# Patient Record
Sex: Female | Born: 1940
Health system: Southern US, Community
[De-identification: ages and names within clinical notes are randomized; demographics above are authoritative.]

## PROBLEM LIST (undated history)

## (undated) DIAGNOSIS — I10 Essential (primary) hypertension: Secondary | ICD-10-CM

## (undated) DIAGNOSIS — N189 Chronic kidney disease, unspecified: Secondary | ICD-10-CM

## (undated) DIAGNOSIS — E785 Hyperlipidemia, unspecified: Secondary | ICD-10-CM

## (undated) DIAGNOSIS — C801 Malignant (primary) neoplasm, unspecified: Secondary | ICD-10-CM

## (undated) HISTORY — DX: Chronic kidney disease, unspecified: N18.9

## (undated) HISTORY — DX: Malignant (primary) neoplasm, unspecified: C80.1

## (undated) HISTORY — DX: Hyperlipidemia, unspecified: E78.5

---

## 1999-05-22 ENCOUNTER — Ambulatory Visit (HOSPITAL_COMMUNITY): Admission: RE | Admit: 1999-05-22 | Discharge: 1999-05-22 | Payer: Self-pay | Admitting: Family Medicine

## 1999-05-22 ENCOUNTER — Encounter: Payer: Self-pay | Admitting: Family Medicine

## 1999-09-22 ENCOUNTER — Ambulatory Visit: Admission: RE | Admit: 1999-09-22 | Discharge: 1999-09-22 | Payer: Self-pay | Admitting: Cardiovascular Disease

## 1999-09-22 ENCOUNTER — Encounter: Payer: Self-pay | Admitting: Cardiovascular Disease

## 2003-07-02 ENCOUNTER — Other Ambulatory Visit: Admission: RE | Admit: 2003-07-02 | Discharge: 2003-07-02 | Payer: Self-pay | Admitting: Gynecology

## 2004-07-02 ENCOUNTER — Other Ambulatory Visit: Admission: RE | Admit: 2004-07-02 | Discharge: 2004-07-02 | Payer: Self-pay | Admitting: Gynecology

## 2010-08-19 ENCOUNTER — Other Ambulatory Visit: Payer: Self-pay

## 2010-08-20 ENCOUNTER — Ambulatory Visit (HOSPITAL_BASED_OUTPATIENT_CLINIC_OR_DEPARTMENT_OTHER): Payer: Self-pay | Admitting: Oncology

## 2010-08-25 ENCOUNTER — Encounter
Admission: RE | Admit: 2010-08-25 | Discharge: 2010-08-25 | Payer: Self-pay | Source: Home / Self Care | Attending: Diagnostic Radiology | Admitting: Diagnostic Radiology

## 2010-08-26 ENCOUNTER — Encounter (HOSPITAL_BASED_OUTPATIENT_CLINIC_OR_DEPARTMENT_OTHER): Payer: MEDICARE | Admitting: Oncology

## 2010-08-26 DIAGNOSIS — C50919 Malignant neoplasm of unspecified site of unspecified female breast: Secondary | ICD-10-CM

## 2010-08-26 LAB — CBC WITH DIFFERENTIAL/PLATELET
BASO%: 0.3 % (ref 0.0–2.0)
Basophils Absolute: 0 10*3/uL (ref 0.0–0.1)
EOS%: 1.9 % (ref 0.0–7.0)
Eosinophils Absolute: 0.1 10*3/uL (ref 0.0–0.5)
HCT: 43.1 % (ref 34.8–46.6)
HGB: 14.7 g/dL (ref 11.6–15.9)
LYMPH%: 25.3 % (ref 14.0–49.7)
MCH: 30.7 pg (ref 25.1–34.0)
MCHC: 34.2 g/dL (ref 31.5–36.0)
MCV: 89.8 fL (ref 79.5–101.0)
MONO#: 0.4 10*3/uL (ref 0.1–0.9)
MONO%: 8 % (ref 0.0–14.0)
NEUT#: 3.4 10*3/uL (ref 1.5–6.5)
NEUT%: 64.5 % (ref 38.4–76.8)
Platelets: 145 10*3/uL (ref 145–400)
RBC: 4.8 10*6/uL (ref 3.70–5.45)
RDW: 14.1 % (ref 11.2–14.5)
WBC: 5.2 10*3/uL (ref 3.9–10.3)
lymph#: 1.3 10*3/uL (ref 0.9–3.3)

## 2010-08-26 LAB — COMPREHENSIVE METABOLIC PANEL
ALT: 23 U/L (ref 0–35)
AST: 24 U/L (ref 0–37)
Albumin: 3.7 g/dL (ref 3.5–5.2)
Alkaline Phosphatase: 80 U/L (ref 39–117)
BUN: 22 mg/dL (ref 6–23)
CO2: 27 mEq/L (ref 19–32)
Calcium: 9.4 mg/dL (ref 8.4–10.5)
Chloride: 101 mEq/L (ref 96–112)
Creatinine, Ser: 1.37 mg/dL — ABNORMAL HIGH (ref 0.40–1.20)
Glucose, Bld: 385 mg/dL — ABNORMAL HIGH (ref 70–99)
Potassium: 3.9 mEq/L (ref 3.5–5.3)
Sodium: 139 mEq/L (ref 135–145)
Total Bilirubin: 0.8 mg/dL (ref 0.3–1.2)
Total Protein: 6.9 g/dL (ref 6.0–8.3)

## 2010-08-26 LAB — CANCER ANTIGEN 27.29: CA 27.29: 38 U/mL (ref 0–39)

## 2010-08-31 ENCOUNTER — Other Ambulatory Visit: Payer: Self-pay | Admitting: Gynecology

## 2010-10-05 ENCOUNTER — Encounter (HOSPITAL_BASED_OUTPATIENT_CLINIC_OR_DEPARTMENT_OTHER): Payer: MEDICARE | Admitting: Oncology

## 2010-10-05 ENCOUNTER — Other Ambulatory Visit: Payer: Self-pay | Admitting: Oncology

## 2010-10-05 DIAGNOSIS — C50919 Malignant neoplasm of unspecified site of unspecified female breast: Secondary | ICD-10-CM

## 2010-10-05 LAB — CBC WITH DIFFERENTIAL/PLATELET
BASO%: 0.3 % (ref 0.0–2.0)
Basophils Absolute: 0 10*3/uL (ref 0.0–0.1)
EOS%: 2.4 % (ref 0.0–7.0)
Eosinophils Absolute: 0.1 10*3/uL (ref 0.0–0.5)
HCT: 42.2 % (ref 34.8–46.6)
HGB: 14.6 g/dL (ref 11.6–15.9)
LYMPH%: 26.2 % (ref 14.0–49.7)
MCH: 30.4 pg (ref 25.1–34.0)
MCHC: 34.5 g/dL (ref 31.5–36.0)
MCV: 88.2 fL (ref 79.5–101.0)
MONO#: 0.3 10*3/uL (ref 0.1–0.9)
MONO%: 6.8 % (ref 0.0–14.0)
NEUT#: 3.2 10*3/uL (ref 1.5–6.5)
NEUT%: 64.3 % (ref 38.4–76.8)
Platelets: 156 10*3/uL (ref 145–400)
RBC: 4.79 10*6/uL (ref 3.70–5.45)
RDW: 13.4 % (ref 11.2–14.5)
WBC: 5 10*3/uL (ref 3.9–10.3)
lymph#: 1.3 10*3/uL (ref 0.9–3.3)

## 2010-10-06 LAB — COMPREHENSIVE METABOLIC PANEL
ALT: 21 U/L (ref 0–35)
AST: 17 U/L (ref 0–37)
Alkaline Phosphatase: 86 U/L (ref 39–117)
Calcium: 9.7 mg/dL (ref 8.4–10.5)
Chloride: 99 mEq/L (ref 96–112)
Creatinine, Ser: 1.04 mg/dL (ref 0.40–1.20)

## 2010-10-16 ENCOUNTER — Encounter (HOSPITAL_BASED_OUTPATIENT_CLINIC_OR_DEPARTMENT_OTHER): Payer: MEDICARE | Admitting: Oncology

## 2010-10-16 DIAGNOSIS — C50919 Malignant neoplasm of unspecified site of unspecified female breast: Secondary | ICD-10-CM

## 2010-11-25 ENCOUNTER — Encounter (HOSPITAL_BASED_OUTPATIENT_CLINIC_OR_DEPARTMENT_OTHER): Payer: MEDICARE | Admitting: Oncology

## 2010-11-25 ENCOUNTER — Other Ambulatory Visit: Payer: Self-pay | Admitting: Oncology

## 2010-11-25 DIAGNOSIS — C50919 Malignant neoplasm of unspecified site of unspecified female breast: Secondary | ICD-10-CM

## 2010-11-25 LAB — CBC WITH DIFFERENTIAL/PLATELET
Basophils Absolute: 0 10*3/uL (ref 0.0–0.1)
EOS%: 2.4 % (ref 0.0–7.0)
Eosinophils Absolute: 0.1 10*3/uL (ref 0.0–0.5)
HCT: 41.7 % (ref 34.8–46.6)
HGB: 14.4 g/dL (ref 11.6–15.9)
LYMPH%: 28.9 % (ref 14.0–49.7)
MCH: 30.6 pg (ref 25.1–34.0)
MCV: 88.7 fL (ref 79.5–101.0)
MONO%: 11.1 % (ref 0.0–14.0)
NEUT#: 2.5 10*3/uL (ref 1.5–6.5)
NEUT%: 57.1 % (ref 38.4–76.8)
Platelets: 156 10*3/uL (ref 145–400)

## 2010-11-26 LAB — COMPREHENSIVE METABOLIC PANEL
ALT: 20 U/L (ref 0–35)
CO2: 26 mEq/L (ref 19–32)
Calcium: 9.5 mg/dL (ref 8.4–10.5)
Chloride: 101 mEq/L (ref 96–112)
Sodium: 139 mEq/L (ref 135–145)
Total Bilirubin: 0.6 mg/dL (ref 0.3–1.2)
Total Protein: 6.8 g/dL (ref 6.0–8.3)

## 2010-11-26 LAB — LACTATE DEHYDROGENASE: LDH: 178 U/L (ref 94–250)

## 2010-12-03 ENCOUNTER — Encounter: Payer: MEDICARE | Admitting: Oncology

## 2010-12-04 ENCOUNTER — Encounter (INDEPENDENT_AMBULATORY_CARE_PROVIDER_SITE_OTHER): Payer: Self-pay | Admitting: Surgery

## 2011-01-06 ENCOUNTER — Other Ambulatory Visit: Payer: Self-pay | Admitting: Interventional Cardiology

## 2011-01-21 ENCOUNTER — Other Ambulatory Visit: Payer: Self-pay | Admitting: Oncology

## 2011-01-21 DIAGNOSIS — C50911 Malignant neoplasm of unspecified site of right female breast: Secondary | ICD-10-CM

## 2011-02-04 ENCOUNTER — Ambulatory Visit
Admission: RE | Admit: 2011-02-04 | Discharge: 2011-02-04 | Disposition: A | Discharge status: Home / Self Care | Payer: Medicare Other | Source: Ambulatory Visit | Attending: Oncology | Admitting: Oncology

## 2011-02-04 DIAGNOSIS — C50911 Malignant neoplasm of unspecified site of right female breast: Secondary | ICD-10-CM

## 2011-02-04 MED ORDER — GADOBENATE DIMEGLUMINE 529 MG/ML IV SOLN
20.0000 mL | Freq: Once | INTRAVENOUS | Status: AC | PRN
  Administered 2011-02-04: 20 mL via INTRAVENOUS

## 2011-02-08 ENCOUNTER — Encounter (INDEPENDENT_AMBULATORY_CARE_PROVIDER_SITE_OTHER): Payer: MEDICARE | Admitting: Surgery

## 2011-02-11 ENCOUNTER — Other Ambulatory Visit: Payer: Self-pay | Admitting: Oncology

## 2011-02-11 ENCOUNTER — Encounter (HOSPITAL_BASED_OUTPATIENT_CLINIC_OR_DEPARTMENT_OTHER): Payer: Medicare Other | Admitting: Oncology

## 2011-02-11 DIAGNOSIS — C50919 Malignant neoplasm of unspecified site of unspecified female breast: Secondary | ICD-10-CM

## 2011-02-11 LAB — CBC WITH DIFFERENTIAL/PLATELET
Basophils Absolute: 0 10*3/uL (ref 0.0–0.1)
EOS%: 2.6 % (ref 0.0–7.0)
HCT: 37.2 % (ref 34.8–46.6)
HGB: 13.1 g/dL (ref 11.6–15.9)
LYMPH%: 34 % (ref 14.0–49.7)
MCH: 31.2 pg (ref 25.1–34.0)
MCV: 88.9 fL (ref 79.5–101.0)
MONO%: 9.4 % (ref 0.0–14.0)
NEUT%: 53.4 % (ref 38.4–76.8)
Platelets: 143 10*3/uL — ABNORMAL LOW (ref 145–400)
lymph#: 1.4 10*3/uL (ref 0.9–3.3)

## 2011-02-11 LAB — CANCER ANTIGEN 27.29: CA 27.29: 29 U/mL (ref 0–39)

## 2011-02-11 LAB — COMPREHENSIVE METABOLIC PANEL
ALT: 23 U/L (ref 0–35)
Alkaline Phosphatase: 75 U/L (ref 39–117)
CO2: 22 mEq/L (ref 19–32)
Potassium: 3.6 mEq/L (ref 3.5–5.3)
Sodium: 139 mEq/L (ref 135–145)
Total Bilirubin: 0.3 mg/dL (ref 0.3–1.2)
Total Protein: 6.7 g/dL (ref 6.0–8.3)

## 2011-02-16 ENCOUNTER — Encounter (INDEPENDENT_AMBULATORY_CARE_PROVIDER_SITE_OTHER): Payer: Self-pay | Admitting: Surgery

## 2011-02-22 ENCOUNTER — Encounter (INDEPENDENT_AMBULATORY_CARE_PROVIDER_SITE_OTHER): Payer: Self-pay | Admitting: Surgery

## 2011-02-23 ENCOUNTER — Encounter (INDEPENDENT_AMBULATORY_CARE_PROVIDER_SITE_OTHER): Payer: Self-pay | Admitting: Surgery

## 2011-03-01 ENCOUNTER — Encounter (INDEPENDENT_AMBULATORY_CARE_PROVIDER_SITE_OTHER): Payer: Self-pay | Admitting: Surgery

## 2011-03-01 ENCOUNTER — Ambulatory Visit (INDEPENDENT_AMBULATORY_CARE_PROVIDER_SITE_OTHER): Payer: Medicare Other | Admitting: Surgery

## 2011-03-01 ENCOUNTER — Other Ambulatory Visit (INDEPENDENT_AMBULATORY_CARE_PROVIDER_SITE_OTHER): Payer: Self-pay | Admitting: Surgery

## 2011-03-01 VITALS — BP 124/85 | HR 72 | Temp 98.0°F | Ht 65.0 in | Wt 201.6 lb

## 2011-03-01 DIAGNOSIS — C50911 Malignant neoplasm of unspecified site of right female breast: Secondary | ICD-10-CM | POA: Insufficient documentation

## 2011-03-01 DIAGNOSIS — C50919 Malignant neoplasm of unspecified site of unspecified female breast: Secondary | ICD-10-CM

## 2011-03-01 DIAGNOSIS — D0590 Unspecified type of carcinoma in situ of unspecified breast: Secondary | ICD-10-CM

## 2011-03-01 NOTE — Progress Notes (Signed)
[name] is a 70 y.o. female.    Chief Complaint  Patient presents with  . Breast Cancer Long Term Follow Up    HPI HPI She is here for another followup of her right breast cancer. She had locally advanced lobular carcinoma and is currently on Femara. Most recently she had an MRI of the breast which shows her to have an extensive response with now the tumor in the tail of Spence measuring only 7 mm. There were no enlarged lymph nodes. She currently has no complaints. She continues to see Dr. Truddie Coco  Past Medical History  Diagnosis Date  . Cancer   . Diabetes mellitus   . Hyperlipidemia     History reviewed. No pertinent past surgical history.  History reviewed. No pertinent family history.  Social History History  Substance Use Topics  . Smoking status: Never Smoker   . Smokeless tobacco: Never Used  . Alcohol Use: No    No Known Allergies  Current Outpatient Prescriptions  Medication Sig Dispense Refill  . B Complex-C (SUPER B COMPLEX PO) Take by mouth.        . bisoprolol-hydrochlorothiazide (ZIAC) 10-6.25 MG per tablet Take 1 tablet by mouth daily.        . Calcium Carbonate (CALTRATE 600 PO) Take by mouth.        . calcium-vitamin D (OSCAL WITH D) 500-200 MG-UNIT per tablet Take 1 tablet by mouth daily.        . furosemide (LASIX) 40 MG tablet Take 40 mg by mouth 2 (two) times daily.        Marland Kitchen glimepiride (AMARYL) 2 MG tablet Take 2 mg by mouth daily before breakfast.        . insulin aspart protamine-insulin aspart (NOVOLOG 70/30) (70-30) 100 UNIT/ML injection Inject 30 Units into the skin.        . rosuvastatin (CRESTOR) 10 MG tablet Take 10 mg by mouth daily.        . valsartan (DIOVAN) 320 MG tablet Take 320 mg by mouth daily.          Review of Systems ROS Review of systems is currently negative for fever or chest pain or shortness of breath Physical Exam Physical Exam  On physical examination, she still has skin dimpling near the axilla in detail and  stents of the right breast. There are no other palpable masses. This area is definitely smaller. There is no palpable adenopathy. Blood pressure 124/85, pulse 72, temperature 98 F (36.7 C), temperature source Temporal, height 5\' 5"  (1.651 m), weight 201 lb 9.6 oz (91.445 kg).  Assessment/Plan This is a patient with invasive lobular carcinoma of the right breast was responding well to neoadjuvant therapy. We'll now proceed with a lumpectomy and sentinel node biopsy of this area. I discussed risks of surgery which include but are not limited to bleeding, infection, having positive margins with need for further surgery, injury just surrounding structures including nerves, et Ronney Asters. She agrees and wishes to proceed. Surgery will be scheduled.  Mitsue Peery A 03/01/2011, 11:17 AM

## 2011-03-15 ENCOUNTER — Ambulatory Visit (HOSPITAL_COMMUNITY)
Admission: RE | Admit: 2011-03-15 | Discharge: 2011-03-15 | Disposition: A | Discharge status: Home / Self Care | Payer: Medicare Other | Source: Ambulatory Visit | Attending: Surgery | Admitting: Surgery

## 2011-03-15 ENCOUNTER — Other Ambulatory Visit (INDEPENDENT_AMBULATORY_CARE_PROVIDER_SITE_OTHER): Payer: Self-pay | Admitting: Surgery

## 2011-03-15 ENCOUNTER — Encounter (HOSPITAL_COMMUNITY)
Admission: RE | Admit: 2011-03-15 | Discharge: 2011-03-15 | Disposition: A | Discharge status: Home / Self Care | Payer: Medicare Other | Source: Ambulatory Visit | Attending: Surgery | Admitting: Surgery

## 2011-03-15 DIAGNOSIS — C50911 Malignant neoplasm of unspecified site of right female breast: Secondary | ICD-10-CM

## 2011-03-15 DIAGNOSIS — Z01812 Encounter for preprocedural laboratory examination: Secondary | ICD-10-CM | POA: Insufficient documentation

## 2011-03-15 DIAGNOSIS — C50919 Malignant neoplasm of unspecified site of unspecified female breast: Secondary | ICD-10-CM | POA: Insufficient documentation

## 2011-03-15 DIAGNOSIS — Z01811 Encounter for preprocedural respiratory examination: Secondary | ICD-10-CM | POA: Insufficient documentation

## 2011-03-15 DIAGNOSIS — Z01810 Encounter for preprocedural cardiovascular examination: Secondary | ICD-10-CM | POA: Insufficient documentation

## 2011-03-15 LAB — SURGICAL PCR SCREEN
MRSA, PCR: NEGATIVE
Staphylococcus aureus: NEGATIVE

## 2011-03-15 LAB — BASIC METABOLIC PANEL
CO2: 29 mEq/L (ref 19–32)
Calcium: 10.4 mg/dL (ref 8.4–10.5)
Chloride: 97 mEq/L (ref 96–112)
Glucose, Bld: 360 mg/dL — ABNORMAL HIGH (ref 70–99)
Potassium: 3.8 mEq/L (ref 3.5–5.1)
Sodium: 138 mEq/L (ref 135–145)

## 2011-03-15 LAB — CBC
HCT: 42.8 % (ref 36.0–46.0)
MCHC: 35.3 g/dL (ref 30.0–36.0)
MCV: 88.1 fL (ref 78.0–100.0)
RDW: 13 % (ref 11.5–15.5)

## 2011-03-26 ENCOUNTER — Ambulatory Visit (HOSPITAL_COMMUNITY)
Admission: RE | Admit: 2011-03-26 | Discharge: 2011-03-26 | Disposition: A | Discharge status: Home / Self Care | Payer: Medicare Other | Source: Ambulatory Visit | Attending: Surgery | Admitting: Surgery

## 2011-03-26 ENCOUNTER — Other Ambulatory Visit (INDEPENDENT_AMBULATORY_CARE_PROVIDER_SITE_OTHER): Payer: Self-pay | Admitting: Surgery

## 2011-03-26 DIAGNOSIS — Z01812 Encounter for preprocedural laboratory examination: Secondary | ICD-10-CM | POA: Insufficient documentation

## 2011-03-26 DIAGNOSIS — C50919 Malignant neoplasm of unspecified site of unspecified female breast: Secondary | ICD-10-CM

## 2011-03-26 DIAGNOSIS — I10 Essential (primary) hypertension: Secondary | ICD-10-CM | POA: Insufficient documentation

## 2011-03-26 DIAGNOSIS — Z01818 Encounter for other preprocedural examination: Secondary | ICD-10-CM | POA: Insufficient documentation

## 2011-03-26 DIAGNOSIS — E119 Type 2 diabetes mellitus without complications: Secondary | ICD-10-CM | POA: Insufficient documentation

## 2011-03-26 DIAGNOSIS — C50911 Malignant neoplasm of unspecified site of right female breast: Secondary | ICD-10-CM

## 2011-03-26 DIAGNOSIS — Z01810 Encounter for preprocedural cardiovascular examination: Secondary | ICD-10-CM | POA: Insufficient documentation

## 2011-03-26 DIAGNOSIS — K056 Periodontal disease, unspecified: Secondary | ICD-10-CM | POA: Insufficient documentation

## 2011-03-26 DIAGNOSIS — N289 Disorder of kidney and ureter, unspecified: Secondary | ICD-10-CM | POA: Insufficient documentation

## 2011-03-26 HISTORY — PX: BREAST LUMPECTOMY: SHX2

## 2011-03-26 LAB — GLUCOSE, CAPILLARY: Glucose-Capillary: 139 mg/dL — ABNORMAL HIGH (ref 70–99)

## 2011-03-26 MED ORDER — TECHNETIUM TC 99M SULFUR COLLOID FILTERED
1.0000 | Freq: Once | INTRAVENOUS | Status: AC | PRN
  Administered 2011-03-26: 1 via INTRADERMAL

## 2011-04-02 NOTE — Op Note (Signed)
[name], [name] NO.:  [id number]  MEDICAL RECORD NO.:  [id number]  LOCATION:  [facility]SC                         FACILITY:  [facility]  PHYSICIAN:  Coralie Keens, M.D. DATE OF BIRTH:  11-Sep-1940  DATE OF PROCEDURE:  03/26/2011 DATE OF DISCHARGE:                              OPERATIVE REPORT   PREOPERATIVE DIAGNOSES:  Right breast cancer.  POSTOPERATIVE DIAGNOSIS:  Right breast cancer.  PROCEDURE: 1. Right breast lumpectomy. 2. Right sentinel lymph node biopsy with injection of blue dye.  SURGEON:  Coralie Keens, MD  ANESTHESIA:  General and 0.5% Marcaine.  ESTIMATED BLOOD LOSS:  Minimal.  INDICATIONS:  [name] is a 70 year old female who has diagnosed in January 2012 with a right breast cancer.  Biopsies revealed an invasive mammary cancer with lobular features.  She was treated with neoadjuvant therapy and has had a significant response.  The masses in the tail of Spence of the right breast.  Decision had been made to proceed now with right breast lumpectomy and sentinel node biopsy.  FINDINGS:  The patient was found to have three sentinel lymph nodes which I removed 2 were hot and blue, one was hot only.  These were sent to pathology for evaluation.  PROCEDURE IN DETAIL:  The patient was brought to the operating room, identified as [name].  She was placed supine on the operative table and general anesthesia was induced.  Her right breast and axilla were then prepped and draped in usual sterile fashion.  The patient had skin dimpling almost at the axilla from the previous mass.  I did an elliptical incision around this area of dimpling and took this down into the breast tissue.  Again, her mass was in the tail of Spence.  She had quite large breasts.  I performed a very large lumpectomy going all way down to the chest wall as well as platysma, removing all the tissue in this area, the tissue itself was normal in texture.  Once the  entire lumpectomy specimen was just above the removed, I identified the sentinel nodes, two were blue and hot and I removed these easily and sent to pathology for evaluation, I then proceed with removal of the rest of the breast tissue.  I then marked all quadrants of the tissue with Marcaine paint.  I then identified a third sentinel lymph nodes underneath the pectoralis muscle and removed this as well  and sent to pathology.  This one was hot but not blue.  I then thoroughly irrigated the wound with normal saline.  I placed surgical clips in the chest wall margins of the lumpectomy site.  Hemostasis appeared to be achieved.  I then anesthetized the skin with Marcaine.  I closed the subcutaneous tissue with interrupted 3-0 Vicryl sutures and closed the skin with running 4-0 Monocryl. Steri-Strips, gauze and tape were then applied.  The patient tolerated the procedure well.  All counts were correct at the end of the procedure.  The patient was then extubated in the operating and taken in stable condition to recovery room.Coralie Keens, M.D.     DB/MEDQ  D:  03/26/2011  T:  03/26/2011  Job:  [id number]  Electronically Signed by Coralie Keens M.D. on 04/02/2011 12:36:50 PM

## 2011-04-06 ENCOUNTER — Encounter (INDEPENDENT_AMBULATORY_CARE_PROVIDER_SITE_OTHER): Payer: Self-pay | Admitting: Surgery

## 2011-04-06 ENCOUNTER — Ambulatory Visit (INDEPENDENT_AMBULATORY_CARE_PROVIDER_SITE_OTHER): Payer: Medicare Other | Admitting: Surgery

## 2011-04-06 VITALS — BP 160/96 | HR 76

## 2011-04-06 DIAGNOSIS — Z09 Encounter for follow-up examination after completed treatment for conditions other than malignant neoplasm: Secondary | ICD-10-CM

## 2011-04-06 NOTE — Progress Notes (Signed)
Subjective:     Patient ID: [name], female   DOB: November 16, 1940, 70 y.o.   MRN: [id number]  HPI She is here for her first postoperative visit status post right breast lumpectomy and sentinel lymph node biopsy. Again, the lumpectomy was almost in the axilla. She has been having a lot of discomfort from a seroma formation. Otherwise she is without complaints.  Review of Systems     Objective:   Physical Exam On examination, she indeed has a large seroma in the right axilla. I drained approximately 500 cc of stromal fluid. There was no evidence of infection.  The final pathology demonstrated 3 of the 4 lymph node removed had metastatic cancer. The skin margin was positive but the rest of the margins were negative. Again she remained strongly ER and PR positive.    Assessment:     Patient status post right breast lumpectomy and central node biopsy for invasive lobular cancer.    Plan:     At this point, we will proceed to the operating room for reexcision of the skin margin. I suspect she will need an axillary dissection. She is however going to be having radiation to the breast. I'm going to discuss this with Dr. Truddie Coco and tentatively schedule the surgery. I discussed the risks with her.

## 2011-04-09 ENCOUNTER — Encounter (INDEPENDENT_AMBULATORY_CARE_PROVIDER_SITE_OTHER): Payer: Self-pay | Admitting: Surgery

## 2011-04-09 ENCOUNTER — Ambulatory Visit (INDEPENDENT_AMBULATORY_CARE_PROVIDER_SITE_OTHER): Payer: Medicare Other | Admitting: Surgery

## 2011-04-09 VITALS — BP 134/96 | HR 72 | Temp 97.0°F

## 2011-04-09 DIAGNOSIS — Z09 Encounter for follow-up examination after completed treatment for conditions other than malignant neoplasm: Secondary | ICD-10-CM

## 2011-04-09 NOTE — Progress Notes (Signed)
Subjective:     Patient ID: [name], female   DOB: Apr 14, 1941, 70 y.o.   MRN: [id number]  HPI  She is back today to drain the recurrent seroma in her right axilla. It is causing her to have discomfort again.Review of Systems     Objective:   Physical Exam On exam, she indeed has a recurrent seroma. I inserted an 18-gauge needle and drained several 100 cc of fluid. It did not appear infected.    Assessment:       Patient status post right breast lumpectomy and sentinel node biopsy for invasive lobular cancer. Again she was node positive. I've discussed this with Dr. Truddie Coco. I am going to re\re excise the skin margin which was positive. I am going to also proceed with a complete axillary dissection. Plan:     Surgery is planned for next Wednesday. She will call me Monday if her seroma is again symptomatic.

## 2011-04-12 ENCOUNTER — Encounter (INDEPENDENT_AMBULATORY_CARE_PROVIDER_SITE_OTHER): Payer: Medicare Other | Admitting: Surgery

## 2011-04-12 ENCOUNTER — Ambulatory Visit (INDEPENDENT_AMBULATORY_CARE_PROVIDER_SITE_OTHER): Payer: Medicare Other | Admitting: Surgery

## 2011-04-12 VITALS — BP 132/80 | HR 68

## 2011-04-12 DIAGNOSIS — Z09 Encounter for follow-up examination after completed treatment for conditions other than malignant neoplasm: Secondary | ICD-10-CM

## 2011-04-12 NOTE — Progress Notes (Signed)
Subjective:     Patient ID: [name], female   DOB: 1940-11-29, 70 y.o.   MRN: [id number]  HPI Her seroma in the right axilla is uncomfortable again  Review of Systems     Objective:   Physical Exam    I drained approximately 180 cc of seroma fluid Assessment:     Patient with right breast cancer status post excision    Plan:     Surgery is planned for Wednesday

## 2011-04-14 ENCOUNTER — Ambulatory Visit (HOSPITAL_COMMUNITY)
Admission: RE | Admit: 2011-04-14 | Discharge: 2011-04-15 | Disposition: A | Discharge status: Home / Self Care | Payer: Medicare Other | Source: Ambulatory Visit | Attending: Surgery | Admitting: Surgery

## 2011-04-14 ENCOUNTER — Other Ambulatory Visit (INDEPENDENT_AMBULATORY_CARE_PROVIDER_SITE_OTHER): Payer: Self-pay | Admitting: Surgery

## 2011-04-14 DIAGNOSIS — C50919 Malignant neoplasm of unspecified site of unspecified female breast: Secondary | ICD-10-CM | POA: Insufficient documentation

## 2011-04-14 DIAGNOSIS — C773 Secondary and unspecified malignant neoplasm of axilla and upper limb lymph nodes: Secondary | ICD-10-CM | POA: Insufficient documentation

## 2011-04-14 DIAGNOSIS — Z794 Long term (current) use of insulin: Secondary | ICD-10-CM | POA: Insufficient documentation

## 2011-04-14 DIAGNOSIS — E119 Type 2 diabetes mellitus without complications: Secondary | ICD-10-CM | POA: Insufficient documentation

## 2011-04-14 DIAGNOSIS — E669 Obesity, unspecified: Secondary | ICD-10-CM | POA: Insufficient documentation

## 2011-04-14 DIAGNOSIS — I10 Essential (primary) hypertension: Secondary | ICD-10-CM | POA: Insufficient documentation

## 2011-04-14 LAB — BASIC METABOLIC PANEL
Chloride: 102 mEq/L (ref 96–112)
Creatinine, Ser: 1.28 mg/dL — ABNORMAL HIGH (ref 0.50–1.10)
GFR calc Af Amer: 50 mL/min — ABNORMAL LOW (ref >60)
Sodium: 141 mEq/L (ref 135–145)

## 2011-04-14 LAB — GLUCOSE, CAPILLARY
Glucose-Capillary: 219 mg/dL — ABNORMAL HIGH (ref 70–99)
Glucose-Capillary: 208 mg/dL — ABNORMAL HIGH (ref 70–99)

## 2011-04-14 LAB — CBC
Hemoglobin: 14 g/dL (ref 12.0–15.0)
MCH: 30.7 pg (ref 26.0–34.0)
Platelets: 174 10*3/uL (ref 150–400)
RBC: 4.56 MIL/uL (ref 3.87–5.11)
WBC: 5.6 10*3/uL (ref 4.0–10.5)

## 2011-04-14 LAB — SURGICAL PCR SCREEN
MRSA, PCR: NEGATIVE
Staphylococcus aureus: NEGATIVE

## 2011-04-15 LAB — GLUCOSE, CAPILLARY
Glucose-Capillary: 93 mg/dL (ref 70–99)
Glucose-Capillary: 123 mg/dL — ABNORMAL HIGH (ref 70–99)

## 2011-04-20 ENCOUNTER — Encounter (HOSPITAL_BASED_OUTPATIENT_CLINIC_OR_DEPARTMENT_OTHER): Payer: Medicare Other | Admitting: Oncology

## 2011-04-20 ENCOUNTER — Ambulatory Visit (INDEPENDENT_AMBULATORY_CARE_PROVIDER_SITE_OTHER): Payer: Medicare Other | Admitting: Surgery

## 2011-04-20 ENCOUNTER — Other Ambulatory Visit: Payer: Self-pay | Admitting: Oncology

## 2011-04-20 ENCOUNTER — Encounter (INDEPENDENT_AMBULATORY_CARE_PROVIDER_SITE_OTHER): Payer: Self-pay | Admitting: Surgery

## 2011-04-20 VITALS — BP 152/98 | HR 70 | Temp 96.9°F | Resp 16 | Ht 64.5 in | Wt 203.4 lb

## 2011-04-20 DIAGNOSIS — Z170 Estrogen receptor positive status [ER+]: Secondary | ICD-10-CM

## 2011-04-20 DIAGNOSIS — C50919 Malignant neoplasm of unspecified site of unspecified female breast: Secondary | ICD-10-CM

## 2011-04-20 DIAGNOSIS — Z09 Encounter for follow-up examination after completed treatment for conditions other than malignant neoplasm: Secondary | ICD-10-CM

## 2011-04-20 LAB — COMPREHENSIVE METABOLIC PANEL
Albumin: 3.8 g/dL (ref 3.5–5.2)
BUN: 18 mg/dL (ref 6–23)
Calcium: 9.1 mg/dL (ref 8.4–10.5)
Chloride: 104 mEq/L (ref 96–112)
Glucose, Bld: 262 mg/dL — ABNORMAL HIGH (ref 70–99)
Potassium: 3.7 mEq/L (ref 3.5–5.3)

## 2011-04-20 LAB — CANCER ANTIGEN 27.29: CA 27.29: 28 U/mL (ref 0–39)

## 2011-04-20 LAB — CBC WITH DIFFERENTIAL/PLATELET
Basophils Absolute: 0 10*3/uL (ref 0.0–0.1)
Eosinophils Absolute: 0.1 10*3/uL (ref 0.0–0.5)
HCT: 38.4 % (ref 34.8–46.6)
HGB: 13.5 g/dL (ref 11.6–15.9)
MCV: 89.1 fL (ref 79.5–101.0)
NEUT#: 2.5 10*3/uL (ref 1.5–6.5)
RDW: 13 % (ref 11.2–14.5)
lymph#: 1.4 10*3/uL (ref 0.9–3.3)

## 2011-04-20 LAB — VITAMIN D 25 HYDROXY (VIT D DEFICIENCY, FRACTURES): Vit D, 25-Hydroxy: 41 ng/mL (ref 30–89)

## 2011-04-20 NOTE — Progress Notes (Signed)
Subjective:     Patient ID: [name], female   DOB: November 02, 1940, 70 y.o.   MRN: [id number]  HPI She is here for her first postoperative visit status post reexcision of the skin of the right breast lumpectomy site as well as completion axillary dissection. She is doing well and has no complaints. The drain is still draining a moderate amount of serous fluid  Review of Systems     Objective:   Physical Exam    On exam, the incision is healing well without evidence of infection. The drain site is stable. The drain fluid is serous sanguinous. Again, the pathology showed no residual carcinoma in the skin removed. The lymph nodes removed were negative for malignancy. Assessment:     Patient status post reexcision of invasive lobular carcinoma of the right breast    Plan:     I will see her back next week to hopefully remove the drain

## 2011-04-26 NOTE — Op Note (Signed)
[name], [name] NO.:  [id number]  MEDICAL RECORD NO.:  [id number]  LOCATION:  [facility]SC                         FACILITY:  [facility]  PHYSICIAN:  Coralie Keens, M.D. DATE OF BIRTH:  Nov 23, 1940  DATE OF PROCEDURE:  04/14/2011 DATE OF DISCHARGE:                              OPERATIVE REPORT   PREOPERATIVE DIAGNOSIS:  Right breast cancer.  POSTOPERATIVE DIAGNOSIS:  Right breast cancer.  PROCEDURE: 1. Re-excision of right breast cancer. 2. Completion of right axillary lymph node dissection.  SURGEON:  Coralie Keens, MD.  ANESTHESIA:  General and 0.25% Marcaine.  ESTIMATED BLOOD LOSS:  Minimal.  INDICATION:  [name] is a 70 year old female who was found to have an invasive lobular right breast cancer.  She underwent neoadjuvant therapy and presented for lumpectomy and sentinel node biopsy.  She had positive skin margins at the lumpectomy site and 3 out of 4 lymph nodes positive for metastatic cancer.  Decision was made to reexcise the skin site and to proceed with a completion of right axillary dissection.  PROCEDURE IN DETAIL:  The patient was brought to the operating, identified as [name].  She was placed supine on the operating room table and general anesthesia was induced.  Her right axilla and breast were then prepped and draped in usual sterile fashion.  I performed a large elliptical incision in the axilla of the breast.  Her breast cancer itself was in the upper outer quadrant in the tail of Spence right adjacent to the axilla.  I had done a previous sentinel node biopsy through this incision as well.  I widely excised the skin around the previous scar with the electrocautery and took this down to the breast tissue with the cautery.  Once the skin of the anterior margin was excised, marked it with sutures medial and lateral.  The long suture was lateral.  I then drained seroma out of the breast cavity.  The skin was sent to pathology  for evaluation.  The patient had a moderate amount of scarring in the axilla, but I was able to easily develop a tissue plane between the axilla and the pectoralis minor muscle.  The patient had a wide of tissue in the axilla.  I was able to easily identify the axillary vein as long as the long thoracodorsal nerves despite the inflammatory reaction from the previous surgery.  Several small bridging veins were clipped with surgical clips as I dissected out the nodal package.  Once I removed the nodal package in its entirety, I again evaluate the neurovascular structures and they were all felt to be intact.  I palpated the axilla as well as underneath the pectoralis muscle and found no other enlarged lymph nodes.  The axillary specimen was then sent to pathology for evaluation.  I then thoroughly irrigated the axilla with normal saline.  Again, hemostasis appeared to be achieved.  I made a separate skin incision and placed a 19-French Blake drain into the axilla.  This incorporated the previous lumpectomy site as well.  I then reapproximated the subcutaneous tissue with interrupted 2-0 Vicryl sutures, anesthetized the skin with Marcaine and then closed the skin with 4-0 Monocryl.  Steri-Strips and Tegaderm were then applied.  The patient tolerated the procedure well.  All counts were correct at the end of procedure.  The patient was then extubated in the operating room and taken in stable condition to recovery room.     Coralie Keens, M.D.     DB/MEDQ  D:  04/14/2011  T:  04/14/2011  Job:  [id number]  Electronically Signed by Coralie Keens M.D. on 04/26/2011 09:10:32 AM

## 2011-04-27 ENCOUNTER — Encounter (INDEPENDENT_AMBULATORY_CARE_PROVIDER_SITE_OTHER): Payer: Self-pay | Admitting: Surgery

## 2011-04-27 ENCOUNTER — Ambulatory Visit (INDEPENDENT_AMBULATORY_CARE_PROVIDER_SITE_OTHER): Payer: Medicare Other | Admitting: Surgery

## 2011-04-27 VITALS — BP 156/100 | HR 80 | Temp 98.2°F | Resp 16 | Ht 64.5 in | Wt 202.6 lb

## 2011-04-27 DIAGNOSIS — Z09 Encounter for follow-up examination after completed treatment for conditions other than malignant neoplasm: Secondary | ICD-10-CM

## 2011-04-27 NOTE — Progress Notes (Signed)
Subjective:     Patient ID: [name], female   DOB: Feb 27, 1941, 70 y.o.   MRN: [id number]  HPI She is here for another postoperative visit status post mastectomy and axillary dissection. She is now draining minimal fluid in the right axillary during  Review of Systems     Objective:   Physical Exam On exam, her incision is healing well. I removed the drain    Assessment:     Patient status post reexcision right breast cancer and axillary dissection    Plan:     I will see her back in one week

## 2011-05-05 ENCOUNTER — Encounter (INDEPENDENT_AMBULATORY_CARE_PROVIDER_SITE_OTHER): Payer: Self-pay | Admitting: Surgery

## 2011-05-05 ENCOUNTER — Ambulatory Visit (INDEPENDENT_AMBULATORY_CARE_PROVIDER_SITE_OTHER): Payer: Medicare Other | Admitting: Surgery

## 2011-05-05 VITALS — BP 140/94 | HR 80 | Temp 96.8°F | Resp 20 | Ht 64.75 in | Wt 206.0 lb

## 2011-05-05 DIAGNOSIS — Z09 Encounter for follow-up examination after completed treatment for conditions other than malignant neoplasm: Secondary | ICD-10-CM

## 2011-05-05 NOTE — Progress Notes (Signed)
Subjective:     Patient ID: [name], female   DOB: May 21, 1941, 70 y.o.   MRN: [id number]  HPI She is here for another postoperative visit. She feels well with minimal discomfort of the breast.  Review of Systems     Objective:   Physical Exam On exam, the left breast is slightly erythematous in appearance it is not warm. I did insert an 18-gauge needle into the biopsy site and found no fluid    Assessment:     Patient status post reexcision of right breast cancer and completion axillary dissection    Plan:     Given the slight erythema, I am going to start her on Keflex 500 mg p.o. t.i.d. for the next 7 days. I will see her back next week for recheck

## 2011-05-11 ENCOUNTER — Encounter (HOSPITAL_BASED_OUTPATIENT_CLINIC_OR_DEPARTMENT_OTHER): Payer: Medicare Other | Admitting: Oncology

## 2011-05-11 DIAGNOSIS — C50919 Malignant neoplasm of unspecified site of unspecified female breast: Secondary | ICD-10-CM

## 2011-05-11 DIAGNOSIS — K8020 Calculus of gallbladder without cholecystitis without obstruction: Secondary | ICD-10-CM

## 2011-05-11 DIAGNOSIS — N61 Mastitis without abscess: Secondary | ICD-10-CM

## 2011-05-12 ENCOUNTER — Ambulatory Visit (INDEPENDENT_AMBULATORY_CARE_PROVIDER_SITE_OTHER): Payer: Medicare Other | Admitting: Surgery

## 2011-05-12 VITALS — BP 160/88 | HR 70 | Temp 96.9°F | Resp 16 | Ht 64.25 in | Wt 203.4 lb

## 2011-05-12 DIAGNOSIS — Z09 Encounter for follow-up examination after completed treatment for conditions other than malignant neoplasm: Secondary | ICD-10-CM

## 2011-05-12 NOTE — Progress Notes (Signed)
Subjective:     Patient ID: [name], female   DOB: 09/26/1940, 70 y.o.   MRN: [id number]  HPI  She is here for a followup of erythema of her right breast. She is finishing her antibiotics. She has no complaints.Review of Systems     Objective:   Physical Exam On exam, erythematous and was completely resolved. There is no warmth to the breast. The breast is nontender    Assessment:     Patient status post right breast lumpectomy and axillary dissection with mild postoperative cellulitis    Plan:     I will see her back in 2 weeks and with erythema recurs

## 2011-05-25 ENCOUNTER — Encounter (INDEPENDENT_AMBULATORY_CARE_PROVIDER_SITE_OTHER): Payer: Self-pay | Admitting: Surgery

## 2011-05-31 ENCOUNTER — Encounter (INDEPENDENT_AMBULATORY_CARE_PROVIDER_SITE_OTHER): Payer: Self-pay | Admitting: Surgery

## 2011-05-31 ENCOUNTER — Ambulatory Visit (INDEPENDENT_AMBULATORY_CARE_PROVIDER_SITE_OTHER): Payer: Medicare Other | Admitting: Surgery

## 2011-05-31 VITALS — BP 138/88 | HR 66 | Temp 98.0°F | Resp 18 | Wt 205.0 lb

## 2011-05-31 DIAGNOSIS — Z09 Encounter for follow-up examination after completed treatment for conditions other than malignant neoplasm: Secondary | ICD-10-CM

## 2011-05-31 NOTE — Progress Notes (Signed)
Subjective:     Patient ID: [name], female   DOB: Apr 16, 1941, 70 y.o.   MRN: [id number]  HPI She is here for a followup visit. She reports she still has intermittent discomfort in the right breast and there still some mild erythema of the breast  Review of Systems     Objective:   Physical Exam On exam, there is slight induration in the axillary incision but no erythema. There is erythema and warmth of the lower breast    Assessment:     Right breast postop cellulitis    Plan:     I do believe there is a mild infection but some of the erythema may be secondary to the large size of her breast hanging. Nonetheless, I'm going to resume doxycycline and see her back in 2 weeks.

## 2011-06-01 ENCOUNTER — Telehealth: Payer: Self-pay | Admitting: Oncology

## 2011-06-01 ENCOUNTER — Ambulatory Visit (HOSPITAL_BASED_OUTPATIENT_CLINIC_OR_DEPARTMENT_OTHER): Payer: Medicare Other | Admitting: Oncology

## 2011-06-01 ENCOUNTER — Other Ambulatory Visit (HOSPITAL_BASED_OUTPATIENT_CLINIC_OR_DEPARTMENT_OTHER): Payer: Medicare Other | Admitting: Lab

## 2011-06-01 ENCOUNTER — Other Ambulatory Visit: Payer: Self-pay | Admitting: Oncology

## 2011-06-01 DIAGNOSIS — C50919 Malignant neoplasm of unspecified site of unspecified female breast: Secondary | ICD-10-CM

## 2011-06-01 DIAGNOSIS — Z170 Estrogen receptor positive status [ER+]: Secondary | ICD-10-CM

## 2011-06-01 LAB — CBC WITH DIFFERENTIAL/PLATELET
BASO%: 0.4 % (ref 0.0–2.0)
Basophils Absolute: 0 10*3/uL (ref 0.0–0.1)
EOS%: 2 % (ref 0.0–7.0)
HCT: 40.8 % (ref 34.8–46.6)
LYMPH%: 33.5 % (ref 14.0–49.7)
MCH: 30.6 pg (ref 25.1–34.0)
MCHC: 34.3 g/dL (ref 31.5–36.0)
MCV: 89.2 fL (ref 79.5–101.0)
MONO%: 8.1 % (ref 0.0–14.0)
NEUT%: 56 % (ref 38.4–76.8)
Platelets: 152 10*3/uL (ref 145–400)

## 2011-06-01 LAB — COMPREHENSIVE METABOLIC PANEL
ALT: 19 U/L (ref 0–35)
AST: 18 U/L (ref 0–37)
BUN: 20 mg/dL (ref 6–23)
Creatinine, Ser: 1.26 mg/dL — ABNORMAL HIGH (ref 0.50–1.10)
Total Bilirubin: 0.4 mg/dL (ref 0.3–1.2)

## 2011-06-01 NOTE — Progress Notes (Signed)
   Hematology and Oncology Follow Up Visit  LYZETTE SCHIAVO [id number] 1941/07/21 70 y.o. 06/01/2011 5:33 PM   Principle Diagnosis: Node positive ER/PR positive lobular cancer status post lumpectomy and node dissection  Interim History:  Ms. pearlee mustapha for followup she continues to see Dr. Ninfa Linden who is seeing her for a wound infection. He has been in touch with Dr. Alroy Dust in [place] who will see her  next week regarding radiation. I reviewed the recent Oncotype test which showed a recurrent score of 18 corresponding to a risk of recurrence of 11%. I have also reassured her that the relative benefit of chemotherapy in this setting is not great. She is reassured and is anxious to proceed  with radiation. sHe will continue on Femara for the time being while she is  having radiations  Medications: I have reviewed the patient's current medications.  Allergies: No Known Allergies  Past Medical History, Surgical history, Social history, and Family History were reviewed and updated.  Review of Systems: Constitutional:  Negative for fever, chills, night sweats, anorexia, weight loss, pain. Cardiovascular: no chest pain or dyspnea on exertion Respiratory: no cough, shortness of breath, or wheezing Neurological: negative Dermatological: negative ENT: negative Skin Gastrointestinal: negative Genito-Urinary: negative Hematological and Lymphatic: negative Breast: negative for breast lumps Musculoskeletal: negative Remaining ROS negative.  Physical Exam: Blood pressure 175/93, pulse 70, temperature 98.4 F (36.9 C), temperature source Oral, height 5' 4.5" (1.638 m), weight 204 lb 4.8 oz (92.67 kg). ECOG: 0 General appearance: alert, Pt not fully examined Head:  Pulmonary: Breasts: Abdomen: Extremities Neuro:  Lab Results: Lab Results  Component Value Date   WBC 5.6 04/14/2011   HGB 14.0 06/01/2011   HCT 40.8 06/01/2011   MCV 89.2 06/01/2011   PLT 152 06/01/2011     Chemistry     Component Value Date/Time   NA 141 06/01/2011 0859   K 4.3 06/01/2011 0859   CL 102 06/01/2011 0859   CO2 26 06/01/2011 0859   BUN 20 06/01/2011 0859   CREATININE 1.26* 06/01/2011 0859      Component Value Date/Time   CALCIUM 9.8 06/01/2011 0859   ALKPHOS 90 06/01/2011 0859   AST 18 06/01/2011 0859   ALT 19 06/01/2011 0859   BILITOT 0.4 06/01/2011 0859       Radiological Studies: chest X-ray  Mammogram  Bone density   Impression and Plan: Once again I have emphasized that the overall prognosis is excellent. We will see her in 3 months time after she completes radiation. I have wished her well.  More than 50% of the visit was spent in patient-related counselling   Eston Esters, MD 11/6/20125:33 PM

## 2011-06-01 NOTE — Telephone Encounter (Signed)
mailed pts appt [email or web address] on 06/01/2011

## 2011-06-01 NOTE — Progress Notes (Signed)
    Hematology and Oncology Follow Up Visit  [name] [id number] 08/20/1940 70 y.o. 06/01/2011 5:44 PM   Principle Diagnosis: Node positive ER/PR positive lobular cancer status post lumpectomy and node dissection  Interim History:  [name] for followup she continues to see Dr. Ninfa Linden who is seeing her for a wound infection. He has been in touch with Dr. Alroy Dust in [place] who will see her  next week regarding radiation. I reviewed the recent Oncotype test which showed a recurrent score of 18 corresponding to a risk of recurrence of 11%. I have also reassured her that the relative benefit of chemotherapy in this setting is not great. She is reassured and is anxious to proceed  with radiation. sHe will continue on Femara for the time being while she is  having radiations  Medications: I have reviewed the patient's current medications.  Allergies: No Known Allergies  Past Medical History, Surgical history, Social history, and Family History were reviewed and updated.  Review of Systems: Constitutional:  Negative for fever, chills, night sweats, anorexia, weight loss, pain. Cardiovascular: no chest pain or dyspnea on exertion Respiratory: no cough, shortness of breath, or wheezing Neurological: negative Dermatological: negative ENT: negative Skin Gastrointestinal: negative Genito-Urinary: negative Hematological and Lymphatic: negative Breast: negative for breast lumps Musculoskeletal: negative Remaining ROS negative.  Physical Exam: Blood pressure 175/93, pulse 70, temperature 98.4 F (36.9 C), temperature source Oral, height 5' 4.5" (1.638 m), weight 204 lb 4.8 oz (92.67 kg). ECOG: 0 General appearance: alert, Pt not fully examined Head:  Pulmonary: Breasts: Abdomen: Extremities Neuro:  Lab Results: Lab Results  Component Value Date   WBC 5.6 04/14/2011   HGB 14.0 06/01/2011   HCT 40.8 06/01/2011   MCV 89.2 06/01/2011   PLT 152 06/01/2011     Chemistry      Component Value Date/Time   NA 141 06/01/2011 0859   K 4.3 06/01/2011 0859   CL 102 06/01/2011 0859   CO2 26 06/01/2011 0859   BUN 20 06/01/2011 0859   CREATININE 1.26* 06/01/2011 0859      Component Value Date/Time   CALCIUM 9.8 06/01/2011 0859   ALKPHOS 90 06/01/2011 0859   AST 18 06/01/2011 0859   ALT 19 06/01/2011 0859   BILITOT 0.4 06/01/2011 0859       Radiological Studies: chest X-ray  Mammogram  Bone density   Impression and Plan: Once again I have emphasized that the overall prognosis is excellent. We will see her in 3 months time after she completes radiation. I have wished her well.  More than 50% of the visit was spent in patient-related counselling   Eston Esters, MD 11/6/20125:44 PM

## 2011-06-02 ENCOUNTER — Telehealth: Payer: Self-pay | Admitting: *Deleted

## 2011-06-14 ENCOUNTER — Encounter (INDEPENDENT_AMBULATORY_CARE_PROVIDER_SITE_OTHER): Payer: Self-pay | Admitting: Surgery

## 2011-06-14 ENCOUNTER — Ambulatory Visit (INDEPENDENT_AMBULATORY_CARE_PROVIDER_SITE_OTHER): Payer: Medicare Other | Admitting: Surgery

## 2011-06-14 VITALS — BP 140/86 | HR 60 | Temp 97.8°F | Resp 16 | Ht 64.0 in | Wt 201.5 lb

## 2011-06-14 DIAGNOSIS — Z09 Encounter for follow-up examination after completed treatment for conditions other than malignant neoplasm: Secondary | ICD-10-CM

## 2011-06-14 NOTE — Progress Notes (Signed)
Subjective:     Patient ID: [name], female   DOB: 05-25-1941, 70 y.o.   MRN: [id number]  HPI She started her radiation today. She just finished her antibiotics. She has no complaints.  Review of Systems     Objective:   Physical Exam There is still some erythema of the breast and mild induration in the axilla. She has had no fevers    Assessment:     Patient status post right breast lumpectomy and sentinel node biopsy is now receiving radiation therapy    Plan:     At this point, we will not restart antibiotics. I will see her back in 4 weeks for recheck

## 2011-06-15 ENCOUNTER — Encounter (INDEPENDENT_AMBULATORY_CARE_PROVIDER_SITE_OTHER): Payer: Medicare Other | Admitting: Surgery

## 2011-06-23 ENCOUNTER — Ambulatory Visit: Payer: Medicare Other

## 2011-06-23 ENCOUNTER — Ambulatory Visit: Payer: Medicare Other | Admitting: Radiation Oncology

## 2011-07-12 ENCOUNTER — Encounter (INDEPENDENT_AMBULATORY_CARE_PROVIDER_SITE_OTHER): Payer: Self-pay | Admitting: Surgery

## 2011-07-12 ENCOUNTER — Ambulatory Visit (INDEPENDENT_AMBULATORY_CARE_PROVIDER_SITE_OTHER): Payer: Medicare Other | Admitting: Surgery

## 2011-07-12 VITALS — BP 156/82 | HR 68 | Temp 98.3°F | Resp 18 | Ht 64.0 in | Wt 201.0 lb

## 2011-07-12 DIAGNOSIS — Z09 Encounter for follow-up examination after completed treatment for conditions other than malignant neoplasm: Secondary | ICD-10-CM

## 2011-07-12 NOTE — Progress Notes (Signed)
Subjective:     Patient ID: [name], female   DOB: 1941-07-16, 70 y.o.   MRN: [id number]  HPI She is here for a followup of her right breast cancer. She is currently undergoing her radiation therapy. She complains of discomfort of the breast. She has been off antibiotics for over a month  Review of Systems     Objective:   Physical Exam On exam, there is erythematous post radiation changes of the breast but no obvious infection or abscess    Assessment:     Patient with right breast cancer status post lumpectomy and axillary Dissection now undergoing radiation therapy    Plan:     She will continue her current care. I will see her back in 3 months

## 2011-08-30 ENCOUNTER — Other Ambulatory Visit: Payer: Self-pay

## 2011-08-30 DIAGNOSIS — C50919 Malignant neoplasm of unspecified site of unspecified female breast: Secondary | ICD-10-CM

## 2011-08-31 ENCOUNTER — Telehealth: Payer: Self-pay | Admitting: *Deleted

## 2011-08-31 ENCOUNTER — Other Ambulatory Visit (HOSPITAL_BASED_OUTPATIENT_CLINIC_OR_DEPARTMENT_OTHER): Payer: Medicare Other | Admitting: Lab

## 2011-08-31 ENCOUNTER — Ambulatory Visit (HOSPITAL_BASED_OUTPATIENT_CLINIC_OR_DEPARTMENT_OTHER): Payer: Medicare Other | Admitting: Oncology

## 2011-08-31 VITALS — BP 155/88 | HR 77 | Temp 98.4°F | Ht 64.0 in | Wt 196.3 lb

## 2011-08-31 DIAGNOSIS — C50919 Malignant neoplasm of unspecified site of unspecified female breast: Secondary | ICD-10-CM

## 2011-08-31 DIAGNOSIS — Z923 Personal history of irradiation: Secondary | ICD-10-CM

## 2011-08-31 DIAGNOSIS — D0590 Unspecified type of carcinoma in situ of unspecified breast: Secondary | ICD-10-CM

## 2011-08-31 DIAGNOSIS — Z170 Estrogen receptor positive status [ER+]: Secondary | ICD-10-CM

## 2011-08-31 DIAGNOSIS — Z79811 Long term (current) use of aromatase inhibitors: Secondary | ICD-10-CM

## 2011-08-31 DIAGNOSIS — E559 Vitamin D deficiency, unspecified: Secondary | ICD-10-CM

## 2011-08-31 LAB — CBC WITH DIFFERENTIAL/PLATELET
BASO%: 0.1 % (ref 0.0–2.0)
Basophils Absolute: 0 10*3/uL (ref 0.0–0.1)
HCT: 41.8 % (ref 34.8–46.6)
HGB: 14.5 g/dL (ref 11.6–15.9)
LYMPH%: 13.6 % — ABNORMAL LOW (ref 14.0–49.7)
MCH: 30.6 pg (ref 25.1–34.0)
MCHC: 34.8 g/dL (ref 31.5–36.0)
MONO#: 0.3 10*3/uL (ref 0.1–0.9)
NEUT%: 77.4 % — ABNORMAL HIGH (ref 38.4–76.8)
Platelets: 170 10*3/uL (ref 145–400)
WBC: 4.8 10*3/uL (ref 3.9–10.3)
lymph#: 0.6 10*3/uL — ABNORMAL LOW (ref 0.9–3.3)

## 2011-08-31 LAB — COMPREHENSIVE METABOLIC PANEL
ALT: 22 U/L (ref 0–35)
BUN: 22 mg/dL (ref 6–23)
CO2: 22 mEq/L (ref 19–32)
Calcium: 9.7 mg/dL (ref 8.4–10.5)
Chloride: 97 mEq/L (ref 96–112)
Creatinine, Ser: 1.23 mg/dL — ABNORMAL HIGH (ref 0.50–1.10)
Total Bilirubin: 0.6 mg/dL (ref 0.3–1.2)

## 2011-08-31 NOTE — Telephone Encounter (Signed)
gave patient appointments for 02-2012 patient stated she would make her mammogram appointment herself at [facility]

## 2011-08-31 NOTE — Progress Notes (Signed)
   Hematology and Oncology Follow Up Visit  [name] [id number] 1941-04-09 71 y.o. 08/31/2011 10:01 AM  Dr Ninfa Linden; Dr Alroy Dust; Dr Bertram Millard  Principle Diagnosis: Node positive ER/PR positive lobular cancer status post lumpectomy and node dissection, s/p xrt completed 08/04/11. Oncotype score 18  Interim History:  [name] for followup , she did well with her xrt in [place]. She has some tenderness in hjer breast, but no ROM issues. She is tolerating femara well.  Medications: I have reviewed the patient's current medications.  Allergies: No Known Allergies  Past Medical History, Surgical history, Social history, and Family History were reviewed and updated.  Review of Systems: Constitutional:  Negative for fever, chills, night sweats, anorexia, weight loss, pain. Cardiovascular: no chest pain or dyspnea on exertion Respiratory: no cough, shortness of breath, or wheezing Neurological: negative Dermatological: negative ENT: negative Skin Gastrointestinal: negative Genito-Urinary: negative Hematological and Lymphatic: negative Breast: negative for breast lumps Musculoskeletal: negative Remaining ROS negative.  Physical Exam: Blood pressure 155/88, pulse 77, temperature 98.4 F (36.9 C), height 5\' 4"  (1.626 m), weight 196 lb 4.8 oz (89.041 kg). ECOG: 0 General appearance: alert, Head: normal Pulmonary: Breasts:rt breast , sl erythema, sero,ma in rt axilla.lt breast and axilla normal. Abdomen: normal Extremities normal Neuro:normal  Lab Results: Lab Results  Component Value Date   WBC 4.8 08/31/2011   HGB 14.5 08/31/2011   HCT 41.8 08/31/2011   MCV 88.1 08/31/2011   PLT 170 08/31/2011     Chemistry      Component Value Date/Time   NA 141 06/01/2011 0859   K 4.3 06/01/2011 0859   CL 102 06/01/2011 0859   CO2 26 06/01/2011 0859   BUN 20 06/01/2011 0859   CREATININE 1.26* 06/01/2011 0859      Component Value Date/Time   CALCIUM 9.8 06/01/2011 0859   ALKPHOS 90  06/01/2011 0859   AST 18 06/01/2011 0859   ALT 19 06/01/2011 0859   BILITOT 0.4 06/01/2011 0859       Radiological Studies: chest X-ray N/a  Mammogram Due 5/13  Bone density Performed last yr   Impression and Plan: [name] is dpoing well i have rnewed her femara rx. I will see her in 6 months or 1/yr if dr Ninfa Linden continues to see her. I have scheduled her f/u mammogram.  More than 50% of the visit was spent in patient-related counselling   Eston Esters, MD 2/5/201310:01 AM

## 2011-09-07 ENCOUNTER — Other Ambulatory Visit: Payer: Medicare Other | Admitting: Lab

## 2011-09-07 ENCOUNTER — Ambulatory Visit: Payer: Medicare Other | Admitting: Oncology

## 2011-09-17 NOTE — Telephone Encounter (Signed)
XXXX 

## 2011-10-19 ENCOUNTER — Encounter (INDEPENDENT_AMBULATORY_CARE_PROVIDER_SITE_OTHER): Payer: Self-pay | Admitting: Surgery

## 2011-10-19 ENCOUNTER — Ambulatory Visit (INDEPENDENT_AMBULATORY_CARE_PROVIDER_SITE_OTHER): Payer: Medicare Other | Admitting: Surgery

## 2011-10-19 VITALS — BP 156/98 | HR 76 | Temp 97.8°F | Resp 18 | Ht 65.0 in | Wt 195.8 lb

## 2011-10-19 DIAGNOSIS — Z853 Personal history of malignant neoplasm of breast: Secondary | ICD-10-CM

## 2011-10-19 NOTE — Progress Notes (Signed)
Subjective:     Patient ID: [name], female   DOB: October 31, 1940, 71 y.o.   MRN: [id number]  HPI She is here for long-term followup of her right breast cancer. Other than mild discomfort in the breast from time to time she is doing well.  Review of Systems     Objective:   Physical Exam    The right breast was examined. There is firmness in the incision at the axilla but no discrete masses. The rest of breast has very mild erythema but no palpable masses Assessment:     Long-term followup right breast cancer    Plan:     She will continue on Femara. I will see her back in 6 months. I encouraged her to go ahead and schedule her mammograms. Dr. Truddie Coco and I will start alternating visits.

## 2011-11-12 ENCOUNTER — Telehealth: Payer: Self-pay | Admitting: *Deleted

## 2011-11-12 NOTE — Telephone Encounter (Signed)
per conversation with patient surgeon asked her to wait till after the surgery to see dr.rubin

## 2011-12-03 ENCOUNTER — Encounter (INDEPENDENT_AMBULATORY_CARE_PROVIDER_SITE_OTHER): Payer: Self-pay

## 2012-02-29 ENCOUNTER — Other Ambulatory Visit: Payer: Medicare Other | Admitting: Lab

## 2012-02-29 ENCOUNTER — Ambulatory Visit: Payer: Medicare Other | Admitting: Oncology

## 2012-04-04 ENCOUNTER — Encounter (INDEPENDENT_AMBULATORY_CARE_PROVIDER_SITE_OTHER): Payer: Self-pay | Admitting: Surgery

## 2012-04-04 ENCOUNTER — Ambulatory Visit (INDEPENDENT_AMBULATORY_CARE_PROVIDER_SITE_OTHER): Payer: Medicare Other | Admitting: Surgery

## 2012-04-04 VITALS — BP 142/90 | HR 78 | Temp 98.2°F | Resp 18 | Ht 64.0 in | Wt 191.8 lb

## 2012-04-04 DIAGNOSIS — Z853 Personal history of malignant neoplasm of breast: Secondary | ICD-10-CM

## 2012-04-04 NOTE — Progress Notes (Signed)
Subjective:     Patient ID: [name], female   DOB: Aug 11, 1940, 71 y.o.   MRN: [id number]  HPI She is here for long-term followup of her right breast cancer. She had invasive lobular carcinoma. She had neoadjuvant therapy followed by lumpectomy and complete axillary dissection. She is on anti-hormonal therapy. She has no new complaints regarding her breast. She still has some discomfort in the axilla and some numbness.  Review of Systems     Objective:   Physical Exam    there still mild erythema of the breast. There is a firm scar which he reports is actually softer. There are no palpable masses Assessment:     Long-term followup right breast cancer    Plan:     Her last mammograms were unremarkable. I will see her back in one year

## 2012-11-03 ENCOUNTER — Encounter: Payer: Self-pay | Admitting: Oncology

## 2012-11-03 ENCOUNTER — Telehealth: Payer: Self-pay | Admitting: *Deleted

## 2012-11-03 NOTE — Telephone Encounter (Signed)
Pt called in question of who her new provider would be and she requested Dr. Humphrey Rolls.  Confirmed  11/20/12 appt w/ pt.  Mailed letter & calendar to pt.

## 2012-11-20 ENCOUNTER — Ambulatory Visit (HOSPITAL_BASED_OUTPATIENT_CLINIC_OR_DEPARTMENT_OTHER): Payer: Medicare Other | Admitting: Gynecologic Oncology

## 2012-11-20 ENCOUNTER — Encounter: Payer: Self-pay | Admitting: Gynecologic Oncology

## 2012-11-20 ENCOUNTER — Telehealth: Payer: Self-pay | Admitting: *Deleted

## 2012-11-20 VITALS — BP 163/80 | HR 76 | Temp 98.1°F | Resp 20 | Ht 64.0 in | Wt 190.1 lb

## 2012-11-20 DIAGNOSIS — C50419 Malignant neoplasm of upper-outer quadrant of unspecified female breast: Secondary | ICD-10-CM

## 2012-11-20 DIAGNOSIS — Z170 Estrogen receptor positive status [ER+]: Secondary | ICD-10-CM

## 2012-11-20 DIAGNOSIS — Z780 Asymptomatic menopausal state: Secondary | ICD-10-CM

## 2012-11-20 DIAGNOSIS — Z853 Personal history of malignant neoplasm of breast: Secondary | ICD-10-CM

## 2012-11-20 DIAGNOSIS — E119 Type 2 diabetes mellitus without complications: Secondary | ICD-10-CM

## 2012-11-20 NOTE — Progress Notes (Signed)
OFFICE PROGRESS NOTE  CC Humphrey Rolls, [place]: PCP Lance Sell: GYN Dr. Ninfa Linden: [facility] Dermatologist, opthalmology  DIAGNOSIS:  [name] is a 72 year old [place] woman, who palpated a right breast mass in late December 2012 along with skin retraction.  On 08/03/10, she had a mammogram, which showed a 3.4 cm area of density involving the skin and the axillary crease on the right breast.  An ultrasound was performed showing a 2 cm mass and a core needle biopsy was recommended.  Core needle biopsy was performed on 08/19/2010, which showed a invasive mammary carcinoma with lobular features, ER 95%, PR 94%, Ki67 6%, HER2 negative at 0.85.  An MRI of the bilateral breast was performed on 08/25/2010, which showed a 4.8 x 4.1 x 4.0 cm mass in the upper outer quadrant of the right breast extending to the skin and with associated skin dimpling.  No suspicious axillary and internal mammary lymph nodes were identified.  No suspicious abnormalities were noted in the left breast.   PRIOR THERAPY:  In February 2012, she began Letrozole 2.5 mg daily for neoadjuvant therapy.  A repeat MRI in 01/2011 resulted near complete resolution of abnormal enhancement in the right  axillary tail compatible with tumor treatment response.  On 03/26/11, she underwent a right lumpectomy with sentinel lymph node biopsy by Dr. Ninfa Linden with final pathology resulting invasive lobular carcinoma, 4.5cm, invasive tumor present at the anterior margin, no lymphovascular invasion, and three of four lymph nodes sampled positive for metastatic mammary carcinoma.  On 04/14/11, she underwent re-excision of the right breast with lymph node biopsy with final pathology revealing no residual carcinoma, negative margins, and four of four nodes negative for carcinoma.  Her Oncotype score was 18.  She completed radiation in [place] under the care of Dr. Alroy Dust with completion in 07/2011.  She had continued on Femara since February 2012 and has tolerated it  well since.  CURRENT THERAPY:  Femara 2.5 mg daily.  INTERVAL HISTORY: [name] 72 y.o. female returns for continued follow up.  She reports tolerating Femara well, with no hot flashes or vaginal dryness.  She reports intermittent joint aches, which she relates to her arthritis.  She states that she was told in 2005 that she would need a right knee replacement but she has done well without surgery since that time.  She reports that she knows her limitations and she uses caution.  She reports that she is due for a bone density scan, with her last in 08/2010.  She reports issues with her blood sugar being elevated.  Her recent lab work on 10/16/12 resulted triglycerides at 470, glucose at 402, and Hgb A1C of 14.9.  She states that she is working closely with Dr. Sarina Ser, her PCP, for improve her glycemic control.  She states that she is trying to increase her activity and make appropriate dietary selections.  She recently had a pre-cancerous lesion removed from the auricle of her left ear and has noticed that it is healing slowly.  She states that she is concerned at how long it has taken her to heal after her lumpectomy and radiation.  The erythema of the right breast after radiation has improved per the patient.  No other concerns voiced.  MEDICAL HISTORY: Past Medical History  Diagnosis Date  . Diabetes mellitus   . Hyperlipidemia   . Cancer     ALLERGIES:  has No Known Allergies.  MEDICATIONS:  Current Outpatient Prescriptions  Medication Sig Dispense Refill  .  atorvastatin (LIPITOR) 40 MG tablet Take 40 mg by mouth daily.      . B Complex-C (SUPER B COMPLEX PO) Take 1,000 mg by mouth daily.       . bisoprolol-hydrochlorothiazide (ZIAC) 10-6.25 MG per tablet Take 1 tablet by mouth daily.        . Calcium Carbonate (CALTRATE 600 PO) Take 1,200 mg by mouth 2 (two) times daily.       . Cholecalciferol (VITAMIN D) 2000 UNITS tablet Take 4,000 Units by mouth daily.      . furosemide  (LASIX) 40 MG tablet Take 40 mg by mouth as needed.       . insulin lispro protamine-lispro (HUMALOG 75/25) (75-25) 100 UNIT/ML SUSP Inject 40 Units into the skin 2 (two) times daily with a meal.      . letrozole (FEMARA) 2.5 MG tablet daily.      . metFORMIN (GLUCOPHAGE-XR) 750 MG 24 hr tablet Take 750 mg by mouth 2 (two) times daily.      Marland Kitchen olmesartan (BENICAR) 40 MG tablet Take 40 mg by mouth daily.      . fluticasone (FLONASE) 50 MCG/ACT nasal spray as needed.       No current facility-administered medications for this visit.    SURGICAL HISTORY:  Past Surgical History  Procedure Laterality Date  . Breast lumpectomy  03/26/2011    right    REVIEW OF SYSTEMS:  Constitutional: Feels well.  No fever or chills.  Cardiovascular: No chest pain, shortness of breath, or edema.  Pulmonary: No cough or wheeze.  Gastrointestinal: No nausea, vomiting, or diarrhea. No bright red blood per rectum or change in bowel movement.  Genitourinary: No frequency, urgency, or dysuria. No vaginal bleeding or discharge.  Musculoskeletal: Intermittent right knee soreness.  No myalgia. Neurologic: No weakness, numbness, or change in gait.  Psychology: No depression, anxiety, or insomnia.  HEALTH MAINTENANCE: Mammogram: 12/01/2011 Colonoscopy:  04/2012 with one polyp removed, for repeat in 5 years Bone Density: 09/01/2010 at [facility] Pap Smear: 05/2012 Eye Exam: Yearly Vitamin D: 09/2012 Lipid Panel: 09/2012  PHYSICAL EXAMINATION: Blood pressure 163/80, pulse 76, temperature 98.1 F (36.7 C), temperature source Oral, resp. rate 20, height 5\' 4"  (1.626 m), weight 190 lb 1.6 oz (86.229 kg). Body mass index is 32.61 kg/(m^2). ECOG PERFORMANCE STATUS: 0 - Asymptomatic  General: Well developed, well nourished female in no acute distress. Alert and oriented x 3.  Head/ Neck: Oropharynx clear.  Sclerae anicteric.  Supple without any enlargements.  Lymph node survey: No cervical, supraclavicular, or axillary  adenopathy.  Cardiovascular: Regular rate and rhythm. S1 and S2 normal.  Lungs: Clear to auscultation bilaterally. No wheezes/crackles/rhonchi noted.  Skin: No rashes or lesions present.  Healing post-biopsy site on the auricle of the left ear.  No drainage or erythema. Back: No CVA tenderness.  Abdomen: Abdomen soft, non-tender and obese. Active bowel sounds in all quadrants. No evidence of a fluid wave or abdominal masses.  Breasts: Inspection negative with no nodularity, masses, or discharge noted bilaterally.  Right breast with mild erythema related to radiation changes.  Right breast examined by Dr. Humphrey Rolls also. Extremities: No bilateral cyanosis, edema, or clubbing.    LABORATORY DATA: Lab Results  Component Value Date   WBC 4.8 08/31/2011   HGB 14.5 08/31/2011   HCT 41.8 08/31/2011   MCV 88.1 08/31/2011   PLT 170 08/31/2011      Chemistry      Component Value Date/Time   NA 135 08/31/2011  0925   K 3.7 08/31/2011 0925   CL 97 08/31/2011 0925   CO2 22 08/31/2011 0925   BUN 22 08/31/2011 0925   CREATININE 1.23* 08/31/2011 0925      Component Value Date/Time   CALCIUM 9.7 08/31/2011 0925   ALKPHOS 106 08/31/2011 0925   AST 22 08/31/2011 0925   ALT 22 08/31/2011 0925   BILITOT 0.6 08/31/2011 0925      RADIOGRAPHIC STUDIES:  No results found.  ASSESSMENT:  72 year old [place] woman:  #1  Biopsy of the right breast on 08/19/2010, which showed a invasive mammary carcinoma with lobular features, ER 95%, PR 94%, Ki67 6%, HER2 negative at 0.85.  An MRI of the bilateral breast was performed on 08/25/2010, which showed a 4.8 x 4.1 x 4.0 cm mass in the upper outer quadrant of the right breast extending to the skin and with associated skin dimpling.  No suspicious axillary and internal mammary lymph nodes were identified.  No suspicious abnormalities were noted in the left breast.   #2  In February 2012, she began Letrozole 2.5 mg daily for neoadjuvant therapy.   #3  A repeat MRI in 01/2011 resulted near  complete resolution of abnormal enhancement in the right axillary tail compatible with tumor treatment response.    #4  On 03/26/11, she underwent a right lumpectomy with sentinel lymph node biopsy by Dr. Ninfa Linden for ypT2 ypN1a invasive lobular carcinoma, 4.5 cm, invasive tumor present at the anterior margin, no lymphovascular invasion, and three of four lymph nodes sampled positive for metastatic mammary carcinoma.    #5  On 04/14/11, she underwent re-excision of the right breast with lymph node biopsy with final pathology revealing no residual carcinoma, negative margins, and four of four nodes negative for carcinoma.    #6  She completed radiation in [place] under the care of Dr. Alroy Dust with completion in 07/2011.    #7  She had continued on Femara since February 2012 and has tolerated it well since.   PLAN:  She is to continue taking Femara as prescribed.  She is to have her mammogram and bone density scan in May 2014.  She is to continue her efforts with improving her glycemic control and increasing her physical activity.  She is advised to follow up with Dr. Humphrey Rolls in December 2014 or sooner if needed.      All questions were answered. The patient knows to call the clinic with any problems, questions or concerns. We can certainly see the patient much sooner if necessary.  I spent 30 minutes counseling the patient face to face and Dr. Humphrey Rolls spoke with the patient for ten minutes about future plans and recommendations. The total time spent in the appointment was 60 minutes.   Joylene John, NP Medical/Oncology [facility] [phone] (Office)  11/20/2012, 2:59 PM

## 2012-11-20 NOTE — Telephone Encounter (Signed)
gv appt for bone density and mammogram scheduled at [facility] for 12/04/12. Pt is aware that her bone density starts @ 10:30am and mammogram follows @ 11:15AM..Marland KitchenTD

## 2012-11-20 NOTE — Telephone Encounter (Signed)
appts made and printed...td 

## 2012-11-20 NOTE — Patient Instructions (Addendum)
Doing well.  Continue Femara as prescribed.  Plan to follow up with Dr. Humphrey Rolls in November or December 2014 or sooner if problems arise.  Please call for any questions or concerns.

## 2012-12-05 ENCOUNTER — Encounter (INDEPENDENT_AMBULATORY_CARE_PROVIDER_SITE_OTHER): Payer: Self-pay

## 2012-12-08 ENCOUNTER — Encounter (INDEPENDENT_AMBULATORY_CARE_PROVIDER_SITE_OTHER): Payer: Self-pay

## 2013-04-09 ENCOUNTER — Ambulatory Visit (INDEPENDENT_AMBULATORY_CARE_PROVIDER_SITE_OTHER): Payer: Medicare Other | Admitting: Surgery

## 2013-04-27 ENCOUNTER — Encounter (INDEPENDENT_AMBULATORY_CARE_PROVIDER_SITE_OTHER): Payer: Self-pay | Admitting: Surgery

## 2013-04-27 ENCOUNTER — Ambulatory Visit (INDEPENDENT_AMBULATORY_CARE_PROVIDER_SITE_OTHER): Payer: Medicare Other | Admitting: Surgery

## 2013-04-27 VITALS — BP 142/86 | HR 70 | Temp 98.2°F | Resp 14 | Ht 64.0 in | Wt 177.4 lb

## 2013-04-27 DIAGNOSIS — Z853 Personal history of malignant neoplasm of breast: Secondary | ICD-10-CM

## 2013-04-27 NOTE — Progress Notes (Signed)
Subjective:     Patient ID: [name], female   DOB: March 03, 1941, 73 y.o.   MRN: [id number]  HPI She is here for long-term followup of her right breast lobular carcinoma. I saw her last approximately year ago. She finished radiation in January of this year. Other than mild chronic erythema of the breast she has no complaints  Review of Systems     Objective:   Physical Exam  on exam, her incision remains well healed there the axilla. It is softer the last year. There is no actually adenopathy and no palpable masses. Her mammogram in April of 2014 was unremarkable    Assessment:     Long-term history of right breast lobular cancer     Plan:     She will continue her and to hormonal therapy and self exams. She will continue to be followed by the cancer center. I will see her back in one year

## 2013-06-25 ENCOUNTER — Ambulatory Visit (HOSPITAL_BASED_OUTPATIENT_CLINIC_OR_DEPARTMENT_OTHER): Payer: Medicare Other | Admitting: Oncology

## 2013-06-25 ENCOUNTER — Encounter: Payer: Self-pay | Admitting: Oncology

## 2013-06-25 ENCOUNTER — Telehealth: Payer: Self-pay | Admitting: Oncology

## 2013-06-25 VITALS — BP 148/85 | HR 71 | Temp 98.2°F | Resp 18 | Ht 64.0 in | Wt 174.8 lb

## 2013-06-25 DIAGNOSIS — C50911 Malignant neoplasm of unspecified site of right female breast: Secondary | ICD-10-CM

## 2013-06-25 DIAGNOSIS — C50419 Malignant neoplasm of upper-outer quadrant of unspecified female breast: Secondary | ICD-10-CM

## 2013-06-25 DIAGNOSIS — Z170 Estrogen receptor positive status [ER+]: Secondary | ICD-10-CM

## 2013-06-25 NOTE — Progress Notes (Signed)
OFFICE PROGRESS NOTE  CC Humphrey Rolls, [place]: PCP Lance Sell: GYN Dr. Ninfa Linden: [facility] Dermatologist, opthalmology  DIAGNOSIS:  [name] is a 72 year old [place] woman, who palpated a right breast mass in late December 2012 along with skin retraction.    PRIOR THERAPY:    #1On 08/03/10, she had a mammogram, which showed a 3.4 cm area of density involving the skin and the axillary crease on the right breast.  An ultrasound was performed showing a 2 cm mass and a core needle biopsy was recommended.  Core needle biopsy was performed on 08/19/2010, which showed a invasive mammary carcinoma with lobular features, ER 95%, PR 94%, Ki67 6%, HER2 negative at 0.85.  An MRI of the bilateral breast was performed on 08/25/2010, which showed a 4.8 x 4.1 x 4.0 cm mass in the upper outer quadrant of the right breast extending to the skin and with associated skin dimpling.  No suspicious axillary and internal mammary lymph nodes were identified.  No suspicious abnormalities were noted in the left breast.   #2 In February 2012, she began Letrozole 2.5 mg daily for neoadjuvant therapy.  A repeat MRI in 01/2011 resulted near complete resolution of abnormal enhancement in the right axillary tail compatible with tumor treatment response.  On 03/26/11, she underwent a right lumpectomy with sentinel lymph node biopsy by Dr. Ninfa Linden with final pathology resulting invasive lobular carcinoma, 4.5cm, invasive tumor present at the anterior margin, no lymphovascular invasion, and three of four lymph nodes sampled positive for metastatic mammary carcinoma.  On 04/14/11, she underwent re-excision of the right breast with lymph node biopsy with final pathology revealing no residual carcinoma, negative margins, and four of four nodes negative for carcinoma.   #3 Her Oncotype score was 18.  She completed radiation in [place] under the care of Dr. Alroy Dust with completion in 07/2011.  She had continued on Femara since February 2012 and  has tolerated it well since.  CURRENT THERAPY:  Femara 2.5 mg daily.  INTERVAL HISTORY: [name] 72 y.o. female returns for continued follow up.  She reports tolerating Femara well, with no hot flashes or vaginal dryness.  She reports intermittent joint aches, which she relates to her arthritis. She states that she is trying to increase her activity and make appropriate dietary selections.  She recently had a pre-cancerous lesion removed from the auricle of her left ear and has noticed that it is healing slowly.  She states that she is concerned at how long it has taken her to heal after her lumpectomy and radiation.  The erythema of the right breast after radiation has improved per the patient.  No other concerns voiced.  MEDICAL HISTORY: Past Medical History  Diagnosis Date  . Diabetes mellitus   . Hyperlipidemia   . Cancer     ALLERGIES:  has No Known Allergies.  MEDICATIONS:  Current Outpatient Prescriptions  Medication Sig Dispense Refill  . atorvastatin (LIPITOR) 40 MG tablet Take 40 mg by mouth daily.      . B Complex-C (SUPER B COMPLEX PO) Take 1,000 mg by mouth daily.       . bisoprolol-hydrochlorothiazide (ZIAC) 10-6.25 MG per tablet Take 1 tablet by mouth daily.        . Calcium Carbonate (CALTRATE 600 PO) Take 1,200 mg by mouth 2 (two) times daily.       . Cholecalciferol (VITAMIN D) 2000 UNITS tablet Take 4,000 Units by mouth daily.      . fluticasone (FLONASE) 50 MCG/ACT  nasal spray as needed.      . furosemide (LASIX) 40 MG tablet Take 40 mg by mouth as needed.       . insulin lispro protamine-lispro (HUMALOG 75/25) (75-25) 100 UNIT/ML SUSP Inject 40 Units into the skin 2 (two) times daily with a meal.      . letrozole (FEMARA) 2.5 MG tablet daily.      . metFORMIN (GLUCOPHAGE-XR) 750 MG 24 hr tablet Take 750 mg by mouth 2 (two) times daily.      Marland Kitchen olmesartan (BENICAR) 40 MG tablet Take 40 mg by mouth daily.      Alveda Reasons 15 MG TABS tablet Take 20 mg by mouth  daily.        No current facility-administered medications for this visit.    SURGICAL HISTORY:  Past Surgical History  Procedure Laterality Date  . Breast lumpectomy  03/26/2011    right    REVIEW OF SYSTEMS:  Constitutional: Feels well.  No fever or chills.  Cardiovascular: No chest pain, shortness of breath, or edema.  Pulmonary: No cough or wheeze.  Gastrointestinal: No nausea, vomiting, or diarrhea. No bright red blood per rectum or change in bowel movement.  Genitourinary: No frequency, urgency, or dysuria. No vaginal bleeding or discharge.  Musculoskeletal: Intermittent right knee soreness.  No myalgia. Neurologic: No weakness, numbness, or change in gait.  Psychology: No depression, anxiety, or insomnia.  HEALTH MAINTENANCE: Mammogram: 12/01/2011 Colonoscopy:  04/2012 with one polyp removed, for repeat in 5 years Bone Density: 09/01/2010 at [facility] Pap Smear: 05/2012 Eye Exam: Yearly Vitamin D: 09/2012 Lipid Panel: 09/2012  PHYSICAL EXAMINATION: Blood pressure 148/85, pulse 71, temperature 98.2 F (36.8 C), temperature source Oral, resp. rate 18, height 5\' 4"  (1.626 m), weight 174 lb 12.8 oz (79.289 kg). Body mass index is 29.99 kg/(m^2). ECOG PERFORMANCE STATUS: 0 - Asymptomatic  General: Well developed, well nourished female in no acute distress. Alert and oriented x 3.  Head/ Neck: Oropharynx clear.  Sclerae anicteric.  Supple without any enlargements.  Lymph node survey: No cervical, supraclavicular, or axillary adenopathy.  Cardiovascular: Regular rate and rhythm. S1 and S2 normal.  Lungs: Clear to auscultation bilaterally. No wheezes/crackles/rhonchi noted.  Skin: No rashes or lesions present.  Healing post-biopsy site on the auricle of the left ear.  No drainage or erythema. Back: No CVA tenderness.  Abdomen: Abdomen soft, non-tender and obese. Active bowel sounds in all quadrants. No evidence of a fluid wave or abdominal masses.  Breasts: Inspection negative  with no nodularity, masses, or discharge noted bilaterally.  Right breast with mild erythema related to radiation changes.  Right breast examined by Dr. Humphrey Rolls also. Extremities: No bilateral cyanosis, edema, or clubbing.    LABORATORY DATA: Lab Results  Component Value Date   WBC 4.8 08/31/2011   HGB 14.5 08/31/2011   HCT 41.8 08/31/2011   MCV 88.1 08/31/2011   PLT 170 08/31/2011      Chemistry      Component Value Date/Time   NA 135 08/31/2011 0925   K 3.7 08/31/2011 0925   CL 97 08/31/2011 0925   CO2 22 08/31/2011 0925   BUN 22 08/31/2011 0925   CREATININE 1.23* 08/31/2011 0925      Component Value Date/Time   CALCIUM 9.7 08/31/2011 0925   ALKPHOS 106 08/31/2011 0925   AST 22 08/31/2011 0925   ALT 22 08/31/2011 0925   BILITOT 0.6 08/31/2011 0925      RADIOGRAPHIC STUDIES:  No results found.  ASSESSMENT:  72 year old [place] woman:  #1  Biopsy of the right breast on 08/19/2010, which showed a invasive mammary carcinoma with lobular features, ER 95%, PR 94%, Ki67 6%, HER2 negative at 0.85.  An MRI of the bilateral breast was performed on 08/25/2010, which showed a 4.8 x 4.1 x 4.0 cm mass in the upper outer quadrant of the right breast extending to the skin and with associated skin dimpling.  No suspicious axillary and internal mammary lymph nodes were identified.  No suspicious abnormalities were noted in the left breast.   #2  In February 2012, she began Letrozole 2.5 mg daily for neoadjuvant therapy.   #3  A repeat MRI in 01/2011 resulted near complete resolution of abnormal enhancement in the right axillary tail compatible with tumor treatment response.    #4  On 03/26/11, she underwent a right lumpectomy with sentinel lymph node biopsy by Dr. Ninfa Linden for ypT2 ypN1a invasive lobular carcinoma, 4.5 cm, invasive tumor present at the anterior margin, no lymphovascular invasion, and three of four lymph nodes sampled positive for metastatic mammary carcinoma.    #5  On 04/14/11, she underwent  re-excision of the right breast with lymph node biopsy with final pathology revealing no residual carcinoma, negative margins, and four of four nodes negative for carcinoma.    #6  She completed radiation in [place] under the care of Dr. Alroy Dust with completion in 07/2011.    #7  She had continued on Femara since February 2012 and has tolerated it well since.   PLAN:     #1 continue femara 2.5 mg daily, total of 5 years of therapy is planned, no evidence of recurrent disease  #2. We will see her back in 6 months for follow up  All questions were answered. The patient knows to call the clinic with any problems, questions or concerns. We can certainly see the patient much sooner if necessary.  I spent 15 minutes counseling the patient face to face.The total time spent in the appointment was 30 minutes.  Marcy Panning, MD Medical/Oncology [facility] [phone] (beeper) [phone] (Office)  06/25/2013, 11:24 AM

## 2013-06-25 NOTE — Telephone Encounter (Signed)
, °

## 2013-12-11 ENCOUNTER — Telehealth: Payer: Self-pay | Admitting: Hematology and Oncology

## 2013-12-11 NOTE — Telephone Encounter (Signed)
, °

## 2013-12-31 ENCOUNTER — Ambulatory Visit: Payer: Medicare Other | Admitting: Oncology

## 2013-12-31 ENCOUNTER — Other Ambulatory Visit: Payer: Medicare Other

## 2014-01-09 ENCOUNTER — Other Ambulatory Visit: Payer: Self-pay

## 2014-01-09 MED ORDER — LETROZOLE 2.5 MG PO TABS
2.5000 mg | ORAL_TABLET | Freq: Every day | ORAL | Status: DC
Start: 2014-01-09 — End: 2015-04-14

## 2014-01-09 NOTE — Telephone Encounter (Signed)
Pt left msg needing refill on letrozole since follow up appt pushed out to Aug with Gudena.  Refill for 90 days no refills sent to [facility] - Receipt confirmed.  Per pt - ok to lv msg on her voice mail.  Attempted call with no answer.

## 2014-01-09 NOTE — Telephone Encounter (Signed)
Let pt know prescription had been sent to [facility].  Pt voiced understanding.

## 2014-03-11 ENCOUNTER — Telehealth: Payer: Self-pay

## 2014-03-11 NOTE — Telephone Encounter (Signed)
Per pt request - ok to bring labs from 7/13 PCP visit to appt with Dr. Lindi Adie 8/18.  Lab appt cancelled.

## 2014-03-12 ENCOUNTER — Encounter: Payer: Self-pay | Admitting: Hematology and Oncology

## 2014-03-12 ENCOUNTER — Telehealth: Payer: Self-pay | Admitting: Hematology and Oncology

## 2014-03-12 ENCOUNTER — Ambulatory Visit (HOSPITAL_BASED_OUTPATIENT_CLINIC_OR_DEPARTMENT_OTHER): Payer: Medicare Other | Admitting: Hematology and Oncology

## 2014-03-12 ENCOUNTER — Other Ambulatory Visit: Payer: Medicare Other

## 2014-03-12 VITALS — BP 149/72 | HR 73 | Temp 98.2°F | Resp 20 | Ht 64.0 in | Wt 168.0 lb

## 2014-03-12 DIAGNOSIS — Z170 Estrogen receptor positive status [ER+]: Secondary | ICD-10-CM

## 2014-03-12 DIAGNOSIS — C773 Secondary and unspecified malignant neoplasm of axilla and upper limb lymph nodes: Secondary | ICD-10-CM

## 2014-03-12 DIAGNOSIS — C50911 Malignant neoplasm of unspecified site of right female breast: Secondary | ICD-10-CM

## 2014-03-12 DIAGNOSIS — C50619 Malignant neoplasm of axillary tail of unspecified female breast: Secondary | ICD-10-CM

## 2014-03-12 NOTE — Progress Notes (Signed)
Patient Care Team: [name], MD as PCP - General (Family Medicine)  DIAGNOSIS: Stage 3 Invasive lobular cancer  SUMMARY OF ONCOLOGIC HISTORY:   Lobular carcinoma of breast   08/03/2010 Mammogram 3.4 cm area of density involving the skin and the axillary crease on the right breast.  An ultrasound was performed showing a 2 cm mass    08/19/2010 Initial Diagnosis invasive mammary carcinoma with lobular features, ER 95%, PR 94%, Ki67 6%, HER2 negative at 0.85   08/25/2010 Breast MRI 8 x 4.1 x 4.0 cm mass in the upper outer quadrant of the right breast extending to the skin and with associated skin dimpling   08/26/2010 - 01/24/2011 Anti-estrogen oral therapy Letrozole 2.5 mg daily for neoadjuvant therapy   01/24/2011 Breast MRI complete resolution of abnormal enhancement in the right axillary tail compatible with tumor treatment response   03/26/2011 Surgery R Lumpectomy; Inv lobular cancer 4.5 cm with tumor at anterior margin, 3/4 LN positive; Reexcision no additional tumor. 4/4 LN positive (7/8 total); Oncotype 18. DId not need chemotherapy    - 07/27/2011 Radiation Therapy Radiation in [place]    CHIEF COMPLIANT:  Routine followup of her breast cancer. INTERVAL HISTORY:  [name], who goes by [name] is here for a six-month followup. She reports to be doing well without any major problems or concerns she denies any headaches lightheadedness blurred vision nausea vomiting. She denies any lumps or nodules. She is taking care of her grandchildren and that keeps her busy. She is usually tired in the day. No other major concerns today.  REVIEW OF SYSTEMS:   Constitutional: Denies fevers, chills or abnormal weight loss Eyes: Denies blurriness of vision Ears, nose, mouth, throat, and face: Denies mucositis or sore throat Respiratory: Denies cough, dyspnea or wheezes Cardiovascular: Denies palpitation, chest discomfort or lower extremity swelling Gastrointestinal:  Denies nausea, heartburn or change in bowel  habits Skin: Denies abnormal skin rashes Lymphatics: Denies new lymphadenopathy or easy bruising Neurological:Denies numbness, tingling or new weaknesses Behavioral/Psych: Mood is stable, no new changes  Breast: Denies any nodules her lungs were All other systems were reviewed with the patient and are negative.  I have reviewed the past medical history, past surgical history, social history and family history with the patient and they are unchanged from previous note.  ALLERGIES:  has No Known Allergies.  MEDICATIONS:  Current Outpatient Prescriptions  Medication Sig Dispense Refill  . atorvastatin (LIPITOR) 40 MG tablet Take 40 mg by mouth daily.      . B Complex-C (SUPER B COMPLEX PO) Take 1,000 mg by mouth daily.       . bisoprolol-hydrochlorothiazide (ZIAC) 10-6.25 MG per tablet Take 1 tablet by mouth daily.        . Calcium Carbonate (CALTRATE 600 PO) Take 1,200 mg by mouth 2 (two) times daily.       . Cholecalciferol (VITAMIN D) 2000 UNITS tablet Take 4,000 Units by mouth daily.      . fluticasone (FLONASE) 50 MCG/ACT nasal spray as needed.      . furosemide (LASIX) 40 MG tablet Take 40 mg by mouth as needed.       Marland Kitchen letrozole (FEMARA) 2.5 MG tablet Take 1 tablet (2.5 mg total) by mouth daily.  90 tablet  0  . LEVEMIR FLEXTOUCH 100 UNIT/ML Pen       . metFORMIN (GLUCOPHAGE-XR) 750 MG 24 hr tablet Take 750 mg by mouth 2 (two) times daily.      Marland Kitchen olmesartan (BENICAR)  40 MG tablet Take 40 mg by mouth daily.       No current facility-administered medications for this visit.    PHYSICAL EXAMINATION: ECOG PERFORMANCE STATUS: 1 - Symptomatic but completely ambulatory  Filed Vitals:   03/12/14 1005  BP: 149/72  Pulse: 73  Temp: 98.2 F (36.8 C)  Resp: 20   Filed Weights   03/12/14 1005  Weight: 168 lb (76.204 kg)    GENERAL:alert, no distress and comfortable SKIN: skin color, texture, turgor are normal, no rashes or significant lesions EYES: normal, Conjunctiva are pink  and non-injected, sclera clear OROPHARYNX:no exudate, no erythema and lips, buccal mucosa, and tongue normal  NECK: supple, thyroid normal size, non-tender, without nodularity LYMPH:  no palpable lymphadenopathy in the cervical, axillary or inguinal LUNGS: clear to auscultation and percussion with normal breathing effort HEART: regular rate & rhythm and no murmurs and no lower extremity edema ABDOMEN:abdomen soft, non-tender and normal bowel sounds Musculoskeletal:no cyanosis of digits and no clubbing  NEURO: alert & oriented x 3 with fluent speech, no focal motor/sensory deficits BREAST: No palpable masses lungs or nodules in either right or left breasts. No palpable axillary supraclavicular or infraclavicular adenopathy no breast tenderness or nipple discharge.   LABORATORY DATA:  I have reviewed the data as listed No visits with results within 1 Month(s) from this visit. Latest known visit with results is:  Appointment on 08/31/2011  Component Date Value Ref Range Status  . WBC 08/31/2011 4.8  3.9 - 10.3 10e3/uL Final  . NEUT# 08/31/2011 3.7  1.5 - 6.5 10e3/uL Final  . HGB 08/31/2011 14.5  11.6 - 15.9 g/dL Final  . HCT 08/31/2011 41.8  34.8 - 46.6 % Final  . Platelets 08/31/2011 170  145 - 400 10e3/uL Final  . MCV 08/31/2011 88.1  79.5 - 101.0 fL Final  . MCH 08/31/2011 30.6  25.1 - 34.0 pg Final  . MCHC 08/31/2011 34.8  31.5 - 36.0 g/dL Final  . RBC 08/31/2011 4.75  3.70 - 5.45 10e6/uL Final  . RDW 08/31/2011 14.0  11.2 - 14.5 % Final  . lymph# 08/31/2011 0.6* 0.9 - 3.3 10e3/uL Final  . MONO# 08/31/2011 0.3  0.1 - 0.9 10e3/uL Final  . Eosinophils Absolute 08/31/2011 0.1  0.0 - 0.5 10e3/uL Final  . Basophils Absolute 08/31/2011 0.0  0.0 - 0.1 10e3/uL Final  . NEUT% 08/31/2011 77.4* 38.4 - 76.8 % Final  . LYMPH% 08/31/2011 13.6* 14.0 - 49.7 % Final  . MONO% 08/31/2011 6.9  0.0 - 14.0 % Final  . EOS% 08/31/2011 2.0  0.0 - 7.0 % Final  . BASO% 08/31/2011 0.1  0.0 - 2.0 % Final   . Sodium 08/31/2011 135  135 - 145 mEq/L Final  . Potassium 08/31/2011 3.7  3.5 - 5.3 mEq/L Final  . Chloride 08/31/2011 97  96 - 112 mEq/L Final  . CO2 08/31/2011 22  19 - 32 mEq/L Final  . Glucose, Bld 08/31/2011 476* 70 - 99 mg/dL Final  . BUN 08/31/2011 22  6 - 23 mg/dL Final  . Creatinine, Ser 08/31/2011 1.23* 0.50 - 1.10 mg/dL Final  . Total Bilirubin 08/31/2011 0.6  0.3 - 1.2 mg/dL Final  . Alkaline Phosphatase 08/31/2011 106  39 - 117 U/L Final  . AST 08/31/2011 22  0 - 37 U/L Final  . ALT 08/31/2011 22  0 - 35 U/L Final  . Total Protein 08/31/2011 6.8  6.0 - 8.3 g/dL Final  . Albumin 08/31/2011 4.2  3.5 -  5.2 g/dL Final  . Calcium 08/31/2011 9.7  8.4 - 10.5 mg/dL Final    RADIOGRAPHIC STUDIES: I have personally reviewed the radiology reports and agreed with their findings. No results found.   ASSESSMENT & PLAN:  Lobular carcinoma of breast 1. Right breast invasive lobular carcinoma status post neoadjuvant hormonal therapy followed by lumpectomy and radiation therapy. Patient had extensive lymph node involvement 7/8 positive lymph nodes. At that time she Oncotype DX that revealed low risk disease and she preferred to go on observation. She remains on antiestrogen therapy and is tolerating it extremely well without any problems or concerns. Occasional hot flashes.   2. She had osteopenia previously documented and currently takes calcium and vitamin D. She is staying active taking care of her grandchildren but no structured physical activity or exercise. I encouraged her to consider walking for 30 minutes every day. Patient does not have any clinical evidence of breast cancer relapse based on physical exam and mammograms. I recommended continued watchful waiting and monitoring.   Return to clinic in 6 months for followup.   No orders of the defined types were placed in this encounter.   The patient has a good understanding of the overall plan. she agrees with it. She will call  with any problems that may develop before her next visit here.  I spent 25 minutes counseling the patient face to face. The total time spent in the appointment was 30 minutes and more than 50% was on counseling and review of test results    Rulon Eisenmenger, MD 03/12/2014 2:22 PM

## 2014-03-12 NOTE — Telephone Encounter (Signed)
per pof to sch pt appt-gave pt copy of sch °

## 2014-03-12 NOTE — Assessment & Plan Note (Signed)
1. Right breast invasive lobular carcinoma status post neoadjuvant hormonal therapy followed by lumpectomy and radiation therapy. Patient had extensive lymph node involvement 7/8 positive lymph nodes. At that time she Oncotype DX that revealed low risk disease and she preferred to go on observation. She remains on antiestrogen therapy and is tolerating it extremely well without any problems or concerns. Occasional hot flashes.   2. She had osteopenia previously documented and currently takes calcium and vitamin D. She is staying active taking care of her grandchildren but no structured physical activity or exercise. I encouraged her to consider walking for 30 minutes every day. Patient does not have any clinical evidence of breast cancer relapse based on physical exam and mammograms. I recommended continued watchful waiting and monitoring.   Return to clinic in 6 months for followup.

## 2014-03-13 NOTE — Progress Notes (Signed)
Lab results dtd 02/04/14 from Dr. Humphrey Rolls sent to scan.

## 2014-06-17 ENCOUNTER — Other Ambulatory Visit: Payer: Self-pay | Admitting: Gynecology

## 2014-06-19 LAB — CYTOLOGY - PAP

## 2014-08-15 DIAGNOSIS — C50919 Malignant neoplasm of unspecified site of unspecified female breast: Secondary | ICD-10-CM | POA: Diagnosis not present

## 2014-08-15 DIAGNOSIS — I10 Essential (primary) hypertension: Secondary | ICD-10-CM | POA: Diagnosis not present

## 2014-08-15 DIAGNOSIS — I129 Hypertensive chronic kidney disease with stage 1 through stage 4 chronic kidney disease, or unspecified chronic kidney disease: Secondary | ICD-10-CM | POA: Diagnosis not present

## 2014-08-15 DIAGNOSIS — Z23 Encounter for immunization: Secondary | ICD-10-CM | POA: Diagnosis not present

## 2014-08-15 DIAGNOSIS — E1165 Type 2 diabetes mellitus with hyperglycemia: Secondary | ICD-10-CM | POA: Diagnosis not present

## 2014-08-15 DIAGNOSIS — Z0000 Encounter for general adult medical examination without abnormal findings: Secondary | ICD-10-CM | POA: Diagnosis not present

## 2014-09-23 ENCOUNTER — Telehealth: Payer: Self-pay | Admitting: Hematology and Oncology

## 2014-09-23 ENCOUNTER — Ambulatory Visit (HOSPITAL_BASED_OUTPATIENT_CLINIC_OR_DEPARTMENT_OTHER): Payer: Commercial Managed Care - HMO | Admitting: Hematology and Oncology

## 2014-09-23 VITALS — BP 177/79 | HR 84 | Temp 98.9°F | Resp 18 | Ht 64.0 in | Wt 171.0 lb

## 2014-09-23 DIAGNOSIS — C778 Secondary and unspecified malignant neoplasm of lymph nodes of multiple regions: Secondary | ICD-10-CM | POA: Diagnosis not present

## 2014-09-23 DIAGNOSIS — M8580 Other specified disorders of bone density and structure, unspecified site: Secondary | ICD-10-CM | POA: Diagnosis not present

## 2014-09-23 DIAGNOSIS — C50911 Malignant neoplasm of unspecified site of right female breast: Secondary | ICD-10-CM | POA: Diagnosis not present

## 2014-09-23 NOTE — Assessment & Plan Note (Signed)
Right breast invasive lobular carcinoma status post neoadjuvant hormonal therapy followed by lumpectomy and radiation therapy. Patient had extensive lymph node involvement 7/8 positive lymph nodes. At that time she Oncotype DX that revealed low risk disease and she preferred to go on observation.   Current treatment: Letrozole 2.5 mg daily  Letrozole toxicities: 1. Occasional hot flashes.  2.  Osteopenia : Takes calcium with vitamin D being monitored with bone density  12/04/2012 T score -1.5   Breast cancer surveillance: 1.  Breast exam 09/23/2014 is normal 2.  Mammogram  12/13/2013 is normal   return to clinic in 6 months for follow-up

## 2014-09-23 NOTE — Progress Notes (Signed)
Patient Care Team: [name], MD as PCP - General (Family Medicine)  DIAGNOSIS: No matching staging information was found for the patient.  SUMMARY OF ONCOLOGIC HISTORY:   Lobular carcinoma of right breast   08/03/2010 Mammogram 3.4 cm area of density involving the skin and the axillary crease on the right breast.  An ultrasound was performed showing a 2 cm mass    08/19/2010 Initial Diagnosis invasive mammary carcinoma with lobular features, ER 95%, PR 94%, Ki67 6%, HER2 negative at 0.85   08/25/2010 Breast MRI 8 x 4.1 x 4.0 cm mass in the upper outer quadrant of the right breast extending to the skin and with associated skin dimpling   08/26/2010 - 01/24/2011 Anti-estrogen oral therapy Letrozole 2.5 mg daily for neoadjuvant therapy   01/24/2011 Breast MRI complete resolution of abnormal enhancement in the right axillary tail compatible with tumor treatment response   03/26/2011 Surgery R Lumpectomy; Inv lobular cancer 4.5 cm with tumor at anterior margin, 3/4 LN positive; Reexcision no additional tumor. Oncotype 18. DId not need chemotherapy    - 07/27/2011 Radiation Therapy Radiation in [place]    CHIEF COMPLIANT:  Six-month follow-up of breast cancer  INTERVAL HISTORY: [name] is a  74 year old lady with above-mentioned history of right breast cancer treated with neoadjuvant  Antiestrogen therapy followed by surgery which still revealed multiple positive lymph nodes followed by radiation  At [place]. She is currently an oral antiestrogen therapy with letrozole and tolerating it very well without any major problems. Denies any hot flashes or muscle aches or pains.  REVIEW OF SYSTEMS:   Constitutional: Denies fevers, chills or abnormal weight loss Eyes: Denies blurriness of vision Ears, nose, mouth, throat, and face: Denies mucositis or sore throat Respiratory: Denies cough, dyspnea or wheezes Cardiovascular: Denies palpitation, chest discomfort or lower extremity swelling Gastrointestinal:   Denies nausea, heartburn or change in bowel habits Skin: Denies abnormal skin rashes Lymphatics: Denies new lymphadenopathy or easy bruising Neurological:Denies numbness, tingling or new weaknesses Behavioral/Psych: Mood is stable, no new changes  Breast:  denies any pain or lumps or nodules in either breasts All other systems were reviewed with the patient and are negative.  I have reviewed the past medical history, past surgical history, social history and family history with the patient and they are unchanged from previous note.  ALLERGIES:  has No Known Allergies.  MEDICATIONS:  Current Outpatient Prescriptions  Medication Sig Dispense Refill  . atorvastatin (LIPITOR) 40 MG tablet Take 40 mg by mouth daily.    . B Complex-C (SUPER B COMPLEX PO) Take 1,000 mg by mouth daily.     . Calcium Carbonate (CALTRATE 600 PO) Take 1,200 mg by mouth 2 (two) times daily.     . Cholecalciferol (VITAMIN D) 2000 UNITS tablet Take 4,000 Units by mouth daily.    Marland Kitchen FARXIGA 10 MG TABS tablet   6  . fluticasone (FLONASE) 50 MCG/ACT nasal spray as needed.    . furosemide (LASIX) 40 MG tablet Take 40 mg by mouth as needed.     Marland Kitchen letrozole (FEMARA) 2.5 MG tablet Take 1 tablet (2.5 mg total) by mouth daily. 90 tablet 0  . LEVEMIR FLEXTOUCH 100 UNIT/ML Pen     . metFORMIN (GLUCOPHAGE-XR) 500 MG 24 hr tablet     . metFORMIN (GLUCOPHAGE-XR) 750 MG 24 hr tablet Take 750 mg by mouth 2 (two) times daily.    . metoprolol succinate (TOPROL-XL) 50 MG 24 hr tablet     .  olmesartan (BENICAR) 40 MG tablet Take 40 mg by mouth daily.     No current facility-administered medications for this visit.    PHYSICAL EXAMINATION: ECOG PERFORMANCE STATUS: 0 - Asymptomatic  Filed Vitals:   09/23/14 1410  BP: 177/79  Pulse: 84  Temp: 98.9 F (37.2 C)  Resp: 18   Filed Weights   09/23/14 1410  Weight: 171 lb (77.565 kg)    GENERAL:alert, no distress and comfortable SKIN: skin color, texture, turgor are normal, no  rashes or significant lesions EYES: normal, Conjunctiva are pink and non-injected, sclera clear OROPHARYNX:no exudate, no erythema and lips, buccal mucosa, and tongue normal  NECK: supple, thyroid normal size, non-tender, without nodularity LYMPH:  no palpable lymphadenopathy in the cervical, axillary or inguinal LUNGS: clear to auscultation and percussion with normal breathing effort HEART: regular rate & rhythm and no murmurs and no lower extremity edema ABDOMEN:abdomen soft, non-tender and normal bowel sounds Musculoskeletal:no cyanosis of digits and no clubbing  NEURO: alert & oriented x 3 with fluent speech, no focal motor/sensory deficits BREAST: No palpable masses or nodules in either right or left breasts. No palpable axillary supraclavicular or infraclavicular adenopathy no breast tenderness or nipple discharge. (exam performed in the presence of a chaperone)  LABORATORY DATA:  I have reviewed the data as listed   Chemistry      Component Value Date/Time   NA 135 08/31/2011 0925   K 3.7 08/31/2011 0925   CL 97 08/31/2011 0925   CO2 22 08/31/2011 0925   BUN 22 08/31/2011 0925   CREATININE 1.23* 08/31/2011 0925      Component Value Date/Time   CALCIUM 9.7 08/31/2011 0925   ALKPHOS 106 08/31/2011 0925   AST 22 08/31/2011 0925   ALT 22 08/31/2011 0925   BILITOT 0.6 08/31/2011 0925       Lab Results  Component Value Date   WBC 4.8 08/31/2011   HGB 14.5 08/31/2011   HCT 41.8 08/31/2011   MCV 88.1 08/31/2011   PLT 170 08/31/2011   NEUTROABS 3.7 08/31/2011     RADIOGRAPHIC STUDIES: I have personally reviewed the radiology reports and agreed with their findings.  Mammograms done May 2015 and normal  ASSESSMENT & PLAN:  Lobular carcinoma of right breast Right breast invasive lobular carcinoma status post neoadjuvant hormonal therapy followed by lumpectomy and radiation therapy. Patient had extensive lymph node involvement 7/8 positive lymph nodes. At that time she  Oncotype DX that revealed low risk disease and she preferred to go on observation.   Current treatment: Letrozole 2.5 mg daily  Letrozole toxicities: 1. Occasional hot flashes.  2.  Osteopenia : Takes calcium with vitamin D being monitored with bone density  12/04/2012 T score -1.5   Breast cancer surveillance: 1.  Breast exam 09/23/2014 is normal 2.  Mammogram  12/13/2013 is normal   return to clinic in 6 months for follow-up   we could consider sending for breast cancer index when we get closer to your 5 to determine if she would benefit from extended adjuvant antiestrogen therapy  No orders of the defined types were placed in this encounter.   The patient has a good understanding of the overall plan. she agrees with it. She will call with any problems that may develop before her next visit here.   Rulon Eisenmenger, MD

## 2014-09-23 NOTE — Telephone Encounter (Signed)
per pof to sch pt appt-gave pt copy of sch °

## 2014-12-04 DIAGNOSIS — N3091 Cystitis, unspecified with hematuria: Secondary | ICD-10-CM | POA: Diagnosis not present

## 2014-12-04 DIAGNOSIS — R300 Dysuria: Secondary | ICD-10-CM | POA: Diagnosis not present

## 2014-12-18 DIAGNOSIS — M8580 Other specified disorders of bone density and structure, unspecified site: Secondary | ICD-10-CM | POA: Diagnosis not present

## 2014-12-18 DIAGNOSIS — Z853 Personal history of malignant neoplasm of breast: Secondary | ICD-10-CM | POA: Diagnosis not present

## 2014-12-24 ENCOUNTER — Telehealth: Payer: Self-pay

## 2014-12-24 ENCOUNTER — Other Ambulatory Visit: Payer: Self-pay | Admitting: Radiology

## 2014-12-24 DIAGNOSIS — D241 Benign neoplasm of right breast: Secondary | ICD-10-CM | POA: Diagnosis not present

## 2014-12-24 DIAGNOSIS — N6031 Fibrosclerosis of right breast: Secondary | ICD-10-CM | POA: Diagnosis not present

## 2014-12-24 DIAGNOSIS — Z853 Personal history of malignant neoplasm of breast: Secondary | ICD-10-CM | POA: Diagnosis not present

## 2014-12-24 NOTE — Telephone Encounter (Signed)
Bone density order faxed to [facility].  Sent to scan

## 2014-12-24 NOTE — Telephone Encounter (Signed)
Order sent to [facility] for mammogram.  Sent to scan.

## 2014-12-26 ENCOUNTER — Encounter: Payer: Self-pay | Admitting: *Deleted

## 2014-12-26 NOTE — Progress Notes (Signed)
Received mammo, u/s and bone density reports from [place], sent to scan.

## 2015-01-20 ENCOUNTER — Telehealth: Payer: Self-pay

## 2015-01-20 NOTE — Telephone Encounter (Signed)
Ultrasound and mammogram results rcvd from [facility] dtd 12/24/14.  Reviewed by Dr. Lindi Adie, Sent to scan.

## 2015-02-07 ENCOUNTER — Telehealth: Payer: Self-pay

## 2015-02-07 NOTE — Telephone Encounter (Signed)
Ultrasound results dtd 12/18/14 rcvd from [facility].  Reviewed by Dr. Lavonda Jumbo to scan.

## 2015-04-01 ENCOUNTER — Ambulatory Visit: Payer: Commercial Managed Care - HMO | Admitting: Hematology and Oncology

## 2015-04-01 ENCOUNTER — Other Ambulatory Visit: Payer: Commercial Managed Care - HMO

## 2015-04-01 NOTE — Assessment & Plan Note (Addendum)
Lobular carcinoma of right breast Right breast invasive lobular carcinoma status post neoadjuvant hormonal therapy followed by lumpectomy 03/26/2011 and radiation therapy completed 07/27/2011. Patient had extensive lymph node involvement 7/8 positive lymph nodes. At that time she Oncotype DX that revealed low risk disease and she preferred to go on observation.   Current treatment: Letrozole 2.5 mg daily started 08/26/2010 (started Neoadjuvantly and continued adjuvantly)  Letrozole toxicities: 1. Occasional hot flashes.  2. Osteopenia : Takes calcium with vitamin D being monitored with bone density 12/17/2013 T score -1.2  Breast cancer surveillance: 1. Breast exam 04/01/2015 is normal 2. Mammogram 12/17/2013 is normal, breast density category C  return to clinic in 1 year for follow-up

## 2015-04-06 ENCOUNTER — Other Ambulatory Visit: Payer: Self-pay

## 2015-04-07 ENCOUNTER — Telehealth: Payer: Self-pay | Admitting: Hematology and Oncology

## 2015-04-07 NOTE — Telephone Encounter (Signed)
Called patient and we have rescheduled her missed appointment

## 2015-04-14 ENCOUNTER — Other Ambulatory Visit (HOSPITAL_BASED_OUTPATIENT_CLINIC_OR_DEPARTMENT_OTHER): Payer: Commercial Managed Care - HMO

## 2015-04-14 ENCOUNTER — Ambulatory Visit (HOSPITAL_BASED_OUTPATIENT_CLINIC_OR_DEPARTMENT_OTHER): Payer: Commercial Managed Care - HMO | Admitting: Hematology and Oncology

## 2015-04-14 ENCOUNTER — Telehealth: Payer: Self-pay | Admitting: Hematology and Oncology

## 2015-04-14 VITALS — BP 178/95 | HR 86 | Temp 98.4°F | Resp 18 | Ht 64.0 in | Wt 165.2 lb

## 2015-04-14 DIAGNOSIS — C50911 Malignant neoplasm of unspecified site of right female breast: Secondary | ICD-10-CM

## 2015-04-14 LAB — CBC WITH DIFFERENTIAL/PLATELET
BASO%: 0.6 % (ref 0.0–2.0)
Basophils Absolute: 0 10*3/uL (ref 0.0–0.1)
EOS%: 1.6 % (ref 0.0–7.0)
Eosinophils Absolute: 0.1 10*3/uL (ref 0.0–0.5)
HEMATOCRIT: 42.4 % (ref 34.8–46.6)
HGB: 14.5 g/dL (ref 11.6–15.9)
LYMPH#: 1.2 10*3/uL (ref 0.9–3.3)
LYMPH%: 19.8 % (ref 14.0–49.7)
MCH: 30.4 pg (ref 25.1–34.0)
MCHC: 34.3 g/dL (ref 31.5–36.0)
MCV: 88.7 fL (ref 79.5–101.0)
MONO#: 0.6 10*3/uL (ref 0.1–0.9)
MONO%: 9.8 % (ref 0.0–14.0)
NEUT#: 4 10*3/uL (ref 1.5–6.5)
NEUT%: 68.2 % (ref 38.4–76.8)
PLATELETS: 197 10*3/uL (ref 145–400)
RBC: 4.77 10*6/uL (ref 3.70–5.45)
RDW: 13.3 % (ref 11.2–14.5)
WBC: 5.9 10*3/uL (ref 3.9–10.3)

## 2015-04-14 LAB — COMPREHENSIVE METABOLIC PANEL (CC13)
ALBUMIN: 3.4 g/dL — AB (ref 3.5–5.0)
ALT: 17 U/L (ref 0–55)
ANION GAP: 12 meq/L — AB (ref 3–11)
AST: 13 U/L (ref 5–34)
Alkaline Phosphatase: 139 U/L (ref 40–150)
BILIRUBIN TOTAL: 0.79 mg/dL (ref 0.20–1.20)
BUN: 13 mg/dL (ref 7.0–26.0)
CO2: 27 mEq/L (ref 22–29)
CREATININE: 1.3 mg/dL — AB (ref 0.6–1.1)
Calcium: 10.3 mg/dL (ref 8.4–10.4)
Chloride: 99 mEq/L (ref 98–109)
EGFR: 41 mL/min/{1.73_m2} — ABNORMAL LOW (ref >90)
Glucose: 499 mg/dl — ABNORMAL HIGH (ref 70–140)
Potassium: 4 mEq/L (ref 3.5–5.1)
Sodium: 138 mEq/L (ref 136–145)
TOTAL PROTEIN: 7.1 g/dL (ref 6.4–8.3)

## 2015-04-14 MED ORDER — LETROZOLE 2.5 MG PO TABS: 2.5000 mg | ORAL_TABLET | Freq: Every day | ORAL | Status: DC

## 2015-04-14 NOTE — Progress Notes (Signed)
Patient Care Team: [name], MD as PCP - General (Family Medicine)  DIAGNOSIS: No matching staging information was found for the patient.  SUMMARY OF ONCOLOGIC HISTORY:   Lobular carcinoma of right breast   08/03/2010 Mammogram 3.4 cm area of density involving the skin and the axillary crease on the right breast.  An ultrasound was performed showing a 2 cm mass    08/19/2010 Initial Diagnosis invasive mammary carcinoma with lobular features, ER 95%, PR 94%, Ki67 6%, HER2 negative at 0.85   08/25/2010 Breast MRI 8 x 4.1 x 4.0 cm mass in the upper outer quadrant of the right breast extending to the skin and with associated skin dimpling   08/26/2010 - 01/24/2011 Anti-estrogen oral therapy Letrozole 2.5 mg daily for neoadjuvant therapy   01/24/2011 Breast MRI complete resolution of abnormal enhancement in the right axillary tail compatible with tumor treatment response   03/26/2011 Surgery R Lumpectomy; Inv lobular cancer 4.5 cm with tumor at anterior margin, 3/4 LN positive; Reexcision no additional tumor. Oncotype 18. DId not need chemotherapy    - 07/27/2011 Radiation Therapy Radiation in [place]   09/02/2011 -  Anti-estrogen oral therapy Letrozole 2.5 mg daily    CHIEF COMPLIANT: breast cancer surveillance  INTERVAL HISTORY: [name] is a 74 year old with above-mentioned history of lobular breast cancer treated with neo-adjuvant antiestrogen therapy followed by lumpectomy and radiation and is currently on letrozole adjuvant therapy. She is tolerating it fairly well. She denies myalgias or hot flashes. In May she had mammograms ultrasound. She palpated abnormality under the breast. She underwent a biopsy which came back benign scar tissue.  REVIEW OF SYSTEMS:   Constitutional: Denies fevers, chills or abnormal weight loss Eyes: Denies blurriness of vision Ears, nose, mouth, throat, and face: Denies mucositis or sore throat Respiratory: Denies cough, dyspnea or wheezes Cardiovascular: Denies  palpitation, chest discomfort or lower extremity swelling Gastrointestinal:  Denies nausea, heartburn or change in bowel habits Skin: Denies abnormal skin rashes Lymphatics: Denies new lymphadenopathy or easy bruising Neurological:Denies numbness, tingling or new weaknesses Behavioral/Psych: Mood is stable, no new changes  Breast: biopsy May 2016 benign breast tissue scar tissue All other systems were reviewed with the patient and are negative.  I have reviewed the past medical history, past surgical history, social history and family history with the patient and they are unchanged from previous note.  ALLERGIES:  has No Known Allergies.  MEDICATIONS:  Current Outpatient Prescriptions  Medication Sig Dispense Refill  . atorvastatin (LIPITOR) 40 MG tablet Take 40 mg by mouth daily. Take 1/2 pill daily    . B Complex-C (SUPER B COMPLEX PO) Take 1,000 mg by mouth daily.     . Calcium Carbonate (CALTRATE 600 PO) Take 1,200 mg by mouth 2 (two) times daily.     . Cholecalciferol (VITAMIN D) 2000 UNITS tablet Take 4,000 Units by mouth daily.    Marland Kitchen letrozole (FEMARA) 2.5 MG tablet Take 1 tablet (2.5 mg total) by mouth daily. 90 tablet 0  . LEVEMIR FLEXTOUCH 100 UNIT/ML Pen 75/25    . metFORMIN (GLUCOPHAGE-XR) 500 MG 24 hr tablet 500 mg daily with breakfast.     . metoprolol succinate (TOPROL-XL) 50 MG 24 hr tablet 50 mg daily.     Marland Kitchen olmesartan (BENICAR) 40 MG tablet Take 40 mg by mouth daily.    . fluticasone (FLONASE) 50 MCG/ACT nasal spray as needed.    . furosemide (LASIX) 40 MG tablet Take 40 mg by mouth as needed.  No current facility-administered medications for this visit.    PHYSICAL EXAMINATION: ECOG PERFORMANCE STATUS: 1 - Symptomatic but completely ambulatory  Filed Vitals:   04/14/15 0926  BP: 178/95  Pulse: 86  Temp: 98.4 F (36.9 C)  Resp: 18   Filed Weights   04/14/15 0926  Weight: 165 lb 3.2 oz (74.934 kg)    GENERAL:alert, no distress and comfortable SKIN:  skin color, texture, turgor are normal, no rashes or significant lesions EYES: normal, Conjunctiva are pink and non-injected, sclera clear OROPHARYNX:no exudate, no erythema and lips, buccal mucosa, and tongue normal  NECK: supple, thyroid normal size, non-tender, without nodularity LYMPH:  no palpable lymphadenopathy in the cervical, axillary or inguinal LUNGS: clear to auscultation and percussion with normal breathing effort HEART: regular rate & rhythm and no murmurs and no lower extremity edema ABDOMEN:abdomen soft, non-tender and normal bowel sounds Musculoskeletal:no cyanosis of digits and no clubbing  NEURO: alert & oriented x 3 with fluent speech, no focal motor/sensory deficits BREAST: No palpable masses or nodules in either right or left breasts. No palpable axillary supraclavicular or infraclavicular adenopathy no breast tenderness or nipple discharge. (exam performed in the presence of a chaperone)  LABORATORY DATA:  I have reviewed the data as listed   Chemistry      Component Value Date/Time   NA 135 08/31/2011 0925   K 3.7 08/31/2011 0925   CL 97 08/31/2011 0925   CO2 22 08/31/2011 0925   BUN 22 08/31/2011 0925   CREATININE 1.23* 08/31/2011 0925      Component Value Date/Time   CALCIUM 9.7 08/31/2011 0925   ALKPHOS 106 08/31/2011 0925   AST 22 08/31/2011 0925   ALT 22 08/31/2011 0925   BILITOT 0.6 08/31/2011 0925       Lab Results  Component Value Date   WBC 5.9 04/14/2015   HGB 14.5 04/14/2015   HCT 42.4 04/14/2015   MCV 88.7 04/14/2015   PLT 197 04/14/2015   NEUTROABS 4.0 04/14/2015   ASSESSMENT & PLAN:  Lobular carcinoma of right breast Right breast invasive lobular carcinoma status post neoadjuvant hormonal therapy followed by lumpectomy and radiation therapy. Patient had extensive lymph node involvement 7/8 positive lymph nodes. At that time she Oncotype DX that revealed low risk of recurrence and she did not get chemotherapy.  Current treatment:  Letrozole 2.5 mg daily since 2012 Because of her lobular breast cancer and because she had 7 positive lymph nodes and recommended extended adjuvant therapy beyond 5 years.  Letrozole toxicities: 1. Occasional hot flashes.  2. Osteopenia : Takes calcium with vitamin D being monitored with bone density  Breast cancer surveillance: 1. Breast exam 04/14/2015 is normal 2. Mammogramand ultrasound 12/14/2014 palpable abnormality in the right axilla ultrasound biopsy was performed which showed dense stromal fibrosis consistent with scar no evidence of malignancy. 3. Bone density May 2016: T score -1.2 return to clinic in One year for follow-up, patient will not need blood work for surveillance.  No orders of the defined types were placed in this encounter.   The patient has a good understanding of the overall plan. she agrees with it. she will call with any problems that may develop before the next visit here.   Rulon Eisenmenger, MD

## 2015-04-14 NOTE — Telephone Encounter (Signed)
Appointments made and avs printed °

## 2015-04-14 NOTE — Assessment & Plan Note (Signed)
Right breast invasive lobular carcinoma status post neoadjuvant hormonal therapy followed by lumpectomy and radiation therapy. Patient had extensive lymph node involvement 7/8 positive lymph nodes. At that time she Oncotype DX that revealed low risk of recurrence.  Current treatment: Letrozole 2.5 mg daily  Letrozole toxicities: 1. Occasional hot flashes.  2. Osteopenia : Takes calcium with vitamin D being monitored with bone density  Breast cancer surveillance: 1. Breast exam 04/14/2015 is normal 2. Mammogramand ultrasound 12/14/2014 palpable abnormality in the right axilla ultrasound biopsy was performed which showed dense stromal fibrosis consistent with scar no evidence of malignancy. 3. Bone density May 2016: T score -1.2 return to clinic in 6 months for follow-up

## 2015-04-23 DIAGNOSIS — S0990XA Unspecified injury of head, initial encounter: Secondary | ICD-10-CM | POA: Diagnosis not present

## 2015-04-23 DIAGNOSIS — F99 Mental disorder, not otherwise specified: Secondary | ICD-10-CM | POA: Diagnosis not present

## 2015-04-23 DIAGNOSIS — G459 Transient cerebral ischemic attack, unspecified: Secondary | ICD-10-CM | POA: Diagnosis not present

## 2015-04-23 DIAGNOSIS — E780 Pure hypercholesterolemia: Secondary | ICD-10-CM | POA: Diagnosis not present

## 2015-04-23 DIAGNOSIS — I10 Essential (primary) hypertension: Secondary | ICD-10-CM | POA: Diagnosis not present

## 2015-04-23 DIAGNOSIS — E1165 Type 2 diabetes mellitus with hyperglycemia: Secondary | ICD-10-CM | POA: Diagnosis not present

## 2015-04-23 DIAGNOSIS — Z79899 Other long term (current) drug therapy: Secondary | ICD-10-CM | POA: Diagnosis not present

## 2015-04-23 DIAGNOSIS — Z2821 Immunization not carried out because of patient refusal: Secondary | ICD-10-CM | POA: Diagnosis not present

## 2015-04-23 DIAGNOSIS — R4182 Altered mental status, unspecified: Secondary | ICD-10-CM | POA: Diagnosis not present

## 2015-06-30 DIAGNOSIS — Z853 Personal history of malignant neoplasm of breast: Secondary | ICD-10-CM | POA: Diagnosis not present

## 2015-10-02 DIAGNOSIS — Z79899 Other long term (current) drug therapy: Secondary | ICD-10-CM | POA: Diagnosis not present

## 2015-10-02 DIAGNOSIS — I10 Essential (primary) hypertension: Secondary | ICD-10-CM | POA: Diagnosis not present

## 2015-10-02 DIAGNOSIS — Z0000 Encounter for general adult medical examination without abnormal findings: Secondary | ICD-10-CM | POA: Diagnosis not present

## 2015-10-02 DIAGNOSIS — E1165 Type 2 diabetes mellitus with hyperglycemia: Secondary | ICD-10-CM | POA: Diagnosis not present

## 2015-10-02 DIAGNOSIS — E559 Vitamin D deficiency, unspecified: Secondary | ICD-10-CM | POA: Diagnosis not present

## 2015-10-02 DIAGNOSIS — E782 Mixed hyperlipidemia: Secondary | ICD-10-CM | POA: Diagnosis not present

## 2015-10-02 DIAGNOSIS — I129 Hypertensive chronic kidney disease with stage 1 through stage 4 chronic kidney disease, or unspecified chronic kidney disease: Secondary | ICD-10-CM | POA: Diagnosis not present

## 2015-12-01 DIAGNOSIS — H2513 Age-related nuclear cataract, bilateral: Secondary | ICD-10-CM | POA: Diagnosis not present

## 2015-12-01 DIAGNOSIS — E113311 Type 2 diabetes mellitus with moderate nonproliferative diabetic retinopathy with macular edema, right eye: Secondary | ICD-10-CM | POA: Diagnosis not present

## 2015-12-25 DIAGNOSIS — Z853 Personal history of malignant neoplasm of breast: Secondary | ICD-10-CM | POA: Diagnosis not present

## 2016-01-20 DIAGNOSIS — H2513 Age-related nuclear cataract, bilateral: Secondary | ICD-10-CM | POA: Diagnosis not present

## 2016-01-20 DIAGNOSIS — H02839 Dermatochalasis of unspecified eye, unspecified eyelid: Secondary | ICD-10-CM | POA: Diagnosis not present

## 2016-01-20 DIAGNOSIS — H18411 Arcus senilis, right eye: Secondary | ICD-10-CM | POA: Diagnosis not present

## 2016-01-20 DIAGNOSIS — E11319 Type 2 diabetes mellitus with unspecified diabetic retinopathy without macular edema: Secondary | ICD-10-CM | POA: Diagnosis not present

## 2016-01-20 DIAGNOSIS — H35371 Puckering of macula, right eye: Secondary | ICD-10-CM | POA: Diagnosis not present

## 2016-01-20 DIAGNOSIS — H18412 Arcus senilis, left eye: Secondary | ICD-10-CM | POA: Diagnosis not present

## 2016-01-20 DIAGNOSIS — H2511 Age-related nuclear cataract, right eye: Secondary | ICD-10-CM | POA: Diagnosis not present

## 2016-03-10 DIAGNOSIS — H43813 Vitreous degeneration, bilateral: Secondary | ICD-10-CM | POA: Diagnosis not present

## 2016-03-10 DIAGNOSIS — E113393 Type 2 diabetes mellitus with moderate nonproliferative diabetic retinopathy without macular edema, bilateral: Secondary | ICD-10-CM | POA: Diagnosis not present

## 2016-03-10 DIAGNOSIS — H3582 Retinal ischemia: Secondary | ICD-10-CM | POA: Diagnosis not present

## 2016-03-10 DIAGNOSIS — H35372 Puckering of macula, left eye: Secondary | ICD-10-CM | POA: Diagnosis not present

## 2016-04-05 DIAGNOSIS — H2511 Age-related nuclear cataract, right eye: Secondary | ICD-10-CM | POA: Diagnosis not present

## 2016-04-05 DIAGNOSIS — H2513 Age-related nuclear cataract, bilateral: Secondary | ICD-10-CM | POA: Diagnosis not present

## 2016-04-06 DIAGNOSIS — H2512 Age-related nuclear cataract, left eye: Secondary | ICD-10-CM | POA: Diagnosis not present

## 2016-04-13 ENCOUNTER — Ambulatory Visit (HOSPITAL_BASED_OUTPATIENT_CLINIC_OR_DEPARTMENT_OTHER): Payer: Commercial Managed Care - HMO | Admitting: Hematology and Oncology

## 2016-04-13 ENCOUNTER — Encounter: Payer: Self-pay | Admitting: Hematology and Oncology

## 2016-04-13 DIAGNOSIS — Z79811 Long term (current) use of aromatase inhibitors: Secondary | ICD-10-CM | POA: Diagnosis not present

## 2016-04-13 DIAGNOSIS — C773 Secondary and unspecified malignant neoplasm of axilla and upper limb lymph nodes: Secondary | ICD-10-CM | POA: Diagnosis not present

## 2016-04-13 DIAGNOSIS — C50911 Malignant neoplasm of unspecified site of right female breast: Secondary | ICD-10-CM | POA: Diagnosis not present

## 2016-04-13 DIAGNOSIS — M8580 Other specified disorders of bone density and structure, unspecified site: Secondary | ICD-10-CM

## 2016-04-13 NOTE — Progress Notes (Signed)
Patient Care Team: [name], MD as PCP - General (Family Medicine)  SUMMARY OF ONCOLOGIC HISTORY:   Lobular carcinoma of right breast ([place])   08/03/2010 Mammogram    3.4 cm area of density involving the skin and the axillary crease on the right breast.  An ultrasound was performed showing a 2 cm mass       08/19/2010 Initial Diagnosis    invasive mammary carcinoma with lobular features, ER 95%, PR 94%, Ki67 6%, HER2 negative at 0.85      08/25/2010 Breast MRI    8 x 4.1 x 4.0 cm mass in the upper outer quadrant of the right breast extending to the skin and with associated skin dimpling      08/26/2010 - 01/24/2011 Anti-estrogen oral therapy    Letrozole 2.5 mg daily for neoadjuvant therapy      01/24/2011 Breast MRI    complete resolution of abnormal enhancement in the right axillary tail compatible with tumor treatment response      03/26/2011 Surgery    R Lumpectomy; Inv lobular cancer 4.5 cm with tumor at anterior margin, 3/4 LN positive; Reexcision no additional tumor. Oncotype 18. DId not need chemotherapy       - 07/27/2011 Radiation Therapy    Radiation in       09/02/2011 -  Anti-estrogen oral therapy    Letrozole 2.5 mg daily       CHIEF COMPLIANT: Follow-up on letrozole therapy  INTERVAL HISTORY: [name] is a 86 year with above-mentioned history of right breast cancer currently on adjuvant letrozole therapy. She is tolerating it very well. She does not have any hot flashes or myalgias. Energy levels are somewhat low. Denies any lumps or nodules in the breast. She is concerned that the right breast is larger than the left as well as discomfort underneath the right axilla. Mammograms done in May 2017 1 normal.  REVIEW OF SYSTEMS:   Constitutional: Denies fevers, chills or abnormal weight loss Eyes: Denies blurriness of vision Ears, nose, mouth, throat, and face: Denies mucositis or sore throat Respiratory: Denies cough, dyspnea or  wheezes Cardiovascular: Denies palpitation, chest discomfort Gastrointestinal:  Denies nausea, heartburn or change in bowel habits Skin: Denies abnormal skin rashes Lymphatics: Denies new lymphadenopathy or easy bruising Neurological:Denies numbness, tingling or new weaknesses Behavioral/Psych: Mood is stable, no new changes  Extremities: No lower extremity edema Breast: Right breast is larger than the left and tenderness in the right axilla All other systems were reviewed with the patient and are negative.  I have reviewed the past medical history, past surgical history, social history and family history with the patient and they are unchanged from previous note.  ALLERGIES:  has No Known Allergies.  MEDICATIONS:  Current Outpatient Prescriptions  Medication Sig Dispense Refill  . atorvastatin (LIPITOR) 40 MG tablet Take 40 mg by mouth daily. Take 1/2 pill daily    . B Complex-C (SUPER B COMPLEX PO) Take 1,000 mg by mouth daily.     . Calcium Carbonate (CALTRATE 600 PO) Take 1,200 mg by mouth 2 (two) times daily.     . Cholecalciferol (VITAMIN D) 2000 UNITS tablet Take 4,000 Units by mouth daily.    . fluticasone (FLONASE) 50 MCG/ACT nasal spray as needed.    . furosemide (LASIX) 40 MG tablet Take 40 mg by mouth as needed.     Marland Kitchen letrozole (FEMARA) 2.5 MG tablet Take 1 tablet (2.5 mg total) by mouth daily. 90 tablet 0  .  LEVEMIR FLEXTOUCH 100 UNIT/ML Pen 75/25    . metFORMIN (GLUCOPHAGE-XR) 500 MG 24 hr tablet 500 mg daily with breakfast.     . metoprolol succinate (TOPROL-XL) 50 MG 24 hr tablet 50 mg daily.     Marland Kitchen olmesartan (BENICAR) 40 MG tablet Take 40 mg by mouth daily.     No current facility-administered medications for this visit.     PHYSICAL EXAMINATION: ECOG PERFORMANCE STATUS: 1 - Symptomatic but completely ambulatory  Vitals:   04/13/16 1036 04/13/16 1039  BP: (!) 143/113 140/90  Pulse: 77   Resp: 19   Temp: 98.5 F (36.9 C)    Filed Weights   04/13/16 1036   Weight: 166 lb 8 oz (75.5 kg)    GENERAL:alert, no distress and comfortable SKIN: skin color, texture, turgor are normal, no rashes or significant lesions EYES: normal, Conjunctiva are pink and non-injected, sclera clear OROPHARYNX:no exudate, no erythema and lips, buccal mucosa, and tongue normal  NECK: supple, thyroid normal size, non-tender, without nodularity LYMPH:  no palpable lymphadenopathy in the cervical, axillary or inguinal LUNGS: clear to auscultation and percussion with normal breathing effort HEART: regular rate & rhythm and no murmurs and no lower extremity edema ABDOMEN:abdomen soft, non-tender and normal bowel sounds MUSCULOSKELETAL:no cyanosis of digits and no clubbing  NEURO: alert & oriented x 3 with fluent speech, no focal motor/sensory deficits EXTREMITIES: No lower extremity edema BREAST: No palpable masses or nodules in either right or left breasts. No palpable axillary supraclavicular or infraclavicular adenopathy no breast tenderness or nipple discharge. (exam performed in the presence of a chaperone)  LABORATORY DATA:  I have reviewed the data as listed   Chemistry      Component Value Date/Time   NA 138 04/14/2015 0912   K 4.0 04/14/2015 0912   CL 97 08/31/2011 0925   CO2 27 04/14/2015 0912   BUN 13.0 04/14/2015 0912   CREATININE 1.3 (H) 04/14/2015 0912      Component Value Date/Time   CALCIUM 10.3 04/14/2015 0912   ALKPHOS 139 04/14/2015 0912   AST 13 04/14/2015 0912   ALT 17 04/14/2015 0912   BILITOT 0.79 04/14/2015 0912       Lab Results  Component Value Date   WBC 5.9 04/14/2015   HGB 14.5 04/14/2015   HCT 42.4 04/14/2015   MCV 88.7 04/14/2015   PLT 197 04/14/2015   NEUTROABS 4.0 04/14/2015     ASSESSMENT & PLAN:  Lobular carcinoma of right breast Right breast invasive lobular carcinoma status post neoadjuvant hormonal therapy followed by lumpectomy and radiation therapy. Patient had extensive lymph node involvement 7/8 positive  lymph nodes. At that time she Oncotype DX that revealed low risk of recurrence and she did not get chemotherapy.  Current treatment: Letrozole 2.5 mg daily since 2012 Because of her lobular breast cancer and because she had 7 positive lymph nodes and recommended extended adjuvant therapy beyond 5 years.  Letrozole toxicities: 1. Occasional hot flashes.  2. Osteopenia : Takes calcium with vitamin D being monitored with bone density  Breast cancer surveillance: 1. Breast exam 04/13/2016 : No palpable lumps or nodules. Specifically changed he noticed of the cross the most is a pleasant Caucasian 2. Mammogramand ultrasound 12/24/2015 palpable abnormality in the right axilla ultrasound biopsy was performed which showed dense stromal fibrosis consistent with scar no evidence of malignancy. 3. Bone density May 2016: T score -1.2 return to clinic in One year for follow-up, patient will not need blood work for surveillance.  No orders of the defined types were placed in this encounter.  The patient has a good understanding of the overall plan. she agrees with it. she will call with any problems that may develop before the next visit here.   Rulon Eisenmenger, MD 04/13/16

## 2016-04-13 NOTE — Assessment & Plan Note (Signed)
Right breast invasive lobular carcinoma status post neoadjuvant hormonal therapy followed by lumpectomy and radiation therapy. Patient had extensive lymph node involvement 7/8 positive lymph nodes. At that time she Oncotype DX that revealed low risk of recurrence and she did not get chemotherapy.  Current treatment: Letrozole 2.5 mg daily since 2012 Because of her lobular breast cancer and because she had 7 positive lymph nodes and recommended extended adjuvant therapy beyond 5 years.  Letrozole toxicities: 1. Occasional hot flashes.  2. Osteopenia : Takes calcium with vitamin D being monitored with bone density  Breast cancer surveillance: 1. Breast exam 04/14/2075 is normal 2. Mammogramand ultrasound 12/24/2015 palpable abnormality in the right axilla ultrasound biopsy was performed which showed dense stromal fibrosis consistent with scar no evidence of malignancy. 3. Bone density May 2016: T score -1.2 return to clinic in One year for follow-up, patient will not need blood work for surveillance.

## 2016-04-19 DIAGNOSIS — H2513 Age-related nuclear cataract, bilateral: Secondary | ICD-10-CM | POA: Diagnosis not present

## 2016-04-19 DIAGNOSIS — H2512 Age-related nuclear cataract, left eye: Secondary | ICD-10-CM | POA: Diagnosis not present

## 2016-05-19 DIAGNOSIS — E113391 Type 2 diabetes mellitus with moderate nonproliferative diabetic retinopathy without macular edema, right eye: Secondary | ICD-10-CM | POA: Diagnosis not present

## 2016-05-19 DIAGNOSIS — H35371 Puckering of macula, right eye: Secondary | ICD-10-CM | POA: Diagnosis not present

## 2016-05-19 DIAGNOSIS — H59032 Cystoid macular edema following cataract surgery, left eye: Secondary | ICD-10-CM | POA: Diagnosis not present

## 2016-05-19 DIAGNOSIS — H43813 Vitreous degeneration, bilateral: Secondary | ICD-10-CM | POA: Diagnosis not present

## 2016-05-27 DIAGNOSIS — H35371 Puckering of macula, right eye: Secondary | ICD-10-CM | POA: Diagnosis not present

## 2016-05-27 DIAGNOSIS — H35351 Cystoid macular degeneration, right eye: Secondary | ICD-10-CM | POA: Diagnosis not present

## 2016-05-27 DIAGNOSIS — E113511 Type 2 diabetes mellitus with proliferative diabetic retinopathy with macular edema, right eye: Secondary | ICD-10-CM | POA: Diagnosis not present

## 2016-06-04 DIAGNOSIS — H35341 Macular cyst, hole, or pseudohole, right eye: Secondary | ICD-10-CM | POA: Diagnosis not present

## 2016-06-04 DIAGNOSIS — H3581 Retinal edema: Secondary | ICD-10-CM | POA: Diagnosis not present

## 2016-06-04 DIAGNOSIS — E113312 Type 2 diabetes mellitus with moderate nonproliferative diabetic retinopathy with macular edema, left eye: Secondary | ICD-10-CM | POA: Diagnosis not present

## 2016-06-11 DIAGNOSIS — E782 Mixed hyperlipidemia: Secondary | ICD-10-CM | POA: Diagnosis not present

## 2016-06-11 DIAGNOSIS — I10 Essential (primary) hypertension: Secondary | ICD-10-CM | POA: Diagnosis not present

## 2016-06-11 DIAGNOSIS — Z79899 Other long term (current) drug therapy: Secondary | ICD-10-CM | POA: Diagnosis not present

## 2016-06-11 DIAGNOSIS — I129 Hypertensive chronic kidney disease with stage 1 through stage 4 chronic kidney disease, or unspecified chronic kidney disease: Secondary | ICD-10-CM | POA: Diagnosis not present

## 2016-06-11 DIAGNOSIS — E1165 Type 2 diabetes mellitus with hyperglycemia: Secondary | ICD-10-CM | POA: Diagnosis not present

## 2016-06-11 DIAGNOSIS — Z1389 Encounter for screening for other disorder: Secondary | ICD-10-CM | POA: Diagnosis not present

## 2016-06-11 DIAGNOSIS — H6123 Impacted cerumen, bilateral: Secondary | ICD-10-CM | POA: Diagnosis not present

## 2016-06-11 DIAGNOSIS — Z23 Encounter for immunization: Secondary | ICD-10-CM | POA: Diagnosis not present

## 2016-06-25 DIAGNOSIS — E113391 Type 2 diabetes mellitus with moderate nonproliferative diabetic retinopathy without macular edema, right eye: Secondary | ICD-10-CM | POA: Diagnosis not present

## 2016-06-25 DIAGNOSIS — H35371 Puckering of macula, right eye: Secondary | ICD-10-CM | POA: Diagnosis not present

## 2016-06-25 DIAGNOSIS — H35351 Cystoid macular degeneration, right eye: Secondary | ICD-10-CM | POA: Diagnosis not present

## 2016-06-25 DIAGNOSIS — E113312 Type 2 diabetes mellitus with moderate nonproliferative diabetic retinopathy with macular edema, left eye: Secondary | ICD-10-CM | POA: Diagnosis not present

## 2016-07-02 DIAGNOSIS — C50919 Malignant neoplasm of unspecified site of unspecified female breast: Secondary | ICD-10-CM | POA: Diagnosis not present

## 2016-07-30 DIAGNOSIS — E113312 Type 2 diabetes mellitus with moderate nonproliferative diabetic retinopathy with macular edema, left eye: Secondary | ICD-10-CM | POA: Diagnosis not present

## 2016-07-30 DIAGNOSIS — H35351 Cystoid macular degeneration, right eye: Secondary | ICD-10-CM | POA: Diagnosis not present

## 2016-07-30 DIAGNOSIS — H35371 Puckering of macula, right eye: Secondary | ICD-10-CM | POA: Diagnosis not present

## 2016-07-30 DIAGNOSIS — E113391 Type 2 diabetes mellitus with moderate nonproliferative diabetic retinopathy without macular edema, right eye: Secondary | ICD-10-CM | POA: Diagnosis not present

## 2016-08-16 DIAGNOSIS — R4182 Altered mental status, unspecified: Secondary | ICD-10-CM | POA: Diagnosis not present

## 2016-08-16 DIAGNOSIS — R4781 Slurred speech: Secondary | ICD-10-CM | POA: Diagnosis not present

## 2016-08-16 DIAGNOSIS — R079 Chest pain, unspecified: Secondary | ICD-10-CM | POA: Diagnosis not present

## 2016-08-16 DIAGNOSIS — I10 Essential (primary) hypertension: Secondary | ICD-10-CM | POA: Diagnosis not present

## 2016-08-16 DIAGNOSIS — I129 Hypertensive chronic kidney disease with stage 1 through stage 4 chronic kidney disease, or unspecified chronic kidney disease: Secondary | ICD-10-CM | POA: Diagnosis not present

## 2016-08-16 DIAGNOSIS — J029 Acute pharyngitis, unspecified: Secondary | ICD-10-CM | POA: Diagnosis not present

## 2016-08-16 DIAGNOSIS — R4789 Other speech disturbances: Secondary | ICD-10-CM | POA: Diagnosis not present

## 2016-08-16 DIAGNOSIS — R05 Cough: Secondary | ICD-10-CM | POA: Diagnosis not present

## 2016-08-16 DIAGNOSIS — G459 Transient cerebral ischemic attack, unspecified: Secondary | ICD-10-CM | POA: Diagnosis not present

## 2016-08-16 DIAGNOSIS — E785 Hyperlipidemia, unspecified: Secondary | ICD-10-CM | POA: Diagnosis not present

## 2016-08-16 DIAGNOSIS — R29818 Other symptoms and signs involving the nervous system: Secondary | ICD-10-CM | POA: Diagnosis not present

## 2016-08-16 DIAGNOSIS — F8089 Other developmental disorders of speech and language: Secondary | ICD-10-CM | POA: Diagnosis not present

## 2016-08-16 DIAGNOSIS — E860 Dehydration: Secondary | ICD-10-CM | POA: Diagnosis not present

## 2016-08-16 DIAGNOSIS — E1165 Type 2 diabetes mellitus with hyperglycemia: Secondary | ICD-10-CM | POA: Diagnosis not present

## 2016-08-16 DIAGNOSIS — G9340 Encephalopathy, unspecified: Secondary | ICD-10-CM | POA: Diagnosis not present

## 2016-08-16 DIAGNOSIS — E1122 Type 2 diabetes mellitus with diabetic chronic kidney disease: Secondary | ICD-10-CM | POA: Diagnosis not present

## 2016-08-16 DIAGNOSIS — N183 Chronic kidney disease, stage 3 (moderate): Secondary | ICD-10-CM | POA: Diagnosis not present

## 2016-08-16 DIAGNOSIS — E0829 Diabetes mellitus due to underlying condition with other diabetic kidney complication: Secondary | ICD-10-CM | POA: Diagnosis not present

## 2016-08-16 DIAGNOSIS — N390 Urinary tract infection, site not specified: Secondary | ICD-10-CM | POA: Diagnosis not present

## 2016-08-17 DIAGNOSIS — E785 Hyperlipidemia, unspecified: Secondary | ICD-10-CM | POA: Diagnosis not present

## 2016-08-17 DIAGNOSIS — F8089 Other developmental disorders of speech and language: Secondary | ICD-10-CM | POA: Diagnosis not present

## 2016-08-17 DIAGNOSIS — I10 Essential (primary) hypertension: Secondary | ICD-10-CM | POA: Diagnosis not present

## 2016-08-17 DIAGNOSIS — G459 Transient cerebral ischemic attack, unspecified: Secondary | ICD-10-CM | POA: Diagnosis not present

## 2016-08-17 DIAGNOSIS — I6523 Occlusion and stenosis of bilateral carotid arteries: Secondary | ICD-10-CM | POA: Diagnosis not present

## 2016-08-17 DIAGNOSIS — E0829 Diabetes mellitus due to underlying condition with other diabetic kidney complication: Secondary | ICD-10-CM | POA: Diagnosis not present

## 2016-08-17 DIAGNOSIS — R4781 Slurred speech: Secondary | ICD-10-CM | POA: Diagnosis not present

## 2016-08-17 DIAGNOSIS — J029 Acute pharyngitis, unspecified: Secondary | ICD-10-CM | POA: Diagnosis not present

## 2016-08-18 DIAGNOSIS — E785 Hyperlipidemia, unspecified: Secondary | ICD-10-CM | POA: Diagnosis not present

## 2016-08-18 DIAGNOSIS — F8089 Other developmental disorders of speech and language: Secondary | ICD-10-CM | POA: Diagnosis not present

## 2016-08-18 DIAGNOSIS — E0829 Diabetes mellitus due to underlying condition with other diabetic kidney complication: Secondary | ICD-10-CM | POA: Diagnosis not present

## 2016-08-18 DIAGNOSIS — I10 Essential (primary) hypertension: Secondary | ICD-10-CM | POA: Diagnosis not present

## 2016-08-18 DIAGNOSIS — G459 Transient cerebral ischemic attack, unspecified: Secondary | ICD-10-CM | POA: Diagnosis not present

## 2016-08-18 DIAGNOSIS — J029 Acute pharyngitis, unspecified: Secondary | ICD-10-CM | POA: Diagnosis not present

## 2016-08-24 DIAGNOSIS — R2681 Unsteadiness on feet: Secondary | ICD-10-CM | POA: Diagnosis not present

## 2016-08-24 DIAGNOSIS — G459 Transient cerebral ischemic attack, unspecified: Secondary | ICD-10-CM | POA: Diagnosis not present

## 2016-08-24 DIAGNOSIS — R5383 Other fatigue: Secondary | ICD-10-CM | POA: Diagnosis not present

## 2016-08-24 DIAGNOSIS — M6281 Muscle weakness (generalized): Secondary | ICD-10-CM | POA: Diagnosis not present

## 2016-08-24 DIAGNOSIS — R2689 Other abnormalities of gait and mobility: Secondary | ICD-10-CM | POA: Diagnosis not present

## 2016-08-25 DIAGNOSIS — Z8673 Personal history of transient ischemic attack (TIA), and cerebral infarction without residual deficits: Secondary | ICD-10-CM | POA: Diagnosis not present

## 2016-08-25 DIAGNOSIS — E1165 Type 2 diabetes mellitus with hyperglycemia: Secondary | ICD-10-CM | POA: Diagnosis not present

## 2016-08-25 DIAGNOSIS — I10 Essential (primary) hypertension: Secondary | ICD-10-CM | POA: Diagnosis not present

## 2016-08-25 DIAGNOSIS — N179 Acute kidney failure, unspecified: Secondary | ICD-10-CM | POA: Diagnosis not present

## 2016-08-26 DIAGNOSIS — G459 Transient cerebral ischemic attack, unspecified: Secondary | ICD-10-CM | POA: Diagnosis not present

## 2016-08-26 DIAGNOSIS — R5383 Other fatigue: Secondary | ICD-10-CM | POA: Diagnosis not present

## 2016-08-26 DIAGNOSIS — M6281 Muscle weakness (generalized): Secondary | ICD-10-CM | POA: Diagnosis not present

## 2016-08-26 DIAGNOSIS — R2681 Unsteadiness on feet: Secondary | ICD-10-CM | POA: Diagnosis not present

## 2016-08-30 DIAGNOSIS — R2681 Unsteadiness on feet: Secondary | ICD-10-CM | POA: Diagnosis not present

## 2016-08-30 DIAGNOSIS — R5383 Other fatigue: Secondary | ICD-10-CM | POA: Diagnosis not present

## 2016-08-30 DIAGNOSIS — G459 Transient cerebral ischemic attack, unspecified: Secondary | ICD-10-CM | POA: Diagnosis not present

## 2016-08-30 DIAGNOSIS — M6281 Muscle weakness (generalized): Secondary | ICD-10-CM | POA: Diagnosis not present

## 2016-09-03 DIAGNOSIS — M6281 Muscle weakness (generalized): Secondary | ICD-10-CM | POA: Diagnosis not present

## 2016-09-03 DIAGNOSIS — R2681 Unsteadiness on feet: Secondary | ICD-10-CM | POA: Diagnosis not present

## 2016-09-03 DIAGNOSIS — R5383 Other fatigue: Secondary | ICD-10-CM | POA: Diagnosis not present

## 2016-09-03 DIAGNOSIS — G459 Transient cerebral ischemic attack, unspecified: Secondary | ICD-10-CM | POA: Diagnosis not present

## 2016-09-06 DIAGNOSIS — R5383 Other fatigue: Secondary | ICD-10-CM | POA: Diagnosis not present

## 2016-09-06 DIAGNOSIS — G459 Transient cerebral ischemic attack, unspecified: Secondary | ICD-10-CM | POA: Diagnosis not present

## 2016-09-06 DIAGNOSIS — M6281 Muscle weakness (generalized): Secondary | ICD-10-CM | POA: Diagnosis not present

## 2016-09-06 DIAGNOSIS — R2681 Unsteadiness on feet: Secondary | ICD-10-CM | POA: Diagnosis not present

## 2016-09-10 DIAGNOSIS — R5383 Other fatigue: Secondary | ICD-10-CM | POA: Diagnosis not present

## 2016-09-10 DIAGNOSIS — R2681 Unsteadiness on feet: Secondary | ICD-10-CM | POA: Diagnosis not present

## 2016-09-10 DIAGNOSIS — G459 Transient cerebral ischemic attack, unspecified: Secondary | ICD-10-CM | POA: Diagnosis not present

## 2016-09-10 DIAGNOSIS — M6281 Muscle weakness (generalized): Secondary | ICD-10-CM | POA: Diagnosis not present

## 2016-09-13 DIAGNOSIS — G459 Transient cerebral ischemic attack, unspecified: Secondary | ICD-10-CM | POA: Diagnosis not present

## 2016-09-13 DIAGNOSIS — R5383 Other fatigue: Secondary | ICD-10-CM | POA: Diagnosis not present

## 2016-09-13 DIAGNOSIS — R2681 Unsteadiness on feet: Secondary | ICD-10-CM | POA: Diagnosis not present

## 2016-09-13 DIAGNOSIS — M6281 Muscle weakness (generalized): Secondary | ICD-10-CM | POA: Diagnosis not present

## 2016-09-16 DIAGNOSIS — R2681 Unsteadiness on feet: Secondary | ICD-10-CM | POA: Diagnosis not present

## 2016-09-16 DIAGNOSIS — M6281 Muscle weakness (generalized): Secondary | ICD-10-CM | POA: Diagnosis not present

## 2016-09-16 DIAGNOSIS — G459 Transient cerebral ischemic attack, unspecified: Secondary | ICD-10-CM | POA: Diagnosis not present

## 2016-09-16 DIAGNOSIS — R5383 Other fatigue: Secondary | ICD-10-CM | POA: Diagnosis not present

## 2016-09-20 DIAGNOSIS — M6281 Muscle weakness (generalized): Secondary | ICD-10-CM | POA: Diagnosis not present

## 2016-09-20 DIAGNOSIS — G459 Transient cerebral ischemic attack, unspecified: Secondary | ICD-10-CM | POA: Diagnosis not present

## 2016-09-20 DIAGNOSIS — R5383 Other fatigue: Secondary | ICD-10-CM | POA: Diagnosis not present

## 2016-09-20 DIAGNOSIS — R2681 Unsteadiness on feet: Secondary | ICD-10-CM | POA: Diagnosis not present

## 2016-09-24 DIAGNOSIS — H43812 Vitreous degeneration, left eye: Secondary | ICD-10-CM | POA: Diagnosis not present

## 2016-09-24 DIAGNOSIS — G459 Transient cerebral ischemic attack, unspecified: Secondary | ICD-10-CM | POA: Diagnosis not present

## 2016-09-24 DIAGNOSIS — R2681 Unsteadiness on feet: Secondary | ICD-10-CM | POA: Diagnosis not present

## 2016-09-24 DIAGNOSIS — R2689 Other abnormalities of gait and mobility: Secondary | ICD-10-CM | POA: Diagnosis not present

## 2016-09-24 DIAGNOSIS — H3582 Retinal ischemia: Secondary | ICD-10-CM | POA: Diagnosis not present

## 2016-09-24 DIAGNOSIS — E113313 Type 2 diabetes mellitus with moderate nonproliferative diabetic retinopathy with macular edema, bilateral: Secondary | ICD-10-CM | POA: Diagnosis not present

## 2016-09-24 DIAGNOSIS — R5383 Other fatigue: Secondary | ICD-10-CM | POA: Diagnosis not present

## 2016-09-24 DIAGNOSIS — M6281 Muscle weakness (generalized): Secondary | ICD-10-CM | POA: Diagnosis not present

## 2016-09-27 DIAGNOSIS — G459 Transient cerebral ischemic attack, unspecified: Secondary | ICD-10-CM | POA: Diagnosis not present

## 2016-09-27 DIAGNOSIS — R2681 Unsteadiness on feet: Secondary | ICD-10-CM | POA: Diagnosis not present

## 2016-09-27 DIAGNOSIS — R5383 Other fatigue: Secondary | ICD-10-CM | POA: Diagnosis not present

## 2016-09-27 DIAGNOSIS — M6281 Muscle weakness (generalized): Secondary | ICD-10-CM | POA: Diagnosis not present

## 2016-09-27 DIAGNOSIS — R2689 Other abnormalities of gait and mobility: Secondary | ICD-10-CM | POA: Diagnosis not present

## 2016-09-30 DIAGNOSIS — M6281 Muscle weakness (generalized): Secondary | ICD-10-CM | POA: Diagnosis not present

## 2016-09-30 DIAGNOSIS — R2689 Other abnormalities of gait and mobility: Secondary | ICD-10-CM | POA: Diagnosis not present

## 2016-09-30 DIAGNOSIS — G459 Transient cerebral ischemic attack, unspecified: Secondary | ICD-10-CM | POA: Diagnosis not present

## 2016-09-30 DIAGNOSIS — R2681 Unsteadiness on feet: Secondary | ICD-10-CM | POA: Diagnosis not present

## 2016-09-30 DIAGNOSIS — R5383 Other fatigue: Secondary | ICD-10-CM | POA: Diagnosis not present

## 2016-10-01 DIAGNOSIS — E113311 Type 2 diabetes mellitus with moderate nonproliferative diabetic retinopathy with macular edema, right eye: Secondary | ICD-10-CM | POA: Diagnosis not present

## 2016-10-04 DIAGNOSIS — M6281 Muscle weakness (generalized): Secondary | ICD-10-CM | POA: Diagnosis not present

## 2016-10-04 DIAGNOSIS — R2681 Unsteadiness on feet: Secondary | ICD-10-CM | POA: Diagnosis not present

## 2016-10-04 DIAGNOSIS — R5383 Other fatigue: Secondary | ICD-10-CM | POA: Diagnosis not present

## 2016-10-04 DIAGNOSIS — R2689 Other abnormalities of gait and mobility: Secondary | ICD-10-CM | POA: Diagnosis not present

## 2016-10-04 DIAGNOSIS — G459 Transient cerebral ischemic attack, unspecified: Secondary | ICD-10-CM | POA: Diagnosis not present

## 2016-10-07 DIAGNOSIS — G459 Transient cerebral ischemic attack, unspecified: Secondary | ICD-10-CM | POA: Diagnosis not present

## 2016-10-07 DIAGNOSIS — R2681 Unsteadiness on feet: Secondary | ICD-10-CM | POA: Diagnosis not present

## 2016-10-07 DIAGNOSIS — M6281 Muscle weakness (generalized): Secondary | ICD-10-CM | POA: Diagnosis not present

## 2016-10-07 DIAGNOSIS — R5383 Other fatigue: Secondary | ICD-10-CM | POA: Diagnosis not present

## 2016-10-07 DIAGNOSIS — R2689 Other abnormalities of gait and mobility: Secondary | ICD-10-CM | POA: Diagnosis not present

## 2016-10-11 DIAGNOSIS — R2681 Unsteadiness on feet: Secondary | ICD-10-CM | POA: Diagnosis not present

## 2016-10-11 DIAGNOSIS — R5383 Other fatigue: Secondary | ICD-10-CM | POA: Diagnosis not present

## 2016-10-11 DIAGNOSIS — M6281 Muscle weakness (generalized): Secondary | ICD-10-CM | POA: Diagnosis not present

## 2016-10-11 DIAGNOSIS — G459 Transient cerebral ischemic attack, unspecified: Secondary | ICD-10-CM | POA: Diagnosis not present

## 2016-10-11 DIAGNOSIS — R2689 Other abnormalities of gait and mobility: Secondary | ICD-10-CM | POA: Diagnosis not present

## 2016-10-14 DIAGNOSIS — G459 Transient cerebral ischemic attack, unspecified: Secondary | ICD-10-CM | POA: Diagnosis not present

## 2016-10-14 DIAGNOSIS — R2681 Unsteadiness on feet: Secondary | ICD-10-CM | POA: Diagnosis not present

## 2016-10-14 DIAGNOSIS — R5383 Other fatigue: Secondary | ICD-10-CM | POA: Diagnosis not present

## 2016-10-14 DIAGNOSIS — R2689 Other abnormalities of gait and mobility: Secondary | ICD-10-CM | POA: Diagnosis not present

## 2016-10-14 DIAGNOSIS — M6281 Muscle weakness (generalized): Secondary | ICD-10-CM | POA: Diagnosis not present

## 2016-10-15 DIAGNOSIS — E113312 Type 2 diabetes mellitus with moderate nonproliferative diabetic retinopathy with macular edema, left eye: Secondary | ICD-10-CM | POA: Diagnosis not present

## 2016-10-18 DIAGNOSIS — M6281 Muscle weakness (generalized): Secondary | ICD-10-CM | POA: Diagnosis not present

## 2016-10-18 DIAGNOSIS — R2681 Unsteadiness on feet: Secondary | ICD-10-CM | POA: Diagnosis not present

## 2016-10-18 DIAGNOSIS — R5383 Other fatigue: Secondary | ICD-10-CM | POA: Diagnosis not present

## 2016-10-18 DIAGNOSIS — G459 Transient cerebral ischemic attack, unspecified: Secondary | ICD-10-CM | POA: Diagnosis not present

## 2016-10-18 DIAGNOSIS — R2689 Other abnormalities of gait and mobility: Secondary | ICD-10-CM | POA: Diagnosis not present

## 2016-10-21 DIAGNOSIS — R2689 Other abnormalities of gait and mobility: Secondary | ICD-10-CM | POA: Diagnosis not present

## 2016-10-21 DIAGNOSIS — R5383 Other fatigue: Secondary | ICD-10-CM | POA: Diagnosis not present

## 2016-10-21 DIAGNOSIS — M6281 Muscle weakness (generalized): Secondary | ICD-10-CM | POA: Diagnosis not present

## 2016-10-21 DIAGNOSIS — G459 Transient cerebral ischemic attack, unspecified: Secondary | ICD-10-CM | POA: Diagnosis not present

## 2016-10-21 DIAGNOSIS — R2681 Unsteadiness on feet: Secondary | ICD-10-CM | POA: Diagnosis not present

## 2016-10-26 DIAGNOSIS — R5383 Other fatigue: Secondary | ICD-10-CM | POA: Diagnosis not present

## 2016-10-26 DIAGNOSIS — G459 Transient cerebral ischemic attack, unspecified: Secondary | ICD-10-CM | POA: Diagnosis not present

## 2016-10-26 DIAGNOSIS — Z8673 Personal history of transient ischemic attack (TIA), and cerebral infarction without residual deficits: Secondary | ICD-10-CM | POA: Diagnosis not present

## 2016-10-26 DIAGNOSIS — R2681 Unsteadiness on feet: Secondary | ICD-10-CM | POA: Diagnosis not present

## 2016-10-26 DIAGNOSIS — R2689 Other abnormalities of gait and mobility: Secondary | ICD-10-CM | POA: Diagnosis not present

## 2016-10-26 DIAGNOSIS — M6281 Muscle weakness (generalized): Secondary | ICD-10-CM | POA: Diagnosis not present

## 2016-10-28 DIAGNOSIS — R5383 Other fatigue: Secondary | ICD-10-CM | POA: Diagnosis not present

## 2016-10-28 DIAGNOSIS — Z8673 Personal history of transient ischemic attack (TIA), and cerebral infarction without residual deficits: Secondary | ICD-10-CM | POA: Diagnosis not present

## 2016-10-28 DIAGNOSIS — R2689 Other abnormalities of gait and mobility: Secondary | ICD-10-CM | POA: Diagnosis not present

## 2016-10-28 DIAGNOSIS — M6281 Muscle weakness (generalized): Secondary | ICD-10-CM | POA: Diagnosis not present

## 2016-10-28 DIAGNOSIS — R2681 Unsteadiness on feet: Secondary | ICD-10-CM | POA: Diagnosis not present

## 2016-10-28 DIAGNOSIS — G459 Transient cerebral ischemic attack, unspecified: Secondary | ICD-10-CM | POA: Diagnosis not present

## 2016-11-01 DIAGNOSIS — R5383 Other fatigue: Secondary | ICD-10-CM | POA: Diagnosis not present

## 2016-11-01 DIAGNOSIS — R2689 Other abnormalities of gait and mobility: Secondary | ICD-10-CM | POA: Diagnosis not present

## 2016-11-01 DIAGNOSIS — G459 Transient cerebral ischemic attack, unspecified: Secondary | ICD-10-CM | POA: Diagnosis not present

## 2016-11-01 DIAGNOSIS — M6281 Muscle weakness (generalized): Secondary | ICD-10-CM | POA: Diagnosis not present

## 2016-11-01 DIAGNOSIS — Z8673 Personal history of transient ischemic attack (TIA), and cerebral infarction without residual deficits: Secondary | ICD-10-CM | POA: Diagnosis not present

## 2016-11-01 DIAGNOSIS — R2681 Unsteadiness on feet: Secondary | ICD-10-CM | POA: Diagnosis not present

## 2016-11-03 DIAGNOSIS — Z8673 Personal history of transient ischemic attack (TIA), and cerebral infarction without residual deficits: Secondary | ICD-10-CM | POA: Diagnosis not present

## 2016-11-03 DIAGNOSIS — R2681 Unsteadiness on feet: Secondary | ICD-10-CM | POA: Diagnosis not present

## 2016-11-03 DIAGNOSIS — G459 Transient cerebral ischemic attack, unspecified: Secondary | ICD-10-CM | POA: Diagnosis not present

## 2016-11-03 DIAGNOSIS — R2689 Other abnormalities of gait and mobility: Secondary | ICD-10-CM | POA: Diagnosis not present

## 2016-11-03 DIAGNOSIS — R5383 Other fatigue: Secondary | ICD-10-CM | POA: Diagnosis not present

## 2016-11-03 DIAGNOSIS — M6281 Muscle weakness (generalized): Secondary | ICD-10-CM | POA: Diagnosis not present

## 2016-11-08 DIAGNOSIS — R2681 Unsteadiness on feet: Secondary | ICD-10-CM | POA: Diagnosis not present

## 2016-11-08 DIAGNOSIS — G459 Transient cerebral ischemic attack, unspecified: Secondary | ICD-10-CM | POA: Diagnosis not present

## 2016-11-08 DIAGNOSIS — M6281 Muscle weakness (generalized): Secondary | ICD-10-CM | POA: Diagnosis not present

## 2016-11-08 DIAGNOSIS — Z8673 Personal history of transient ischemic attack (TIA), and cerebral infarction without residual deficits: Secondary | ICD-10-CM | POA: Diagnosis not present

## 2016-11-08 DIAGNOSIS — R2689 Other abnormalities of gait and mobility: Secondary | ICD-10-CM | POA: Diagnosis not present

## 2016-11-08 DIAGNOSIS — R5383 Other fatigue: Secondary | ICD-10-CM | POA: Diagnosis not present

## 2016-11-10 DIAGNOSIS — G459 Transient cerebral ischemic attack, unspecified: Secondary | ICD-10-CM | POA: Diagnosis not present

## 2016-11-10 DIAGNOSIS — M6281 Muscle weakness (generalized): Secondary | ICD-10-CM | POA: Diagnosis not present

## 2016-11-10 DIAGNOSIS — Z8673 Personal history of transient ischemic attack (TIA), and cerebral infarction without residual deficits: Secondary | ICD-10-CM | POA: Diagnosis not present

## 2016-11-10 DIAGNOSIS — R2689 Other abnormalities of gait and mobility: Secondary | ICD-10-CM | POA: Diagnosis not present

## 2016-11-10 DIAGNOSIS — R5383 Other fatigue: Secondary | ICD-10-CM | POA: Diagnosis not present

## 2016-11-10 DIAGNOSIS — R2681 Unsteadiness on feet: Secondary | ICD-10-CM | POA: Diagnosis not present

## 2016-11-15 DIAGNOSIS — R5383 Other fatigue: Secondary | ICD-10-CM | POA: Diagnosis not present

## 2016-11-15 DIAGNOSIS — M6281 Muscle weakness (generalized): Secondary | ICD-10-CM | POA: Diagnosis not present

## 2016-11-15 DIAGNOSIS — R2681 Unsteadiness on feet: Secondary | ICD-10-CM | POA: Diagnosis not present

## 2016-11-15 DIAGNOSIS — Z8673 Personal history of transient ischemic attack (TIA), and cerebral infarction without residual deficits: Secondary | ICD-10-CM | POA: Diagnosis not present

## 2016-11-15 DIAGNOSIS — G459 Transient cerebral ischemic attack, unspecified: Secondary | ICD-10-CM | POA: Diagnosis not present

## 2016-11-15 DIAGNOSIS — R2689 Other abnormalities of gait and mobility: Secondary | ICD-10-CM | POA: Diagnosis not present

## 2016-11-16 DIAGNOSIS — H3582 Retinal ischemia: Secondary | ICD-10-CM | POA: Diagnosis not present

## 2016-11-16 DIAGNOSIS — E113313 Type 2 diabetes mellitus with moderate nonproliferative diabetic retinopathy with macular edema, bilateral: Secondary | ICD-10-CM | POA: Diagnosis not present

## 2016-11-16 DIAGNOSIS — H43812 Vitreous degeneration, left eye: Secondary | ICD-10-CM | POA: Diagnosis not present

## 2016-11-17 DIAGNOSIS — R2681 Unsteadiness on feet: Secondary | ICD-10-CM | POA: Diagnosis not present

## 2016-11-17 DIAGNOSIS — G459 Transient cerebral ischemic attack, unspecified: Secondary | ICD-10-CM | POA: Diagnosis not present

## 2016-11-17 DIAGNOSIS — Z8673 Personal history of transient ischemic attack (TIA), and cerebral infarction without residual deficits: Secondary | ICD-10-CM | POA: Diagnosis not present

## 2016-11-17 DIAGNOSIS — M6281 Muscle weakness (generalized): Secondary | ICD-10-CM | POA: Diagnosis not present

## 2016-11-17 DIAGNOSIS — R5383 Other fatigue: Secondary | ICD-10-CM | POA: Diagnosis not present

## 2016-11-17 DIAGNOSIS — R2689 Other abnormalities of gait and mobility: Secondary | ICD-10-CM | POA: Diagnosis not present

## 2016-11-24 DIAGNOSIS — M6281 Muscle weakness (generalized): Secondary | ICD-10-CM | POA: Diagnosis not present

## 2016-11-24 DIAGNOSIS — G459 Transient cerebral ischemic attack, unspecified: Secondary | ICD-10-CM | POA: Diagnosis not present

## 2016-11-24 DIAGNOSIS — R2681 Unsteadiness on feet: Secondary | ICD-10-CM | POA: Diagnosis not present

## 2016-11-24 DIAGNOSIS — R5383 Other fatigue: Secondary | ICD-10-CM | POA: Diagnosis not present

## 2016-11-24 DIAGNOSIS — M25661 Stiffness of right knee, not elsewhere classified: Secondary | ICD-10-CM | POA: Diagnosis not present

## 2016-11-24 DIAGNOSIS — M79604 Pain in right leg: Secondary | ICD-10-CM | POA: Diagnosis not present

## 2016-11-24 DIAGNOSIS — M25651 Stiffness of right hip, not elsewhere classified: Secondary | ICD-10-CM | POA: Diagnosis not present

## 2016-11-24 DIAGNOSIS — R2689 Other abnormalities of gait and mobility: Secondary | ICD-10-CM | POA: Diagnosis not present

## 2016-12-08 DIAGNOSIS — M25651 Stiffness of right hip, not elsewhere classified: Secondary | ICD-10-CM | POA: Diagnosis not present

## 2016-12-08 DIAGNOSIS — R2689 Other abnormalities of gait and mobility: Secondary | ICD-10-CM | POA: Diagnosis not present

## 2016-12-08 DIAGNOSIS — M6281 Muscle weakness (generalized): Secondary | ICD-10-CM | POA: Diagnosis not present

## 2016-12-08 DIAGNOSIS — M79604 Pain in right leg: Secondary | ICD-10-CM | POA: Diagnosis not present

## 2016-12-08 DIAGNOSIS — M25661 Stiffness of right knee, not elsewhere classified: Secondary | ICD-10-CM | POA: Diagnosis not present

## 2016-12-08 DIAGNOSIS — R2681 Unsteadiness on feet: Secondary | ICD-10-CM | POA: Diagnosis not present

## 2016-12-08 DIAGNOSIS — R5383 Other fatigue: Secondary | ICD-10-CM | POA: Diagnosis not present

## 2016-12-08 DIAGNOSIS — G459 Transient cerebral ischemic attack, unspecified: Secondary | ICD-10-CM | POA: Diagnosis not present

## 2016-12-13 DIAGNOSIS — E1165 Type 2 diabetes mellitus with hyperglycemia: Secondary | ICD-10-CM | POA: Diagnosis not present

## 2016-12-17 DIAGNOSIS — E782 Mixed hyperlipidemia: Secondary | ICD-10-CM | POA: Diagnosis not present

## 2016-12-17 DIAGNOSIS — E1165 Type 2 diabetes mellitus with hyperglycemia: Secondary | ICD-10-CM | POA: Diagnosis not present

## 2016-12-17 DIAGNOSIS — I129 Hypertensive chronic kidney disease with stage 1 through stage 4 chronic kidney disease, or unspecified chronic kidney disease: Secondary | ICD-10-CM | POA: Diagnosis not present

## 2016-12-17 DIAGNOSIS — E1129 Type 2 diabetes mellitus with other diabetic kidney complication: Secondary | ICD-10-CM | POA: Diagnosis not present

## 2016-12-17 DIAGNOSIS — I10 Essential (primary) hypertension: Secondary | ICD-10-CM | POA: Diagnosis not present

## 2016-12-22 DIAGNOSIS — R2681 Unsteadiness on feet: Secondary | ICD-10-CM | POA: Diagnosis not present

## 2016-12-22 DIAGNOSIS — M79604 Pain in right leg: Secondary | ICD-10-CM | POA: Diagnosis not present

## 2016-12-22 DIAGNOSIS — R2689 Other abnormalities of gait and mobility: Secondary | ICD-10-CM | POA: Diagnosis not present

## 2016-12-22 DIAGNOSIS — M25661 Stiffness of right knee, not elsewhere classified: Secondary | ICD-10-CM | POA: Diagnosis not present

## 2016-12-22 DIAGNOSIS — G459 Transient cerebral ischemic attack, unspecified: Secondary | ICD-10-CM | POA: Diagnosis not present

## 2016-12-22 DIAGNOSIS — R5383 Other fatigue: Secondary | ICD-10-CM | POA: Diagnosis not present

## 2016-12-22 DIAGNOSIS — M25651 Stiffness of right hip, not elsewhere classified: Secondary | ICD-10-CM | POA: Diagnosis not present

## 2016-12-22 DIAGNOSIS — M6281 Muscle weakness (generalized): Secondary | ICD-10-CM | POA: Diagnosis not present

## 2016-12-30 ENCOUNTER — Encounter: Payer: Self-pay | Admitting: Hematology and Oncology

## 2016-12-30 DIAGNOSIS — Z853 Personal history of malignant neoplasm of breast: Secondary | ICD-10-CM | POA: Diagnosis not present

## 2016-12-30 DIAGNOSIS — R928 Other abnormal and inconclusive findings on diagnostic imaging of breast: Secondary | ICD-10-CM | POA: Diagnosis not present

## 2016-12-30 DIAGNOSIS — M8589 Other specified disorders of bone density and structure, multiple sites: Secondary | ICD-10-CM | POA: Diagnosis not present

## 2016-12-30 NOTE — Progress Notes (Signed)
Received [facility] from [facility]. Sent to scan

## 2017-01-18 DIAGNOSIS — E113313 Type 2 diabetes mellitus with moderate nonproliferative diabetic retinopathy with macular edema, bilateral: Secondary | ICD-10-CM | POA: Diagnosis not present

## 2017-01-18 DIAGNOSIS — H43812 Vitreous degeneration, left eye: Secondary | ICD-10-CM | POA: Diagnosis not present

## 2017-01-18 DIAGNOSIS — H3582 Retinal ischemia: Secondary | ICD-10-CM | POA: Diagnosis not present

## 2017-02-08 DIAGNOSIS — E113311 Type 2 diabetes mellitus with moderate nonproliferative diabetic retinopathy with macular edema, right eye: Secondary | ICD-10-CM | POA: Diagnosis not present

## 2017-04-05 DIAGNOSIS — E113311 Type 2 diabetes mellitus with moderate nonproliferative diabetic retinopathy with macular edema, right eye: Secondary | ICD-10-CM | POA: Diagnosis not present

## 2017-04-05 DIAGNOSIS — H4312 Vitreous hemorrhage, left eye: Secondary | ICD-10-CM | POA: Diagnosis not present

## 2017-04-05 DIAGNOSIS — E113512 Type 2 diabetes mellitus with proliferative diabetic retinopathy with macular edema, left eye: Secondary | ICD-10-CM | POA: Diagnosis not present

## 2017-04-13 ENCOUNTER — Telehealth: Payer: Self-pay

## 2017-04-13 ENCOUNTER — Ambulatory Visit (HOSPITAL_BASED_OUTPATIENT_CLINIC_OR_DEPARTMENT_OTHER): Payer: Medicare HMO | Admitting: Hematology and Oncology

## 2017-04-13 ENCOUNTER — Encounter: Payer: Self-pay | Admitting: Hematology and Oncology

## 2017-04-13 DIAGNOSIS — M8588 Other specified disorders of bone density and structure, other site: Secondary | ICD-10-CM

## 2017-04-13 DIAGNOSIS — C50911 Malignant neoplasm of unspecified site of right female breast: Secondary | ICD-10-CM

## 2017-04-13 DIAGNOSIS — E113311 Type 2 diabetes mellitus with moderate nonproliferative diabetic retinopathy with macular edema, right eye: Secondary | ICD-10-CM | POA: Diagnosis not present

## 2017-04-13 MED ORDER — LETROZOLE 2.5 MG PO TABS: 2.5000 mg | ORAL_TABLET | Freq: Every day | ORAL | 3 refills | Status: DC

## 2017-04-13 NOTE — Telephone Encounter (Signed)
Called patient regarding her bone density scan results. Explained to her results were in good standing and continue to exercise as tolerated and each foods nutritious in calcium. Pt verbalized understanding.  Cyndia Bent RN

## 2017-04-13 NOTE — Telephone Encounter (Signed)
Printer avs and calender for upcoming appointment in 1 year/ Per 9/19 los

## 2017-04-13 NOTE — Assessment & Plan Note (Signed)
Right breast invasive lobular carcinoma status post neoadjuvant hormonal therapy followed by lumpectomy and radiation therapy. Patient had extensive lymph node involvement 7/8 positive lymph nodes. At that time she Oncotype DX that revealed low risk of recurrence and she did not get chemotherapy.  Current treatment: Letrozole 2.5 mg daily since 2012 Because of her lobular breast cancer and because she had 7 positive lymph nodes and recommended extended adjuvant therapy beyond 5 years.  Letrozole toxicities: 1. Occasional hot flashes.  2. Osteopenia : Takes calcium with vitamin D being monitored with bone density  Breast cancer surveillance: 1. Breast exam 04/13/2017 : No palpable lumps or nodules. Specifically changed he noticed of the cross the most is a pleasant Caucasian 2. Mammogramand ultrasound at [facility] 12/23/2016 palpable abnormality in the right axilla ultrasound biopsy was performed which showed dense stromal fibrosis consistent with scar no evidence of malignancy. 3. Bone density May 2016: T score -1.2 return to clinic in One year for follow-up

## 2017-04-13 NOTE — Progress Notes (Signed)
Patient Care Team: [name], MD as PCP - General (Family Medicine)  DIAGNOSIS:  Encounter Diagnosis  Name Primary?  . Lobular carcinoma of right breast ([place])     SUMMARY OF ONCOLOGIC HISTORY:   Lobular carcinoma of right breast ([place])   08/03/2010 Mammogram    3.4 cm area of density involving the skin and the axillary crease on the right breast.  An ultrasound was performed showing a 2 cm mass       08/19/2010 Initial Diagnosis    invasive mammary carcinoma with lobular features, ER 95%, PR 94%, Ki67 6%, HER2 negative at 0.85      08/25/2010 Breast MRI    8 x 4.1 x 4.0 cm mass in the upper outer quadrant of the right breast extending to the skin and with associated skin dimpling      08/26/2010 - 01/24/2011 Anti-estrogen oral therapy    Letrozole 2.5 mg daily for neoadjuvant therapy      01/24/2011 Breast MRI    complete resolution of abnormal enhancement in the right axillary tail compatible with tumor treatment response      03/26/2011 Surgery    R Lumpectomy; Inv lobular cancer 4.5 cm with tumor at anterior margin, 3/4 LN positive; Reexcision no additional tumor. Oncotype 18. DId not need chemotherapy       - 07/27/2011 Radiation Therapy    Radiation in [place]      09/02/2011 -  Anti-estrogen oral therapy    Letrozole 2.5 mg daily       CHIEF COMPLIANT: Follow-up on letrozole therapy  INTERVAL HISTORY: [name] is a 76 year old with above-mentioned history of right breast cancer treated with lumpectomy and radiation and is currently on letrozole therapy since 2013. She is tolerating letrozole extremely well without any problems or concerns. She denies any hot flashes or myalgias. She has intermittent pain in the breast along the surgical scars but it does not seem to be bothering her.  REVIEW OF SYSTEMS:   Constitutional: Denies fevers, chills or abnormal weight loss Eyes: Denies blurriness of vision Ears, nose, mouth, throat, and face: Denies mucositis or sore  throat Respiratory: Denies cough, dyspnea or wheezes Cardiovascular: Denies palpitation, chest discomfort Gastrointestinal:  Denies nausea, heartburn or change in bowel habits Skin: Denies abnormal skin rashes Lymphatics: Denies new lymphadenopathy or easy bruising Neurological:Denies numbness, tingling or new weaknesses Behavioral/Psych: Mood is stable, no new changes  Extremities: No lower extremity edema Breast: Intermittent pain in the right breast All other systems were reviewed with the patient and are negative.  I have reviewed the past medical history, past surgical history, social history and family history with the patient and they are unchanged from previous note.  ALLERGIES:  has No Known Allergies.  MEDICATIONS:  Current Outpatient Prescriptions  Medication Sig Dispense Refill  . atorvastatin (LIPITOR) 40 MG tablet Take 40 mg by mouth daily. Take 1/2 pill daily    . B Complex-C (SUPER B COMPLEX PO) Take 1,000 mg by mouth daily.     . Calcium Carbonate (CALTRATE 600 PO) Take 1,200 mg by mouth 2 (two) times daily.     . Cholecalciferol (VITAMIN D) 2000 UNITS tablet Take 4,000 Units by mouth daily.    . fluticasone (FLONASE) 50 MCG/ACT nasal spray as needed.    . furosemide (LASIX) 40 MG tablet Take 40 mg by mouth as needed.     . Insulin Glargine (TOUJEO SOLOSTAR [place]) Inject 50 Units into the skin.    Marland Kitchen  letrozole (FEMARA) 2.5 MG tablet Take 1 tablet (2.5 mg total) by mouth daily. 90 tablet 0  . LEVEMIR FLEXTOUCH 100 UNIT/ML Pen 75/25    . metFORMIN (GLUCOPHAGE-XR) 500 MG 24 hr tablet 500 mg daily with breakfast.     . metoprolol succinate (TOPROL-XL) 50 MG 24 hr tablet 50 mg daily.     Marland Kitchen olmesartan (BENICAR) 40 MG tablet Take 40 mg by mouth daily.     No current facility-administered medications for this visit.     PHYSICAL EXAMINATION: ECOG PERFORMANCE STATUS: 1 - Symptomatic but completely ambulatory  Vitals:   04/13/17 1010  BP: (!) 184/90  Pulse: 80  Resp: 17    Temp: 97.7 F (36.5 C)  SpO2: 99%   Filed Weights   04/13/17 1010  Weight: 163 lb (73.9 kg)    GENERAL:alert, no distress and comfortable SKIN: skin color, texture, turgor are normal, no rashes or significant lesions EYES: normal, Conjunctiva are pink and non-injected, sclera clear OROPHARYNX:no exudate, no erythema and lips, buccal mucosa, and tongue normal  NECK: supple, thyroid normal size, non-tender, without nodularity LYMPH:  no palpable lymphadenopathy in the cervical, axillary or inguinal LUNGS: clear to auscultation and percussion with normal breathing effort HEART: regular rate & rhythm and no murmurs and no lower extremity edema ABDOMEN:abdomen soft, non-tender and normal bowel sounds MUSCULOSKELETAL:no cyanosis of digits and no clubbing  NEURO: alert & oriented x 3 with fluent speech, no focal motor/sensory deficits EXTREMITIES: No lower extremity edema BREAST: No palpable masses or nodules in either right or left breasts. No palpable axillary supraclavicular or infraclavicular adenopathy no breast tenderness or nipple discharge. (exam performed in the presence of a chaperone)  LABORATORY DATA:  I have reviewed the data as listed   Chemistry      Component Value Date/Time   NA 138 04/14/2015 0912   K 4.0 04/14/2015 0912   CL 97 08/31/2011 0925   CO2 27 04/14/2015 0912   BUN 13.0 04/14/2015 0912   CREATININE 1.3 (H) 04/14/2015 0912      Component Value Date/Time   CALCIUM 10.3 04/14/2015 0912   ALKPHOS 139 04/14/2015 0912   AST 13 04/14/2015 0912   ALT 17 04/14/2015 0912   BILITOT 0.79 04/14/2015 0912      Lab Results  Component Value Date   WBC 5.9 04/14/2015   HGB 14.5 04/14/2015   HCT 42.4 04/14/2015   MCV 88.7 04/14/2015   PLT 197 04/14/2015   NEUTROABS 4.0 04/14/2015    ASSESSMENT & PLAN:  Lobular carcinoma of right breast Right breast invasive lobular carcinoma status post neoadjuvant hormonal therapy followed by lumpectomy and radiation  therapy. Patient had extensive lymph node involvement 7/8 positive lymph nodes. At that time she Oncotype DX that revealed low risk of recurrence and she did not get chemotherapy.  Current treatment: Letrozole 2.5 mg daily since 2012 Because of her lobular breast cancer and because she had 7 positive lymph nodes and recommended extended adjuvant therapy beyond 5 years.  Letrozole toxicities: 1. Occasional hot flashes.  2. Osteopenia : Takes calcium with vitamin D being monitored with bone density  Breast cancer surveillance: 1. Breast exam 04/13/2017 : No palpable lumps or nodules. Specifically changed he noticed of the cross the most is a pleasant Caucasian 2. Mammogram06/01/2017 at [facility]: Benign, breast density category B  3. Bone density 12/30/2016: T score -1.4 osteopenia   return to clinic in One year for follow-up  I spent 25 minutes talking to the patient  of which more than half was spent in counseling and coordination of care.  No orders of the defined types were placed in this encounter.  The patient has a good understanding of the overall plan. she agrees with it. she will call with any problems that may develop before the next visit here.   Rulon Eisenmenger, MD 04/13/17

## 2017-05-06 DIAGNOSIS — E113512 Type 2 diabetes mellitus with proliferative diabetic retinopathy with macular edema, left eye: Secondary | ICD-10-CM | POA: Diagnosis not present

## 2017-05-06 DIAGNOSIS — H4312 Vitreous hemorrhage, left eye: Secondary | ICD-10-CM | POA: Diagnosis not present

## 2017-05-06 DIAGNOSIS — H3582 Retinal ischemia: Secondary | ICD-10-CM | POA: Diagnosis not present

## 2017-05-06 DIAGNOSIS — E113311 Type 2 diabetes mellitus with moderate nonproliferative diabetic retinopathy with macular edema, right eye: Secondary | ICD-10-CM | POA: Diagnosis not present

## 2017-05-12 DIAGNOSIS — H35372 Puckering of macula, left eye: Secondary | ICD-10-CM | POA: Diagnosis not present

## 2017-05-12 DIAGNOSIS — E113512 Type 2 diabetes mellitus with proliferative diabetic retinopathy with macular edema, left eye: Secondary | ICD-10-CM | POA: Diagnosis not present

## 2017-05-12 DIAGNOSIS — H4312 Vitreous hemorrhage, left eye: Secondary | ICD-10-CM | POA: Diagnosis not present

## 2017-05-13 DIAGNOSIS — H4312 Vitreous hemorrhage, left eye: Secondary | ICD-10-CM | POA: Diagnosis not present

## 2017-05-13 DIAGNOSIS — E113512 Type 2 diabetes mellitus with proliferative diabetic retinopathy with macular edema, left eye: Secondary | ICD-10-CM | POA: Diagnosis not present

## 2017-05-13 DIAGNOSIS — H35372 Puckering of macula, left eye: Secondary | ICD-10-CM | POA: Diagnosis not present

## 2017-05-23 DIAGNOSIS — E113512 Type 2 diabetes mellitus with proliferative diabetic retinopathy with macular edema, left eye: Secondary | ICD-10-CM | POA: Diagnosis not present

## 2017-05-23 DIAGNOSIS — H35372 Puckering of macula, left eye: Secondary | ICD-10-CM | POA: Diagnosis not present

## 2017-06-10 DIAGNOSIS — Z86010 Personal history of colonic polyps: Secondary | ICD-10-CM | POA: Diagnosis not present

## 2017-06-10 DIAGNOSIS — Z1211 Encounter for screening for malignant neoplasm of colon: Secondary | ICD-10-CM | POA: Diagnosis not present

## 2017-06-14 DIAGNOSIS — H35372 Puckering of macula, left eye: Secondary | ICD-10-CM | POA: Diagnosis not present

## 2017-06-14 DIAGNOSIS — E113512 Type 2 diabetes mellitus with proliferative diabetic retinopathy with macular edema, left eye: Secondary | ICD-10-CM | POA: Diagnosis not present

## 2017-06-15 NOTE — Telephone Encounter (Signed)
error 

## 2017-07-11 DIAGNOSIS — C50919 Malignant neoplasm of unspecified site of unspecified female breast: Secondary | ICD-10-CM | POA: Diagnosis not present

## 2017-08-23 DIAGNOSIS — M25551 Pain in right hip: Secondary | ICD-10-CM | POA: Diagnosis not present

## 2017-08-23 DIAGNOSIS — R300 Dysuria: Secondary | ICD-10-CM | POA: Diagnosis not present

## 2017-08-23 DIAGNOSIS — N3000 Acute cystitis without hematuria: Secondary | ICD-10-CM | POA: Diagnosis not present

## 2017-08-23 DIAGNOSIS — N3091 Cystitis, unspecified with hematuria: Secondary | ICD-10-CM | POA: Diagnosis not present

## 2017-08-24 DIAGNOSIS — H3582 Retinal ischemia: Secondary | ICD-10-CM | POA: Diagnosis not present

## 2017-08-24 DIAGNOSIS — E113513 Type 2 diabetes mellitus with proliferative diabetic retinopathy with macular edema, bilateral: Secondary | ICD-10-CM | POA: Diagnosis not present

## 2017-09-02 DIAGNOSIS — I639 Cerebral infarction, unspecified: Secondary | ICD-10-CM | POA: Diagnosis not present

## 2017-09-02 DIAGNOSIS — I63412 Cerebral infarction due to embolism of left middle cerebral artery: Secondary | ICD-10-CM | POA: Diagnosis not present

## 2017-09-02 DIAGNOSIS — E785 Hyperlipidemia, unspecified: Secondary | ICD-10-CM | POA: Diagnosis not present

## 2017-09-02 DIAGNOSIS — J9811 Atelectasis: Secondary | ICD-10-CM | POA: Diagnosis not present

## 2017-09-02 DIAGNOSIS — G459 Transient cerebral ischemic attack, unspecified: Secondary | ICD-10-CM | POA: Diagnosis not present

## 2017-09-02 DIAGNOSIS — M79605 Pain in left leg: Secondary | ICD-10-CM | POA: Diagnosis not present

## 2017-09-02 DIAGNOSIS — E876 Hypokalemia: Secondary | ICD-10-CM | POA: Diagnosis not present

## 2017-09-02 DIAGNOSIS — R9431 Abnormal electrocardiogram [ECG] [EKG]: Secondary | ICD-10-CM | POA: Diagnosis not present

## 2017-09-02 DIAGNOSIS — R4701 Aphasia: Secondary | ICD-10-CM | POA: Diagnosis not present

## 2017-09-02 DIAGNOSIS — S79912A Unspecified injury of left hip, initial encounter: Secondary | ICD-10-CM | POA: Diagnosis not present

## 2017-09-02 DIAGNOSIS — I129 Hypertensive chronic kidney disease with stage 1 through stage 4 chronic kidney disease, or unspecified chronic kidney disease: Secondary | ICD-10-CM | POA: Diagnosis not present

## 2017-09-02 DIAGNOSIS — G458 Other transient cerebral ischemic attacks and related syndromes: Secondary | ICD-10-CM | POA: Diagnosis not present

## 2017-09-02 DIAGNOSIS — N183 Chronic kidney disease, stage 3 (moderate): Secondary | ICD-10-CM | POA: Diagnosis not present

## 2017-09-02 DIAGNOSIS — S299XXA Unspecified injury of thorax, initial encounter: Secondary | ICD-10-CM | POA: Diagnosis not present

## 2017-09-02 DIAGNOSIS — S728X9A Other fracture of unspecified femur, initial encounter for closed fracture: Secondary | ICD-10-CM | POA: Diagnosis not present

## 2017-09-02 DIAGNOSIS — Y9301 Activity, walking, marching and hiking: Secondary | ICD-10-CM | POA: Diagnosis not present

## 2017-09-02 DIAGNOSIS — J9601 Acute respiratory failure with hypoxia: Secondary | ICD-10-CM | POA: Diagnosis not present

## 2017-09-02 DIAGNOSIS — S72012D Unspecified intracapsular fracture of left femur, subsequent encounter for closed fracture with routine healing: Secondary | ICD-10-CM | POA: Diagnosis not present

## 2017-09-02 DIAGNOSIS — E1122 Type 2 diabetes mellitus with diabetic chronic kidney disease: Secondary | ICD-10-CM | POA: Diagnosis not present

## 2017-09-02 DIAGNOSIS — G8911 Acute pain due to trauma: Secondary | ICD-10-CM | POA: Diagnosis not present

## 2017-09-02 DIAGNOSIS — E118 Type 2 diabetes mellitus with unspecified complications: Secondary | ICD-10-CM | POA: Diagnosis not present

## 2017-09-02 DIAGNOSIS — E1165 Type 2 diabetes mellitus with hyperglycemia: Secondary | ICD-10-CM | POA: Diagnosis not present

## 2017-09-02 DIAGNOSIS — I6389 Other cerebral infarction: Secondary | ICD-10-CM | POA: Diagnosis not present

## 2017-09-02 DIAGNOSIS — S72002A Fracture of unspecified part of neck of left femur, initial encounter for closed fracture: Secondary | ICD-10-CM | POA: Diagnosis not present

## 2017-09-02 DIAGNOSIS — I6359 Cerebral infarction due to unspecified occlusion or stenosis of other cerebral artery: Secondary | ICD-10-CM | POA: Diagnosis not present

## 2017-09-02 DIAGNOSIS — Z853 Personal history of malignant neoplasm of breast: Secondary | ICD-10-CM | POA: Diagnosis not present

## 2017-09-02 DIAGNOSIS — W1839XA Other fall on same level, initial encounter: Secondary | ICD-10-CM | POA: Diagnosis not present

## 2017-09-02 DIAGNOSIS — I10 Essential (primary) hypertension: Secondary | ICD-10-CM | POA: Diagnosis not present

## 2017-09-02 DIAGNOSIS — S72002D Fracture of unspecified part of neck of left femur, subsequent encounter for closed fracture with routine healing: Secondary | ICD-10-CM | POA: Diagnosis not present

## 2017-09-02 DIAGNOSIS — I69314 Frontal lobe and executive function deficit following cerebral infarction: Secondary | ICD-10-CM | POA: Diagnosis not present

## 2017-09-02 DIAGNOSIS — E1140 Type 2 diabetes mellitus with diabetic neuropathy, unspecified: Secondary | ICD-10-CM | POA: Diagnosis not present

## 2017-09-02 DIAGNOSIS — Z794 Long term (current) use of insulin: Secondary | ICD-10-CM | POA: Diagnosis not present

## 2017-09-02 DIAGNOSIS — T148XXA Other injury of unspecified body region, initial encounter: Secondary | ICD-10-CM | POA: Diagnosis not present

## 2017-09-02 DIAGNOSIS — Z749 Problem related to care provider dependency, unspecified: Secondary | ICD-10-CM | POA: Diagnosis not present

## 2017-09-02 DIAGNOSIS — M25552 Pain in left hip: Secondary | ICD-10-CM | POA: Diagnosis not present

## 2017-09-02 DIAGNOSIS — Z79899 Other long term (current) drug therapy: Secondary | ICD-10-CM | POA: Diagnosis not present

## 2017-09-02 DIAGNOSIS — I6523 Occlusion and stenosis of bilateral carotid arteries: Secondary | ICD-10-CM | POA: Diagnosis not present

## 2017-09-02 DIAGNOSIS — Y998 Other external cause status: Secondary | ICD-10-CM | POA: Diagnosis not present

## 2017-09-02 DIAGNOSIS — W109XXA Fall (on) (from) unspecified stairs and steps, initial encounter: Secondary | ICD-10-CM | POA: Diagnosis not present

## 2017-09-02 DIAGNOSIS — Z01810 Encounter for preprocedural cardiovascular examination: Secondary | ICD-10-CM | POA: Diagnosis not present

## 2017-09-02 DIAGNOSIS — R410 Disorientation, unspecified: Secondary | ICD-10-CM | POA: Diagnosis not present

## 2017-09-02 DIAGNOSIS — Z96642 Presence of left artificial hip joint: Secondary | ICD-10-CM | POA: Diagnosis not present

## 2017-09-02 DIAGNOSIS — S72012A Unspecified intracapsular fracture of left femur, initial encounter for closed fracture: Secondary | ICD-10-CM | POA: Diagnosis not present

## 2017-09-02 DIAGNOSIS — S72092A Other fracture of head and neck of left femur, initial encounter for closed fracture: Secondary | ICD-10-CM | POA: Diagnosis not present

## 2017-09-02 DIAGNOSIS — G464 Cerebellar stroke syndrome: Secondary | ICD-10-CM | POA: Diagnosis not present

## 2017-09-03 DIAGNOSIS — E119 Type 2 diabetes mellitus without complications: Secondary | ICD-10-CM | POA: Insufficient documentation

## 2017-09-03 DIAGNOSIS — I10 Essential (primary) hypertension: Secondary | ICD-10-CM | POA: Insufficient documentation

## 2017-09-03 DIAGNOSIS — N183 Chronic kidney disease, stage 3 unspecified: Secondary | ICD-10-CM | POA: Insufficient documentation

## 2017-09-04 DIAGNOSIS — G459 Transient cerebral ischemic attack, unspecified: Secondary | ICD-10-CM | POA: Insufficient documentation

## 2017-09-05 DIAGNOSIS — I6389 Other cerebral infarction: Secondary | ICD-10-CM | POA: Insufficient documentation

## 2017-09-08 DIAGNOSIS — S72002A Fracture of unspecified part of neck of left femur, initial encounter for closed fracture: Secondary | ICD-10-CM | POA: Diagnosis not present

## 2017-09-08 DIAGNOSIS — S72012D Unspecified intracapsular fracture of left femur, subsequent encounter for closed fracture with routine healing: Secondary | ICD-10-CM | POA: Diagnosis not present

## 2017-09-08 DIAGNOSIS — G8918 Other acute postprocedural pain: Secondary | ICD-10-CM | POA: Diagnosis not present

## 2017-09-08 DIAGNOSIS — M79605 Pain in left leg: Secondary | ICD-10-CM | POA: Diagnosis not present

## 2017-09-08 DIAGNOSIS — R262 Difficulty in walking, not elsewhere classified: Secondary | ICD-10-CM | POA: Diagnosis not present

## 2017-09-08 DIAGNOSIS — S728X9A Other fracture of unspecified femur, initial encounter for closed fracture: Secondary | ICD-10-CM | POA: Diagnosis not present

## 2017-09-08 DIAGNOSIS — S72002D Fracture of unspecified part of neck of left femur, subsequent encounter for closed fracture with routine healing: Secondary | ICD-10-CM | POA: Diagnosis not present

## 2017-09-08 DIAGNOSIS — I63412 Cerebral infarction due to embolism of left middle cerebral artery: Secondary | ICD-10-CM | POA: Diagnosis not present

## 2017-09-08 DIAGNOSIS — G8911 Acute pain due to trauma: Secondary | ICD-10-CM | POA: Diagnosis not present

## 2017-09-08 DIAGNOSIS — E1165 Type 2 diabetes mellitus with hyperglycemia: Secondary | ICD-10-CM | POA: Diagnosis not present

## 2017-09-08 DIAGNOSIS — G459 Transient cerebral ischemic attack, unspecified: Secondary | ICD-10-CM | POA: Diagnosis not present

## 2017-09-08 DIAGNOSIS — I639 Cerebral infarction, unspecified: Secondary | ICD-10-CM | POA: Diagnosis not present

## 2017-09-08 DIAGNOSIS — I6389 Other cerebral infarction: Secondary | ICD-10-CM | POA: Diagnosis not present

## 2017-09-08 DIAGNOSIS — E876 Hypokalemia: Secondary | ICD-10-CM | POA: Diagnosis not present

## 2017-09-08 DIAGNOSIS — Z749 Problem related to care provider dependency, unspecified: Secondary | ICD-10-CM | POA: Diagnosis not present

## 2017-09-08 DIAGNOSIS — E785 Hyperlipidemia, unspecified: Secondary | ICD-10-CM | POA: Diagnosis not present

## 2017-09-08 DIAGNOSIS — J9601 Acute respiratory failure with hypoxia: Secondary | ICD-10-CM | POA: Diagnosis not present

## 2017-09-08 DIAGNOSIS — N183 Chronic kidney disease, stage 3 (moderate): Secondary | ICD-10-CM | POA: Diagnosis not present

## 2017-09-08 DIAGNOSIS — G464 Cerebellar stroke syndrome: Secondary | ICD-10-CM | POA: Diagnosis not present

## 2017-09-08 DIAGNOSIS — I129 Hypertensive chronic kidney disease with stage 1 through stage 4 chronic kidney disease, or unspecified chronic kidney disease: Secondary | ICD-10-CM | POA: Diagnosis not present

## 2017-09-08 DIAGNOSIS — E1152 Type 2 diabetes mellitus with diabetic peripheral angiopathy with gangrene: Secondary | ICD-10-CM | POA: Diagnosis not present

## 2017-09-08 DIAGNOSIS — E1122 Type 2 diabetes mellitus with diabetic chronic kidney disease: Secondary | ICD-10-CM | POA: Diagnosis not present

## 2017-09-09 DIAGNOSIS — R262 Difficulty in walking, not elsewhere classified: Secondary | ICD-10-CM | POA: Diagnosis not present

## 2017-09-09 DIAGNOSIS — I639 Cerebral infarction, unspecified: Secondary | ICD-10-CM | POA: Diagnosis not present

## 2017-09-09 DIAGNOSIS — G8918 Other acute postprocedural pain: Secondary | ICD-10-CM | POA: Diagnosis not present

## 2017-09-09 DIAGNOSIS — E1152 Type 2 diabetes mellitus with diabetic peripheral angiopathy with gangrene: Secondary | ICD-10-CM | POA: Diagnosis not present

## 2017-09-12 ENCOUNTER — Other Ambulatory Visit: Payer: Self-pay

## 2017-09-12 NOTE — Patient Outreach (Signed)
[place] [facility]) Care Management  09/12/2017  [name] [birth year] [id number]   Transition of care  Referral date: 09/12/17 Referral source: Discharged from  [facility] on 09/08/17 Insurance: [employer] Attempt #1  Telephone call to patient regarding transition of care referral.  Unable to reach patient. HIPAA compliant voice message left with call back phone number.  PLAN; RNCM will attempt 2nd telephone call to patient within  3 business days.   Quinn Plowman RN,BSN,CCM [facility] Telephonic  [phone]

## 2017-09-13 ENCOUNTER — Other Ambulatory Visit: Payer: Self-pay

## 2017-09-13 NOTE — Patient Outreach (Signed)
[place] [facility]) Care Management  09/13/2017  [name] [birth year] [id number]   Transition of care  Referral date: 09/12/17 Referral source: Discharged from  [facility] on 09/08/17 Insurance: [employer] Attempt #2  Second telephone call to patient regarding transition of care referral.  Unable to reach patient. HIPAA compliant voice message left with call back phone number.  PLAN; RNCM will send outreach letter to patient to attempt contact.    Quinn Plowman RN,BSN,CCM [facility] Telephonic  [phone]

## 2017-09-25 DIAGNOSIS — S72012D Unspecified intracapsular fracture of left femur, subsequent encounter for closed fracture with routine healing: Secondary | ICD-10-CM | POA: Diagnosis not present

## 2017-09-25 DIAGNOSIS — Z8673 Personal history of transient ischemic attack (TIA), and cerebral infarction without residual deficits: Secondary | ICD-10-CM | POA: Diagnosis not present

## 2017-09-25 DIAGNOSIS — Z7982 Long term (current) use of aspirin: Secondary | ICD-10-CM | POA: Diagnosis not present

## 2017-09-25 DIAGNOSIS — M810 Age-related osteoporosis without current pathological fracture: Secondary | ICD-10-CM | POA: Diagnosis not present

## 2017-09-25 DIAGNOSIS — Z9181 History of falling: Secondary | ICD-10-CM | POA: Diagnosis not present

## 2017-09-25 DIAGNOSIS — I129 Hypertensive chronic kidney disease with stage 1 through stage 4 chronic kidney disease, or unspecified chronic kidney disease: Secondary | ICD-10-CM | POA: Diagnosis not present

## 2017-09-25 DIAGNOSIS — N183 Chronic kidney disease, stage 3 (moderate): Secondary | ICD-10-CM | POA: Diagnosis not present

## 2017-09-25 DIAGNOSIS — E1122 Type 2 diabetes mellitus with diabetic chronic kidney disease: Secondary | ICD-10-CM | POA: Diagnosis not present

## 2017-09-25 DIAGNOSIS — D649 Anemia, unspecified: Secondary | ICD-10-CM | POA: Diagnosis not present

## 2017-09-27 ENCOUNTER — Ambulatory Visit: Payer: Self-pay

## 2017-09-27 DIAGNOSIS — S72012D Unspecified intracapsular fracture of left femur, subsequent encounter for closed fracture with routine healing: Secondary | ICD-10-CM | POA: Diagnosis not present

## 2017-09-27 DIAGNOSIS — Z4789 Encounter for other orthopedic aftercare: Secondary | ICD-10-CM | POA: Diagnosis not present

## 2017-09-27 DIAGNOSIS — Z8673 Personal history of transient ischemic attack (TIA), and cerebral infarction without residual deficits: Secondary | ICD-10-CM | POA: Diagnosis not present

## 2017-09-27 DIAGNOSIS — E1122 Type 2 diabetes mellitus with diabetic chronic kidney disease: Secondary | ICD-10-CM | POA: Diagnosis not present

## 2017-09-27 DIAGNOSIS — M810 Age-related osteoporosis without current pathological fracture: Secondary | ICD-10-CM | POA: Diagnosis not present

## 2017-09-27 DIAGNOSIS — N183 Chronic kidney disease, stage 3 (moderate): Secondary | ICD-10-CM | POA: Diagnosis not present

## 2017-09-27 DIAGNOSIS — M1612 Unilateral primary osteoarthritis, left hip: Secondary | ICD-10-CM | POA: Diagnosis not present

## 2017-09-27 DIAGNOSIS — D649 Anemia, unspecified: Secondary | ICD-10-CM | POA: Diagnosis not present

## 2017-09-27 DIAGNOSIS — Z9181 History of falling: Secondary | ICD-10-CM | POA: Diagnosis not present

## 2017-09-27 DIAGNOSIS — Z7982 Long term (current) use of aspirin: Secondary | ICD-10-CM | POA: Diagnosis not present

## 2017-09-27 DIAGNOSIS — I129 Hypertensive chronic kidney disease with stage 1 through stage 4 chronic kidney disease, or unspecified chronic kidney disease: Secondary | ICD-10-CM | POA: Diagnosis not present

## 2017-09-29 DIAGNOSIS — Z79899 Other long term (current) drug therapy: Secondary | ICD-10-CM | POA: Diagnosis not present

## 2017-09-29 DIAGNOSIS — Z6826 Body mass index (BMI) 26.0-26.9, adult: Secondary | ICD-10-CM | POA: Diagnosis not present

## 2017-09-29 DIAGNOSIS — N183 Chronic kidney disease, stage 3 (moderate): Secondary | ICD-10-CM | POA: Diagnosis not present

## 2017-09-29 DIAGNOSIS — E782 Mixed hyperlipidemia: Secondary | ICD-10-CM | POA: Diagnosis not present

## 2017-09-29 DIAGNOSIS — Z1331 Encounter for screening for depression: Secondary | ICD-10-CM | POA: Diagnosis not present

## 2017-09-29 DIAGNOSIS — Z0000 Encounter for general adult medical examination without abnormal findings: Secondary | ICD-10-CM | POA: Diagnosis not present

## 2017-09-29 DIAGNOSIS — D519 Vitamin B12 deficiency anemia, unspecified: Secondary | ICD-10-CM | POA: Diagnosis not present

## 2017-09-29 DIAGNOSIS — I10 Essential (primary) hypertension: Secondary | ICD-10-CM | POA: Diagnosis not present

## 2017-09-29 DIAGNOSIS — E1165 Type 2 diabetes mellitus with hyperglycemia: Secondary | ICD-10-CM | POA: Diagnosis not present

## 2017-09-30 ENCOUNTER — Other Ambulatory Visit: Payer: Self-pay

## 2017-09-30 DIAGNOSIS — Z8673 Personal history of transient ischemic attack (TIA), and cerebral infarction without residual deficits: Secondary | ICD-10-CM | POA: Diagnosis not present

## 2017-09-30 DIAGNOSIS — M810 Age-related osteoporosis without current pathological fracture: Secondary | ICD-10-CM | POA: Diagnosis not present

## 2017-09-30 DIAGNOSIS — Z9181 History of falling: Secondary | ICD-10-CM | POA: Diagnosis not present

## 2017-09-30 DIAGNOSIS — Z7982 Long term (current) use of aspirin: Secondary | ICD-10-CM | POA: Diagnosis not present

## 2017-09-30 DIAGNOSIS — I129 Hypertensive chronic kidney disease with stage 1 through stage 4 chronic kidney disease, or unspecified chronic kidney disease: Secondary | ICD-10-CM | POA: Diagnosis not present

## 2017-09-30 DIAGNOSIS — D649 Anemia, unspecified: Secondary | ICD-10-CM | POA: Diagnosis not present

## 2017-09-30 DIAGNOSIS — E1122 Type 2 diabetes mellitus with diabetic chronic kidney disease: Secondary | ICD-10-CM | POA: Diagnosis not present

## 2017-09-30 DIAGNOSIS — S72012D Unspecified intracapsular fracture of left femur, subsequent encounter for closed fracture with routine healing: Secondary | ICD-10-CM | POA: Diagnosis not present

## 2017-09-30 DIAGNOSIS — N183 Chronic kidney disease, stage 3 (moderate): Secondary | ICD-10-CM | POA: Diagnosis not present

## 2017-09-30 NOTE — Patient Outreach (Signed)
[place] [facility]) Care Management  09/30/2017  [name] 07-04-1941 [id number]  Transition of care  Referral date:09/12/17 Referral source:Discharged from [facility] hospital on 09/08/17 Insurance:[employer]  Telephone call to patient regarding transition of care referral. Contact identified himself as patients spouse, [name].  Per patients chart Husband is patients designated party release/ HIPAA.  RNCM discussed transition of care follow up with spouse. Spouse states was recently in the hospital due to a left hip fracture/ surgery and mild stroke.  Spouse states patient is not having any residual at this time from the stroke.  Spouse states patient saw her primary MD on yesterday, 09/29/17.  States patients doctor discontinued her Metformin and increased her insulin  With the instruction of increase increase insulin by two units weekly until am blood sugar drops to 130.   States patient is taking her medications as prescribed.  Spouse states patient is receiving home health nursing and therapy care with [place].  Spouse states patient is having slow progress but is doing better than she was.  Spouse states patient had a follow up with her surgeon on 09/27/17.  Patient had dressing and stitches removed. Spouse states patient's incision is healing very well. RNCM reviewed signs/ symptoms of infection with patient.  Advised to call doctor for any changes in patients condition.  Spouse states patient has good family support.  States he drives her to her appointment and does the cooking.  Spouse states he does not feel patient needs any additional transition of care calls due to patient receiving home health at this time. Verbally agreed to receive [facility] brochure for future reference.  RNCM advised patient to notify MD of any changes in condition prior to scheduled appointment. RNCM provided contact name and number: [phone] or main office number [phone] and  24 hour nurse advise line [phone].  RNCM verified patient aware of 911 services for urgent/ emergent needs.   PLAN; RNCM will refer patient to care management assistant due to refusal of services.  RNCM will send notification to patients primary MD of closure.   Quinn Plowman RN,BSN,CCM [facility] Telephonic  [phone]

## 2017-10-03 DIAGNOSIS — Z8673 Personal history of transient ischemic attack (TIA), and cerebral infarction without residual deficits: Secondary | ICD-10-CM | POA: Diagnosis not present

## 2017-10-03 DIAGNOSIS — Z7982 Long term (current) use of aspirin: Secondary | ICD-10-CM | POA: Diagnosis not present

## 2017-10-03 DIAGNOSIS — I129 Hypertensive chronic kidney disease with stage 1 through stage 4 chronic kidney disease, or unspecified chronic kidney disease: Secondary | ICD-10-CM | POA: Diagnosis not present

## 2017-10-03 DIAGNOSIS — M810 Age-related osteoporosis without current pathological fracture: Secondary | ICD-10-CM | POA: Diagnosis not present

## 2017-10-03 DIAGNOSIS — Z9181 History of falling: Secondary | ICD-10-CM | POA: Diagnosis not present

## 2017-10-03 DIAGNOSIS — S72012D Unspecified intracapsular fracture of left femur, subsequent encounter for closed fracture with routine healing: Secondary | ICD-10-CM | POA: Diagnosis not present

## 2017-10-03 DIAGNOSIS — E1122 Type 2 diabetes mellitus with diabetic chronic kidney disease: Secondary | ICD-10-CM | POA: Diagnosis not present

## 2017-10-03 DIAGNOSIS — D649 Anemia, unspecified: Secondary | ICD-10-CM | POA: Diagnosis not present

## 2017-10-03 DIAGNOSIS — N183 Chronic kidney disease, stage 3 (moderate): Secondary | ICD-10-CM | POA: Diagnosis not present

## 2017-10-04 DIAGNOSIS — N183 Chronic kidney disease, stage 3 (moderate): Secondary | ICD-10-CM | POA: Diagnosis not present

## 2017-10-04 DIAGNOSIS — M810 Age-related osteoporosis without current pathological fracture: Secondary | ICD-10-CM | POA: Diagnosis not present

## 2017-10-04 DIAGNOSIS — D649 Anemia, unspecified: Secondary | ICD-10-CM | POA: Diagnosis not present

## 2017-10-04 DIAGNOSIS — S72012D Unspecified intracapsular fracture of left femur, subsequent encounter for closed fracture with routine healing: Secondary | ICD-10-CM | POA: Diagnosis not present

## 2017-10-04 DIAGNOSIS — I129 Hypertensive chronic kidney disease with stage 1 through stage 4 chronic kidney disease, or unspecified chronic kidney disease: Secondary | ICD-10-CM | POA: Diagnosis not present

## 2017-10-04 DIAGNOSIS — E1122 Type 2 diabetes mellitus with diabetic chronic kidney disease: Secondary | ICD-10-CM | POA: Diagnosis not present

## 2017-10-04 DIAGNOSIS — Z9181 History of falling: Secondary | ICD-10-CM | POA: Diagnosis not present

## 2017-10-04 DIAGNOSIS — Z7982 Long term (current) use of aspirin: Secondary | ICD-10-CM | POA: Diagnosis not present

## 2017-10-04 DIAGNOSIS — Z8673 Personal history of transient ischemic attack (TIA), and cerebral infarction without residual deficits: Secondary | ICD-10-CM | POA: Diagnosis not present

## 2017-10-05 DIAGNOSIS — S72012D Unspecified intracapsular fracture of left femur, subsequent encounter for closed fracture with routine healing: Secondary | ICD-10-CM | POA: Diagnosis not present

## 2017-10-05 DIAGNOSIS — D649 Anemia, unspecified: Secondary | ICD-10-CM | POA: Diagnosis not present

## 2017-10-05 DIAGNOSIS — N183 Chronic kidney disease, stage 3 (moderate): Secondary | ICD-10-CM | POA: Diagnosis not present

## 2017-10-05 DIAGNOSIS — Z7982 Long term (current) use of aspirin: Secondary | ICD-10-CM | POA: Diagnosis not present

## 2017-10-05 DIAGNOSIS — Z8673 Personal history of transient ischemic attack (TIA), and cerebral infarction without residual deficits: Secondary | ICD-10-CM | POA: Diagnosis not present

## 2017-10-05 DIAGNOSIS — E1122 Type 2 diabetes mellitus with diabetic chronic kidney disease: Secondary | ICD-10-CM | POA: Diagnosis not present

## 2017-10-05 DIAGNOSIS — I129 Hypertensive chronic kidney disease with stage 1 through stage 4 chronic kidney disease, or unspecified chronic kidney disease: Secondary | ICD-10-CM | POA: Diagnosis not present

## 2017-10-05 DIAGNOSIS — M810 Age-related osteoporosis without current pathological fracture: Secondary | ICD-10-CM | POA: Diagnosis not present

## 2017-10-05 DIAGNOSIS — Z9181 History of falling: Secondary | ICD-10-CM | POA: Diagnosis not present

## 2017-10-06 DIAGNOSIS — S72012D Unspecified intracapsular fracture of left femur, subsequent encounter for closed fracture with routine healing: Secondary | ICD-10-CM | POA: Diagnosis not present

## 2017-10-06 DIAGNOSIS — Z8673 Personal history of transient ischemic attack (TIA), and cerebral infarction without residual deficits: Secondary | ICD-10-CM | POA: Diagnosis not present

## 2017-10-06 DIAGNOSIS — N183 Chronic kidney disease, stage 3 (moderate): Secondary | ICD-10-CM | POA: Diagnosis not present

## 2017-10-06 DIAGNOSIS — D649 Anemia, unspecified: Secondary | ICD-10-CM | POA: Diagnosis not present

## 2017-10-06 DIAGNOSIS — Z7982 Long term (current) use of aspirin: Secondary | ICD-10-CM | POA: Diagnosis not present

## 2017-10-06 DIAGNOSIS — M810 Age-related osteoporosis without current pathological fracture: Secondary | ICD-10-CM | POA: Diagnosis not present

## 2017-10-06 DIAGNOSIS — Z9181 History of falling: Secondary | ICD-10-CM | POA: Diagnosis not present

## 2017-10-06 DIAGNOSIS — E1122 Type 2 diabetes mellitus with diabetic chronic kidney disease: Secondary | ICD-10-CM | POA: Diagnosis not present

## 2017-10-06 DIAGNOSIS — I129 Hypertensive chronic kidney disease with stage 1 through stage 4 chronic kidney disease, or unspecified chronic kidney disease: Secondary | ICD-10-CM | POA: Diagnosis not present

## 2017-10-10 DIAGNOSIS — N183 Chronic kidney disease, stage 3 (moderate): Secondary | ICD-10-CM | POA: Diagnosis not present

## 2017-10-10 DIAGNOSIS — Z9181 History of falling: Secondary | ICD-10-CM | POA: Diagnosis not present

## 2017-10-10 DIAGNOSIS — S72012D Unspecified intracapsular fracture of left femur, subsequent encounter for closed fracture with routine healing: Secondary | ICD-10-CM | POA: Diagnosis not present

## 2017-10-10 DIAGNOSIS — M810 Age-related osteoporosis without current pathological fracture: Secondary | ICD-10-CM | POA: Diagnosis not present

## 2017-10-10 DIAGNOSIS — I129 Hypertensive chronic kidney disease with stage 1 through stage 4 chronic kidney disease, or unspecified chronic kidney disease: Secondary | ICD-10-CM | POA: Diagnosis not present

## 2017-10-10 DIAGNOSIS — E1122 Type 2 diabetes mellitus with diabetic chronic kidney disease: Secondary | ICD-10-CM | POA: Diagnosis not present

## 2017-10-10 DIAGNOSIS — Z8673 Personal history of transient ischemic attack (TIA), and cerebral infarction without residual deficits: Secondary | ICD-10-CM | POA: Diagnosis not present

## 2017-10-10 DIAGNOSIS — Z7982 Long term (current) use of aspirin: Secondary | ICD-10-CM | POA: Diagnosis not present

## 2017-10-10 DIAGNOSIS — D649 Anemia, unspecified: Secondary | ICD-10-CM | POA: Diagnosis not present

## 2017-10-12 DIAGNOSIS — Z8673 Personal history of transient ischemic attack (TIA), and cerebral infarction without residual deficits: Secondary | ICD-10-CM | POA: Diagnosis not present

## 2017-10-12 DIAGNOSIS — N183 Chronic kidney disease, stage 3 (moderate): Secondary | ICD-10-CM | POA: Diagnosis not present

## 2017-10-12 DIAGNOSIS — M810 Age-related osteoporosis without current pathological fracture: Secondary | ICD-10-CM | POA: Diagnosis not present

## 2017-10-12 DIAGNOSIS — Z7982 Long term (current) use of aspirin: Secondary | ICD-10-CM | POA: Diagnosis not present

## 2017-10-12 DIAGNOSIS — D649 Anemia, unspecified: Secondary | ICD-10-CM | POA: Diagnosis not present

## 2017-10-12 DIAGNOSIS — E1122 Type 2 diabetes mellitus with diabetic chronic kidney disease: Secondary | ICD-10-CM | POA: Diagnosis not present

## 2017-10-12 DIAGNOSIS — Z9181 History of falling: Secondary | ICD-10-CM | POA: Diagnosis not present

## 2017-10-12 DIAGNOSIS — S72012D Unspecified intracapsular fracture of left femur, subsequent encounter for closed fracture with routine healing: Secondary | ICD-10-CM | POA: Diagnosis not present

## 2017-10-12 DIAGNOSIS — I129 Hypertensive chronic kidney disease with stage 1 through stage 4 chronic kidney disease, or unspecified chronic kidney disease: Secondary | ICD-10-CM | POA: Diagnosis not present

## 2017-10-18 DIAGNOSIS — S72012D Unspecified intracapsular fracture of left femur, subsequent encounter for closed fracture with routine healing: Secondary | ICD-10-CM | POA: Diagnosis not present

## 2017-10-18 DIAGNOSIS — Z8673 Personal history of transient ischemic attack (TIA), and cerebral infarction without residual deficits: Secondary | ICD-10-CM | POA: Diagnosis not present

## 2017-10-18 DIAGNOSIS — Z9181 History of falling: Secondary | ICD-10-CM | POA: Diagnosis not present

## 2017-10-18 DIAGNOSIS — E1122 Type 2 diabetes mellitus with diabetic chronic kidney disease: Secondary | ICD-10-CM | POA: Diagnosis not present

## 2017-10-18 DIAGNOSIS — Z7982 Long term (current) use of aspirin: Secondary | ICD-10-CM | POA: Diagnosis not present

## 2017-10-18 DIAGNOSIS — N183 Chronic kidney disease, stage 3 (moderate): Secondary | ICD-10-CM | POA: Diagnosis not present

## 2017-10-18 DIAGNOSIS — M810 Age-related osteoporosis without current pathological fracture: Secondary | ICD-10-CM | POA: Diagnosis not present

## 2017-10-18 DIAGNOSIS — I129 Hypertensive chronic kidney disease with stage 1 through stage 4 chronic kidney disease, or unspecified chronic kidney disease: Secondary | ICD-10-CM | POA: Diagnosis not present

## 2017-10-18 DIAGNOSIS — D649 Anemia, unspecified: Secondary | ICD-10-CM | POA: Diagnosis not present

## 2017-10-19 DIAGNOSIS — E1165 Type 2 diabetes mellitus with hyperglycemia: Secondary | ICD-10-CM | POA: Diagnosis not present

## 2017-10-19 DIAGNOSIS — D638 Anemia in other chronic diseases classified elsewhere: Secondary | ICD-10-CM | POA: Diagnosis not present

## 2017-10-19 DIAGNOSIS — I10 Essential (primary) hypertension: Secondary | ICD-10-CM | POA: Diagnosis not present

## 2017-10-19 DIAGNOSIS — Z6825 Body mass index (BMI) 25.0-25.9, adult: Secondary | ICD-10-CM | POA: Diagnosis not present

## 2017-10-21 DIAGNOSIS — N183 Chronic kidney disease, stage 3 (moderate): Secondary | ICD-10-CM | POA: Diagnosis not present

## 2017-10-21 DIAGNOSIS — Z9181 History of falling: Secondary | ICD-10-CM | POA: Diagnosis not present

## 2017-10-21 DIAGNOSIS — Z7982 Long term (current) use of aspirin: Secondary | ICD-10-CM | POA: Diagnosis not present

## 2017-10-21 DIAGNOSIS — E1122 Type 2 diabetes mellitus with diabetic chronic kidney disease: Secondary | ICD-10-CM | POA: Diagnosis not present

## 2017-10-21 DIAGNOSIS — I129 Hypertensive chronic kidney disease with stage 1 through stage 4 chronic kidney disease, or unspecified chronic kidney disease: Secondary | ICD-10-CM | POA: Diagnosis not present

## 2017-10-21 DIAGNOSIS — M810 Age-related osteoporosis without current pathological fracture: Secondary | ICD-10-CM | POA: Diagnosis not present

## 2017-10-21 DIAGNOSIS — Z8673 Personal history of transient ischemic attack (TIA), and cerebral infarction without residual deficits: Secondary | ICD-10-CM | POA: Diagnosis not present

## 2017-10-21 DIAGNOSIS — S72012D Unspecified intracapsular fracture of left femur, subsequent encounter for closed fracture with routine healing: Secondary | ICD-10-CM | POA: Diagnosis not present

## 2017-10-21 DIAGNOSIS — D649 Anemia, unspecified: Secondary | ICD-10-CM | POA: Diagnosis not present

## 2017-10-25 DIAGNOSIS — Z8673 Personal history of transient ischemic attack (TIA), and cerebral infarction without residual deficits: Secondary | ICD-10-CM | POA: Diagnosis not present

## 2017-10-25 DIAGNOSIS — D649 Anemia, unspecified: Secondary | ICD-10-CM | POA: Diagnosis not present

## 2017-10-25 DIAGNOSIS — N183 Chronic kidney disease, stage 3 (moderate): Secondary | ICD-10-CM | POA: Diagnosis not present

## 2017-10-25 DIAGNOSIS — Z7982 Long term (current) use of aspirin: Secondary | ICD-10-CM | POA: Diagnosis not present

## 2017-10-25 DIAGNOSIS — I129 Hypertensive chronic kidney disease with stage 1 through stage 4 chronic kidney disease, or unspecified chronic kidney disease: Secondary | ICD-10-CM | POA: Diagnosis not present

## 2017-10-25 DIAGNOSIS — M810 Age-related osteoporosis without current pathological fracture: Secondary | ICD-10-CM | POA: Diagnosis not present

## 2017-10-25 DIAGNOSIS — Z9181 History of falling: Secondary | ICD-10-CM | POA: Diagnosis not present

## 2017-10-25 DIAGNOSIS — E1122 Type 2 diabetes mellitus with diabetic chronic kidney disease: Secondary | ICD-10-CM | POA: Diagnosis not present

## 2017-10-25 DIAGNOSIS — S72012D Unspecified intracapsular fracture of left femur, subsequent encounter for closed fracture with routine healing: Secondary | ICD-10-CM | POA: Diagnosis not present

## 2017-10-27 DIAGNOSIS — N183 Chronic kidney disease, stage 3 (moderate): Secondary | ICD-10-CM | POA: Diagnosis not present

## 2017-10-27 DIAGNOSIS — Z7982 Long term (current) use of aspirin: Secondary | ICD-10-CM | POA: Diagnosis not present

## 2017-10-27 DIAGNOSIS — I129 Hypertensive chronic kidney disease with stage 1 through stage 4 chronic kidney disease, or unspecified chronic kidney disease: Secondary | ICD-10-CM | POA: Diagnosis not present

## 2017-10-27 DIAGNOSIS — Z9181 History of falling: Secondary | ICD-10-CM | POA: Diagnosis not present

## 2017-10-27 DIAGNOSIS — D649 Anemia, unspecified: Secondary | ICD-10-CM | POA: Diagnosis not present

## 2017-10-27 DIAGNOSIS — S72012D Unspecified intracapsular fracture of left femur, subsequent encounter for closed fracture with routine healing: Secondary | ICD-10-CM | POA: Diagnosis not present

## 2017-10-27 DIAGNOSIS — M810 Age-related osteoporosis without current pathological fracture: Secondary | ICD-10-CM | POA: Diagnosis not present

## 2017-10-27 DIAGNOSIS — Z8673 Personal history of transient ischemic attack (TIA), and cerebral infarction without residual deficits: Secondary | ICD-10-CM | POA: Diagnosis not present

## 2017-10-27 DIAGNOSIS — E1122 Type 2 diabetes mellitus with diabetic chronic kidney disease: Secondary | ICD-10-CM | POA: Diagnosis not present

## 2017-10-31 DIAGNOSIS — E1122 Type 2 diabetes mellitus with diabetic chronic kidney disease: Secondary | ICD-10-CM | POA: Diagnosis not present

## 2017-10-31 DIAGNOSIS — S72012D Unspecified intracapsular fracture of left femur, subsequent encounter for closed fracture with routine healing: Secondary | ICD-10-CM | POA: Diagnosis not present

## 2017-10-31 DIAGNOSIS — Z8673 Personal history of transient ischemic attack (TIA), and cerebral infarction without residual deficits: Secondary | ICD-10-CM | POA: Diagnosis not present

## 2017-10-31 DIAGNOSIS — Z9181 History of falling: Secondary | ICD-10-CM | POA: Diagnosis not present

## 2017-10-31 DIAGNOSIS — N183 Chronic kidney disease, stage 3 (moderate): Secondary | ICD-10-CM | POA: Diagnosis not present

## 2017-10-31 DIAGNOSIS — D649 Anemia, unspecified: Secondary | ICD-10-CM | POA: Diagnosis not present

## 2017-10-31 DIAGNOSIS — Z7982 Long term (current) use of aspirin: Secondary | ICD-10-CM | POA: Diagnosis not present

## 2017-10-31 DIAGNOSIS — I129 Hypertensive chronic kidney disease with stage 1 through stage 4 chronic kidney disease, or unspecified chronic kidney disease: Secondary | ICD-10-CM | POA: Diagnosis not present

## 2017-10-31 DIAGNOSIS — M810 Age-related osteoporosis without current pathological fracture: Secondary | ICD-10-CM | POA: Diagnosis not present

## 2017-11-02 DIAGNOSIS — S72012D Unspecified intracapsular fracture of left femur, subsequent encounter for closed fracture with routine healing: Secondary | ICD-10-CM | POA: Diagnosis not present

## 2017-11-02 DIAGNOSIS — M810 Age-related osteoporosis without current pathological fracture: Secondary | ICD-10-CM | POA: Diagnosis not present

## 2017-11-02 DIAGNOSIS — I129 Hypertensive chronic kidney disease with stage 1 through stage 4 chronic kidney disease, or unspecified chronic kidney disease: Secondary | ICD-10-CM | POA: Diagnosis not present

## 2017-11-02 DIAGNOSIS — Z9181 History of falling: Secondary | ICD-10-CM | POA: Diagnosis not present

## 2017-11-02 DIAGNOSIS — Z7982 Long term (current) use of aspirin: Secondary | ICD-10-CM | POA: Diagnosis not present

## 2017-11-02 DIAGNOSIS — Z8673 Personal history of transient ischemic attack (TIA), and cerebral infarction without residual deficits: Secondary | ICD-10-CM | POA: Diagnosis not present

## 2017-11-02 DIAGNOSIS — D649 Anemia, unspecified: Secondary | ICD-10-CM | POA: Diagnosis not present

## 2017-11-02 DIAGNOSIS — N183 Chronic kidney disease, stage 3 (moderate): Secondary | ICD-10-CM | POA: Diagnosis not present

## 2017-11-02 DIAGNOSIS — E1122 Type 2 diabetes mellitus with diabetic chronic kidney disease: Secondary | ICD-10-CM | POA: Diagnosis not present

## 2017-11-03 DIAGNOSIS — E1122 Type 2 diabetes mellitus with diabetic chronic kidney disease: Secondary | ICD-10-CM | POA: Diagnosis not present

## 2017-11-03 DIAGNOSIS — Z8673 Personal history of transient ischemic attack (TIA), and cerebral infarction without residual deficits: Secondary | ICD-10-CM | POA: Diagnosis not present

## 2017-11-03 DIAGNOSIS — D649 Anemia, unspecified: Secondary | ICD-10-CM | POA: Diagnosis not present

## 2017-11-03 DIAGNOSIS — S72012D Unspecified intracapsular fracture of left femur, subsequent encounter for closed fracture with routine healing: Secondary | ICD-10-CM | POA: Diagnosis not present

## 2017-11-03 DIAGNOSIS — N183 Chronic kidney disease, stage 3 (moderate): Secondary | ICD-10-CM | POA: Diagnosis not present

## 2017-11-03 DIAGNOSIS — Z7982 Long term (current) use of aspirin: Secondary | ICD-10-CM | POA: Diagnosis not present

## 2017-11-03 DIAGNOSIS — Z9181 History of falling: Secondary | ICD-10-CM | POA: Diagnosis not present

## 2017-11-03 DIAGNOSIS — M810 Age-related osteoporosis without current pathological fracture: Secondary | ICD-10-CM | POA: Diagnosis not present

## 2017-11-03 DIAGNOSIS — I129 Hypertensive chronic kidney disease with stage 1 through stage 4 chronic kidney disease, or unspecified chronic kidney disease: Secondary | ICD-10-CM | POA: Diagnosis not present

## 2017-11-07 DIAGNOSIS — Z7982 Long term (current) use of aspirin: Secondary | ICD-10-CM | POA: Diagnosis not present

## 2017-11-07 DIAGNOSIS — N183 Chronic kidney disease, stage 3 (moderate): Secondary | ICD-10-CM | POA: Diagnosis not present

## 2017-11-07 DIAGNOSIS — I129 Hypertensive chronic kidney disease with stage 1 through stage 4 chronic kidney disease, or unspecified chronic kidney disease: Secondary | ICD-10-CM | POA: Diagnosis not present

## 2017-11-07 DIAGNOSIS — D649 Anemia, unspecified: Secondary | ICD-10-CM | POA: Diagnosis not present

## 2017-11-07 DIAGNOSIS — S72012D Unspecified intracapsular fracture of left femur, subsequent encounter for closed fracture with routine healing: Secondary | ICD-10-CM | POA: Diagnosis not present

## 2017-11-07 DIAGNOSIS — M810 Age-related osteoporosis without current pathological fracture: Secondary | ICD-10-CM | POA: Diagnosis not present

## 2017-11-07 DIAGNOSIS — Z9181 History of falling: Secondary | ICD-10-CM | POA: Diagnosis not present

## 2017-11-07 DIAGNOSIS — Z8673 Personal history of transient ischemic attack (TIA), and cerebral infarction without residual deficits: Secondary | ICD-10-CM | POA: Diagnosis not present

## 2017-11-07 DIAGNOSIS — E1122 Type 2 diabetes mellitus with diabetic chronic kidney disease: Secondary | ICD-10-CM | POA: Diagnosis not present

## 2017-11-09 DIAGNOSIS — S72012D Unspecified intracapsular fracture of left femur, subsequent encounter for closed fracture with routine healing: Secondary | ICD-10-CM | POA: Diagnosis not present

## 2017-11-09 DIAGNOSIS — N183 Chronic kidney disease, stage 3 (moderate): Secondary | ICD-10-CM | POA: Diagnosis not present

## 2017-11-09 DIAGNOSIS — Z7982 Long term (current) use of aspirin: Secondary | ICD-10-CM | POA: Diagnosis not present

## 2017-11-09 DIAGNOSIS — I129 Hypertensive chronic kidney disease with stage 1 through stage 4 chronic kidney disease, or unspecified chronic kidney disease: Secondary | ICD-10-CM | POA: Diagnosis not present

## 2017-11-09 DIAGNOSIS — D649 Anemia, unspecified: Secondary | ICD-10-CM | POA: Diagnosis not present

## 2017-11-09 DIAGNOSIS — M810 Age-related osteoporosis without current pathological fracture: Secondary | ICD-10-CM | POA: Diagnosis not present

## 2017-11-09 DIAGNOSIS — Z8673 Personal history of transient ischemic attack (TIA), and cerebral infarction without residual deficits: Secondary | ICD-10-CM | POA: Diagnosis not present

## 2017-11-09 DIAGNOSIS — Z9181 History of falling: Secondary | ICD-10-CM | POA: Diagnosis not present

## 2017-11-09 DIAGNOSIS — E1122 Type 2 diabetes mellitus with diabetic chronic kidney disease: Secondary | ICD-10-CM | POA: Diagnosis not present

## 2017-11-14 DIAGNOSIS — D649 Anemia, unspecified: Secondary | ICD-10-CM | POA: Diagnosis not present

## 2017-11-14 DIAGNOSIS — S72012D Unspecified intracapsular fracture of left femur, subsequent encounter for closed fracture with routine healing: Secondary | ICD-10-CM | POA: Diagnosis not present

## 2017-11-14 DIAGNOSIS — N183 Chronic kidney disease, stage 3 (moderate): Secondary | ICD-10-CM | POA: Diagnosis not present

## 2017-11-14 DIAGNOSIS — Z9181 History of falling: Secondary | ICD-10-CM | POA: Diagnosis not present

## 2017-11-14 DIAGNOSIS — E1122 Type 2 diabetes mellitus with diabetic chronic kidney disease: Secondary | ICD-10-CM | POA: Diagnosis not present

## 2017-11-14 DIAGNOSIS — E1165 Type 2 diabetes mellitus with hyperglycemia: Secondary | ICD-10-CM | POA: Diagnosis not present

## 2017-11-14 DIAGNOSIS — M810 Age-related osteoporosis without current pathological fracture: Secondary | ICD-10-CM | POA: Diagnosis not present

## 2017-11-14 DIAGNOSIS — Z8673 Personal history of transient ischemic attack (TIA), and cerebral infarction without residual deficits: Secondary | ICD-10-CM | POA: Diagnosis not present

## 2017-11-14 DIAGNOSIS — I129 Hypertensive chronic kidney disease with stage 1 through stage 4 chronic kidney disease, or unspecified chronic kidney disease: Secondary | ICD-10-CM | POA: Diagnosis not present

## 2017-11-14 DIAGNOSIS — Z7982 Long term (current) use of aspirin: Secondary | ICD-10-CM | POA: Diagnosis not present

## 2017-11-16 DIAGNOSIS — Z9181 History of falling: Secondary | ICD-10-CM | POA: Diagnosis not present

## 2017-11-16 DIAGNOSIS — Z7982 Long term (current) use of aspirin: Secondary | ICD-10-CM | POA: Diagnosis not present

## 2017-11-16 DIAGNOSIS — D649 Anemia, unspecified: Secondary | ICD-10-CM | POA: Diagnosis not present

## 2017-11-16 DIAGNOSIS — M810 Age-related osteoporosis without current pathological fracture: Secondary | ICD-10-CM | POA: Diagnosis not present

## 2017-11-16 DIAGNOSIS — Z8673 Personal history of transient ischemic attack (TIA), and cerebral infarction without residual deficits: Secondary | ICD-10-CM | POA: Diagnosis not present

## 2017-11-16 DIAGNOSIS — I129 Hypertensive chronic kidney disease with stage 1 through stage 4 chronic kidney disease, or unspecified chronic kidney disease: Secondary | ICD-10-CM | POA: Diagnosis not present

## 2017-11-16 DIAGNOSIS — E1122 Type 2 diabetes mellitus with diabetic chronic kidney disease: Secondary | ICD-10-CM | POA: Diagnosis not present

## 2017-11-16 DIAGNOSIS — N183 Chronic kidney disease, stage 3 (moderate): Secondary | ICD-10-CM | POA: Diagnosis not present

## 2017-11-16 DIAGNOSIS — S72012D Unspecified intracapsular fracture of left femur, subsequent encounter for closed fracture with routine healing: Secondary | ICD-10-CM | POA: Diagnosis not present

## 2017-11-21 DIAGNOSIS — D649 Anemia, unspecified: Secondary | ICD-10-CM | POA: Diagnosis not present

## 2017-11-21 DIAGNOSIS — N183 Chronic kidney disease, stage 3 (moderate): Secondary | ICD-10-CM | POA: Diagnosis not present

## 2017-11-21 DIAGNOSIS — E1122 Type 2 diabetes mellitus with diabetic chronic kidney disease: Secondary | ICD-10-CM | POA: Diagnosis not present

## 2017-11-21 DIAGNOSIS — Z7982 Long term (current) use of aspirin: Secondary | ICD-10-CM | POA: Diagnosis not present

## 2017-11-21 DIAGNOSIS — M810 Age-related osteoporosis without current pathological fracture: Secondary | ICD-10-CM | POA: Diagnosis not present

## 2017-11-21 DIAGNOSIS — Z9181 History of falling: Secondary | ICD-10-CM | POA: Diagnosis not present

## 2017-11-21 DIAGNOSIS — S72012D Unspecified intracapsular fracture of left femur, subsequent encounter for closed fracture with routine healing: Secondary | ICD-10-CM | POA: Diagnosis not present

## 2017-11-21 DIAGNOSIS — Z8673 Personal history of transient ischemic attack (TIA), and cerebral infarction without residual deficits: Secondary | ICD-10-CM | POA: Diagnosis not present

## 2017-11-21 DIAGNOSIS — I129 Hypertensive chronic kidney disease with stage 1 through stage 4 chronic kidney disease, or unspecified chronic kidney disease: Secondary | ICD-10-CM | POA: Diagnosis not present

## 2017-11-29 DIAGNOSIS — R69 Illness, unspecified: Secondary | ICD-10-CM | POA: Diagnosis not present

## 2017-12-23 DIAGNOSIS — E559 Vitamin D deficiency, unspecified: Secondary | ICD-10-CM | POA: Diagnosis not present

## 2017-12-23 DIAGNOSIS — E1165 Type 2 diabetes mellitus with hyperglycemia: Secondary | ICD-10-CM | POA: Diagnosis not present

## 2017-12-26 ENCOUNTER — Other Ambulatory Visit: Payer: Self-pay | Admitting: Pharmacist

## 2017-12-26 NOTE — Patient Outreach (Signed)
[facility]) Care Management  12/26/2017  [name] Jan 06, 1941 [id number]   Incoming call from Thea Gist in response to the [facility] Medication Adherence Campaign. Speak with patient. HIPAA identifiers verified and verbal consent received.  [name] reports that she takes her blood pressure and diabetes medications everyday as directed. Denies barriers to adherence, including cost. Reports that she is not able to verify the medication names and doses with me now because she is away from home. Denies any medication questions or concerns at this time.  Will close pharmacy episode.  Harlow Asa, PharmD, [place] Management [phone]

## 2017-12-30 DIAGNOSIS — E782 Mixed hyperlipidemia: Secondary | ICD-10-CM | POA: Diagnosis not present

## 2017-12-30 DIAGNOSIS — E1169 Type 2 diabetes mellitus with other specified complication: Secondary | ICD-10-CM | POA: Diagnosis not present

## 2017-12-30 DIAGNOSIS — I10 Essential (primary) hypertension: Secondary | ICD-10-CM | POA: Diagnosis not present

## 2017-12-30 DIAGNOSIS — N183 Chronic kidney disease, stage 3 (moderate): Secondary | ICD-10-CM | POA: Diagnosis not present

## 2017-12-30 DIAGNOSIS — Z6826 Body mass index (BMI) 26.0-26.9, adult: Secondary | ICD-10-CM | POA: Diagnosis not present

## 2017-12-30 DIAGNOSIS — E113312 Type 2 diabetes mellitus with moderate nonproliferative diabetic retinopathy with macular edema, left eye: Secondary | ICD-10-CM | POA: Diagnosis not present

## 2018-01-03 DIAGNOSIS — Z853 Personal history of malignant neoplasm of breast: Secondary | ICD-10-CM | POA: Diagnosis not present

## 2018-01-03 DIAGNOSIS — R921 Mammographic calcification found on diagnostic imaging of breast: Secondary | ICD-10-CM | POA: Diagnosis not present

## 2018-02-07 DIAGNOSIS — E113411 Type 2 diabetes mellitus with severe nonproliferative diabetic retinopathy with macular edema, right eye: Secondary | ICD-10-CM | POA: Diagnosis not present

## 2018-02-07 DIAGNOSIS — E113512 Type 2 diabetes mellitus with proliferative diabetic retinopathy with macular edema, left eye: Secondary | ICD-10-CM | POA: Diagnosis not present

## 2018-02-07 DIAGNOSIS — H3582 Retinal ischemia: Secondary | ICD-10-CM | POA: Diagnosis not present

## 2018-03-10 DIAGNOSIS — E113512 Type 2 diabetes mellitus with proliferative diabetic retinopathy with macular edema, left eye: Secondary | ICD-10-CM | POA: Diagnosis not present

## 2018-03-10 DIAGNOSIS — E113411 Type 2 diabetes mellitus with severe nonproliferative diabetic retinopathy with macular edema, right eye: Secondary | ICD-10-CM | POA: Diagnosis not present

## 2018-03-29 DIAGNOSIS — E782 Mixed hyperlipidemia: Secondary | ICD-10-CM | POA: Diagnosis not present

## 2018-03-29 DIAGNOSIS — E1169 Type 2 diabetes mellitus with other specified complication: Secondary | ICD-10-CM | POA: Diagnosis not present

## 2018-04-04 DIAGNOSIS — N183 Chronic kidney disease, stage 3 (moderate): Secondary | ICD-10-CM | POA: Diagnosis not present

## 2018-04-04 DIAGNOSIS — Z6829 Body mass index (BMI) 29.0-29.9, adult: Secondary | ICD-10-CM | POA: Diagnosis not present

## 2018-04-04 DIAGNOSIS — E782 Mixed hyperlipidemia: Secondary | ICD-10-CM | POA: Diagnosis not present

## 2018-04-04 DIAGNOSIS — E113312 Type 2 diabetes mellitus with moderate nonproliferative diabetic retinopathy with macular edema, left eye: Secondary | ICD-10-CM | POA: Diagnosis not present

## 2018-04-04 DIAGNOSIS — I10 Essential (primary) hypertension: Secondary | ICD-10-CM | POA: Diagnosis not present

## 2018-04-07 DIAGNOSIS — E113513 Type 2 diabetes mellitus with proliferative diabetic retinopathy with macular edema, bilateral: Secondary | ICD-10-CM | POA: Diagnosis not present

## 2018-04-07 DIAGNOSIS — H3582 Retinal ischemia: Secondary | ICD-10-CM | POA: Diagnosis not present

## 2018-04-14 ENCOUNTER — Inpatient Hospital Stay: Payer: Medicare HMO | Attending: Hematology and Oncology | Admitting: Hematology and Oncology

## 2018-04-14 ENCOUNTER — Telehealth: Payer: Self-pay | Admitting: Hematology and Oncology

## 2018-04-14 DIAGNOSIS — Z923 Personal history of irradiation: Secondary | ICD-10-CM

## 2018-04-14 DIAGNOSIS — Z170 Estrogen receptor positive status [ER+]: Secondary | ICD-10-CM | POA: Diagnosis not present

## 2018-04-14 DIAGNOSIS — Z79811 Long term (current) use of aromatase inhibitors: Secondary | ICD-10-CM | POA: Insufficient documentation

## 2018-04-14 DIAGNOSIS — Z79899 Other long term (current) drug therapy: Secondary | ICD-10-CM | POA: Diagnosis not present

## 2018-04-14 DIAGNOSIS — C50411 Malignant neoplasm of upper-outer quadrant of right female breast: Secondary | ICD-10-CM | POA: Diagnosis not present

## 2018-04-14 DIAGNOSIS — M8580 Other specified disorders of bone density and structure, unspecified site: Secondary | ICD-10-CM | POA: Insufficient documentation

## 2018-04-14 DIAGNOSIS — C50911 Malignant neoplasm of unspecified site of right female breast: Secondary | ICD-10-CM

## 2018-04-14 DIAGNOSIS — N951 Menopausal and female climacteric states: Secondary | ICD-10-CM | POA: Diagnosis not present

## 2018-04-14 MED ORDER — LETROZOLE 2.5 MG PO TABS: 2.5000 mg | ORAL_TABLET | Freq: Every day | ORAL | 3 refills | Status: DC

## 2018-04-14 NOTE — Telephone Encounter (Signed)
Gave pt avs and calendar  °

## 2018-04-14 NOTE — Progress Notes (Signed)
 Patient Care Team: Khan, Jaber A, MD as PCP - General (Family Medicine)  DIAGNOSIS:  Encounter Diagnosis  Name Primary?  . Lobular carcinoma of right breast ([facility])     SUMMARY OF ONCOLOGIC HISTORY:   Lobular carcinoma of right breast ([facility])   08/03/2010 Mammogram    3.4 cm area of density involving the skin and the axillary crease on the right breast.  An ultrasound was performed showing a 2 cm mass     08/19/2010 Initial Diagnosis    invasive mammary carcinoma with lobular features, ER 95%, PR 94%, Ki67 6%, HER2 negative at 0.85    08/25/2010 Breast MRI    8 x 4.1 x 4.0 cm mass in the upper outer quadrant of the right breast extending to the skin and with associated skin dimpling    08/26/2010 - 01/24/2011 Anti-estrogen oral therapy    Letrozole 2.5 mg daily for neoadjuvant therapy    01/24/2011 Breast MRI    complete resolution of abnormal enhancement in the right axillary tail compatible with tumor treatment response    03/26/2011 Surgery    R Lumpectomy; Inv lobular cancer 4.5 cm with tumor at anterior margin, 3/4 LN positive; Reexcision no additional tumor. Oncotype 18. DId not need chemotherapy     - 07/27/2011 Radiation Therapy    Radiation in [place]    09/02/2011 -  Anti-estrogen oral therapy    Letrozole 2.5 mg daily     CHIEF COMPLIANT: Follow-up on letrozole therapy  INTERVAL HISTORY: [name] is a 77-year-old with above-mentioned history of right breast cancer treated with neoadjuvant letrozole followed by lumpectomy and radiation is currently on letrozole since the past 5 years.  She has been tolerating letrozole fairly well.  Does not have any hot flashes or myalgias.  Unfortunately she had a fracture of her hip and had a remarkable journey with very quick recovery from that. She denies any lumps or nodules in the breast.  REVIEW OF SYSTEMS:   Constitutional: Denies fevers, chills or abnormal weight loss Eyes: Denies blurriness of vision Ears, nose, mouth,  throat, and face: Denies mucositis or sore throat Respiratory: Denies cough, dyspnea or wheezes Cardiovascular: Denies palpitation, chest discomfort Gastrointestinal:  Denies nausea, heartburn or change in bowel habits Skin: Denies abnormal skin rashes Lymphatics: Denies new lymphadenopathy or easy bruising Neurological:Denies numbness, tingling or new weaknesses Behavioral/Psych: Mood is stable, no new changes  Extremities: No lower extremity edema Breast:  denies any pain or lumps or nodules in either breasts All other systems were reviewed with the patient and are negative.  I have reviewed the past medical history, past surgical history, social history and family history with the patient and they are unchanged from previous note.  ALLERGIES:  has No Known Allergies.  MEDICATIONS:  Current Outpatient Medications  Medication Sig Dispense Refill  . amLODipine (NORVASC) 5 MG tablet Take 5 mg by mouth daily.    . aspirin 325 MG tablet Take 325 mg by mouth daily.    . atorvastatin (LIPITOR) 40 MG tablet Take 40 mg by mouth daily. Take 1/2 pill daily    . B Complex-C (SUPER B COMPLEX PO) Take 1,000 mg by mouth daily.     . Calcium Carbonate (CALTRATE 600 PO) Take 1,200 mg by mouth 2 (two) times daily.     . Cholecalciferol (VITAMIN D) 2000 UNITS tablet Take 4,000 Units by mouth daily.    . fluticasone (FLONASE) 50 MCG/ACT nasal spray as needed.    . furosemide (  LASIX) 40 MG tablet Take 40 mg by mouth as needed.     . Insulin Degludec-Liraglutide (XULTOPHY) 100-3.6 UNIT-MG/ML SOPN Inject 32 Units into the skin. Daily per spouse    . Insulin Glargine (TOUJEO SOLOSTAR [place]) Inject 50 Units into the skin.    Marland Kitchen letrozole (FEMARA) 2.5 MG tablet Take 1 tablet (2.5 mg total) by mouth daily. 90 tablet 3  . LEVEMIR FLEXTOUCH 100 UNIT/ML Pen 75/25    . metoprolol succinate (TOPROL-XL) 50 MG 24 hr tablet 50 mg daily.     Marland Kitchen olmesartan (BENICAR) 40 MG tablet Take 40 mg by mouth daily.     No current  facility-administered medications for this visit.     PHYSICAL EXAMINATION: ECOG PERFORMANCE STATUS: 1 - Symptomatic but completely ambulatory  Vitals:   04/14/18 0935  BP: (!) 166/79  Pulse: 78  Resp: 17  Temp: 97.8 F (36.6 C)  SpO2: 99%   Filed Weights   04/14/18 0935  Weight: 174 lb 9.6 oz (79.2 kg)    GENERAL:alert, no distress and comfortable SKIN: skin color, texture, turgor are normal, no rashes or significant lesions EYES: normal, Conjunctiva are pink and non-injected, sclera clear OROPHARYNX:no exudate, no erythema and lips, buccal mucosa, and tongue normal  NECK: supple, thyroid normal size, non-tender, without nodularity LYMPH:  no palpable lymphadenopathy in the cervical, axillary or inguinal LUNGS: clear to auscultation and percussion with normal breathing effort HEART: regular rate & rhythm and no murmurs and no lower extremity edema ABDOMEN:abdomen soft, non-tender and normal bowel sounds MUSCULOSKELETAL:no cyanosis of digits and no clubbing  NEURO: alert & oriented x 3 with fluent speech, no focal motor/sensory deficits EXTREMITIES: No lower extremity edema BREAST: No palpable masses or nodules in either right or left breasts. No palpable axillary supraclavicular or infraclavicular adenopathy no breast tenderness or nipple discharge. (exam performed in the presence of a chaperone)  LABORATORY DATA:  I have reviewed the data as listed CMP Latest Ref Rng & Units 04/14/2015 08/31/2011 06/01/2011  Glucose 70 - 140 mg/dl 499(H) 476(H) 372(H)  BUN 7.0 - 26.0 mg/dL 13.0 22 20  Creatinine 0.6 - 1.1 mg/dL 1.3(H) 1.23(H) 1.26(H)  Sodium 136 - 145 mEq/L 138 135 141  Potassium 3.5 - 5.1 mEq/L 4.0 3.7 4.3  Chloride 96 - 112 mEq/L - 97 102  CO2 22 - 29 mEq/L _0 Calcium 8.4 - 10.4 mg/dL 10.3 9.7 9.8  Total Protein 6.4 - 8.3 g/dL 7.1 6.8 6.9  Total Bilirubin 0.20 - 1.20 mg/dL 0.79 0.6 0.4  Alkaline Phos 40 - 150 U/L 139 106 90  AST 5 - 34 U/L _1 ALT 0 - 55  U/L _2 Lab Results  Component Value Date   WBC 5.9 04/14/2015   HGB 14.5 04/14/2015   HCT 42.4 04/14/2015   MCV 88.7 04/14/2015   PLT 197 04/14/2015   NEUTROABS 4.0 04/14/2015    ASSESSMENT & PLAN:  Lobular carcinoma of right breast Right breast invasive lobular carcinoma status post neoadjuvant hormonal therapy followed by lumpectomy and radiation therapy. Patient had extensive lymph node involvement 7/8 positive lymph nodes. At that time she Oncotype DX that revealed low risk of recurrence and she did not get chemotherapy.  Current treatment: Letrozole 2.5 mg daily since 2012 Because of her lobular breast cancer and because she had 7 positive lymph nodes and recommended extended adjuvant therapy beyond 5 years. Patient is interested in taking it for 10 years.  Letrozole toxicities: 1. Occasional hot flashes.  2. Osteopenia : Takes calcium with vitamin D being monitored with bone density  Breast cancer surveillance: 1. Breast exam 04/14/2018 : No palpable lumps or nodules. Specifically changed he noticed of the cross the most is a pleasant Caucasian 2. Mamm[facility]06/01/2017 at [facility]: Benign, breast density category B  3. Bone density 12/30/2016: T score -1.4 osteopenia   return to clinic in One year for follow-up    No orders of the defined types were placed in this encounter.  The patient has a good understanding of the overall plan. she agrees with it. she will call with any problems that may develop before the next visit here.   Harriette Ohara, MD 04/14/18

## 2018-04-14 NOTE — Assessment & Plan Note (Signed)
Right breast invasive lobular carcinoma status post neoadjuvant hormonal therapy followed by lumpectomy and radiation therapy. Patient had extensive lymph node involvement 7/8 positive lymph nodes. At that time she Oncotype DX that revealed low risk of recurrence and she did not get chemotherapy.  Current treatment: Letrozole 2.5 mg daily since 2012 Because of her lobular breast cancer and because she had 7 positive lymph nodes and recommended extended adjuvant therapy beyond 5 years.  Letrozole toxicities: 1. Occasional hot flashes.  2. Osteopenia : Takes calcium with vitamin D being monitored with bone density  Breast cancer surveillance: 1. Breast exam 04/14/2018 : No palpable lumps or nodules. Specifically changed he noticed of the cross the most is a pleasant Caucasian 2. Mammogram06/01/2017 at [facility]: Benign, breast density category B  3. Bone density 12/30/2016: T score -1.4 osteopenia   return to clinic in One year for follow-up

## 2018-05-05 DIAGNOSIS — E113513 Type 2 diabetes mellitus with proliferative diabetic retinopathy with macular edema, bilateral: Secondary | ICD-10-CM | POA: Diagnosis not present

## 2018-05-05 DIAGNOSIS — H3582 Retinal ischemia: Secondary | ICD-10-CM | POA: Diagnosis not present

## 2018-06-02 DIAGNOSIS — H3582 Retinal ischemia: Secondary | ICD-10-CM | POA: Diagnosis not present

## 2018-06-02 DIAGNOSIS — E113513 Type 2 diabetes mellitus with proliferative diabetic retinopathy with macular edema, bilateral: Secondary | ICD-10-CM | POA: Diagnosis not present

## 2018-06-29 DIAGNOSIS — Z79899 Other long term (current) drug therapy: Secondary | ICD-10-CM | POA: Diagnosis not present

## 2018-06-29 DIAGNOSIS — E1169 Type 2 diabetes mellitus with other specified complication: Secondary | ICD-10-CM | POA: Diagnosis not present

## 2018-06-29 DIAGNOSIS — E782 Mixed hyperlipidemia: Secondary | ICD-10-CM | POA: Diagnosis not present

## 2018-06-30 DIAGNOSIS — H3582 Retinal ischemia: Secondary | ICD-10-CM | POA: Diagnosis not present

## 2018-06-30 DIAGNOSIS — E113513 Type 2 diabetes mellitus with proliferative diabetic retinopathy with macular edema, bilateral: Secondary | ICD-10-CM | POA: Diagnosis not present

## 2018-07-04 DIAGNOSIS — I129 Hypertensive chronic kidney disease with stage 1 through stage 4 chronic kidney disease, or unspecified chronic kidney disease: Secondary | ICD-10-CM | POA: Diagnosis not present

## 2018-07-04 DIAGNOSIS — E1169 Type 2 diabetes mellitus with other specified complication: Secondary | ICD-10-CM | POA: Diagnosis not present

## 2018-07-04 DIAGNOSIS — E113312 Type 2 diabetes mellitus with moderate nonproliferative diabetic retinopathy with macular edema, left eye: Secondary | ICD-10-CM | POA: Diagnosis not present

## 2018-07-04 DIAGNOSIS — I10 Essential (primary) hypertension: Secondary | ICD-10-CM | POA: Diagnosis not present

## 2018-07-04 DIAGNOSIS — E782 Mixed hyperlipidemia: Secondary | ICD-10-CM | POA: Diagnosis not present

## 2018-07-04 DIAGNOSIS — Z6831 Body mass index (BMI) 31.0-31.9, adult: Secondary | ICD-10-CM | POA: Diagnosis not present

## 2018-07-04 DIAGNOSIS — Z2821 Immunization not carried out because of patient refusal: Secondary | ICD-10-CM | POA: Diagnosis not present

## 2018-07-04 DIAGNOSIS — Z23 Encounter for immunization: Secondary | ICD-10-CM | POA: Diagnosis not present

## 2018-07-06 ENCOUNTER — Encounter: Payer: Self-pay | Admitting: Hematology and Oncology

## 2018-07-06 DIAGNOSIS — Z743 Need for continuous supervision: Secondary | ICD-10-CM | POA: Diagnosis not present

## 2018-07-06 DIAGNOSIS — Z794 Long term (current) use of insulin: Secondary | ICD-10-CM | POA: Diagnosis not present

## 2018-07-06 DIAGNOSIS — W19XXXA Unspecified fall, initial encounter: Secondary | ICD-10-CM | POA: Insufficient documentation

## 2018-07-06 DIAGNOSIS — M25551 Pain in right hip: Secondary | ICD-10-CM | POA: Diagnosis not present

## 2018-07-06 DIAGNOSIS — N183 Chronic kidney disease, stage 3 (moderate): Secondary | ICD-10-CM | POA: Diagnosis not present

## 2018-07-06 DIAGNOSIS — R197 Diarrhea, unspecified: Secondary | ICD-10-CM | POA: Diagnosis not present

## 2018-07-06 DIAGNOSIS — S72001A Fracture of unspecified part of neck of right femur, initial encounter for closed fracture: Secondary | ICD-10-CM | POA: Diagnosis not present

## 2018-07-06 DIAGNOSIS — I129 Hypertensive chronic kidney disease with stage 1 through stage 4 chronic kidney disease, or unspecified chronic kidney disease: Secondary | ICD-10-CM | POA: Diagnosis not present

## 2018-07-06 DIAGNOSIS — I10 Essential (primary) hypertension: Secondary | ICD-10-CM | POA: Diagnosis not present

## 2018-07-06 DIAGNOSIS — R921 Mammographic calcification found on diagnostic imaging of breast: Secondary | ICD-10-CM | POA: Diagnosis not present

## 2018-07-06 DIAGNOSIS — S72141A Displaced intertrochanteric fracture of right femur, initial encounter for closed fracture: Secondary | ICD-10-CM | POA: Diagnosis not present

## 2018-07-06 DIAGNOSIS — R112 Nausea with vomiting, unspecified: Secondary | ICD-10-CM | POA: Diagnosis not present

## 2018-07-06 DIAGNOSIS — E1122 Type 2 diabetes mellitus with diabetic chronic kidney disease: Secondary | ICD-10-CM | POA: Diagnosis not present

## 2018-07-06 DIAGNOSIS — R9431 Abnormal electrocardiogram [ECG] [EKG]: Secondary | ICD-10-CM | POA: Diagnosis not present

## 2018-07-06 DIAGNOSIS — E876 Hypokalemia: Secondary | ICD-10-CM | POA: Diagnosis not present

## 2018-07-06 DIAGNOSIS — M25572 Pain in left ankle and joints of left foot: Secondary | ICD-10-CM | POA: Diagnosis not present

## 2018-07-06 DIAGNOSIS — W01198A Fall on same level from slipping, tripping and stumbling with subsequent striking against other object, initial encounter: Secondary | ICD-10-CM | POA: Diagnosis not present

## 2018-07-06 DIAGNOSIS — D62 Acute posthemorrhagic anemia: Secondary | ICD-10-CM | POA: Diagnosis not present

## 2018-07-06 DIAGNOSIS — R52 Pain, unspecified: Secondary | ICD-10-CM | POA: Diagnosis not present

## 2018-07-06 DIAGNOSIS — I69314 Frontal lobe and executive function deficit following cerebral infarction: Secondary | ICD-10-CM | POA: Diagnosis not present

## 2018-07-06 DIAGNOSIS — S72041A Displaced fracture of base of neck of right femur, initial encounter for closed fracture: Secondary | ICD-10-CM | POA: Diagnosis not present

## 2018-07-06 DIAGNOSIS — S72002A Fracture of unspecified part of neck of left femur, initial encounter for closed fracture: Secondary | ICD-10-CM | POA: Diagnosis not present

## 2018-07-06 DIAGNOSIS — D72829 Elevated white blood cell count, unspecified: Secondary | ICD-10-CM | POA: Diagnosis not present

## 2018-07-06 DIAGNOSIS — S72001D Fracture of unspecified part of neck of right femur, subsequent encounter for closed fracture with routine healing: Secondary | ICD-10-CM | POA: Diagnosis not present

## 2018-07-06 DIAGNOSIS — Z8673 Personal history of transient ischemic attack (TIA), and cerebral infarction without residual deficits: Secondary | ICD-10-CM | POA: Diagnosis not present

## 2018-07-06 DIAGNOSIS — Y998 Other external cause status: Secondary | ICD-10-CM | POA: Diagnosis not present

## 2018-07-06 DIAGNOSIS — K219 Gastro-esophageal reflux disease without esophagitis: Secondary | ICD-10-CM | POA: Diagnosis not present

## 2018-07-06 DIAGNOSIS — R609 Edema, unspecified: Secondary | ICD-10-CM | POA: Diagnosis not present

## 2018-07-06 DIAGNOSIS — Y92009 Unspecified place in unspecified non-institutional (private) residence as the place of occurrence of the external cause: Secondary | ICD-10-CM | POA: Diagnosis not present

## 2018-07-06 DIAGNOSIS — E119 Type 2 diabetes mellitus without complications: Secondary | ICD-10-CM | POA: Diagnosis not present

## 2018-07-06 DIAGNOSIS — R269 Unspecified abnormalities of gait and mobility: Secondary | ICD-10-CM | POA: Diagnosis not present

## 2018-07-06 DIAGNOSIS — R279 Unspecified lack of coordination: Secondary | ICD-10-CM | POA: Diagnosis not present

## 2018-07-06 DIAGNOSIS — R0902 Hypoxemia: Secondary | ICD-10-CM | POA: Diagnosis not present

## 2018-07-06 DIAGNOSIS — E785 Hyperlipidemia, unspecified: Secondary | ICD-10-CM | POA: Diagnosis not present

## 2018-07-11 DIAGNOSIS — Z794 Long term (current) use of insulin: Secondary | ICD-10-CM | POA: Diagnosis not present

## 2018-07-11 DIAGNOSIS — E785 Hyperlipidemia, unspecified: Secondary | ICD-10-CM | POA: Diagnosis not present

## 2018-07-11 DIAGNOSIS — I10 Essential (primary) hypertension: Secondary | ICD-10-CM | POA: Diagnosis not present

## 2018-07-11 DIAGNOSIS — D72829 Elevated white blood cell count, unspecified: Secondary | ICD-10-CM | POA: Diagnosis not present

## 2018-07-11 DIAGNOSIS — S72001A Fracture of unspecified part of neck of right femur, initial encounter for closed fracture: Secondary | ICD-10-CM | POA: Diagnosis not present

## 2018-07-11 DIAGNOSIS — G8918 Other acute postprocedural pain: Secondary | ICD-10-CM | POA: Diagnosis not present

## 2018-07-11 DIAGNOSIS — R262 Difficulty in walking, not elsewhere classified: Secondary | ICD-10-CM | POA: Diagnosis not present

## 2018-07-11 DIAGNOSIS — R112 Nausea with vomiting, unspecified: Secondary | ICD-10-CM | POA: Diagnosis not present

## 2018-07-11 DIAGNOSIS — I69314 Frontal lobe and executive function deficit following cerebral infarction: Secondary | ICD-10-CM | POA: Diagnosis not present

## 2018-07-11 DIAGNOSIS — N183 Chronic kidney disease, stage 3 (moderate): Secondary | ICD-10-CM | POA: Diagnosis not present

## 2018-07-11 DIAGNOSIS — M25551 Pain in right hip: Secondary | ICD-10-CM | POA: Diagnosis not present

## 2018-07-11 DIAGNOSIS — E1122 Type 2 diabetes mellitus with diabetic chronic kidney disease: Secondary | ICD-10-CM | POA: Diagnosis not present

## 2018-07-11 DIAGNOSIS — S72001D Fracture of unspecified part of neck of right femur, subsequent encounter for closed fracture with routine healing: Secondary | ICD-10-CM | POA: Diagnosis not present

## 2018-07-11 DIAGNOSIS — R279 Unspecified lack of coordination: Secondary | ICD-10-CM | POA: Diagnosis not present

## 2018-07-11 DIAGNOSIS — L988 Other specified disorders of the skin and subcutaneous tissue: Secondary | ICD-10-CM | POA: Diagnosis not present

## 2018-07-11 DIAGNOSIS — I129 Hypertensive chronic kidney disease with stage 1 through stage 4 chronic kidney disease, or unspecified chronic kidney disease: Secondary | ICD-10-CM | POA: Diagnosis not present

## 2018-07-11 DIAGNOSIS — K219 Gastro-esophageal reflux disease without esophagitis: Secondary | ICD-10-CM | POA: Diagnosis not present

## 2018-07-11 DIAGNOSIS — E876 Hypokalemia: Secondary | ICD-10-CM | POA: Diagnosis not present

## 2018-07-11 DIAGNOSIS — E119 Type 2 diabetes mellitus without complications: Secondary | ICD-10-CM | POA: Diagnosis not present

## 2018-07-11 DIAGNOSIS — M19041 Primary osteoarthritis, right hand: Secondary | ICD-10-CM | POA: Diagnosis not present

## 2018-07-11 DIAGNOSIS — D62 Acute posthemorrhagic anemia: Secondary | ICD-10-CM | POA: Diagnosis not present

## 2018-07-11 DIAGNOSIS — L89312 Pressure ulcer of right buttock, stage 2: Secondary | ICD-10-CM | POA: Diagnosis not present

## 2018-07-11 DIAGNOSIS — R197 Diarrhea, unspecified: Secondary | ICD-10-CM | POA: Diagnosis not present

## 2018-07-11 DIAGNOSIS — D649 Anemia, unspecified: Secondary | ICD-10-CM | POA: Diagnosis not present

## 2018-07-11 DIAGNOSIS — Z743 Need for continuous supervision: Secondary | ICD-10-CM | POA: Diagnosis not present

## 2018-07-21 DIAGNOSIS — M25551 Pain in right hip: Secondary | ICD-10-CM | POA: Diagnosis not present

## 2018-07-21 DIAGNOSIS — M19041 Primary osteoarthritis, right hand: Secondary | ICD-10-CM | POA: Diagnosis not present

## 2018-07-25 DIAGNOSIS — L89312 Pressure ulcer of right buttock, stage 2: Secondary | ICD-10-CM | POA: Diagnosis not present

## 2018-08-01 DIAGNOSIS — L988 Other specified disorders of the skin and subcutaneous tissue: Secondary | ICD-10-CM | POA: Diagnosis not present

## 2018-08-02 DIAGNOSIS — G8918 Other acute postprocedural pain: Secondary | ICD-10-CM | POA: Diagnosis not present

## 2018-08-02 DIAGNOSIS — S72001D Fracture of unspecified part of neck of right femur, subsequent encounter for closed fracture with routine healing: Secondary | ICD-10-CM | POA: Diagnosis not present

## 2018-08-02 DIAGNOSIS — D649 Anemia, unspecified: Secondary | ICD-10-CM | POA: Diagnosis not present

## 2018-08-02 DIAGNOSIS — R262 Difficulty in walking, not elsewhere classified: Secondary | ICD-10-CM | POA: Diagnosis not present

## 2018-08-05 DIAGNOSIS — S72001D Fracture of unspecified part of neck of right femur, subsequent encounter for closed fracture with routine healing: Secondary | ICD-10-CM | POA: Diagnosis not present

## 2018-08-05 DIAGNOSIS — Z7982 Long term (current) use of aspirin: Secondary | ICD-10-CM | POA: Diagnosis not present

## 2018-08-05 DIAGNOSIS — E1122 Type 2 diabetes mellitus with diabetic chronic kidney disease: Secondary | ICD-10-CM | POA: Diagnosis not present

## 2018-08-05 DIAGNOSIS — I129 Hypertensive chronic kidney disease with stage 1 through stage 4 chronic kidney disease, or unspecified chronic kidney disease: Secondary | ICD-10-CM | POA: Diagnosis not present

## 2018-08-05 DIAGNOSIS — Z9181 History of falling: Secondary | ICD-10-CM | POA: Diagnosis not present

## 2018-08-05 DIAGNOSIS — Z794 Long term (current) use of insulin: Secondary | ICD-10-CM | POA: Diagnosis not present

## 2018-08-05 DIAGNOSIS — D649 Anemia, unspecified: Secondary | ICD-10-CM | POA: Diagnosis not present

## 2018-08-05 DIAGNOSIS — Z8673 Personal history of transient ischemic attack (TIA), and cerebral infarction without residual deficits: Secondary | ICD-10-CM | POA: Diagnosis not present

## 2018-08-05 DIAGNOSIS — N183 Chronic kidney disease, stage 3 (moderate): Secondary | ICD-10-CM | POA: Diagnosis not present

## 2018-08-07 DIAGNOSIS — N183 Chronic kidney disease, stage 3 (moderate): Secondary | ICD-10-CM | POA: Diagnosis not present

## 2018-08-07 DIAGNOSIS — I129 Hypertensive chronic kidney disease with stage 1 through stage 4 chronic kidney disease, or unspecified chronic kidney disease: Secondary | ICD-10-CM | POA: Diagnosis not present

## 2018-08-07 DIAGNOSIS — E1122 Type 2 diabetes mellitus with diabetic chronic kidney disease: Secondary | ICD-10-CM | POA: Diagnosis not present

## 2018-08-07 DIAGNOSIS — Z9181 History of falling: Secondary | ICD-10-CM | POA: Diagnosis not present

## 2018-08-07 DIAGNOSIS — Z794 Long term (current) use of insulin: Secondary | ICD-10-CM | POA: Diagnosis not present

## 2018-08-07 DIAGNOSIS — D649 Anemia, unspecified: Secondary | ICD-10-CM | POA: Diagnosis not present

## 2018-08-07 DIAGNOSIS — S72001D Fracture of unspecified part of neck of right femur, subsequent encounter for closed fracture with routine healing: Secondary | ICD-10-CM | POA: Diagnosis not present

## 2018-08-07 DIAGNOSIS — Z7982 Long term (current) use of aspirin: Secondary | ICD-10-CM | POA: Diagnosis not present

## 2018-08-07 DIAGNOSIS — Z8673 Personal history of transient ischemic attack (TIA), and cerebral infarction without residual deficits: Secondary | ICD-10-CM | POA: Diagnosis not present

## 2018-08-09 ENCOUNTER — Other Ambulatory Visit: Payer: Self-pay

## 2018-08-09 DIAGNOSIS — D649 Anemia, unspecified: Secondary | ICD-10-CM | POA: Diagnosis not present

## 2018-08-09 DIAGNOSIS — Z9181 History of falling: Secondary | ICD-10-CM | POA: Diagnosis not present

## 2018-08-09 DIAGNOSIS — Z7982 Long term (current) use of aspirin: Secondary | ICD-10-CM | POA: Diagnosis not present

## 2018-08-09 DIAGNOSIS — E1122 Type 2 diabetes mellitus with diabetic chronic kidney disease: Secondary | ICD-10-CM | POA: Diagnosis not present

## 2018-08-09 DIAGNOSIS — N183 Chronic kidney disease, stage 3 (moderate): Secondary | ICD-10-CM | POA: Diagnosis not present

## 2018-08-09 DIAGNOSIS — Z794 Long term (current) use of insulin: Secondary | ICD-10-CM | POA: Diagnosis not present

## 2018-08-09 DIAGNOSIS — Z8673 Personal history of transient ischemic attack (TIA), and cerebral infarction without residual deficits: Secondary | ICD-10-CM | POA: Diagnosis not present

## 2018-08-09 DIAGNOSIS — S72001D Fracture of unspecified part of neck of right femur, subsequent encounter for closed fracture with routine healing: Secondary | ICD-10-CM | POA: Diagnosis not present

## 2018-08-09 DIAGNOSIS — I129 Hypertensive chronic kidney disease with stage 1 through stage 4 chronic kidney disease, or unspecified chronic kidney disease: Secondary | ICD-10-CM | POA: Diagnosis not present

## 2018-08-09 NOTE — Patient Outreach (Signed)
[place] [facility]) Care Management  08/09/2018  [name] 09-30-1940 [id number]  Transition of care  Referral date: 08/08/18 Referral source: discharged from [place] home on 08/04/18 Insurance: [employer] Attempt #1  Telephone call to patient regarding transition of care referral. Unable to reach. HIPAA compliant voice message left with call back phone number.     PLAN:  RNCM will attempt 2nd telephone call to patient within 4 business days.  RNCM will send outreach letter to attempt contact   Quinn Plowman RN,BSN,CCM [facility] Telephonic  [phone]

## 2018-08-10 ENCOUNTER — Other Ambulatory Visit: Payer: Self-pay

## 2018-08-10 NOTE — Patient Outreach (Incomplete)
[facility]) Care Management  08/10/2018  [name] 07/18/1941 [id number]  Transition of care  Referral date:  Referral source:  Insurance:   Telephone call to patient regarding transition of care referral. Contact states he is patients spouse, [name]. Spouse listed as patients designated party release. Spouse states patient is currently in [facility] [place] skilled nursing facility due to a fractured hip. He states he is unsure when patient will be discharged and reports patient  Is scheduled to follow up with her doctor on 08/18/18.   RNCM provided spouse contact phone number for [facility] care management   PLAN: RNCM will close patient due to patient being admitted

## 2018-08-16 DIAGNOSIS — Z794 Long term (current) use of insulin: Secondary | ICD-10-CM | POA: Diagnosis not present

## 2018-08-16 DIAGNOSIS — E1122 Type 2 diabetes mellitus with diabetic chronic kidney disease: Secondary | ICD-10-CM | POA: Diagnosis not present

## 2018-08-16 DIAGNOSIS — I129 Hypertensive chronic kidney disease with stage 1 through stage 4 chronic kidney disease, or unspecified chronic kidney disease: Secondary | ICD-10-CM | POA: Diagnosis not present

## 2018-08-16 DIAGNOSIS — Z9181 History of falling: Secondary | ICD-10-CM | POA: Diagnosis not present

## 2018-08-16 DIAGNOSIS — Z8673 Personal history of transient ischemic attack (TIA), and cerebral infarction without residual deficits: Secondary | ICD-10-CM | POA: Diagnosis not present

## 2018-08-16 DIAGNOSIS — D649 Anemia, unspecified: Secondary | ICD-10-CM | POA: Diagnosis not present

## 2018-08-16 DIAGNOSIS — Z7982 Long term (current) use of aspirin: Secondary | ICD-10-CM | POA: Diagnosis not present

## 2018-08-16 DIAGNOSIS — N183 Chronic kidney disease, stage 3 (moderate): Secondary | ICD-10-CM | POA: Diagnosis not present

## 2018-08-16 DIAGNOSIS — S72001D Fracture of unspecified part of neck of right femur, subsequent encounter for closed fracture with routine healing: Secondary | ICD-10-CM | POA: Diagnosis not present

## 2018-08-17 DIAGNOSIS — Z7982 Long term (current) use of aspirin: Secondary | ICD-10-CM | POA: Diagnosis not present

## 2018-08-17 DIAGNOSIS — I129 Hypertensive chronic kidney disease with stage 1 through stage 4 chronic kidney disease, or unspecified chronic kidney disease: Secondary | ICD-10-CM | POA: Diagnosis not present

## 2018-08-17 DIAGNOSIS — D649 Anemia, unspecified: Secondary | ICD-10-CM | POA: Diagnosis not present

## 2018-08-17 DIAGNOSIS — S72001D Fracture of unspecified part of neck of right femur, subsequent encounter for closed fracture with routine healing: Secondary | ICD-10-CM | POA: Diagnosis not present

## 2018-08-17 DIAGNOSIS — N183 Chronic kidney disease, stage 3 (moderate): Secondary | ICD-10-CM | POA: Diagnosis not present

## 2018-08-17 DIAGNOSIS — E1122 Type 2 diabetes mellitus with diabetic chronic kidney disease: Secondary | ICD-10-CM | POA: Diagnosis not present

## 2018-08-17 DIAGNOSIS — Z8673 Personal history of transient ischemic attack (TIA), and cerebral infarction without residual deficits: Secondary | ICD-10-CM | POA: Diagnosis not present

## 2018-08-17 DIAGNOSIS — Z794 Long term (current) use of insulin: Secondary | ICD-10-CM | POA: Diagnosis not present

## 2018-08-17 DIAGNOSIS — Z9181 History of falling: Secondary | ICD-10-CM | POA: Diagnosis not present

## 2018-08-18 DIAGNOSIS — M1711 Unilateral primary osteoarthritis, right knee: Secondary | ICD-10-CM | POA: Diagnosis not present

## 2018-08-18 DIAGNOSIS — S72141D Displaced intertrochanteric fracture of right femur, subsequent encounter for closed fracture with routine healing: Secondary | ICD-10-CM | POA: Diagnosis not present

## 2018-08-18 DIAGNOSIS — Z4789 Encounter for other orthopedic aftercare: Secondary | ICD-10-CM | POA: Diagnosis not present

## 2018-08-18 DIAGNOSIS — M25761 Osteophyte, right knee: Secondary | ICD-10-CM | POA: Diagnosis not present

## 2018-08-21 DIAGNOSIS — S72001D Fracture of unspecified part of neck of right femur, subsequent encounter for closed fracture with routine healing: Secondary | ICD-10-CM | POA: Diagnosis not present

## 2018-08-21 DIAGNOSIS — E1122 Type 2 diabetes mellitus with diabetic chronic kidney disease: Secondary | ICD-10-CM | POA: Diagnosis not present

## 2018-08-21 DIAGNOSIS — Z7982 Long term (current) use of aspirin: Secondary | ICD-10-CM | POA: Diagnosis not present

## 2018-08-21 DIAGNOSIS — Z794 Long term (current) use of insulin: Secondary | ICD-10-CM | POA: Diagnosis not present

## 2018-08-21 DIAGNOSIS — N183 Chronic kidney disease, stage 3 (moderate): Secondary | ICD-10-CM | POA: Diagnosis not present

## 2018-08-21 DIAGNOSIS — Z9181 History of falling: Secondary | ICD-10-CM | POA: Diagnosis not present

## 2018-08-21 DIAGNOSIS — D649 Anemia, unspecified: Secondary | ICD-10-CM | POA: Diagnosis not present

## 2018-08-21 DIAGNOSIS — I129 Hypertensive chronic kidney disease with stage 1 through stage 4 chronic kidney disease, or unspecified chronic kidney disease: Secondary | ICD-10-CM | POA: Diagnosis not present

## 2018-08-21 DIAGNOSIS — Z8673 Personal history of transient ischemic attack (TIA), and cerebral infarction without residual deficits: Secondary | ICD-10-CM | POA: Diagnosis not present

## 2018-08-23 DIAGNOSIS — D649 Anemia, unspecified: Secondary | ICD-10-CM | POA: Diagnosis not present

## 2018-08-23 DIAGNOSIS — I129 Hypertensive chronic kidney disease with stage 1 through stage 4 chronic kidney disease, or unspecified chronic kidney disease: Secondary | ICD-10-CM | POA: Diagnosis not present

## 2018-08-23 DIAGNOSIS — N183 Chronic kidney disease, stage 3 (moderate): Secondary | ICD-10-CM | POA: Diagnosis not present

## 2018-08-23 DIAGNOSIS — E1122 Type 2 diabetes mellitus with diabetic chronic kidney disease: Secondary | ICD-10-CM | POA: Diagnosis not present

## 2018-08-23 DIAGNOSIS — Z8673 Personal history of transient ischemic attack (TIA), and cerebral infarction without residual deficits: Secondary | ICD-10-CM | POA: Diagnosis not present

## 2018-08-23 DIAGNOSIS — Z7982 Long term (current) use of aspirin: Secondary | ICD-10-CM | POA: Diagnosis not present

## 2018-08-23 DIAGNOSIS — Z794 Long term (current) use of insulin: Secondary | ICD-10-CM | POA: Diagnosis not present

## 2018-08-23 DIAGNOSIS — Z9181 History of falling: Secondary | ICD-10-CM | POA: Diagnosis not present

## 2018-08-23 DIAGNOSIS — S72001D Fracture of unspecified part of neck of right femur, subsequent encounter for closed fracture with routine healing: Secondary | ICD-10-CM | POA: Diagnosis not present

## 2018-08-25 DIAGNOSIS — Z7982 Long term (current) use of aspirin: Secondary | ICD-10-CM | POA: Diagnosis not present

## 2018-08-25 DIAGNOSIS — S72001D Fracture of unspecified part of neck of right femur, subsequent encounter for closed fracture with routine healing: Secondary | ICD-10-CM | POA: Diagnosis not present

## 2018-08-25 DIAGNOSIS — Z8673 Personal history of transient ischemic attack (TIA), and cerebral infarction without residual deficits: Secondary | ICD-10-CM | POA: Diagnosis not present

## 2018-08-25 DIAGNOSIS — Z794 Long term (current) use of insulin: Secondary | ICD-10-CM | POA: Diagnosis not present

## 2018-08-25 DIAGNOSIS — N183 Chronic kidney disease, stage 3 (moderate): Secondary | ICD-10-CM | POA: Diagnosis not present

## 2018-08-25 DIAGNOSIS — D649 Anemia, unspecified: Secondary | ICD-10-CM | POA: Diagnosis not present

## 2018-08-25 DIAGNOSIS — Z9181 History of falling: Secondary | ICD-10-CM | POA: Diagnosis not present

## 2018-08-25 DIAGNOSIS — I129 Hypertensive chronic kidney disease with stage 1 through stage 4 chronic kidney disease, or unspecified chronic kidney disease: Secondary | ICD-10-CM | POA: Diagnosis not present

## 2018-08-25 DIAGNOSIS — E1122 Type 2 diabetes mellitus with diabetic chronic kidney disease: Secondary | ICD-10-CM | POA: Diagnosis not present

## 2018-08-28 DIAGNOSIS — S72001D Fracture of unspecified part of neck of right femur, subsequent encounter for closed fracture with routine healing: Secondary | ICD-10-CM | POA: Diagnosis not present

## 2018-08-28 DIAGNOSIS — N183 Chronic kidney disease, stage 3 (moderate): Secondary | ICD-10-CM | POA: Diagnosis not present

## 2018-08-28 DIAGNOSIS — E1122 Type 2 diabetes mellitus with diabetic chronic kidney disease: Secondary | ICD-10-CM | POA: Diagnosis not present

## 2018-08-28 DIAGNOSIS — D649 Anemia, unspecified: Secondary | ICD-10-CM | POA: Diagnosis not present

## 2018-08-28 DIAGNOSIS — I129 Hypertensive chronic kidney disease with stage 1 through stage 4 chronic kidney disease, or unspecified chronic kidney disease: Secondary | ICD-10-CM | POA: Diagnosis not present

## 2018-08-28 DIAGNOSIS — Z9181 History of falling: Secondary | ICD-10-CM | POA: Diagnosis not present

## 2018-08-28 DIAGNOSIS — Z8673 Personal history of transient ischemic attack (TIA), and cerebral infarction without residual deficits: Secondary | ICD-10-CM | POA: Diagnosis not present

## 2018-08-28 DIAGNOSIS — Z7982 Long term (current) use of aspirin: Secondary | ICD-10-CM | POA: Diagnosis not present

## 2018-08-28 DIAGNOSIS — Z794 Long term (current) use of insulin: Secondary | ICD-10-CM | POA: Diagnosis not present

## 2018-08-29 DIAGNOSIS — Z794 Long term (current) use of insulin: Secondary | ICD-10-CM | POA: Diagnosis not present

## 2018-08-29 DIAGNOSIS — E1122 Type 2 diabetes mellitus with diabetic chronic kidney disease: Secondary | ICD-10-CM | POA: Diagnosis not present

## 2018-08-29 DIAGNOSIS — N183 Chronic kidney disease, stage 3 (moderate): Secondary | ICD-10-CM | POA: Diagnosis not present

## 2018-08-29 DIAGNOSIS — Z8673 Personal history of transient ischemic attack (TIA), and cerebral infarction without residual deficits: Secondary | ICD-10-CM | POA: Diagnosis not present

## 2018-08-29 DIAGNOSIS — Z9181 History of falling: Secondary | ICD-10-CM | POA: Diagnosis not present

## 2018-08-29 DIAGNOSIS — Z7982 Long term (current) use of aspirin: Secondary | ICD-10-CM | POA: Diagnosis not present

## 2018-08-29 DIAGNOSIS — S72001D Fracture of unspecified part of neck of right femur, subsequent encounter for closed fracture with routine healing: Secondary | ICD-10-CM | POA: Diagnosis not present

## 2018-08-29 DIAGNOSIS — I129 Hypertensive chronic kidney disease with stage 1 through stage 4 chronic kidney disease, or unspecified chronic kidney disease: Secondary | ICD-10-CM | POA: Diagnosis not present

## 2018-08-29 DIAGNOSIS — D649 Anemia, unspecified: Secondary | ICD-10-CM | POA: Diagnosis not present

## 2018-08-30 DIAGNOSIS — N183 Chronic kidney disease, stage 3 (moderate): Secondary | ICD-10-CM | POA: Diagnosis not present

## 2018-08-30 DIAGNOSIS — E1122 Type 2 diabetes mellitus with diabetic chronic kidney disease: Secondary | ICD-10-CM | POA: Diagnosis not present

## 2018-08-30 DIAGNOSIS — I129 Hypertensive chronic kidney disease with stage 1 through stage 4 chronic kidney disease, or unspecified chronic kidney disease: Secondary | ICD-10-CM | POA: Diagnosis not present

## 2018-08-30 DIAGNOSIS — D649 Anemia, unspecified: Secondary | ICD-10-CM | POA: Diagnosis not present

## 2018-08-30 DIAGNOSIS — Z9181 History of falling: Secondary | ICD-10-CM | POA: Diagnosis not present

## 2018-08-30 DIAGNOSIS — Z8673 Personal history of transient ischemic attack (TIA), and cerebral infarction without residual deficits: Secondary | ICD-10-CM | POA: Diagnosis not present

## 2018-08-30 DIAGNOSIS — Z794 Long term (current) use of insulin: Secondary | ICD-10-CM | POA: Diagnosis not present

## 2018-08-30 DIAGNOSIS — Z7982 Long term (current) use of aspirin: Secondary | ICD-10-CM | POA: Diagnosis not present

## 2018-08-30 DIAGNOSIS — S72001D Fracture of unspecified part of neck of right femur, subsequent encounter for closed fracture with routine healing: Secondary | ICD-10-CM | POA: Diagnosis not present

## 2018-09-04 DIAGNOSIS — Z8673 Personal history of transient ischemic attack (TIA), and cerebral infarction without residual deficits: Secondary | ICD-10-CM | POA: Diagnosis not present

## 2018-09-04 DIAGNOSIS — Z9181 History of falling: Secondary | ICD-10-CM | POA: Diagnosis not present

## 2018-09-04 DIAGNOSIS — E1122 Type 2 diabetes mellitus with diabetic chronic kidney disease: Secondary | ICD-10-CM | POA: Diagnosis not present

## 2018-09-04 DIAGNOSIS — Z7982 Long term (current) use of aspirin: Secondary | ICD-10-CM | POA: Diagnosis not present

## 2018-09-04 DIAGNOSIS — S72001D Fracture of unspecified part of neck of right femur, subsequent encounter for closed fracture with routine healing: Secondary | ICD-10-CM | POA: Diagnosis not present

## 2018-09-04 DIAGNOSIS — D649 Anemia, unspecified: Secondary | ICD-10-CM | POA: Diagnosis not present

## 2018-09-04 DIAGNOSIS — N183 Chronic kidney disease, stage 3 (moderate): Secondary | ICD-10-CM | POA: Diagnosis not present

## 2018-09-04 DIAGNOSIS — Z794 Long term (current) use of insulin: Secondary | ICD-10-CM | POA: Diagnosis not present

## 2018-09-04 DIAGNOSIS — I129 Hypertensive chronic kidney disease with stage 1 through stage 4 chronic kidney disease, or unspecified chronic kidney disease: Secondary | ICD-10-CM | POA: Diagnosis not present

## 2018-09-05 DIAGNOSIS — Z794 Long term (current) use of insulin: Secondary | ICD-10-CM | POA: Diagnosis not present

## 2018-09-05 DIAGNOSIS — I129 Hypertensive chronic kidney disease with stage 1 through stage 4 chronic kidney disease, or unspecified chronic kidney disease: Secondary | ICD-10-CM | POA: Diagnosis not present

## 2018-09-05 DIAGNOSIS — Z9181 History of falling: Secondary | ICD-10-CM | POA: Diagnosis not present

## 2018-09-05 DIAGNOSIS — Z7982 Long term (current) use of aspirin: Secondary | ICD-10-CM | POA: Diagnosis not present

## 2018-09-05 DIAGNOSIS — D649 Anemia, unspecified: Secondary | ICD-10-CM | POA: Diagnosis not present

## 2018-09-05 DIAGNOSIS — N183 Chronic kidney disease, stage 3 (moderate): Secondary | ICD-10-CM | POA: Diagnosis not present

## 2018-09-05 DIAGNOSIS — Z8673 Personal history of transient ischemic attack (TIA), and cerebral infarction without residual deficits: Secondary | ICD-10-CM | POA: Diagnosis not present

## 2018-09-05 DIAGNOSIS — E1122 Type 2 diabetes mellitus with diabetic chronic kidney disease: Secondary | ICD-10-CM | POA: Diagnosis not present

## 2018-09-05 DIAGNOSIS — S72001D Fracture of unspecified part of neck of right femur, subsequent encounter for closed fracture with routine healing: Secondary | ICD-10-CM | POA: Diagnosis not present

## 2018-09-11 DIAGNOSIS — I129 Hypertensive chronic kidney disease with stage 1 through stage 4 chronic kidney disease, or unspecified chronic kidney disease: Secondary | ICD-10-CM | POA: Diagnosis not present

## 2018-09-11 DIAGNOSIS — Z7982 Long term (current) use of aspirin: Secondary | ICD-10-CM | POA: Diagnosis not present

## 2018-09-11 DIAGNOSIS — Z9181 History of falling: Secondary | ICD-10-CM | POA: Diagnosis not present

## 2018-09-11 DIAGNOSIS — N183 Chronic kidney disease, stage 3 (moderate): Secondary | ICD-10-CM | POA: Diagnosis not present

## 2018-09-11 DIAGNOSIS — E1122 Type 2 diabetes mellitus with diabetic chronic kidney disease: Secondary | ICD-10-CM | POA: Diagnosis not present

## 2018-09-11 DIAGNOSIS — S72001D Fracture of unspecified part of neck of right femur, subsequent encounter for closed fracture with routine healing: Secondary | ICD-10-CM | POA: Diagnosis not present

## 2018-09-11 DIAGNOSIS — D649 Anemia, unspecified: Secondary | ICD-10-CM | POA: Diagnosis not present

## 2018-09-11 DIAGNOSIS — Z8673 Personal history of transient ischemic attack (TIA), and cerebral infarction without residual deficits: Secondary | ICD-10-CM | POA: Diagnosis not present

## 2018-09-11 DIAGNOSIS — Z794 Long term (current) use of insulin: Secondary | ICD-10-CM | POA: Diagnosis not present

## 2018-09-12 DIAGNOSIS — I129 Hypertensive chronic kidney disease with stage 1 through stage 4 chronic kidney disease, or unspecified chronic kidney disease: Secondary | ICD-10-CM | POA: Diagnosis not present

## 2018-09-12 DIAGNOSIS — Z9181 History of falling: Secondary | ICD-10-CM | POA: Diagnosis not present

## 2018-09-12 DIAGNOSIS — Z7982 Long term (current) use of aspirin: Secondary | ICD-10-CM | POA: Diagnosis not present

## 2018-09-12 DIAGNOSIS — N183 Chronic kidney disease, stage 3 (moderate): Secondary | ICD-10-CM | POA: Diagnosis not present

## 2018-09-12 DIAGNOSIS — D649 Anemia, unspecified: Secondary | ICD-10-CM | POA: Diagnosis not present

## 2018-09-12 DIAGNOSIS — E1122 Type 2 diabetes mellitus with diabetic chronic kidney disease: Secondary | ICD-10-CM | POA: Diagnosis not present

## 2018-09-12 DIAGNOSIS — Z8673 Personal history of transient ischemic attack (TIA), and cerebral infarction without residual deficits: Secondary | ICD-10-CM | POA: Diagnosis not present

## 2018-09-12 DIAGNOSIS — S72001D Fracture of unspecified part of neck of right femur, subsequent encounter for closed fracture with routine healing: Secondary | ICD-10-CM | POA: Diagnosis not present

## 2018-09-12 DIAGNOSIS — Z794 Long term (current) use of insulin: Secondary | ICD-10-CM | POA: Diagnosis not present

## 2018-09-14 DIAGNOSIS — N800 Endometriosis of uterus: Secondary | ICD-10-CM | POA: Diagnosis not present

## 2018-09-15 DIAGNOSIS — S72001D Fracture of unspecified part of neck of right femur, subsequent encounter for closed fracture with routine healing: Secondary | ICD-10-CM | POA: Diagnosis not present

## 2018-09-15 DIAGNOSIS — Z9181 History of falling: Secondary | ICD-10-CM | POA: Diagnosis not present

## 2018-09-15 DIAGNOSIS — Z794 Long term (current) use of insulin: Secondary | ICD-10-CM | POA: Diagnosis not present

## 2018-09-15 DIAGNOSIS — I129 Hypertensive chronic kidney disease with stage 1 through stage 4 chronic kidney disease, or unspecified chronic kidney disease: Secondary | ICD-10-CM | POA: Diagnosis not present

## 2018-09-15 DIAGNOSIS — N183 Chronic kidney disease, stage 3 (moderate): Secondary | ICD-10-CM | POA: Diagnosis not present

## 2018-09-15 DIAGNOSIS — D649 Anemia, unspecified: Secondary | ICD-10-CM | POA: Diagnosis not present

## 2018-09-15 DIAGNOSIS — N390 Urinary tract infection, site not specified: Secondary | ICD-10-CM | POA: Diagnosis not present

## 2018-09-15 DIAGNOSIS — Z7982 Long term (current) use of aspirin: Secondary | ICD-10-CM | POA: Diagnosis not present

## 2018-09-15 DIAGNOSIS — Z8673 Personal history of transient ischemic attack (TIA), and cerebral infarction without residual deficits: Secondary | ICD-10-CM | POA: Diagnosis not present

## 2018-09-15 DIAGNOSIS — E1122 Type 2 diabetes mellitus with diabetic chronic kidney disease: Secondary | ICD-10-CM | POA: Diagnosis not present

## 2018-09-18 DIAGNOSIS — I129 Hypertensive chronic kidney disease with stage 1 through stage 4 chronic kidney disease, or unspecified chronic kidney disease: Secondary | ICD-10-CM | POA: Diagnosis not present

## 2018-09-18 DIAGNOSIS — Z794 Long term (current) use of insulin: Secondary | ICD-10-CM | POA: Diagnosis not present

## 2018-09-18 DIAGNOSIS — Z9181 History of falling: Secondary | ICD-10-CM | POA: Diagnosis not present

## 2018-09-18 DIAGNOSIS — S72001D Fracture of unspecified part of neck of right femur, subsequent encounter for closed fracture with routine healing: Secondary | ICD-10-CM | POA: Diagnosis not present

## 2018-09-18 DIAGNOSIS — E1122 Type 2 diabetes mellitus with diabetic chronic kidney disease: Secondary | ICD-10-CM | POA: Diagnosis not present

## 2018-09-18 DIAGNOSIS — Z7982 Long term (current) use of aspirin: Secondary | ICD-10-CM | POA: Diagnosis not present

## 2018-09-18 DIAGNOSIS — Z8673 Personal history of transient ischemic attack (TIA), and cerebral infarction without residual deficits: Secondary | ICD-10-CM | POA: Diagnosis not present

## 2018-09-18 DIAGNOSIS — D649 Anemia, unspecified: Secondary | ICD-10-CM | POA: Diagnosis not present

## 2018-09-18 DIAGNOSIS — N183 Chronic kidney disease, stage 3 (moderate): Secondary | ICD-10-CM | POA: Diagnosis not present

## 2018-09-22 DIAGNOSIS — E1122 Type 2 diabetes mellitus with diabetic chronic kidney disease: Secondary | ICD-10-CM | POA: Diagnosis not present

## 2018-09-22 DIAGNOSIS — S72001D Fracture of unspecified part of neck of right femur, subsequent encounter for closed fracture with routine healing: Secondary | ICD-10-CM | POA: Diagnosis not present

## 2018-09-22 DIAGNOSIS — Z9181 History of falling: Secondary | ICD-10-CM | POA: Diagnosis not present

## 2018-09-22 DIAGNOSIS — Z7982 Long term (current) use of aspirin: Secondary | ICD-10-CM | POA: Diagnosis not present

## 2018-09-22 DIAGNOSIS — I129 Hypertensive chronic kidney disease with stage 1 through stage 4 chronic kidney disease, or unspecified chronic kidney disease: Secondary | ICD-10-CM | POA: Diagnosis not present

## 2018-09-22 DIAGNOSIS — Z794 Long term (current) use of insulin: Secondary | ICD-10-CM | POA: Diagnosis not present

## 2018-09-22 DIAGNOSIS — N183 Chronic kidney disease, stage 3 (moderate): Secondary | ICD-10-CM | POA: Diagnosis not present

## 2018-09-22 DIAGNOSIS — D649 Anemia, unspecified: Secondary | ICD-10-CM | POA: Diagnosis not present

## 2018-09-22 DIAGNOSIS — Z8673 Personal history of transient ischemic attack (TIA), and cerebral infarction without residual deficits: Secondary | ICD-10-CM | POA: Diagnosis not present

## 2018-09-25 DIAGNOSIS — Z7982 Long term (current) use of aspirin: Secondary | ICD-10-CM | POA: Diagnosis not present

## 2018-09-25 DIAGNOSIS — N183 Chronic kidney disease, stage 3 (moderate): Secondary | ICD-10-CM | POA: Diagnosis not present

## 2018-09-25 DIAGNOSIS — D649 Anemia, unspecified: Secondary | ICD-10-CM | POA: Diagnosis not present

## 2018-09-25 DIAGNOSIS — Z8673 Personal history of transient ischemic attack (TIA), and cerebral infarction without residual deficits: Secondary | ICD-10-CM | POA: Diagnosis not present

## 2018-09-25 DIAGNOSIS — E1122 Type 2 diabetes mellitus with diabetic chronic kidney disease: Secondary | ICD-10-CM | POA: Diagnosis not present

## 2018-09-25 DIAGNOSIS — Z9181 History of falling: Secondary | ICD-10-CM | POA: Diagnosis not present

## 2018-09-25 DIAGNOSIS — S72001D Fracture of unspecified part of neck of right femur, subsequent encounter for closed fracture with routine healing: Secondary | ICD-10-CM | POA: Diagnosis not present

## 2018-09-25 DIAGNOSIS — I129 Hypertensive chronic kidney disease with stage 1 through stage 4 chronic kidney disease, or unspecified chronic kidney disease: Secondary | ICD-10-CM | POA: Diagnosis not present

## 2018-09-25 DIAGNOSIS — Z794 Long term (current) use of insulin: Secondary | ICD-10-CM | POA: Diagnosis not present

## 2018-10-03 DIAGNOSIS — Z7982 Long term (current) use of aspirin: Secondary | ICD-10-CM | POA: Diagnosis not present

## 2018-10-03 DIAGNOSIS — D649 Anemia, unspecified: Secondary | ICD-10-CM | POA: Diagnosis not present

## 2018-10-03 DIAGNOSIS — Z9181 History of falling: Secondary | ICD-10-CM | POA: Diagnosis not present

## 2018-10-03 DIAGNOSIS — Z8673 Personal history of transient ischemic attack (TIA), and cerebral infarction without residual deficits: Secondary | ICD-10-CM | POA: Diagnosis not present

## 2018-10-03 DIAGNOSIS — E1122 Type 2 diabetes mellitus with diabetic chronic kidney disease: Secondary | ICD-10-CM | POA: Diagnosis not present

## 2018-10-03 DIAGNOSIS — I129 Hypertensive chronic kidney disease with stage 1 through stage 4 chronic kidney disease, or unspecified chronic kidney disease: Secondary | ICD-10-CM | POA: Diagnosis not present

## 2018-10-03 DIAGNOSIS — N183 Chronic kidney disease, stage 3 (moderate): Secondary | ICD-10-CM | POA: Diagnosis not present

## 2018-10-03 DIAGNOSIS — S72001D Fracture of unspecified part of neck of right femur, subsequent encounter for closed fracture with routine healing: Secondary | ICD-10-CM | POA: Diagnosis not present

## 2018-10-03 DIAGNOSIS — Z794 Long term (current) use of insulin: Secondary | ICD-10-CM | POA: Diagnosis not present

## 2018-10-31 DIAGNOSIS — E1169 Type 2 diabetes mellitus with other specified complication: Secondary | ICD-10-CM | POA: Diagnosis not present

## 2018-10-31 DIAGNOSIS — E782 Mixed hyperlipidemia: Secondary | ICD-10-CM | POA: Diagnosis not present

## 2018-10-31 DIAGNOSIS — E113312 Type 2 diabetes mellitus with moderate nonproliferative diabetic retinopathy with macular edema, left eye: Secondary | ICD-10-CM | POA: Diagnosis not present

## 2018-10-31 DIAGNOSIS — E1142 Type 2 diabetes mellitus with diabetic polyneuropathy: Secondary | ICD-10-CM | POA: Diagnosis not present

## 2018-10-31 DIAGNOSIS — N183 Chronic kidney disease, stage 3 (moderate): Secondary | ICD-10-CM | POA: Diagnosis not present

## 2018-10-31 DIAGNOSIS — I10 Essential (primary) hypertension: Secondary | ICD-10-CM | POA: Diagnosis not present

## 2018-10-31 DIAGNOSIS — Z6830 Body mass index (BMI) 30.0-30.9, adult: Secondary | ICD-10-CM | POA: Diagnosis not present

## 2018-10-31 DIAGNOSIS — M810 Age-related osteoporosis without current pathological fracture: Secondary | ICD-10-CM | POA: Diagnosis not present

## 2018-10-31 DIAGNOSIS — Z0000 Encounter for general adult medical examination without abnormal findings: Secondary | ICD-10-CM | POA: Diagnosis not present

## 2019-01-18 DIAGNOSIS — N183 Chronic kidney disease, stage 3 (moderate): Secondary | ICD-10-CM | POA: Diagnosis not present

## 2019-01-18 DIAGNOSIS — R609 Edema, unspecified: Secondary | ICD-10-CM | POA: Diagnosis not present

## 2019-01-18 DIAGNOSIS — Z6832 Body mass index (BMI) 32.0-32.9, adult: Secondary | ICD-10-CM | POA: Diagnosis not present

## 2019-01-18 DIAGNOSIS — R300 Dysuria: Secondary | ICD-10-CM | POA: Diagnosis not present

## 2019-01-22 DIAGNOSIS — N184 Chronic kidney disease, stage 4 (severe): Secondary | ICD-10-CM | POA: Diagnosis not present

## 2019-01-22 DIAGNOSIS — R609 Edema, unspecified: Secondary | ICD-10-CM | POA: Diagnosis not present

## 2019-01-29 DIAGNOSIS — Z6831 Body mass index (BMI) 31.0-31.9, adult: Secondary | ICD-10-CM | POA: Diagnosis not present

## 2019-01-29 DIAGNOSIS — I83002 Varicose veins of unspecified lower extremity with ulcer of calf: Secondary | ICD-10-CM | POA: Diagnosis not present

## 2019-01-29 DIAGNOSIS — B372 Candidiasis of skin and nail: Secondary | ICD-10-CM | POA: Diagnosis not present

## 2019-01-29 DIAGNOSIS — N184 Chronic kidney disease, stage 4 (severe): Secondary | ICD-10-CM | POA: Diagnosis not present

## 2019-01-29 DIAGNOSIS — L97209 Non-pressure chronic ulcer of unspecified calf with unspecified severity: Secondary | ICD-10-CM | POA: Diagnosis not present

## 2019-01-31 DIAGNOSIS — Z794 Long term (current) use of insulin: Secondary | ICD-10-CM | POA: Diagnosis not present

## 2019-01-31 DIAGNOSIS — S81802A Unspecified open wound, left lower leg, initial encounter: Secondary | ICD-10-CM | POA: Diagnosis not present

## 2019-01-31 DIAGNOSIS — E11622 Type 2 diabetes mellitus with other skin ulcer: Secondary | ICD-10-CM | POA: Diagnosis not present

## 2019-01-31 DIAGNOSIS — I10 Essential (primary) hypertension: Secondary | ICD-10-CM | POA: Diagnosis not present

## 2019-01-31 DIAGNOSIS — L97822 Non-pressure chronic ulcer of other part of left lower leg with fat layer exposed: Secondary | ICD-10-CM | POA: Diagnosis not present

## 2019-01-31 DIAGNOSIS — Z86718 Personal history of other venous thrombosis and embolism: Secondary | ICD-10-CM | POA: Diagnosis not present

## 2019-01-31 DIAGNOSIS — E1140 Type 2 diabetes mellitus with diabetic neuropathy, unspecified: Secondary | ICD-10-CM | POA: Diagnosis not present

## 2019-02-06 DIAGNOSIS — L97822 Non-pressure chronic ulcer of other part of left lower leg with fat layer exposed: Secondary | ICD-10-CM | POA: Diagnosis not present

## 2019-02-06 DIAGNOSIS — E11622 Type 2 diabetes mellitus with other skin ulcer: Secondary | ICD-10-CM | POA: Diagnosis not present

## 2019-02-08 DIAGNOSIS — Z8781 Personal history of (healed) traumatic fracture: Secondary | ICD-10-CM | POA: Diagnosis not present

## 2019-02-08 DIAGNOSIS — E119 Type 2 diabetes mellitus without complications: Secondary | ICD-10-CM | POA: Diagnosis not present

## 2019-02-08 DIAGNOSIS — M810 Age-related osteoporosis without current pathological fracture: Secondary | ICD-10-CM | POA: Diagnosis not present

## 2019-02-08 DIAGNOSIS — I129 Hypertensive chronic kidney disease with stage 1 through stage 4 chronic kidney disease, or unspecified chronic kidney disease: Secondary | ICD-10-CM | POA: Diagnosis not present

## 2019-02-08 DIAGNOSIS — R921 Mammographic calcification found on diagnostic imaging of breast: Secondary | ICD-10-CM | POA: Diagnosis not present

## 2019-02-08 DIAGNOSIS — E1122 Type 2 diabetes mellitus with diabetic chronic kidney disease: Secondary | ICD-10-CM | POA: Diagnosis not present

## 2019-02-08 DIAGNOSIS — Z90710 Acquired absence of both cervix and uterus: Secondary | ICD-10-CM | POA: Diagnosis not present

## 2019-02-08 DIAGNOSIS — N183 Chronic kidney disease, stage 3 (moderate): Secondary | ICD-10-CM | POA: Diagnosis not present

## 2019-02-08 DIAGNOSIS — Z8262 Family history of osteoporosis: Secondary | ICD-10-CM | POA: Diagnosis not present

## 2019-02-08 DIAGNOSIS — N049 Nephrotic syndrome with unspecified morphologic changes: Secondary | ICD-10-CM | POA: Diagnosis not present

## 2019-02-08 DIAGNOSIS — D631 Anemia in chronic kidney disease: Secondary | ICD-10-CM | POA: Diagnosis not present

## 2019-02-08 DIAGNOSIS — N189 Chronic kidney disease, unspecified: Secondary | ICD-10-CM | POA: Diagnosis not present

## 2019-02-08 DIAGNOSIS — Z853 Personal history of malignant neoplasm of breast: Secondary | ICD-10-CM | POA: Diagnosis not present

## 2019-02-13 DIAGNOSIS — E11622 Type 2 diabetes mellitus with other skin ulcer: Secondary | ICD-10-CM | POA: Diagnosis not present

## 2019-02-13 DIAGNOSIS — L97822 Non-pressure chronic ulcer of other part of left lower leg with fat layer exposed: Secondary | ICD-10-CM | POA: Diagnosis not present

## 2019-02-19 DIAGNOSIS — N183 Chronic kidney disease, stage 3 (moderate): Secondary | ICD-10-CM | POA: Diagnosis not present

## 2019-02-20 ENCOUNTER — Telehealth: Payer: Self-pay

## 2019-02-20 DIAGNOSIS — E11622 Type 2 diabetes mellitus with other skin ulcer: Secondary | ICD-10-CM | POA: Diagnosis not present

## 2019-02-20 DIAGNOSIS — L97822 Non-pressure chronic ulcer of other part of left lower leg with fat layer exposed: Secondary | ICD-10-CM | POA: Diagnosis not present

## 2019-02-20 NOTE — Telephone Encounter (Signed)
RN spoke with patient regarding bone density results.  Pt voiced understanding and verbalized she will consult with PCP prior to start.  Pt will notify clinic with any additional needs.

## 2019-02-20 NOTE — Telephone Encounter (Signed)
MD reviewed bone density results showing, osteoporosis.  Per MD, initiate bisphosphonate, Fosamax 70mg  q week.    RN left voicemail for patient to return call.

## 2019-02-22 DIAGNOSIS — Z853 Personal history of malignant neoplasm of breast: Secondary | ICD-10-CM | POA: Diagnosis not present

## 2019-02-22 DIAGNOSIS — I129 Hypertensive chronic kidney disease with stage 1 through stage 4 chronic kidney disease, or unspecified chronic kidney disease: Secondary | ICD-10-CM | POA: Diagnosis not present

## 2019-02-22 DIAGNOSIS — N183 Chronic kidney disease, stage 3 (moderate): Secondary | ICD-10-CM | POA: Diagnosis not present

## 2019-02-22 DIAGNOSIS — E1122 Type 2 diabetes mellitus with diabetic chronic kidney disease: Secondary | ICD-10-CM | POA: Diagnosis not present

## 2019-02-23 ENCOUNTER — Ambulatory Visit (HOSPITAL_COMMUNITY)
Admission: RE | Admit: 2019-02-23 | Discharge: 2019-02-23 | Disposition: A | Discharge status: Home / Self Care | Payer: Medicare HMO | Source: Ambulatory Visit | Attending: Family | Admitting: Family

## 2019-02-23 ENCOUNTER — Other Ambulatory Visit: Payer: Self-pay

## 2019-02-23 ENCOUNTER — Other Ambulatory Visit (HOSPITAL_COMMUNITY): Payer: Self-pay | Admitting: General Surgery

## 2019-02-23 ENCOUNTER — Other Ambulatory Visit: Payer: Self-pay | Admitting: Vascular Surgery

## 2019-02-23 DIAGNOSIS — R600 Localized edema: Secondary | ICD-10-CM

## 2019-02-23 DIAGNOSIS — I872 Venous insufficiency (chronic) (peripheral): Secondary | ICD-10-CM

## 2019-02-26 ENCOUNTER — Ambulatory Visit (HOSPITAL_COMMUNITY)
Admission: RE | Admit: 2019-02-26 | Discharge: 2019-02-26 | Disposition: A | Discharge status: Home / Self Care | Payer: Medicare HMO | Source: Ambulatory Visit | Attending: Family | Admitting: Family

## 2019-02-26 ENCOUNTER — Other Ambulatory Visit: Payer: Self-pay

## 2019-02-26 DIAGNOSIS — I872 Venous insufficiency (chronic) (peripheral): Secondary | ICD-10-CM | POA: Insufficient documentation

## 2019-02-27 DIAGNOSIS — E11622 Type 2 diabetes mellitus with other skin ulcer: Secondary | ICD-10-CM | POA: Diagnosis not present

## 2019-02-27 DIAGNOSIS — L97822 Non-pressure chronic ulcer of other part of left lower leg with fat layer exposed: Secondary | ICD-10-CM | POA: Diagnosis not present

## 2019-03-06 DIAGNOSIS — E11621 Type 2 diabetes mellitus with foot ulcer: Secondary | ICD-10-CM | POA: Diagnosis not present

## 2019-03-06 DIAGNOSIS — E11622 Type 2 diabetes mellitus with other skin ulcer: Secondary | ICD-10-CM | POA: Diagnosis not present

## 2019-03-06 DIAGNOSIS — I10 Essential (primary) hypertension: Secondary | ICD-10-CM | POA: Diagnosis not present

## 2019-03-06 DIAGNOSIS — L97822 Non-pressure chronic ulcer of other part of left lower leg with fat layer exposed: Secondary | ICD-10-CM | POA: Diagnosis not present

## 2019-03-07 ENCOUNTER — Other Ambulatory Visit (HOSPITAL_COMMUNITY): Payer: Self-pay | Admitting: Nephrology

## 2019-03-07 DIAGNOSIS — N1832 Chronic kidney disease, stage 3b: Secondary | ICD-10-CM

## 2019-03-07 DIAGNOSIS — Z853 Personal history of malignant neoplasm of breast: Secondary | ICD-10-CM

## 2019-03-07 DIAGNOSIS — I129 Hypertensive chronic kidney disease with stage 1 through stage 4 chronic kidney disease, or unspecified chronic kidney disease: Secondary | ICD-10-CM

## 2019-03-07 DIAGNOSIS — E1122 Type 2 diabetes mellitus with diabetic chronic kidney disease: Secondary | ICD-10-CM

## 2019-03-13 DIAGNOSIS — E11622 Type 2 diabetes mellitus with other skin ulcer: Secondary | ICD-10-CM | POA: Diagnosis not present

## 2019-03-13 DIAGNOSIS — L97822 Non-pressure chronic ulcer of other part of left lower leg with fat layer exposed: Secondary | ICD-10-CM | POA: Diagnosis not present

## 2019-03-13 DIAGNOSIS — L97812 Non-pressure chronic ulcer of other part of right lower leg with fat layer exposed: Secondary | ICD-10-CM | POA: Diagnosis not present

## 2019-03-15 ENCOUNTER — Other Ambulatory Visit: Payer: Self-pay | Admitting: Radiology

## 2019-03-16 ENCOUNTER — Ambulatory Visit (HOSPITAL_COMMUNITY)
Admission: RE | Admit: 2019-03-16 | Discharge: 2019-03-16 | Disposition: A | Discharge status: Home / Self Care | Payer: Medicare HMO | Source: Ambulatory Visit | Attending: Nephrology | Admitting: Nephrology

## 2019-03-16 ENCOUNTER — Other Ambulatory Visit: Payer: Self-pay

## 2019-03-16 ENCOUNTER — Encounter (HOSPITAL_COMMUNITY): Payer: Self-pay

## 2019-03-16 DIAGNOSIS — I129 Hypertensive chronic kidney disease with stage 1 through stage 4 chronic kidney disease, or unspecified chronic kidney disease: Secondary | ICD-10-CM

## 2019-03-16 DIAGNOSIS — Z794 Long term (current) use of insulin: Secondary | ICD-10-CM | POA: Insufficient documentation

## 2019-03-16 DIAGNOSIS — Z79811 Long term (current) use of aromatase inhibitors: Secondary | ICD-10-CM | POA: Diagnosis not present

## 2019-03-16 DIAGNOSIS — R600 Localized edema: Secondary | ICD-10-CM | POA: Diagnosis not present

## 2019-03-16 DIAGNOSIS — E1122 Type 2 diabetes mellitus with diabetic chronic kidney disease: Secondary | ICD-10-CM | POA: Diagnosis not present

## 2019-03-16 DIAGNOSIS — N183 Chronic kidney disease, stage 3 (moderate): Secondary | ICD-10-CM | POA: Insufficient documentation

## 2019-03-16 DIAGNOSIS — E785 Hyperlipidemia, unspecified: Secondary | ICD-10-CM | POA: Insufficient documentation

## 2019-03-16 DIAGNOSIS — Z7982 Long term (current) use of aspirin: Secondary | ICD-10-CM | POA: Diagnosis not present

## 2019-03-16 DIAGNOSIS — N1832 Chronic kidney disease, stage 3b: Secondary | ICD-10-CM

## 2019-03-16 DIAGNOSIS — Z853 Personal history of malignant neoplasm of breast: Secondary | ICD-10-CM | POA: Insufficient documentation

## 2019-03-16 DIAGNOSIS — Z803 Family history of malignant neoplasm of breast: Secondary | ICD-10-CM | POA: Diagnosis not present

## 2019-03-16 DIAGNOSIS — Z9010 Acquired absence of unspecified breast and nipple: Secondary | ICD-10-CM | POA: Diagnosis not present

## 2019-03-16 DIAGNOSIS — M7989 Other specified soft tissue disorders: Secondary | ICD-10-CM | POA: Diagnosis not present

## 2019-03-16 DIAGNOSIS — Z801 Family history of malignant neoplasm of trachea, bronchus and lung: Secondary | ICD-10-CM | POA: Diagnosis not present

## 2019-03-16 DIAGNOSIS — Z79899 Other long term (current) drug therapy: Secondary | ICD-10-CM | POA: Diagnosis not present

## 2019-03-16 DIAGNOSIS — N189 Chronic kidney disease, unspecified: Secondary | ICD-10-CM | POA: Diagnosis not present

## 2019-03-16 LAB — CBC
HCT: 35.7 % — ABNORMAL LOW (ref 36.0–46.0)
Hemoglobin: 11.9 g/dL — ABNORMAL LOW (ref 12.0–15.0)
MCH: 29.9 pg (ref 26.0–34.0)
MCHC: 33.3 g/dL (ref 30.0–36.0)
MCV: 89.7 fL (ref 80.0–100.0)
Platelets: 223 10*3/uL (ref 150–400)
RBC: 3.98 MIL/uL (ref 3.87–5.11)
RDW: 13.2 % (ref 11.5–15.5)
WBC: 8.5 10*3/uL (ref 4.0–10.5)
nRBC: 0 % (ref 0.0–0.2)

## 2019-03-16 LAB — PROTIME-INR
INR: 1 (ref 0.8–1.2)
Prothrombin Time: 12.5 seconds (ref 11.4–15.2)

## 2019-03-16 LAB — GLUCOSE, CAPILLARY: Glucose-Capillary: 243 mg/dL — ABNORMAL HIGH (ref 70–99)

## 2019-03-16 MED ORDER — MIDAZOLAM HCL 2 MG/2ML IJ SOLN
INTRAMUSCULAR | Status: AC | PRN
  Administered 2019-03-16: 1 mg via INTRAVENOUS
  Administered 2019-03-16: 0.5 mg via INTRAVENOUS

## 2019-03-16 MED ORDER — SODIUM CHLORIDE 0.9 % IV SOLN: INTRAVENOUS | Status: DC

## 2019-03-16 MED ORDER — SODIUM CHLORIDE 0.9 % IV SOLN
INTRAVENOUS | Status: AC | PRN
  Administered 2019-03-16: 10 mL/h via INTRAVENOUS

## 2019-03-16 MED ORDER — HYDRALAZINE HCL 20 MG/ML IJ SOLN
INTRAMUSCULAR | Status: AC
  Filled 2019-03-16: qty 1

## 2019-03-16 MED ORDER — LIDOCAINE HCL (PF) 1 % IJ SOLN
INTRAMUSCULAR | Status: AC
  Filled 2019-03-16: qty 30

## 2019-03-16 MED ORDER — FENTANYL CITRATE (PF) 100 MCG/2ML IJ SOLN
INTRAMUSCULAR | Status: AC | PRN
  Administered 2019-03-16 (×2): 25 ug via INTRAVENOUS

## 2019-03-16 MED ORDER — HYDRALAZINE HCL 20 MG/ML IJ SOLN
INTRAMUSCULAR | Status: AC | PRN
  Administered 2019-03-16 (×2): 10 mg via INTRAVENOUS

## 2019-03-16 MED ORDER — FENTANYL CITRATE (PF) 100 MCG/2ML IJ SOLN
INTRAMUSCULAR | Status: AC
  Filled 2019-03-16: qty 2

## 2019-03-16 MED ORDER — MIDAZOLAM HCL 2 MG/2ML IJ SOLN
INTRAMUSCULAR | Status: AC
  Filled 2019-03-16: qty 2

## 2019-03-16 NOTE — H&P (Signed)
Chief Complaint: Patient was seen in consultation today for random renal biopsy at the request of [place]  Referring Physician(s): [place]  Supervising Physician: Aletta Edouard  Patient Status: [facility] - Out-pt  History of Present Illness: [name] is a 78 y.o. female   Hx Breast Ca CKD3; DM LE Edema-- seeing wound care for blisters Referred to Dr Hollie Salk Nephrotic range proteinuria  Scheduled for random renal bippsy  Past Medical History:  Diagnosis Date   Cancer ([place])    Diabetes mellitus    Hyperlipidemia     Past Surgical History:  Procedure Laterality Date   BREAST LUMPECTOMY  03/26/2011   right    Allergies: Patient has no known allergies.  Medications: Prior to Admission medications   Medication Sig Start Date End Date Taking? Authorizing Provider  amLODipine (NORVASC) 5 MG tablet Take 5 mg by mouth daily.   Yes [provider]  aspirin 325 MG tablet Take 325 mg by mouth daily.   Yes [provider]  atorvastatin (LIPITOR) 40 MG tablet Take 20 mg by mouth daily.    Yes [provider]  B Complex-C (SUPER B COMPLEX PO) Take 1 tablet by mouth every other day.    Yes [provider]  Calcium Carbonate (CALTRATE 600 PO) Take 1 tablet by mouth every other day.    Yes [provider]  Cholecalciferol (VITAMIN D) 2000 UNITS tablet Take 4,000 Units by mouth daily.   Yes [provider]  furosemide (LASIX) 40 MG tablet Take 40 mg by mouth 2 (two) times daily.    Yes [provider]  Insulin Degludec-Liraglutide (XULTOPHY) 100-3.6 UNIT-MG/ML SOPN Inject 36 Units into the skin every morning.    Yes [provider]  letrozole (FEMARA) 2.5 MG tablet Take 1 tablet (2.5 mg total) by mouth daily. 04/14/18  Yes Nicholas Lose, MD  metoprolol succinate (TOPROL-XL) 100 MG 24 hr tablet Take 100 mg by mouth daily.  08/16/14  Yes [provider]  olmesartan (BENICAR) 40 MG  tablet Take 40 mg by mouth daily.   Yes [provider]     Family History  Problem Relation Age of Onset   Cancer Father        bronchial   Cancer Paternal Aunt        breast   Cancer Paternal Aunt        breast    Social History   Socioeconomic History   Marital status: Married    Spouse name: Not on file   Number of children: Not on file   Years of education: Not on file   Highest education level: Not on file  Occupational History   Not on file  Social Needs   Financial resource strain: Not on file   Food insecurity    Worry: Not on file    Inability: Not on file   Transportation needs    Medical: Not on file    Non-medical: Not on file  Tobacco Use   Smoking status: Never Smoker   Smokeless tobacco: Never Used  Substance and Sexual Activity   Alcohol use: No   Drug use: No   Sexual activity: Not Currently  Lifestyle   Physical activity    Days per week: Not on file    Minutes per session: Not on file   Stress: Not on file  Relationships   Social connections    Talks on phone: Not on file    Gets together: Not on  file    Attends religious service: Not on file    Active member of club or organization: Not on file    Attends meetings of clubs or organizations: Not on file    Relationship status: Not on file  Other Topics Concern   Not on file  Social History Narrative   Not on file    Review of Systems: A 12 point ROS discussed and pertinent positives are indicated in the HPI above.  All other systems are negative.  Review of Systems  Constitutional: Positive for activity change. Negative for fatigue and fever.  Respiratory: Negative for cough and shortness of breath.   Cardiovascular: Positive for leg swelling. Negative for chest pain.  Gastrointestinal: Negative for abdominal pain.  Musculoskeletal: Negative for back pain.  Psychiatric/Behavioral: Negative for behavioral problems and confusion.    Vital Signs: BP  (!) 196/92    Pulse 79    Temp 97.8 F (36.6 C) (Oral)    Resp 16    Ht 5\' 4"  (1.626 m)    Wt 190 lb (86.2 kg)    SpO2 99%    BMI 32.61 kg/m   Physical Exam Vitals signs reviewed.  Cardiovascular:     Rate and Rhythm: Regular rhythm.     Heart sounds: Normal heart sounds.  Pulmonary:     Effort: Pulmonary effort is normal.     Breath sounds: Normal breath sounds. No wheezing.  Abdominal:     General: There is distension.     Palpations: Abdomen is soft.  Musculoskeletal: Normal range of motion.        General: Swelling present.     Right lower leg: Edema present.     Left lower leg: Edema present.  Skin:    General: Skin is warm and dry.  Neurological:     Mental Status: She is alert and oriented to person, place, and time.  Psychiatric:        Mood and Affect: Mood normal.        Behavior: Behavior normal.        Thought Content: Thought content normal.        Judgment: Judgment normal.     Imaging: Vas [place] Abi With/wo Tbi  Result Date: 02/26/2019 LOWER EXTREMITY DOPPLER STUDY Indications: Ulceration, and Rest pain with edema. High Risk Factors: Hypertension, hyperlipidemia, Diabetes, no history of                    smoking.  Limitations: Body habitus and bandages Performing Technologist: Burley Saver RVT  Examination Guidelines: A complete evaluation includes at minimum, Doppler waveform signals and systolic blood pressure reading at the level of bilateral brachial, anterior tibial, and posterior tibial arteries, when vessel segments are accessible. Bilateral testing is considered an integral part of a complete examination. Photoelectric Plethysmograph (PPG) waveforms and toe systolic pressure readings are included as required and additional duplex testing as needed. Limited examinations for reoccurring indications may be performed as noted.  ABI Findings: +---------+------------------+-----+----------+--------------------------------+  Right     Rt Pressure (mmHg) Index Waveform    Comment                           +---------+------------------+-----+----------+--------------------------------+  Brachial                                      restricted limb due to  right                                                     breast cancer                     +---------+------------------+-----+----------+--------------------------------+  PTA       196                1.17  monophasic                                   +---------+------------------+-----+----------+--------------------------------+  DP        223                1.34  monophasic                                   +---------+------------------+-----+----------+--------------------------------+  Great Toe 107                0.64                                               +---------+------------------+-----+----------+--------------------------------+ +---------+------------------+-----+----------+-------+  Left      Lt Pressure (mmHg) Index Waveform   Comment  +---------+------------------+-----+----------+-------+  Brachial  167                                          +---------+------------------+-----+----------+-------+  PTA       58                 0.35  monophasic          +---------+------------------+-----+----------+-------+  DP        162                0.97  monophasic          +---------+------------------+-----+----------+-------+  Great Toe 96                 0.57                      +---------+------------------+-----+----------+-------+ +-------+-----------+-----------+------------+------------+  ABI/TBI Today's ABI Today's TBI Previous ABI Previous TBI  +-------+-----------+-----------+------------+------------+  Right   1.34        0.64                                   +-------+-----------+-----------+------------+------------+  Left    0.97        0.57                                   +-------+-----------+-----------+------------+------------+ No previous study available for comparison.  Summary: Right: Resting  right ankle-brachial index indicates noncompressible right lower extremity arteries.The right toe-brachial index is abnormal. RT great toe pressure = 107 mmHg. Left: The left toe-brachial index is abnormal.  LT Great toe pressure = 96 mmHg. PPG tracings appear dampened. Although ankle brachial indices are within normal limits (0.95-1.29), arterial Doppler waveforms at the ankle suggest some component of arterial occlusive disease.  *See table(s) above for measurements and observations.  Electronically signed by Harold Barban MD on 02/26/2019 at 11:47:24 AM.    Final    Vas [place] Lower Extremity Venous Reflux  Result Date: 02/23/2019  Lower Venous Reflux Study Indications: Edema.  Performing Technologist: Ralene Cork RVT  Examination Guidelines: A complete evaluation includes B-mode imaging, spectral Doppler, color Doppler, and power Doppler as needed of all accessible portions of each vessel. Bilateral testing is considered an integral part of a complete examination. Limited examinations for reoccurring indications may be performed as noted. The reflux portion of the exam is performed with the patient in reverse Trendelenburg.  Venous Reflux Times Normal value < 0.5 sec +------------------------------+----------+---------+                                 Right (ms) Left (ms)  +------------------------------+----------+---------+  CFV                                       807.00     +------------------------------+----------+---------+  GSV at Saphenofemoral junction 851.00     2002.00    +------------------------------+----------+---------+  GSV prox thigh                            1394.00    +------------------------------+----------+---------+  GSV mid thigh                             1276.00    +------------------------------+----------+---------+  GSV dist thigh                            1240.00    +------------------------------+----------+---------+  GSV at knee                               763.00      +------------------------------+----------+---------+ +------------------------------+----------+---------+  VEIN DIAMETERS:                Right (cm) Left (cm)  +------------------------------+----------+---------+  GSV at Saphenofemoral junction 0.888      0.612      +------------------------------+----------+---------+  GSV at prox thigh              0.492      0.548      +------------------------------+----------+---------+  GSV at mid thigh               0.486      0.481      +------------------------------+----------+---------+  GSV at distal thigh            0.445      0.348      +------------------------------+----------+---------+  GSV at knee                    0.356      0.383      +------------------------------+----------+---------+  GSV prox calf                                        +------------------------------+----------+---------+  SSV origin                     NV         NV         +------------------------------+----------+---------+  SSV prox                       NV         NV         +------------------------------+----------+---------+  SSV mid                        NV         NV         +------------------------------+----------+---------+   Summary: Technically limited by body habitus and difficulty in positioning.  Right: Abnormal reflux times were noted in the great saphenous vein at the saphenofemoral junction. There is no evidence of deep vein thrombosis in the lower extremity.There is no evidence of superficial venous thrombosis.  Left: Abnormal reflux times were noted in the common femoral vein, great saphenous vein at the saphenofemoral junction, great saphenous vein at the proximal thigh, great saphenous vein at the mid thigh, great saphenous vein at the distal thigh, and great  saphenous vein at the knee. There is no evidence of deep vein thrombosis in the lower extremity. There is no evidence of superficial tvenous thrombosis.  *See table(s) above for measurements and  observations. Electronically signed by Servando Snare MD on 02/23/2019 at 2:28:50 PM.    Final     Labs:  CBC: No results for input(s): WBC, HGB, HCT, PLT in the last 8760 hours.  COAGS: No results for input(s): INR, APTT in the last 8760 hours.  BMP: No results for input(s): NA, K, CL, CO2, GLUCOSE, BUN, CALCIUM, CREATININE, GFRNONAA, GFRAA in the last 8760 hours.  Invalid input(s): CMP  LIVER FUNCTION TESTS: No results for input(s): BILITOT, AST, ALT, ALKPHOS, PROT, ALBUMIN in the last 8760 hours.  TUMOR MARKERS: No results for input(s): AFPTM, CEA, CA199, CHROMGRNA in the last 8760 hours.  Assessment and Plan:  LE edema Extremity swelling Proteinuria CKD3 Scheduled for random renal bx Risks and benefits of random renal bx was discussed with the patient and/or patient's family including, but not limited to bleeding, infection, damage to adjacent structures or low yield requiring additional tests.  All of the questions were answered and there is agreement to proceed. Consent signed and in chart.   Thank you for this interesting consult.  I greatly enjoyed meeting [name] and look forward to participating in their care.  A copy of this report was sent to the requesting provider on this date.  Electronically Signed: Lavonia Drafts, PA-C 03/16/2019, 6:43 AM   I spent a total of  30 Minutes   in face to face in clinical consultation, greater than 50% of which was counseling/coordinating care for random renal bx

## 2019-03-16 NOTE — Progress Notes (Signed)
Client states back pain decreased to 4/10 and states does not need pain medication at present

## 2019-03-16 NOTE — Progress Notes (Signed)
Client turned to back and sitting up in bed; c/o right side of back hurting 10/10 at times today due to positioning; states feels better sitting up; PamTurpin,PA notified of client c/o feeling like nose "stuffy" when lying on stomach and c/o right side of back hurting and client states back feels better with sitting up in bed

## 2019-03-16 NOTE — Discharge Instructions (Signed)

## 2019-03-16 NOTE — Progress Notes (Addendum)
Client c/o feeling like nose "stuffy"  while lying on back

## 2019-03-16 NOTE — Progress Notes (Signed)
Client up to bathroom with assistance and voided clear yellow urine

## 2019-03-19 DIAGNOSIS — E1169 Type 2 diabetes mellitus with other specified complication: Secondary | ICD-10-CM | POA: Diagnosis not present

## 2019-03-19 DIAGNOSIS — N184 Chronic kidney disease, stage 4 (severe): Secondary | ICD-10-CM | POA: Diagnosis not present

## 2019-03-19 DIAGNOSIS — Z6832 Body mass index (BMI) 32.0-32.9, adult: Secondary | ICD-10-CM | POA: Diagnosis not present

## 2019-03-19 DIAGNOSIS — E1122 Type 2 diabetes mellitus with diabetic chronic kidney disease: Secondary | ICD-10-CM | POA: Diagnosis not present

## 2019-03-19 DIAGNOSIS — E782 Mixed hyperlipidemia: Secondary | ICD-10-CM | POA: Diagnosis not present

## 2019-03-20 DIAGNOSIS — L97822 Non-pressure chronic ulcer of other part of left lower leg with fat layer exposed: Secondary | ICD-10-CM | POA: Diagnosis not present

## 2019-03-20 DIAGNOSIS — E11622 Type 2 diabetes mellitus with other skin ulcer: Secondary | ICD-10-CM | POA: Diagnosis not present

## 2019-03-26 ENCOUNTER — Encounter (HOSPITAL_COMMUNITY): Payer: Self-pay | Admitting: Nephrology

## 2019-03-29 DIAGNOSIS — D631 Anemia in chronic kidney disease: Secondary | ICD-10-CM | POA: Diagnosis not present

## 2019-03-29 DIAGNOSIS — N189 Chronic kidney disease, unspecified: Secondary | ICD-10-CM | POA: Diagnosis not present

## 2019-03-29 DIAGNOSIS — N183 Chronic kidney disease, stage 3 (moderate): Secondary | ICD-10-CM | POA: Diagnosis not present

## 2019-03-29 DIAGNOSIS — Z853 Personal history of malignant neoplasm of breast: Secondary | ICD-10-CM | POA: Diagnosis not present

## 2019-03-29 DIAGNOSIS — I129 Hypertensive chronic kidney disease with stage 1 through stage 4 chronic kidney disease, or unspecified chronic kidney disease: Secondary | ICD-10-CM | POA: Diagnosis not present

## 2019-03-29 DIAGNOSIS — E1122 Type 2 diabetes mellitus with diabetic chronic kidney disease: Secondary | ICD-10-CM | POA: Diagnosis not present

## 2019-04-03 DIAGNOSIS — E11622 Type 2 diabetes mellitus with other skin ulcer: Secondary | ICD-10-CM | POA: Diagnosis not present

## 2019-04-03 DIAGNOSIS — L97829 Non-pressure chronic ulcer of other part of left lower leg with unspecified severity: Secondary | ICD-10-CM | POA: Diagnosis not present

## 2019-04-03 DIAGNOSIS — I10 Essential (primary) hypertension: Secondary | ICD-10-CM | POA: Diagnosis not present

## 2019-04-05 DIAGNOSIS — I129 Hypertensive chronic kidney disease with stage 1 through stage 4 chronic kidney disease, or unspecified chronic kidney disease: Secondary | ICD-10-CM | POA: Diagnosis not present

## 2019-04-05 DIAGNOSIS — E1122 Type 2 diabetes mellitus with diabetic chronic kidney disease: Secondary | ICD-10-CM | POA: Diagnosis not present

## 2019-04-05 DIAGNOSIS — N183 Chronic kidney disease, stage 3 (moderate): Secondary | ICD-10-CM | POA: Diagnosis not present

## 2019-04-19 DIAGNOSIS — I129 Hypertensive chronic kidney disease with stage 1 through stage 4 chronic kidney disease, or unspecified chronic kidney disease: Secondary | ICD-10-CM | POA: Diagnosis not present

## 2019-04-19 DIAGNOSIS — N183 Chronic kidney disease, stage 3 (moderate): Secondary | ICD-10-CM | POA: Diagnosis not present

## 2019-04-19 DIAGNOSIS — E1122 Type 2 diabetes mellitus with diabetic chronic kidney disease: Secondary | ICD-10-CM | POA: Diagnosis not present

## 2019-04-19 DIAGNOSIS — D631 Anemia in chronic kidney disease: Secondary | ICD-10-CM | POA: Diagnosis not present

## 2019-04-19 DIAGNOSIS — N049 Nephrotic syndrome with unspecified morphologic changes: Secondary | ICD-10-CM | POA: Diagnosis not present

## 2019-04-19 NOTE — Progress Notes (Signed)
Patient Care Team: [name], MD as PCP - General (Family Medicine)  DIAGNOSIS:    ICD-10-CM   1. Lobular carcinoma of right breast ([place])  C50.911     SUMMARY OF ONCOLOGIC HISTORY: Oncology History  Lobular carcinoma of right breast ([place])  08/03/2010 Mammogram   3.4 cm area of density involving the skin and the axillary crease on the right breast.  An ultrasound was performed showing a 2 cm mass    08/19/2010 Initial Diagnosis   invasive mammary carcinoma with lobular features, ER 95%, PR 94%, Ki67 6%, HER2 negative at 0.85   08/25/2010 Breast MRI   8 x 4.1 x 4.0 cm mass in the upper outer quadrant of the right breast extending to the skin and with associated skin dimpling   08/26/2010 - 01/24/2011 Anti-estrogen oral therapy   Letrozole 2.5 mg daily for neoadjuvant therapy   01/24/2011 Breast MRI   complete resolution of abnormal enhancement in the right axillary tail compatible with tumor treatment response   03/26/2011 Surgery   R Lumpectomy; Inv lobular cancer 4.5 cm with tumor at anterior margin, 3/4 LN positive; Reexcision no additional tumor. Oncotype 18. DId not need chemotherapy    - 07/27/2011 Radiation Therapy   Radiation in [place]   09/02/2011 -  Anti-estrogen oral therapy   Letrozole 2.5 mg daily     CHIEF COMPLIANT: Follow-up on letrozole therapy  INTERVAL HISTORY: [name] is a 78 y.o. with above-mentioned history of right breast cancer treated with neoadjuvant letrozole, lumpectomy, radiation, and who is currently on letrozole. I last saw her a year ago. Mammogram on 07/06/18 showed no evidence of malignancy bilaterally. Bone density scan on 02/08/19 showed osteoporosis and she was started on Fosamax. She presents to the clinic today for annual follow-up.  Patient has had multiple health issues including a fracture in December 2019 of her right hip and she underwent screw placement and rehabilitation.  She also has multiple issues with her vision.  Her biggest  problem is with her kidneys and had a kidney biopsy which showed diabetic nephrosclerosis.  She is extremely concerned about the loss of kidney function.  This causes her to have leg swelling for which she takes Lasix.  She also takes potassium.    REVIEW OF SYSTEMS:   Constitutional: Denies fevers, chills or abnormal weight loss Eyes: Denies blurriness of vision Ears, nose, mouth, throat, and face: Denies mucositis or sore throat Respiratory: Denies cough, dyspnea or wheezes Cardiovascular: Denies palpitation, chest discomfort Gastrointestinal: Denies nausea, heartburn or change in bowel habits Skin: Denies abnormal skin rashes Lymphatics: Denies new lymphadenopathy or easy bruising Neurological: Generalized weakness Behavioral/Psych: Mood is stable, no new changes  Extremities: Lower extremity edema for which she is applying pressure dressing to keep the leg some swelling. Breast: denies any pain or lumps or nodules in either breasts All other systems were reviewed with the patient and are negative.  I have reviewed the past medical history, past surgical history, social history and family history with the patient and they are unchanged from previous note.  ALLERGIES:  has No Known Allergies.  MEDICATIONS:  Current Outpatient Medications  Medication Sig Dispense Refill  . amLODipine (NORVASC) 5 MG tablet Take 5 mg by mouth daily.    Marland Kitchen aspirin 325 MG tablet Take 325 mg by mouth daily.    Marland Kitchen atorvastatin (LIPITOR) 40 MG tablet Take 20 mg by mouth daily.     . B Complex-C (SUPER B COMPLEX PO) Take  1 tablet by mouth every other day.     . Calcium Carbonate (CALTRATE 600 PO) Take 1 tablet by mouth every other day.     . Cholecalciferol (VITAMIN D) 2000 UNITS tablet Take 4,000 Units by mouth daily.    . furosemide (LASIX) 40 MG tablet Take 40 mg by mouth 2 (two) times daily.     . Insulin Degludec-Liraglutide (XULTOPHY) 100-3.6 UNIT-MG/ML SOPN Inject 36 Units into the skin every morning.      Marland Kitchen letrozole (FEMARA) 2.5 MG tablet Take 1 tablet (2.5 mg total) by mouth daily. 90 tablet 3  . metoprolol succinate (TOPROL-XL) 100 MG 24 hr tablet Take 100 mg by mouth daily.     Marland Kitchen olmesartan (BENICAR) 40 MG tablet Take 40 mg by mouth daily.     No current facility-administered medications for this visit.     PHYSICAL EXAMINATION: ECOG PERFORMANCE STATUS: 1 - Symptomatic but completely ambulatory  Vitals:   04/20/19 0936  BP: (!) 203/98  Pulse: 92  Resp: 16  Temp: 98.5 F (36.9 C)  SpO2: 97%   Filed Weights   04/20/19 0936  Weight: 198 lb 12.8 oz (90.2 kg)    GENERAL: alert, no distress and comfortable SKIN: skin color, texture, turgor are normal, no rashes or significant lesions EYES: normal, Conjunctiva are pink and non-injected, sclera clear OROPHARYNX: no exudate, no erythema and lips, buccal mucosa, and tongue normal  NECK: supple, thyroid normal size, non-tender, without nodularity LYMPH: no palpable lymphadenopathy in the cervical, axillary or inguinal LUNGS: clear to auscultation and percussion with normal breathing effort HEART: regular rate & rhythm and no murmurs and no lower extremity edema ABDOMEN: abdomen soft, non-tender and normal bowel sounds MUSCULOSKELETAL: no cyanosis of digits and no clubbing  NEURO: alert & oriented x 3 with fluent speech, no focal motor/sensory deficits EXTREMITIES: 1+ lower extremity edema BREAST: No palpable masses or nodules in either right or left breasts. No palpable axillary supraclavicular or infraclavicular adenopathy no breast tenderness or nipple discharge. (exam performed in the presence of a chaperone)  LABORATORY DATA:  I have reviewed the data as listed CMP Latest Ref Rng & Units 04/14/2015 08/31/2011 06/01/2011  Glucose 70 - 140 mg/dl 499(H) 476(H) 372(H)  BUN 7.0 - 26.0 mg/dL 13.0 22 20  Creatinine 0.6 - 1.1 mg/dL 1.3(H) 1.23(H) 1.26(H)  Sodium 136 - 145 mEq/L 138 135 141  Potassium 3.5 - 5.1 mEq/L 4.0 3.7 4.3   Chloride 96 - 112 mEq/L - 97 102  CO2 22 - 29 mEq/L _0 Calcium 8.4 - 10.4 mg/dL 10.3 9.7 9.8  Total Protein 6.4 - 8.3 g/dL 7.1 6.8 6.9  Total Bilirubin 0.20 - 1.20 mg/dL 0.79 0.6 0.4  Alkaline Phos 40 - 150 U/L 139 106 90  AST 5 - 34 U/L _1 ALT 0 - 55 U/L _2 Lab Results  Component Value Date   WBC 8.5 03/16/2019   HGB 11.9 (L) 03/16/2019   HCT 35.7 (L) 03/16/2019   MCV 89.7 03/16/2019   PLT 223 03/16/2019   NEUTROABS 4.0 04/14/2015    ASSESSMENT & PLAN:  Lobular carcinoma of right breast Right breast invasive lobular carcinoma status post neoadjuvant hormonal therapy followed by lumpectomy and radiation therapy. Patient had extensive lymph node involvement 7/8 positive lymph nodes. At that time she Oncotype DX that revealed low risk of recurrence and she did not get chemotherapy.  Current treatment: Letrozole 2.5 mg daily since 2012  Because of her lobular breast cancer and because she had 7 positive lymph nodes and recommended extended adjuvant therapy beyond 5 years. Patient is interested in taking it for 10 years.  Letrozole toxicities: 1. Occasional hot flashes.  2. Osteopenia : Takes calcium with vitamin D being monitored with bone density  Breast cancer surveillance: 1. Breast exam 04/14/2018: No palpable lumps or nodules. Specifically changed he noticed of the cross the most is a pleasant Caucasian 2. Mammogram12/06/2018 at [facility]: Benign, breast density category B 3. Bonedensity7/16/2020: T score -0.9: Normal (previously it was -1.4 osteopenia) Because of her multiple fractures in the hips, she is currently on Boniva.  Kidney biopsy August 2020: Diabetic nephrosclerosis return to clinic in One year for follow-up   No orders of the defined types were placed in this encounter.  The patient has a good understanding of the overall plan. she agrees with it. she will call with any problems that may develop before the next visit here.   Nicholas Lose, MD 04/20/2019  Julious Oka Dorshimer am acting as scribe for Dr. Nicholas Lose.  I have reviewed the above documentation for accuracy and completeness, and I agree with the above.

## 2019-04-20 ENCOUNTER — Other Ambulatory Visit: Payer: Self-pay

## 2019-04-20 ENCOUNTER — Inpatient Hospital Stay: Payer: Medicare HMO | Attending: Hematology and Oncology | Admitting: Hematology and Oncology

## 2019-04-20 DIAGNOSIS — E1122 Type 2 diabetes mellitus with diabetic chronic kidney disease: Secondary | ICD-10-CM | POA: Diagnosis not present

## 2019-04-20 DIAGNOSIS — Z79899 Other long term (current) drug therapy: Secondary | ICD-10-CM | POA: Diagnosis not present

## 2019-04-20 DIAGNOSIS — Z170 Estrogen receptor positive status [ER+]: Secondary | ICD-10-CM | POA: Insufficient documentation

## 2019-04-20 DIAGNOSIS — I129 Hypertensive chronic kidney disease with stage 1 through stage 4 chronic kidney disease, or unspecified chronic kidney disease: Secondary | ICD-10-CM | POA: Diagnosis not present

## 2019-04-20 DIAGNOSIS — N189 Chronic kidney disease, unspecified: Secondary | ICD-10-CM | POA: Insufficient documentation

## 2019-04-20 DIAGNOSIS — C50911 Malignant neoplasm of unspecified site of right female breast: Secondary | ICD-10-CM | POA: Diagnosis not present

## 2019-04-20 DIAGNOSIS — Z79811 Long term (current) use of aromatase inhibitors: Secondary | ICD-10-CM | POA: Diagnosis not present

## 2019-04-20 DIAGNOSIS — C50411 Malignant neoplasm of upper-outer quadrant of right female breast: Secondary | ICD-10-CM | POA: Insufficient documentation

## 2019-04-20 MED ORDER — HYDRALAZINE HCL 25 MG PO TABS: 25.0000 mg | ORAL_TABLET | Freq: Two times a day (BID) | ORAL | Status: DC

## 2019-04-20 MED ORDER — LETROZOLE 2.5 MG PO TABS: 2.5000 mg | ORAL_TABLET | Freq: Every day | ORAL | 3 refills | Status: DC

## 2019-04-20 MED ORDER — POTASSIUM CHLORIDE CRYS ER 20 MEQ PO TBCR: 20.0000 meq | EXTENDED_RELEASE_TABLET | Freq: Two times a day (BID) | ORAL | Status: DC

## 2019-04-20 NOTE — Assessment & Plan Note (Addendum)
Right breast invasive lobular carcinoma status post neoadjuvant hormonal therapy followed by lumpectomy and radiation therapy. Patient had extensive lymph node involvement 7/8 positive lymph nodes. At that time she Oncotype DX that revealed low risk of recurrence and she did not get chemotherapy.  Current treatment: Letrozole 2.5 mg daily since 2012 Because of her lobular breast cancer and because she had 7 positive lymph nodes and recommended extended adjuvant therapy beyond 5 years. Patient is interested in taking it for 10 years.  Letrozole toxicities: 1. Occasional hot flashes.  2. Osteopenia : Takes calcium with vitamin D being monitored with bone density  Breast cancer surveillance: 1. Breast exam 04/14/2018: No palpable lumps or nodules. Specifically changed he noticed of the cross the most is a pleasant Caucasian 2. Mammogram12/06/2018 at [facility]: Benign, breast density category B 3. Bonedensity7/16/2020: T score -0.9: Normal (previously it was -1.4 osteopenia)  Kidney biopsy August 2020: Diabetic nephrosclerosis return to clinic in One year for follow-up

## 2019-04-23 ENCOUNTER — Telehealth: Payer: Self-pay | Admitting: Hematology and Oncology

## 2019-04-23 NOTE — Telephone Encounter (Signed)
I left a message regarding schedule  

## 2019-05-17 DIAGNOSIS — N189 Chronic kidney disease, unspecified: Secondary | ICD-10-CM | POA: Diagnosis not present

## 2019-05-17 DIAGNOSIS — D631 Anemia in chronic kidney disease: Secondary | ICD-10-CM | POA: Diagnosis not present

## 2019-05-17 DIAGNOSIS — N185 Chronic kidney disease, stage 5: Secondary | ICD-10-CM | POA: Diagnosis not present

## 2019-05-17 DIAGNOSIS — N1832 Chronic kidney disease, stage 3b: Secondary | ICD-10-CM | POA: Diagnosis not present

## 2019-05-17 DIAGNOSIS — N2581 Secondary hyperparathyroidism of renal origin: Secondary | ICD-10-CM | POA: Diagnosis not present

## 2019-05-17 DIAGNOSIS — I120 Hypertensive chronic kidney disease with stage 5 chronic kidney disease or end stage renal disease: Secondary | ICD-10-CM | POA: Diagnosis not present

## 2019-07-05 DIAGNOSIS — R300 Dysuria: Secondary | ICD-10-CM | POA: Diagnosis not present

## 2019-07-12 DIAGNOSIS — N185 Chronic kidney disease, stage 5: Secondary | ICD-10-CM | POA: Diagnosis not present

## 2019-07-12 DIAGNOSIS — Z853 Personal history of malignant neoplasm of breast: Secondary | ICD-10-CM | POA: Diagnosis not present

## 2019-07-12 DIAGNOSIS — D631 Anemia in chronic kidney disease: Secondary | ICD-10-CM | POA: Diagnosis not present

## 2019-07-12 DIAGNOSIS — I120 Hypertensive chronic kidney disease with stage 5 chronic kidney disease or end stage renal disease: Secondary | ICD-10-CM | POA: Diagnosis not present

## 2019-07-12 DIAGNOSIS — N2581 Secondary hyperparathyroidism of renal origin: Secondary | ICD-10-CM | POA: Diagnosis not present

## 2019-07-12 DIAGNOSIS — E1122 Type 2 diabetes mellitus with diabetic chronic kidney disease: Secondary | ICD-10-CM | POA: Diagnosis not present

## 2019-07-12 DIAGNOSIS — N189 Chronic kidney disease, unspecified: Secondary | ICD-10-CM | POA: Diagnosis not present

## 2019-08-16 DIAGNOSIS — E1122 Type 2 diabetes mellitus with diabetic chronic kidney disease: Secondary | ICD-10-CM | POA: Diagnosis not present

## 2019-08-16 DIAGNOSIS — D631 Anemia in chronic kidney disease: Secondary | ICD-10-CM | POA: Diagnosis not present

## 2019-08-16 DIAGNOSIS — N189 Chronic kidney disease, unspecified: Secondary | ICD-10-CM | POA: Diagnosis not present

## 2019-08-16 DIAGNOSIS — N2581 Secondary hyperparathyroidism of renal origin: Secondary | ICD-10-CM | POA: Diagnosis not present

## 2019-08-16 DIAGNOSIS — I120 Hypertensive chronic kidney disease with stage 5 chronic kidney disease or end stage renal disease: Secondary | ICD-10-CM | POA: Diagnosis not present

## 2019-08-16 DIAGNOSIS — N185 Chronic kidney disease, stage 5: Secondary | ICD-10-CM | POA: Diagnosis not present

## 2019-08-17 DIAGNOSIS — L299 Pruritus, unspecified: Secondary | ICD-10-CM | POA: Diagnosis not present

## 2019-08-17 DIAGNOSIS — E11620 Type 2 diabetes mellitus with diabetic dermatitis: Secondary | ICD-10-CM | POA: Diagnosis not present

## 2019-08-17 DIAGNOSIS — L853 Xerosis cutis: Secondary | ICD-10-CM | POA: Diagnosis not present

## 2019-09-19 DIAGNOSIS — I120 Hypertensive chronic kidney disease with stage 5 chronic kidney disease or end stage renal disease: Secondary | ICD-10-CM | POA: Diagnosis not present

## 2019-09-19 DIAGNOSIS — N189 Chronic kidney disease, unspecified: Secondary | ICD-10-CM | POA: Diagnosis not present

## 2019-09-19 DIAGNOSIS — N2581 Secondary hyperparathyroidism of renal origin: Secondary | ICD-10-CM | POA: Diagnosis not present

## 2019-09-19 DIAGNOSIS — E1122 Type 2 diabetes mellitus with diabetic chronic kidney disease: Secondary | ICD-10-CM | POA: Diagnosis not present

## 2019-09-19 DIAGNOSIS — N185 Chronic kidney disease, stage 5: Secondary | ICD-10-CM | POA: Diagnosis not present

## 2019-09-19 DIAGNOSIS — D631 Anemia in chronic kidney disease: Secondary | ICD-10-CM | POA: Diagnosis not present

## 2019-11-14 DIAGNOSIS — I120 Hypertensive chronic kidney disease with stage 5 chronic kidney disease or end stage renal disease: Secondary | ICD-10-CM | POA: Diagnosis not present

## 2019-11-14 DIAGNOSIS — D631 Anemia in chronic kidney disease: Secondary | ICD-10-CM | POA: Diagnosis not present

## 2019-11-14 DIAGNOSIS — N189 Chronic kidney disease, unspecified: Secondary | ICD-10-CM | POA: Diagnosis not present

## 2019-11-14 DIAGNOSIS — E1122 Type 2 diabetes mellitus with diabetic chronic kidney disease: Secondary | ICD-10-CM | POA: Diagnosis not present

## 2019-11-14 DIAGNOSIS — N185 Chronic kidney disease, stage 5: Secondary | ICD-10-CM | POA: Diagnosis not present

## 2019-11-14 DIAGNOSIS — N2581 Secondary hyperparathyroidism of renal origin: Secondary | ICD-10-CM | POA: Diagnosis not present

## 2019-11-19 DIAGNOSIS — E1169 Type 2 diabetes mellitus with other specified complication: Secondary | ICD-10-CM | POA: Diagnosis not present

## 2019-11-23 DIAGNOSIS — Z6831 Body mass index (BMI) 31.0-31.9, adult: Secondary | ICD-10-CM | POA: Diagnosis not present

## 2019-11-23 DIAGNOSIS — E113312 Type 2 diabetes mellitus with moderate nonproliferative diabetic retinopathy with macular edema, left eye: Secondary | ICD-10-CM | POA: Diagnosis not present

## 2019-11-23 DIAGNOSIS — E1165 Type 2 diabetes mellitus with hyperglycemia: Secondary | ICD-10-CM | POA: Diagnosis not present

## 2019-11-23 DIAGNOSIS — M810 Age-related osteoporosis without current pathological fracture: Secondary | ICD-10-CM | POA: Diagnosis not present

## 2019-11-23 DIAGNOSIS — N185 Chronic kidney disease, stage 5: Secondary | ICD-10-CM | POA: Diagnosis not present

## 2019-11-23 DIAGNOSIS — I10 Essential (primary) hypertension: Secondary | ICD-10-CM | POA: Diagnosis not present

## 2019-11-23 DIAGNOSIS — E1142 Type 2 diabetes mellitus with diabetic polyneuropathy: Secondary | ICD-10-CM | POA: Diagnosis not present

## 2019-11-23 DIAGNOSIS — I129 Hypertensive chronic kidney disease with stage 1 through stage 4 chronic kidney disease, or unspecified chronic kidney disease: Secondary | ICD-10-CM | POA: Diagnosis not present

## 2019-11-23 DIAGNOSIS — H6123 Impacted cerumen, bilateral: Secondary | ICD-10-CM | POA: Diagnosis not present

## 2019-12-24 DIAGNOSIS — E1122 Type 2 diabetes mellitus with diabetic chronic kidney disease: Secondary | ICD-10-CM | POA: Diagnosis not present

## 2019-12-24 DIAGNOSIS — I129 Hypertensive chronic kidney disease with stage 1 through stage 4 chronic kidney disease, or unspecified chronic kidney disease: Secondary | ICD-10-CM | POA: Diagnosis not present

## 2019-12-24 DIAGNOSIS — N185 Chronic kidney disease, stage 5: Secondary | ICD-10-CM | POA: Diagnosis not present

## 2019-12-24 DIAGNOSIS — E785 Hyperlipidemia, unspecified: Secondary | ICD-10-CM | POA: Diagnosis not present

## 2020-01-23 DIAGNOSIS — I120 Hypertensive chronic kidney disease with stage 5 chronic kidney disease or end stage renal disease: Secondary | ICD-10-CM | POA: Diagnosis not present

## 2020-01-23 DIAGNOSIS — N2581 Secondary hyperparathyroidism of renal origin: Secondary | ICD-10-CM | POA: Diagnosis not present

## 2020-01-23 DIAGNOSIS — D631 Anemia in chronic kidney disease: Secondary | ICD-10-CM | POA: Diagnosis not present

## 2020-01-23 DIAGNOSIS — N185 Chronic kidney disease, stage 5: Secondary | ICD-10-CM | POA: Diagnosis not present

## 2020-01-23 DIAGNOSIS — N189 Chronic kidney disease, unspecified: Secondary | ICD-10-CM | POA: Diagnosis not present

## 2020-01-23 DIAGNOSIS — E1122 Type 2 diabetes mellitus with diabetic chronic kidney disease: Secondary | ICD-10-CM | POA: Diagnosis not present

## 2020-02-01 DIAGNOSIS — N3091 Cystitis, unspecified with hematuria: Secondary | ICD-10-CM | POA: Diagnosis not present

## 2020-02-08 DIAGNOSIS — N185 Chronic kidney disease, stage 5: Secondary | ICD-10-CM | POA: Diagnosis not present

## 2020-02-08 DIAGNOSIS — N390 Urinary tract infection, site not specified: Secondary | ICD-10-CM | POA: Diagnosis not present

## 2020-02-08 DIAGNOSIS — E113513 Type 2 diabetes mellitus with proliferative diabetic retinopathy with macular edema, bilateral: Secondary | ICD-10-CM | POA: Diagnosis not present

## 2020-02-08 DIAGNOSIS — Z6830 Body mass index (BMI) 30.0-30.9, adult: Secondary | ICD-10-CM | POA: Diagnosis not present

## 2020-02-08 DIAGNOSIS — H3582 Retinal ischemia: Secondary | ICD-10-CM | POA: Diagnosis not present

## 2020-02-08 DIAGNOSIS — H3563 Retinal hemorrhage, bilateral: Secondary | ICD-10-CM | POA: Diagnosis not present

## 2020-02-09 DIAGNOSIS — Z1231 Encounter for screening mammogram for malignant neoplasm of breast: Secondary | ICD-10-CM | POA: Diagnosis not present

## 2020-02-13 DIAGNOSIS — D631 Anemia in chronic kidney disease: Secondary | ICD-10-CM | POA: Diagnosis not present

## 2020-02-13 DIAGNOSIS — N185 Chronic kidney disease, stage 5: Secondary | ICD-10-CM | POA: Diagnosis not present

## 2020-02-15 DIAGNOSIS — E113513 Type 2 diabetes mellitus with proliferative diabetic retinopathy with macular edema, bilateral: Secondary | ICD-10-CM | POA: Diagnosis not present

## 2020-03-12 DIAGNOSIS — D631 Anemia in chronic kidney disease: Secondary | ICD-10-CM | POA: Diagnosis not present

## 2020-03-12 DIAGNOSIS — N185 Chronic kidney disease, stage 5: Secondary | ICD-10-CM | POA: Diagnosis not present

## 2020-03-13 DIAGNOSIS — D631 Anemia in chronic kidney disease: Secondary | ICD-10-CM | POA: Diagnosis not present

## 2020-03-13 DIAGNOSIS — N185 Chronic kidney disease, stage 5: Secondary | ICD-10-CM | POA: Diagnosis not present

## 2020-03-13 DIAGNOSIS — N2581 Secondary hyperparathyroidism of renal origin: Secondary | ICD-10-CM | POA: Diagnosis not present

## 2020-03-13 DIAGNOSIS — E1122 Type 2 diabetes mellitus with diabetic chronic kidney disease: Secondary | ICD-10-CM | POA: Diagnosis not present

## 2020-03-13 DIAGNOSIS — I120 Hypertensive chronic kidney disease with stage 5 chronic kidney disease or end stage renal disease: Secondary | ICD-10-CM | POA: Diagnosis not present

## 2020-03-25 DIAGNOSIS — I129 Hypertensive chronic kidney disease with stage 1 through stage 4 chronic kidney disease, or unspecified chronic kidney disease: Secondary | ICD-10-CM | POA: Diagnosis not present

## 2020-03-25 DIAGNOSIS — I10 Essential (primary) hypertension: Secondary | ICD-10-CM | POA: Diagnosis not present

## 2020-03-25 DIAGNOSIS — E1169 Type 2 diabetes mellitus with other specified complication: Secondary | ICD-10-CM | POA: Diagnosis not present

## 2020-03-26 DIAGNOSIS — N185 Chronic kidney disease, stage 5: Secondary | ICD-10-CM | POA: Diagnosis not present

## 2020-03-26 DIAGNOSIS — D631 Anemia in chronic kidney disease: Secondary | ICD-10-CM | POA: Diagnosis not present

## 2020-03-27 DIAGNOSIS — D631 Anemia in chronic kidney disease: Secondary | ICD-10-CM | POA: Diagnosis not present

## 2020-03-27 DIAGNOSIS — N189 Chronic kidney disease, unspecified: Secondary | ICD-10-CM | POA: Diagnosis not present

## 2020-04-03 DIAGNOSIS — N189 Chronic kidney disease, unspecified: Secondary | ICD-10-CM | POA: Diagnosis not present

## 2020-04-03 DIAGNOSIS — D631 Anemia in chronic kidney disease: Secondary | ICD-10-CM | POA: Diagnosis not present

## 2020-04-09 DIAGNOSIS — D631 Anemia in chronic kidney disease: Secondary | ICD-10-CM | POA: Diagnosis not present

## 2020-04-09 DIAGNOSIS — N185 Chronic kidney disease, stage 5: Secondary | ICD-10-CM | POA: Diagnosis not present

## 2020-04-16 DIAGNOSIS — N189 Chronic kidney disease, unspecified: Secondary | ICD-10-CM | POA: Diagnosis not present

## 2020-04-16 DIAGNOSIS — D631 Anemia in chronic kidney disease: Secondary | ICD-10-CM | POA: Diagnosis not present

## 2020-04-16 DIAGNOSIS — N185 Chronic kidney disease, stage 5: Secondary | ICD-10-CM | POA: Diagnosis not present

## 2020-04-16 DIAGNOSIS — N2581 Secondary hyperparathyroidism of renal origin: Secondary | ICD-10-CM | POA: Diagnosis not present

## 2020-04-16 DIAGNOSIS — E1122 Type 2 diabetes mellitus with diabetic chronic kidney disease: Secondary | ICD-10-CM | POA: Diagnosis not present

## 2020-04-17 DIAGNOSIS — D631 Anemia in chronic kidney disease: Secondary | ICD-10-CM | POA: Diagnosis not present

## 2020-04-17 DIAGNOSIS — N189 Chronic kidney disease, unspecified: Secondary | ICD-10-CM | POA: Diagnosis not present

## 2020-04-18 DIAGNOSIS — D631 Anemia in chronic kidney disease: Secondary | ICD-10-CM | POA: Diagnosis not present

## 2020-04-18 DIAGNOSIS — N189 Chronic kidney disease, unspecified: Secondary | ICD-10-CM | POA: Diagnosis not present

## 2020-04-20 NOTE — Progress Notes (Signed)
Patient Care Team: [name], MD as PCP - General (Family Medicine)  DIAGNOSIS:    ICD-10-CM   1. Lobular carcinoma of right breast ([place])  C50.911     SUMMARY OF ONCOLOGIC HISTORY: Oncology History  Lobular carcinoma of right breast ([place])  08/03/2010 Mammogram   3.4 cm area of density involving the skin and the axillary crease on the right breast.  An ultrasound was performed showing a 2 cm mass    08/19/2010 Initial Diagnosis   invasive mammary carcinoma with lobular features, ER 95%, PR 94%, Ki67 6%, HER2 negative at 0.85   08/25/2010 Breast MRI   8 x 4.1 x 4.0 cm mass in the upper outer quadrant of the right breast extending to the skin and with associated skin dimpling   08/26/2010 - 01/24/2011 Anti-estrogen oral therapy   Letrozole 2.5 mg daily for neoadjuvant therapy   01/24/2011 Breast MRI   complete resolution of abnormal enhancement in the right axillary tail compatible with tumor treatment response   03/26/2011 Surgery   R Lumpectomy; Inv lobular cancer 4.5 cm with tumor at anterior margin, 3/4 LN positive; Reexcision no additional tumor. Oncotype 18. DId not need chemotherapy    - 07/27/2011 Radiation Therapy   Radiation in [place]   09/02/2011 -  Anti-estrogen oral therapy   Letrozole 2.5 mg daily     CHIEF COMPLIANT: Follow-up of right breast cancer on letrozole therapy  INTERVAL HISTORY: [name] is a 79 y.o. with above-mentioned history of right breast cancer treated with neoadjuvant letrozole, lumpectomy, radiation, and who is currently on letrozole. She presents to the clinic today for annual follow-up.   She denies any lumps or nodules in the breast.  This past year she was found to be anemic and past to Friday she received blood transfusion and previously she received IV iron.  Her stools are black in color and they are runny and almost semiincontinent.   ALLERGIES:  has No Known Allergies.  MEDICATIONS:  Current Outpatient Medications  Medication Sig  Dispense Refill  . amLODipine (NORVASC) 5 MG tablet Take 5 mg by mouth daily.    Marland Kitchen aspirin 325 MG tablet Take 325 mg by mouth daily.    Marland Kitchen atorvastatin (LIPITOR) 40 MG tablet Take 20 mg by mouth daily.     . B Complex-C (SUPER B COMPLEX PO) Take 1 tablet by mouth every other day.     . Calcium Carbonate (CALTRATE 600 PO) Take 1 tablet by mouth every other day.     . Cholecalciferol (VITAMIN D) 2000 UNITS tablet Take 4,000 Units by mouth daily.    . furosemide (LASIX) 40 MG tablet Take 40 mg by mouth 2 (two) times daily.     . hydrALAZINE (APRESOLINE) 25 MG tablet Take 1 tablet (25 mg total) by mouth 2 (two) times daily.    . Insulin Degludec-Liraglutide (XULTOPHY) 100-3.6 UNIT-MG/ML SOPN Inject 36 Units into the skin every morning.     Marland Kitchen letrozole (FEMARA) 2.5 MG tablet Take 1 tablet (2.5 mg total) by mouth daily. 90 tablet 3  . metoprolol succinate (TOPROL-XL) 100 MG 24 hr tablet Take 100 mg by mouth daily.     . potassium chloride SA (K-DUR) 20 MEQ tablet Take 1 tablet (20 mEq total) by mouth 2 (two) times daily.     No current facility-administered medications for this visit.    PHYSICAL EXAMINATION: ECOG PERFORMANCE STATUS: 1 - Symptomatic but completely ambulatory  Vitals:   04/21/20 1022  BP: (!) 145/77  Pulse: 88  Resp: 18  Temp: 98.2 F (36.8 C)  SpO2: 97%   Filed Weights   04/21/20 1022  Weight: 171 lb 12.8 oz (77.9 kg)    BREAST: No palpable masses or nodules in either right or left breasts. No palpable axillary supraclavicular or infraclavicular adenopathy no breast tenderness or nipple discharge. (exam performed in the presence of a chaperone)  LABORATORY DATA:  I have reviewed the data as listed CMP Latest Ref Rng & Units 04/14/2015 08/31/2011 06/01/2011  Glucose 70 - 140 mg/dl 499(H) 476(H) 372(H)  BUN 7.0 - 26.0 mg/dL 13.0 22 20  Creatinine 0.6 - 1.1 mg/dL 1.3(H) 1.23(H) 1.26(H)  Sodium 136 - 145 mEq/L 138 135 141  Potassium 3.5 - 5.1 mEq/L 4.0 3.7 4.3  Chloride  96 - 112 mEq/L - 97 102  CO2 22 - 29 mEq/L _0 Calcium 8.4 - 10.4 mg/dL 10.3 9.7 9.8  Total Protein 6.4 - 8.3 g/dL 7.1 6.8 6.9  Total Bilirubin 0.20 - 1.20 mg/dL 0.79 0.6 0.4  Alkaline Phos 40 - 150 U/L 139 106 90  AST 5 - 34 U/L _1 ALT 0 - 55 U/L _2 Lab Results  Component Value Date   WBC 8.5 03/16/2019   HGB 11.9 (L) 03/16/2019   HCT 35.7 (L) 03/16/2019   MCV 89.7 03/16/2019   PLT 223 03/16/2019   NEUTROABS 4.0 04/14/2015    ASSESSMENT & PLAN:  Lobular carcinoma of right breast Right breast invasive lobular carcinoma status post neoadjuvant hormonal therapy followed by lumpectomy and radiation therapy. Patient had extensive lymph node involvement 7/8 positive lymph nodes. At that time she Oncotype DX that revealed low risk of recurrence and she did not get chemotherapy.  Current treatment: Letrozole 2.5 mg daily since 2012 Because of her lobular breast cancer and because she had 7 positive lymph nodes and recommended extended adjuvant therapy beyond 5 years. Patient is interested in taking it for 10 years.  She is already asking if she can continue beyond 10 years.  She will finish 10 years x 2022.  Letrozole toxicities: 1. Occasional hot flashes.  2. Osteopenia : Takes calcium with vitamin D being monitored with bone density Bone density 02/08/2019: T score -0.9: Normal  Breast cancer surveillance: 1. Breast exam 04/21/2020 No palpable lumps or nodules.  2. Mammogram12/06/2018 at [facility]: Benign, breast density category B 3. Bonedensity7/16/2020: T score -0.9: Normal (previously it was -1.4 osteopenia) Because of her multiple fractures in the hips, she is currently on Boniva.  Kidney biopsy August 2020: Diabetic nephrosclerosis She thinks that she is going to undergo dialysis very soon.  Severe anemia: Dr. Hollie Salk has been managing her anemia.  She received blood transfusion last Friday.  She has received IV iron.  She tells me that her bowels  are black in color and a gastroenterology consultation was requested. I did not order any blood work today because she is being actively managed by Dr. Hollie Salk.  return to clinic in One year for follow-up    No orders of the defined types were placed in this encounter.  The patient has a good understanding of the overall plan. she agrees with it. she will call with any problems that may develop before the next visit here.  Total time spent: 30 mins including face to face time and time spent for planning, charting and coordination of care  Nicholas Lose, MD 04/21/2020  I, Cloyde Reams  [place], am acting as scribe for Dr. Nicholas Lose.  I have reviewed the above documentation for accuracy and completeness, and I agree with the above.

## 2020-04-21 ENCOUNTER — Telehealth: Payer: Self-pay | Admitting: Hematology and Oncology

## 2020-04-21 ENCOUNTER — Inpatient Hospital Stay: Payer: Medicare HMO | Attending: Hematology and Oncology | Admitting: Hematology and Oncology

## 2020-04-21 ENCOUNTER — Other Ambulatory Visit: Payer: Self-pay

## 2020-04-21 DIAGNOSIS — Z79811 Long term (current) use of aromatase inhibitors: Secondary | ICD-10-CM | POA: Insufficient documentation

## 2020-04-21 DIAGNOSIS — Z170 Estrogen receptor positive status [ER+]: Secondary | ICD-10-CM | POA: Insufficient documentation

## 2020-04-21 DIAGNOSIS — M8580 Other specified disorders of bone density and structure, unspecified site: Secondary | ICD-10-CM | POA: Diagnosis not present

## 2020-04-21 DIAGNOSIS — C50911 Malignant neoplasm of unspecified site of right female breast: Secondary | ICD-10-CM | POA: Insufficient documentation

## 2020-04-21 DIAGNOSIS — Z79899 Other long term (current) drug therapy: Secondary | ICD-10-CM | POA: Insufficient documentation

## 2020-04-21 DIAGNOSIS — E1122 Type 2 diabetes mellitus with diabetic chronic kidney disease: Secondary | ICD-10-CM | POA: Diagnosis not present

## 2020-04-21 DIAGNOSIS — N189 Chronic kidney disease, unspecified: Secondary | ICD-10-CM | POA: Diagnosis not present

## 2020-04-21 DIAGNOSIS — Z794 Long term (current) use of insulin: Secondary | ICD-10-CM | POA: Diagnosis not present

## 2020-04-21 MED ORDER — LETROZOLE 2.5 MG PO TABS: 2.5000 mg | ORAL_TABLET | Freq: Every day | ORAL | 3 refills | Status: DC

## 2020-04-21 NOTE — Assessment & Plan Note (Signed)
Right breast invasive lobular carcinoma status post neoadjuvant hormonal therapy followed by lumpectomy and radiation therapy. Patient had extensive lymph node involvement 7/8 positive lymph nodes. At that time she Oncotype DX that revealed low risk of recurrence and she did not get chemotherapy.  Current treatment: Letrozole 2.5 mg daily since 2012 Because of her lobular breast cancer and because she had 7 positive lymph nodes and recommended extended adjuvant therapy beyond 5 years. Patient is interested in taking it for 10 years.  Letrozole toxicities: 1. Occasional hot flashes.  2. Osteopenia : Takes calcium with vitamin D being monitored with bone density  Breast cancer surveillance: 1. Breast exam 04/21/2020 No palpable lumps or nodules.  2. Mammogram12/06/2018 at [facility]: Benign, breast density category B 3. Bonedensity7/16/2020: T score -0.9: Normal (previously it was -1.4 osteopenia) Because of her multiple fractures in the hips, she is currently on Boniva.  Kidney biopsy August 2020: Diabetic nephrosclerosis return to clinic in One year for follow-up

## 2020-04-21 NOTE — Telephone Encounter (Signed)
Scheduled appts per 9/27 los. Gave pt a print out of AVS.

## 2020-04-23 ENCOUNTER — Encounter: Payer: Self-pay | Admitting: Hematology and Oncology

## 2020-04-23 DIAGNOSIS — D631 Anemia in chronic kidney disease: Secondary | ICD-10-CM | POA: Diagnosis not present

## 2020-04-23 DIAGNOSIS — N185 Chronic kidney disease, stage 5: Secondary | ICD-10-CM | POA: Diagnosis not present

## 2020-04-25 DIAGNOSIS — N186 End stage renal disease: Secondary | ICD-10-CM | POA: Diagnosis not present

## 2020-04-25 DIAGNOSIS — Z992 Dependence on renal dialysis: Secondary | ICD-10-CM | POA: Diagnosis not present

## 2020-05-01 DIAGNOSIS — N2581 Secondary hyperparathyroidism of renal origin: Secondary | ICD-10-CM | POA: Diagnosis not present

## 2020-05-01 DIAGNOSIS — N186 End stage renal disease: Secondary | ICD-10-CM | POA: Diagnosis not present

## 2020-05-01 DIAGNOSIS — Z992 Dependence on renal dialysis: Secondary | ICD-10-CM | POA: Diagnosis not present

## 2020-05-03 DIAGNOSIS — N186 End stage renal disease: Secondary | ICD-10-CM | POA: Diagnosis not present

## 2020-05-03 DIAGNOSIS — Z992 Dependence on renal dialysis: Secondary | ICD-10-CM | POA: Diagnosis not present

## 2020-05-03 DIAGNOSIS — N2581 Secondary hyperparathyroidism of renal origin: Secondary | ICD-10-CM | POA: Diagnosis not present

## 2020-05-06 DIAGNOSIS — Z992 Dependence on renal dialysis: Secondary | ICD-10-CM | POA: Diagnosis not present

## 2020-05-06 DIAGNOSIS — N2581 Secondary hyperparathyroidism of renal origin: Secondary | ICD-10-CM | POA: Diagnosis not present

## 2020-05-06 DIAGNOSIS — N186 End stage renal disease: Secondary | ICD-10-CM | POA: Diagnosis not present

## 2020-05-08 DIAGNOSIS — N2581 Secondary hyperparathyroidism of renal origin: Secondary | ICD-10-CM | POA: Diagnosis not present

## 2020-05-08 DIAGNOSIS — N186 End stage renal disease: Secondary | ICD-10-CM | POA: Diagnosis not present

## 2020-05-08 DIAGNOSIS — Z992 Dependence on renal dialysis: Secondary | ICD-10-CM | POA: Diagnosis not present

## 2020-05-09 DIAGNOSIS — N186 End stage renal disease: Secondary | ICD-10-CM | POA: Diagnosis not present

## 2020-05-09 DIAGNOSIS — N2581 Secondary hyperparathyroidism of renal origin: Secondary | ICD-10-CM | POA: Diagnosis not present

## 2020-05-09 DIAGNOSIS — Z992 Dependence on renal dialysis: Secondary | ICD-10-CM | POA: Diagnosis not present

## 2020-05-12 DIAGNOSIS — N2581 Secondary hyperparathyroidism of renal origin: Secondary | ICD-10-CM | POA: Diagnosis not present

## 2020-05-12 DIAGNOSIS — Z992 Dependence on renal dialysis: Secondary | ICD-10-CM | POA: Diagnosis not present

## 2020-05-12 DIAGNOSIS — N186 End stage renal disease: Secondary | ICD-10-CM | POA: Diagnosis not present

## 2020-05-14 DIAGNOSIS — N186 End stage renal disease: Secondary | ICD-10-CM | POA: Diagnosis not present

## 2020-05-14 DIAGNOSIS — N2581 Secondary hyperparathyroidism of renal origin: Secondary | ICD-10-CM | POA: Diagnosis not present

## 2020-05-14 DIAGNOSIS — Z992 Dependence on renal dialysis: Secondary | ICD-10-CM | POA: Diagnosis not present

## 2020-05-16 DIAGNOSIS — N186 End stage renal disease: Secondary | ICD-10-CM | POA: Diagnosis not present

## 2020-05-16 DIAGNOSIS — N2581 Secondary hyperparathyroidism of renal origin: Secondary | ICD-10-CM | POA: Diagnosis not present

## 2020-05-16 DIAGNOSIS — Z992 Dependence on renal dialysis: Secondary | ICD-10-CM | POA: Diagnosis not present

## 2020-05-19 DIAGNOSIS — N2581 Secondary hyperparathyroidism of renal origin: Secondary | ICD-10-CM | POA: Diagnosis not present

## 2020-05-19 DIAGNOSIS — N186 End stage renal disease: Secondary | ICD-10-CM | POA: Diagnosis not present

## 2020-05-19 DIAGNOSIS — Z992 Dependence on renal dialysis: Secondary | ICD-10-CM | POA: Diagnosis not present

## 2020-05-20 ENCOUNTER — Other Ambulatory Visit: Payer: Self-pay

## 2020-05-20 DIAGNOSIS — N183 Chronic kidney disease, stage 3 unspecified: Secondary | ICD-10-CM

## 2020-05-21 DIAGNOSIS — Z992 Dependence on renal dialysis: Secondary | ICD-10-CM | POA: Diagnosis not present

## 2020-05-21 DIAGNOSIS — N186 End stage renal disease: Secondary | ICD-10-CM | POA: Diagnosis not present

## 2020-05-21 DIAGNOSIS — N2581 Secondary hyperparathyroidism of renal origin: Secondary | ICD-10-CM | POA: Diagnosis not present

## 2020-05-23 DIAGNOSIS — Z992 Dependence on renal dialysis: Secondary | ICD-10-CM | POA: Diagnosis not present

## 2020-05-23 DIAGNOSIS — N2581 Secondary hyperparathyroidism of renal origin: Secondary | ICD-10-CM | POA: Diagnosis not present

## 2020-05-23 DIAGNOSIS — N186 End stage renal disease: Secondary | ICD-10-CM | POA: Diagnosis not present

## 2020-05-24 DIAGNOSIS — E1122 Type 2 diabetes mellitus with diabetic chronic kidney disease: Secondary | ICD-10-CM | POA: Diagnosis not present

## 2020-05-24 DIAGNOSIS — N185 Chronic kidney disease, stage 5: Secondary | ICD-10-CM | POA: Diagnosis not present

## 2020-05-24 DIAGNOSIS — I129 Hypertensive chronic kidney disease with stage 1 through stage 4 chronic kidney disease, or unspecified chronic kidney disease: Secondary | ICD-10-CM | POA: Diagnosis not present

## 2020-05-24 DIAGNOSIS — E785 Hyperlipidemia, unspecified: Secondary | ICD-10-CM | POA: Diagnosis not present

## 2020-05-25 DIAGNOSIS — E1122 Type 2 diabetes mellitus with diabetic chronic kidney disease: Secondary | ICD-10-CM | POA: Diagnosis not present

## 2020-05-25 DIAGNOSIS — N186 End stage renal disease: Secondary | ICD-10-CM | POA: Diagnosis not present

## 2020-05-25 DIAGNOSIS — Z992 Dependence on renal dialysis: Secondary | ICD-10-CM | POA: Diagnosis not present

## 2020-05-26 DIAGNOSIS — N186 End stage renal disease: Secondary | ICD-10-CM | POA: Diagnosis not present

## 2020-05-26 DIAGNOSIS — N2581 Secondary hyperparathyroidism of renal origin: Secondary | ICD-10-CM | POA: Diagnosis not present

## 2020-05-26 DIAGNOSIS — Z992 Dependence on renal dialysis: Secondary | ICD-10-CM | POA: Diagnosis not present

## 2020-05-28 DIAGNOSIS — N186 End stage renal disease: Secondary | ICD-10-CM | POA: Diagnosis not present

## 2020-05-28 DIAGNOSIS — N2581 Secondary hyperparathyroidism of renal origin: Secondary | ICD-10-CM | POA: Diagnosis not present

## 2020-05-28 DIAGNOSIS — Z992 Dependence on renal dialysis: Secondary | ICD-10-CM | POA: Diagnosis not present

## 2020-05-30 DIAGNOSIS — N2581 Secondary hyperparathyroidism of renal origin: Secondary | ICD-10-CM | POA: Diagnosis not present

## 2020-05-30 DIAGNOSIS — Z992 Dependence on renal dialysis: Secondary | ICD-10-CM | POA: Diagnosis not present

## 2020-05-30 DIAGNOSIS — N186 End stage renal disease: Secondary | ICD-10-CM | POA: Diagnosis not present

## 2020-06-02 DIAGNOSIS — Z992 Dependence on renal dialysis: Secondary | ICD-10-CM | POA: Diagnosis not present

## 2020-06-02 DIAGNOSIS — N2581 Secondary hyperparathyroidism of renal origin: Secondary | ICD-10-CM | POA: Diagnosis not present

## 2020-06-02 DIAGNOSIS — N186 End stage renal disease: Secondary | ICD-10-CM | POA: Diagnosis not present

## 2020-06-03 DIAGNOSIS — E113512 Type 2 diabetes mellitus with proliferative diabetic retinopathy with macular edema, left eye: Secondary | ICD-10-CM | POA: Diagnosis not present

## 2020-06-03 DIAGNOSIS — E113591 Type 2 diabetes mellitus with proliferative diabetic retinopathy without macular edema, right eye: Secondary | ICD-10-CM | POA: Diagnosis not present

## 2020-06-03 DIAGNOSIS — H3582 Retinal ischemia: Secondary | ICD-10-CM | POA: Diagnosis not present

## 2020-06-04 ENCOUNTER — Encounter: Payer: Medicare HMO | Admitting: Vascular Surgery

## 2020-06-04 ENCOUNTER — Inpatient Hospital Stay (HOSPITAL_COMMUNITY): Admission: RE | Admit: 2020-06-04 | Payer: Medicare HMO | Source: Ambulatory Visit

## 2020-06-04 ENCOUNTER — Encounter (HOSPITAL_COMMUNITY): Payer: Medicare HMO

## 2020-06-04 DIAGNOSIS — Z992 Dependence on renal dialysis: Secondary | ICD-10-CM | POA: Diagnosis not present

## 2020-06-04 DIAGNOSIS — N186 End stage renal disease: Secondary | ICD-10-CM | POA: Diagnosis not present

## 2020-06-04 DIAGNOSIS — N2581 Secondary hyperparathyroidism of renal origin: Secondary | ICD-10-CM | POA: Diagnosis not present

## 2020-06-06 DIAGNOSIS — N186 End stage renal disease: Secondary | ICD-10-CM | POA: Diagnosis not present

## 2020-06-06 DIAGNOSIS — N2581 Secondary hyperparathyroidism of renal origin: Secondary | ICD-10-CM | POA: Diagnosis not present

## 2020-06-06 DIAGNOSIS — Z992 Dependence on renal dialysis: Secondary | ICD-10-CM | POA: Diagnosis not present

## 2020-06-09 DIAGNOSIS — N2581 Secondary hyperparathyroidism of renal origin: Secondary | ICD-10-CM | POA: Diagnosis not present

## 2020-06-09 DIAGNOSIS — Z992 Dependence on renal dialysis: Secondary | ICD-10-CM | POA: Diagnosis not present

## 2020-06-09 DIAGNOSIS — N186 End stage renal disease: Secondary | ICD-10-CM | POA: Diagnosis not present

## 2020-06-11 DIAGNOSIS — N2581 Secondary hyperparathyroidism of renal origin: Secondary | ICD-10-CM | POA: Diagnosis not present

## 2020-06-11 DIAGNOSIS — Z992 Dependence on renal dialysis: Secondary | ICD-10-CM | POA: Diagnosis not present

## 2020-06-11 DIAGNOSIS — N186 End stage renal disease: Secondary | ICD-10-CM | POA: Diagnosis not present

## 2020-06-13 DIAGNOSIS — N186 End stage renal disease: Secondary | ICD-10-CM | POA: Diagnosis not present

## 2020-06-13 DIAGNOSIS — Z992 Dependence on renal dialysis: Secondary | ICD-10-CM | POA: Diagnosis not present

## 2020-06-13 DIAGNOSIS — N2581 Secondary hyperparathyroidism of renal origin: Secondary | ICD-10-CM | POA: Diagnosis not present

## 2020-06-15 DIAGNOSIS — N186 End stage renal disease: Secondary | ICD-10-CM | POA: Diagnosis not present

## 2020-06-15 DIAGNOSIS — Z992 Dependence on renal dialysis: Secondary | ICD-10-CM | POA: Diagnosis not present

## 2020-06-15 DIAGNOSIS — N2581 Secondary hyperparathyroidism of renal origin: Secondary | ICD-10-CM | POA: Diagnosis not present

## 2020-06-17 DIAGNOSIS — N186 End stage renal disease: Secondary | ICD-10-CM | POA: Diagnosis not present

## 2020-06-17 DIAGNOSIS — Z992 Dependence on renal dialysis: Secondary | ICD-10-CM | POA: Diagnosis not present

## 2020-06-17 DIAGNOSIS — N2581 Secondary hyperparathyroidism of renal origin: Secondary | ICD-10-CM | POA: Diagnosis not present

## 2020-06-20 DIAGNOSIS — N186 End stage renal disease: Secondary | ICD-10-CM | POA: Diagnosis not present

## 2020-06-20 DIAGNOSIS — Z992 Dependence on renal dialysis: Secondary | ICD-10-CM | POA: Diagnosis not present

## 2020-06-20 DIAGNOSIS — N2581 Secondary hyperparathyroidism of renal origin: Secondary | ICD-10-CM | POA: Diagnosis not present

## 2020-06-23 DIAGNOSIS — N186 End stage renal disease: Secondary | ICD-10-CM | POA: Diagnosis not present

## 2020-06-23 DIAGNOSIS — Z992 Dependence on renal dialysis: Secondary | ICD-10-CM | POA: Diagnosis not present

## 2020-06-23 DIAGNOSIS — N2581 Secondary hyperparathyroidism of renal origin: Secondary | ICD-10-CM | POA: Diagnosis not present

## 2020-06-24 DIAGNOSIS — Z992 Dependence on renal dialysis: Secondary | ICD-10-CM | POA: Diagnosis not present

## 2020-06-24 DIAGNOSIS — N186 End stage renal disease: Secondary | ICD-10-CM | POA: Diagnosis not present

## 2020-06-24 DIAGNOSIS — E1122 Type 2 diabetes mellitus with diabetic chronic kidney disease: Secondary | ICD-10-CM | POA: Diagnosis not present

## 2020-06-25 DIAGNOSIS — N2581 Secondary hyperparathyroidism of renal origin: Secondary | ICD-10-CM | POA: Diagnosis not present

## 2020-06-25 DIAGNOSIS — Z992 Dependence on renal dialysis: Secondary | ICD-10-CM | POA: Diagnosis not present

## 2020-06-25 DIAGNOSIS — N186 End stage renal disease: Secondary | ICD-10-CM | POA: Diagnosis not present

## 2020-06-27 DIAGNOSIS — N186 End stage renal disease: Secondary | ICD-10-CM | POA: Diagnosis not present

## 2020-06-27 DIAGNOSIS — N2581 Secondary hyperparathyroidism of renal origin: Secondary | ICD-10-CM | POA: Diagnosis not present

## 2020-06-27 DIAGNOSIS — Z992 Dependence on renal dialysis: Secondary | ICD-10-CM | POA: Diagnosis not present

## 2020-06-30 DIAGNOSIS — Z992 Dependence on renal dialysis: Secondary | ICD-10-CM | POA: Diagnosis not present

## 2020-06-30 DIAGNOSIS — N186 End stage renal disease: Secondary | ICD-10-CM | POA: Diagnosis not present

## 2020-06-30 DIAGNOSIS — N2581 Secondary hyperparathyroidism of renal origin: Secondary | ICD-10-CM | POA: Diagnosis not present

## 2020-07-02 DIAGNOSIS — N2581 Secondary hyperparathyroidism of renal origin: Secondary | ICD-10-CM | POA: Diagnosis not present

## 2020-07-02 DIAGNOSIS — N186 End stage renal disease: Secondary | ICD-10-CM | POA: Diagnosis not present

## 2020-07-02 DIAGNOSIS — Z992 Dependence on renal dialysis: Secondary | ICD-10-CM | POA: Diagnosis not present

## 2020-07-03 DIAGNOSIS — D500 Iron deficiency anemia secondary to blood loss (chronic): Secondary | ICD-10-CM | POA: Diagnosis not present

## 2020-07-03 DIAGNOSIS — Z791 Long term (current) use of non-steroidal anti-inflammatories (NSAID): Secondary | ICD-10-CM | POA: Diagnosis not present

## 2020-07-03 DIAGNOSIS — Z992 Dependence on renal dialysis: Secondary | ICD-10-CM | POA: Diagnosis not present

## 2020-07-03 DIAGNOSIS — R195 Other fecal abnormalities: Secondary | ICD-10-CM | POA: Diagnosis not present

## 2020-07-04 DIAGNOSIS — N186 End stage renal disease: Secondary | ICD-10-CM | POA: Diagnosis not present

## 2020-07-04 DIAGNOSIS — N2581 Secondary hyperparathyroidism of renal origin: Secondary | ICD-10-CM | POA: Diagnosis not present

## 2020-07-04 DIAGNOSIS — Z992 Dependence on renal dialysis: Secondary | ICD-10-CM | POA: Diagnosis not present

## 2020-07-07 DIAGNOSIS — Z20822 Contact with and (suspected) exposure to covid-19: Secondary | ICD-10-CM | POA: Diagnosis not present

## 2020-07-07 DIAGNOSIS — N186 End stage renal disease: Secondary | ICD-10-CM | POA: Diagnosis not present

## 2020-07-07 DIAGNOSIS — Z992 Dependence on renal dialysis: Secondary | ICD-10-CM | POA: Diagnosis not present

## 2020-07-07 DIAGNOSIS — N2581 Secondary hyperparathyroidism of renal origin: Secondary | ICD-10-CM | POA: Diagnosis not present

## 2020-07-09 DIAGNOSIS — N186 End stage renal disease: Secondary | ICD-10-CM | POA: Diagnosis not present

## 2020-07-09 DIAGNOSIS — N2581 Secondary hyperparathyroidism of renal origin: Secondary | ICD-10-CM | POA: Diagnosis not present

## 2020-07-09 DIAGNOSIS — Z992 Dependence on renal dialysis: Secondary | ICD-10-CM | POA: Diagnosis not present

## 2020-07-10 ENCOUNTER — Telehealth: Payer: Self-pay | Admitting: Nurse Practitioner

## 2020-07-10 ENCOUNTER — Other Ambulatory Visit (HOSPITAL_COMMUNITY): Payer: Self-pay

## 2020-07-10 ENCOUNTER — Telehealth (HOSPITAL_COMMUNITY): Payer: Self-pay

## 2020-07-10 DIAGNOSIS — I129 Hypertensive chronic kidney disease with stage 1 through stage 4 chronic kidney disease, or unspecified chronic kidney disease: Secondary | ICD-10-CM | POA: Diagnosis not present

## 2020-07-10 DIAGNOSIS — I120 Hypertensive chronic kidney disease with stage 5 chronic kidney disease or end stage renal disease: Secondary | ICD-10-CM | POA: Diagnosis not present

## 2020-07-10 DIAGNOSIS — U071 COVID-19: Secondary | ICD-10-CM | POA: Diagnosis not present

## 2020-07-10 DIAGNOSIS — R918 Other nonspecific abnormal finding of lung field: Secondary | ICD-10-CM | POA: Diagnosis not present

## 2020-07-10 DIAGNOSIS — N186 End stage renal disease: Secondary | ICD-10-CM | POA: Diagnosis not present

## 2020-07-10 DIAGNOSIS — R778 Other specified abnormalities of plasma proteins: Secondary | ICD-10-CM | POA: Diagnosis not present

## 2020-07-10 DIAGNOSIS — R531 Weakness: Secondary | ICD-10-CM | POA: Diagnosis not present

## 2020-07-10 DIAGNOSIS — J1282 Pneumonia due to coronavirus disease 2019: Secondary | ICD-10-CM | POA: Diagnosis not present

## 2020-07-10 NOTE — Telephone Encounter (Signed)
Called to Discuss with patient about Covid symptoms and the use of a monoclonal antibody infusion for those with mild to moderate Covid symptoms and at a high risk of hospitalization.     Pt is qualified for this infusion at the [facility] due to co-morbid conditions and/or a member of an at-risk group.     Unable to reach pt. Left message to return call.   Hughes Wyndham, DNP, AGNP-C [phone] (Infusion Center Hotline)  

## 2020-07-10 NOTE — Telephone Encounter (Signed)
Called to discuss with patient about Covid symptoms and the use of the monoclonal antibody infusion for those with mild to moderate Covid symptoms and at a high risk of hospitalization.     Pt appears to qualify for this infusion due to co-morbid conditions and/or a member of an at-risk group in accordance with the FDA Emergency Use Authorization.    Risk factors include: Age over 72. On dialysis, and has DM   Symptom onset: 12/14. Extreme weakness, mild cough, low grade fever.    Tested positive for COVID 19: 12/13 due to home exposure. Tested at dialysis.     Discussed information regarding costs of monoclonal antibody treatment, given both CPT & REV codes, and encouraged patient to call their health insurance company to verify cost of treatment that pt will be financially responsible for.  Patient aware they will receive a call from APP for further information and to schedule appointment. All questions answered.   Gave patient information the address to clinic, [address] and phone number to call once patient arrives, [phone].   Patient has dialysis MWF at 16:30.

## 2020-07-10 NOTE — Telephone Encounter (Signed)
Called to discuss with Thea Gist about Covid symptoms and the use of a monoclonal antibody infusion for those with mild to moderate Covid symptoms and at a high risk of hospitalization.     Pt is qualified for this infusion at the [facility] infusion center due to co-morbid conditions and/or a member of an at-risk group, however, patient's granddaughter who participated in call found grandfather, patient's spouse to be hypoxic with SpO2 of 62% on 2L oxygen. I advised her to turn up oxygen and to call 911 for immediate transfer to ER. Patient's SpO2 was 92%. Patient is not vaccinated and is very high risk for progression to severe illness. She will consider infusion with mab, number to clinic given. Granddaughter plans to have patient also evaluated in ER.   Last date eligible for infusion: 12/23.    Patient Active Problem List   Diagnosis Date Noted   Fall 07/06/2018   Left temporal lobe infarction ([place]) 09/05/2017   TIA (transient ischemic attack) 09/04/2017   CKD (chronic kidney disease) stage 3, GFR 30-59 ml/min ([place]) 09/03/2017   Essential hypertension 09/03/2017   Type 2 diabetes mellitus with stage 3 chronic kidney disease ([place]) 09/03/2017   Lobular carcinoma of right breast ([place]) 03/01/2011   Beckey Rutter, NP [phone] [email or web address]

## 2020-07-11 ENCOUNTER — Ambulatory Visit (HOSPITAL_COMMUNITY)
Admission: RE | Admit: 2020-07-11 | Discharge: 2020-07-11 | Disposition: A | Discharge status: Home / Self Care | Payer: Medicare Other | Source: Ambulatory Visit | Attending: Pulmonary Disease | Admitting: Pulmonary Disease

## 2020-07-11 ENCOUNTER — Other Ambulatory Visit: Payer: Self-pay | Admitting: Nurse Practitioner

## 2020-07-11 DIAGNOSIS — U071 COVID-19: Secondary | ICD-10-CM | POA: Diagnosis present

## 2020-07-11 DIAGNOSIS — N2581 Secondary hyperparathyroidism of renal origin: Secondary | ICD-10-CM | POA: Diagnosis not present

## 2020-07-11 DIAGNOSIS — Z23 Encounter for immunization: Secondary | ICD-10-CM | POA: Diagnosis not present

## 2020-07-11 DIAGNOSIS — N186 End stage renal disease: Secondary | ICD-10-CM | POA: Diagnosis not present

## 2020-07-11 DIAGNOSIS — Z992 Dependence on renal dialysis: Secondary | ICD-10-CM | POA: Diagnosis not present

## 2020-07-11 MED ORDER — SODIUM CHLORIDE 0.9 % IV SOLN: Freq: Once | INTRAVENOUS | Status: AC

## 2020-07-11 MED ORDER — DIPHENHYDRAMINE HCL 50 MG/ML IJ SOLN: 50.0000 mg | Freq: Once | INTRAMUSCULAR | Status: DC | PRN

## 2020-07-11 MED ORDER — METHYLPREDNISOLONE SODIUM SUCC 125 MG IJ SOLR: 125.0000 mg | Freq: Once | INTRAMUSCULAR | Status: DC | PRN

## 2020-07-11 MED ORDER — ALBUTEROL SULFATE HFA 108 (90 BASE) MCG/ACT IN AERS: 2.0000 | INHALATION_SPRAY | Freq: Once | RESPIRATORY_TRACT | Status: DC | PRN

## 2020-07-11 MED ORDER — FAMOTIDINE IN NACL 20-0.9 MG/50ML-% IV SOLN: 20.0000 mg | Freq: Once | INTRAVENOUS | Status: DC | PRN

## 2020-07-11 MED ORDER — EPINEPHRINE 0.3 MG/0.3ML IJ SOAJ: 0.3000 mg | Freq: Once | INTRAMUSCULAR | Status: DC | PRN

## 2020-07-11 MED ORDER — SODIUM CHLORIDE 0.9 % IV SOLN: INTRAVENOUS | Status: DC | PRN

## 2020-07-11 NOTE — Discharge Instructions (Signed)
10 Things You Can Do to Manage Your COVID-19 Symptoms at Home If you have possible or confirmed COVID-19: 1. Stay home from work and school. And stay away from other public places. If you must go out, avoid using any kind of public transportation, ridesharing, or taxis. 2. Monitor your symptoms carefully. If your symptoms get worse, call your healthcare provider immediately. 3. Get rest and stay hydrated. 4. If you have a medical appointment, call the healthcare provider ahead of time and tell them that you have or may have COVID-19. 5. For medical emergencies, call 911 and notify the dispatch personnel that you have or may have COVID-19. 6. Cover your cough and sneezes with a tissue or use the inside of your elbow. 7. Wash your hands often with soap and water for at least 20 seconds or clean your hands with an alcohol-based hand sanitizer that contains at least 60% alcohol. 8. As much as possible, stay in a specific room and away from other people in your home. Also, you should use a separate bathroom, if available. If you need to be around other people in or outside of the home, wear a mask. 9. Avoid sharing personal items with other people in your household, like dishes, towels, and bedding. 10. Clean all surfaces that are touched often, like counters, tabletops, and doorknobs. Use household cleaning sprays or wipes according to the label instructions. cdc.gov/coronavirus 01/24/2019 This information is not intended to replace advice given to you by your health care provider. Make sure you discuss any questions you have with your health care provider. Document Revised: 06/28/2019 Document Reviewed: 06/28/2019 Elsevier Patient Education  2020 Elsevier Inc. What types of side effects do monoclonal antibody drugs cause?  Common side effects  In general, the more common side effects caused by monoclonal antibody drugs include: . Allergic reactions, such as hives or itching . Flu-like signs and  symptoms, including chills, fatigue, fever, and muscle aches and pains . Nausea, vomiting . Diarrhea . Skin rashes . Low blood pressure   The CDC is recommending patients who receive monoclonal antibody treatments wait at least 90 days before being vaccinated.  Currently, there are no data on the safety and efficacy of mRNA COVID-19 vaccines in persons who received monoclonal antibodies or convalescent plasma as part of COVID-19 treatment. Based on the estimated half-life of such therapies as well as evidence suggesting that reinfection is uncommon in the 90 days after initial infection, vaccination should be deferred for at least 90 days, as a precautionary measure until additional information becomes available, to avoid interference of the antibody treatment with vaccine-induced immune responses. If you have any questions or concerns after the infusion please call the Advanced Practice Provider on call at [phone]. This number is ONLY intended for your use regarding questions or concerns about the infusion post-treatment side-effects.  Please do not provide this number to others for use. For return to work notes please contact your primary care provider.   If someone you know is interested in receiving treatment please have them call the COVID hotline at [phone].   

## 2020-07-11 NOTE — Progress Notes (Signed)
I connected by phone with [name] on 07/11/2020 at 12:20 PM to discuss the potential use of a treatment for mild to moderate COVID-19 viral infection in non-hospitalized patients.  This patient is a 79 y.o. female that meets the FDA criteria for Emergency Use Authorization of bamlanivimab/etesevimab, casirivimab\imdevimab, or sotrovimab  Has a (+) direct SARS-CoV-2 viral test result  Has mild or moderate COVID-19   Is ? 79 years of age and weighs ? 40 kg  Is NOT hospitalized due to COVID-19  Is NOT requiring oxygen therapy or requiring an increase in baseline oxygen flow rate due to COVID-19  Is within 10 days of symptom onset  Has at least one of the high risk factor(s) for progression to severe COVID-19 and/or hospitalization as defined in EUA.  Specific high risk criteria : Older age (>/= 78 yo), Chronic Kidney Disease (CKD), Diabetes, Cardiovascular disease or hypertension and Other high risk medical condition per CDC:  SVI   I have spoken and communicated the following to the patient or parent/caregiver:  1. FDA has authorized the emergency use of bamlanivimab/etesevimab, casirivimab\imdevimab, or sotrovimab for the treatment of mild to moderate COVID-19 in adults and pediatric patients with positive results of direct SARS-CoV-2 viral testing who are 60 years of age and older weighing at least 40 kg, and who are at high risk for progressing to severe COVID-19 and/or hospitalization.  2. The significant known and potential risks and benefits of bamlanivimab/etesevimab, casirivimab\imdevimab, or sotrovimab, and the extent to which such potential risks and benefits are unknown.  3. Information on available alternative treatments and the risks and benefits of those alternatives, including clinical trials.  4. Patients treated with bamlanivimab/etesevimab, casirivimab\imdevimab, or sotrovimab should continue to self-isolate and use infection control measures (e.g., wear mask,  isolate, social distance, avoid sharing personal items, clean and disinfect "high touch" surfaces, and frequent handwashing) according to CDC guidelines.   5. The patient or parent/caregiver has the option to accept or refuse bamlanivimab/etesevimab, casirivimab\imdevimab, or sotrovimab.  After reviewing this information with the patient, the patient has agreed to receive one of the available covid 19 monoclonal antibodies and will be provided an appropriate fact sheet prior to infusion.Beckey Rutter, [place], AGNP-C [phone] ([place])

## 2020-07-11 NOTE — Progress Notes (Signed)
Patient reviewed Fact Sheet for Patients, Parents, and Caregivers for Emergency Use Authorization (EUA) of bamlanivimab and etesevimab for the Treatment of Coronavirus. Patient also reviewed and is agreeable to the estimated cost of treatment. Patient is agreeable to proceed.   

## 2020-07-11 NOTE — Progress Notes (Signed)
  Diagnosis: COVID-19  Physician: Dr. Wright   Procedure: Covid Infusion Clinic Med: bamlanivimab\etesevimab infusion - Provided patient with bamlanimivab\etesevimab fact sheet for patients, parents and caregivers prior to infusion.  Complications: No immediate complications noted.  Discharge: Discharged home   [name]  [name] 07/11/2020   

## 2020-07-12 ENCOUNTER — Emergency Department (HOSPITAL_COMMUNITY): Payer: Medicare HMO

## 2020-07-12 ENCOUNTER — Inpatient Hospital Stay (HOSPITAL_COMMUNITY)
Admission: EM | Admit: 2020-07-12 | Discharge: 2020-07-31 | DRG: 064 | Disposition: A | Discharge status: Skilled Nursing Facility | Payer: Medicare HMO | Attending: Family Medicine | Admitting: Family Medicine

## 2020-07-12 ENCOUNTER — Ambulatory Visit: Payer: Self-pay | Admitting: *Deleted

## 2020-07-12 ENCOUNTER — Encounter (HOSPITAL_COMMUNITY): Payer: Self-pay | Admitting: Neurology

## 2020-07-12 DIAGNOSIS — K219 Gastro-esophageal reflux disease without esophagitis: Secondary | ICD-10-CM | POA: Diagnosis not present

## 2020-07-12 DIAGNOSIS — Z7401 Bed confinement status: Secondary | ICD-10-CM | POA: Diagnosis not present

## 2020-07-12 DIAGNOSIS — I120 Hypertensive chronic kidney disease with stage 5 chronic kidney disease or end stage renal disease: Secondary | ICD-10-CM | POA: Diagnosis not present

## 2020-07-12 DIAGNOSIS — E785 Hyperlipidemia, unspecified: Secondary | ICD-10-CM | POA: Diagnosis not present

## 2020-07-12 DIAGNOSIS — R29898 Other symptoms and signs involving the musculoskeletal system: Secondary | ICD-10-CM | POA: Diagnosis not present

## 2020-07-12 DIAGNOSIS — I340 Nonrheumatic mitral (valve) insufficiency: Secondary | ICD-10-CM | POA: Diagnosis not present

## 2020-07-12 DIAGNOSIS — K920 Hematemesis: Secondary | ICD-10-CM | POA: Diagnosis not present

## 2020-07-12 DIAGNOSIS — I480 Paroxysmal atrial fibrillation: Secondary | ICD-10-CM | POA: Diagnosis not present

## 2020-07-12 DIAGNOSIS — R1114 Bilious vomiting: Secondary | ICD-10-CM | POA: Diagnosis not present

## 2020-07-12 DIAGNOSIS — R29810 Facial weakness: Secondary | ICD-10-CM | POA: Diagnosis not present

## 2020-07-12 DIAGNOSIS — Z794 Long term (current) use of insulin: Secondary | ICD-10-CM

## 2020-07-12 DIAGNOSIS — K2990 Gastroduodenitis, unspecified, without bleeding: Secondary | ICD-10-CM | POA: Diagnosis not present

## 2020-07-12 DIAGNOSIS — G9341 Metabolic encephalopathy: Secondary | ICD-10-CM | POA: Diagnosis not present

## 2020-07-12 DIAGNOSIS — I619 Nontraumatic intracerebral hemorrhage, unspecified: Secondary | ICD-10-CM

## 2020-07-12 DIAGNOSIS — Z803 Family history of malignant neoplasm of breast: Secondary | ICD-10-CM

## 2020-07-12 DIAGNOSIS — I6381 Other cerebral infarction due to occlusion or stenosis of small artery: Secondary | ICD-10-CM | POA: Diagnosis not present

## 2020-07-12 DIAGNOSIS — Z4659 Encounter for fitting and adjustment of other gastrointestinal appliance and device: Secondary | ICD-10-CM

## 2020-07-12 DIAGNOSIS — G8104 Flaccid hemiplegia affecting left nondominant side: Secondary | ICD-10-CM | POA: Diagnosis present

## 2020-07-12 DIAGNOSIS — U071 COVID-19: Secondary | ICD-10-CM

## 2020-07-12 DIAGNOSIS — H547 Unspecified visual loss: Secondary | ICD-10-CM | POA: Diagnosis not present

## 2020-07-12 DIAGNOSIS — Z79811 Long term (current) use of aromatase inhibitors: Secondary | ICD-10-CM

## 2020-07-12 DIAGNOSIS — M1611 Unilateral primary osteoarthritis, right hip: Secondary | ICD-10-CM | POA: Diagnosis present

## 2020-07-12 DIAGNOSIS — M25551 Pain in right hip: Secondary | ICD-10-CM | POA: Diagnosis not present

## 2020-07-12 DIAGNOSIS — R1110 Vomiting, unspecified: Secondary | ICD-10-CM | POA: Diagnosis not present

## 2020-07-12 DIAGNOSIS — D696 Thrombocytopenia, unspecified: Secondary | ICD-10-CM | POA: Diagnosis present

## 2020-07-12 DIAGNOSIS — K2981 Duodenitis with bleeding: Secondary | ICD-10-CM | POA: Diagnosis present

## 2020-07-12 DIAGNOSIS — D631 Anemia in chronic kidney disease: Secondary | ICD-10-CM | POA: Diagnosis not present

## 2020-07-12 DIAGNOSIS — M898X9 Other specified disorders of bone, unspecified site: Secondary | ICD-10-CM | POA: Diagnosis present

## 2020-07-12 DIAGNOSIS — E1122 Type 2 diabetes mellitus with diabetic chronic kidney disease: Secondary | ICD-10-CM | POA: Diagnosis present

## 2020-07-12 DIAGNOSIS — I639 Cerebral infarction, unspecified: Secondary | ICD-10-CM | POA: Diagnosis present

## 2020-07-12 DIAGNOSIS — K922 Gastrointestinal hemorrhage, unspecified: Secondary | ICD-10-CM

## 2020-07-12 DIAGNOSIS — N2581 Secondary hyperparathyroidism of renal origin: Secondary | ICD-10-CM | POA: Diagnosis present

## 2020-07-12 DIAGNOSIS — I4891 Unspecified atrial fibrillation: Secondary | ICD-10-CM | POA: Diagnosis not present

## 2020-07-12 DIAGNOSIS — K2971 Gastritis, unspecified, with bleeding: Secondary | ICD-10-CM | POA: Diagnosis present

## 2020-07-12 DIAGNOSIS — M2550 Pain in unspecified joint: Secondary | ICD-10-CM | POA: Diagnosis not present

## 2020-07-12 DIAGNOSIS — I6603 Occlusion and stenosis of bilateral middle cerebral arteries: Secondary | ICD-10-CM | POA: Diagnosis not present

## 2020-07-12 DIAGNOSIS — Z0189 Encounter for other specified special examinations: Secondary | ICD-10-CM

## 2020-07-12 DIAGNOSIS — Z96642 Presence of left artificial hip joint: Secondary | ICD-10-CM | POA: Diagnosis present

## 2020-07-12 DIAGNOSIS — J1282 Pneumonia due to coronavirus disease 2019: Secondary | ICD-10-CM | POA: Diagnosis present

## 2020-07-12 DIAGNOSIS — R29708 NIHSS score 8: Secondary | ICD-10-CM | POA: Diagnosis present

## 2020-07-12 DIAGNOSIS — E1165 Type 2 diabetes mellitus with hyperglycemia: Secondary | ICD-10-CM | POA: Diagnosis not present

## 2020-07-12 DIAGNOSIS — Z283 Underimmunization status: Secondary | ICD-10-CM

## 2020-07-12 DIAGNOSIS — I700 Atherosclerosis of aorta: Secondary | ICD-10-CM | POA: Diagnosis not present

## 2020-07-12 DIAGNOSIS — Z7982 Long term (current) use of aspirin: Secondary | ICD-10-CM

## 2020-07-12 DIAGNOSIS — Z4682 Encounter for fitting and adjustment of non-vascular catheter: Secondary | ICD-10-CM | POA: Diagnosis not present

## 2020-07-12 DIAGNOSIS — Z9011 Acquired absence of right breast and nipple: Secondary | ICD-10-CM | POA: Diagnosis not present

## 2020-07-12 DIAGNOSIS — Z853 Personal history of malignant neoplasm of breast: Secondary | ICD-10-CM

## 2020-07-12 DIAGNOSIS — E669 Obesity, unspecified: Secondary | ICD-10-CM | POA: Diagnosis present

## 2020-07-12 DIAGNOSIS — D0501 Lobular carcinoma in situ of right breast: Secondary | ICD-10-CM | POA: Diagnosis not present

## 2020-07-12 DIAGNOSIS — Z992 Dependence on renal dialysis: Secondary | ICD-10-CM | POA: Diagnosis not present

## 2020-07-12 DIAGNOSIS — Z6830 Body mass index (BMI) 30.0-30.9, adult: Secondary | ICD-10-CM

## 2020-07-12 DIAGNOSIS — M549 Dorsalgia, unspecified: Secondary | ICD-10-CM | POA: Diagnosis present

## 2020-07-12 DIAGNOSIS — R58 Hemorrhage, not elsewhere classified: Secondary | ICD-10-CM | POA: Diagnosis not present

## 2020-07-12 DIAGNOSIS — D509 Iron deficiency anemia, unspecified: Secondary | ICD-10-CM | POA: Diagnosis not present

## 2020-07-12 DIAGNOSIS — N186 End stage renal disease: Secondary | ICD-10-CM | POA: Diagnosis not present

## 2020-07-12 DIAGNOSIS — J811 Chronic pulmonary edema: Secondary | ICD-10-CM | POA: Diagnosis present

## 2020-07-12 DIAGNOSIS — Z79899 Other long term (current) drug therapy: Secondary | ICD-10-CM

## 2020-07-12 DIAGNOSIS — R112 Nausea with vomiting, unspecified: Secondary | ICD-10-CM | POA: Diagnosis not present

## 2020-07-12 DIAGNOSIS — K921 Melena: Secondary | ICD-10-CM | POA: Diagnosis not present

## 2020-07-12 DIAGNOSIS — R29818 Other symptoms and signs involving the nervous system: Secondary | ICD-10-CM | POA: Diagnosis not present

## 2020-07-12 DIAGNOSIS — I959 Hypotension, unspecified: Secondary | ICD-10-CM | POA: Diagnosis not present

## 2020-07-12 DIAGNOSIS — I629 Nontraumatic intracranial hemorrhage, unspecified: Secondary | ICD-10-CM | POA: Diagnosis not present

## 2020-07-12 DIAGNOSIS — I10 Essential (primary) hypertension: Secondary | ICD-10-CM | POA: Diagnosis not present

## 2020-07-12 DIAGNOSIS — I6389 Other cerebral infarction: Secondary | ICD-10-CM | POA: Diagnosis not present

## 2020-07-12 DIAGNOSIS — K264 Chronic or unspecified duodenal ulcer with hemorrhage: Secondary | ICD-10-CM | POA: Diagnosis present

## 2020-07-12 DIAGNOSIS — R0902 Hypoxemia: Secondary | ICD-10-CM | POA: Diagnosis not present

## 2020-07-12 DIAGNOSIS — M47816 Spondylosis without myelopathy or radiculopathy, lumbar region: Secondary | ICD-10-CM | POA: Diagnosis not present

## 2020-07-12 DIAGNOSIS — J9601 Acute respiratory failure with hypoxia: Secondary | ICD-10-CM | POA: Diagnosis present

## 2020-07-12 DIAGNOSIS — D62 Acute posthemorrhagic anemia: Secondary | ICD-10-CM | POA: Diagnosis present

## 2020-07-12 DIAGNOSIS — G459 Transient cerebral ischemic attack, unspecified: Secondary | ICD-10-CM | POA: Diagnosis not present

## 2020-07-12 DIAGNOSIS — Z8673 Personal history of transient ischemic attack (TIA), and cerebral infarction without residual deficits: Secondary | ICD-10-CM

## 2020-07-12 DIAGNOSIS — Z801 Family history of malignant neoplasm of trachea, bronchus and lung: Secondary | ICD-10-CM

## 2020-07-12 DIAGNOSIS — R918 Other nonspecific abnormal finding of lung field: Secondary | ICD-10-CM | POA: Diagnosis not present

## 2020-07-12 DIAGNOSIS — I610 Nontraumatic intracerebral hemorrhage in hemisphere, subcortical: Secondary | ICD-10-CM | POA: Diagnosis not present

## 2020-07-12 DIAGNOSIS — I6523 Occlusion and stenosis of bilateral carotid arteries: Secondary | ICD-10-CM | POA: Diagnosis not present

## 2020-07-12 DIAGNOSIS — K5900 Constipation, unspecified: Secondary | ICD-10-CM | POA: Diagnosis not present

## 2020-07-12 DIAGNOSIS — K2970 Gastritis, unspecified, without bleeding: Secondary | ICD-10-CM

## 2020-07-12 DIAGNOSIS — J3489 Other specified disorders of nose and nasal sinuses: Secondary | ICD-10-CM | POA: Diagnosis not present

## 2020-07-12 DIAGNOSIS — E876 Hypokalemia: Secondary | ICD-10-CM | POA: Diagnosis present

## 2020-07-12 DIAGNOSIS — G319 Degenerative disease of nervous system, unspecified: Secondary | ICD-10-CM | POA: Diagnosis not present

## 2020-07-12 DIAGNOSIS — E119 Type 2 diabetes mellitus without complications: Secondary | ICD-10-CM

## 2020-07-12 DIAGNOSIS — D649 Anemia, unspecified: Secondary | ICD-10-CM | POA: Diagnosis not present

## 2020-07-12 HISTORY — DX: Essential (primary) hypertension: I10

## 2020-07-12 LAB — COMPREHENSIVE METABOLIC PANEL
ALT: 25 U/L (ref 0–44)
AST: 51 U/L — ABNORMAL HIGH (ref 15–41)
Albumin: 2.7 g/dL — ABNORMAL LOW (ref 3.5–5.0)
Alkaline Phosphatase: 51 U/L (ref 38–126)
Anion gap: 18 — ABNORMAL HIGH (ref 5–15)
BUN: 72 mg/dL — ABNORMAL HIGH (ref 8–23)
CO2: 20 mmol/L — ABNORMAL LOW (ref 22–32)
Calcium: 8.1 mg/dL — ABNORMAL LOW (ref 8.9–10.3)
Chloride: 99 mmol/L (ref 98–111)
Creatinine, Ser: 5.34 mg/dL — ABNORMAL HIGH (ref 0.44–1.00)
GFR, Estimated: 8 mL/min — ABNORMAL LOW (ref >60)
Glucose, Bld: 154 mg/dL — ABNORMAL HIGH (ref 70–99)
Potassium: 3.9 mmol/L (ref 3.5–5.1)
Sodium: 137 mmol/L (ref 135–145)
Total Bilirubin: 0.6 mg/dL (ref 0.3–1.2)
Total Protein: 6.1 g/dL — ABNORMAL LOW (ref 6.5–8.1)

## 2020-07-12 LAB — CBC
HCT: 35.4 % — ABNORMAL LOW (ref 36.0–46.0)
Hemoglobin: 10.8 g/dL — ABNORMAL LOW (ref 12.0–15.0)
MCH: 29 pg (ref 26.0–34.0)
MCHC: 30.5 g/dL (ref 30.0–36.0)
MCV: 95.2 fL (ref 80.0–100.0)
Platelets: 123 10*3/uL — ABNORMAL LOW (ref 150–400)
RBC: 3.72 MIL/uL — ABNORMAL LOW (ref 3.87–5.11)
RDW: 18.2 % — ABNORMAL HIGH (ref 11.5–15.5)
WBC: 5.4 10*3/uL (ref 4.0–10.5)
nRBC: 0 % (ref 0.0–0.2)

## 2020-07-12 LAB — DIFFERENTIAL
Abs Immature Granulocytes: 0 10*3/uL (ref 0.00–0.07)
Basophils Absolute: 0.1 10*3/uL (ref 0.0–0.1)
Basophils Relative: 1 %
Eosinophils Absolute: 0 10*3/uL (ref 0.0–0.5)
Eosinophils Relative: 0 %
Lymphocytes Relative: 8 %
Lymphs Abs: 0.4 10*3/uL — ABNORMAL LOW (ref 0.7–4.0)
Monocytes Absolute: 0.3 10*3/uL (ref 0.1–1.0)
Monocytes Relative: 5 %
Neutro Abs: 4.6 10*3/uL (ref 1.7–7.7)
Neutrophils Relative %: 86 %
nRBC: 2 /100 WBC — ABNORMAL HIGH

## 2020-07-12 LAB — BASIC METABOLIC PANEL
Anion gap: 15 (ref 5–15)
BUN: 81 mg/dL — ABNORMAL HIGH (ref 8–23)
CO2: 19 mmol/L — ABNORMAL LOW (ref 22–32)
Calcium: 7.7 mg/dL — ABNORMAL LOW (ref 8.9–10.3)
Chloride: 105 mmol/L (ref 98–111)
Creatinine, Ser: 5.59 mg/dL — ABNORMAL HIGH (ref 0.44–1.00)
GFR, Estimated: 7 mL/min — ABNORMAL LOW (ref >60)
Glucose, Bld: 143 mg/dL — ABNORMAL HIGH (ref 70–99)
Potassium: 3.9 mmol/L (ref 3.5–5.1)
Sodium: 139 mmol/L (ref 135–145)

## 2020-07-12 LAB — I-STAT CHEM 8, ED
BUN: 76 mg/dL — ABNORMAL HIGH (ref 8–23)
Calcium, Ion: 0.95 mmol/L — ABNORMAL LOW (ref 1.15–1.40)
Chloride: 103 mmol/L (ref 98–111)
Creatinine, Ser: 5.3 mg/dL — ABNORMAL HIGH (ref 0.44–1.00)
Glucose, Bld: 152 mg/dL — ABNORMAL HIGH (ref 70–99)
HCT: 31 % — ABNORMAL LOW (ref 36.0–46.0)
Hemoglobin: 10.5 g/dL — ABNORMAL LOW (ref 12.0–15.0)
Potassium: 3.9 mmol/L (ref 3.5–5.1)
Sodium: 136 mmol/L (ref 135–145)
TCO2: 21 mmol/L — ABNORMAL LOW (ref 22–32)

## 2020-07-12 LAB — PROTIME-INR
INR: 1.1 (ref 0.8–1.2)
Prothrombin Time: 13.5 seconds (ref 11.4–15.2)

## 2020-07-12 LAB — MRSA PCR SCREENING: MRSA by PCR: POSITIVE — AB

## 2020-07-12 LAB — MAGNESIUM: Magnesium: 1.6 mg/dL — ABNORMAL LOW (ref 1.7–2.4)

## 2020-07-12 LAB — CBG MONITORING, ED
Glucose-Capillary: 140 mg/dL — ABNORMAL HIGH (ref 70–99)
Glucose-Capillary: 216 mg/dL — ABNORMAL HIGH (ref 70–99)

## 2020-07-12 LAB — POC OCCULT BLOOD, ED: Fecal Occult Bld: POSITIVE — AB

## 2020-07-12 LAB — ABO/RH: ABO/RH(D): O POS

## 2020-07-12 LAB — PREPARE RBC (CROSSMATCH)

## 2020-07-12 LAB — APTT: aPTT: 32 seconds (ref 24–36)

## 2020-07-12 LAB — GLUCOSE, CAPILLARY
Glucose-Capillary: 120 mg/dL — ABNORMAL HIGH (ref 70–99)
Glucose-Capillary: 160 mg/dL — ABNORMAL HIGH (ref 70–99)

## 2020-07-12 MED ORDER — SODIUM CHLORIDE 0.9% FLUSH
3.0000 mL | Freq: Once | INTRAVENOUS | Status: AC
  Administered 2020-07-12: 11:00:00 3 mL via INTRAVENOUS

## 2020-07-12 MED ORDER — ONDANSETRON HCL 4 MG/2ML IJ SOLN
4.0000 mg | Freq: Once | INTRAMUSCULAR | Status: AC
  Administered 2020-07-12: 11:00:00 4 mg via INTRAVENOUS
  Filled 2020-07-12: qty 2

## 2020-07-12 MED ORDER — AMIODARONE HCL IN DEXTROSE 360-4.14 MG/200ML-% IV SOLN
30.0000 mg/h | INTRAVENOUS | Status: DC
  Administered 2020-07-13: 03:00:00 30 mg/h via INTRAVENOUS
  Filled 2020-07-12: qty 200

## 2020-07-12 MED ORDER — FENTANYL CITRATE (PF) 100 MCG/2ML IJ SOLN
25.0000 ug | Freq: Once | INTRAMUSCULAR | Status: AC
Start: 2020-07-12 — End: 2020-07-12
  Administered 2020-07-12: 11:00:00 25 ug via INTRAVENOUS
  Filled 2020-07-12: qty 2

## 2020-07-12 MED ORDER — AMIODARONE HCL IN DEXTROSE 360-4.14 MG/200ML-% IV SOLN
60.0000 mg/h | INTRAVENOUS | Status: DC
  Administered 2020-07-12: 21:00:00 60 mg/h via INTRAVENOUS
  Filled 2020-07-12: qty 200

## 2020-07-12 MED ORDER — SODIUM CHLORIDE 0.9 % IV SOLN
80.0000 mg | Freq: Once | INTRAVENOUS | Status: AC
  Administered 2020-07-12: 11:00:00 80 mg via INTRAVENOUS
  Filled 2020-07-12: qty 80

## 2020-07-12 MED ORDER — POLYETHYLENE GLYCOL 3350 17 G PO PACK
17.0000 g | PACK | Freq: Every day | ORAL | Status: DC | PRN
  Administered 2020-07-19: 14:00:00 17 g via ORAL
  Filled 2020-07-12: qty 1

## 2020-07-12 MED ORDER — DOCUSATE SODIUM 100 MG PO CAPS: 100.0000 mg | ORAL_CAPSULE | Freq: Two times a day (BID) | ORAL | Status: DC | PRN

## 2020-07-12 MED ORDER — SODIUM CHLORIDE 0.9 % IV BOLUS
1000.0000 mL | Freq: Once | INTRAVENOUS | Status: AC
  Administered 2020-07-12: 11:00:00 1000 mL via INTRAVENOUS

## 2020-07-12 MED ORDER — FENTANYL CITRATE (PF) 100 MCG/2ML IJ SOLN
50.0000 ug | INTRAMUSCULAR | Status: DC | PRN
  Administered 2020-07-12 – 2020-07-13 (×6): 50 ug via INTRAVENOUS
  Filled 2020-07-12 (×6): qty 2

## 2020-07-12 MED ORDER — SODIUM CHLORIDE 0.9% IV SOLUTION: Freq: Once | INTRAVENOUS | Status: AC

## 2020-07-12 MED ORDER — SODIUM CHLORIDE 0.9 % IV SOLN: INTRAVENOUS | Status: DC

## 2020-07-12 MED ORDER — SODIUM CHLORIDE 0.9 % IV SOLN
8.0000 mg/h | INTRAVENOUS | Status: DC
  Administered 2020-07-12 – 2020-07-14 (×4): 8 mg/h via INTRAVENOUS
  Filled 2020-07-12 (×6): qty 80

## 2020-07-12 MED ORDER — AMIODARONE LOAD VIA INFUSION
150.0000 mg | Freq: Once | INTRAVENOUS | Status: AC
  Administered 2020-07-12: 21:00:00 150 mg via INTRAVENOUS
  Filled 2020-07-12: qty 83.34

## 2020-07-12 MED ORDER — INSULIN ASPART 100 UNIT/ML ~~LOC~~ SOLN
0.0000 [IU] | SUBCUTANEOUS | Status: DC
  Administered 2020-07-12: 20:00:00 1 [IU] via SUBCUTANEOUS
  Administered 2020-07-12: 15:00:00 2 [IU] via SUBCUTANEOUS
  Administered 2020-07-13 (×2): 1 [IU] via SUBCUTANEOUS
  Administered 2020-07-16: 18:00:00 2 [IU] via SUBCUTANEOUS
  Administered 2020-07-16 – 2020-07-18 (×3): 1 [IU] via SUBCUTANEOUS
  Administered 2020-07-19 (×2): 2 [IU] via SUBCUTANEOUS
  Administered 2020-07-19: 20:00:00 3 [IU] via SUBCUTANEOUS
  Administered 2020-07-20: 1 [IU] via SUBCUTANEOUS
  Administered 2020-07-20 (×3): 2 [IU] via SUBCUTANEOUS
  Administered 2020-07-21: 23:00:00 1 [IU] via SUBCUTANEOUS
  Administered 2020-07-21 – 2020-07-22 (×3): 2 [IU] via SUBCUTANEOUS
  Administered 2020-07-22: 14:00:00 3 [IU] via SUBCUTANEOUS
  Administered 2020-07-22: 11:00:00 1 [IU] via SUBCUTANEOUS
  Administered 2020-07-22: 04:00:00 2 [IU] via SUBCUTANEOUS
  Administered 2020-07-22: 1 [IU] via SUBCUTANEOUS
  Administered 2020-07-22: 17:00:00 2 [IU] via SUBCUTANEOUS
  Administered 2020-07-23 (×2): 1 [IU] via SUBCUTANEOUS
  Administered 2020-07-23: 13:00:00 2 [IU] via SUBCUTANEOUS
  Administered 2020-07-23: 21:00:00 3 [IU] via SUBCUTANEOUS
  Administered 2020-07-24: 01:00:00 2 [IU] via SUBCUTANEOUS
  Administered 2020-07-24: 12:00:00 3 [IU] via SUBCUTANEOUS
  Administered 2020-07-24: 20:00:00 2 [IU] via SUBCUTANEOUS
  Administered 2020-07-24: 17:00:00 3 [IU] via SUBCUTANEOUS
  Administered 2020-07-25 (×2): 2 [IU] via SUBCUTANEOUS
  Administered 2020-07-25 (×2): 1 [IU] via SUBCUTANEOUS

## 2020-07-12 MED ORDER — ONDANSETRON HCL 4 MG/2ML IJ SOLN
4.0000 mg | Freq: Four times a day (QID) | INTRAMUSCULAR | Status: DC | PRN
  Administered 2020-07-12 – 2020-07-17 (×4): 4 mg via INTRAVENOUS
  Filled 2020-07-12 (×2): qty 2

## 2020-07-12 MED ORDER — SODIUM CHLORIDE 0.9 % IV BOLUS
1000.0000 mL | Freq: Once | INTRAVENOUS | Status: AC
  Administered 2020-07-12: 12:00:00 1000 mL via INTRAVENOUS

## 2020-07-12 NOTE — ED Notes (Signed)
On arrival back to ED room pt having active GI bleeding from rectum, dark black stool and vomiting dark emesis. MD present at bedside. Pt given suction to clear airway.   Pt cleaned and on pads. SKin break down and redness noted to patients buttocks and sacrum area.

## 2020-07-12 NOTE — Consult Note (Addendum)
[facility] Renal Consultation Note    Indication for Consultation:  Management of ESRD/hemodialysis; anemia, hypertension/volume and secondary hyperparathyroidism  HYQ:MVHQ, Jaber A, MD  HPI: [name] is a 79 y.o. female with ESRD 2/2 HTN on HD MWF at [facility], first starting in 04/2020.  Past medical history significant for DM, HTN, HLD, b/l hip fractures and breast cancer s/p treatment >5 years ago. Of note patient has been complaint with prescribed dialysis regimen, last treatment completed yesterday, where she left 1kg under her dry weight.    Majority of history obtained from chart review.  Patient diagnosed with COVID on 12/13 due to home exposure.  She received antibodies yesterday.  Last night at dialysis nurses reported weakness, cough, diarrhea (multiple trips to bathroom) but oriented and no SOB.  Stated she was seen at [facility] ED and released. Nurses note at OP dialysis center this AM reports daughter called worried b/c her mother had "been throwing up all night with brown bowel colored liquid and having black tarry stools. Daughter states she is also weak, confused, short of breath and running a 102 fever. Daughter stated she has called EMS twice but they want take her d/t oxygen saturations are 95%"  Dialysis nurse instructed her to call EMS again, tell them all her symptoms and request she be taken to [facility] or [facility] for evaluation.  Per ED note, EMS reported patient refused to be transported initially.  When EMS returned the 2nd time noted L sided facial droop, L leg weakness with associated decreased sensation and new onset A fib.  Patient presented to the ED for CODE Stroke.    Pertinent findings in the ED include +FOBT, tachycardia, CXR showed increasing patchy opacities left mid and lower lung may represent infection vs atelectasis, xray hip with no acute abnormalities, CT head showed Acute 7 mm hemorrhage within the right basal ganglia. No substantial mass  effect. Also chronic microvascular ischemic disease and remote right cerebellar lacunar infarct.  Patient has been admitted for further evaluation and management.   Past Medical History:  Diagnosis Date  . Cancer ([place])   . Diabetes mellitus   . Hyperlipidemia    Past Surgical History:  Procedure Laterality Date  . BREAST LUMPECTOMY  03/26/2011   right   Family History  Problem Relation Age of Onset  . Cancer Father        bronchial  . Cancer Paternal Aunt        breast  . Cancer Paternal Aunt        breast   Social History:  reports that she has never smoked. She has never used smokeless tobacco. She reports that she does not drink alcohol and does not use drugs. No Known Allergies Prior to Admission medications   Medication Sig Start Date End Date Taking? Authorizing Provider  amLODipine (NORVASC) 5 MG tablet Take 5 mg by mouth daily.    [provider]  aspirin 325 MG tablet Take 325 mg by mouth daily.    [provider]  atorvastatin (LIPITOR) 40 MG tablet Take 20 mg by mouth daily.     [provider]  B Complex-C (SUPER B COMPLEX PO) Take 1 tablet by mouth every other day.     [provider]  Calcium Carbonate (CALTRATE 600 PO) Take 1 tablet by mouth every other day.     [provider]  Cholecalciferol (VITAMIN D) 2000 UNITS tablet Take 4,000 Units by mouth daily.    [provider]  furosemide (LASIX) 40 MG tablet Take 40 mg by mouth 2 (two) times daily.     [provider]  hydrALAZINE (APRESOLINE) 25 MG tablet Take 1 tablet (25 mg total) by mouth 2 (two) times daily. 04/20/19   Nicholas Lose, MD  Insulin Degludec-Liraglutide (XULTOPHY) 100-3.6 UNIT-MG/ML SOPN Inject 36 Units into the skin every morning.     [provider]  letrozole (FEMARA) 2.5 MG tablet Take 1 tablet (2.5 mg total) by mouth daily. 04/21/20   Nicholas Lose, MD  metoprolol succinate (TOPROL-XL) 100 MG 24 hr tablet Take 100 mg by  mouth daily.  08/16/14   [provider]  potassium chloride SA (K-DUR) 20 MEQ tablet Take 1 tablet (20 mEq total) by mouth 2 (two) times daily. 04/20/19   Nicholas Lose, MD   Current Facility-Administered Medications  Medication Dose Route Frequency Provider Last Rate Last Admin  . 0.9 %  sodium chloride infusion   Intravenous Continuous Isla Pence, MD 125 mL/hr at 07/12/20 1123 New Bag at 07/12/20 1123  . docusate sodium (COLACE) capsule 100 mg  100 mg Oral BID PRN Olalere, Adewale A, MD      . fentaNYL (SUBLIMAZE) injection 50 mcg  50 mcg Intravenous Q2H PRN Olalere, Adewale A, MD   50 mcg at 07/12/20 1410  . insulin aspart (novoLOG) injection 0-6 Units  0-6 Units Subcutaneous Q4H Olalere, Adewale A, MD      . pantoprazole (PROTONIX) 80 mg in sodium chloride 0.9 % 100 mL (0.8 mg/mL) infusion  8 mg/hr Intravenous Continuous Isla Pence, MD 10 mL/hr at 07/12/20 1122 8 mg/hr at 07/12/20 1122  . polyethylene glycol (MIRALAX / GLYCOLAX) packet 17 g  17 g Oral Daily PRN Olalere, Adewale A, MD       Current Outpatient Medications  Medication Sig Dispense Refill  . amLODipine (NORVASC) 5 MG tablet Take 5 mg by mouth daily.    Marland Kitchen aspirin 325 MG tablet Take 325 mg by mouth daily.    Marland Kitchen atorvastatin (LIPITOR) 40 MG tablet Take 20 mg by mouth daily.     . B Complex-C (SUPER B COMPLEX PO) Take 1 tablet by mouth every other day.     . Calcium Carbonate (CALTRATE 600 PO) Take 1 tablet by mouth every other day.     . Cholecalciferol (VITAMIN D) 2000 UNITS tablet Take 4,000 Units by mouth daily.    . furosemide (LASIX) 40 MG tablet Take 40 mg by mouth 2 (two) times daily.     . hydrALAZINE (APRESOLINE) 25 MG tablet Take 1 tablet (25 mg total) by mouth 2 (two) times daily.    . Insulin Degludec-Liraglutide (XULTOPHY) 100-3.6 UNIT-MG/ML SOPN Inject 36 Units into the skin every morning.     Marland Kitchen letrozole (FEMARA) 2.5 MG tablet Take 1 tablet (2.5 mg total) by mouth daily. 90 tablet 3  . metoprolol  succinate (TOPROL-XL) 100 MG 24 hr tablet Take 100 mg by mouth daily.     . potassium chloride SA (K-DUR) 20 MEQ tablet Take 1 tablet (20 mEq total) by mouth 2 (two) times daily.     Labs: Basic Metabolic Panel: Recent Labs  Lab 07/12/20 0947 07/12/20 0954  NA 137 136  K 3.9 3.9  CL 99 103  CO2 20*  --   GLUCOSE 154* 152*  BUN 72* 76*  CREATININE 5.34* 5.30*  CALCIUM 8.1*  --    Liver Function Tests: Recent Labs  Lab 07/12/20 0947  AST 51*  ALT  25  ALKPHOS 51  BILITOT 0.6  PROT 6.1*  ALBUMIN 2.7*   CBC: Recent Labs  Lab 07/12/20 0947 07/12/20 0954  WBC 5.4  --   NEUTROABS 4.6  --   HGB 10.8* 10.5*  HCT 35.4* 31.0*  MCV 95.2  --   PLT 123*  --    CBG: Recent Labs  Lab 07/12/20 0945  GLUCAP 140*   Studies/Results: DG Chest 1 View  Result Date: 07/12/2020 CLINICAL DATA:  Patient with history of COVID-19. History of GI bleed. EXAM: CHEST  1 VIEW COMPARISON:  Chest radiograph 07/10/2020. FINDINGS: Stable right-sided dialysis catheter. Monitoring leads overlie the patient. Stable cardiomegaly. Aortic atherosclerosis. Site oval increased patchy opacities left mid lower lung. No definite pleural effusion or pneumothorax. Thoracic spine degenerative changes. Surgical clips right axilla. IMPRESSION: Increasing patchy opacities left mid and lower lung may represent infection in the appropriate clinical setting or potentially atelectasis. Electronically Signed   By: Lovey Newcomer M.D.   On: 07/12/2020 11:24   DG Pelvis Portable  Result Date: 07/12/2020 CLINICAL DATA:  Right hip pain.  No reported injury. EXAM: PORTABLE PELVIS 1-2 VIEWS COMPARISON:  07/21/2018 outside pelvic radiographs FINDINGS: Partially visualized left hip hemiarthroplasty and partially visualized fixation hardware in the proximal right femur with no evidence of hardware fracture or loosening. No evidence of hip dislocation on this single frontal view. No osseous fracture. No focal osseous lesions.  Moderate right hip osteoarthritis. No suspicious focal osseous lesions. No pelvic diastasis. IMPRESSION: No acute osseous abnormality. No evidence of hip dislocation on this single frontal view. Moderate right hip osteoarthritis. Partially visualized left hip hemiarthroplasty and proximal right femur fixation hardware, with no evidence of hardware complication. Electronically Signed   By: Ilona Sorrel M.D.   On: 07/12/2020 12:33   CT HEAD CODE STROKE WO CONTRAST  Addendum Date: 07/12/2020   ADDENDUM REPORT: 07/12/2020 10:16 ADDENDUM: Correction.  Findings were discussed with Dr. Theda Sers. Electronically Signed   By: Margaretha Sheffield MD   On: 07/12/2020 10:16   Result Date: 07/12/2020 CLINICAL DATA:  Code stroke.  Neuro deficit, acute stroke suspected. EXAM: CT HEAD WITHOUT CONTRAST TECHNIQUE: Contiguous axial images were obtained from the base of the skull through the vertex without intravenous contrast. COMPARISON:  MRI head August 17, 2016 FINDINGS: Brain: Acute hemorrhage in the right basal ganglia, centered in the region of the posterior putamen/posterior limb of the internal capsule. The hemorrhage measures approximately 7 x 4 by 6 mm. No evidence of acute large vascular territory infarct. Patchy white matter hypoattenuation, which is nonspecific but most likely related to chronic microvascular ischemic disease. Mild generalized atrophy. No hydrocephalus. No evidence of a mass lesion. No midline shift. Remote right cerebellar lacunar infarct. Vascular: Calcific atherosclerosis. Skull: No acute fracture. Sinuses/Orbits: Sinuses are clear.  Unremarkable orbits. Other: No mastoid effusion. IMPRESSION: 1. Acute 7 mm hemorrhage within the right basal ganglia. No substantial mass effect. 2. Chronic microvascular ischemic disease and remote right cerebellar lacunar infarct. Code stroke imaging results were communicated on 07/12/2020 at 10:00 am to provider Dr. Lorrin Goodell via telephone, who verbally  acknowledged these results. Electronically Signed: By: Margaretha Sheffield MD On: 07/12/2020 10:05    ROS: Complete ROS performed and negative except as noted in HPI.   Physical Exam: Vitals:   07/12/20 1353 07/12/20 1400 07/12/20 1412 07/12/20 1415  BP: (!) 154/81 (!) 158/60 (!) 158/60 (!) 117/95  Pulse:  (!) 115 (!) 115   Resp:   14 11  Temp:  98.2 F (36.8 C)   TempSrc:   Oral   SpO2:  95% 95%      General: WDWN elderly female in NAD Head: NCAT sclera not icteric MMM Neck: Supple. No JVD Lungs: CTA bilaterally. No wheeze, rales or rhonchi. Breathing is unlabored on 2L via [place] Heart: RRR. No murmur, rubs or gallops.  Abdomen: soft, nontender, +BS, no guarding, no rebound tenderness Lower extremities:no edema, ischemic changes, or open wounds  Neuro: AAOx3. Moves all extremities spontaneously. Psych:  Responds to questions appropriately with a normal affect. Dialysis Access: TDC RIJ, clean and dry  Dialysis Orders:  MWF  - [facility]  3.5hrs (running 3hrs on COVID shift this wk)  BFR 400, DFR 800,  EDW 79kg, 3K/ 2.25Ca, P2  Access: TDC Heparin 3500 unit bolus Calcitriol 0.5 mcg PO qHD  Assessment/Plan: 1.  Stroke - CT with basal ganglia hemorrhagic stroke.  Per neuro/admit 2. GI bleed - +FOBT.  Hgb stable. Previously on ESA and iron. Was awaiting outpatient GI work up. GI consulted.  3. New onset fib - no known history.  Not on anticoagulation.  Rate controlled. per admit 4. Recent COVID Dx - received monoclonal antibodies yesterday.  Not vaccinated. Per admit 5.  ESRD -  On HD MWF.  Last HD Friday as outpatient on schedule.  No acute dialysis needs at this time. K3.9.  NO HEPARIN. Next HD 12/20. 6.  Hypertension/volume  - Blood pressure elevated.  Continue home meds.  7.  Anemia of CKD - Hgb 10.5. Not currently on ESA or iron.  Follow trends.  8.  Secondary Hyperparathyroidism -  Ca at goal. Phos elevated as OP, continue binders and VDRA once eating.  9.  Nutrition - currently  NPO 10. Hypokalemia - on 33mEq potassium daily.   Jen Mow, PA-C [facility] 07/12/2020, 2:50 PM   Pt seen, examined and agree w assess/plan as above with additions as indicated.  [facility] 07/12/2020, 5:54 PM

## 2020-07-12 NOTE — Progress Notes (Signed)
[facility] Progress Note Patient Name: [name] DOB: 28-Sep-1940 MRN: [id number]   Date of Service  07/12/2020  HPI/Events of Note  Multiple issues: 1. Nausea - Qtc interval = 0.3 seconds and 2. AFIB with RVR - Ventricular rate = 102 - 140.   eICU Interventions  Plan: 1. Amiodarone IV load and infusion. 2. Zofran 4 mg IV Q 6 hours PRN N/V. 3. BMP and Mg++ level STAT.     Intervention Category Major Interventions: Arrhythmia - evaluation and management;Other:  Lysle Dingwall 07/12/2020, 8:30 PM

## 2020-07-12 NOTE — Consult Note (Addendum)
Neurology Consult H&P  CC: left face and leg droop  History is obtained from: EMS, Patient, chart  HPI: [name] is a 79 y.o. female breast cancer, DM on HD, HLD and ~0400 EMS called to house for GI bleed was neurologically intact and she refused transport. EMS was called again where they noticed a left sided facial droop and left leg weakness with associated decreased sensation and new onset atrial fib.   Currently complaining severe pain in her back and legs.  - Diagnosed with Covid on 12/13 due to a home exposure - Not vaccinated.   - Had dialysis yesterday. Had antibody infusion yesterday.    LKW: 0400 tpa given?: No, active GI bleed IR Thrombectomy? No, LVO Modified Rankin Scale: 0-Completely asymptomatic and back to baseline post- stroke  LOC Responsiveness 0 LOC Questions 0 LOC Commands 0 Horizontal eye movement 0 Visual field 1 Facial palsy 0 Motor arm - Right arm 1 Motor arm - Left arm 1 Motor leg - Right leg 1 Motor leg - Left leg 3 Limb ataxia 0 Sensory test 1 Language 0 Speech 0 Extinction and inattention 0  NIHSS 8    ROS: A complete ROS was performed and is negative except as noted in the HPI.   Past Medical History:  Diagnosis Date  . Cancer ([place])   . Diabetes mellitus   . Hyperlipidemia    Family History  Problem Relation Age of Onset  . Cancer Father        bronchial  . Cancer Paternal Aunt        breast  . Cancer Paternal Aunt        breast   Social History:  reports that she has never smoked. She has never used smokeless tobacco. She reports that she does not drink alcohol and does not use drugs.  Prior to Admission medications   Medication Sig Start Date End Date Taking? Authorizing Provider  amLODipine (NORVASC) 5 MG tablet Take 5 mg by mouth daily.    [provider]  aspirin 325 MG tablet Take 325 mg by mouth daily.    [provider]  atorvastatin (LIPITOR) 40 MG tablet Take 20 mg by mouth daily.     [provider]  B Complex-C (SUPER B COMPLEX PO) Take 1 tablet by mouth every other day.     [provider]  Calcium Carbonate (CALTRATE 600 PO) Take 1 tablet by mouth every other day.     [provider]  Cholecalciferol (VITAMIN D) 2000 UNITS tablet Take 4,000 Units by mouth daily.    [provider]  furosemide (LASIX) 40 MG tablet Take 40 mg by mouth 2 (two) times daily.     [provider]  hydrALAZINE (APRESOLINE) 25 MG tablet Take 1 tablet (25 mg total) by mouth 2 (two) times daily. 04/20/19   Nicholas Lose, MD  Insulin Degludec-Liraglutide (XULTOPHY) 100-3.6 UNIT-MG/ML SOPN Inject 36 Units into the skin every morning.     [provider]  letrozole (FEMARA) 2.5 MG tablet Take 1 tablet (2.5 mg total) by mouth daily. 04/21/20   Nicholas Lose, MD  metoprolol succinate (TOPROL-XL) 100 MG 24 hr tablet Take 100 mg by mouth daily.  08/16/14   [provider]  potassium chloride SA (K-DUR) 20 MEQ tablet Take 1 tablet (20 mEq total) by mouth 2 (two) times daily. 04/20/19   Nicholas Lose, MD   Exam: Current vital signs: BP 125/85   Pulse (!) 118  Resp 15   SpO2 92%   Physical Exam  Constitutional: Appears well-developed and well-nourished.  Psych: Affect appropriate to situation Eyes: No scleral injection HENT: No OP obstrucion Head: Normocephalic.  Cardiovascular: Normal rate and regular rhythm.  Respiratory: Effort normal and breath sounds normal to anterior ascultation GI: Soft.  No distension. There is no tenderness.  Skin: WDI  Neuro: Mental Status: Patient is awake, alert, oriented to person, place, month, year, and situation. Patient is able to give a clear and coherent history. No signs of aphasia or neglect. Cranial Nerves: II: Visual Fields seems to have left hemianopsia. Pupils are equal, round poorly reactive to light. III,IV, VI: EOMI without ptosis or diploplia.  V: Facial sensation is symmetric to temperature VII:  Facial movement is symmetric.  VIII: hearing is intact to voice X: Uvula elevates symmetrically XI: Shoulder shrug is symmetric. XII: tongue is midline without atrophy or fasciculations.  Motor: Tone is normal. Bulk is normal. 5/5 strength on right, 4-5/5 left upper extremity, ~2/5 left lower extremity. Sensory: Sensation is decreased temperature left lower extremity. Deep Tendon Reflexes: 2+ and symmetric in the biceps and patellae. Plantars: Toes are downgoing on right mute on left. Cerebellar: FNF and HKS are intact bilaterally.  I have reviewed labs in epic and the pertinent results are:   Ref. Range 07/12/2020 09:54  Glucose Latest Ref Range: 70 - 99 mg/dL 152 (H)  BUN Latest Ref Range: 8 - 23 mg/dL 76 (H)  Creatinine Latest Ref Range: 0.44 - 1.00 mg/dL 5.30 (H)  5  Ref. Range 07/12/2020 09:54  Hemoglobin Latest Ref Range: 12.0 - 15.0 g/dL 10.5 (L)  HCT Latest Ref Range: 36.0 - 46.0 % 31.0 (L)    I have reviewed the images obtained: NCT head showed small acute hemorrhage in the right basal ganglia in the region of the posterior putamen and posterior limb of the internal capsule. Chronic right cerebellar lacunar infarct.   Hemorrhage ~99mm x 69mm x 6 mm.   Assessment: [name] is a 79 y.o. female PMHx HTN, DM2, HLD, previous TIA now with small hemorrhagic stroke (ICH 0) in the basal ganglia extetending into right posterior limb internal capsule. She did not seem to have hearing loss and it is possible her visual deficits may be explained by optic radiation to striate passing through retrolenticular portion.  Impression:  Acute right basal ganglia hemorrhagic stroke. ICH score 0. Left lower extremity weakness. Left visual deficit (?). New onset A-fib. Active GI bleeding. HTN. HDL.   Plan: - Elevate head of bed. - MRI brain without contrast in the morning. - Recommend vascular imaging with MRA head and neck. - Recommend TTE. - Recommend labs: HbA1c, lipid  panel, TSH. - Recommend Statin if LDL > 70. - Hold antiplatelets/anticoagulants for 5-8 days then may consider AC if A fib confirmed. - SBP goal < 140 for now - Currently normotensive. - Telemetry monitoring for arrhythmia. - Recommend bedside Swallow screen. - Recommend Stroke education. - Recommend PT/OT/SLP consult. - In the event there is significant acute neurologic change STAT CT head non-contrast.  This patient is critically ill and at significant risk of neurological worsening, death and care requires constant monitoring of vital signs, hemodynamics,respiratory and cardiac monitoring, neurological assessment, discussion with family, other specialists and medical decision making of high complexity. I spent 73 minutes of neurocritical care time  in the care of  this patient. This was time spent independent of any time provided by nurse practitioner or PA.  Electronically signed by: Dr. Lynnae Sandhoff Pager: [phone] 07/12/2020, 11:05 AM

## 2020-07-12 NOTE — Consult Note (Signed)
UNASSIGNED PATIENT CROSS COVER [facility] Reason for Consult: Melenic stool anemia and altered mental status. Referring Physician: Dr. Darnelle Maffucci ED  [name] is an 79 y.o. female.  HPI: [name] is a 79 year old female who was admitted to the hospital as a code stroke.  She was diagnosed with Covid on 07/07/2020 and received the antibody infusion yesterday. She is not vaccinated.  At about 4 AM EMS was called as she had a left-sided facial droop and left-sided flaccid paralysis with large amount of black stools in her Depends. She also had some coffee-ground emesis but there is no history of hematemesis. In the ER she seemed to be in new onset atrial fibrillation. She has a history of end-stage renal disease and has been on dialysis with a last hemodialysis done yesterday.  In the mid emergency room she probably passed large amount of black stools on arrival but did not   have any hematochezia. Her granddaughter was helping take care of her called EMS for her as the patient was unable to take care of herself.  As per her granddaughter her patient's husband is also been hospitalized recently due to respiratory decompensation from Covid.  Patient had a CT scan of her head done on admission and was found to have a 7 mm hemorrhage in the right basal ganglia and was not a candidate for any intervention.  She was also complaining of right hip pain but the x-rays done did not reveal any evidence of fracture. There was a nail sticking out of the bone which could be causing the pain.  Her hemoglobin was 10.8 g/dL on admission with a platelet count of 123K.  Past Medical History:  Diagnosis Date  .  Lobular cancer of the right breast    History of CVA/left temporal lobe infarction    History of TIA    Chronic kidney disease on hemodialysis   .  Type II diabetes mellitus   . Hyperlipidemia/HTN    Past Surgical History:  Procedure Laterality Date  . BREAST LUMPECTOMY  03/26/2011   right   Family  History  Problem Relation Age of Onset  . Cancer Father        bronchial  . Cancer Paternal Aunt        breast  . Cancer Paternal Aunt        breast   Social History:  reports that she has never smoked. She has never used smokeless tobacco. She reports that she does not drink alcohol and does not use drugs.  Allergies: No Known Allergies  Medications: I have reviewed the patient's current medications.  Results for orders placed or performed during the hospital encounter of 07/12/20 (from the past 48 hour(s))  CBG monitoring, ED     Status: Abnormal   Collection Time: 07/12/20  9:45 AM  Result Value Ref Range   Glucose-Capillary 140 (H) 70 - 99 mg/dL    Comment: Glucose reference range applies only to samples taken after fasting for at least 8 hours.   Comment 1 Notify RN    Comment 2 Document in Chart   Protime-INR     Status: None   Collection Time: 07/12/20  9:47 AM  Result Value Ref Range   Prothrombin Time 13.5 11.4 - 15.2 seconds   INR 1.1 0.8 - 1.2    Comment: (NOTE) INR goal varies based on device and disease states. Performed at [facility], [place] [address] [place], [place] [address]   APTT  Status: None   Collection Time: 07/12/20  9:47 AM  Result Value Ref Range   aPTT 32 24 - 36 seconds    Comment: Performed at [facility] [address] [place], [place] [address]  CBC     Status: Abnormal   Collection Time: 07/12/20  9:47 AM  Result Value Ref Range   WBC 5.4 4.0 - 10.5 K/uL   RBC 3.72 (L) 3.87 - 5.11 MIL/uL   Hemoglobin 10.8 (L) 12.0 - 15.0 g/dL   HCT 35.4 (L) 36.0 - 46.0 %   MCV 95.2 80.0 - 100.0 fL   MCH 29.0 26.0 - 34.0 pg   MCHC 30.5 30.0 - 36.0 g/dL   RDW 18.2 (H) 11.5 - 15.5 %   Platelets 123 (L) 150 - 400 K/uL   nRBC 0.0 0.0 - 0.2 %    Comment: Performed at [facility], [place] [address] [place], [place] [address]  Differential     Status: Abnormal   Collection Time: 07/12/20  9:47 AM  Result Value Ref Range    Neutrophils Relative % 86 %   Neutro Abs 4.6 1.7 - 7.7 K/uL   Lymphocytes Relative 8 %   Lymphs Abs 0.4 (L) 0.7 - 4.0 K/uL   Monocytes Relative 5 %   Monocytes Absolute 0.3 0.1 - 1.0 K/uL   Eosinophils Relative 0 %   Eosinophils Absolute 0.0 0.0 - 0.5 K/uL   Basophils Relative 1 %   Basophils Absolute 0.1 0.0 - 0.1 K/uL   nRBC 2 (H) 0 /100 WBC   Abs Immature Granulocytes 0.00 0.00 - 0.07 K/uL    Comment: Performed at [facility] [address] [place], [place] [address]  Comprehensive metabolic panel     Status: Abnormal   Collection Time: 07/12/20  9:47 AM  Result Value Ref Range   Sodium 137 135 - 145 mmol/L   Potassium 3.9 3.5 - 5.1 mmol/L   Chloride 99 98 - 111 mmol/L   CO2 20 (L) 22 - 32 mmol/L   Glucose, Bld 154 (H) 70 - 99 mg/dL    Comment: Glucose reference range applies only to samples taken after fasting for at least 8 hours.   BUN 72 (H) 8 - 23 mg/dL   Creatinine, Ser 5.34 (H) 0.44 - 1.00 mg/dL   Calcium 8.1 (L) 8.9 - 10.3 mg/dL   Total Protein 6.1 (L) 6.5 - 8.1 g/dL   Albumin 2.7 (L) 3.5 - 5.0 g/dL   AST 51 (H) 15 - 41 U/L   ALT 25 0 - 44 U/L   Alkaline Phosphatase 51 38 - 126 U/L   Total Bilirubin 0.6 0.3 - 1.2 mg/dL   GFR, Estimated 8 (L) >60 mL/min    Comment: (NOTE) Calculated using the CKD-EPI Creatinine Equation (2021)    Anion gap 18 (H) 5 - 15    Comment: Performed at [facility], [place] [address] [place], [place] [address]  ABO/Rh     Status: None   Collection Time: 07/12/20  9:47 AM  Result Value Ref Range   ABO/RH(D)      O POS Performed at [facility] [address] [place],  [address]   I-stat chem 8, ED     Status: Abnormal   Collection Time: 07/12/20  9:54 AM  Result Value Ref Range   Sodium 136 135 - 145 mmol/L   Potassium 3.9 3.5 - 5.1 mmol/L   Chloride 103  98 - 111 mmol/L   BUN 76 (H) 8 - 23 mg/dL   Creatinine, Ser 5.30 (H) 0.44 - 1.00 mg/dL   Glucose, Bld 152 (H) 70 - 99 mg/dL    Comment: Glucose  reference range applies only to samples taken after fasting for at least 8 hours.   Calcium, Ion 0.95 (L) 1.15 - 1.40 mmol/L   TCO2 21 (L) 22 - 32 mmol/L   Hemoglobin 10.5 (L) 12.0 - 15.0 g/dL   HCT 31.0 (L) 36.0 - 46.0 %  Type and screen [facility]     Status: None (Preliminary result)   Collection Time: 07/12/20 10:24 AM  Result Value Ref Range   ABO/RH(D) O POS    Antibody Screen NEG    Sample Expiration 07/15/2020,2359    Unit Number [id number]    Blood Component Type RED CELLS,LR    Unit division 00    Status of Unit ISSUED    Transfusion Status OK TO TRANSFUSE    Crossmatch Result Compatible    Unit Number [id number]    Blood Component Type RED CELLS,LR    Unit division 00    Status of Unit ISSUED    Transfusion Status OK TO TRANSFUSE    Crossmatch Result      Compatible Performed at [facility], [place] [address] [place], [place] [address]   POC occult blood, ED Provider will collect     Status: Abnormal   Collection Time: 07/12/20 11:00 AM  Result Value Ref Range   Fecal Occult Bld POSITIVE (A) NEGATIVE  Prepare RBC (crossmatch)     Status: None   Collection Time: 07/12/20 12:03 PM  Result Value Ref Range   Order Confirmation      ORDER PROCESSED BY BLOOD BANK Performed at [facility], [place] [address], [place], [place] [address]   CBG monitoring, ED     Status: Abnormal   Collection Time: 07/12/20  3:01 PM  Result Value Ref Range   Glucose-Capillary 216 (H) 70 - 99 mg/dL    Comment: Glucose reference range applies only to samples taken after fasting for at least 8 hours.   DG Chest 1 View  Result Date: 07/12/2020 CLINICAL DATA:  Patient with history of COVID-19. History of GI bleed. EXAM: CHEST  1 VIEW COMPARISON:  Chest radiograph 07/10/2020. FINDINGS: Stable right-sided dialysis catheter. Monitoring leads overlie the patient. Stable cardiomegaly. Aortic atherosclerosis. Site oval increased patchy opacities left mid lower lung.  No definite pleural effusion or pneumothorax. Thoracic spine degenerative changes. Surgical clips right axilla. IMPRESSION: Increasing patchy opacities left mid and lower lung may represent infection in the appropriate clinical setting or potentially atelectasis. Electronically Signed   By: Lovey Newcomer M.D.   On: 07/12/2020 11:24   DG Pelvis Portable  Result Date: 07/12/2020 CLINICAL DATA:  Right hip pain.  No reported injury. EXAM: PORTABLE PELVIS 1-2 VIEWS COMPARISON:  07/21/2018 outside pelvic radiographs FINDINGS: Partially visualized left hip hemiarthroplasty and partially visualized fixation hardware in the proximal right femur with no evidence of hardware fracture or loosening. No evidence of hip dislocation on this single frontal view. No osseous fracture. No focal osseous lesions. Moderate right hip osteoarthritis. No suspicious focal osseous lesions. No pelvic diastasis. IMPRESSION: No acute osseous abnormality. No evidence of hip dislocation on this single frontal view. Moderate right hip osteoarthritis. Partially visualized left hip hemiarthroplasty and proximal right femur fixation hardware, with no evidence of hardware complication. Electronically Signed   By: Corene Cornea  A Poff M.D.   On: 07/12/2020 12:33   CT HEAD CODE STROKE WO CONTRAST  Addendum Date: 07/12/2020   ADDENDUM REPORT: 07/12/2020 10:16 ADDENDUM: Correction.  Findings were discussed with Dr. Theda Sers. Electronically Signed   By: Margaretha Sheffield MD   On: 07/12/2020 10:16   Result Date: 07/12/2020 CLINICAL DATA:  Code stroke.  Neuro deficit, acute stroke suspected. EXAM: CT HEAD WITHOUT CONTRAST TECHNIQUE: Contiguous axial images were obtained from the base of the skull through the vertex without intravenous contrast. COMPARISON:  MRI head August 17, 2016 FINDINGS: Brain: Acute hemorrhage in the right basal ganglia, centered in the region of the posterior putamen/posterior limb of the internal capsule. The hemorrhage measures  approximately 7 x 4 by 6 mm. No evidence of acute large vascular territory infarct. Patchy white matter hypoattenuation, which is nonspecific but most likely related to chronic microvascular ischemic disease. Mild generalized atrophy. No hydrocephalus. No evidence of a mass lesion. No midline shift. Remote right cerebellar lacunar infarct. Vascular: Calcific atherosclerosis. Skull: No acute fracture. Sinuses/Orbits: Sinuses are clear.  Unremarkable orbits. Other: No mastoid effusion. IMPRESSION: 1. Acute 7 mm hemorrhage within the right basal ganglia. No substantial mass effect. 2. Chronic microvascular ischemic disease and remote right cerebellar lacunar infarct. Code stroke imaging results were communicated on 07/12/2020 at 10:00 am to provider Dr. Lorrin Goodell via telephone, who verbally acknowledged these results. Electronically Signed: By: Margaretha Sheffield MD On: 07/12/2020 10:05   Review of Systems  Unable to perform ROS: Mental status change   Blood pressure 119/81, pulse (!) 103, temperature 98.5 F (36.9 C), temperature source Oral, resp. rate 16, SpO2 99 %. Physical Exam Constitutional:      General: She is in acute distress.     Appearance: She is ill-appearing and toxic-appearing. She is not diaphoretic.  HENT:     Head: Atraumatic.     Comments: Left-sided facial droop was noted Eyes:     Extraocular Movements: Extraocular movements intact.     Pupils: Pupils are equal, round, and reactive to light.  Cardiovascular:     Rate and Rhythm: Rhythm irregular.     Heart sounds: Normal heart sounds.  Abdominal:     General: Bowel sounds are normal. There is no distension.     Palpations: Abdomen is soft.     Tenderness: There is no abdominal tenderness.  Musculoskeletal:     Cervical back: Neck supple.  Skin:    General: Skin is warm and dry.  Neurological:     Mental Status: She is disoriented.   Assessment/Plan: 1) melenic stools with coffee-ground emesis and mild mild  anemia-patient's hemoglobin is down to 10.5 g/dL after hydration. We will monitor her closely and perform an EGD to further evaluate her symptoms when she is hemodynamically stable. 2) Stroke with basal ganglia hemorrhage. 3) New onset atrial fibrillation. 4) Recent diagnosis of Covid. 5) Chronic kidney disease with anemia. 6) Secondary hyperparathyroidism. 7) History of lobular breast cancer on the right side. 8) Type 2 diabetes mellitus. Juanita Craver 07/12/2020, 4:21 PM

## 2020-07-12 NOTE — Progress Notes (Addendum)
NAME:  [name], MRN:  [id number], DOB:  February 04, 1941, LOS: 0 ADMISSION DATE:  07/12/2020, CONSULTATION DATE:  07/12/2020 REFERRING MD:  Dr Gilford Raid, CHIEF COMPLAINT:  New stroke, GI Bleed, Recent Covid   Brief History:  Patient brought into the hospital for altered mentation Facial droop-last known normal at 0400 hrs. today Recent diagnosis of Covid, received monoclonal antibodies on 07/11/2020, not vaccinated New onset atrial fibrillation End-stage renal disease on dialysis-last dialysis 07/11/2020 Melanotic stool-GI bleed History of Present Illness:  Last known well at 0400 hrs.-did not have any focalities at that time according to documentation Activated EMS for melanotic stool, GI bleed Has had melanotic stools in the ED and small amount of coffee-ground emesis  Past Medical History:   Past Medical History:  Diagnosis Date  . Cancer ([place])   . Diabetes mellitus   . Hyperlipidemia    Significant Hospital Events:  Complain of pain and discomfort  Consults:  Neurology-neuro monitoring Gastroenterology-PPI  Procedures:  None  Significant Diagnostic Tests:  IMPRESSION: 1. Acute 7 mm hemorrhage within the right basal ganglia. No substantial mass effect. 2. Chronic microvascular ischemic disease and remote right cerebellar lacunar infarct.  Micro Data:  None 12/14-positive for Covid  Antimicrobials:  none  Interim History / Subjective:  Patient writhing in bed complaining of right hip pain-she does struggle with chronic pain, not on any medications chronically  Objective   Blood pressure 91/68, pulse (!) 102, resp. rate 18, SpO2 100 %.        Intake/Output Summary (Last 24 hours) at 07/12/2020 1310 Last data filed at 07/12/2020 1224 Gross per 24 hour  Intake 1101.52 ml  Output --  Net 1101.52 ml   There were no vitals filed for this visit.  Examination: General: Elderly lady, does not appear to be in distress HENT: Dry oral mucosa Lungs: Few rales  at the bases bilaterally Cardiovascular: S1-S2 appreciated Abdomen: Soft, bowel sounds appreciated Extremities: No clubbing, no edema Neuro: Alert and oriented to person, following commands, left lower extremity weakness GU: [facility] Problem list     Assessment & Plan:  Patient who was last known to be well at about 4 AM this morning who activated EMS for what was felt to be a GI bleed, noted facial droop with extremity weakness Evaluated in the ED, CT does show a basal ganglia hemorrhagic stroke New onset atrial fibrillation, borderline blood pressure, active GI bleed, recent Covid infection. With significant comorbidities and borderline blood pressure, patient will be admitted to the intensive care unit for close monitoring  GI bleed -GI consulted -Continue PPI -Continue blood transfusion -Continue hemodynamic monitoring  Acute right basal ganglia hemorrhagic stroke -Neurologically appears stable -Does have some focality of lower extremity weakness -Facial droop appears to have stabilized  Covid infection -Post monoclonal antibody  New onset atrial fibrillation -Rate is controlled at the present time -Cannot be anticoagulated with active GI bleed  End-stage renal disease -On dialysis -Last dialysis session 07/11/2020 -consult renal for dialysis scheduling  History of carcinoma of the right breast Type 2 diabetes -SSI  Risk of decompensation remains very high Patient is a full code Having active GI bleed at present New stroke New atrial fibrillation   Best practice (evaluated daily)  Diet: N.p.o. Pain/Anxiety/Delirium protocol (if indicated): Fentanyl as needed for hip pain VAP protocol (if indicated):  DVT prophylaxis: SCD GI prophylaxis: Protonix Glucose control: We will monitor Mobility: Bedrest Disposition: ICU  Goals of Care:  Last date of  multidisciplinary goals of care discussion: Family and staff present:  Summary of  discussion:  Follow up goals of care discussion due:  Code Status: Full code  Labs   CBC: Recent Labs  Lab 07/12/20 0947 07/12/20 0954  WBC 5.4  --   NEUTROABS 4.6  --   HGB 10.8* 10.5*  HCT 35.4* 31.0*  MCV 95.2  --   PLT 123*  --     Basic Metabolic Panel: Recent Labs  Lab 07/12/20 0947 07/12/20 0954  NA 137 136  K 3.9 3.9  CL 99 103  CO2 20*  --   GLUCOSE 154* 152*  BUN 72* 76*  CREATININE 5.34* 5.30*  CALCIUM 8.1*  --    GFR: CrCl cannot be calculated (Unknown ideal weight.). Recent Labs  Lab 07/12/20 0947  WBC 5.4    Liver Function Tests: Recent Labs  Lab 07/12/20 0947  AST 51*  ALT 25  ALKPHOS 51  BILITOT 0.6  PROT 6.1*  ALBUMIN 2.7*   No results for input(s): LIPASE, AMYLASE in the last 168 hours. No results for input(s): AMMONIA in the last 168 hours.  ABG    Component Value Date/Time   TCO2 21 (L) 07/12/2020 0954     Coagulation Profile: Recent Labs  Lab 07/12/20 0947  INR 1.1    Cardiac Enzymes: No results for input(s): CKTOTAL, CKMB, CKMBINDEX, TROPONINI in the last 168 hours.  HbA1C: No results found for: HGBA1C  CBG: Recent Labs  Lab 07/12/20 0945  GLUCAP 140*    Review of Systems:   Only complains of significant pain and discomfort in the right hip  Past Medical History:  She,  has a past medical history of Cancer ([place]), Diabetes mellitus, and Hyperlipidemia.   Surgical History:   Past Surgical History:  Procedure Laterality Date  . BREAST LUMPECTOMY  03/26/2011   right     Social History:   reports that she has never smoked. She has never used smokeless tobacco. She reports that she does not drink alcohol and does not use drugs.   Family History:  Her family history includes Cancer in her father, paternal aunt, and paternal aunt.   Allergies No Known Allergies   Home Medications  Prior to Admission medications   Medication Sig Start Date End Date Taking? Authorizing Provider  amLODipine (NORVASC) 5  MG tablet Take 5 mg by mouth daily.    [provider]  aspirin 325 MG tablet Take 325 mg by mouth daily.    [provider]  atorvastatin (LIPITOR) 40 MG tablet Take 20 mg by mouth daily.     [provider]  B Complex-C (SUPER B COMPLEX PO) Take 1 tablet by mouth every other day.     [provider]  Calcium Carbonate (CALTRATE 600 PO) Take 1 tablet by mouth every other day.     [provider]  Cholecalciferol (VITAMIN D) 2000 UNITS tablet Take 4,000 Units by mouth daily.    [provider]  furosemide (LASIX) 40 MG tablet Take 40 mg by mouth 2 (two) times daily.     [provider]  hydrALAZINE (APRESOLINE) 25 MG tablet Take 1 tablet (25 mg total) by mouth 2 (two) times daily. 04/20/19   Nicholas Lose, MD  Insulin Degludec-Liraglutide (XULTOPHY) 100-3.6 UNIT-MG/ML SOPN Inject 36 Units into the skin every morning.     [provider]  letrozole (FEMARA) 2.5 MG tablet Take 1 tablet (2.5 mg total) by mouth daily. 04/21/20  Nicholas Lose, MD  metoprolol succinate (TOPROL-XL) 100 MG 24 hr tablet Take 100 mg by mouth daily.  08/16/14   [provider]  potassium chloride SA (K-DUR) 20 MEQ tablet Take 1 tablet (20 mEq total) by mouth 2 (two) times daily. 04/20/19   Nicholas Lose, MD     The patient is critically ill with multiple organ systems failure and requires high complexity decision making for assessment and support, frequent evaluation and titration of therapies, application of advanced monitoring technologies and extensive interpretation of multiple databases. Critical Care Time devoted to patient care services described in this note independent of APP/resident time (if applicable)  is 50 minutes.   Sherrilyn Rist MD [facility] Personal pager: [phone] If unanswered, please page CCM On-call: [phone]

## 2020-07-12 NOTE — ED Provider Notes (Signed)
[facility] EMERGENCY DEPARTMENT Provider Note   CSN: [id number] Arrival date & time: 07/12/20  8786  An emergency department physician performed an initial assessment on this suspected stroke patient at 0945.  History Chief Complaint  Patient presents with  . Code Stroke  . GI Bleeding    [name] is a 79 y.o. female.  Pt presents to the ED today as a code stroke.  Pt was diagnosed with Covid on 12/13 due to a home exposure.  She has not been vaccinated.  She did have an antibody infusion yesterday.  This morning, around 0400, EMS was called to the house for a GI bleed.  She refused transport. (although daughter said EMS refused to take her)  She was normal neurologically then.  EMS was re-called out this am where they noticed that she had a left sided facial droop and left sided flaccid paralysis.  The pt also had a large amount of black stool in her depends.  She also looks to be in new onset atrial fib.  She does not have a hx of this per chart and is not on blood thinners.  Pt is also a dialysis patient and had dialysis yesterday.        Past Medical History:  Diagnosis Date  . Cancer ([place])   . Diabetes mellitus   . Hyperlipidemia     Patient Active Problem List   Diagnosis Date Noted  . CVA (cerebral vascular accident) ([place]) 07/12/2020  . Fall 07/06/2018  . Left temporal lobe infarction ([place]) 09/05/2017  . TIA (transient ischemic attack) 09/04/2017  . CKD (chronic kidney disease) stage 3, GFR 30-59 ml/min ([facility]) 09/03/2017  . Essential hypertension 09/03/2017  . Type 2 diabetes mellitus with stage 3 chronic kidney disease ([place]) 09/03/2017  . Lobular carcinoma of right breast ([place]) 03/01/2011    Past Surgical History:  Procedure Laterality Date  . BREAST LUMPECTOMY  03/26/2011   right     OB History   No obstetric history on file.     Family History  Problem Relation Age of Onset  . Cancer Father        bronchial  . Cancer Paternal  Aunt        breast  . Cancer Paternal Aunt        breast    Social History   Tobacco Use  . Smoking status: Never Smoker  . Smokeless tobacco: Never Used  Vaping Use  . Vaping Use: Never used  Substance Use Topics  . Alcohol use: No  . Drug use: No    Home Medications Prior to Admission medications   Medication Sig Start Date End Date Taking? Authorizing Provider  amLODipine (NORVASC) 5 MG tablet Take 5 mg by mouth daily.    [provider]  aspirin 325 MG tablet Take 325 mg by mouth daily.    [provider]  atorvastatin (LIPITOR) 40 MG tablet Take 20 mg by mouth daily.     [provider]  B Complex-C (SUPER B COMPLEX PO) Take 1 tablet by mouth every other day.     [provider]  Calcium Carbonate (CALTRATE 600 PO) Take 1 tablet by mouth every other day.     [provider]  Cholecalciferol (VITAMIN D) 2000 UNITS tablet Take 4,000 Units by mouth daily.    [provider]  furosemide (LASIX) 40 MG tablet Take 40 mg by mouth 2 (two) times daily.     [provider]  hydrALAZINE (APRESOLINE) 25 MG tablet Take 1 tablet (25 mg total) by mouth 2 (two) times daily. 04/20/19   Nicholas Lose, MD  Insulin Degludec-Liraglutide (XULTOPHY) 100-3.6 UNIT-MG/ML SOPN Inject 36 Units into the skin every morning.     [provider]  letrozole (FEMARA) 2.5 MG tablet Take 1 tablet (2.5 mg total) by mouth daily. 04/21/20   Nicholas Lose, MD  metoprolol succinate (TOPROL-XL) 100 MG 24 hr tablet Take 100 mg by mouth daily.  08/16/14   [provider]  potassium chloride SA (K-DUR) 20 MEQ tablet Take 1 tablet (20 mEq total) by mouth 2 (two) times daily. 04/20/19   Nicholas Lose, MD    Allergies    Patient has no known allergies.  Review of Systems   Review of Systems  Gastrointestinal: Positive for vomiting.       Black stool  All other systems reviewed and are negative.   Physical Exam Updated Vital Signs BP  110/60 (BP Location: Left Arm)   Pulse (!) 102   Temp 98.1 F (36.7 C) (Oral)   Resp 20   SpO2 99%   Physical Exam Vitals and nursing note reviewed.  Constitutional:      General: She is in acute distress.     Appearance: She is ill-appearing.  HENT:     Head: Atraumatic.     Comments: Left sided facial droop    Right Ear: External ear normal.     Left Ear: External ear normal.     Nose: Nose normal.     Mouth/Throat:     Mouth: Mucous membranes are moist.     Pharynx: Oropharynx is clear.  Eyes:     Extraocular Movements: Extraocular movements intact.     Conjunctiva/sclera: Conjunctivae normal.     Pupils: Pupils are equal, round, and reactive to light.  Cardiovascular:     Rate and Rhythm: Tachycardia present. Rhythm irregular.     Pulses: Normal pulses.     Heart sounds: Normal heart sounds.  Pulmonary:     Effort: Pulmonary effort is normal.     Breath sounds: Normal breath sounds.  Abdominal:     General: Abdomen is flat. Bowel sounds are normal.     Palpations: Abdomen is soft.  Musculoskeletal:        General: Normal range of motion.     Cervical back: Normal range of motion and neck supple.  Skin:    Capillary Refill: Capillary refill takes less than 2 seconds.     Coloration: Skin is pale.  Neurological:     Mental Status: She is alert.     Comments: Weakness on the left     ED Results / Procedures / Treatments   Labs (all labs ordered are listed, but only abnormal results are displayed) Labs Reviewed  CBC - Abnormal; Notable for the following components:      Result Value   RBC 3.72 (*)    Hemoglobin 10.8 (*)    HCT 35.4 (*)    RDW 18.2 (*)    Platelets 123 (*)    All other components within normal limits  DIFFERENTIAL - Abnormal; Notable for the following components:   Lymphs Abs 0.4 (*)    nRBC 2 (*)    All other components within normal limits  COMPREHENSIVE METABOLIC PANEL - Abnormal; Notable for the following components:   CO2 20 (*)     Glucose, Bld 154 (*)    BUN 72 (*)  Creatinine, Ser 5.34 (*)    Calcium 8.1 (*)    Total Protein 6.1 (*)    Albumin 2.7 (*)    AST 51 (*)    GFR, Estimated 8 (*)    Anion gap 18 (*)    All other components within normal limits  I-STAT CHEM 8, ED - Abnormal; Notable for the following components:   BUN 76 (*)    Creatinine, Ser 5.30 (*)    Glucose, Bld 152 (*)    Calcium, Ion 0.95 (*)    TCO2 21 (*)    Hemoglobin 10.5 (*)    HCT 31.0 (*)    All other components within normal limits  CBG MONITORING, ED - Abnormal; Notable for the following components:   Glucose-Capillary 140 (*)    All other components within normal limits  POC OCCULT BLOOD, ED - Abnormal; Notable for the following components:   Fecal Occult Bld POSITIVE (*)    All other components within normal limits  PROTIME-INR  APTT  HEMOGLOBIN A1C  TYPE AND SCREEN  ABO/RH  PREPARE RBC (CROSSMATCH)    EKG EKG Interpretation  Date/Time:  Saturday July 12 2020 10:28:15 EST Ventricular Rate:  100 PR Interval:    QRS Duration: 93 QT Interval:  383 QTC Calculation: 494 R Axis:   -18 Text Interpretation: Atrial fibrillation Ventricular premature complex Inferior infarct, old Lateral leads are also involved afib new Confirmed by Isla Pence [phone]) on 07/12/2020 10:38:11 AM   Radiology DG Chest 1 View  Result Date: 07/12/2020 CLINICAL DATA:  Patient with history of COVID-19. History of GI bleed. EXAM: CHEST  1 VIEW COMPARISON:  Chest radiograph 07/10/2020. FINDINGS: Stable right-sided dialysis catheter. Monitoring leads overlie the patient. Stable cardiomegaly. Aortic atherosclerosis. Site oval increased patchy opacities left mid lower lung. No definite pleural effusion or pneumothorax. Thoracic spine degenerative changes. Surgical clips right axilla. IMPRESSION: Increasing patchy opacities left mid and lower lung may represent infection in the appropriate clinical setting or potentially atelectasis.  Electronically Signed   By: Lovey Newcomer M.D.   On: 07/12/2020 11:24   DG Pelvis Portable  Result Date: 07/12/2020 CLINICAL DATA:  Right hip pain.  No reported injury. EXAM: PORTABLE PELVIS 1-2 VIEWS COMPARISON:  07/21/2018 outside pelvic radiographs FINDINGS: Partially visualized left hip hemiarthroplasty and partially visualized fixation hardware in the proximal right femur with no evidence of hardware fracture or loosening. No evidence of hip dislocation on this single frontal view. No osseous fracture. No focal osseous lesions. Moderate right hip osteoarthritis. No suspicious focal osseous lesions. No pelvic diastasis. IMPRESSION: No acute osseous abnormality. No evidence of hip dislocation on this single frontal view. Moderate right hip osteoarthritis. Partially visualized left hip hemiarthroplasty and proximal right femur fixation hardware, with no evidence of hardware complication. Electronically Signed   By: Ilona Sorrel M.D.   On: 07/12/2020 12:33   CT HEAD CODE STROKE WO CONTRAST  Addendum Date: 07/12/2020   ADDENDUM REPORT: 07/12/2020 10:16 ADDENDUM: Correction.  Findings were discussed with Dr. Theda Sers. Electronically Signed   By: Margaretha Sheffield MD   On: 07/12/2020 10:16   Result Date: 07/12/2020 CLINICAL DATA:  Code stroke.  Neuro deficit, acute stroke suspected. EXAM: CT HEAD WITHOUT CONTRAST TECHNIQUE: Contiguous axial images were obtained from the base of the skull through the vertex without intravenous contrast. COMPARISON:  MRI head August 17, 2016 FINDINGS: Brain: Acute hemorrhage in the right basal ganglia, centered in the region of the posterior putamen/posterior limb of the internal capsule. The  hemorrhage measures approximately 7 x 4 by 6 mm. No evidence of acute large vascular territory infarct. Patchy white matter hypoattenuation, which is nonspecific but most likely related to chronic microvascular ischemic disease. Mild generalized atrophy. No hydrocephalus. No evidence of  a mass lesion. No midline shift. Remote right cerebellar lacunar infarct. Vascular: Calcific atherosclerosis. Skull: No acute fracture. Sinuses/Orbits: Sinuses are clear.  Unremarkable orbits. Other: No mastoid effusion. IMPRESSION: 1. Acute 7 mm hemorrhage within the right basal ganglia. No substantial mass effect. 2. Chronic microvascular ischemic disease and remote right cerebellar lacunar infarct. Code stroke imaging results were communicated on 07/12/2020 at 10:00 am to provider Dr. Lorrin Goodell via telephone, who verbally acknowledged these results. Electronically Signed: By: Margaretha Sheffield MD On: 07/12/2020 10:05    Procedures Procedures (including critical care time)  Medications Ordered in ED Medications  0.9 %  sodium chloride infusion ( Intravenous New Bag/Given 07/12/20 1123)  pantoprazole (PROTONIX) 80 mg in sodium chloride 0.9 % 100 mL (0.8 mg/mL) infusion (8 mg/hr Intravenous New Bag/Given 07/12/20 1122)  docusate sodium (COLACE) capsule 100 mg (has no administration in time range)  polyethylene glycol (MIRALAX / GLYCOLAX) packet 17 g (has no administration in time range)  fentaNYL (SUBLIMAZE) injection 50 mcg (has no administration in time range)  insulin aspart (novoLOG) injection 0-6 Units (has no administration in time range)  sodium chloride flush (NS) 0.9 % injection 3 mL (3 mLs Intravenous Given 07/12/20 1037)  pantoprazole (PROTONIX) 80 mg in sodium chloride 0.9 % 100 mL IVPB (0 mg Intravenous Stopped 07/12/20 1121)  ondansetron (ZOFRAN) injection 4 mg (4 mg Intravenous Given 07/12/20 1037)  sodium chloride 0.9 % bolus 1,000 mL (1,000 mLs Intravenous New Bag/Given 07/12/20 1037)  fentaNYL (SUBLIMAZE) injection 25 mcg (25 mcg Intravenous Given 07/12/20 1126)  sodium chloride 0.9 % bolus 1,000 mL (0 mLs Intravenous Stopped 07/12/20 1224)  0.9 %  sodium chloride infusion (Manually program via Guardrails IV Fluids) ( Intravenous New Bag/Given 07/12/20 1220)    ED Course  I  have reviewed the triage vital signs and the nursing notes.  Pertinent labs & imaging results that were available during my care of the patient were reviewed by me and considered in my medical decision making (see chart for details).    MDM Rules/Calculators/A&P                          Pt met at the bridge by neurology.  Pt taken to the CT scanner and had a head CT which showed an acute 7 mm hemorrhage in the right basal ganglia.  She is not a candidate for tpa or for IR.  She was taken back to the room and noted to have black, melanotic stool pouring from her bottom.  She is also vomiting some coffee ground emesis.  BP in the upper 80s.  1L NS started as well as protonix.  BP up to 96.  Due to active bleeding, I ordered 2 units of blood.  Pt is in new onset afib with rvr.    CHA2DS2/VAS Stroke Risk Points:  7.  However, pt is not a candidate for anticoagulation due to GI bleed and ICH.  Pt's husband is in the hospital with Covid.  Her granddaughter has been helping pt out.  [phone]).  I put her number in the contacts.    Granddaughter said pt is a FULL CODE.  Pt d/w Dr. Collene Mares (GI).  She did come see pt and  recommended continuing on protonix and fluids.    Pt c/o right hip pain.  She has had prior surgery there.  No falls. No fx on xray.  Nail is sticking out of bone which could be causing the pain.  Pt given a small dose of fentanyl IV.  BP is marginal, so that is watched closely.  Pt is Covid +.  She had her mab infusion yesterday.  She is oxygenating well.    I have consulted critical care due to the multi organ system dysfunction.  They will admit due to the likelihood of decompensation.   [name] was evaluated in Emergency Department on 07/12/2020 for the symptoms described in the history of present illness. She was evaluated in the context of the global COVID-19 pandemic, which necessitated consideration that the patient might be at risk for infection with the  SARS-CoV-2 virus that causes COVID-19. Institutional protocols and algorithms that pertain to the evaluation of patients at risk for COVID-19 are in a state of rapid change based on information released by regulatory bodies including the CDC and federal and state organizations. These policies and algorithms were followed during the patient's care in the ED.  CRITICAL CARE Performed by: Isla Pence   Total critical care time: 75 minutes  Critical care time was exclusive of separately billable procedures and treating other patients.  Critical care was necessary to treat or prevent imminent or life-threatening deterioration.  Critical care was time spent personally by me on the following activities: development of treatment plan with patient and/or surrogate as well as nursing, discussions with consultants, evaluation of patient's response to treatment, examination of patient, obtaining history from patient or surrogate, ordering and performing treatments and interventions, ordering and review of laboratory studies, ordering and review of radiographic studies, pulse oximetry and re-evaluation of patient's condition.   Final Clinical Impression(s) / ED Diagnoses Final diagnoses:  GI bleed  Right-sided nontraumatic intracerebral hemorrhage, unspecified cerebral location ([place])  Upper GI bleed  Pneumonia due to COVID-19 virus  ESRD on hemodialysis ([place])  Atrial fibrillation with [facility])    Rx / DC Orders ED Discharge Orders    None       Isla Pence, MD 07/12/20 1339

## 2020-07-12 NOTE — ED Notes (Signed)
RN and this EMT cleaned pt up. Extremely large amounts of black stool/blood. Pt also had 2 episodes of emesis, also black/bloody in color. Pt continues to have bowel movements and complain of right hip pain.

## 2020-07-12 NOTE — ED Notes (Signed)
[name] (daughter#[phone])called for update on patient's status.  Thank you

## 2020-07-12 NOTE — ED Notes (Signed)
Report called to [facility] on [address] - He will call when a physical bed is in the ICU room

## 2020-07-12 NOTE — ED Notes (Signed)
Pt arrived via [facility] from home where she lives with family. Pt tested + for covid on Monday, has hx of dialysis last treatment was yesterday as well as had an antibody infusion for covid-19 yestersay. She has been having a GI bleed for 2 days per report with black tarry stools and emesis.   EMS was called to home prior to 0400 for GI bleed pt refused transport at that time, family called back due to worsening in GI bleed. On ems arrival around 0900 ems noted along with GI bleed pt also had developed a facial droop and left sided weakness. Pt remained alert and ox4. CODE STROKE was activated and she arrives to ED and met by code stroke team and EPD.

## 2020-07-12 NOTE — Telephone Encounter (Signed)
Granddaughter called to say grandmother has COVID, went for infusion Friday, then went for HD. States she is weak, not speaking correctly, unable to answer questions correctly.  Reason for Disposition . [1] Difficult to awaken or acting confused (e.g., disoriented, slurred speech) AND [2] present now AND [3] diabetes mellitus  Answer Assessment - Initial Assessment Questions 1. LEVEL OF CONSCIOUSNESS: "How is he (she, the patient) acting right now?" (e.g., alert-oriented, confused, lethargic, stuporous, comatose)     Answers questions incorrectly at times 2. ONSET: "When did the confusion start?"  (minutes, hours, days)     Last night after infusion then dialysis 3. PATTERN "Does this come and go, or has it been constant since it started?"  "Is it present now?"     New onset of confusion 4. ALCOHOL or DRUGS: "Has he been drinking alcohol or taking any drugs?"      no 5. NARCOTIC MEDICATIONS: "Has he been receiving any narcotic medications?" (e.g., morphine, Vicodin)     no 6. CAUSE: "What do you think is causing the confusion?"      Possibly dehydration 7. OTHER SYMPTOMS: "Are there any other symptoms?" (e.g., difficulty breathing, headache, fever, weakness)     Weak, slurred language, no one sided weakness  Protocols used: CONFUSION - DELIRIUM-A-AH  Had granddaughter check her CBG and oxygen level. CBG 126, O2 level 95. She is concerned because calling EMS they will only take her to [place] because that is where they are. (No HD at [facility]) Explained if she got there and needed care they were unable to provide they would transfer her to a higher level of care. She states she is going to call EMS now.

## 2020-07-13 ENCOUNTER — Inpatient Hospital Stay (HOSPITAL_COMMUNITY): Payer: Medicare HMO

## 2020-07-13 DIAGNOSIS — I610 Nontraumatic intracerebral hemorrhage in hemisphere, subcortical: Secondary | ICD-10-CM

## 2020-07-13 DIAGNOSIS — K921 Melena: Secondary | ICD-10-CM

## 2020-07-13 DIAGNOSIS — I480 Paroxysmal atrial fibrillation: Secondary | ICD-10-CM

## 2020-07-13 LAB — TYPE AND SCREEN
ABO/RH(D): O POS
Antibody Screen: NEGATIVE
Unit division: 0
Unit division: 0

## 2020-07-13 LAB — CBC
HCT: 34.8 % — ABNORMAL LOW (ref 36.0–46.0)
Hemoglobin: 11.4 g/dL — ABNORMAL LOW (ref 12.0–15.0)
MCH: 29.3 pg (ref 26.0–34.0)
MCHC: 32.8 g/dL (ref 30.0–36.0)
MCV: 89.5 fL (ref 80.0–100.0)
Platelets: 92 10*3/uL — ABNORMAL LOW (ref 150–400)
RBC: 3.89 MIL/uL (ref 3.87–5.11)
RDW: 17.6 % — ABNORMAL HIGH (ref 11.5–15.5)
WBC: 5.3 10*3/uL (ref 4.0–10.5)
nRBC: 0 % (ref 0.0–0.2)

## 2020-07-13 LAB — BASIC METABOLIC PANEL
Anion gap: 15 (ref 5–15)
BUN: 84 mg/dL — ABNORMAL HIGH (ref 8–23)
CO2: 17 mmol/L — ABNORMAL LOW (ref 22–32)
Calcium: 7.6 mg/dL — ABNORMAL LOW (ref 8.9–10.3)
Chloride: 105 mmol/L (ref 98–111)
Creatinine, Ser: 5.55 mg/dL — ABNORMAL HIGH (ref 0.44–1.00)
GFR, Estimated: 7 mL/min — ABNORMAL LOW (ref >60)
Glucose, Bld: 155 mg/dL — ABNORMAL HIGH (ref 70–99)
Potassium: 3.8 mmol/L (ref 3.5–5.1)
Sodium: 137 mmol/L (ref 135–145)

## 2020-07-13 LAB — GLUCOSE, CAPILLARY
Glucose-Capillary: 129 mg/dL — ABNORMAL HIGH (ref 70–99)
Glucose-Capillary: 153 mg/dL — ABNORMAL HIGH (ref 70–99)
Glucose-Capillary: 182 mg/dL — ABNORMAL HIGH (ref 70–99)
Glucose-Capillary: 144 mg/dL — ABNORMAL HIGH (ref 70–99)
Glucose-Capillary: 124 mg/dL — ABNORMAL HIGH (ref 70–99)

## 2020-07-13 LAB — BPAM RBC
Blood Product Expiration Date: 202112182359
Blood Product Expiration Date: 202201182359
ISSUE DATE / TIME: 202112181235
ISSUE DATE / TIME: 202112181516
Unit Type and Rh: 5100
Unit Type and Rh: 5100

## 2020-07-13 LAB — HEMOGLOBIN A1C
Hgb A1c MFr Bld: 7.9 % — ABNORMAL HIGH (ref 4.8–5.6)
Mean Plasma Glucose: 180.03 mg/dL

## 2020-07-13 MED ORDER — FENTANYL CITRATE (PF) 100 MCG/2ML IJ SOLN
INTRAMUSCULAR | Status: AC
  Administered 2020-07-13: 17:00:00 50 ug via INTRAVENOUS
  Filled 2020-07-13: qty 2

## 2020-07-13 MED ORDER — HYDRALAZINE HCL 20 MG/ML IJ SOLN
10.0000 mg | Freq: Four times a day (QID) | INTRAMUSCULAR | Status: DC | PRN
  Administered 2020-07-17: 19:00:00 10 mg via INTRAVENOUS
  Filled 2020-07-13: qty 1

## 2020-07-13 MED ORDER — CHLORHEXIDINE GLUCONATE CLOTH 2 % EX PADS
6.0000 | MEDICATED_PAD | Freq: Every day | CUTANEOUS | Status: DC
  Administered 2020-07-13 – 2020-07-30 (×18): 6 via TOPICAL

## 2020-07-13 MED ORDER — FENTANYL CITRATE (PF) 100 MCG/2ML IJ SOLN: 50.0000 ug | Freq: Once | INTRAMUSCULAR | Status: AC

## 2020-07-13 MED ORDER — SODIUM CHLORIDE 0.9 % IV SOLN: INTRAVENOUS | Status: DC | PRN

## 2020-07-13 MED ORDER — CHLORHEXIDINE GLUCONATE CLOTH 2 % EX PADS
6.0000 | MEDICATED_PAD | Freq: Every day | CUTANEOUS | Status: DC
  Administered 2020-07-14 – 2020-07-22 (×8): 6 via TOPICAL

## 2020-07-13 MED ORDER — METOPROLOL TARTRATE 5 MG/5ML IV SOLN: 2.5000 mg | Freq: Three times a day (TID) | INTRAVENOUS | Status: DC

## 2020-07-13 MED ORDER — FENTANYL CITRATE (PF) 100 MCG/2ML IJ SOLN
50.0000 ug | Freq: Once | INTRAMUSCULAR | Status: AC
  Administered 2020-07-13: 21:00:00 50 ug via INTRAVENOUS
  Filled 2020-07-13: qty 2

## 2020-07-13 MED ORDER — MAGNESIUM SULFATE 2 GM/50ML IV SOLN
2.0000 g | Freq: Once | INTRAVENOUS | Status: AC
  Administered 2020-07-13: 13:00:00 2 g via INTRAVENOUS
  Filled 2020-07-13: qty 50

## 2020-07-13 MED ORDER — MUPIROCIN 2 % EX OINT
1.0000 "application " | TOPICAL_OINTMENT | Freq: Two times a day (BID) | CUTANEOUS | Status: AC
  Administered 2020-07-13 – 2020-07-18 (×10): 1 via NASAL
  Filled 2020-07-13 (×6): qty 22

## 2020-07-13 MED ORDER — METOPROLOL TARTRATE 5 MG/5ML IV SOLN
2.5000 mg | Freq: Four times a day (QID) | INTRAVENOUS | Status: DC
  Administered 2020-07-13 – 2020-07-14 (×4): 2.5 mg via INTRAVENOUS
  Filled 2020-07-13 (×3): qty 5

## 2020-07-13 NOTE — Progress Notes (Signed)
[facility] Progress Note  Subjective: pt seen in room  Vitals:   07/13/20 0500 07/13/20 0600 07/13/20 0622 07/13/20 0810  BP: (!) 161/71 (!) 155/73    Pulse: 70 73 75   Resp: 18 18 15    Temp:    97.6 F (36.4 C)  TempSrc:    Oral  SpO2: 92% 93% 95%   Weight:        Exam:   alert, nad, obese   no jvd  Chest bilat rhonchi, coughing, o/w clear  Cor reg no RG  Abd soft ntnd no ascites   Ext trace - 1+ pretib edema   Alert, NF, ox3     R IJ TDC, clean and dry    OP HD: MWF [facility]   3.5h  (running 3hrs on COVID shift this week)    400/800  79kg  3K/2.25 bath  P2  Hep 3500  RIJ TDC/ no AVF    - calcitriol 0.5 ug tiw   Assessment/ Plan: 1.  Stroke - CT with basal ganglia hemorrhagic stroke.  Per neuro/admit 2. GI bleed - +FOBT, melena passed in ED, also ?hematemesis.  Hgb stable 10's. Previously on ESA and iron. GI consulting. 3. New onset fib - no known history.  Not on anticoagulation.  Rate controlled. per admit 4. Recent COVID Dx - diagnosed on 12/13 and received monoclonal antibodies 12/17.  Not vaccinated. Per admit 5.  ESRD -  On HD MWF.  Last HD Friday as outpatient on schedule.  No acute dialysis needs at this time. NO HEPARIN w/ possible GIB. Next HD Monday. 6.  Hypertension/volume  - Blood pressure elevated, 2kg under dry wt. Will lower vol further as tol on HD based on high BP's and some edema.  7.  Anemia of CKD - Hgb 10.5. Not currently on ESA or iron.  Follow trends.  8.  Secondary Hyperparathyroidism -  Ca at goal. Phos elevated as OP, continue binders and VDRA once eating.  9.  Nutrition - currently NPO 10. Hypokalemia - on 35mEq potassium daily.       Rob Shaughnessy Gethers 07/13/2020, 12:45 PM   Recent Labs  Lab 07/12/20 0954 07/12/20 2308 07/13/20 0412  K 3.9 3.9 3.8  BUN 76* 81* 84*  CREATININE 5.30* 5.59* 5.55*  CALCIUM  --  7.7* 7.6*  HGB 10.5*  --  11.4*   Inpatient medications: . Chlorhexidine Gluconate Cloth  6 each Topical Daily   . insulin aspart  0-6 Units Subcutaneous Q4H   . amiodarone 30 mg/hr (07/13/20 0323)  . magnesium sulfate bolus IVPB    . pantoprozole (PROTONIX) infusion 8 mg/hr (07/13/20 1033)   docusate sodium, hydrALAZINE, ondansetron (ZOFRAN) IV, polyethylene glycol

## 2020-07-13 NOTE — Progress Notes (Signed)
[facility] Progress Note Patient Name: MECHELE KITTLESON DOB: 07-Aug-1940 MRN: [id number]   Date of Service  07/13/2020  HPI/Events of Note  Bilious vomiting - Zofran X 2 not effective. Nursing requests NGT to LIS.  eICU Interventions  Plan: 1. NGT to LIS. 2. Portable abdominal film post NGT placement.      Intervention Category Major Interventions: Other:  Lysle Dingwall 07/13/2020, 4:34 AM

## 2020-07-13 NOTE — Progress Notes (Signed)
UNASSIGNED PATIENT CROSS COVER [facility] Subjective: [name] is a 79 year old female who was referred to the hospital with a code stroke yesterday and found was found to have a basal ganglion hemorrhagic stroke along with melenic stools and new onset atrial fibrillation with rapid ventricular response.  She is done fairly well since admission and seems to have stabilized.  Her hemoglobin is stable she has had some nausea and vomiting through the night and had an NG tube placed present no problems thereafter.  There is no further evidence of ongoing GI bleeding.  She received 2 units of blood yesterday.  She is on dialysis for end-stage renal disease 3 times a week.   Objective: Vital signs in last 24 hours: Temp:  [97.6 F (36.4 C)-98.6 F (37 C)] 97.6 F (36.4 C) (12/19 1210) Pulse Rate:  [70-125] 75 (12/19 0622) Resp:  [11-25] 15 (12/19 0622) BP: (134-179)/(63-106) 155/73 (12/19 0600) SpO2:  [88 %-95 %] 95 % (12/19 0622) Weight:  [77 kg] 77 kg (12/19 0427) Last BM Date: 07/13/20  Intake/Output from previous day: 12/18 0701 - 12/19 0700 In: 5399.6 [I.V.:2634.1; RSWNI:627; IV Piggyback:2101.5] Out: 300 [Urine:300] Intake/Output this shift: No intake/output data recorded.  I did not examine the patient today due to her Covid status this was discussed with Dr. Shearon Stalls during rounds. Lab Results: Recent Labs    07/12/20 0947 07/12/20 0954 07/13/20 0412  WBC 5.4  --  5.3  HGB 10.8* 10.5* 11.4*  HCT 35.4* 31.0* 34.8*  PLT 123*  --  92*   BMET Recent Labs    07/12/20 0947 07/12/20 0954 07/12/20 2308 07/13/20 0412  NA 137 136 139 137  K 3.9 3.9 3.9 3.8  CL 99 103 105 105  CO2 20*  --  19* 17*  GLUCOSE 154* 152* 143* 155*  BUN 72* 76* 81* 84*  CREATININE 5.34* 5.30* 5.59* 5.55*  CALCIUM 8.1*  --  7.7* 7.6*   LFT Recent Labs    07/12/20 0947  PROT 6.1*  ALBUMIN 2.7*  AST 51*  ALT 25  ALKPHOS 51  BILITOT 0.6   PT/INR Recent Labs    07/12/20 0947  LABPROT 13.5   INR 1.1   HStudies/Results: DG Chest 1 View  Result Date: 07/12/2020 CLINICAL DATA:  Patient with history of COVID-19. History of GI bleed. EXAM: CHEST  1 VIEW COMPARISON:  Chest radiograph 07/10/2020. FINDINGS: Stable right-sided dialysis catheter. Monitoring leads overlie the patient. Stable cardiomegaly. Aortic atherosclerosis. Site oval increased patchy opacities left mid lower lung. No definite pleural effusion or pneumothorax. Thoracic spine degenerative changes. Surgical clips right axilla. IMPRESSION: Increasing patchy opacities left mid and lower lung may represent infection in the appropriate clinical setting or potentially atelectasis. Electronically Signed   By: Lovey Newcomer M.D.   On: 07/12/2020 11:24   DG Pelvis Portable  Result Date: 07/12/2020 CLINICAL DATA:  Right hip pain.  No reported injury. EXAM: PORTABLE PELVIS 1-2 VIEWS COMPARISON:  07/21/2018 outside pelvic radiographs FINDINGS: Partially visualized left hip hemiarthroplasty and partially visualized fixation hardware in the proximal right femur with no evidence of hardware fracture or loosening. No evidence of hip dislocation on this single frontal view. No osseous fracture. No focal osseous lesions. Moderate right hip osteoarthritis. No suspicious focal osseous lesions. No pelvic diastasis. IMPRESSION: No acute osseous abnormality. No evidence of hip dislocation on this single frontal view. Moderate right hip osteoarthritis. Partially visualized left hip hemiarthroplasty and proximal right femur fixation hardware, with no evidence of hardware complication. Electronically  Signed   By: Ilona Sorrel M.D.   On: 07/12/2020 12:33   DG Abd Portable 1V  Result Date: 07/13/2020 CLINICAL DATA:  Evaluate orogastric tube EXAM: PORTABLE ABDOMEN - 1 VIEW COMPARISON:  July 13, 2020 FINDINGS: The OG tube side port and distal tip are in the region the stomach. IMPRESSION: The OG tube is in good position. Electronically Signed   By: Dorise Bullion III M.D   On: 07/13/2020 11:04   DG Abd Portable 1V  Result Date: 07/13/2020 CLINICAL DATA:  Vomiting. EXAM: PORTABLE ABDOMEN - 1 VIEW COMPARISON:  None. FINDINGS: No evidence of dilated bowel loops. Aortic atherosclerotic calcification noted. Compression screw noted in the right hip. Left hip prosthesis is also seen. IMPRESSION: Unremarkable bowel gas pattern.  No acute findings. Electronically Signed   By: Marlaine Hind M.D.   On: 07/13/2020 05:08   CT HEAD CODE STROKE WO CONTRAST  Addendum Date: 07/12/2020   ADDENDUM REPORT: 07/12/2020 10:16 ADDENDUM: Correction.  Findings were discussed with Dr. Theda Sers. Electronically Signed   By: Margaretha Sheffield MD   On: 07/12/2020 10:16   Result Date: 07/12/2020 CLINICAL DATA:  Code stroke.  Neuro deficit, acute stroke suspected. EXAM: CT HEAD WITHOUT CONTRAST TECHNIQUE: Contiguous axial images were obtained from the base of the skull through the vertex without intravenous contrast. COMPARISON:  MRI head August 17, 2016 FINDINGS: Brain: Acute hemorrhage in the right basal ganglia, centered in the region of the posterior putamen/posterior limb of the internal capsule. The hemorrhage measures approximately 7 x 4 by 6 mm. No evidence of acute large vascular territory infarct. Patchy white matter hypoattenuation, which is nonspecific but most likely related to chronic microvascular ischemic disease. Mild generalized atrophy. No hydrocephalus. No evidence of a mass lesion. No midline shift. Remote right cerebellar lacunar infarct. Vascular: Calcific atherosclerosis. Skull: No acute fracture. Sinuses/Orbits: Sinuses are clear.  Unremarkable orbits. Other: No mastoid effusion. IMPRESSION: 1. Acute 7 mm hemorrhage within the right basal ganglia. No substantial mass effect. 2. Chronic microvascular ischemic disease and remote right cerebellar lacunar infarct. Code stroke imaging results were communicated on 07/12/2020 at 10:00 am to provider Dr. Lorrin Goodell via  telephone, who verbally acknowledged these results. Electronically Signed: By: Margaretha Sheffield MD On: 07/12/2020 10:05   Medications: I have reviewed the patient's current medications.  Assessment/Plan: 1) Melenic stools with anemia status post 2 units of packed red blood cells hemoglobin stable at 11.4 g/dL I think the patient is stable enough to have the NG tube removed at this present time. The timing of the GI work-up can be decided over the next day or so by Dr. Nicki Guadalajara, from [facility] who will assume care tomorrow. 2) End-stage renal disease/anemia of chronic disease. 3) Basal ganglia hemorrhagic stroke. 4) New onset atrial fibrillation-currently in sinus rhythm  LOS: 1 day   Juanita Craver 07/13/2020, 5:55 PM

## 2020-07-13 NOTE — Progress Notes (Signed)
[place] Progress Note Patient Name: [name] DOB: 08/06/1940 MRN: [id number]   Date of Service  07/13/2020  HPI/Events of Note  Patient c/o L hip pain. Patient is NPO.   eICU Interventions  Plan: 1. Fentanyl 50 mcg IV X 1 now.      Intervention Category Major Interventions: Other:  Lysle Dingwall 07/13/2020, 8:39 PM

## 2020-07-13 NOTE — Progress Notes (Addendum)
NAME:  [name], MRN:  [id number], DOB:  11-Jul-1941, LOS: 1 ADMISSION DATE:  07/12/2020, CONSULTATION DATE:  07/12/2020 REFERRING MD:  Dr Gilford Raid, CHIEF COMPLAINT:  New stroke, GI Bleed, Recent Covid   Brief History:  Patient brought into the hospital for altered mentation Facial droop-last known normal at 0400 hrs. today Recent diagnosis of Covid 12/13, received monoclonal antibodies on 07/11/2020, not vaccinated New onset atrial fibrillation End-stage renal disease on dialysis-last dialysis 07/11/2020 Melanotic stool and coffee ground emesis-GI bleed  History of Present Illness:  Last known well at 0400 hrs.-did not have any focalities at that time according to documentation Activated EMS for melanotic stool, GI bleed Has had melanotic stools in the ED and small amount of coffee-ground emesis  Past Medical History:   Past Medical History:  Diagnosis Date  . Cancer ([place])   . Diabetes mellitus   . Hyperlipidemia    Not vaccinated [facility] Events:  12/18 admitted   Consults:  Neurology Gastroenterology Nephrology   Procedures:  12/19 NGT >>  Significant Diagnostic Tests:  [facility] 12/18 >> 1. Acute 7 mm hemorrhage within the right basal ganglia. No substantial mass effect. 2. Chronic microvascular ischemic disease and remote right cerebellar lacunar infarct.  12/18 pelvis XR >> No acute osseous abnormality. No evidence of hip dislocation on this single frontal view. Moderate right hip osteoarthritis. Partially visualized left hip hemiarthroplasty and proximal right femur fixation hardware, with no evidence of hardware complication.  12/19 KUB >> neg  Micro Data:  12/14-positive for Covid  Antimicrobials:  none  Interim History / Subjective:  Afebrile/ normal WBC afib with RVR overnight placed on amiodarone-> since converted to NSR Refractory nausea and bilious vomiting overnight despite zofran, NGT placed ~67ml output, KUB neg No  further melena  Hgb 10.5-> 11.4, plts 123-> 92 Received multiple doses of fentanyl overnight for right hip  Objective   Blood pressure (!) 155/73, pulse 75, temperature 97.6 F (36.4 C), temperature source Oral, resp. rate 15, weight 77 kg, SpO2 95 %.        Intake/Output Summary (Last 24 hours) at 07/13/2020 1113 Last data filed at 07/13/2020 0600 Gross per 24 hour  Intake 5399.63 ml  Output 300 ml  Net 5099.63 ml   Filed Weights   07/13/20 0427  Weight: 77 kg   Examination: General:  Chronically ill appearing pleasant elderly female lying in bed in NAD HEENT: MM pink/moist, left NGT-> clamped/ possible blood tinged clear output, pupils 4/reactive, speech clear, no facial asymmetry  Neuro: Awake, oriented, could not appreciate any focal/ sensory deficits  CV: rr, NSR, no murmur, R TDC PULM:  Non labored, on 5L [place], scattered rhonchi- clears with cough GI: obese, soft, bs+, purwick   Extremities: warm/dry, no LE edema  Skin: no rashes   +5L./ wt 77 kg (EDW 79 kg)  Resolved Hospital Problem list     Assessment & Plan:  Patient who was last known to be well at about 4 AM 12/18 who activated EMS for what was felt to be a GI bleed, noted facial droop with extremity weakness Evaluated in the ED, CT showed a basal ganglia hemorrhagic stroke New onset atrial fibrillation, borderline blood pressure, active GI bleed, recent Covid infection dx 12/13.  With significant comorbidities and borderline blood pressure, patient will be admitted to the intensive care unit for close monitoring  GI bleed - per GI following - s/p 2 units PRBC 12/18 - suspect EGD during hospitalization - PPI  gtt per GI - transfuse for Hgb < 7 - remains hemodynamic stable/ slightly hypertensive - stop IVF - NGT clamped-> not clear she needs this  Acute right basal ganglia hemorrhagic stroke - per Neuro - SBP goal <140, may liberate to goal < 160 - repeat CTH today  - remaining stroke workup per neuro -  ongoing neuro exams  - PT/ OT/ SLP - no current antiplatelets/anticoagulants for now with GIB  Covid infection - Post monoclonal antibody - CXR 12/18 patchy left infiltrate vs atelectasis  - continue supplemental O2 as need for sat goal > 92%  - aggressive pulmonary hygiene  New onset atrial fibrillation - converted back to NSR - continue amio gtt for now - continue tele monitoring  - goal K > 4; Mag > 2- Mag 2 gm now - No anticoagulation with GIB/ ICH  End-stage renal disease - On HD MWF, last dialysis 12/17, next iHD scheduled for 12/20 - stop IVF - no current indications for emergent iHD  History of carcinoma of the right breast - f/u outpt  Type 2 diabetes -SSI, currently within goal   Hip pain, right  - d/c fentanyl for now to avoid over sedation  - pelvis XR for moderate right hip osteoarthritis/ no acute abnormality; previous left hip hemiarthroplasty and proximal right femur fixation hardware, with no evidence of hardware complication   Risk of decompensation remains very high.  Will continue close monitoring in ICU for now.   Best practice (evaluated daily)  Diet: NPO Pain/Anxiety/Delirium protocol (if indicated): n/a VAP protocol (if indicated): n/a DVT prophylaxis: SCD only  GI prophylaxis: Protonix gtt Glucose control: We will monitor Mobility: Bedrest Disposition: ICU  Goals of Care:  Code Status: Full code.  12/19, patient states she wants everything done for her.  Her husband is currently hospitalized at [facility] with [place].  Ask we call and update her granddaughter, [name] [phone].  Labs   CBC: Recent Labs  Lab 07/12/20 0947 07/12/20 0954 07/13/20 0412  WBC 5.4  --  5.3  NEUTROABS 4.6  --   --   HGB 10.8* 10.5* 11.4*  HCT 35.4* 31.0* 34.8*  MCV 95.2  --  89.5  PLT 123*  --  92*    Basic Metabolic Panel: Recent Labs  Lab 07/12/20 0947 07/12/20 0954 07/12/20 2308 07/13/20 0412  NA 137 136 139 137  K 3.9 3.9 3.9 3.8  CL 99 103  105 105  CO2 20*  --  19* 17*  GLUCOSE 154* 152* 143* 155*  BUN 72* 76* 81* 84*  CREATININE 5.34* 5.30* 5.59* 5.55*  CALCIUM 8.1*  --  7.7* 7.6*  MG  --   --  1.6*  --    GFR: Estimated Creatinine Clearance: 8.4 mL/min (A) (by C-G formula based on SCr of 5.55 mg/dL (H)). Recent Labs  Lab 07/12/20 0947 07/13/20 0412  WBC 5.4 5.3    Liver Function Tests: Recent Labs  Lab 07/12/20 0947  AST 51*  ALT 25  ALKPHOS 51  BILITOT 0.6  PROT 6.1*  ALBUMIN 2.7*   No results for input(s): LIPASE, AMYLASE in the last 168 hours. No results for input(s): AMMONIA in the last 168 hours.  ABG    Component Value Date/Time   TCO2 21 (L) 07/12/2020 0954     Coagulation Profile: Recent Labs  Lab 07/12/20 0947  INR 1.1    Cardiac Enzymes: No results for input(s): CKTOTAL, CKMB, CKMBINDEX, TROPONINI in the last 168 hours.  HbA1C: Hgb  A1c MFr Bld  Date/Time Value Ref Range Status  07/13/2020 04:12 AM 7.9 (H) 4.8 - 5.6 % Final    Comment:    (NOTE) Pre diabetes:          5.7%-6.4%  Diabetes:              >6.4%  Glycemic control for   <7.0% adults with diabetes     CBG: Recent Labs  Lab 07/12/20 1501 07/12/20 1855 07/12/20 2352 07/13/20 0306 07/13/20 0812  GLUCAP 216* 160* 120* 129* 153*   CCT 30 mins  Kennieth Rad, ACNP [facility] 07/13/2020, 11:13 AM

## 2020-07-13 NOTE — Progress Notes (Signed)
STROKE TEAM PROGRESS NOTE   HISTORY OF PRESENT ILLNESS (per record) [name] is a 78 y.o. female with hx of breast cancer, DM, ESRD on HD, HLD and ~0400 EMS called to house for GI bleed was neurologically intact and she refused transport. EMS was called again where they noticed a left sided facial droop and left leg weakness with associated decreased sensation and new onset atrial fib.  Currently complaining severe pain in her back and legs. - Diagnosed with Covid on 12/13 due to a home exposure - Not vaccinated.  - Had dialysis yesterday. Had antibody infusion yesterday.  LKW: 0400 tpa given?: No, active GI bleed IR Thrombectomy? No, LVO Modified Rankin Scale: 0-Completely asymptomatic and back to baseline post- stroke NIHSS 8   INTERVAL HISTORY Her family is not at bedside. Patient is Covid positive. Discussed stroke and further workup.     OBJECTIVE Vitals:   07/13/20 0500 07/13/20 0600 07/13/20 0622 07/13/20 0810  BP: (!) 161/71 (!) 155/73    Pulse: 70 73 75   Resp: 18 18 15    Temp:    97.6 F (36.4 C)  TempSrc:    Oral  SpO2: 92% 93% 95%   Weight:        CBC:  Recent Labs  Lab 07/12/20 0947 07/12/20 0954 07/13/20 0412  WBC 5.4  --  5.3  NEUTROABS 4.6  --   --   HGB 10.8* 10.5* 11.4*  HCT 35.4* 31.0* 34.8*  MCV 95.2  --  89.5  PLT 123*  --  92*    Basic Metabolic Panel:  Recent Labs  Lab 07/12/20 2308 07/13/20 0412  NA 139 137  K 3.9 3.8  CL 105 105  CO2 19* 17*  GLUCOSE 143* 155*  BUN 81* 84*  CREATININE 5.59* 5.55*  CALCIUM 7.7* 7.6*  MG 1.6*  --     Lipid Panel: No results found for: CHOL, TRIG, HDL, CHOLHDL, VLDL, LDLCALC HgbA1c:  Lab Results  Component Value Date   HGBA1C 7.9 (H) 07/13/2020   Urine Drug Screen: No results found for: LABOPIA, COCAINSCRNUR, LABBENZ, AMPHETMU, THCU, LABBARB  Alcohol Level No results found for: [facility]  IMAGING  DG Chest 1 View 07/12/2020 IMPRESSION:  Increasing patchy opacities left mid and lower  lung may represent infection in the appropriate clinical setting or potentially atelectasis.   DG Pelvis Portable 07/12/2020 IMPRESSION:  No acute osseous abnormality. No evidence of hip dislocation on this single frontal view. Moderate right hip osteoarthritis. Partially visualized left hip hemiarthroplasty and proximal right femur fixation hardware, with no evidence of hardware complication.    DG Abd Portable 1V 07/13/2020 IMPRESSION:  Unremarkable bowel gas pattern.  No acute findings.   CT HEAD CODE STROKE WO CONTRAST 07/12/2020   IMPRESSION:  1. Acute 7 mm hemorrhage within the right basal ganglia. No substantial mass effect.  2. Chronic microvascular ischemic disease and remote right cerebellar lacunar infarct.   CT HEAD WO CONTRAST - pending  MRI / MRA Head - pending  Transthoracic Echocardiogram - pending 00/00/2021  Bilateral Carotid Dopplers - pending 00/00/2021   ECG - atrial fibrillation - ventricular response 100 BPM (See cardiology reading for complete details)    PHYSICAL EXAM Blood pressure (!) 155/73, pulse 75, temperature 97.6 F (36.4 C), temperature source Oral, resp. rate 15, weight 77 kg, SpO2 95 %.   Exam: NAD, pleasant                  Speech:  Speech is normal; fluent and spontaneous, no aphasia or dysarthria Cognition:    The patient follows simple commands, may have some baseline memory deficits   Cranial Nerves:    The pupils are equal, round, and reactive to light .EOMI. Does not blink on the left but blinks to threat on the right. Trigeminal sensation is intact and the muscles of mastication are normal. Mild left lower facial droop. The palate elevates in the midline. Hearing intact to voice. Voice is normal. Shoulder shrug is normal. The tongue has normal motion without fasciculations.   Coordination:  No dysmetria or ataxia  Motor Observation:    No asymmetry, no atrophy, and no involuntary movements noted. Tone:    Normal muscle  tone and bulk.     Strength:    Strength is V/V right, left arm 4+/5, left leg 2/5.     Sensation: left hemisensory loss    ASSESSMENT/PLAN [name] is a 79 y.o. female with history of breast cancer, DM, HLD, Covid 07/07/20, ESRD on HD (Dialysis with antibody infusion 12/17), with recent back and leg pain.  On 12/18 at ~0400 EMS called to house for GI bleed (2 days of black tarry stools and emesis) was neurologically intact and she refused transport. EMS was called again where they noticed a left sided facial droop and left leg weakness with associated decreased sensation and new onset atrial fib.  She did not receive IV t-PA due to Gi bleed.  ICH: Acute 7 mm hemorrhage within the right basal ganglia, unclear etiology work up underway.  Does not appear to be hypertensive.  Code Stroke CT Head - Acute 7 mm hemorrhage within the right basal ganglia. No substantial mass effect. Chronic microvascular ischemic disease and remote right cerebellar lacunar infarct.   CT head - repeat today 12/19  MRI head - pending  MRA head - pending  CTA H&N - not ordered  CT Perfusion - not ordered  Carotid Doppler - pending  2D Echo - pending  Sars Corona Virus 2 - not ordered - recently diagnosed as positive 07/07/20  LDL - pending  HgbA1c - 7.9  UDS - not ordered  VTE prophylaxis - SCDs Diet  Diet Order            Diet NPO time specified  Diet effective now                 No antithrombotic prior to admission, now on No antithrombotic  Ongoing aggressive stroke risk factor management  Therapy recommendations:  pending  Disposition:  Pending  Hypertension  Home BP meds: Norvasc ; Hydralazine ; Toprol  Current BP meds: none - will order prn medication for BP (Hydralazine - nationwide shortage but possible AV block and sinus arrest with Labetalol)  BP mildly high . SBP goal < 140 mm Hg for first 24 hours then reassess based on imaging and pt's condition (CT head  pending) . Long-term BP goal normotensive  Hyperlipidemia  Home Lipid lowering medication:  Lipitor 20 mg daily  LDL pending, goal < 70  Current lipid lowering medication: none / NPO  Continue statin at discharge  Diabetes  Home diabetic meds: Glucotrol and insulin  Current diabetic meds: SSI   HgbA1c 7.9, goal < 7.0 Recent Labs    07/12/20 2352 07/13/20 0306 07/13/20 0812  GLUCAP 120* 129* 153*    Other Stroke Risk Factors  Advanced age  Obesity, Body mass index is 29.14 kg/m., recommend weight loss, diet and  exercise as appropriate   Other Active Problems, Findings and Recommendations  Code status - Full code  Covid dx 12/13 (Zithromax and Decadron PTA)  Newly diagnosed atrial fibrillation - amiodarone  Gi bleed [facility] day # 1  Personally examined patient and images, and have participated in and made any corrections needed to history, physical, neuro exam,assessment and plan as stated above.  I have personally obtained the history, evaluated lab date, reviewed imaging studies and agree with radiology interpretations.    Sarina Ill, MD Stroke Neurology  I spent 35 minutes of face-to-face and non-face-to-face time with patient. This included prechart review, lab review, study review, order entry, electronic health record documentation, patient education on the different diagnostic and therapeutic options, counseling and coordination of care, risks and benefits of management, compliance, or risk factor reduction   To contact Stroke Continuity provider, please refer to [email or web address] After hours, contact General Neurology

## 2020-07-13 NOTE — H&P (Signed)
Refer to note 07/12/2020 1247hrs

## 2020-07-14 ENCOUNTER — Inpatient Hospital Stay (HOSPITAL_COMMUNITY): Payer: Medicare HMO

## 2020-07-14 DIAGNOSIS — U071 COVID-19: Secondary | ICD-10-CM

## 2020-07-14 DIAGNOSIS — I4891 Unspecified atrial fibrillation: Secondary | ICD-10-CM

## 2020-07-14 DIAGNOSIS — J1282 Pneumonia due to coronavirus disease 2019: Secondary | ICD-10-CM

## 2020-07-14 DIAGNOSIS — K922 Gastrointestinal hemorrhage, unspecified: Secondary | ICD-10-CM

## 2020-07-14 DIAGNOSIS — I619 Nontraumatic intracerebral hemorrhage, unspecified: Secondary | ICD-10-CM

## 2020-07-14 DIAGNOSIS — I629 Nontraumatic intracranial hemorrhage, unspecified: Secondary | ICD-10-CM

## 2020-07-14 DIAGNOSIS — I6389 Other cerebral infarction: Secondary | ICD-10-CM

## 2020-07-14 DIAGNOSIS — I340 Nonrheumatic mitral (valve) insufficiency: Secondary | ICD-10-CM

## 2020-07-14 LAB — CBC
HCT: 38.4 % (ref 36.0–46.0)
Hemoglobin: 12.2 g/dL (ref 12.0–15.0)
MCH: 28.8 pg (ref 26.0–34.0)
MCHC: 31.8 g/dL (ref 30.0–36.0)
MCV: 90.8 fL (ref 80.0–100.0)
Platelets: 112 10*3/uL — ABNORMAL LOW (ref 150–400)
RBC: 4.23 MIL/uL (ref 3.87–5.11)
RDW: 17.9 % — ABNORMAL HIGH (ref 11.5–15.5)
WBC: 7.9 10*3/uL (ref 4.0–10.5)
nRBC: 0 % (ref 0.0–0.2)

## 2020-07-14 LAB — RENAL FUNCTION PANEL
Albumin: 2.4 g/dL — ABNORMAL LOW (ref 3.5–5.0)
Anion gap: 20 — ABNORMAL HIGH (ref 5–15)
BUN: 90 mg/dL — ABNORMAL HIGH (ref 8–23)
CO2: 16 mmol/L — ABNORMAL LOW (ref 22–32)
Calcium: 8 mg/dL — ABNORMAL LOW (ref 8.9–10.3)
Chloride: 105 mmol/L (ref 98–111)
Creatinine, Ser: 6.73 mg/dL — ABNORMAL HIGH (ref 0.44–1.00)
GFR, Estimated: 6 mL/min — ABNORMAL LOW (ref >60)
Glucose, Bld: 122 mg/dL — ABNORMAL HIGH (ref 70–99)
Phosphorus: 8.6 mg/dL — ABNORMAL HIGH (ref 2.5–4.6)
Potassium: 4.3 mmol/L (ref 3.5–5.1)
Sodium: 141 mmol/L (ref 135–145)

## 2020-07-14 LAB — GLUCOSE, CAPILLARY
Glucose-Capillary: 100 mg/dL — ABNORMAL HIGH (ref 70–99)
Glucose-Capillary: 102 mg/dL — ABNORMAL HIGH (ref 70–99)
Glucose-Capillary: 102 mg/dL — ABNORMAL HIGH (ref 70–99)
Glucose-Capillary: 114 mg/dL — ABNORMAL HIGH (ref 70–99)
Glucose-Capillary: 117 mg/dL — ABNORMAL HIGH (ref 70–99)
Glucose-Capillary: 85 mg/dL (ref 70–99)
Glucose-Capillary: 90 mg/dL (ref 70–99)

## 2020-07-14 LAB — LIPID PANEL
Cholesterol: 99 mg/dL (ref 0–200)
HDL: 31 mg/dL — ABNORMAL LOW (ref >40)
LDL Cholesterol: 32 mg/dL (ref 0–99)
Total CHOL/HDL Ratio: 3.2 RATIO
Triglycerides: 178 mg/dL — ABNORMAL HIGH (ref <150)
VLDL: 36 mg/dL (ref 0–40)

## 2020-07-14 LAB — ECHOCARDIOGRAM COMPLETE
Area-P 1/2: 3.21 cm2
S' Lateral: 2.8 cm
Weight: 2793.67 oz

## 2020-07-14 LAB — MAGNESIUM: Magnesium: 2.2 mg/dL (ref 1.7–2.4)

## 2020-07-14 LAB — HEPATITIS B SURFACE ANTIGEN: Hepatitis B Surface Ag: NONREACTIVE

## 2020-07-14 MED ORDER — HEPARIN SODIUM (PORCINE) 1000 UNIT/ML IJ SOLN
INTRAMUSCULAR | Status: AC
  Administered 2020-07-14: 10:00:00 3800 [IU]
  Filled 2020-07-14: qty 4

## 2020-07-14 MED ORDER — CHLORHEXIDINE GLUCONATE 0.12 % MT SOLN
15.0000 mL | Freq: Two times a day (BID) | OROMUCOSAL | Status: DC
  Administered 2020-07-14 – 2020-07-30 (×31): 15 mL via OROMUCOSAL
  Filled 2020-07-14 (×29): qty 15

## 2020-07-14 MED ORDER — FENTANYL CITRATE (PF) 100 MCG/2ML IJ SOLN
12.5000 ug | INTRAMUSCULAR | Status: DC | PRN
  Administered 2020-07-14 (×2): 50 ug via INTRAVENOUS
  Filled 2020-07-14 (×2): qty 2

## 2020-07-14 MED ORDER — ORAL CARE MOUTH RINSE
15.0000 mL | Freq: Two times a day (BID) | OROMUCOSAL | Status: DC
  Administered 2020-07-14 – 2020-07-30 (×31): 15 mL via OROMUCOSAL

## 2020-07-14 MED ORDER — FENTANYL CITRATE (PF) 100 MCG/2ML IJ SOLN
50.0000 ug | INTRAMUSCULAR | Status: DC | PRN
  Administered 2020-07-14: 50 ug via INTRAVENOUS
  Filled 2020-07-14 (×2): qty 2

## 2020-07-14 MED ORDER — METOPROLOL TARTRATE 5 MG/5ML IV SOLN
2.5000 mg | Freq: Four times a day (QID) | INTRAVENOUS | Status: DC | PRN
  Administered 2020-07-15 (×2): 2.5 mg via INTRAVENOUS
  Filled 2020-07-14 (×2): qty 5

## 2020-07-14 MED ORDER — ASPIRIN EC 81 MG PO TBEC
81.0000 mg | DELAYED_RELEASE_TABLET | Freq: Every day | ORAL | Status: DC
  Administered 2020-07-14 – 2020-07-26 (×13): 81 mg via ORAL
  Filled 2020-07-14 (×13): qty 1

## 2020-07-14 MED ORDER — PANTOPRAZOLE SODIUM 40 MG PO TBEC
40.0000 mg | DELAYED_RELEASE_TABLET | Freq: Two times a day (BID) | ORAL | Status: DC
  Administered 2020-07-14 – 2020-07-30 (×33): 40 mg via ORAL
  Filled 2020-07-14 (×33): qty 1

## 2020-07-14 NOTE — Progress Notes (Signed)
Echocardiogram 2D Echocardiogram has been performed.  [name] 07/14/2020, 12:03 PM

## 2020-07-14 NOTE — Progress Notes (Signed)
Daily Rounding Note  07/14/2020, 11:17 AM  LOS: 2 days   SUBJECTIVE:   Chief complaint:    CGE stools  BM yesterday of unclear,, consistency but not reported at shift change or melenic.   Remains on isolation precautions.  COVID-19.  Hemodynamically stable, respiratory status stable. No nausea or vomiting.  Started on clear liquid diet at 10 AM this morning. Oxygen sats 91 to 94% on 6 L nasal cannula oxygen.  OBJECTIVE:         Vital signs in last 24 hours:    Temp:  [97.5 F (36.4 C)-98.3 F (36.8 C)] 98.2 F (36.8 C) (12/20 1044) Pulse Rate:  [66-80] 76 (12/20 1040) Resp:  [13-24] 20 (12/20 1040) BP: (88-160)/(57-121) 140/72 (12/20 1040) SpO2:  [85 %-94 %] 92 % (12/20 1040) Weight:  [79.2 kg-80.6 kg] 79.2 kg (12/20 1040) Last BM Date: 07/13/20 Filed Weights   07/14/20 0200 07/14/20 0715 07/14/20 1040  Weight: 80.6 kg 79.2 kg 79.2 kg   General: Not reexamined.  Alert, looks chronically ill when seen through the glass door.  No obvious respiratory distress.   Intake/Output from previous day: 12/19 0701 - 12/20 0700 In: 628.3 [I.V.:628.3] Out: 550 [Urine:450]  Intake/Output this shift: Total I/O In: 30 [I.V.:30] Out: 1750 [Urine:50; Other:1700]  Lab Results: Recent Labs    07/12/20 0947 07/12/20 0954 07/13/20 0412 07/14/20 0232  WBC 5.4  --  5.3 7.9  HGB 10.8* 10.5* 11.4* 12.2  HCT 35.4* 31.0* 34.8* 38.4  PLT 123*  --  92* 112*   BMET Recent Labs    07/12/20 2308 07/13/20 0412 07/14/20 0232  NA 139 137 141  K 3.9 3.8 4.3  CL 105 105 105  CO2 19* 17* 16*  GLUCOSE 143* 155* 122*  BUN 81* 84* 90*  CREATININE 5.59* 5.55* 6.73*  CALCIUM 7.7* 7.6* 8.0*   LFT Recent Labs    07/12/20 0947 07/14/20 0232  PROT 6.1*  --   ALBUMIN 2.7* 2.4*  AST 51*  --   ALT 25  --   ALKPHOS 51  --   BILITOT 0.6  --    PT/INR Recent Labs    07/12/20 0947  LABPROT 13.5  INR 1.1   Hepatitis  Panel Recent Labs    07/14/20 0745  HEPBSAG NON REACTIVE    Studies/Results: CT HEAD WO CONTRAST  Result Date: 07/13/2020 CLINICAL DATA:  Follow-up examination of basal ganglia hemorrhage. EXAM: CT HEAD WITHOUT CONTRAST TECHNIQUE: Contiguous axial images were obtained from the base of the skull through the vertex without intravenous contrast. COMPARISON:  Prior CT from 07/12/2020 FINDINGS: Brain: Previously identified intraparenchymal hemorrhage at the posterior right lentiform nucleus again seen. Bleed has mildly dispersed and is less dense in appearance as compared to previous exam, measuring overall similar in size at 10 x 7 x 10 mm, previously 10 x 5 x 10 mm when measured in a similar fashion. No significant surrounding edema or regional mass effect. No other acute or interval intracranial hemorrhage. No other acute large vessel territory infarct. No mass lesion, mass effect, or midline shift. No hydrocephalus or extra-axial fluid collection. Atrophy with chronic microvascular ischemic disease again noted. Vascular: No hyperdense vessel. Calcified atherosclerosis at the skull base. Skull: Scalp soft tissues and calvarium within normal limits. Sinuses/Orbits: Globes and orbital soft tissues demonstrate no acute finding. Mild mucoperiosteal thickening noted throughout the visualized paranasal sinuses. Mastoid air cells remain clear. Other: None. IMPRESSION: 1. Normal  expected interval evolution of acute intraparenchymal hemorrhage positioned at the posterior right lentiform nucleus, overall similar in size but with slightly decreased density as compared to previous. No significant surrounding edema or regional mass effect. 2. No other new acute intracranial abnormality. 3. Atrophy with chronic microvascular ischemic disease. Electronically Signed   By: Jeannine Boga M.D.   On: 07/13/2020 18:45   MR ANGIO HEAD WO CONTRAST  Result Date: 07/13/2020 CLINICAL DATA:  Follow-up examination for  acute stroke. EXAM: MRI HEAD WITHOUT CONTRAST MRA HEAD WITHOUT CONTRAST TECHNIQUE: Multiplanar, multiecho pulse sequences of the brain and surrounding structures were obtained without intravenous contrast. Angiographic images of the head were obtained using MRA technique without contrast. COMPARISON:  Prior head CT from 07/12/2020 as well as previous brain MRI from 08/17/2016. FINDINGS: MRI HEAD FINDINGS Brain: Diffuse prominence of the CSF containing spaces compatible with generalized age-related cerebral atrophy. Patchy and confluent T2/FLAIR hyperintensity within the periventricular and deep white matter both cerebral hemispheres most likely related chronic microvascular ischemic disease. Patchy involvement of the pons. Overall, these changes are mildly progressed as compared to 2018. Few small remote lacunar type infarcts noted about the basal ganglia, most notable which present at the right caudothalamic groove. Previously identified hemorrhage centered at the posterior right lentiform nucleus/right internal capsule again seen, stable in size measuring up to approximately 1 cm in greatest dimension. This is somewhat difficult to visualized by MR, and is most notable on T1 weighted sequence (series 16, image 26). No significant surrounding edema or regional mass effect. No other acute intracranial hemorrhage. Few scattered chronic micro hemorrhages noted at the right cerebellum and pons, likely small vessel/hypertensive in nature. Single punctate 4 mm focus of diffusion abnormality involving the subcortical white matter of the right parietal corona radiata noted, likely a small acute to subacute small vessel type ischemic infarct (series 5, image 74). No associated hemorrhage or mass effect. No other diffusion abnormality to suggest acute or subacute ischemia. Gray-white matter differentiation otherwise maintained. No mass lesion, mass effect, or midline shift. No hydrocephalus or extra-axial fluid collection.  Pituitary gland suprasellar region normal. Midline structures intact. 1.3 cm ovoid well-circumscribed lesion positioned at the left petrous apex demonstrating heterogeneous T2/FLAIR signal intensity with hyperintense T1 signal intensity noted (series 16, image 14), nonspecific, but could reflect a small cholesterol granuloma versus asymmetric fatty marrow. Finding is relatively stable from previous MRI from 2018, and of doubtful significance. Vascular: Major intracranial vascular flow voids are maintained. Skull and upper cervical spine: Craniocervical junction within normal limits. Multilevel degenerative spondylosis noted within the visualized upper cervical spine with resultant mild-to-moderate spinal stenosis. Bone marrow signal intensity within normal limits. No scalp soft tissue abnormality. Sinuses/Orbits: Patient status post bilateral ocular lens replacement. Globes and orbital soft tissues demonstrate no acute finding. Mild scattered mucosal thickening noted throughout the paranasal sinuses. Trace bilateral mastoid effusions, of doubtful significance. Visualized nasopharynx within normal limits. Other: None. MRA HEAD FINDINGS ANTERIOR CIRCULATION: Examination degraded by motion artifact. Visualized distal cervical segments of the internal carotid arteries are patent with symmetric antegrade flow. Petrous segments widely patent. Atheromatous irregularity throughout the carotid siphons with associated mild multifocal narrowing. A1 segments patent bilaterally. Normal anterior communicating artery complex. Anterior cerebral arteries patent to their distal aspects without high-grade stenosis. Short-segment severe stenosis at the distal left M1 segment/left MCA bifurcation (series 1036, image 12). Left MCA branches are diffusely irregular but perfused distally. Right M1 segment patent proximally. Focal signal cutoff at the mid-distal right M1 segment into  the bifurcation (series 1036, image 11). This likely  reflects a severe high-grade stenosis, as the right MCA branches are perfused distally. Diffuse atheromatous irregularity seen throughout the right MCA branches as well. Proximally. POSTERIOR CIRCULATION: Vertebral arteries largely code dominant and patent to the vertebrobasilar junction. Both PICA origins patent and normal. Basilar patent to its distal aspect without stenosis. Superior cerebral arteries patent bilaterally. Both PCAs primarily supplied via the basilar. Severe long segment stenosis involving the mid-distal right P2 segment (series 1046, image 15). Severe multifocal distal left P2 and P3 stenoses present as well (series 1046, image 14). PCAs remain perfused to their distal aspects. No intracranial aneurysm or other vascular abnormality. IMPRESSION: MRI HEAD IMPRESSION: 1. Unchanged 1 cm hemorrhage centered at the posterior right lentiform nucleus/right internal capsule. No significant surrounding edema or regional mass effect. 2. 4 mm focus of diffusion abnormality involving the subcortical white matter of the right parietal corona radiata, consistent with a small acute to subacute small vessel type ischemic infarct. No associated hemorrhage. 3. Underlying age-related cerebral atrophy with moderate chronic microvascular ischemic disease, mildly progressed as compared to 2018. MRA HEAD IMPRESSION: 1. Technically limited exam due to motion artifact. 2. Negative intracranial MRA for large vessel occlusion. 3. Focal severe high-grade stenoses involving the mid-distal right M1 segment and distal left M1 segment/left MCA bifurcation. Left MCA branches remain perfused distally. 4. Severe multifocal bilateral P2 and distal left P3 stenoses as above. Electronically Signed   By: Jeannine Boga M.D.   On: 07/13/2020 19:16   MR BRAIN WO CONTRAST  Result Date: 07/13/2020 CLINICAL DATA:  Follow-up examination for acute stroke. EXAM: MRI HEAD WITHOUT CONTRAST MRA HEAD WITHOUT CONTRAST TECHNIQUE:  Multiplanar, multiecho pulse sequences of the brain and surrounding structures were obtained without intravenous contrast. Angiographic images of the head were obtained using MRA technique without contrast. COMPARISON:  Prior head CT from 07/12/2020 as well as previous brain MRI from 08/17/2016. FINDINGS: MRI HEAD FINDINGS Brain: Diffuse prominence of the CSF containing spaces compatible with generalized age-related cerebral atrophy. Patchy and confluent T2/FLAIR hyperintensity within the periventricular and deep white matter both cerebral hemispheres most likely related chronic microvascular ischemic disease. Patchy involvement of the pons. Overall, these changes are mildly progressed as compared to 2018. Few small remote lacunar type infarcts noted about the basal ganglia, most notable which present at the right caudothalamic groove. Previously identified hemorrhage centered at the posterior right lentiform nucleus/right internal capsule again seen, stable in size measuring up to approximately 1 cm in greatest dimension. This is somewhat difficult to visualized by MR, and is most notable on T1 weighted sequence (series 16, image 26). No significant surrounding edema or regional mass effect. No other acute intracranial hemorrhage. Few scattered chronic micro hemorrhages noted at the right cerebellum and pons, likely small vessel/hypertensive in nature. Single punctate 4 mm focus of diffusion abnormality involving the subcortical white matter of the right parietal corona radiata noted, likely a small acute to subacute small vessel type ischemic infarct (series 5, image 74). No associated hemorrhage or mass effect. No other diffusion abnormality to suggest acute or subacute ischemia. Gray-white matter differentiation otherwise maintained. No mass lesion, mass effect, or midline shift. No hydrocephalus or extra-axial fluid collection. Pituitary gland suprasellar region normal. Midline structures intact. 1.3 cm ovoid  well-circumscribed lesion positioned at the left petrous apex demonstrating heterogeneous T2/FLAIR signal intensity with hyperintense T1 signal intensity noted (series 16, image 14), nonspecific, but could reflect a small cholesterol granuloma versus asymmetric fatty marrow.  Finding is relatively stable from previous MRI from 2018, and of doubtful significance. Vascular: Major intracranial vascular flow voids are maintained. Skull and upper cervical spine: Craniocervical junction within normal limits. Multilevel degenerative spondylosis noted within the visualized upper cervical spine with resultant mild-to-moderate spinal stenosis. Bone marrow signal intensity within normal limits. No scalp soft tissue abnormality. Sinuses/Orbits: Patient status post bilateral ocular lens replacement. Globes and orbital soft tissues demonstrate no acute finding. Mild scattered mucosal thickening noted throughout the paranasal sinuses. Trace bilateral mastoid effusions, of doubtful significance. Visualized nasopharynx within normal limits. Other: None. MRA HEAD FINDINGS ANTERIOR CIRCULATION: Examination degraded by motion artifact. Visualized distal cervical segments of the internal carotid arteries are patent with symmetric antegrade flow. Petrous segments widely patent. Atheromatous irregularity throughout the carotid siphons with associated mild multifocal narrowing. A1 segments patent bilaterally. Normal anterior communicating artery complex. Anterior cerebral arteries patent to their distal aspects without high-grade stenosis. Short-segment severe stenosis at the distal left M1 segment/left MCA bifurcation (series 1036, image 12). Left MCA branches are diffusely irregular but perfused distally. Right M1 segment patent proximally. Focal signal cutoff at the mid-distal right M1 segment into the bifurcation (series 1036, image 11). This likely reflects a severe high-grade stenosis, as the right MCA branches are perfused distally.  Diffuse atheromatous irregularity seen throughout the right MCA branches as well. Proximally. POSTERIOR CIRCULATION: Vertebral arteries largely code dominant and patent to the vertebrobasilar junction. Both PICA origins patent and normal. Basilar patent to its distal aspect without stenosis. Superior cerebral arteries patent bilaterally. Both PCAs primarily supplied via the basilar. Severe long segment stenosis involving the mid-distal right P2 segment (series 1046, image 15). Severe multifocal distal left P2 and P3 stenoses present as well (series 1046, image 14). PCAs remain perfused to their distal aspects. No intracranial aneurysm or other vascular abnormality. IMPRESSION: MRI HEAD IMPRESSION: 1. Unchanged 1 cm hemorrhage centered at the posterior right lentiform nucleus/right internal capsule. No significant surrounding edema or regional mass effect. 2. 4 mm focus of diffusion abnormality involving the subcortical white matter of the right parietal corona radiata, consistent with a small acute to subacute small vessel type ischemic infarct. No associated hemorrhage. 3. Underlying age-related cerebral atrophy with moderate chronic microvascular ischemic disease, mildly progressed as compared to 2018. MRA HEAD IMPRESSION: 1. Technically limited exam due to motion artifact. 2. Negative intracranial MRA for large vessel occlusion. 3. Focal severe high-grade stenoses involving the mid-distal right M1 segment and distal left M1 segment/left MCA bifurcation. Left MCA branches remain perfused distally. 4. Severe multifocal bilateral P2 and distal left P3 stenoses as above. Electronically Signed   By: Jeannine Boga M.D.   On: 07/13/2020 19:16   DG Pelvis Portable  Result Date: 07/12/2020 CLINICAL DATA:  Right hip pain.  No reported injury. EXAM: PORTABLE PELVIS 1-2 VIEWS COMPARISON:  07/21/2018 outside pelvic radiographs FINDINGS: Partially visualized left hip hemiarthroplasty and partially visualized  fixation hardware in the proximal right femur with no evidence of hardware fracture or loosening. No evidence of hip dislocation on this single frontal view. No osseous fracture. No focal osseous lesions. Moderate right hip osteoarthritis. No suspicious focal osseous lesions. No pelvic diastasis. IMPRESSION: No acute osseous abnormality. No evidence of hip dislocation on this single frontal view. Moderate right hip osteoarthritis. Partially visualized left hip hemiarthroplasty and proximal right femur fixation hardware, with no evidence of hardware complication. Electronically Signed   By: Ilona Sorrel M.D.   On: 07/12/2020 12:33   DG Abd Portable 1V  Result  Date: 07/13/2020 CLINICAL DATA:  Evaluate orogastric tube EXAM: PORTABLE ABDOMEN - 1 VIEW COMPARISON:  July 13, 2020 FINDINGS: The OG tube side port and distal tip are in the region the stomach. IMPRESSION: The OG tube is in good position. Electronically Signed   By: Dorise Bullion III M.D   On: 07/13/2020 11:04   DG Abd Portable 1V  Result Date: 07/13/2020 CLINICAL DATA:  Vomiting. EXAM: PORTABLE ABDOMEN - 1 VIEW COMPARISON:  None. FINDINGS: No evidence of dilated bowel loops. Aortic atherosclerotic calcification noted. Compression screw noted in the right hip. Left hip prosthesis is also seen. IMPRESSION: Unremarkable bowel gas pattern.  No acute findings. Electronically Signed   By: Marlaine Hind M.D.   On: 07/13/2020 05:08    ASSESMENT:   *    CGE, black stools.   Day 3 IV PPI.    *   Normocytic anemia.  Hgb improved 10.5 >> 12.2 Previous but not recent ESA and iron.    *   Hemorrhagic CVA.    *   Afib.  No AC.    *   Treated with monoclonal antibody infusion for positive Covid 07/11/2020.  Never vaccinated.    *    PCP is Dr. Bertram Millard in [place].    *     ESRD on HD MWF.    *    IDDM  PLAN   *   Switch to Protonix 40 mg po daily after current bag of IVF finishes.     *    EGD?, Dr Havery Moros will decide.    *    Advance diet to renal restriction diet as tolerated    Azucena Freed  07/14/2020, 11:17 AM Phone [phone]

## 2020-07-14 NOTE — Progress Notes (Signed)
Carotid duplex has been completed.   Preliminary results in CV Proc.   [name] 07/14/2020 12:57 PM

## 2020-07-14 NOTE — Progress Notes (Signed)
  IMPRESSION: 1. Severe degenerative right hip arthropathy. 2. Right hip screw with IM nail. Right intertrochanteric deformity from old fracture. No acute fracture is identified. 3. Left hip hemiarthroplasty without discrete complicating feature identified. 4. Lower lumbar spondylosis and degenerative disc disease leading to moderate left foraminal stenosis at L5-S1. 5. Aortic atherosclerosis.  Aortic Atherosclerosis (ICD10-I70.0).    Hip pain is due to DJD  She was incorrect in saying no hip issues in past  ppan  - for now fent prn  - might need scheduled tylenol etc.,  - [facility] to address ni AM of 07/15/20

## 2020-07-14 NOTE — Progress Notes (Signed)
NAME:  [name], MRN:  [id number], DOB:  14-Jan-1941, LOS: 2 ADMISSION DATE:  07/12/2020, CONSULTATION DATE:  07/12/2020 REFERRING MD:  Dr Gilford Raid, CHIEF COMPLAINT:  New stroke, GI Bleed, Recent Covid   Brief History:  Patient brought into the hospital for altered mentation Facial droop-last known normal at 0400 hrs. today Recent diagnosis of Covid 12/13, received monoclonal antibodies on 07/11/2020, not vaccinated New onset atrial fibrillation End-stage renal disease on dialysis-last dialysis 07/11/2020 Melanotic stool and coffee ground emesis-GI bleed  ast known well at 0400 hrs.-did not have any focalities at that time according to documentation Activated EMS for melanotic stool, GI bleed Has had melanotic stools in the ED and small amount of coffee-ground emesis  Past Medical History:   has a past medical history of Cancer ([place]), Diabetes mellitus, and Hyperlipidemia.  has a past surgical history that includes Breast lumpectomy (03/26/2011). Not vaccinated COVID  ESRD - MWF  Significant Hospital Events:   12/13  - covid  12/18 admitted  - noted facial droop with extremity weakness CT showed a basal ganglia hemorrhagic stroke. Also melena and new onset  A Fib RVR at admit. S/p 2 u prbc   12/19 - Afebrile/ normal WBC afib with RVR overnight placed on amiodarone-> since converted to NSR Refractory nausea and bilious vomiting overnight despite zofran, NGT placed ~23ml output, KUB neg No further melena  Hgb 10.5-> 11.4, plts 123-> 92 Received multiple doses of fentanyl overnight for right hip  Consults:  Neurology Gastroenterology Nephrology   Procedures:  12/19 NGT >>  Significant Diagnostic Tests:  [facility] 12/18 >> 1. Acute 7 mm hemorrhage within the right basal ganglia. No substantial mass effect. 2. Chronic microvascular ischemic disease and remote right cerebellar lacunar infarct.  12/18 pelvis XR >> No acute osseous abnormality. No evidence of hip  dislocation on this single frontal view. Moderate right hip osteoarthritis. Partially visualized left hip hemiarthroplasty and proximal right femur fixation hardware, with no evidence of hardware complication.  12/19 KUB >> neg  Micro Data:  12/14-positive for Covid  Antimicrobials:  none  Interim History / Subjective:    12/20 -not on the ventilator.  Not on pressors.  Undergoing dialysis.  There is new onset right hip pain that is severe.  Had gotten fentanyl 50 mcg as needed but then gets drowsy [dialysis patient].  Overall oriented.  Left side slightly weak.  No active bleeding.  Protonix infusion ongoing.  Objective   Blood pressure 109/64, pulse 69, temperature 98.2 F (36.8 C), temperature source Oral, resp. rate 17, weight 79.2 kg, SpO2 93 %.        Intake/Output Summary (Last 24 hours) at 07/14/2020 0949 Last data filed at 07/14/2020 0900 Gross per 24 hour  Intake 648.24 ml  Output 600 ml  Net 48.24 ml   Filed Weights   07/13/20 0427 07/14/20 0200 07/14/20 0715  Weight: 77 kg 80.6 kg 79.2 kg   Examination: Elderly frail female.  Not in overt distress.  Resting comfortably on nasal cannula oxygen.  Dialysis ongoing.  She is alert and oriented x3.  She complains of pain in the right hip.  She moans because of this not able to give a good history.  Left side slightly weak.  Abdomen soft no overt rash.  No obvious bleeding.  Resolved Hospital Problem list     Assessment & Plan:  Patient who was last known to be well at about 4 AM 12/18 who activated EMS for what was felt to  be a GI bleed, noted facial droop with extremity weakness Evaluated in the ED, CT showed a basal ganglia hemorrhagic stroke New onset atrial fibrillation, borderline blood pressure, active GI bleed, recent Covid infection dx 12/13.  With significant comorbidities and borderline blood pressure, patient will be admitted to the intensive care unit for close monitoring  GI bleed   07/14/2020 - no  active bleeing. [facility] to see patient today. On ppi infuison. S.p PRBC 12/18 Plan - per GI following - suspect EGD during hospitalization v when stabklzies - PPI gtt per GI - transfuse for Hgb < 7 - remains hemodynamic stable/ slightly hypertensive - stop IVF   Acute right basal ganglia hemorrhagic stroke  07/14/2020:: Mild left-sided weakness present  Plan - per Neuro - SBP goal <140, may liberate to goal < 160 - remaining stroke workup per neuro - ongoing neuro exams  - PT/ OT/ SLP - no current antiplatelets/anticoagulants for now with GIB  Covid infection 07/08/2019: - - Post monoclonal antibody. - CXR 12/18 patchy left infiltrate vs atelectasis   Plan - continue supplemental O2 as need for sat goal > 92%  - aggressive pulmonary hygiene -Recheck chest x-ray 07/15/2020   New onset atrial fibrillation - converted back to NSR as of 07/13/20. Off amio gtt 12/19  Plan - continue tele monitoring  - goal K > 4; Mag > 2 - No anticoagulation with GIB/ ICH - dc scheduled low dose lopressor - > change to PRN  End-stage renal disease - On HD MWF, last dialysis 12/17  Plan  - iHD scheduled for 12/20 and ongong - stop IVF - per renal   History of carcinoma of the right breast - f/u outpt  Type 2 diabetes -SSI, currently within goal   Hip pain, right  - new this admit:  12.19  pelvis XR for moderate right hip osteoarthritis/ no acute abnormality; previous left hip hemiarthroplasty and proximal right femur fixation hardware, with no evidence of hardware complication   32/99/2426 - persistss, severe, dominant symptm. Despite fent prn  plan -CT pelvis/hip - do fent prn but lower dose      Best practice (evaluated daily)  Diet: NPO -> start clear lqiuids Pain/Anxiety/Delirium protocol (if indicated): n/a VAP protocol (if indicated): n/a DVT prophylaxis: SCD only  GI prophylaxis: Protonix gtt Glucose control: We will monitor Mobility:  Bedrest Disposition: ICU -> 5w  Goals of Care:  Code Status: Full code.  12/19, patient states she wants everything done for her.  Her husband is currently hospitalized [facility] with [place].  Ask we call and update her granddaughter, [name] [phone].   Called grandautehr 12/20 - went to VM     ATTESTATION & SIGNATURE     Dr. Brand Males, M.D., [facility].C.P Pulmonary and Critical Care Medicine Staff Physician [facility] Pager: [phone], If no answer or between  15:00h - 7:00h: call [phone]  07/14/2020 9:49 AM     LABS    PULMONARY Recent Labs  Lab 07/12/20 0954  TCO2 21*    CBC Recent Labs  Lab 07/12/20 0947 07/12/20 0954 07/13/20 0412 07/14/20 0232  HGB 10.8* 10.5* 11.4* 12.2  HCT 35.4* 31.0* 34.8* 38.4  WBC 5.4  --  5.3 7.9  PLT 123*  --  92* 112*    COAGULATION Recent Labs  Lab 07/12/20 0947  INR 1.1    CARDIAC  No results for input(s): TROPONINI in the last 168  hours. No results for input(s): PROBNP in the last 168 hours.   CHEMISTRY Recent Labs  Lab 07/12/20 0947 07/12/20 0954 07/12/20 2308 07/13/20 0412 07/14/20 0232  NA 137 136 139 137 141  K 3.9 3.9 3.9 3.8 4.3  CL 99 103 105 105 105  CO2 20*  --  19* 17* 16*  GLUCOSE 154* 152* 143* 155* 122*  BUN 72* 76* 81* 84* 90*  CREATININE 5.34* 5.30* 5.59* 5.55* 6.73*  CALCIUM 8.1*  --  7.7* 7.6* 8.0*  MG  --   --  1.6*  --  2.2  PHOS  --   --   --   --  8.6*   Estimated Creatinine Clearance: 7 mL/min (A) (by C-G formula based on SCr of 6.73 mg/dL (H)).   LIVER Recent Labs  Lab 07/12/20 0947 07/14/20 0232  AST 51*  --   ALT 25  --   ALKPHOS 51  --   BILITOT 0.6  --   PROT 6.1*  --   ALBUMIN 2.7* 2.4*  INR 1.1  --      INFECTIOUS No results for input(s): LATICACIDVEN, PROCALCITON in the last 168 hours.   ENDOCRINE CBG (last 3)  Recent Labs    07/13/20 2351 07/14/20 0338 07/14/20 0740  GLUCAP 117*  114* 100*         IMAGING x48h  - image(s) personally visualized  -   highlighted in bold DG Chest 1 View  Result Date: 07/12/2020 CLINICAL DATA:  Patient with history of COVID-19. History of GI bleed. EXAM: CHEST  1 VIEW COMPARISON:  Chest radiograph 07/10/2020. FINDINGS: Stable right-sided dialysis catheter. Monitoring leads overlie the patient. Stable cardiomegaly. Aortic atherosclerosis. Site oval increased patchy opacities left mid lower lung. No definite pleural effusion or pneumothorax. Thoracic spine degenerative changes. Surgical clips right axilla. IMPRESSION: Increasing patchy opacities left mid and lower lung may represent infection in the appropriate clinical setting or potentially atelectasis. Electronically Signed   By: Lovey Newcomer M.D.   On: 07/12/2020 11:24   CT HEAD WO CONTRAST  Result Date: 07/13/2020 CLINICAL DATA:  Follow-up examination of basal ganglia hemorrhage. EXAM: CT HEAD WITHOUT CONTRAST TECHNIQUE: Contiguous axial images were obtained from the base of the skull through the vertex without intravenous contrast. COMPARISON:  Prior CT from 07/12/2020 FINDINGS: Brain: Previously identified intraparenchymal hemorrhage at the posterior right lentiform nucleus again seen. Bleed has mildly dispersed and is less dense in appearance as compared to previous exam, measuring overall similar in size at 10 x 7 x 10 mm, previously 10 x 5 x 10 mm when measured in a similar fashion. No significant surrounding edema or regional mass effect. No other acute or interval intracranial hemorrhage. No other acute large vessel territory infarct. No mass lesion, mass effect, or midline shift. No hydrocephalus or extra-axial fluid collection. Atrophy with chronic microvascular ischemic disease again noted. Vascular: No hyperdense vessel. Calcified atherosclerosis at the skull base. Skull: Scalp soft tissues and calvarium within normal limits. Sinuses/Orbits: Globes and orbital soft tissues  demonstrate no acute finding. Mild mucoperiosteal thickening noted throughout the visualized paranasal sinuses. Mastoid air cells remain clear. Other: None. IMPRESSION: 1. Normal expected interval evolution of acute intraparenchymal hemorrhage positioned at the posterior right lentiform nucleus, overall similar in size but with slightly decreased density as compared to previous. No significant surrounding edema or regional mass effect. 2. No other new acute intracranial abnormality. 3. Atrophy with chronic microvascular ischemic disease. Electronically Signed   By: Jeannine Boga  M.D.   On: 07/13/2020 18:45   MR ANGIO HEAD WO CONTRAST  Result Date: 07/13/2020 CLINICAL DATA:  Follow-up examination for acute stroke. EXAM: MRI HEAD WITHOUT CONTRAST MRA HEAD WITHOUT CONTRAST TECHNIQUE: Multiplanar, multiecho pulse sequences of the brain and surrounding structures were obtained without intravenous contrast. Angiographic images of the head were obtained using MRA technique without contrast. COMPARISON:  Prior head CT from 07/12/2020 as well as previous brain MRI from 08/17/2016. FINDINGS: MRI HEAD FINDINGS Brain: Diffuse prominence of the CSF containing spaces compatible with generalized age-related cerebral atrophy. Patchy and confluent T2/FLAIR hyperintensity within the periventricular and deep white matter both cerebral hemispheres most likely related chronic microvascular ischemic disease. Patchy involvement of the pons. Overall, these changes are mildly progressed as compared to 2018. Few small remote lacunar type infarcts noted about the basal ganglia, most notable which present at the right caudothalamic groove. Previously identified hemorrhage centered at the posterior right lentiform nucleus/right internal capsule again seen, stable in size measuring up to approximately 1 cm in greatest dimension. This is somewhat difficult to visualized by MR, and is most notable on T1 weighted sequence (series 16,  image 26). No significant surrounding edema or regional mass effect. No other acute intracranial hemorrhage. Few scattered chronic micro hemorrhages noted at the right cerebellum and pons, likely small vessel/hypertensive in nature. Single punctate 4 mm focus of diffusion abnormality involving the subcortical white matter of the right parietal corona radiata noted, likely a small acute to subacute small vessel type ischemic infarct (series 5, image 74). No associated hemorrhage or mass effect. No other diffusion abnormality to suggest acute or subacute ischemia. Gray-white matter differentiation otherwise maintained. No mass lesion, mass effect, or midline shift. No hydrocephalus or extra-axial fluid collection. Pituitary gland suprasellar region normal. Midline structures intact. 1.3 cm ovoid well-circumscribed lesion positioned at the left petrous apex demonstrating heterogeneous T2/FLAIR signal intensity with hyperintense T1 signal intensity noted (series 16, image 14), nonspecific, but could reflect a small cholesterol granuloma versus asymmetric fatty marrow. Finding is relatively stable from previous MRI from 2018, and of doubtful significance. Vascular: Major intracranial vascular flow voids are maintained. Skull and upper cervical spine: Craniocervical junction within normal limits. Multilevel degenerative spondylosis noted within the visualized upper cervical spine with resultant mild-to-moderate spinal stenosis. Bone marrow signal intensity within normal limits. No scalp soft tissue abnormality. Sinuses/Orbits: Patient status post bilateral ocular lens replacement. Globes and orbital soft tissues demonstrate no acute finding. Mild scattered mucosal thickening noted throughout the paranasal sinuses. Trace bilateral mastoid effusions, of doubtful significance. Visualized nasopharynx within normal limits. Other: None. MRA HEAD FINDINGS ANTERIOR CIRCULATION: Examination degraded by motion artifact. Visualized  distal cervical segments of the internal carotid arteries are patent with symmetric antegrade flow. Petrous segments widely patent. Atheromatous irregularity throughout the carotid siphons with associated mild multifocal narrowing. A1 segments patent bilaterally. Normal anterior communicating artery complex. Anterior cerebral arteries patent to their distal aspects without high-grade stenosis. Short-segment severe stenosis at the distal left M1 segment/left MCA bifurcation (series 1036, image 12). Left MCA branches are diffusely irregular but perfused distally. Right M1 segment patent proximally. Focal signal cutoff at the mid-distal right M1 segment into the bifurcation (series 1036, image 11). This likely reflects a severe high-grade stenosis, as the right MCA branches are perfused distally. Diffuse atheromatous irregularity seen throughout the right MCA branches as well. Proximally. POSTERIOR CIRCULATION: Vertebral arteries largely code dominant and patent to the vertebrobasilar junction. Both PICA origins patent and normal. Basilar patent to its distal  aspect without stenosis. Superior cerebral arteries patent bilaterally. Both PCAs primarily supplied via the basilar. Severe long segment stenosis involving the mid-distal right P2 segment (series 1046, image 15). Severe multifocal distal left P2 and P3 stenoses present as well (series 1046, image 14). PCAs remain perfused to their distal aspects. No intracranial aneurysm or other vascular abnormality. IMPRESSION: MRI HEAD IMPRESSION: 1. Unchanged 1 cm hemorrhage centered at the posterior right lentiform nucleus/right internal capsule. No significant surrounding edema or regional mass effect. 2. 4 mm focus of diffusion abnormality involving the subcortical white matter of the right parietal corona radiata, consistent with a small acute to subacute small vessel type ischemic infarct. No associated hemorrhage. 3. Underlying age-related cerebral atrophy with moderate  chronic microvascular ischemic disease, mildly progressed as compared to 2018. MRA HEAD IMPRESSION: 1. Technically limited exam due to motion artifact. 2. Negative intracranial MRA for large vessel occlusion. 3. Focal severe high-grade stenoses involving the mid-distal right M1 segment and distal left M1 segment/left MCA bifurcation. Left MCA branches remain perfused distally. 4. Severe multifocal bilateral P2 and distal left P3 stenoses as above. Electronically Signed   By: Jeannine Boga M.D.   On: 07/13/2020 19:16   MR BRAIN WO CONTRAST  Result Date: 07/13/2020 CLINICAL DATA:  Follow-up examination for acute stroke. EXAM: MRI HEAD WITHOUT CONTRAST MRA HEAD WITHOUT CONTRAST TECHNIQUE: Multiplanar, multiecho pulse sequences of the brain and surrounding structures were obtained without intravenous contrast. Angiographic images of the head were obtained using MRA technique without contrast. COMPARISON:  Prior head CT from 07/12/2020 as well as previous brain MRI from 08/17/2016. FINDINGS: MRI HEAD FINDINGS Brain: Diffuse prominence of the CSF containing spaces compatible with generalized age-related cerebral atrophy. Patchy and confluent T2/FLAIR hyperintensity within the periventricular and deep white matter both cerebral hemispheres most likely related chronic microvascular ischemic disease. Patchy involvement of the pons. Overall, these changes are mildly progressed as compared to 2018. Few small remote lacunar type infarcts noted about the basal ganglia, most notable which present at the right caudothalamic groove. Previously identified hemorrhage centered at the posterior right lentiform nucleus/right internal capsule again seen, stable in size measuring up to approximately 1 cm in greatest dimension. This is somewhat difficult to visualized by MR, and is most notable on T1 weighted sequence (series 16, image 26). No significant surrounding edema or regional mass effect. No other acute intracranial  hemorrhage. Few scattered chronic micro hemorrhages noted at the right cerebellum and pons, likely small vessel/hypertensive in nature. Single punctate 4 mm focus of diffusion abnormality involving the subcortical white matter of the right parietal corona radiata noted, likely a small acute to subacute small vessel type ischemic infarct (series 5, image 74). No associated hemorrhage or mass effect. No other diffusion abnormality to suggest acute or subacute ischemia. Gray-white matter differentiation otherwise maintained. No mass lesion, mass effect, or midline shift. No hydrocephalus or extra-axial fluid collection. Pituitary gland suprasellar region normal. Midline structures intact. 1.3 cm ovoid well-circumscribed lesion positioned at the left petrous apex demonstrating heterogeneous T2/FLAIR signal intensity with hyperintense T1 signal intensity noted (series 16, image 14), nonspecific, but could reflect a small cholesterol granuloma versus asymmetric fatty marrow. Finding is relatively stable from previous MRI from 2018, and of doubtful significance. Vascular: Major intracranial vascular flow voids are maintained. Skull and upper cervical spine: Craniocervical junction within normal limits. Multilevel degenerative spondylosis noted within the visualized upper cervical spine with resultant mild-to-moderate spinal stenosis. Bone marrow signal intensity within normal limits. No scalp soft tissue abnormality.  Sinuses/Orbits: Patient status post bilateral ocular lens replacement. Globes and orbital soft tissues demonstrate no acute finding. Mild scattered mucosal thickening noted throughout the paranasal sinuses. Trace bilateral mastoid effusions, of doubtful significance. Visualized nasopharynx within normal limits. Other: None. MRA HEAD FINDINGS ANTERIOR CIRCULATION: Examination degraded by motion artifact. Visualized distal cervical segments of the internal carotid arteries are patent with symmetric antegrade  flow. Petrous segments widely patent. Atheromatous irregularity throughout the carotid siphons with associated mild multifocal narrowing. A1 segments patent bilaterally. Normal anterior communicating artery complex. Anterior cerebral arteries patent to their distal aspects without high-grade stenosis. Short-segment severe stenosis at the distal left M1 segment/left MCA bifurcation (series 1036, image 12). Left MCA branches are diffusely irregular but perfused distally. Right M1 segment patent proximally. Focal signal cutoff at the mid-distal right M1 segment into the bifurcation (series 1036, image 11). This likely reflects a severe high-grade stenosis, as the right MCA branches are perfused distally. Diffuse atheromatous irregularity seen throughout the right MCA branches as well. Proximally. POSTERIOR CIRCULATION: Vertebral arteries largely code dominant and patent to the vertebrobasilar junction. Both PICA origins patent and normal. Basilar patent to its distal aspect without stenosis. Superior cerebral arteries patent bilaterally. Both PCAs primarily supplied via the basilar. Severe long segment stenosis involving the mid-distal right P2 segment (series 1046, image 15). Severe multifocal distal left P2 and P3 stenoses present as well (series 1046, image 14). PCAs remain perfused to their distal aspects. No intracranial aneurysm or other vascular abnormality. IMPRESSION: MRI HEAD IMPRESSION: 1. Unchanged 1 cm hemorrhage centered at the posterior right lentiform nucleus/right internal capsule. No significant surrounding edema or regional mass effect. 2. 4 mm focus of diffusion abnormality involving the subcortical white matter of the right parietal corona radiata, consistent with a small acute to subacute small vessel type ischemic infarct. No associated hemorrhage. 3. Underlying age-related cerebral atrophy with moderate chronic microvascular ischemic disease, mildly progressed as compared to 2018. MRA HEAD  IMPRESSION: 1. Technically limited exam due to motion artifact. 2. Negative intracranial MRA for large vessel occlusion. 3. Focal severe high-grade stenoses involving the mid-distal right M1 segment and distal left M1 segment/left MCA bifurcation. Left MCA branches remain perfused distally. 4. Severe multifocal bilateral P2 and distal left P3 stenoses as above. Electronically Signed   By: Jeannine Boga M.D.   On: 07/13/2020 19:16   DG Pelvis Portable  Result Date: 07/12/2020 CLINICAL DATA:  Right hip pain.  No reported injury. EXAM: PORTABLE PELVIS 1-2 VIEWS COMPARISON:  07/21/2018 outside pelvic radiographs FINDINGS: Partially visualized left hip hemiarthroplasty and partially visualized fixation hardware in the proximal right femur with no evidence of hardware fracture or loosening. No evidence of hip dislocation on this single frontal view. No osseous fracture. No focal osseous lesions. Moderate right hip osteoarthritis. No suspicious focal osseous lesions. No pelvic diastasis. IMPRESSION: No acute osseous abnormality. No evidence of hip dislocation on this single frontal view. Moderate right hip osteoarthritis. Partially visualized left hip hemiarthroplasty and proximal right femur fixation hardware, with no evidence of hardware complication. Electronically Signed   By: Ilona Sorrel M.D.   On: 07/12/2020 12:33   DG Abd Portable 1V  Result Date: 07/13/2020 CLINICAL DATA:  Evaluate orogastric tube EXAM: PORTABLE ABDOMEN - 1 VIEW COMPARISON:  July 13, 2020 FINDINGS: The OG tube side port and distal tip are in the region the stomach. IMPRESSION: The OG tube is in good position. Electronically Signed   By: Dorise Bullion III M.D   On: 07/13/2020 11:04  DG Abd Portable 1V  Result Date: 07/13/2020 CLINICAL DATA:  Vomiting. EXAM: PORTABLE ABDOMEN - 1 VIEW COMPARISON:  None. FINDINGS: No evidence of dilated bowel loops. Aortic atherosclerotic calcification noted. Compression screw noted in the  right hip. Left hip prosthesis is also seen. IMPRESSION: Unremarkable bowel gas pattern.  No acute findings. Electronically Signed   By: Marlaine Hind M.D.   On: 07/13/2020 05:08   CT HEAD CODE STROKE WO CONTRAST  Addendum Date: 07/12/2020   ADDENDUM REPORT: 07/12/2020 10:16 ADDENDUM: Correction.  Findings were discussed with Dr. Theda Sers. Electronically Signed   By: Margaretha Sheffield MD   On: 07/12/2020 10:16   Result Date: 07/12/2020 CLINICAL DATA:  Code stroke.  Neuro deficit, acute stroke suspected. EXAM: CT HEAD WITHOUT CONTRAST TECHNIQUE: Contiguous axial images were obtained from the base of the skull through the vertex without intravenous contrast. COMPARISON:  MRI head August 17, 2016 FINDINGS: Brain: Acute hemorrhage in the right basal ganglia, centered in the region of the posterior putamen/posterior limb of the internal capsule. The hemorrhage measures approximately 7 x 4 by 6 mm. No evidence of acute large vascular territory infarct. Patchy white matter hypoattenuation, which is nonspecific but most likely related to chronic microvascular ischemic disease. Mild generalized atrophy. No hydrocephalus. No evidence of a mass lesion. No midline shift. Remote right cerebellar lacunar infarct. Vascular: Calcific atherosclerosis. Skull: No acute fracture. Sinuses/Orbits: Sinuses are clear.  Unremarkable orbits. Other: No mastoid effusion. IMPRESSION: 1. Acute 7 mm hemorrhage within the right basal ganglia. No substantial mass effect. 2. Chronic microvascular ischemic disease and remote right cerebellar lacunar infarct. Code stroke imaging results were communicated on 07/12/2020 at 10:00 am to provider Dr. Lorrin Goodell via telephone, who verbally acknowledged these results. Electronically Signed: By: Margaretha Sheffield MD On: 07/12/2020 10:05

## 2020-07-14 NOTE — Progress Notes (Signed)
  [facility] Progress Note   Assessment/ Plan:     OP HD: MWF [facility]   3.5h  (running 3hrs on COVID shift this week)    400/800  79kg  3K/2.25 bath  P2  Hep 3500  RIJ TDC/ no AVF    - calcitriol 0.5 ug tiw   Assessment/ Plan: 1. Stroke - CT with basal ganglia hemorrhagic stroke. Per neuro/admit 2. GI bleed- +FOBT, melena passed in ED, also ?hematemesis. Hgb stable 10's. Previously on ESA and iron. GI consulting. 3. New onset fib - no known history. Not on anticoagulation. Rate controlled. per admit 4. Recent COVID Dx- diagnosed on 12/13 and received monoclonal antibodies 12/17. Not vaccinated. Per admit 5. ESRD- On HD MWF. Last HD Friday as outpatient on schedule.No acute dialysis needs at this time. NO HEPARIN w/ possible GIB. Next HD today Monday. 6. Hypertension/volume- Blood pressure elevated, 2kg under dry wt. Will lower vol further as tol on HD based on high BP's and some edema.  7. Anemiaof CKD- Hgb 10.5. Not currently on ESA or iron. Follow trends.  8. Secondary Hyperparathyroidism -Ca at goal. Phos elevated as OP, continue binders and VDRA once eating. 9. Nutrition- currently NPO 10. Hypokalemia- on 56mEq potassium daily.    Subjective:    HD today with 1.7 L off.     Objective:   BP 140/72 (BP Location: Left Arm)   Pulse 76   Temp 98.2 F (36.8 C)   Resp 20   Wt 79.2 kg   SpO2 92%   BMI 29.97 kg/m   Physical Exam:  Not directly examined d/t COVID 19 status, observations taken from chart and from PMD observation.  Labs: BMET Recent Labs  Lab 07/12/20 0947 07/12/20 0954 07/12/20 2308 07/13/20 0412 07/14/20 0232  NA 137 136 139 137 141  K 3.9 3.9 3.9 3.8 4.3  CL 99 103 105 105 105  CO2 20*  --  19* 17* 16*  GLUCOSE 154* 152* 143* 155* 122*  BUN 72* 76* 81* 84* 90*  CREATININE 5.34* 5.30* 5.59* 5.55* 6.73*  CALCIUM 8.1*  --  7.7* 7.6* 8.0*  PHOS  --   --   --   --  8.6*   CBC Recent Labs  Lab  07/12/20 0947 07/12/20 0954 07/13/20 0412 07/14/20 0232  WBC 5.4  --  5.3 7.9  NEUTROABS 4.6  --   --   --   HGB 10.8* 10.5* 11.4* 12.2  HCT 35.4* 31.0* 34.8* 38.4  MCV 95.2  --  89.5 90.8  PLT 123*  --  92* 112*      Medications:    . chlorhexidine  15 mL Mouth Rinse BID  . Chlorhexidine Gluconate Cloth  6 each Topical Daily  . Chlorhexidine Gluconate Cloth  6 each Topical Q0600  . insulin aspart  0-6 Units Subcutaneous Q4H  . mouth rinse  15 mL Mouth Rinse q12n4p  . mupirocin ointment  1 application Nasal BID     Madelon Lips, MD 07/14/2020, 11:23 AM

## 2020-07-14 NOTE — Progress Notes (Signed)
STROKE TEAM PROGRESS NOTE      INTERVAL HISTORY Her family is not at bedside. Patient is lying in bed comfortably.  Her blood pressure is adequately controlled.  She remains afebrile with good oxygen saturations.  She has mild left hemiparesis which is stable.  No neurological issues.  Hematocrit is stable at 34.8.  She has been seen by gastroenterology recommend conservative treatment for now.   OBJECTIVE Vitals:   07/14/20 1100 07/14/20 1138 07/14/20 1200 07/14/20 1300  BP: (!) 149/80     Pulse: 77  82 81  Resp: (!) 23  19 20   Temp:  98.1 F (36.7 C)    TempSrc:  Oral    SpO2: 91%  92% 92%  Weight:        CBC:  Recent Labs  Lab 07/12/20 0947 07/12/20 0954 07/13/20 0412 07/14/20 0232  WBC 5.4  --  5.3 7.9  NEUTROABS 4.6  --   --   --   HGB 10.8*   < > 11.4* 12.2  HCT 35.4*   < > 34.8* 38.4  MCV 95.2  --  89.5 90.8  PLT 123*  --  92* 112*   < > = values in this interval not displayed.    Basic Metabolic Panel:  Recent Labs  Lab 07/12/20 2308 07/13/20 0412 07/14/20 0232  NA 139 137 141  K 3.9 3.8 4.3  CL 105 105 105  CO2 19* 17* 16*  GLUCOSE 143* 155* 122*  BUN 81* 84* 90*  CREATININE 5.59* 5.55* 6.73*  CALCIUM 7.7* 7.6* 8.0*  MG 1.6*  --  2.2  PHOS  --   --  8.6*    Lipid Panel:     Component Value Date/Time   CHOL 99 07/14/2020 0232   TRIG 178 (H) 07/14/2020 0232   HDL 31 (L) 07/14/2020 0232   CHOLHDL 3.2 07/14/2020 0232   VLDL 36 07/14/2020 0232   LDLCALC 32 07/14/2020 0232   HgbA1c:  Lab Results  Component Value Date   HGBA1C 7.9 (H) 07/13/2020   Urine Drug Screen: No results found for: LABOPIA, COCAINSCRNUR, LABBENZ, AMPHETMU, THCU, LABBARB  Alcohol Level No results found for: [facility]  IMAGING  DG Chest 1 View 07/12/2020 IMPRESSION:  Increasing patchy opacities left mid and lower lung may represent infection in the appropriate clinical setting or potentially atelectasis.   DG Pelvis Portable 07/12/2020 IMPRESSION:  No acute osseous  abnormality. No evidence of hip dislocation on this single frontal view. Moderate right hip osteoarthritis. Partially visualized left hip hemiarthroplasty and proximal right femur fixation hardware, with no evidence of hardware complication.    DG Abd Portable 1V 07/13/2020 IMPRESSION:  Unremarkable bowel gas pattern.  No acute findings.   CT HEAD CODE STROKE WO CONTRAST 07/12/2020   IMPRESSION:  1. Acute 7 mm hemorrhage within the right basal ganglia. No substantial mass effect.  2. Chronic microvascular ischemic disease and remote right cerebellar lacunar infarct.   CT HEAD WO CONTRAST -07/13/2020 expected evolutionary changes in the acute right basal ganglia hemorrhage overall similar in size with slightly decreased density compared to previous CT.  No new abnormality.  MRI / MRA Head -unchanged 1 cm right posterior lentiform nucleus/internal capsule hemorrhage without significant surrounding edema or mass-effect.  4 mm diffusion abnormality involving subcortical white matter in the right parietal corona radiata consistent with acute small vessel type infarct.   MRA of the brain technically limited due to motion artifact but no large vessel occlusion.  Focal high-grade stenosis  of the right distal M1 segment and distal left M1 segment bifurcation.  Severe multifocal bilateral P2 and distal left P3 stenosis. Transthoracic Echocardiogram -left ventricular ejection fraction 60 to 65%.  00/00/2021  Bilateral Carotid Dopplers -bilateral 1-39% stenosis of carotids    ECG - atrial fibrillation - ventricular response 100 BPM (See cardiology reading for complete details)    PHYSICAL EXAM Blood pressure (!) 149/80, pulse 81, temperature 98.1 F (36.7 C), temperature source Oral, resp. rate 20, weight 79.2 kg, SpO2 92 %.   Exam: NAD, pleasant                  Speech:    Speech is normal; fluent and spontaneous, no aphasia or dysarthria Cognition:    The patient follows simple commands,  mildly diminished attention registration and recall.   Cranial Nerves:    The pupils are equal, round, and reactive to light .EOMI. Does not blink on the left but blinks to threat on the right. Trigeminal sensation is intact and the muscles of mastication are normal. Mild left lower facial droop. The palate elevates in the midline. Hearing intact to voice. Voice is normal. Shoulder shrug is normal. The tongue has normal motion without fasciculations.   Coordination:  No dysmetria or ataxia  Motor Observation:    No asymmetry, no atrophy, and no involuntary movements noted. Tone:    Normal muscle tone and bulk.     Strength:    Strength is V/V right, mild left hemiparesis with left arm 4+/5, left leg 2/5.  Strength     Sensation: left hemisensory loss    ASSESSMENT/PLAN Ms. NIKA YAZZIE is a 79 y.o. female with history of breast cancer, DM, HLD, Covid 07/07/20, ESRD on HD (Dialysis with antibody infusion 12/17), with recent back and leg pain.  On 12/18 at ~0400 EMS called to house for GI bleed (2 days of black tarry stools and emesis) was neurologically intact and she refused transport. EMS was called again where they noticed a left sided facial droop and left leg weakness with associated decreased sensation and new onset atrial fib.  She did not receive IV t-PA due to Gi bleed.   ICH: Acute 7 mm hemorrhage within the right basal ganglia, likely hemorrhagic infarct in the setting of atrial fibrillation   code Stroke CT Head - Acute 7 mm hemorrhage within the right basal ganglia. No substantial mass effect. Chronic microvascular ischemic disease and remote right cerebellar lacunar infarct.   CT head - repeat today 12/19 see above  MRI head -see above  MRA head -  see above   CTA H&N - not ordered  CT Perfusion - not ordered  Carotid Doppler -bilateral 1-39% carotid stenosis.    2D Echo -normal ejection fraction.  No cardiac source of embolism.  Lacey Jensen Virus 2 - not  ordered - recently diagnosed as positive 07/07/20  LDL -32 mg percent   HgbA1c - 7.9  UDS - not ordered  VTE prophylaxis - SCDs Diet  Diet Order            Diet clear liquid Room service appropriate? Yes; Fluid consistency: Thin  Diet effective now                 No antithrombotic prior to admission, now on No antithrombotic  Ongoing aggressive stroke risk factor management  Therapy recommendations:  pending  Disposition:  Pending  Hypertension  Home BP meds: Norvasc ; Hydralazine ; Toprol  Current BP meds:  none - will order prn medication for BP (Hydralazine - nationwide shortage but possible AV block and sinus arrest with Labetalol)  BP mildly high . SBP goal < 140 mm Hg for first 24 hours then reassess based on imaging and pt's condition (CT head pending) . Long-term BP goal normotensive  Hyperlipidemia  Home Lipid lowering medication:  Lipitor 20 mg daily  LDL 32 mg percent goal < 70  Current lipid lowering medication: none / NPO  Continue statin at discharge  Diabetes  Home diabetic meds: Glucotrol and insulin  Current diabetic meds: SSI   HgbA1c 7.9, goal < 7.0 Recent Labs    07/14/20 0338 07/14/20 0740 07/14/20 1136  GLUCAP 114* 100* 102*    Other Stroke Risk Factors  Advanced age  Obesity, Body mass index is 29.97 kg/m., recommend weight loss, diet and exercise as appropriate   Other Active Problems, Findings and Recommendations  Code status - Full code  Covid dx 12/13 (Zithromax and Decadron PTA)  Newly diagnosed atrial fibrillation - amiodarone  Gi bleed [facility] day # 2 Patient presented with hemorrhagic right basal ganglia infarct in setting of atrial fibrillation and has recent upper GI bleeding.  Recommend start aspirin 81 mg daily for now she will eventually need to be considered for long-term anticoagulation in about 2 to 3 weeks and will need elective upper GI endoscopy to eliminate any source of recurrent  bleed prior to making decision on anticoagulation.  Discussed with Dr. Chase Caller critical care medicine I spent 35 minutes of face-to-face and non-face-to-face time with patient. This included prechart review, lab review, study review, order entry, electronic health record documentation, patient education on the different diagnostic and therapeutic options, counseling and coordination of care, risks and benefits of management, compliance, or risk factor reduction Stroke team will sign off.  Kindly call for questions. Antony Contras, MD To contact Stroke Continuity provider, please refer to [email or web address] After hours, contact General Neurology

## 2020-07-14 NOTE — Progress Notes (Signed)
[facility] Progress Note Patient Name: [name] DOB: [birth year] MRN: [id number]   Date of Service  07/14/2020  HPI/Events of Note  Patient c/o sever R hip pain again.   eICU Interventions  Plan: 1. Fentanyl 50 mcg IV Q 4 hours PRN severe pain.      Intervention Category Major Interventions: Other:  Lysle Dingwall 07/14/2020, 12:25 AM

## 2020-07-14 NOTE — Progress Notes (Signed)
   07/14/20 1040  Vitals  Temp (!) 97.5 F (36.4 C)  Temp Source Oral  BP 140/72  MAP (mmHg) 90  BP Location Left Arm  BP Method Automatic  Patient Position (if appropriate) Lying  Pulse Rate 76  ECG Heart Rate 76  Resp 20  Oxygen Therapy  SpO2 92 %  O2 Device Nasal Cannula  O2 Flow Rate (L/min) 6 L/min  Dialysis Weight  Weight 79.2 kg  Type of Weight Post-Dialysis  Post-Hemodialysis Assessment  Rinseback Volume (mL) 250 mL  KECN 279 V  Dialyzer Clearance Lightly streaked  Duration of HD Treatment -hour(s) 3.25 hour(s)  Hemodialysis Intake (mL) 500 mL  UF Total -Machine (mL) 2200 mL  Net UF (mL) 1700 mL  Tolerated HD Treatment Yes  Post-Hemodialysis Comments tx complete-pt stable  Hemodialysis Catheter Right Internal jugular Double lumen Permanent (Tunneled)  Placement Date/Time: 07/12/20 1753   Placed prior to admission: Yes  Orientation: Right  Access Location: Internal jugular  Hemodialysis Catheter Type: Double lumen Permanent (Tunneled)  Site Condition No complications  Blue Lumen Status Flushed;Capped (Central line);Heparin locked  Red Lumen Status Flushed;Capped (Central line);Heparin locked  Catheter fill solution Heparin 1000 units/ml  Catheter fill volume (Arterial) 1.9 cc  Catheter fill volume (Venous) 1.9  Dressing Type Gauze/Drain sponge  Dressing Status Clean;Dry;Intact  Antimicrobial disc in place? Yes  Interventions New dressing;Dressing changed  Drainage Description None  Dressing Change Due 07/21/20  Post treatment catheter status Capped and Clamped  HD tx complete-pt stable, UF=1.7 liters unable to meet goal due to intermittent drops below SBP parameters. UF stopped and then resumed. Pt asymptomatic, stable throughout and end of tx.

## 2020-07-15 ENCOUNTER — Inpatient Hospital Stay (HOSPITAL_COMMUNITY): Payer: Medicare HMO

## 2020-07-15 ENCOUNTER — Other Ambulatory Visit: Payer: Self-pay

## 2020-07-15 DIAGNOSIS — N186 End stage renal disease: Secondary | ICD-10-CM

## 2020-07-15 DIAGNOSIS — Z992 Dependence on renal dialysis: Secondary | ICD-10-CM

## 2020-07-15 DIAGNOSIS — I10 Essential (primary) hypertension: Secondary | ICD-10-CM

## 2020-07-15 LAB — COMPREHENSIVE METABOLIC PANEL
ALT: 24 U/L (ref 0–44)
AST: 75 U/L — ABNORMAL HIGH (ref 15–41)
Albumin: 2.2 g/dL — ABNORMAL LOW (ref 3.5–5.0)
Alkaline Phosphatase: 51 U/L (ref 38–126)
Anion gap: 16 — ABNORMAL HIGH (ref 5–15)
BUN: 41 mg/dL — ABNORMAL HIGH (ref 8–23)
CO2: 21 mmol/L — ABNORMAL LOW (ref 22–32)
Calcium: 7.7 mg/dL — ABNORMAL LOW (ref 8.9–10.3)
Chloride: 101 mmol/L (ref 98–111)
Creatinine, Ser: 4.79 mg/dL — ABNORMAL HIGH (ref 0.44–1.00)
GFR, Estimated: 9 mL/min — ABNORMAL LOW (ref >60)
Glucose, Bld: 89 mg/dL (ref 70–99)
Potassium: 3.9 mmol/L (ref 3.5–5.1)
Sodium: 138 mmol/L (ref 135–145)
Total Bilirubin: 0.8 mg/dL (ref 0.3–1.2)
Total Protein: 5.3 g/dL — ABNORMAL LOW (ref 6.5–8.1)

## 2020-07-15 LAB — MAGNESIUM: Magnesium: 1.9 mg/dL (ref 1.7–2.4)

## 2020-07-15 LAB — CBC
HCT: 35.9 % — ABNORMAL LOW (ref 36.0–46.0)
Hemoglobin: 11.7 g/dL — ABNORMAL LOW (ref 12.0–15.0)
MCH: 29.3 pg (ref 26.0–34.0)
MCHC: 32.6 g/dL (ref 30.0–36.0)
MCV: 90 fL (ref 80.0–100.0)
Platelets: 104 10*3/uL — ABNORMAL LOW (ref 150–400)
RBC: 3.99 MIL/uL (ref 3.87–5.11)
RDW: 17.5 % — ABNORMAL HIGH (ref 11.5–15.5)
WBC: 6 10*3/uL (ref 4.0–10.5)
nRBC: 0 % (ref 0.0–0.2)

## 2020-07-15 LAB — GLUCOSE, CAPILLARY
Glucose-Capillary: 116 mg/dL — ABNORMAL HIGH (ref 70–99)
Glucose-Capillary: 83 mg/dL (ref 70–99)
Glucose-Capillary: 87 mg/dL (ref 70–99)
Glucose-Capillary: 87 mg/dL (ref 70–99)
Glucose-Capillary: 94 mg/dL (ref 70–99)

## 2020-07-15 MED ORDER — ATORVASTATIN CALCIUM 10 MG PO TABS
20.0000 mg | ORAL_TABLET | Freq: Every day | ORAL | Status: DC
  Administered 2020-07-15 – 2020-07-30 (×16): 20 mg via ORAL
  Filled 2020-07-15 (×16): qty 2

## 2020-07-15 MED ORDER — SODIUM CHLORIDE 0.9% FLUSH
10.0000 mL | INTRAVENOUS | Status: DC | PRN
Start: 2020-07-15 — End: 2020-07-31

## 2020-07-15 MED ORDER — MIDODRINE HCL 5 MG PO TABS
5.0000 mg | ORAL_TABLET | Freq: Once | ORAL | Status: AC
  Administered 2020-07-15: 14:00:00 5 mg via ORAL
  Filled 2020-07-15: qty 1

## 2020-07-15 MED ORDER — AMIODARONE HCL 200 MG PO TABS
200.0000 mg | ORAL_TABLET | Freq: Every day | ORAL | Status: DC
  Administered 2020-07-15 – 2020-07-30 (×16): 200 mg via ORAL
  Filled 2020-07-15 (×17): qty 1

## 2020-07-15 MED ORDER — ALBUMIN HUMAN 25 % IV SOLN
INTRAVENOUS | Status: AC
  Administered 2020-07-15: 15:00:00 25 g
  Filled 2020-07-15: qty 100

## 2020-07-15 MED ORDER — DILTIAZEM HCL ER COATED BEADS 120 MG PO CP24
120.0000 mg | ORAL_CAPSULE | Freq: Every day | ORAL | Status: DC
  Administered 2020-07-15 – 2020-07-30 (×16): 120 mg via ORAL
  Filled 2020-07-15 (×17): qty 1

## 2020-07-15 MED ORDER — HEPARIN SODIUM (PORCINE) 1000 UNIT/ML IJ SOLN
INTRAMUSCULAR | Status: AC
  Administered 2020-07-15: 15:00:00 1000 [IU]
  Filled 2020-07-15: qty 4

## 2020-07-15 MED ORDER — DILTIAZEM HCL 60 MG PO TABS
90.0000 mg | ORAL_TABLET | Freq: Two times a day (BID) | ORAL | Status: DC
  Administered 2020-07-15: 13:00:00 90 mg via ORAL
  Filled 2020-07-15: qty 2

## 2020-07-15 MED ORDER — SODIUM CHLORIDE 0.9% FLUSH
10.0000 mL | Freq: Two times a day (BID) | INTRAVENOUS | Status: DC
  Administered 2020-07-15: 10:00:00 10 mL

## 2020-07-15 NOTE — Progress Notes (Signed)
PROGRESS NOTE                                                                                                                                                                                                             Patient Demographics:    [name], is a 79 y.o. female, DOB - 27-Oct-1940, FVC:[id number]  Outpatient Primary MD for the patient is [name], MD    LOS - 3  Admit date - 07/12/2020    Chief Complaint  Patient presents with  . Code Stroke  . GI Bleeding       Brief Narrative (HPI from H&P) - this is a 79 year old female with history of hypertension, lateral hip fractures, right-sided breast cancer s/p treatment including lumpectomy about 5 years ago, ESRD, dyslipidemia, DM type II, A. fib who was brought in the hospital on 07/12/2020 with altered mental status, was found to have ischemic stroke with hemorrhagic conversion, her stay was complicated by GI bleed and COVID-19 infection.  She was initially admitted under PCCM and was transferred to hospitalist service on 07/15/2020.  She has been seen so far by nephrology, neurology and PCCM.   Subjective:    [name] today has, No headache, No chest pain, No abdominal pain - No Nausea, No new weakness tingling or numbness, mild SOB, mild confusion other than that no subjective complaints.   Assessment  & Plan :     1. Acute right basal ganglia hemorrhagic stroke- with minimal left-sided weakness, this appears to be ischemic infarct with hemorrhagic conversion due to A. Fib.  Seen by stroke team, case discussed with Dr. Leonie Man on 07/15/2020.  For now baby aspirin along with home dose statin.  LDL was within acceptable limits, echocardiogram nonacute, A1c slightly elevated will defer for PCP for better glycemic control.  Continue PT-OT and speech therapy.  Resume anticoagulation when stable from GI standpoint.  2.  A. fib RVR.  Likely found this admission.   She is currently in and out of A. fib and RVR, have started her on Cardizem, will check TSH, echocardiogram noted with preserved EF, currently no anticoagulation due to recent bleed, discussed the case with neurology, once she is cleared by GI for anticoagulation this can be resumed.  Will obtain cardiology input here.  3. Acute GI bleed with blood loss related anemia.  Appears to be upper GI.  S/p packed RBC transfusion on 07/12/2020 1 unit, monitor H&H, PPI, GI on board.  EGD later this admission once she is medically stable.  4.  ESRD.  On MWF schedule.  Case discussed with Dr. Hollie Salk nephrologist, might get an extra done on 07/15/2020  5.  Dyslipidemia.  On statin.  6.  Essential hypertension.  For now placed on diltiazem will monitor and adjust.  7.  Mild metabolic encephalopathy.  Worsening due to recent stroke.  Supportive care.  8.  COVID-19 infection diagnosed on 07/07/2020.  S/p antibody infusion.  Monitor.    9.  History of right-sided breast cancer.  Outpatient follow-up with oncology doses 10-year-old problem.    10.  Hip pain.  Chronic.  CT scan stable.  Monitor with supportive care.  PT OT.    11. DM type II.  On sliding scale monitor and adjust.  Lab Results  Component Value Date   HGBA1C 7.9 (H) 07/13/2020    CBG (last 3)  Recent Labs    07/14/20 2321 07/15/20 0401 07/15/20 0849  GLUCAP 85 83 87    Lab Results  Component Value Date   CHOL 99 07/14/2020   HDL 31 (L) 07/14/2020   LDLCALC 32 07/14/2020   TRIG 178 (H) 07/14/2020   CHOLHDL 3.2 07/14/2020    Lab Results  Component Value Date   HGBA1C 7.9 (H) 07/13/2020          Condition - Extremely Guarded  Family Communication  :   Husband Mallie Mussel [phone] on 07/15/2020 message left at 11:36 AM.    Fredric Dine [phone] on 07/15/2020, message left 11:35 AM  Code Status :  Full  Consults  : Neurology, PCCM, nephrology, cardiology  Procedures  :    CT HEAD  07/12/2020  -   Acute  7 mm hemorrhage within the right basal ganglia. No substantial mass effect. Chronic microvascular ischemic disease and remote right cerebellar lacunar infarct.   CT HEAD WO CONTRAST - 07/13/2020 expected evolutionary changes in the acute right basal ganglia hemorrhage overall similar in size with slightly decreased density compared to previous CT.  No new abnormality.  MRI / MRA Head - unchanged 1 cm right posterior lentiform nucleus/internal capsule hemorrhage without significant surrounding edema or mass-effect.  4 mm diffusion abnormality involving subcortical white matter in the right parietal corona radiata consistent with acute small vessel type infarct.    MRA of the brain technically limited due to motion artifact but no large vessel occlusion.  Focal high-grade stenosis of the right distal M1 segment and distal left M1 segment bifurcation.  Severe multifocal bilateral P2 and distal left P3 stenosis.  Transthoracic Echocardiogram - left ventricular ejection fraction 60 to 65%.  00/00/2021  Bilateral Carotid Dopplers - bilateral 1-39% stenosis of carotids.    PUD Prophylaxis : PPI  Disposition Plan  :    Status is: Inpatient  Remains inpatient appropriate because:IV treatments appropriate due to intensity of illness or inability to take PO   Dispo: The patient is from: Home              Anticipated d/c is to: SNF              Anticipated d/c date is: > 3 days              Patient currently is not medically stable to d/c.   DVT Prophylaxis  :   SCDs    Lab Results  Component  Value Date   PLT 104 (L) 07/15/2020    Diet :  Diet Order            Diet clear liquid Room service appropriate? Yes; Fluid consistency: Thin  Diet effective now                  Inpatient Medications  Scheduled Meds: . aspirin EC  81 mg Oral Daily  . chlorhexidine  15 mL Mouth Rinse BID  . Chlorhexidine Gluconate Cloth  6 each Topical Daily  . Chlorhexidine Gluconate Cloth  6 each  Topical Q0600  . diltiazem  90 mg Oral Q12H  . insulin aspart  0-6 Units Subcutaneous Q4H  . mouth rinse  15 mL Mouth Rinse q12n4p  . mupirocin ointment  1 application Nasal BID  . pantoprazole  40 mg Oral BID   Continuous Infusions: . sodium chloride 10 mL/hr at 07/13/20 1900   PRN Meds:.sodium chloride, docusate sodium, fentaNYL (SUBLIMAZE) injection, hydrALAZINE, metoprolol tartrate, ondansetron (ZOFRAN) IV, polyethylene glycol, sodium chloride flush  Antibiotics  :    Anti-infectives (From admission, onward)   None       Time Spent in minutes  30   Lala Lund M.D on 07/15/2020 at 11:28 AM  To page go to [email or web address]   [facility] -  Office  [phone]   See all Orders from today for further details    Objective:   Vitals:   07/15/20 0900 07/15/20 1000 07/15/20 1100 07/15/20 1108  BP: (!) 113/56 120/70 140/74 (!) 141/72  Pulse: 88 95 83 82  Resp: 16 14 (!) 22 (!) 22  Temp:    99.4 F (37.4 C)  TempSrc:    Oral  SpO2: 92% 93% 96% 94%  Weight:        Wt Readings from Last 3 Encounters:  07/15/20 79.9 kg  04/21/20 77.9 kg  04/20/19 90.2 kg     Intake/Output Summary (Last 24 hours) at 07/15/2020 1128 Last data filed at 07/15/2020 1000 Gross per 24 hour  Intake 89.7 ml  Output 100 ml  Net -10.3 ml     Physical Exam  Awake mildly confused, No new F.N deficits, lack of blinking in the left eye, [place].AT,PERRAL Supple Neck,No JVD, No cervical lymphadenopathy appriciated.  Symmetrical Chest wall movement, Good air movement bilaterally, CTAB RRR,No Gallops,Rubs or new Murmurs, No Parasternal Heave +ve B.Sounds, Abd Soft, No tenderness, No organomegaly appriciated, No rebound - guarding or rigidity. No Cyanosis, Clubbing or edema, No new Rash or bruise       Data Review:    CBC Recent Labs  Lab 07/12/20 0947 07/12/20 0954 07/13/20 0412 07/14/20 0232 07/15/20 0251  WBC 5.4  --  5.3 7.9 6.0  HGB 10.8* 10.5* 11.4* 12.2 11.7*  HCT  35.4* 31.0* 34.8* 38.4 35.9*  PLT 123*  --  92* 112* 104*  MCV 95.2  --  89.5 90.8 90.0  MCH 29.0  --  29.3 28.8 29.3  MCHC 30.5  --  32.8 31.8 32.6  RDW 18.2*  --  17.6* 17.9* 17.5*  LYMPHSABS 0.4*  --   --   --   --   MONOABS 0.3  --   --   --   --   EOSABS 0.0  --   --   --   --   BASOSABS 0.1  --   --   --   --     Recent Labs  Lab 07/12/20 0947 07/12/20 0954 07/12/20 2308  07/13/20 0412 07/14/20 0232 07/15/20 0251  NA 137 136 139 137 141 138  K 3.9 3.9 3.9 3.8 4.3 3.9  CL 99 103 105 105 105 101  CO2 20*  --  19* 17* 16* 21*  GLUCOSE 154* 152* 143* 155* 122* 89  BUN 72* 76* 81* 84* 90* 41*  CREATININE 5.34* 5.30* 5.59* 5.55* 6.73* 4.79*  CALCIUM 8.1*  --  7.7* 7.6* 8.0* 7.7*  AST 51*  --   --   --   --  75*  ALT 25  --   --   --   --  24  ALKPHOS 51  --   --   --   --  51  BILITOT 0.6  --   --   --   --  0.8  ALBUMIN 2.7*  --   --   --  2.4* 2.2*  MG  --   --  1.6*  --  2.2 1.9  INR 1.1  --   --   --   --   --   HGBA1C  --   --   --  7.9*  --   --     ------------------------------------------------------------------------------------------------------------------ Recent Labs    07/14/20 0232  CHOL 99  HDL 31*  LDLCALC 32  TRIG 178*  CHOLHDL 3.2    Lab Results  Component Value Date   HGBA1C 7.9 (H) 07/13/2020   ------------------------------------------------------------------------------------------------------------------ No results for input(s): TSH, T4TOTAL, T3FREE, THYROIDAB in the last 72 hours.  Invalid input(s): FREET3  Cardiac Enzymes No results for input(s): CKMB, TROPONINI, MYOGLOBIN in the last 168 hours.  Invalid input(s): CK ------------------------------------------------------------------------------------------------------------------ No results found for: BNP  Micro Results Recent Results (from the past 240 hour(s))  MRSA PCR Screening     Status: Abnormal   Collection Time: 07/12/20  6:37 PM   Specimen: Nasal Mucosa;  Nasopharyngeal  Result Value Ref Range Status   MRSA by PCR POSITIVE (A) NEGATIVE Final    Comment:        The GeneXpert MRSA Assay (FDA approved for NASAL specimens only), is one component of a comprehensive MRSA colonization surveillance program. It is not intended to diagnose MRSA infection nor to guide or monitor treatment for MRSA infections. RESULT CALLED TO, READ BACK BY AND VERIFIED WITH: J. Dominica Severin 2139 07/12/2020 Mena Goes Performed at [facility], [place] [address] [place], [place] [address]     Radiology Reports DG Chest 1 View  Result Date: 07/12/2020 CLINICAL DATA:  Patient with history of COVID-19. History of GI bleed. EXAM: CHEST  1 VIEW COMPARISON:  Chest radiograph 07/10/2020. FINDINGS: Stable right-sided dialysis catheter. Monitoring leads overlie the patient. Stable cardiomegaly. Aortic atherosclerosis. Site oval increased patchy opacities left mid lower lung. No definite pleural effusion or pneumothorax. Thoracic spine degenerative changes. Surgical clips right axilla. IMPRESSION: Increasing patchy opacities left mid and lower lung may represent infection in the appropriate clinical setting or potentially atelectasis. Electronically Signed   By: Lovey Newcomer M.D.   On: 07/12/2020 11:24   CT HEAD WO CONTRAST  Result Date: 07/13/2020 CLINICAL DATA:  Follow-up examination of basal ganglia hemorrhage. EXAM: CT HEAD WITHOUT CONTRAST TECHNIQUE: Contiguous axial images were obtained from the base of the skull through the vertex without intravenous contrast. COMPARISON:  Prior CT from 07/12/2020 FINDINGS: Brain: Previously identified intraparenchymal hemorrhage at the posterior right lentiform nucleus again seen. Bleed has mildly dispersed and is less dense in appearance as compared to previous exam, measuring overall  similar in size at 10 x 7 x 10 mm, previously 10 x 5 x 10 mm when measured in a similar fashion. No significant surrounding edema or regional mass effect.  No other acute or interval intracranial hemorrhage. No other acute large vessel territory infarct. No mass lesion, mass effect, or midline shift. No hydrocephalus or extra-axial fluid collection. Atrophy with chronic microvascular ischemic disease again noted. Vascular: No hyperdense vessel. Calcified atherosclerosis at the skull base. Skull: Scalp soft tissues and calvarium within normal limits. Sinuses/Orbits: Globes and orbital soft tissues demonstrate no acute finding. Mild mucoperiosteal thickening noted throughout the visualized paranasal sinuses. Mastoid air cells remain clear. Other: None. IMPRESSION: 1. Normal expected interval evolution of acute intraparenchymal hemorrhage positioned at the posterior right lentiform nucleus, overall similar in size but with slightly decreased density as compared to previous. No significant surrounding edema or regional mass effect. 2. No other new acute intracranial abnormality. 3. Atrophy with chronic microvascular ischemic disease. Electronically Signed   By: Jeannine Boga M.D.   On: 07/13/2020 18:45   CT PELVIS WO CONTRAST  Result Date: 07/14/2020 CLINICAL DATA:  Right hip pain EXAM: CT PELVIS WITHOUT CONTRAST TECHNIQUE: Multidetector CT imaging of the pelvis was performed following the standard protocol without intravenous contrast. COMPARISON:  Radiographs 07/12/2020 FINDINGS: Urinary Tract: Bladder and distal ureters obscured by streak artifact from the patient's bilateral hip hardware. Bowel:  Unremarkable, rectum partially obscured by streak artifact. Vascular/Lymphatic: Aortoiliac atherosclerotic vascular disease. No pathologic adenopathy identified. Reproductive:  Unremarkable Other:  No supplemental non-categorized findings. Musculoskeletal: Right hip screw with IM nail. Single distal interlocking screw along the IM nail. Inter trochanteric deformity from old fracture, no acute fracture is identified. Severe degenerative right hip arthropathy with  loss of articular space and spurring. Left hip hemiarthroplasty without discrete complicating feature identified. Mild spurring along the SI joints. There is lower lumbar spondylosis and degenerative disc disease leading to moderate left foraminal stenosis at L5-S1. IMPRESSION: 1. Severe degenerative right hip arthropathy. 2. Right hip screw with IM nail. Right intertrochanteric deformity from old fracture. No acute fracture is identified. 3. Left hip hemiarthroplasty without discrete complicating feature identified. 4. Lower lumbar spondylosis and degenerative disc disease leading to moderate left foraminal stenosis at L5-S1. 5. Aortic atherosclerosis. Aortic Atherosclerosis (ICD10-I70.0). Electronically Signed   By: Van Clines M.D.   On: 07/14/2020 14:12   MR ANGIO HEAD WO CONTRAST  Result Date: 07/13/2020 CLINICAL DATA:  Follow-up examination for acute stroke. EXAM: MRI HEAD WITHOUT CONTRAST MRA HEAD WITHOUT CONTRAST TECHNIQUE: Multiplanar, multiecho pulse sequences of the brain and surrounding structures were obtained without intravenous contrast. Angiographic images of the head were obtained using MRA technique without contrast. COMPARISON:  Prior head CT from 07/12/2020 as well as previous brain MRI from 08/17/2016. FINDINGS: MRI HEAD FINDINGS Brain: Diffuse prominence of the CSF containing spaces compatible with generalized age-related cerebral atrophy. Patchy and confluent T2/FLAIR hyperintensity within the periventricular and deep white matter both cerebral hemispheres most likely related chronic microvascular ischemic disease. Patchy involvement of the pons. Overall, these changes are mildly progressed as compared to 2018. Few small remote lacunar type infarcts noted about the basal ganglia, most notable which present at the right caudothalamic groove. Previously identified hemorrhage centered at the posterior right lentiform nucleus/right internal capsule again seen, stable in size measuring  up to approximately 1 cm in greatest dimension. This is somewhat difficult to visualized by MR, and is most notable on T1 weighted sequence (series 16, image 26). No significant surrounding  edema or regional mass effect. No other acute intracranial hemorrhage. Few scattered chronic micro hemorrhages noted at the right cerebellum and pons, likely small vessel/hypertensive in nature. Single punctate 4 mm focus of diffusion abnormality involving the subcortical white matter of the right parietal corona radiata noted, likely a small acute to subacute small vessel type ischemic infarct (series 5, image 74). No associated hemorrhage or mass effect. No other diffusion abnormality to suggest acute or subacute ischemia. Gray-white matter differentiation otherwise maintained. No mass lesion, mass effect, or midline shift. No hydrocephalus or extra-axial fluid collection. Pituitary gland suprasellar region normal. Midline structures intact. 1.3 cm ovoid well-circumscribed lesion positioned at the left petrous apex demonstrating heterogeneous T2/FLAIR signal intensity with hyperintense T1 signal intensity noted (series 16, image 14), nonspecific, but could reflect a small cholesterol granuloma versus asymmetric fatty marrow. Finding is relatively stable from previous MRI from 2018, and of doubtful significance. Vascular: Major intracranial vascular flow voids are maintained. Skull and upper cervical spine: Craniocervical junction within normal limits. Multilevel degenerative spondylosis noted within the visualized upper cervical spine with resultant mild-to-moderate spinal stenosis. Bone marrow signal intensity within normal limits. No scalp soft tissue abnormality. Sinuses/Orbits: Patient status post bilateral ocular lens replacement. Globes and orbital soft tissues demonstrate no acute finding. Mild scattered mucosal thickening noted throughout the paranasal sinuses. Trace bilateral mastoid effusions, of doubtful  significance. Visualized nasopharynx within normal limits. Other: None. MRA HEAD FINDINGS ANTERIOR CIRCULATION: Examination degraded by motion artifact. Visualized distal cervical segments of the internal carotid arteries are patent with symmetric antegrade flow. Petrous segments widely patent. Atheromatous irregularity throughout the carotid siphons with associated mild multifocal narrowing. A1 segments patent bilaterally. Normal anterior communicating artery complex. Anterior cerebral arteries patent to their distal aspects without high-grade stenosis. Short-segment severe stenosis at the distal left M1 segment/left MCA bifurcation (series 1036, image 12). Left MCA branches are diffusely irregular but perfused distally. Right M1 segment patent proximally. Focal signal cutoff at the mid-distal right M1 segment into the bifurcation (series 1036, image 11). This likely reflects a severe high-grade stenosis, as the right MCA branches are perfused distally. Diffuse atheromatous irregularity seen throughout the right MCA branches as well. Proximally. POSTERIOR CIRCULATION: Vertebral arteries largely code dominant and patent to the vertebrobasilar junction. Both PICA origins patent and normal. Basilar patent to its distal aspect without stenosis. Superior cerebral arteries patent bilaterally. Both PCAs primarily supplied via the basilar. Severe long segment stenosis involving the mid-distal right P2 segment (series 1046, image 15). Severe multifocal distal left P2 and P3 stenoses present as well (series 1046, image 14). PCAs remain perfused to their distal aspects. No intracranial aneurysm or other vascular abnormality. IMPRESSION: MRI HEAD IMPRESSION: 1. Unchanged 1 cm hemorrhage centered at the posterior right lentiform nucleus/right internal capsule. No significant surrounding edema or regional mass effect. 2. 4 mm focus of diffusion abnormality involving the subcortical white matter of the right parietal corona  radiata, consistent with a small acute to subacute small vessel type ischemic infarct. No associated hemorrhage. 3. Underlying age-related cerebral atrophy with moderate chronic microvascular ischemic disease, mildly progressed as compared to 2018. MRA HEAD IMPRESSION: 1. Technically limited exam due to motion artifact. 2. Negative intracranial MRA for large vessel occlusion. 3. Focal severe high-grade stenoses involving the mid-distal right M1 segment and distal left M1 segment/left MCA bifurcation. Left MCA branches remain perfused distally. 4. Severe multifocal bilateral P2 and distal left P3 stenoses as above. Electronically Signed   By: Jeannine Boga M.D.   On:  07/13/2020 19:16   MR BRAIN WO CONTRAST  Result Date: 07/13/2020 CLINICAL DATA:  Follow-up examination for acute stroke. EXAM: MRI HEAD WITHOUT CONTRAST MRA HEAD WITHOUT CONTRAST TECHNIQUE: Multiplanar, multiecho pulse sequences of the brain and surrounding structures were obtained without intravenous contrast. Angiographic images of the head were obtained using MRA technique without contrast. COMPARISON:  Prior head CT from 07/12/2020 as well as previous brain MRI from 08/17/2016. FINDINGS: MRI HEAD FINDINGS Brain: Diffuse prominence of the CSF containing spaces compatible with generalized age-related cerebral atrophy. Patchy and confluent T2/FLAIR hyperintensity within the periventricular and deep white matter both cerebral hemispheres most likely related chronic microvascular ischemic disease. Patchy involvement of the pons. Overall, these changes are mildly progressed as compared to 2018. Few small remote lacunar type infarcts noted about the basal ganglia, most notable which present at the right caudothalamic groove. Previously identified hemorrhage centered at the posterior right lentiform nucleus/right internal capsule again seen, stable in size measuring up to approximately 1 cm in greatest dimension. This is somewhat difficult to  visualized by MR, and is most notable on T1 weighted sequence (series 16, image 26). No significant surrounding edema or regional mass effect. No other acute intracranial hemorrhage. Few scattered chronic micro hemorrhages noted at the right cerebellum and pons, likely small vessel/hypertensive in nature. Single punctate 4 mm focus of diffusion abnormality involving the subcortical white matter of the right parietal corona radiata noted, likely a small acute to subacute small vessel type ischemic infarct (series 5, image 74). No associated hemorrhage or mass effect. No other diffusion abnormality to suggest acute or subacute ischemia. Gray-white matter differentiation otherwise maintained. No mass lesion, mass effect, or midline shift. No hydrocephalus or extra-axial fluid collection. Pituitary gland suprasellar region normal. Midline structures intact. 1.3 cm ovoid well-circumscribed lesion positioned at the left petrous apex demonstrating heterogeneous T2/FLAIR signal intensity with hyperintense T1 signal intensity noted (series 16, image 14), nonspecific, but could reflect a small cholesterol granuloma versus asymmetric fatty marrow. Finding is relatively stable from previous MRI from 2018, and of doubtful significance. Vascular: Major intracranial vascular flow voids are maintained. Skull and upper cervical spine: Craniocervical junction within normal limits. Multilevel degenerative spondylosis noted within the visualized upper cervical spine with resultant mild-to-moderate spinal stenosis. Bone marrow signal intensity within normal limits. No scalp soft tissue abnormality. Sinuses/Orbits: Patient status post bilateral ocular lens replacement. Globes and orbital soft tissues demonstrate no acute finding. Mild scattered mucosal thickening noted throughout the paranasal sinuses. Trace bilateral mastoid effusions, of doubtful significance. Visualized nasopharynx within normal limits. Other: None. MRA HEAD FINDINGS  ANTERIOR CIRCULATION: Examination degraded by motion artifact. Visualized distal cervical segments of the internal carotid arteries are patent with symmetric antegrade flow. Petrous segments widely patent. Atheromatous irregularity throughout the carotid siphons with associated mild multifocal narrowing. A1 segments patent bilaterally. Normal anterior communicating artery complex. Anterior cerebral arteries patent to their distal aspects without high-grade stenosis. Short-segment severe stenosis at the distal left M1 segment/left MCA bifurcation (series 1036, image 12). Left MCA branches are diffusely irregular but perfused distally. Right M1 segment patent proximally. Focal signal cutoff at the mid-distal right M1 segment into the bifurcation (series 1036, image 11). This likely reflects a severe high-grade stenosis, as the right MCA branches are perfused distally. Diffuse atheromatous irregularity seen throughout the right MCA branches as well. Proximally. POSTERIOR CIRCULATION: Vertebral arteries largely code dominant and patent to the vertebrobasilar junction. Both PICA origins patent and normal. Basilar patent to its distal aspect without stenosis. Superior cerebral  arteries patent bilaterally. Both PCAs primarily supplied via the basilar. Severe long segment stenosis involving the mid-distal right P2 segment (series 1046, image 15). Severe multifocal distal left P2 and P3 stenoses present as well (series 1046, image 14). PCAs remain perfused to their distal aspects. No intracranial aneurysm or other vascular abnormality. IMPRESSION: MRI HEAD IMPRESSION: 1. Unchanged 1 cm hemorrhage centered at the posterior right lentiform nucleus/right internal capsule. No significant surrounding edema or regional mass effect. 2. 4 mm focus of diffusion abnormality involving the subcortical white matter of the right parietal corona radiata, consistent with a small acute to subacute small vessel type ischemic infarct. No  associated hemorrhage. 3. Underlying age-related cerebral atrophy with moderate chronic microvascular ischemic disease, mildly progressed as compared to 2018. MRA HEAD IMPRESSION: 1. Technically limited exam due to motion artifact. 2. Negative intracranial MRA for large vessel occlusion. 3. Focal severe high-grade stenoses involving the mid-distal right M1 segment and distal left M1 segment/left MCA bifurcation. Left MCA branches remain perfused distally. 4. Severe multifocal bilateral P2 and distal left P3 stenoses as above. Electronically Signed   By: Jeannine Boga M.D.   On: 07/13/2020 19:16   DG Pelvis Portable  Result Date: 07/12/2020 CLINICAL DATA:  Right hip pain.  No reported injury. EXAM: PORTABLE PELVIS 1-2 VIEWS COMPARISON:  07/21/2018 outside pelvic radiographs FINDINGS: Partially visualized left hip hemiarthroplasty and partially visualized fixation hardware in the proximal right femur with no evidence of hardware fracture or loosening. No evidence of hip dislocation on this single frontal view. No osseous fracture. No focal osseous lesions. Moderate right hip osteoarthritis. No suspicious focal osseous lesions. No pelvic diastasis. IMPRESSION: No acute osseous abnormality. No evidence of hip dislocation on this single frontal view. Moderate right hip osteoarthritis. Partially visualized left hip hemiarthroplasty and proximal right femur fixation hardware, with no evidence of hardware complication. Electronically Signed   By: Ilona Sorrel M.D.   On: 07/12/2020 12:33   DG CHEST PORT 1 VIEW  Result Date: 07/15/2020 CLINICAL DATA:  COVID-19. EXAM: PORTABLE CHEST 1 VIEW COMPARISON:  07/12/2020. FINDINGS: Right dual-lumen catheter stable position. Stable cardiomegaly. Progressive bilateral pulmonary infiltrates/edema. Tiny bilateral pleural effusions cannot be excluded. No pneumothorax. Degenerative change thoracic spine. Surgical clips right chest. IMPRESSION: 1. Right dual-lumen catheter  in stable position. 2. Stable cardiomegaly. 3. Progressive bilateral pulmonary infiltrates/edema. Tiny bilateral pleural effusions cannot be excluded. Electronically Signed   By: Marcello Moores  Register   On: 07/15/2020 05:24   DG Abd Portable 1V  Result Date: 07/13/2020 CLINICAL DATA:  Evaluate orogastric tube EXAM: PORTABLE ABDOMEN - 1 VIEW COMPARISON:  July 13, 2020 FINDINGS: The OG tube side port and distal tip are in the region the stomach. IMPRESSION: The OG tube is in good position. Electronically Signed   By: Dorise Bullion III M.D   On: 07/13/2020 11:04   DG Abd Portable 1V  Result Date: 07/13/2020 CLINICAL DATA:  Vomiting. EXAM: PORTABLE ABDOMEN - 1 VIEW COMPARISON:  None. FINDINGS: No evidence of dilated bowel loops. Aortic atherosclerotic calcification noted. Compression screw noted in the right hip. Left hip prosthesis is also seen. IMPRESSION: Unremarkable bowel gas pattern.  No acute findings. Electronically Signed   By: Marlaine Hind M.D.   On: 07/13/2020 05:08   ECHOCARDIOGRAM COMPLETE  Result Date: 07/14/2020    ECHOCARDIOGRAM REPORT   Patient Name:   ANBERLYN FEIMSTER Date of Exam: 07/14/2020 Medical Rec #:  [id number]     Height:       64.0 in  Accession #:    [id number]    Weight:       174.6 lb Date of Birth:  [birth year]      BSA:          1.847 m Patient Age:    28 years      BP:           114/75 mmHg Patient Gender: F             HR:           77 bpm. Exam Location:  Inpatient Procedure: 2D Echo, Color Doppler and Cardiac Doppler Indications:    Stroke 434.91 / I163.9  History:        Patient has no prior history of Echocardiogram examinations.                 Risk Factors:Dyslipidemia and Diabetes.  Sonographer:    Bernadene Person RDCS Referring Phys: [place]  1. Left ventricular ejection fraction, by estimation, is 60 to 65%. The left ventricle has normal function. The left ventricle has no regional wall motion abnormalities. There is moderate left ventricular  hypertrophy. Left ventricular diastolic parameters are consistent with Grade I diastolic dysfunction (impaired relaxation). Elevated left ventricular end-diastolic pressure. The E/e' is 109.  2. Right ventricular systolic function is normal. The right ventricular size is normal.  3. The pericardial effusion is posterior to the left ventricle.  4. The mitral valve is abnormal. Mild mitral valve regurgitation. Moderate mitral annular calcification.  5. The aortic valve is tricuspid. Aortic valve regurgitation is not visualized. Mild aortic valve sclerosis is present, with no evidence of aortic valve stenosis. Comparison(s): No prior Echocardiogram. FINDINGS  Left Ventricle: Left ventricular ejection fraction, by estimation, is 60 to 65%. The left ventricle has normal function. The left ventricle has no regional wall motion abnormalities. The left ventricular internal cavity size was normal in size. There is  moderate left ventricular hypertrophy. Left ventricular diastolic parameters are consistent with Grade I diastolic dysfunction (impaired relaxation). Elevated left ventricular end-diastolic pressure. The E/e' is 20. Right Ventricle: The right ventricular size is normal. No increase in right ventricular wall thickness. Right ventricular systolic function is normal. Left Atrium: Left atrial size was normal in size. Right Atrium: Right atrial size was normal in size. Pericardium: Trivial pericardial effusion is present. The pericardial effusion is posterior to the left ventricle. Mitral Valve: The mitral valve is abnormal. There is mild thickening of the mitral valve leaflet(s). Moderate mitral annular calcification. Mild mitral valve regurgitation, with posteriorly-directed jet. Tricuspid Valve: The tricuspid valve is grossly normal. Tricuspid valve regurgitation is trivial. Aortic Valve: The aortic valve is tricuspid. Aortic valve regurgitation is not visualized. Mild aortic valve sclerosis is present, with no  evidence of aortic valve stenosis. Pulmonic Valve: The pulmonic valve was normal in structure. Pulmonic valve regurgitation is trivial. Aorta: The aortic root and ascending aorta are structurally normal, with no evidence of dilitation. Venous: The inferior vena cava was not well visualized. IAS/Shunts: No atrial level shunt detected by color flow Doppler.  LEFT VENTRICLE PLAX 2D LVIDd:         4.30 cm  Diastology LVIDs:         2.80 cm  LV e' medial:    5.66 cm/s LV PW:         1.20 cm  LV E/e' medial:  21.9 LV IVS:        1.20 cm  LV e' lateral:  6.20 cm/s LVOT diam:     2.10 cm  LV E/e' lateral: 20.0 LV SV:         103 LV SV Index:   56 LVOT Area:     3.46 cm  RIGHT VENTRICLE RV S prime:     13.90 cm/s TAPSE (M-mode): 1.8 cm LEFT ATRIUM             Index       RIGHT ATRIUM           Index LA diam:        3.30 cm 1.79 cm/m  RA Area:     13.60 cm LA Vol (A2C):   51.0 ml 27.62 ml/m RA Volume:   28.50 ml  15.43 ml/m LA Vol (A4C):   50.1 ml 27.13 ml/m LA Biplane Vol: 51.9 ml 28.10 ml/m  AORTIC VALVE LVOT Vmax:   143.00 cm/s LVOT Vmean:  98.200 cm/s LVOT VTI:    0.296 m  AORTA Ao Root diam: 3.00 cm Ao Asc diam:  3.40 cm MITRAL VALVE                TRICUSPID VALVE MV Area (PHT): 3.21 cm     TR Peak grad:   23.2 mmHg MV Decel Time: 236 msec     TR Vmax:        241.00 cm/s MV E velocity: 124.00 cm/s MV A velocity: 120.00 cm/s  SHUNTS MV E/A ratio:  1.03         Systemic VTI:  0.30 m                             Systemic Diam: 2.10 cm Lyman Bishop MD Electronically signed by Lyman Bishop MD Signature Date/Time: 07/14/2020/2:10:04 PM    Final    CT HEAD CODE STROKE WO CONTRAST  Addendum Date: 07/12/2020   ADDENDUM REPORT: 07/12/2020 10:16 ADDENDUM: Correction.  Findings were discussed with Dr. Theda Sers. Electronically Signed   By: Margaretha Sheffield MD   On: 07/12/2020 10:16   Result Date: 07/12/2020 CLINICAL DATA:  Code stroke.  Neuro deficit, acute stroke suspected. EXAM: CT HEAD WITHOUT CONTRAST TECHNIQUE:  Contiguous axial images were obtained from the base of the skull through the vertex without intravenous contrast. COMPARISON:  MRI head August 17, 2016 FINDINGS: Brain: Acute hemorrhage in the right basal ganglia, centered in the region of the posterior putamen/posterior limb of the internal capsule. The hemorrhage measures approximately 7 x 4 by 6 mm. No evidence of acute large vascular territory infarct. Patchy white matter hypoattenuation, which is nonspecific but most likely related to chronic microvascular ischemic disease. Mild generalized atrophy. No hydrocephalus. No evidence of a mass lesion. No midline shift. Remote right cerebellar lacunar infarct. Vascular: Calcific atherosclerosis. Skull: No acute fracture. Sinuses/Orbits: Sinuses are clear.  Unremarkable orbits. Other: No mastoid effusion. IMPRESSION: 1. Acute 7 mm hemorrhage within the right basal ganglia. No substantial mass effect. 2. Chronic microvascular ischemic disease and remote right cerebellar lacunar infarct. Code stroke imaging results were communicated on 07/12/2020 at 10:00 am to provider Dr. Lorrin Goodell via telephone, who verbally acknowledged these results. Electronically Signed: By: Margaretha Sheffield MD On: 07/12/2020 10:05   VAS US CAROTID  Result Date: 07/14/2020 Carotid Arterial Duplex Study Indications:       CVA. Risk Factors:      Hyperlipidemia, Diabetes. Limitations        Today's exam was limited due to the patient's  inability or                    unwillingness to cooperate. Comparison Study:  no prior Performing Technologist: Abram Sander RVS  Examination Guidelines: A complete evaluation includes B-mode imaging, spectral Doppler, color Doppler, and power Doppler as needed of all accessible portions of each vessel. Bilateral testing is considered an integral part of a complete examination. Limited examinations for reoccurring indications may be performed as noted.  Right Carotid Findings:  +----------+--------+--------+--------+------------------+--------------+           PSV cm/sEDV cm/sStenosisPlaque DescriptionComments       +----------+--------+--------+--------+------------------+--------------+ CCA Prox  54      9               heterogenous                     +----------+--------+--------+--------+------------------+--------------+ CCA Distal65      16              heterogenous                     +----------+--------+--------+--------+------------------+--------------+ ICA Prox  48      17      1-39%   heterogenous                     +----------+--------+--------+--------+------------------+--------------+ ICA Distal                                          Not visualized +----------+--------+--------+--------+------------------+--------------+ ECA       91      9                                                +----------+--------+--------+--------+------------------+--------------+ +----------+--------+-------+--------------------------+-------------------+           PSV cm/sEDV cmsDescribe                  Arm Pressure (mmHG) +----------+--------+-------+--------------------------+-------------------+ Subclavian               not visualized due to port                    +----------+--------+-------+--------------------------+-------------------+ +---------+--------+--------+--------------+ VertebralPSV cm/sEDV cm/sNot identified +---------+--------+--------+--------------+  Left Carotid Findings: +----------+--------+--------+--------+------------------+--------------+           PSV cm/sEDV cm/sStenosisPlaque DescriptionComments       +----------+--------+--------+--------+------------------+--------------+ CCA Prox  53      5               heterogenous                     +----------+--------+--------+--------+------------------+--------------+ CCA Distal73      10              heterogenous                      +----------+--------+--------+--------+------------------+--------------+ ICA Prox  127     16      1-39%   heterogenous                     +----------+--------+--------+--------+------------------+--------------+ ICA Distal  Not visualized +----------+--------+--------+--------+------------------+--------------+ ECA       220                                                      +----------+--------+--------+--------+------------------+--------------+ +----------+--------+--------+--------------+-------------------+           PSV cm/sEDV cm/sDescribe      Arm Pressure (mmHG) +----------+--------+--------+--------------+-------------------+ Subclavian                Not identified                    +----------+--------+--------+--------------+-------------------+ +---------+--------+--------+--------------+ VertebralPSV cm/sEDV cm/sNot identified +---------+--------+--------+--------------+   Summary: Right Carotid: Velocities in the right ICA are consistent with a 1-39% stenosis. Left Carotid: Velocities in the left ICA are consistent with a 1-39% stenosis. Vertebrals: Bilateral vertebral arteries were not visualized. *See table(s) above for measurements and observations.  Electronically signed by Antony Contras MD on 07/14/2020 at 1:04:27 PM.    Final

## 2020-07-15 NOTE — Consult Note (Addendum)
Cardiology Consultation:   Due to the COVID-19 pandemic, this visit was completed with telemedicine (audio/video) technology to reduce patient and provider exposure as well as to preserve personal protective equipment.   Patient ID: [name] MRN: [id number]; DOB: 1940-10-01  Admit date: 07/12/2020 Date of Consult: 07/15/2020  Primary Care Provider: [name], MD Primary Cardiologist: New to [facility]  Patient Profile:   [name] is a 79 y.o. female with a hx of breast cancer s/p lobectomy 2012, hx of CVA with left temporal lobe infarct, ESRD on HD, DM2, HLD, and HTN who is being seen today for the evaluation of new onset atrial fibrillation with RVR at the request of Dr. Candiss Norse.  History of Present Illness:   [name] is a 79yo F with a hx as stated above who presented to [facility] on 07/12/20 as a Code Stroke. She had been diagnosed with COVID on 07/07/20 due to home exposure. She underwent antibody infusion one day prior to hospital presentation and around 4am the following morning EMS was called to her house out of concern for GI bleed. She refused transport at that time and was felt to be neurologically normal. The following morning, EMS was called once again where they noticed left sided facial droop and flaccid paralysis. She was also noted to have a large amount of black stool in her depends. On EKG she was in atrial fibrillation. Head CT showed acute 29mm hemorrhage of the right basal ganglia not a candidate for TPA or IR.  SBPs were the 80's and she was given 1L NS along with protonix with improvement in BP. She was also given 2 units PRBCs. CHA2DS2VASc calculated at 7 however due to active bleeding, she was not a candidate for anticoagulation.   Neurology was initally following with recommendations for follow up brain MRI and echocardiogram. MRI perfomred that showed unchanged hemorrhage. Echocardiogram performed 07/14/20 which showed a normal LVEF, no RWMA, LVH, G1DD, elevated EDP  at 20, posterior to the left ventricle pericardial effusion, and mild MR. Carotid dopplers with bilateral 1-39% stenosis. On neuro sign off 07/15/19 she was started on ASA 81 with long term plan for [facility] in about 2-3 weeks after elective upper endoscopy to eliminate any source of bleeding prior to final decision on AC.   Nephrology was consulted for HD guidance.   In reference to her atrial fibrillation, amiodarone infusion was ordered with load with eventual conversion to NSR. She was then transitioned to diltiazem 90mg  BID due to stable LVEF on echo. She has since been paroxysmally in and out of AF but relatively rate controlled.   Past Medical History:  Diagnosis Date  . Cancer ([place])   . Diabetes mellitus   . Hyperlipidemia     Past Surgical History:  Procedure Laterality Date  . BREAST LUMPECTOMY  03/26/2011   right    Inpatient Medications: Scheduled Meds: . aspirin EC  81 mg Oral Daily  . atorvastatin  20 mg Oral QHS  . chlorhexidine  15 mL Mouth Rinse BID  . Chlorhexidine Gluconate Cloth  6 each Topical Daily  . Chlorhexidine Gluconate Cloth  6 each Topical Q0600  . diltiazem  90 mg Oral Q12H  . insulin aspart  0-6 Units Subcutaneous Q4H  . mouth rinse  15 mL Mouth Rinse q12n4p  . midodrine  5 mg Oral Once in dialysis  . mupirocin ointment  1 application Nasal BID  . pantoprazole  40 mg Oral BID   Continuous Infusions: .  sodium chloride 10 mL/hr at 07/13/20 1900   PRN Meds: sodium chloride, docusate sodium, fentaNYL (SUBLIMAZE) injection, hydrALAZINE, metoprolol tartrate, ondansetron (ZOFRAN) IV, polyethylene glycol, sodium chloride flush  Allergies:   No Known Allergies  Social History:   Social History   Socioeconomic History  . Marital status: Married    Spouse name: Not on file  . Number of children: Not on file  . Years of education: Not on file  . Highest education level: Not on file  Occupational History  . Not on file  Tobacco Use  . Smoking status:  Never Smoker  . Smokeless tobacco: Never Used  Vaping Use  . Vaping Use: Never used  Substance and Sexual Activity  . Alcohol use: No  . Drug use: No  . Sexual activity: Not Currently  Other Topics Concern  . Not on file  Social History Narrative  . Not on file   Social Determinants of Health   Financial Resource Strain: Not on file  Food Insecurity: Not on file  Transportation Needs: Not on file  Physical Activity: Not on file  Stress: Not on file  Social Connections: Not on file  Intimate Partner Violence: Not on file    Family History:    Family History  Problem Relation Age of Onset  . Cancer Father        bronchial  . Cancer Paternal Aunt        breast  . Cancer Paternal Aunt        breast     ROS:  Please see the history of present illness.   All other ROS reviewed and negative.     Physical Exam/Data:   Vitals:   07/15/20 0900 07/15/20 1000 07/15/20 1100 07/15/20 1108  BP: (!) 113/56 120/70 140/74 (!) 141/72  Pulse: 88 95 83 82  Resp: 16 14 (!) 22 (!) 22  Temp:    99.4 F (37.4 C)  TempSrc:    Oral  SpO2: 92% 93% 96% 94%  Weight:        Intake/Output Summary (Last 24 hours) at 07/15/2020 1328 Last data filed at 07/15/2020 1000 Gross per 24 hour  Intake 79.71 ml  Output 100 ml  Net -20.29 ml   Last 3 Weights 07/15/2020 07/14/2020 07/14/2020  Weight (lbs) 176 lb 2.4 oz 174 lb 9.7 oz 174 lb 9.7 oz  Weight (kg) 79.9 kg 79.2 kg 79.2 kg     Body mass index is 30.24 kg/m.   VITAL SIGNS:  reviewed GEN:  no acute distress NEURO:  alert and oriented  EKG:  The EKG was personally reviewed and demonstrates:  07/12/20 AF with HR 100bpm  Telemetry:  Telemetry was personally reviewed and demonstrates: 07/15/20 NSR with PVCs and rates in the 70's  Relevant CV Studies:  Echocardiogram 07/14/20:  1. Left ventricular ejection fraction, by estimation, is 60 to 65%. The  left ventricle has normal function. The left ventricle has no regional  wall  motion abnormalities. There is moderate left ventricular hypertrophy.  Left ventricular diastolic  parameters are consistent with Wagner I diastolic dysfunction (impaired  relaxation). Elevated left ventricular end-diastolic pressure. The E/e' is  62.  2. Right ventricular systolic function is normal. The right ventricular  size is normal.  3. The pericardial effusion is posterior to the left ventricle.  4. The mitral valve is abnormal. Mild mitral valve regurgitation.  Moderate mitral annular calcification.  5. The aortic valve is tricuspid. Aortic valve regurgitation is not  visualized. Mild  aortic valve sclerosis is present, with no evidence of  aortic valve stenosis.   Comparison(s): No prior Echocardiogram.   Laboratory Data:  Chemistry Recent Labs  Lab 07/13/20 0412 07/14/20 0232 07/15/20 0251  NA 137 141 138  K 3.8 4.3 3.9  CL 105 105 101  CO2 17* 16* 21*  GLUCOSE 155* 122* 89  BUN 84* 90* 41*  CREATININE 5.55* 6.73* 4.79*  CALCIUM 7.6* 8.0* 7.7*  GFRNONAA 7* 6* 9*  ANIONGAP 15 20* 16*    Recent Labs  Lab 07/12/20 0947 07/14/20 0232 07/15/20 0251  PROT 6.1*  --  5.3*  ALBUMIN 2.7* 2.4* 2.2*  AST 51*  --  75*  ALT 25  --  24  ALKPHOS 51  --  51  BILITOT 0.6  --  0.8   Hematology Recent Labs  Lab 07/13/20 0412 07/14/20 0232 07/15/20 0251  WBC 5.3 7.9 6.0  RBC 3.89 4.23 3.99  HGB 11.4* 12.2 11.7*  HCT 34.8* 38.4 35.9*  MCV 89.5 90.8 90.0  MCH 29.3 28.8 29.3  MCHC 32.8 31.8 32.6  RDW 17.6* 17.9* 17.5*  PLT 92* 112* 104*   Cardiac EnzymesNo results for input(s): TROPONINI in the last 168 hours. No results for input(s): TROPIPOC in the last 168 hours.  BNPNo results for input(s): BNP, PROBNP in the last 168 hours.  DDimer No results for input(s): DDIMER in the last 168 hours.  Radiology/Studies:  DG Chest 1 View  Result Date: 07/12/2020 CLINICAL DATA:  Patient with history of COVID-19. History of GI bleed. EXAM: CHEST  1 VIEW COMPARISON:   Chest radiograph 07/10/2020. FINDINGS: Stable right-sided dialysis catheter. Monitoring leads overlie the patient. Stable cardiomegaly. Aortic atherosclerosis. Site oval increased patchy opacities left mid lower lung. No definite pleural effusion or pneumothorax. Thoracic spine degenerative changes. Surgical clips right axilla. IMPRESSION: Increasing patchy opacities left mid and lower lung may represent infection in the appropriate clinical setting or potentially atelectasis. Electronically Signed   By: Lovey Newcomer M.D.   On: 07/12/2020 11:24   CT HEAD WO CONTRAST  Result Date: 07/13/2020 CLINICAL DATA:  Follow-up examination of basal ganglia hemorrhage. EXAM: CT HEAD WITHOUT CONTRAST TECHNIQUE: Contiguous axial images were obtained from the base of the skull through the vertex without intravenous contrast. COMPARISON:  Prior CT from 07/12/2020 FINDINGS: Brain: Previously identified intraparenchymal hemorrhage at the posterior right lentiform nucleus again seen. Bleed has mildly dispersed and is less dense in appearance as compared to previous exam, measuring overall similar in size at 10 x 7 x 10 mm, previously 10 x 5 x 10 mm when measured in a similar fashion. No significant surrounding edema or regional mass effect. No other acute or interval intracranial hemorrhage. No other acute large vessel territory infarct. No mass lesion, mass effect, or midline shift. No hydrocephalus or extra-axial fluid collection. Atrophy with chronic microvascular ischemic disease again noted. Vascular: No hyperdense vessel. Calcified atherosclerosis at the skull base. Skull: Scalp soft tissues and calvarium within normal limits. Sinuses/Orbits: Globes and orbital soft tissues demonstrate no acute finding. Mild mucoperiosteal thickening noted throughout the visualized paranasal sinuses. Mastoid air cells remain clear. Other: None. IMPRESSION: 1. Normal expected interval evolution of acute intraparenchymal hemorrhage positioned  at the posterior right lentiform nucleus, overall similar in size but with slightly decreased density as compared to previous. No significant surrounding edema or regional mass effect. 2. No other new acute intracranial abnormality. 3. Atrophy with chronic microvascular ischemic disease. Electronically Signed   By: Jeannine Boga  M.D.   On: 07/13/2020 18:45   CT PELVIS WO CONTRAST  Result Date: 07/14/2020 CLINICAL DATA:  Right hip pain EXAM: CT PELVIS WITHOUT CONTRAST TECHNIQUE: Multidetector CT imaging of the pelvis was performed following the standard protocol without intravenous contrast. COMPARISON:  Radiographs 07/12/2020 FINDINGS: Urinary Tract: Bladder and distal ureters obscured by streak artifact from the patient's bilateral hip hardware. Bowel:  Unremarkable, rectum partially obscured by streak artifact. Vascular/Lymphatic: Aortoiliac atherosclerotic vascular disease. No pathologic adenopathy identified. Reproductive:  Unremarkable Other:  No supplemental non-categorized findings. Musculoskeletal: Right hip screw with IM nail. Single distal interlocking screw along the IM nail. Inter trochanteric deformity from old fracture, no acute fracture is identified. Severe degenerative right hip arthropathy with loss of articular space and spurring. Left hip hemiarthroplasty without discrete complicating feature identified. Mild spurring along the SI joints. There is lower lumbar spondylosis and degenerative disc disease leading to moderate left foraminal stenosis at L5-S1. IMPRESSION: 1. Severe degenerative right hip arthropathy. 2. Right hip screw with IM nail. Right intertrochanteric deformity from old fracture. No acute fracture is identified. 3. Left hip hemiarthroplasty without discrete complicating feature identified. 4. Lower lumbar spondylosis and degenerative disc disease leading to moderate left foraminal stenosis at L5-S1. 5. Aortic atherosclerosis. Aortic Atherosclerosis (ICD10-I70.0).  Electronically Signed   By: Van Clines M.D.   On: 07/14/2020 14:12   MR ANGIO HEAD WO CONTRAST  Result Date: 07/13/2020 CLINICAL DATA:  Follow-up examination for acute stroke. EXAM: MRI HEAD WITHOUT CONTRAST MRA HEAD WITHOUT CONTRAST TECHNIQUE: Multiplanar, multiecho pulse sequences of the brain and surrounding structures were obtained without intravenous contrast. Angiographic images of the head were obtained using MRA technique without contrast. COMPARISON:  Prior head CT from 07/12/2020 as well as previous brain MRI from 08/17/2016. FINDINGS: MRI HEAD FINDINGS Brain: Diffuse prominence of the CSF containing spaces compatible with generalized age-related cerebral atrophy. Patchy and confluent T2/FLAIR hyperintensity within the periventricular and deep white matter both cerebral hemispheres most likely related chronic microvascular ischemic disease. Patchy involvement of the pons. Overall, these changes are mildly progressed as compared to 2018. Few small remote lacunar type infarcts noted about the basal ganglia, most notable which present at the right caudothalamic groove. Previously identified hemorrhage centered at the posterior right lentiform nucleus/right internal capsule again seen, stable in size measuring up to approximately 1 cm in greatest dimension. This is somewhat difficult to visualized by MR, and is most notable on T1 weighted sequence (series 16, image 26). No significant surrounding edema or regional mass effect. No other acute intracranial hemorrhage. Few scattered chronic micro hemorrhages noted at the right cerebellum and pons, likely small vessel/hypertensive in nature. Single punctate 4 mm focus of diffusion abnormality involving the subcortical white matter of the right parietal corona radiata noted, likely a small acute to subacute small vessel type ischemic infarct (series 5, image 74). No associated hemorrhage or mass effect. No other diffusion abnormality to suggest acute  or subacute ischemia. Gray-white matter differentiation otherwise maintained. No mass lesion, mass effect, or midline shift. No hydrocephalus or extra-axial fluid collection. Pituitary gland suprasellar region normal. Midline structures intact. 1.3 cm ovoid well-circumscribed lesion positioned at the left petrous apex demonstrating heterogeneous T2/FLAIR signal intensity with hyperintense T1 signal intensity noted (series 16, image 14), nonspecific, but could reflect a small cholesterol granuloma versus asymmetric fatty marrow. Finding is relatively stable from previous MRI from 2018, and of doubtful significance. Vascular: Major intracranial vascular flow voids are maintained. Skull and upper cervical spine: Craniocervical junction within normal  limits. Multilevel degenerative spondylosis noted within the visualized upper cervical spine with resultant mild-to-moderate spinal stenosis. Bone marrow signal intensity within normal limits. No scalp soft tissue abnormality. Sinuses/Orbits: Patient status post bilateral ocular lens replacement. Globes and orbital soft tissues demonstrate no acute finding. Mild scattered mucosal thickening noted throughout the paranasal sinuses. Trace bilateral mastoid effusions, of doubtful significance. Visualized nasopharynx within normal limits. Other: None. MRA HEAD FINDINGS ANTERIOR CIRCULATION: Examination degraded by motion artifact. Visualized distal cervical segments of the internal carotid arteries are patent with symmetric antegrade flow. Petrous segments widely patent. Atheromatous irregularity throughout the carotid siphons with associated mild multifocal narrowing. A1 segments patent bilaterally. Normal anterior communicating artery complex. Anterior cerebral arteries patent to their distal aspects without high-Wagner stenosis. Short-segment severe stenosis at the distal left M1 segment/left MCA bifurcation (series 1036, image 12). Left MCA branches are diffusely irregular  but perfused distally. Right M1 segment patent proximally. Focal signal cutoff at the mid-distal right M1 segment into the bifurcation (series 1036, image 11). This likely reflects a severe high-Wagner stenosis, as the right MCA branches are perfused distally. Diffuse atheromatous irregularity seen throughout the right MCA branches as well. Proximally. POSTERIOR CIRCULATION: Vertebral arteries largely code dominant and patent to the vertebrobasilar junction. Both PICA origins patent and normal. Basilar patent to its distal aspect without stenosis. Superior cerebral arteries patent bilaterally. Both PCAs primarily supplied via the basilar. Severe long segment stenosis involving the mid-distal right P2 segment (series 1046, image 15). Severe multifocal distal left P2 and P3 stenoses present as well (series 1046, image 14). PCAs remain perfused to their distal aspects. No intracranial aneurysm or other vascular abnormality. IMPRESSION: MRI HEAD IMPRESSION: 1. Unchanged 1 cm hemorrhage centered at the posterior right lentiform nucleus/right internal capsule. No significant surrounding edema or regional mass effect. 2. 4 mm focus of diffusion abnormality involving the subcortical white matter of the right parietal corona radiata, consistent with a small acute to subacute small vessel type ischemic infarct. No associated hemorrhage. 3. Underlying age-related cerebral atrophy with moderate chronic microvascular ischemic disease, mildly progressed as compared to 2018. MRA HEAD IMPRESSION: 1. Technically limited exam due to motion artifact. 2. Negative intracranial MRA for large vessel occlusion. 3. Focal severe high-Wagner stenoses involving the mid-distal right M1 segment and distal left M1 segment/left MCA bifurcation. Left MCA branches remain perfused distally. 4. Severe multifocal bilateral P2 and distal left P3 stenoses as above. Electronically Signed   By: Jeannine Boga M.D.   On: 07/13/2020 19:16   MR BRAIN WO  CONTRAST  Result Date: 07/13/2020 CLINICAL DATA:  Follow-up examination for acute stroke. EXAM: MRI HEAD WITHOUT CONTRAST MRA HEAD WITHOUT CONTRAST TECHNIQUE: Multiplanar, multiecho pulse sequences of the brain and surrounding structures were obtained without intravenous contrast. Angiographic images of the head were obtained using MRA technique without contrast. COMPARISON:  Prior head CT from 07/12/2020 as well as previous brain MRI from 08/17/2016. FINDINGS: MRI HEAD FINDINGS Brain: Diffuse prominence of the CSF containing spaces compatible with generalized age-related cerebral atrophy. Patchy and confluent T2/FLAIR hyperintensity within the periventricular and deep white matter both cerebral hemispheres most likely related chronic microvascular ischemic disease. Patchy involvement of the pons. Overall, these changes are mildly progressed as compared to 2018. Few small remote lacunar type infarcts noted about the basal ganglia, most notable which present at the right caudothalamic groove. Previously identified hemorrhage centered at the posterior right lentiform nucleus/right internal capsule again seen, stable in size measuring up to approximately 1 cm in greatest dimension.  This is somewhat difficult to visualized by MR, and is most notable on T1 weighted sequence (series 16, image 26). No significant surrounding edema or regional mass effect. No other acute intracranial hemorrhage. Few scattered chronic micro hemorrhages noted at the right cerebellum and pons, likely small vessel/hypertensive in nature. Single punctate 4 mm focus of diffusion abnormality involving the subcortical white matter of the right parietal corona radiata noted, likely a small acute to subacute small vessel type ischemic infarct (series 5, image 74). No associated hemorrhage or mass effect. No other diffusion abnormality to suggest acute or subacute ischemia. Gray-white matter differentiation otherwise maintained. No mass lesion,  mass effect, or midline shift. No hydrocephalus or extra-axial fluid collection. Pituitary gland suprasellar region normal. Midline structures intact. 1.3 cm ovoid well-circumscribed lesion positioned at the left petrous apex demonstrating heterogeneous T2/FLAIR signal intensity with hyperintense T1 signal intensity noted (series 16, image 14), nonspecific, but could reflect a small cholesterol granuloma versus asymmetric fatty marrow. Finding is relatively stable from previous MRI from 2018, and of doubtful significance. Vascular: Major intracranial vascular flow voids are maintained. Skull and upper cervical spine: Craniocervical junction within normal limits. Multilevel degenerative spondylosis noted within the visualized upper cervical spine with resultant mild-to-moderate spinal stenosis. Bone marrow signal intensity within normal limits. No scalp soft tissue abnormality. Sinuses/Orbits: Patient status post bilateral ocular lens replacement. Globes and orbital soft tissues demonstrate no acute finding. Mild scattered mucosal thickening noted throughout the paranasal sinuses. Trace bilateral mastoid effusions, of doubtful significance. Visualized nasopharynx within normal limits. Other: None. MRA HEAD FINDINGS ANTERIOR CIRCULATION: Examination degraded by motion artifact. Visualized distal cervical segments of the internal carotid arteries are patent with symmetric antegrade flow. Petrous segments widely patent. Atheromatous irregularity throughout the carotid siphons with associated mild multifocal narrowing. A1 segments patent bilaterally. Normal anterior communicating artery complex. Anterior cerebral arteries patent to their distal aspects without high-Wagner stenosis. Short-segment severe stenosis at the distal left M1 segment/left MCA bifurcation (series 1036, image 12). Left MCA branches are diffusely irregular but perfused distally. Right M1 segment patent proximally. Focal signal cutoff at the mid-distal  right M1 segment into the bifurcation (series 1036, image 11). This likely reflects a severe high-Wagner stenosis, as the right MCA branches are perfused distally. Diffuse atheromatous irregularity seen throughout the right MCA branches as well. Proximally. POSTERIOR CIRCULATION: Vertebral arteries largely code dominant and patent to the vertebrobasilar junction. Both PICA origins patent and normal. Basilar patent to its distal aspect without stenosis. Superior cerebral arteries patent bilaterally. Both PCAs primarily supplied via the basilar. Severe long segment stenosis involving the mid-distal right P2 segment (series 1046, image 15). Severe multifocal distal left P2 and P3 stenoses present as well (series 1046, image 14). PCAs remain perfused to their distal aspects. No intracranial aneurysm or other vascular abnormality. IMPRESSION: MRI HEAD IMPRESSION: 1. Unchanged 1 cm hemorrhage centered at the posterior right lentiform nucleus/right internal capsule. No significant surrounding edema or regional mass effect. 2. 4 mm focus of diffusion abnormality involving the subcortical white matter of the right parietal corona radiata, consistent with a small acute to subacute small vessel type ischemic infarct. No associated hemorrhage. 3. Underlying age-related cerebral atrophy with moderate chronic microvascular ischemic disease, mildly progressed as compared to 2018. MRA HEAD IMPRESSION: 1. Technically limited exam due to motion artifact. 2. Negative intracranial MRA for large vessel occlusion. 3. Focal severe high-Wagner stenoses involving the mid-distal right M1 segment and distal left M1 segment/left MCA bifurcation. Left MCA branches remain perfused distally. 4.  Severe multifocal bilateral P2 and distal left P3 stenoses as above. Electronically Signed   By: Jeannine Boga M.D.   On: 07/13/2020 19:16   DG Pelvis Portable  Result Date: 07/12/2020 CLINICAL DATA:  Right hip pain.  No reported injury. EXAM:  PORTABLE PELVIS 1-2 VIEWS COMPARISON:  07/21/2018 outside pelvic radiographs FINDINGS: Partially visualized left hip hemiarthroplasty and partially visualized fixation hardware in the proximal right femur with no evidence of hardware fracture or loosening. No evidence of hip dislocation on this single frontal view. No osseous fracture. No focal osseous lesions. Moderate right hip osteoarthritis. No suspicious focal osseous lesions. No pelvic diastasis. IMPRESSION: No acute osseous abnormality. No evidence of hip dislocation on this single frontal view. Moderate right hip osteoarthritis. Partially visualized left hip hemiarthroplasty and proximal right femur fixation hardware, with no evidence of hardware complication. Electronically Signed   By: Ilona Sorrel M.D.   On: 07/12/2020 12:33   DG CHEST PORT 1 VIEW  Result Date: 07/15/2020 CLINICAL DATA:  COVID-19. EXAM: PORTABLE CHEST 1 VIEW COMPARISON:  07/12/2020. FINDINGS: Right dual-lumen catheter stable position. Stable cardiomegaly. Progressive bilateral pulmonary infiltrates/edema. Tiny bilateral pleural effusions cannot be excluded. No pneumothorax. Degenerative change thoracic spine. Surgical clips right chest. IMPRESSION: 1. Right dual-lumen catheter in stable position. 2. Stable cardiomegaly. 3. Progressive bilateral pulmonary infiltrates/edema. Tiny bilateral pleural effusions cannot be excluded. Electronically Signed   By: Marcello Moores  Register   On: 07/15/2020 05:24   DG Abd Portable 1V  Result Date: 07/13/2020 CLINICAL DATA:  Evaluate orogastric tube EXAM: PORTABLE ABDOMEN - 1 VIEW COMPARISON:  July 13, 2020 FINDINGS: The OG tube side port and distal tip are in the region the stomach. IMPRESSION: The OG tube is in good position. Electronically Signed   By: Dorise Bullion III M.D   On: 07/13/2020 11:04   DG Abd Portable 1V  Result Date: 07/13/2020 CLINICAL DATA:  Vomiting. EXAM: PORTABLE ABDOMEN - 1 VIEW COMPARISON:  None. FINDINGS: No  evidence of dilated bowel loops. Aortic atherosclerotic calcification noted. Compression screw noted in the right hip. Left hip prosthesis is also seen. IMPRESSION: Unremarkable bowel gas pattern.  No acute findings. Electronically Signed   By: Marlaine Hind M.D.   On: 07/13/2020 05:08   ECHOCARDIOGRAM COMPLETE  Result Date: 07/14/2020    ECHOCARDIOGRAM REPORT   Patient Name:   [name] Date of Exam: 07/14/2020 Medical Rec #:  [id number]     Height:       64.0 in Accession #:    [id number]    Weight:       174.6 lb Date of Birth:  12/06/1940      BSA:          1.847 m Patient Age:    79 years      BP:           114/75 mmHg Patient Gender: F             HR:           77 bpm. Exam Location:  Inpatient Procedure: 2D Echo, Color Doppler and Cardiac Doppler Indications:    Stroke 434.91 / I163.9  History:        Patient has no prior history of Echocardiogram examinations.                 Risk Factors:Dyslipidemia and Diabetes.  Sonographer:    Bernadene Person RDCS Referring Phys: [place]  1. Left ventricular ejection fraction, by estimation,  is 60 to 65%. The left ventricle has normal function. The left ventricle has no regional wall motion abnormalities. There is moderate left ventricular hypertrophy. Left ventricular diastolic parameters are consistent with Wagner I diastolic dysfunction (impaired relaxation). Elevated left ventricular end-diastolic pressure. The E/e' is 77.  2. Right ventricular systolic function is normal. The right ventricular size is normal.  3. The pericardial effusion is posterior to the left ventricle.  4. The mitral valve is abnormal. Mild mitral valve regurgitation. Moderate mitral annular calcification.  5. The aortic valve is tricuspid. Aortic valve regurgitation is not visualized. Mild aortic valve sclerosis is present, with no evidence of aortic valve stenosis. Comparison(s): No prior Echocardiogram. FINDINGS  Left Ventricle: Left ventricular ejection  fraction, by estimation, is 60 to 65%. The left ventricle has normal function. The left ventricle has no regional wall motion abnormalities. The left ventricular internal cavity size was normal in size. There is  moderate left ventricular hypertrophy. Left ventricular diastolic parameters are consistent with Wagner I diastolic dysfunction (impaired relaxation). Elevated left ventricular end-diastolic pressure. The E/e' is 20. Right Ventricle: The right ventricular size is normal. No increase in right ventricular wall thickness. Right ventricular systolic function is normal. Left Atrium: Left atrial size was normal in size. Right Atrium: Right atrial size was normal in size. Pericardium: Trivial pericardial effusion is present. The pericardial effusion is posterior to the left ventricle. Mitral Valve: The mitral valve is abnormal. There is mild thickening of the mitral valve leaflet(s). Moderate mitral annular calcification. Mild mitral valve regurgitation, with posteriorly-directed jet. Tricuspid Valve: The tricuspid valve is grossly normal. Tricuspid valve regurgitation is trivial. Aortic Valve: The aortic valve is tricuspid. Aortic valve regurgitation is not visualized. Mild aortic valve sclerosis is present, with no evidence of aortic valve stenosis. Pulmonic Valve: The pulmonic valve was normal in structure. Pulmonic valve regurgitation is trivial. Aorta: The aortic root and ascending aorta are structurally normal, with no evidence of dilitation. Venous: The inferior vena cava was not well visualized. IAS/Shunts: No atrial level shunt detected by color flow Doppler.  LEFT VENTRICLE PLAX 2D LVIDd:         4.30 cm  Diastology LVIDs:         2.80 cm  LV e' medial:    5.66 cm/s LV PW:         1.20 cm  LV E/e' medial:  21.9 LV IVS:        1.20 cm  LV e' lateral:   6.20 cm/s LVOT diam:     2.10 cm  LV E/e' lateral: 20.0 LV SV:         103 LV SV Index:   56 LVOT Area:     3.46 cm  RIGHT VENTRICLE RV S prime:     13.90  cm/s TAPSE (M-mode): 1.8 cm LEFT ATRIUM             Index       RIGHT ATRIUM           Index LA diam:        3.30 cm 1.79 cm/m  RA Area:     13.60 cm LA Vol (A2C):   51.0 ml 27.62 ml/m RA Volume:   28.50 ml  15.43 ml/m LA Vol (A4C):   50.1 ml 27.13 ml/m LA Biplane Vol: 51.9 ml 28.10 ml/m  AORTIC VALVE LVOT Vmax:   143.00 cm/s LVOT Vmean:  98.200 cm/s LVOT VTI:    0.296 m  AORTA Ao Root diam:  3.00 cm Ao Asc diam:  3.40 cm MITRAL VALVE                TRICUSPID VALVE MV Area (PHT): 3.21 cm     TR Peak grad:   23.2 mmHg MV Decel Time: 236 msec     TR Vmax:        241.00 cm/s MV E velocity: 124.00 cm/s MV A velocity: 120.00 cm/s  SHUNTS MV E/A ratio:  1.03         Systemic VTI:  0.30 m                             Systemic Diam: 2.10 cm Lyman Bishop MD Electronically signed by Lyman Bishop MD Signature Date/Time: 07/14/2020/2:10:04 PM    Final    CT HEAD CODE STROKE WO CONTRAST  Addendum Date: 07/12/2020   ADDENDUM REPORT: 07/12/2020 10:16 ADDENDUM: Correction.  Findings were discussed with Dr. Theda Sers. Electronically Signed   By: Margaretha Sheffield MD   On: 07/12/2020 10:16   Result Date: 07/12/2020 CLINICAL DATA:  Code stroke.  Neuro deficit, acute stroke suspected. EXAM: CT HEAD WITHOUT CONTRAST TECHNIQUE: Contiguous axial images were obtained from the base of the skull through the vertex without intravenous contrast. COMPARISON:  MRI head August 17, 2016 FINDINGS: Brain: Acute hemorrhage in the right basal ganglia, centered in the region of the posterior putamen/posterior limb of the internal capsule. The hemorrhage measures approximately 7 x 4 by 6 mm. No evidence of acute large vascular territory infarct. Patchy white matter hypoattenuation, which is nonspecific but most likely related to chronic microvascular ischemic disease. Mild generalized atrophy. No hydrocephalus. No evidence of a mass lesion. No midline shift. Remote right cerebellar lacunar infarct. Vascular: Calcific atherosclerosis.  Skull: No acute fracture. Sinuses/Orbits: Sinuses are clear.  Unremarkable orbits. Other: No mastoid effusion. IMPRESSION: 1. Acute 7 mm hemorrhage within the right basal ganglia. No substantial mass effect. 2. Chronic microvascular ischemic disease and remote right cerebellar lacunar infarct. Code stroke imaging results were communicated on 07/12/2020 at 10:00 am to provider Dr. Lorrin Goodell via telephone, who verbally acknowledged these results. Electronically Signed: By: Margaretha Sheffield MD On: 07/12/2020 10:05   VAS US CAROTID  Result Date: 07/14/2020 Carotid Arterial Duplex Study Indications:       CVA. Risk Factors:      Hyperlipidemia, Diabetes. Limitations        Today's exam was limited due to the patient's inability or                    unwillingness to cooperate. Comparison Study:  no prior Performing Technologist: Abram Sander RVS  Examination Guidelines: A complete evaluation includes B-mode imaging, spectral Doppler, color Doppler, and power Doppler as needed of all accessible portions of each vessel. Bilateral testing is considered an integral part of a complete examination. Limited examinations for reoccurring indications may be performed as noted.  Right Carotid Findings: +----------+--------+--------+--------+------------------+--------------+           PSV cm/sEDV cm/sStenosisPlaque DescriptionComments       +----------+--------+--------+--------+------------------+--------------+ CCA Prox  54      9               heterogenous                     +----------+--------+--------+--------+------------------+--------------+ CCA Distal65      16  heterogenous                     +----------+--------+--------+--------+------------------+--------------+ ICA Prox  48      17      1-39%   heterogenous                     +----------+--------+--------+--------+------------------+--------------+ ICA Distal                                          Not  visualized +----------+--------+--------+--------+------------------+--------------+ ECA       91      9                                                +----------+--------+--------+--------+------------------+--------------+ +----------+--------+-------+--------------------------+-------------------+           PSV cm/sEDV cmsDescribe                  Arm Pressure (mmHG) +----------+--------+-------+--------------------------+-------------------+ Subclavian               not visualized due to port                    +----------+--------+-------+--------------------------+-------------------+ +---------+--------+--------+--------------+ VertebralPSV cm/sEDV cm/sNot identified +---------+--------+--------+--------------+  Left Carotid Findings: +----------+--------+--------+--------+------------------+--------------+           PSV cm/sEDV cm/sStenosisPlaque DescriptionComments       +----------+--------+--------+--------+------------------+--------------+ CCA Prox  53      5               heterogenous                     +----------+--------+--------+--------+------------------+--------------+ CCA Distal73      10              heterogenous                     +----------+--------+--------+--------+------------------+--------------+ ICA Prox  127     16      1-39%   heterogenous                     +----------+--------+--------+--------+------------------+--------------+ ICA Distal                                          Not visualized +----------+--------+--------+--------+------------------+--------------+ ECA       220                                                      +----------+--------+--------+--------+------------------+--------------+ +----------+--------+--------+--------------+-------------------+           PSV cm/sEDV cm/sDescribe      Arm Pressure (mmHG) +----------+--------+--------+--------------+-------------------+  Subclavian                Not identified                    +----------+--------+--------+--------------+-------------------+ +---------+--------+--------+--------------+ VertebralPSV cm/sEDV cm/sNot identified +---------+--------+--------+--------------+   Summary: Right Carotid: Velocities in the right ICA are consistent with a 1-39% stenosis. Left Carotid:  Velocities in the left ICA are consistent with a 1-39% stenosis. Vertebrals: Bilateral vertebral arteries were not visualized. *See table(s) above for measurements and observations.  Electronically signed by Antony Contras MD on 07/14/2020 at 1:04:27 PM.    Final    Assessment and Plan:   1. New onset paroxysmal atrial  AF: -Pt presented to [facility] on 07/12/20 as a Code Stroke found to be in atrial fibrillation. She had been diagnosed with COVID on 07/07/20 and underwent antibody infusion one day prior to hospital presentation. Around 4am the following morning EMS was called to her house out of concern for GI bleed. She refused transport at that time and was felt to be neurologically normal. The following morning, EMS was called once again where they noticed left sided facial droop and flaccid paralysis. She was also noted to have a large amount of black stool in her depends. -Head CT showed acute 89mm hemorrhage of the right basal ganglia not a candidate for TPA or IR.  -SBPs were the 80's and she was given 1L NS along with protonix with improvement in BP. She was also given 2 units PRBCs.  -CHA2DS2VASc calculated at 7 however due to active bleeding, she was not a candidate for anticoagulation.  -She was initially placed on IV Amiodarone infusion which was eventually transitioned to PO Diltiazem 90mg  BID. She has been intermittently in and out of AF.  -Echocardiogram performed 07/14/20 which showed a normal LVEF, no RWMA, LVH, G1DD, elevated EDP at 20, posterior to the left ventricle pericardial effusion, and mild MR.  -Carotid dopplers with  bilateral 1-39% stenosis.  -At neurology sign off 07/14/20 recommendations were to  start aspirin 81 mg daily for now and will eventually need to be considered for long-term anticoagulation in about 2 to 3 weeks after elective upper GI endoscopy to eliminate any source of recurrent bleed prior to making decision on anticoagulation. -For now cardiology will follow to ensure adequate rate control  -Plan to transition to Diltiazem CD 120mg  pO QD and add PO Amio 200mg  QD for now. Plan to follow with cardiology in the OP setting after acute infection to determine long term goals for [facility]   2. Acute GI bleed: -Treated with PRBCs x2 on 07/12/20 and protonix -Plan for OP endoscopy to r/o GI bleeding source once COVID infection clears -Hb today at 11.7 -Management per GI/IM   3. Acute hemorrhagic CVA: -Pt presented with right facial droop and flaccid paralysis worrisome for acute CVA.  -Head CT showed acute 33mm hemorrhage of the right basal ganglia not a candidate for TPA or IR.  -SBPs were the 80's and she was given 1L NS along with protonix with improvement in BP. She was also given 2 units PRBCs.  -Neurology was initally following with recommendations for follow up brain MRI and echocardiogram. MRI perfomred that showed unchanged hemorrhage. Echocardiogram performed 07/14/20 which showed a normal LVEF, no RWMA, LVH, G1DD, elevated EDP at 20, posterior to the left ventricle pericardial effusion, and mild MR. Carotid dopplers with bilateral 1-39% stenosis. Om neuro sign off 07/15/19 she was started on ASA 81 with long term plan for [facility] in about 2-3 weeks after elective upper endoscopy to eliminate any source of bleeding prior to final decision on AC.   4. ESRD: -Maintained on HD MWF>>nephrology following    For questions or updates, please contact [place] Please consult [email or web address] for contact info under     Signed, Kathyrn Drown, NP  07/15/2020 1:28 PM  Agree with note by Kathyrn Drown  NP  We were asked to see this 79 year old female with multiple medical problems including chronic renal disease on hemodialysis, hypertension, diabetes, hyperlipidemia admitted with stroke, GI bleed and A. fib after having had COVID-19 and receiving medical antibody infusion.  She is received packed blood cell transfusion.  She does have a right basal ganglia bleed.  She was found to be in A. fib and was placed on amiodarone and diltiazem.  She apparently has converted to sinus rhythm.  She is not a candidate for anticoagulation.  I do not think she will need long-term anticoagulation once her medical other medical problems stabilize given the fact that the A. fib was in setting of COVID-19.  Can continue to treat with amnio p.o. consolidate diltiazem to CD 120.  We will continue to follow at a distance.   [name], M.D., [place], [facility], [place] [address] [place], [place]  [id number]  [phone] 07/15/2020 3:10 PM

## 2020-07-15 NOTE — Progress Notes (Signed)
Daily Rounding Note  07/15/2020, 11:48 AM  LOS: 3 days   SUBJECTIVE:   Chief complaint:       No further melena, dark stools, nausea, vomiting.  Hungry on clears.  Overall breathing is better.  Still on 6 L [place] oxygen.  OBJECTIVE:         Vital signs in last 24 hours:    Temp:  [97.9 F (36.6 C)-99.4 F (37.4 C)] 99.4 F (37.4 C) (12/21 1108) Pulse Rate:  [71-104] 82 (12/21 1108) Resp:  [7-24] 22 (12/21 1108) BP: (103-163)/(56-141) 141/72 (12/21 1108) SpO2:  [89 %-96 %] 94 % (12/21 1108) Weight:  [79.9 kg] 79.9 kg (12/21 0407) Last BM Date: 07/13/20 Filed Weights   07/14/20 0715 07/14/20 1040 07/15/20 0407  Weight: 79.2 kg 79.2 kg 79.9 kg   Spoke w pt on phone   Heart: RRR  Chest: coughing during speaking Neuro/Psych:  Alert, NAD.  Oriented x 3.  Appropriate.    Intake/Output from previous day: 12/20 0701 - 12/21 0700 In: 129.7 [I.V.:129.7] Out: 1750 [Urine:50]  Intake/Output this shift: Total I/O In: -  Out: 100 [Urine:100]  Lab Results: Recent Labs    07/13/20 0412 07/14/20 0232 07/15/20 0251  WBC 5.3 7.9 6.0  HGB 11.4* 12.2 11.7*  HCT 34.8* 38.4 35.9*  PLT 92* 112* 104*   BMET Recent Labs    07/13/20 0412 07/14/20 0232 07/15/20 0251  NA 137 141 138  K 3.8 4.3 3.9  CL 105 105 101  CO2 17* 16* 21*  GLUCOSE 155* 122* 89  BUN 84* 90* 41*  CREATININE 5.55* 6.73* 4.79*  CALCIUM 7.6* 8.0* 7.7*   LFT Recent Labs    07/14/20 0232 07/15/20 0251  PROT  --  5.3*  ALBUMIN 2.4* 2.2*  AST  --  75*  ALT  --  24  ALKPHOS  --  51  BILITOT  --  0.8   PT/INR No results for input(s): LABPROT, INR in the last 72 hours. Hepatitis Panel Recent Labs    07/14/20 0745  HEPBSAG NON REACTIVE    Studies/Results: CT HEAD WO CONTRAST  Result Date: 07/13/2020 CLINICAL DATA:  Follow-up examination of basal ganglia hemorrhage. EXAM: CT HEAD WITHOUT CONTRAST TECHNIQUE: Contiguous axial images  were obtained from the base of the skull through the vertex without intravenous contrast. COMPARISON:  Prior CT from 07/12/2020 FINDINGS: Brain: Previously identified intraparenchymal hemorrhage at the posterior right lentiform nucleus again seen. Bleed has mildly dispersed and is less dense in appearance as compared to previous exam, measuring overall similar in size at 10 x 7 x 10 mm, previously 10 x 5 x 10 mm when measured in a similar fashion. No significant surrounding edema or regional mass effect. No other acute or interval intracranial hemorrhage. No other acute large vessel territory infarct. No mass lesion, mass effect, or midline shift. No hydrocephalus or extra-axial fluid collection. Atrophy with chronic microvascular ischemic disease again noted. Vascular: No hyperdense vessel. Calcified atherosclerosis at the skull base. Skull: Scalp soft tissues and calvarium within normal limits. Sinuses/Orbits: Globes and orbital soft tissues demonstrate no acute finding. Mild mucoperiosteal thickening noted throughout the visualized paranasal sinuses. Mastoid air cells remain clear. Other: None. IMPRESSION: 1. Normal expected interval evolution of acute intraparenchymal hemorrhage positioned at the posterior right lentiform nucleus, overall similar in size but with slightly decreased density as compared to previous. No significant surrounding edema or regional mass effect. 2. No other new acute  intracranial abnormality. 3. Atrophy with chronic microvascular ischemic disease. Electronically Signed   By: Jeannine Boga M.D.   On: 07/13/2020 18:45   CT PELVIS WO CONTRAST  Result Date: 07/14/2020 CLINICAL DATA:  Right hip pain EXAM: CT PELVIS WITHOUT CONTRAST TECHNIQUE: Multidetector CT imaging of the pelvis was performed following the standard protocol without intravenous contrast. COMPARISON:  Radiographs 07/12/2020 FINDINGS: Urinary Tract: Bladder and distal ureters obscured by streak artifact from the  patient's bilateral hip hardware. Bowel:  Unremarkable, rectum partially obscured by streak artifact. Vascular/Lymphatic: Aortoiliac atherosclerotic vascular disease. No pathologic adenopathy identified. Reproductive:  Unremarkable Other:  No supplemental non-categorized findings. Musculoskeletal: Right hip screw with IM nail. Single distal interlocking screw along the IM nail. Inter trochanteric deformity from old fracture, no acute fracture is identified. Severe degenerative right hip arthropathy with loss of articular space and spurring. Left hip hemiarthroplasty without discrete complicating feature identified. Mild spurring along the SI joints. There is lower lumbar spondylosis and degenerative disc disease leading to moderate left foraminal stenosis at L5-S1. IMPRESSION: 1. Severe degenerative right hip arthropathy. 2. Right hip screw with IM nail. Right intertrochanteric deformity from old fracture. No acute fracture is identified. 3. Left hip hemiarthroplasty without discrete complicating feature identified. 4. Lower lumbar spondylosis and degenerative disc disease leading to moderate left foraminal stenosis at L5-S1. 5. Aortic atherosclerosis. Aortic Atherosclerosis (ICD10-I70.0). Electronically Signed   By: Van Clines M.D.   On: 07/14/2020 14:12   MR ANGIO HEAD WO CONTRAST  Result Date: 07/13/2020 CLINICAL DATA:  Follow-up examination for acute stroke. EXAM: MRI HEAD WITHOUT CONTRAST MRA HEAD WITHOUT CONTRAST TECHNIQUE: Multiplanar, multiecho pulse sequences of the brain and surrounding structures were obtained without intravenous contrast. Angiographic images of the head were obtained using MRA technique without contrast. COMPARISON:  Prior head CT from 07/12/2020 as well as previous brain MRI from 08/17/2016. FINDINGS: MRI HEAD FINDINGS Brain: Diffuse prominence of the CSF containing spaces compatible with generalized age-related cerebral atrophy. Patchy and confluent T2/FLAIR hyperintensity  within the periventricular and deep white matter both cerebral hemispheres most likely related chronic microvascular ischemic disease. Patchy involvement of the pons. Overall, these changes are mildly progressed as compared to 2018. Few small remote lacunar type infarcts noted about the basal ganglia, most notable which present at the right caudothalamic groove. Previously identified hemorrhage centered at the posterior right lentiform nucleus/right internal capsule again seen, stable in size measuring up to approximately 1 cm in greatest dimension. This is somewhat difficult to visualized by MR, and is most notable on T1 weighted sequence (series 16, image 26). No significant surrounding edema or regional mass effect. No other acute intracranial hemorrhage. Few scattered chronic micro hemorrhages noted at the right cerebellum and pons, likely small vessel/hypertensive in nature. Single punctate 4 mm focus of diffusion abnormality involving the subcortical white matter of the right parietal corona radiata noted, likely a small acute to subacute small vessel type ischemic infarct (series 5, image 74). No associated hemorrhage or mass effect. No other diffusion abnormality to suggest acute or subacute ischemia. Gray-white matter differentiation otherwise maintained. No mass lesion, mass effect, or midline shift. No hydrocephalus or extra-axial fluid collection. Pituitary gland suprasellar region normal. Midline structures intact. 1.3 cm ovoid well-circumscribed lesion positioned at the left petrous apex demonstrating heterogeneous T2/FLAIR signal intensity with hyperintense T1 signal intensity noted (series 16, image 14), nonspecific, but could reflect a small cholesterol granuloma versus asymmetric fatty marrow. Finding is relatively stable from previous MRI from 2018, and of doubtful significance.  Vascular: Major intracranial vascular flow voids are maintained. Skull and upper cervical spine: Craniocervical junction  within normal limits. Multilevel degenerative spondylosis noted within the visualized upper cervical spine with resultant mild-to-moderate spinal stenosis. Bone marrow signal intensity within normal limits. No scalp soft tissue abnormality. Sinuses/Orbits: Patient status post bilateral ocular lens replacement. Globes and orbital soft tissues demonstrate no acute finding. Mild scattered mucosal thickening noted throughout the paranasal sinuses. Trace bilateral mastoid effusions, of doubtful significance. Visualized nasopharynx within normal limits. Other: None. MRA HEAD FINDINGS ANTERIOR CIRCULATION: Examination degraded by motion artifact. Visualized distal cervical segments of the internal carotid arteries are patent with symmetric antegrade flow. Petrous segments widely patent. Atheromatous irregularity throughout the carotid siphons with associated mild multifocal narrowing. A1 segments patent bilaterally. Normal anterior communicating artery complex. Anterior cerebral arteries patent to their distal aspects without high-grade stenosis. Short-segment severe stenosis at the distal left M1 segment/left MCA bifurcation (series 1036, image 12). Left MCA branches are diffusely irregular but perfused distally. Right M1 segment patent proximally. Focal signal cutoff at the mid-distal right M1 segment into the bifurcation (series 1036, image 11). This likely reflects a severe high-grade stenosis, as the right MCA branches are perfused distally. Diffuse atheromatous irregularity seen throughout the right MCA branches as well. Proximally. POSTERIOR CIRCULATION: Vertebral arteries largely code dominant and patent to the vertebrobasilar junction. Both PICA origins patent and normal. Basilar patent to its distal aspect without stenosis. Superior cerebral arteries patent bilaterally. Both PCAs primarily supplied via the basilar. Severe long segment stenosis involving the mid-distal right P2 segment (series 1046, image 15).  Severe multifocal distal left P2 and P3 stenoses present as well (series 1046, image 14). PCAs remain perfused to their distal aspects. No intracranial aneurysm or other vascular abnormality. IMPRESSION: MRI HEAD IMPRESSION: 1. Unchanged 1 cm hemorrhage centered at the posterior right lentiform nucleus/right internal capsule. No significant surrounding edema or regional mass effect. 2. 4 mm focus of diffusion abnormality involving the subcortical white matter of the right parietal corona radiata, consistent with a small acute to subacute small vessel type ischemic infarct. No associated hemorrhage. 3. Underlying age-related cerebral atrophy with moderate chronic microvascular ischemic disease, mildly progressed as compared to 2018. MRA HEAD IMPRESSION: 1. Technically limited exam due to motion artifact. 2. Negative intracranial MRA for large vessel occlusion. 3. Focal severe high-grade stenoses involving the mid-distal right M1 segment and distal left M1 segment/left MCA bifurcation. Left MCA branches remain perfused distally. 4. Severe multifocal bilateral P2 and distal left P3 stenoses as above. Electronically Signed   By: Jeannine Boga M.D.   On: 07/13/2020 19:16   MR BRAIN WO CONTRAST  Result Date: 07/13/2020 CLINICAL DATA:  Follow-up examination for acute stroke. EXAM: MRI HEAD WITHOUT CONTRAST MRA HEAD WITHOUT CONTRAST TECHNIQUE: Multiplanar, multiecho pulse sequences of the brain and surrounding structures were obtained without intravenous contrast. Angiographic images of the head were obtained using MRA technique without contrast. COMPARISON:  Prior head CT from 07/12/2020 as well as previous brain MRI from 08/17/2016. FINDINGS: MRI HEAD FINDINGS Brain: Diffuse prominence of the CSF containing spaces compatible with generalized age-related cerebral atrophy. Patchy and confluent T2/FLAIR hyperintensity within the periventricular and deep white matter both cerebral hemispheres most likely related  chronic microvascular ischemic disease. Patchy involvement of the pons. Overall, these changes are mildly progressed as compared to 2018. Few small remote lacunar type infarcts noted about the basal ganglia, most notable which present at the right caudothalamic groove. Previously identified hemorrhage centered at the posterior right  lentiform nucleus/right internal capsule again seen, stable in size measuring up to approximately 1 cm in greatest dimension. This is somewhat difficult to visualized by MR, and is most notable on T1 weighted sequence (series 16, image 26). No significant surrounding edema or regional mass effect. No other acute intracranial hemorrhage. Few scattered chronic micro hemorrhages noted at the right cerebellum and pons, likely small vessel/hypertensive in nature. Single punctate 4 mm focus of diffusion abnormality involving the subcortical white matter of the right parietal corona radiata noted, likely a small acute to subacute small vessel type ischemic infarct (series 5, image 74). No associated hemorrhage or mass effect. No other diffusion abnormality to suggest acute or subacute ischemia. Gray-white matter differentiation otherwise maintained. No mass lesion, mass effect, or midline shift. No hydrocephalus or extra-axial fluid collection. Pituitary gland suprasellar region normal. Midline structures intact. 1.3 cm ovoid well-circumscribed lesion positioned at the left petrous apex demonstrating heterogeneous T2/FLAIR signal intensity with hyperintense T1 signal intensity noted (series 16, image 14), nonspecific, but could reflect a small cholesterol granuloma versus asymmetric fatty marrow. Finding is relatively stable from previous MRI from 2018, and of doubtful significance. Vascular: Major intracranial vascular flow voids are maintained. Skull and upper cervical spine: Craniocervical junction within normal limits. Multilevel degenerative spondylosis noted within the visualized upper  cervical spine with resultant mild-to-moderate spinal stenosis. Bone marrow signal intensity within normal limits. No scalp soft tissue abnormality. Sinuses/Orbits: Patient status post bilateral ocular lens replacement. Globes and orbital soft tissues demonstrate no acute finding. Mild scattered mucosal thickening noted throughout the paranasal sinuses. Trace bilateral mastoid effusions, of doubtful significance. Visualized nasopharynx within normal limits. Other: None. MRA HEAD FINDINGS ANTERIOR CIRCULATION: Examination degraded by motion artifact. Visualized distal cervical segments of the internal carotid arteries are patent with symmetric antegrade flow. Petrous segments widely patent. Atheromatous irregularity throughout the carotid siphons with associated mild multifocal narrowing. A1 segments patent bilaterally. Normal anterior communicating artery complex. Anterior cerebral arteries patent to their distal aspects without high-grade stenosis. Short-segment severe stenosis at the distal left M1 segment/left MCA bifurcation (series 1036, image 12). Left MCA branches are diffusely irregular but perfused distally. Right M1 segment patent proximally. Focal signal cutoff at the mid-distal right M1 segment into the bifurcation (series 1036, image 11). This likely reflects a severe high-grade stenosis, as the right MCA branches are perfused distally. Diffuse atheromatous irregularity seen throughout the right MCA branches as well. Proximally. POSTERIOR CIRCULATION: Vertebral arteries largely code dominant and patent to the vertebrobasilar junction. Both PICA origins patent and normal. Basilar patent to its distal aspect without stenosis. Superior cerebral arteries patent bilaterally. Both PCAs primarily supplied via the basilar. Severe long segment stenosis involving the mid-distal right P2 segment (series 1046, image 15). Severe multifocal distal left P2 and P3 stenoses present as well (series 1046, image 14). PCAs  remain perfused to their distal aspects. No intracranial aneurysm or other vascular abnormality. IMPRESSION: MRI HEAD IMPRESSION: 1. Unchanged 1 cm hemorrhage centered at the posterior right lentiform nucleus/right internal capsule. No significant surrounding edema or regional mass effect. 2. 4 mm focus of diffusion abnormality involving the subcortical white matter of the right parietal corona radiata, consistent with a small acute to subacute small vessel type ischemic infarct. No associated hemorrhage. 3. Underlying age-related cerebral atrophy with moderate chronic microvascular ischemic disease, mildly progressed as compared to 2018. MRA HEAD IMPRESSION: 1. Technically limited exam due to motion artifact. 2. Negative intracranial MRA for large vessel occlusion. 3. Focal severe high-grade stenoses involving the  mid-distal right M1 segment and distal left M1 segment/left MCA bifurcation. Left MCA branches remain perfused distally. 4. Severe multifocal bilateral P2 and distal left P3 stenoses as above. Electronically Signed   By: Jeannine Boga M.D.   On: 07/13/2020 19:16   DG CHEST PORT 1 VIEW  Result Date: 07/15/2020 CLINICAL DATA:  COVID-19. EXAM: PORTABLE CHEST 1 VIEW COMPARISON:  07/12/2020. FINDINGS: Right dual-lumen catheter stable position. Stable cardiomegaly. Progressive bilateral pulmonary infiltrates/edema. Tiny bilateral pleural effusions cannot be excluded. No pneumothorax. Degenerative change thoracic spine. Surgical clips right chest. IMPRESSION: 1. Right dual-lumen catheter in stable position. 2. Stable cardiomegaly. 3. Progressive bilateral pulmonary infiltrates/edema. Tiny bilateral pleural effusions cannot be excluded. Electronically Signed   By: Marcello Moores  Register   On: 07/15/2020 05:24   ECHOCARDIOGRAM COMPLETE  Result Date: 07/14/2020 IMPRESSIONS  1. Left ventricular ejection fraction, by estimation, is 60 to 65%. The left ventricle has normal function. The left ventricle has  no regional wall motion abnormalities. There is moderate left ventricular hypertrophy. Left ventricular diastolic parameters are consistent with Grade I diastolic dysfunction (impaired relaxation). Elevated left ventricular end-diastolic pressure. The E/e' is 49.  2. Right ventricular systolic function is normal. The right ventricular size is normal.  3. The pericardial effusion is posterior to the left ventricle.  4. The mitral valve is abnormal. Mild mitral valve regurgitation. Moderate mitral annular calcification.  5. The aortic valve is tricuspid. Aortic valve regurgitation is not visualized. Mild aortic valve sclerosis is present, with no evidence of aortic valve stenosis. Comparison(s): No prior Echocardiogram. Electronically signed by Lyman Bishop MD Signature Date/Time: 07/14/2020/2:10:04 PM    Final    VAS US CAROTID  Result Date: 07/14/2020 Summary: Right Carotid: Velocities in the right ICA are consistent with a 1-39% stenosis. Left Carotid: Velocities in the left ICA are consistent with a 1-39% stenosis. Vertebrals: Bilateral vertebral arteries were not visualized.   Electronically signed by Antony Contras MD on 07/14/2020 at 1:04:27 PM.    Final     ASSESMENT:   *    CGE, black stools, self-limited. Now on twice daily oral Protonix, IV Protonix finished 12/20.  *     Normocytic anemia.  Hgb stable.  2 PRBCs on 12/18.    *   Acute hemorrhagic CVA, stable per fup CT  *     COVID-19 positive.  Received monoclonal antibody infusion 12/17. Remains on contact precautions. CXR w pulm edema.  Oxygen sats lower to mid 90s on 6 L nasal cannula oxygen.  *     ESRD.  MWF HD  *    A. Fib/RVR.  Not on AC.  Rates overnight 100 - 120, treating w Metoprolol.     PLAN   *   EGD tomorrow 4 PM w Dr Carlean Purl.  D/w pt, she is agreeable.      Azucena Freed  07/15/2020, 11:48 AM Phone [phone]

## 2020-07-15 NOTE — Progress Notes (Signed)
[facility] Progress Note   Assessment/ Plan:     OP HD: MWF [facility]   3.5h  (running 3hrs on COVID shift this week)    400/800  79kg  3K/2.25 bath  P2  Hep 3500  RIJ TDC/ no AVF    - calcitriol 0.5 ug tiw   Assessment/ Plan: 1. Stroke - CT with basal ganglia hemorrhagic stroke. Per neuro/admit 2. GI bleed- +FOBT, melena passed in ED, also ?hematemesis. Hgb stable 10's. Previously on ESA and iron. GI consulting. 3. New onset fib - no known history. Not on anticoagulation. Rate controlled. per admit 4. Recent COVID Dx- diagnosed on 12/13 and received monoclonal antibodies 12/17. Not vaccinated. Per admit 5. ESRD- On HD MWF. HD Monday, has pulm edema, extra HD today- no heparin 6. Hypertension/volume- Blood pressure elevated, 2kg under dry wt. Will lower vol further as tol on HD based on high BP's and some edema.  7. Anemiaof CKD- Hgb 10.5. Not currently on ESA or iron. Follow trends.  8. Secondary Hyperparathyroidism -Ca at goal. Phos elevated as OP, continue binders and VDRA once eating. 9. Nutrition- currently NPO 10. Hypokalemia- on 24mEq potassium daily.    Subjective:    Seen in room.  CXR with progressive pulm edema.  Got 1.7L off yesterday.    Objective:   BP (!) 141/72 (BP Location: Left Arm)   Pulse 82   Temp 99.4 F (37.4 C) (Oral)   Resp (!) 22   Wt 79.9 kg   SpO2 94%   BMI 30.24 kg/m   Physical Exam:    GEN confused, sitting in bed NECK + JVD PULM bilateral wheezing CV RRR ABD soft, obese EXT trace LE edema NEURO confused SKIN warm and dry ACCESS: [facility]  Labs: BMET Recent Labs  Lab 07/12/20 0947 07/12/20 0954 07/12/20 2308 07/13/20 0412 07/14/20 0232 07/15/20 0251  NA 137 136 139 137 141 138  K 3.9 3.9 3.9 3.8 4.3 3.9  CL 99 103 105 105 105 101  CO2 20*  --  19* 17* 16* 21*  GLUCOSE 154* 152* 143* 155* 122* 89  BUN 72* 76* 81* 84* 90* 41*  CREATININE 5.34* 5.30* 5.59* 5.55* 6.73* 4.79*  CALCIUM  8.1*  --  7.7* 7.6* 8.0* 7.7*  PHOS  --   --   --   --  8.6*  --    CBC Recent Labs  Lab 07/12/20 0947 07/12/20 0954 07/13/20 0412 07/14/20 0232 07/15/20 0251  WBC 5.4  --  5.3 7.9 6.0  NEUTROABS 4.6  --   --   --   --   HGB 10.8* 10.5* 11.4* 12.2 11.7*  HCT 35.4* 31.0* 34.8* 38.4 35.9*  MCV 95.2  --  89.5 90.8 90.0  PLT 123*  --  92* 112* 104*      Medications:    . aspirin EC  81 mg Oral Daily  . atorvastatin  20 mg Oral QHS  . chlorhexidine  15 mL Mouth Rinse BID  . Chlorhexidine Gluconate Cloth  6 each Topical Daily  . Chlorhexidine Gluconate Cloth  6 each Topical Q0600  . diltiazem  90 mg Oral Q12H  . insulin aspart  0-6 Units Subcutaneous Q4H  . mouth rinse  15 mL Mouth Rinse q12n4p  . midodrine  5 mg Oral Once in dialysis  . mupirocin ointment  1 application Nasal BID  . pantoprazole  40 mg Oral BID     Madelon Lips, MD 07/15/2020, 1:02 PM

## 2020-07-15 NOTE — Progress Notes (Signed)
[place] Progress Note Patient Name: CORDELL COKE DOB: 10-18-1940 MRN: [id number]   Date of Service  07/15/2020  HPI/Events of Note  AFIB with RVR - Ventricular rate = 100-120. K+ = 3.9 and Creatinine = 4.79. BP = 116/69. Will not replace K+ given renal failure.   eICU Interventions  Plan: 1. Please give already ordered Metoprolol. 2. Add Mg++ level to previously drawn lab work     Intervention Category Major Interventions: Arrhythmia - evaluation and management  Lysle Dingwall 07/15/2020, 5:36 AM

## 2020-07-16 ENCOUNTER — Inpatient Hospital Stay (HOSPITAL_COMMUNITY): Payer: Medicare HMO

## 2020-07-16 LAB — COMPREHENSIVE METABOLIC PANEL
ALT: 22 U/L (ref 0–44)
AST: 50 U/L — ABNORMAL HIGH (ref 15–41)
Albumin: 2.5 g/dL — ABNORMAL LOW (ref 3.5–5.0)
Alkaline Phosphatase: 49 U/L (ref 38–126)
Anion gap: 14 (ref 5–15)
BUN: 24 mg/dL — ABNORMAL HIGH (ref 8–23)
CO2: 24 mmol/L (ref 22–32)
Calcium: 7.5 mg/dL — ABNORMAL LOW (ref 8.9–10.3)
Chloride: 100 mmol/L (ref 98–111)
Creatinine, Ser: 4.12 mg/dL — ABNORMAL HIGH (ref 0.44–1.00)
GFR, Estimated: 11 mL/min — ABNORMAL LOW (ref >60)
Glucose, Bld: 86 mg/dL (ref 70–99)
Potassium: 3 mmol/L — ABNORMAL LOW (ref 3.5–5.1)
Sodium: 138 mmol/L (ref 135–145)
Total Bilirubin: 0.9 mg/dL (ref 0.3–1.2)
Total Protein: 5.5 g/dL — ABNORMAL LOW (ref 6.5–8.1)

## 2020-07-16 LAB — CBC WITH DIFFERENTIAL/PLATELET
Abs Immature Granulocytes: 0.09 10*3/uL — ABNORMAL HIGH (ref 0.00–0.07)
Basophils Absolute: 0 10*3/uL (ref 0.0–0.1)
Basophils Relative: 0 %
Eosinophils Absolute: 0 10*3/uL (ref 0.0–0.5)
Eosinophils Relative: 0 %
HCT: 32.6 % — ABNORMAL LOW (ref 36.0–46.0)
Hemoglobin: 10.7 g/dL — ABNORMAL LOW (ref 12.0–15.0)
Immature Granulocytes: 2 %
Lymphocytes Relative: 18 %
Lymphs Abs: 0.8 10*3/uL (ref 0.7–4.0)
MCH: 30.1 pg (ref 26.0–34.0)
MCHC: 32.8 g/dL (ref 30.0–36.0)
MCV: 91.6 fL (ref 80.0–100.0)
Monocytes Absolute: 0.7 10*3/uL (ref 0.1–1.0)
Monocytes Relative: 15 %
Neutro Abs: 2.9 10*3/uL (ref 1.7–7.7)
Neutrophils Relative %: 65 %
Platelets: 113 10*3/uL — ABNORMAL LOW (ref 150–400)
RBC: 3.56 MIL/uL — ABNORMAL LOW (ref 3.87–5.11)
RDW: 17.6 % — ABNORMAL HIGH (ref 11.5–15.5)
WBC: 4.6 10*3/uL (ref 4.0–10.5)
nRBC: 0 % (ref 0.0–0.2)

## 2020-07-16 LAB — GLUCOSE, CAPILLARY
Glucose-Capillary: 87 mg/dL (ref 70–99)
Glucose-Capillary: 93 mg/dL (ref 70–99)
Glucose-Capillary: 166 mg/dL — ABNORMAL HIGH (ref 70–99)
Glucose-Capillary: 206 mg/dL — ABNORMAL HIGH (ref 70–99)
Glucose-Capillary: 225 mg/dL — ABNORMAL HIGH (ref 70–99)

## 2020-07-16 LAB — TSH: TSH: 1.219 u[IU]/mL (ref 0.350–4.500)

## 2020-07-16 LAB — MAGNESIUM: Magnesium: 1.9 mg/dL (ref 1.7–2.4)

## 2020-07-16 LAB — C-REACTIVE PROTEIN: CRP: 12.5 mg/dL — ABNORMAL HIGH (ref <1.0)

## 2020-07-16 LAB — BRAIN NATRIURETIC PEPTIDE: B Natriuretic Peptide: 325.5 pg/mL — ABNORMAL HIGH (ref 0.0–100.0)

## 2020-07-16 LAB — D-DIMER, QUANTITATIVE: D-Dimer, Quant: 1.54 ug/mL-FEU — ABNORMAL HIGH (ref 0.00–0.50)

## 2020-07-16 MED ORDER — GUAIFENESIN-DM 100-10 MG/5ML PO SYRP
10.0000 mL | ORAL_SOLUTION | Freq: Four times a day (QID) | ORAL | Status: DC | PRN
  Administered 2020-07-16 – 2020-07-24 (×5): 10 mL via ORAL
  Filled 2020-07-16 (×6): qty 10

## 2020-07-16 MED ORDER — POTASSIUM CHLORIDE CRYS ER 10 MEQ PO TBCR
10.0000 meq | EXTENDED_RELEASE_TABLET | Freq: Once | ORAL | Status: AC
  Administered 2020-07-16: 09:00:00 10 meq via ORAL
  Filled 2020-07-16: qty 1

## 2020-07-16 MED ORDER — HYDRALAZINE HCL 50 MG PO TABS
50.0000 mg | ORAL_TABLET | Freq: Three times a day (TID) | ORAL | Status: DC
  Administered 2020-07-16 (×2): 50 mg via ORAL
  Filled 2020-07-16 (×2): qty 1

## 2020-07-16 NOTE — Progress Notes (Signed)
We were asked to see Ms. Dunn for A. fib in the setting of COVID-19.  She had a GI bleed and CNS bleed as well.  She is not an anticoagulation candidate.  She did receive amiodarone and diltiazem.  She converted to sinus rhythm.  We will continue current medications.  No further recommendations at this time.  We will see back as needed.  [name], M.D., [place], [facility], [place] [address]. [place], [place]  [address]  [phone] 07/16/2020 9:15 AM

## 2020-07-16 NOTE — Progress Notes (Signed)
Ok to eat   Azucena Freed PA-C.                                                               Daily Rounding Note  07/16/2020, 12:49 PM  LOS: 4 days   SUBJECTIVE:   Chief complaint:    CGE.  Black stools.  covid 19  Can not recall her last BM but none yest or today. Oxygen sats in mid to upper 90s on 6 L NCO2, now on 2.5 L NCO2 w sats of 92%. Fair po intake of solid food.  No abd pain, no nausea.      OBJECTIVE:         Vital signs in last 24 hours:    Temp:  [98.3 F (36.8 C)-98.9 F (37.2 C)] 98.9 F (37.2 C) (12/22 1208) Pulse Rate:  [62-87] 87 (12/22 1208) Resp:  [17-20] 20 (12/22 1208) BP: (102-151)/(54-99) 143/99 (12/22 1208) SpO2:  [92 %-99 %] 92 % (12/22 1208) Weight:  [76 kg-79.9 kg] 76 kg (12/22 0414) Last BM Date: 07/13/20 Filed Weights   07/15/20 1440 07/15/20 1745 07/16/20 0414  Weight: 79.9 kg 77.5 kg 76 kg   General: looks moderately unwell.  Alert, comfortable,  coughing   Heart: RRR.  NSR in 80s on monitor Chest: diminished BS.  Slightly mucoid cough.  No dyspnea Abdomen: soft, active BS, NT, ND  Extremities: Trace LE edema Neuro/Psych:  Oriented x 3.  Appropriate.     Intake/Output from previous day: 12/21 0701 - 12/22 0700 In: -  Out: 2600 [Urine:100]  Intake/Output this shift: No intake/output data recorded.  Lab Results: Recent Labs    07/14/20 0232 07/15/20 0251 07/16/20 0146  WBC 7.9 6.0 4.6  HGB 12.2 11.7* 10.7*  HCT 38.4 35.9* 32.6*  PLT 112* 104* 113*   BMET Recent Labs    07/14/20 0232 07/15/20 0251 07/16/20 0146  NA 141 138 138  K 4.3 3.9 3.0*  CL 105 101 100  CO2 16* 21* 24  GLUCOSE 122* 89 86  BUN 90* 41* 24*  CREATININE 6.73* 4.79* 4.12*  CALCIUM 8.0* 7.7* 7.5*   LFT Recent Labs    07/14/20 0232 07/15/20 0251 07/16/20 0146  PROT  --  5.3* 5.5*  ALBUMIN 2.4* 2.2* 2.5*  AST  --  75* 50*  ALT  --  24 22  ALKPHOS  --  51 49  BILITOT  --  0.8 0.9   PT/INR No results for input(s):  LABPROT, INR in the last 72 hours. Hepatitis Panel Recent Labs    07/14/20 0745  HEPBSAG NON REACTIVE    Studies/Results: CT PELVIS WO CONTRAST  Result Date: 07/14/2020 CLINICAL DATA:  Right hip pain EXAM: CT PELVIS WITHOUT CONTRAST TECHNIQUE: Multidetector CT imaging of the pelvis was performed following the standard protocol without intravenous contrast. COMPARISON:  Radiographs 07/12/2020 FINDINGS: Urinary Tract: Bladder and distal ureters obscured by streak artifact from the patient's bilateral hip hardware. Bowel:  Unremarkable, rectum partially obscured by streak artifact. Vascular/Lymphatic: Aortoiliac atherosclerotic vascular disease. No pathologic adenopathy identified. Reproductive:  Unremarkable Other:  No supplemental non-categorized findings. Musculoskeletal: Right hip screw with IM nail. Single distal interlocking screw along the IM nail. Inter trochanteric deformity from old fracture, no  acute fracture is identified. Severe degenerative right hip arthropathy with loss of articular space and spurring. Left hip hemiarthroplasty without discrete complicating feature identified. Mild spurring along the SI joints. There is lower lumbar spondylosis and degenerative disc disease leading to moderate left foraminal stenosis at L5-S1. IMPRESSION: 1. Severe degenerative right hip arthropathy. 2. Right hip screw with IM nail. Right intertrochanteric deformity from old fracture. No acute fracture is identified. 3. Left hip hemiarthroplasty without discrete complicating feature identified. 4. Lower lumbar spondylosis and degenerative disc disease leading to moderate left foraminal stenosis at L5-S1. 5. Aortic atherosclerosis. Aortic Atherosclerosis (ICD10-I70.0). Electronically Signed   By: Van Clines M.D.   On: 07/14/2020 14:12   DG CHEST PORT 1 VIEW  Result Date: 07/15/2020 CLINICAL DATA:  COVID-19. EXAM: PORTABLE CHEST 1 VIEW COMPARISON:  07/12/2020. FINDINGS: Right dual-lumen catheter  stable position. Stable cardiomegaly. Progressive bilateral pulmonary infiltrates/edema. Tiny bilateral pleural effusions cannot be excluded. No pneumothorax. Degenerative change thoracic spine. Surgical clips right chest. IMPRESSION: 1. Right dual-lumen catheter in stable position. 2. Stable cardiomegaly. 3. Progressive bilateral pulmonary infiltrates/edema. Tiny bilateral pleural effusions cannot be excluded. Electronically Signed   By: Marcello Moores  Register   On: 07/15/2020 05:24   VAS US CAROTID  Summary: Right Carotid: Velocities in the right ICA are consistent with a 1-39% stenosis. Left Carotid: Velocities in the left ICA are consistent with a 1-39% stenosis. Vertebrals: Bilateral vertebral arteries were not visualized. *See table(s) above for measurements and observations.  Electronically signed by Antony Contras MD on 07/14/2020 at 1:04:27 PM.    Final     Scheduled Meds: . amiodarone  200 mg Oral Daily  . aspirin EC  81 mg Oral Daily  . atorvastatin  20 mg Oral QHS  . chlorhexidine  15 mL Mouth Rinse BID  . Chlorhexidine Gluconate Cloth  6 each Topical Daily  . Chlorhexidine Gluconate Cloth  6 each Topical Q0600  . diltiazem  120 mg Oral Daily  . hydrALAZINE  50 mg Oral Q8H  . insulin aspart  0-6 Units Subcutaneous Q4H  . mouth rinse  15 mL Mouth Rinse q12n4p  . mupirocin ointment  1 application Nasal BID  . pantoprazole  40 mg Oral BID   Continuous Infusions: . sodium chloride 10 mL/hr at 07/13/20 1900   PRN Meds:.sodium chloride, docusate sodium, guaiFENesin-dextromethorphan, hydrALAZINE, metoprolol tartrate, ondansetron (ZOFRAN) IV, polyethylene glycol, sodium chloride flush  ASSESMENT:   *  CGE, dark stools PTA.  resolved  *    COVID-19.  DX 12/13, monoclonal antibodies 12/17.  Bilateral pulmonary infiltrates/edema  *   Normocytic anemia.  Anemia of CKD at baseline.    *    Thrombocytopenia  *   ESRD.  MWF HD.    *   Afib, new.   On Eliquis, po Amiodarone.  Currently in  NSR.    *   CVA.   Echo cardiogram 12/21: LVEF 60 to 65%.  Grade 1 DD.  Pericardial effusion. Mild mitral valve regurgitation and annular calcification.  Tricuspid aortic valve with mild sclerosis, no stenosis   PLAN   *   Due to scheduling contraints, EGD set for this PM is rescheduled for 1330 tomorrow.    Can eat a carb modified renal solid diet today.  Clear liquids for tomorrow's breakfast and n.p.o. after 10 AM tomorrow    Azucena Freed  07/16/2020, 12:49 PM Phone [phone]

## 2020-07-16 NOTE — Progress Notes (Signed)
PROGRESS NOTE                                                                                                                                                                                                             Patient Demographics:    [name], is a 79 y.o. female, DOB - [birth year], OMB:[id number]  Outpatient Primary MD for the patient is [name], MD    LOS - 4  Admit date - 07/12/2020    Chief Complaint  Patient presents with  . Code Stroke  . GI Bleeding       Brief Narrative (HPI from H&P) - this is a 79 year old female with history of hypertension, lateral hip fractures, right-sided breast cancer s/p treatment including lumpectomy about 5 years ago, ESRD, dyslipidemia, DM type II, A. fib who was brought in the hospital on 07/12/2020 with altered mental status, was found to have ischemic stroke with hemorrhagic conversion, her stay was complicated by GI bleed and COVID-19 infection.  She was initially admitted under PCCM and was transferred to hospitalist service on 07/15/2020.  She has been seen so far by nephrology, neurology and PCCM.   Subjective:   Patient in bed, appears comfortable, denies any headache, no fever, no chest pain or pressure, no shortness of breath , no abdominal pain. No focal weakness, minimal grip issues in the right hand off and on for a few days according to patient   Assessment  & Plan :     1. Acute right basal ganglia hemorrhagic stroke- with minimal left-sided weakness, this appears to be ischemic infarct with hemorrhagic conversion due to A. Fib.  Seen by stroke team, case discussed with Dr. Leonie Man on 07/15/2020.  For now baby aspirin along with home dose statin.  LDL was within acceptable limits, echocardiogram nonacute, A1c slightly elevated will defer for PCP for better glycemic control.  Continue PT-OT and speech therapy.  Resume anticoagulation when stable from GI  standpoint.  Repeat noncontrast head CT 07/16/2020 as she has some grip issues in the right hand off and on, discussed with Dr. Leonie Man no new recommendations.  2.  A. fib RVR.  Likely found this admission.  She is currently in and out of A. fib and RVR, have started her on Cardizem, amiodarone added by cardiology, pending TSH, echocardiogram noted with preserved EF,  currently no anticoagulation due to recent bleed, discussed the case with neurology, audiology following, per neurology if EGD shows no signs of bleeding and H&H stable can start Eliquis in a few days after stable EGD.  3. Acute GI bleed with blood loss related anemia.  Appears to be upper GI.  S/p packed RBC transfusion on 07/12/2020 1 unit, monitor H&H, PPI, GI on board. EGD likely 07/17/20.  4.  ESRD.  On MWF schedule.  Case discussed with Dr. Hollie Salk nephrologist, might get an extra done on 07/15/2020  5.  Dyslipidemia.  On statin.  6.  Essential hypertension.  Diltiazem added hydralazine for better control.  7.  Mild metabolic encephalopathy.  Worsening due to recent stroke.  Supportive care.  8.  COVID-19 infection diagnosed on 07/07/2020.  S/p antibody infusion.  Monitor.    9.  History of right-sided breast cancer.  Outpatient follow-up with oncology doses 9-year-old problem.    10.  Hip pain.  Chronic.  CT scan stable.  Monitor with supportive care.  PT OT.    11. DM type II.  On sliding scale monitor and adjust.  Lab Results  Component Value Date   HGBA1C 7.9 (H) 07/13/2020    CBG (last 3)  Recent Labs    07/15/20 2350 07/16/20 0414 07/16/20 0727  GLUCAP 87 87 93    Lab Results  Component Value Date   CHOL 99 07/14/2020   HDL 31 (L) 07/14/2020   LDLCALC 32 07/14/2020   TRIG 178 (H) 07/14/2020   CHOLHDL 3.2 07/14/2020    Lab Results  Component Value Date   HGBA1C 7.9 (H) 07/13/2020     No results found for: TSH      Condition - Extremely Guarded  Family Communication  :   Husband Mallie Mussel  [phone] on 07/15/2020 message left at 11:36 AM.    Fredric Dine [phone] on 07/15/2020,  07/16/20  Code Status :  Full  Consults  : Neurology, PCCM, nephrology, cardiology  Procedures  :    CT HEAD  07/12/2020  -   Acute 7 mm hemorrhage within the right basal ganglia. No substantial mass effect. Chronic microvascular ischemic disease and remote right cerebellar lacunar infarct.   CT HEAD WO CONTRAST - 07/13/2020 expected evolutionary changes in the acute right basal ganglia hemorrhage overall similar in size with slightly decreased density compared to previous CT.  No new abnormality.  MRI / MRA Head - unchanged 1 cm right posterior lentiform nucleus/internal capsule hemorrhage without significant surrounding edema or mass-effect.  4 mm diffusion abnormality involving subcortical white matter in the right parietal corona radiata consistent with acute small vessel type infarct.    MRA of the brain technically limited due to motion artifact but no large vessel occlusion.  Focal high-grade stenosis of the right distal M1 segment and distal left M1 segment bifurcation.  Severe multifocal bilateral P2 and distal left P3 stenosis.  Transthoracic Echocardiogram - left ventricular ejection fraction 60 to 65%.  00/00/2021  Bilateral Carotid Dopplers - bilateral 1-39% stenosis of carotids.    PUD Prophylaxis : PPI  Disposition Plan  :    Status is: Inpatient  Remains inpatient appropriate because:IV treatments appropriate due to intensity of illness or inability to take PO   Dispo: The patient is from: Home              Anticipated d/c is to: SNF              Anticipated d/c date is: >  3 days              Patient currently is not medically stable to d/c.   DVT Prophylaxis  :   SCDs    Lab Results  Component Value Date   PLT 113 (L) 07/16/2020    Diet :  Diet Order            DIET SOFT Room service appropriate? Yes; Fluid consistency: Thin  Diet effective now                   Inpatient Medications  Scheduled Meds: . amiodarone  200 mg Oral Daily  . aspirin EC  81 mg Oral Daily  . atorvastatin  20 mg Oral QHS  . chlorhexidine  15 mL Mouth Rinse BID  . Chlorhexidine Gluconate Cloth  6 each Topical Daily  . Chlorhexidine Gluconate Cloth  6 each Topical Q0600  . diltiazem  120 mg Oral Daily  . hydrALAZINE  50 mg Oral Q8H  . insulin aspart  0-6 Units Subcutaneous Q4H  . mouth rinse  15 mL Mouth Rinse q12n4p  . mupirocin ointment  1 application Nasal BID  . pantoprazole  40 mg Oral BID   Continuous Infusions: . sodium chloride 10 mL/hr at 07/13/20 1900   PRN Meds:.sodium chloride, docusate sodium, fentaNYL (SUBLIMAZE) injection, guaiFENesin-dextromethorphan, hydrALAZINE, metoprolol tartrate, ondansetron (ZOFRAN) IV, polyethylene glycol, sodium chloride flush  Antibiotics  :    Anti-infectives (From admission, onward)   None       Time Spent in minutes  30   Lala Lund M.D on 07/16/2020 at 9:55 AM  To page go to [email or web address]   [facility] -  Office  [phone]   See all Orders from today for further details    Objective:   Vitals:   07/15/20 2351 07/16/20 0000 07/16/20 0414 07/16/20 0728  BP: (!) 149/69 137/66 (!) 143/69 (!) 151/71  Pulse: 75 71 73 74  Resp: 20 18 20 20   Temp: 98.9 F (37.2 C) 98.7 F (37.1 C) 98.8 F (37.1 C) 98.3 F (36.8 C)  TempSrc: Oral Axillary Axillary Oral  SpO2: 94% 95% 94% 99%  Weight:   76 kg   Height:        Wt Readings from Last 3 Encounters:  07/16/20 76 kg  04/21/20 77.9 kg  04/20/19 90.2 kg     Intake/Output Summary (Last 24 hours) at 07/16/2020 0955 Last data filed at 07/15/2020 1745 Gross per 24 hour  Intake --  Output 2600 ml  Net -2600 ml     Physical Exam  Awake mildly confused, No new F.N deficits, lack of blinking in the left eye, [place].AT,PERRAL Supple Neck,No JVD, No cervical lymphadenopathy appriciated.  Symmetrical Chest wall movement,  Good air movement bilaterally, CTAB RRR,No Gallops, Rubs or new Murmurs, No Parasternal Heave +ve B.Sounds, Abd Soft, No tenderness, No organomegaly appriciated, No rebound - guarding or rigidity. No Cyanosis, Clubbing or edema, No new Rash or bruise      Data Review:    CBC Recent Labs  Lab 07/12/20 0947 07/12/20 0954 07/13/20 0412 07/14/20 0232 07/15/20 0251 07/16/20 0146  WBC 5.4  --  5.3 7.9 6.0 4.6  HGB 10.8* 10.5* 11.4* 12.2 11.7* 10.7*  HCT 35.4* 31.0* 34.8* 38.4 35.9* 32.6*  PLT 123*  --  92* 112* 104* 113*  MCV 95.2  --  89.5 90.8 90.0 91.6  MCH 29.0  --  29.3 28.8 29.3 30.1  MCHC 30.5  --  32.8 31.8 32.6 32.8  RDW 18.2*  --  17.6* 17.9* 17.5* 17.6*  LYMPHSABS 0.4*  --   --   --   --  0.8  MONOABS 0.3  --   --   --   --  0.7  EOSABS 0.0  --   --   --   --  0.0  BASOSABS 0.1  --   --   --   --  0.0    Recent Labs  Lab 07/12/20 0947 07/12/20 0954 07/12/20 2308 07/13/20 0412 07/14/20 0232 07/15/20 0251 07/16/20 0146  NA 137   < > 139 137 141 138 138  K 3.9   < > 3.9 3.8 4.3 3.9 3.0*  CL 99   < > 105 105 105 101 100  CO2 20*  --  19* 17* 16* 21* 24  GLUCOSE 154*   < > 143* 155* 122* 89 86  BUN 72*   < > 81* 84* 90* 41* 24*  CREATININE 5.34*   < > 5.59* 5.55* 6.73* 4.79* 4.12*  CALCIUM 8.1*  --  7.7* 7.6* 8.0* 7.7* 7.5*  AST 51*  --   --   --   --  75* 50*  ALT 25  --   --   --   --  24 22  ALKPHOS 51  --   --   --   --  51 49  BILITOT 0.6  --   --   --   --  0.8 0.9  ALBUMIN 2.7*  --   --   --  2.4* 2.2* 2.5*  MG  --   --  1.6*  --  2.2 1.9 1.9  CRP  --   --   --   --   --   --  12.5*  DDIMER  --   --   --   --   --   --  1.54*  INR 1.1  --   --   --   --   --   --   HGBA1C  --   --   --  7.9*  --   --   --   BNP  --   --   --   --   --   --  325.5*   < > = values in this interval not displayed.    ------------------------------------------------------------------------------------------------------------------ Recent Labs    07/14/20 0232   CHOL 99  HDL 31*  LDLCALC 32  TRIG 178*  CHOLHDL 3.2    Lab Results  Component Value Date   HGBA1C 7.9 (H) 07/13/2020   ------------------------------------------------------------------------------------------------------------------ No results for input(s): TSH, T4TOTAL, T3FREE, THYROIDAB in the last 72 hours.  Invalid input(s): FREET3  Cardiac Enzymes No results for input(s): CKMB, TROPONINI, MYOGLOBIN in the last 168 hours.  Invalid input(s): CK ------------------------------------------------------------------------------------------------------------------    Component Value Date/Time   BNP 325.5 (H) 07/16/2020 0146    Micro Results Recent Results (from the past 240 hour(s))  MRSA PCR Screening     Status: Abnormal   Collection Time: 07/12/20  6:37 PM   Specimen: Nasal Mucosa; Nasopharyngeal  Result Value Ref Range Status   MRSA by PCR POSITIVE (A) NEGATIVE Final    Comment:        The GeneXpert MRSA Assay (FDA approved for NASAL specimens only), is one component of a comprehensive MRSA colonization surveillance program. It is not intended to diagnose MRSA infection nor to guide or monitor treatment for MRSA  infections. RESULT CALLED TO, READ BACK BY AND VERIFIED WITH: J. Dominica Severin 2139 07/12/2020 Mena Goes Performed at [facility], [place] [address] [place], [place] [address]     Radiology Reports DG Chest 1 View  Result Date: 07/12/2020 CLINICAL DATA:  Patient with history of COVID-19. History of GI bleed. EXAM: CHEST  1 VIEW COMPARISON:  Chest radiograph 07/10/2020. FINDINGS: Stable right-sided dialysis catheter. Monitoring leads overlie the patient. Stable cardiomegaly. Aortic atherosclerosis. Site oval increased patchy opacities left mid lower lung. No definite pleural effusion or pneumothorax. Thoracic spine degenerative changes. Surgical clips right axilla. IMPRESSION: Increasing patchy opacities left mid and lower lung may represent infection  in the appropriate clinical setting or potentially atelectasis. Electronically Signed   By: Lovey Newcomer M.D.   On: 07/12/2020 11:24   CT HEAD WO CONTRAST  Result Date: 07/13/2020 CLINICAL DATA:  Follow-up examination of basal ganglia hemorrhage. EXAM: CT HEAD WITHOUT CONTRAST TECHNIQUE: Contiguous axial images were obtained from the base of the skull through the vertex without intravenous contrast. COMPARISON:  Prior CT from 07/12/2020 FINDINGS: Brain: Previously identified intraparenchymal hemorrhage at the posterior right lentiform nucleus again seen. Bleed has mildly dispersed and is less dense in appearance as compared to previous exam, measuring overall similar in size at 10 x 7 x 10 mm, previously 10 x 5 x 10 mm when measured in a similar fashion. No significant surrounding edema or regional mass effect. No other acute or interval intracranial hemorrhage. No other acute large vessel territory infarct. No mass lesion, mass effect, or midline shift. No hydrocephalus or extra-axial fluid collection. Atrophy with chronic microvascular ischemic disease again noted. Vascular: No hyperdense vessel. Calcified atherosclerosis at the skull base. Skull: Scalp soft tissues and calvarium within normal limits. Sinuses/Orbits: Globes and orbital soft tissues demonstrate no acute finding. Mild mucoperiosteal thickening noted throughout the visualized paranasal sinuses. Mastoid air cells remain clear. Other: None. IMPRESSION: 1. Normal expected interval evolution of acute intraparenchymal hemorrhage positioned at the posterior right lentiform nucleus, overall similar in size but with slightly decreased density as compared to previous. No significant surrounding edema or regional mass effect. 2. No other new acute intracranial abnormality. 3. Atrophy with chronic microvascular ischemic disease. Electronically Signed   By: Jeannine Boga M.D.   On: 07/13/2020 18:45   CT PELVIS WO CONTRAST  Result Date:  07/14/2020 CLINICAL DATA:  Right hip pain EXAM: CT PELVIS WITHOUT CONTRAST TECHNIQUE: Multidetector CT imaging of the pelvis was performed following the standard protocol without intravenous contrast. COMPARISON:  Radiographs 07/12/2020 FINDINGS: Urinary Tract: Bladder and distal ureters obscured by streak artifact from the patient's bilateral hip hardware. Bowel:  Unremarkable, rectum partially obscured by streak artifact. Vascular/Lymphatic: Aortoiliac atherosclerotic vascular disease. No pathologic adenopathy identified. Reproductive:  Unremarkable Other:  No supplemental non-categorized findings. Musculoskeletal: Right hip screw with IM nail. Single distal interlocking screw along the IM nail. Inter trochanteric deformity from old fracture, no acute fracture is identified. Severe degenerative right hip arthropathy with loss of articular space and spurring. Left hip hemiarthroplasty without discrete complicating feature identified. Mild spurring along the SI joints. There is lower lumbar spondylosis and degenerative disc disease leading to moderate left foraminal stenosis at L5-S1. IMPRESSION: 1. Severe degenerative right hip arthropathy. 2. Right hip screw with IM nail. Right intertrochanteric deformity from old fracture. No acute fracture is identified. 3. Left hip hemiarthroplasty without discrete complicating feature identified. 4. Lower lumbar spondylosis and degenerative disc disease leading to moderate left foraminal stenosis at L5-S1. 5.  Aortic atherosclerosis. Aortic Atherosclerosis (ICD10-I70.0). Electronically Signed   By: Van Clines M.D.   On: 07/14/2020 14:12   MR ANGIO HEAD WO CONTRAST  Result Date: 07/13/2020 CLINICAL DATA:  Follow-up examination for acute stroke. EXAM: MRI HEAD WITHOUT CONTRAST MRA HEAD WITHOUT CONTRAST TECHNIQUE: Multiplanar, multiecho pulse sequences of the brain and surrounding structures were obtained without intravenous contrast. Angiographic images of the head  were obtained using MRA technique without contrast. COMPARISON:  Prior head CT from 07/12/2020 as well as previous brain MRI from 08/17/2016. FINDINGS: MRI HEAD FINDINGS Brain: Diffuse prominence of the CSF containing spaces compatible with generalized age-related cerebral atrophy. Patchy and confluent T2/FLAIR hyperintensity within the periventricular and deep white matter both cerebral hemispheres most likely related chronic microvascular ischemic disease. Patchy involvement of the pons. Overall, these changes are mildly progressed as compared to 2018. Few small remote lacunar type infarcts noted about the basal ganglia, most notable which present at the right caudothalamic groove. Previously identified hemorrhage centered at the posterior right lentiform nucleus/right internal capsule again seen, stable in size measuring up to approximately 1 cm in greatest dimension. This is somewhat difficult to visualized by MR, and is most notable on T1 weighted sequence (series 16, image 26). No significant surrounding edema or regional mass effect. No other acute intracranial hemorrhage. Few scattered chronic micro hemorrhages noted at the right cerebellum and pons, likely small vessel/hypertensive in nature. Single punctate 4 mm focus of diffusion abnormality involving the subcortical white matter of the right parietal corona radiata noted, likely a small acute to subacute small vessel type ischemic infarct (series 5, image 74). No associated hemorrhage or mass effect. No other diffusion abnormality to suggest acute or subacute ischemia. Gray-white matter differentiation otherwise maintained. No mass lesion, mass effect, or midline shift. No hydrocephalus or extra-axial fluid collection. Pituitary gland suprasellar region normal. Midline structures intact. 1.3 cm ovoid well-circumscribed lesion positioned at the left petrous apex demonstrating heterogeneous T2/FLAIR signal intensity with hyperintense T1 signal intensity  noted (series 16, image 14), nonspecific, but could reflect a small cholesterol granuloma versus asymmetric fatty marrow. Finding is relatively stable from previous MRI from 2018, and of doubtful significance. Vascular: Major intracranial vascular flow voids are maintained. Skull and upper cervical spine: Craniocervical junction within normal limits. Multilevel degenerative spondylosis noted within the visualized upper cervical spine with resultant mild-to-moderate spinal stenosis. Bone marrow signal intensity within normal limits. No scalp soft tissue abnormality. Sinuses/Orbits: Patient status post bilateral ocular lens replacement. Globes and orbital soft tissues demonstrate no acute finding. Mild scattered mucosal thickening noted throughout the paranasal sinuses. Trace bilateral mastoid effusions, of doubtful significance. Visualized nasopharynx within normal limits. Other: None. MRA HEAD FINDINGS ANTERIOR CIRCULATION: Examination degraded by motion artifact. Visualized distal cervical segments of the internal carotid arteries are patent with symmetric antegrade flow. Petrous segments widely patent. Atheromatous irregularity throughout the carotid siphons with associated mild multifocal narrowing. A1 segments patent bilaterally. Normal anterior communicating artery complex. Anterior cerebral arteries patent to their distal aspects without high-grade stenosis. Short-segment severe stenosis at the distal left M1 segment/left MCA bifurcation (series 1036, image 12). Left MCA branches are diffusely irregular but perfused distally. Right M1 segment patent proximally. Focal signal cutoff at the mid-distal right M1 segment into the bifurcation (series 1036, image 11). This likely reflects a severe high-grade stenosis, as the right MCA branches are perfused distally. Diffuse atheromatous irregularity seen throughout the right MCA branches as well. Proximally. POSTERIOR CIRCULATION: Vertebral arteries largely code  dominant and patent to  the vertebrobasilar junction. Both PICA origins patent and normal. Basilar patent to its distal aspect without stenosis. Superior cerebral arteries patent bilaterally. Both PCAs primarily supplied via the basilar. Severe long segment stenosis involving the mid-distal right P2 segment (series 1046, image 15). Severe multifocal distal left P2 and P3 stenoses present as well (series 1046, image 14). PCAs remain perfused to their distal aspects. No intracranial aneurysm or other vascular abnormality. IMPRESSION: MRI HEAD IMPRESSION: 1. Unchanged 1 cm hemorrhage centered at the posterior right lentiform nucleus/right internal capsule. No significant surrounding edema or regional mass effect. 2. 4 mm focus of diffusion abnormality involving the subcortical white matter of the right parietal corona radiata, consistent with a small acute to subacute small vessel type ischemic infarct. No associated hemorrhage. 3. Underlying age-related cerebral atrophy with moderate chronic microvascular ischemic disease, mildly progressed as compared to 2018. MRA HEAD IMPRESSION: 1. Technically limited exam due to motion artifact. 2. Negative intracranial MRA for large vessel occlusion. 3. Focal severe high-grade stenoses involving the mid-distal right M1 segment and distal left M1 segment/left MCA bifurcation. Left MCA branches remain perfused distally. 4. Severe multifocal bilateral P2 and distal left P3 stenoses as above. Electronically Signed   By: Jeannine Boga M.D.   On: 07/13/2020 19:16   MR BRAIN WO CONTRAST  Result Date: 07/13/2020 CLINICAL DATA:  Follow-up examination for acute stroke. EXAM: MRI HEAD WITHOUT CONTRAST MRA HEAD WITHOUT CONTRAST TECHNIQUE: Multiplanar, multiecho pulse sequences of the brain and surrounding structures were obtained without intravenous contrast. Angiographic images of the head were obtained using MRA technique without contrast. COMPARISON:  Prior head CT from  07/12/2020 as well as previous brain MRI from 08/17/2016. FINDINGS: MRI HEAD FINDINGS Brain: Diffuse prominence of the CSF containing spaces compatible with generalized age-related cerebral atrophy. Patchy and confluent T2/FLAIR hyperintensity within the periventricular and deep white matter both cerebral hemispheres most likely related chronic microvascular ischemic disease. Patchy involvement of the pons. Overall, these changes are mildly progressed as compared to 2018. Few small remote lacunar type infarcts noted about the basal ganglia, most notable which present at the right caudothalamic groove. Previously identified hemorrhage centered at the posterior right lentiform nucleus/right internal capsule again seen, stable in size measuring up to approximately 1 cm in greatest dimension. This is somewhat difficult to visualized by MR, and is most notable on T1 weighted sequence (series 16, image 26). No significant surrounding edema or regional mass effect. No other acute intracranial hemorrhage. Few scattered chronic micro hemorrhages noted at the right cerebellum and pons, likely small vessel/hypertensive in nature. Single punctate 4 mm focus of diffusion abnormality involving the subcortical white matter of the right parietal corona radiata noted, likely a small acute to subacute small vessel type ischemic infarct (series 5, image 74). No associated hemorrhage or mass effect. No other diffusion abnormality to suggest acute or subacute ischemia. Gray-white matter differentiation otherwise maintained. No mass lesion, mass effect, or midline shift. No hydrocephalus or extra-axial fluid collection. Pituitary gland suprasellar region normal. Midline structures intact. 1.3 cm ovoid well-circumscribed lesion positioned at the left petrous apex demonstrating heterogeneous T2/FLAIR signal intensity with hyperintense T1 signal intensity noted (series 16, image 14), nonspecific, but could reflect a small cholesterol  granuloma versus asymmetric fatty marrow. Finding is relatively stable from previous MRI from 2018, and of doubtful significance. Vascular: Major intracranial vascular flow voids are maintained. Skull and upper cervical spine: Craniocervical junction within normal limits. Multilevel degenerative spondylosis noted within the visualized upper cervical spine with resultant mild-to-moderate  spinal stenosis. Bone marrow signal intensity within normal limits. No scalp soft tissue abnormality. Sinuses/Orbits: Patient status post bilateral ocular lens replacement. Globes and orbital soft tissues demonstrate no acute finding. Mild scattered mucosal thickening noted throughout the paranasal sinuses. Trace bilateral mastoid effusions, of doubtful significance. Visualized nasopharynx within normal limits. Other: None. MRA HEAD FINDINGS ANTERIOR CIRCULATION: Examination degraded by motion artifact. Visualized distal cervical segments of the internal carotid arteries are patent with symmetric antegrade flow. Petrous segments widely patent. Atheromatous irregularity throughout the carotid siphons with associated mild multifocal narrowing. A1 segments patent bilaterally. Normal anterior communicating artery complex. Anterior cerebral arteries patent to their distal aspects without high-grade stenosis. Short-segment severe stenosis at the distal left M1 segment/left MCA bifurcation (series 1036, image 12). Left MCA branches are diffusely irregular but perfused distally. Right M1 segment patent proximally. Focal signal cutoff at the mid-distal right M1 segment into the bifurcation (series 1036, image 11). This likely reflects a severe high-grade stenosis, as the right MCA branches are perfused distally. Diffuse atheromatous irregularity seen throughout the right MCA branches as well. Proximally. POSTERIOR CIRCULATION: Vertebral arteries largely code dominant and patent to the vertebrobasilar junction. Both PICA origins patent and  normal. Basilar patent to its distal aspect without stenosis. Superior cerebral arteries patent bilaterally. Both PCAs primarily supplied via the basilar. Severe long segment stenosis involving the mid-distal right P2 segment (series 1046, image 15). Severe multifocal distal left P2 and P3 stenoses present as well (series 1046, image 14). PCAs remain perfused to their distal aspects. No intracranial aneurysm or other vascular abnormality. IMPRESSION: MRI HEAD IMPRESSION: 1. Unchanged 1 cm hemorrhage centered at the posterior right lentiform nucleus/right internal capsule. No significant surrounding edema or regional mass effect. 2. 4 mm focus of diffusion abnormality involving the subcortical white matter of the right parietal corona radiata, consistent with a small acute to subacute small vessel type ischemic infarct. No associated hemorrhage. 3. Underlying age-related cerebral atrophy with moderate chronic microvascular ischemic disease, mildly progressed as compared to 2018. MRA HEAD IMPRESSION: 1. Technically limited exam due to motion artifact. 2. Negative intracranial MRA for large vessel occlusion. 3. Focal severe high-grade stenoses involving the mid-distal right M1 segment and distal left M1 segment/left MCA bifurcation. Left MCA branches remain perfused distally. 4. Severe multifocal bilateral P2 and distal left P3 stenoses as above. Electronically Signed   By: Jeannine Boga M.D.   On: 07/13/2020 19:16   DG Pelvis Portable  Result Date: 07/12/2020 CLINICAL DATA:  Right hip pain.  No reported injury. EXAM: PORTABLE PELVIS 1-2 VIEWS COMPARISON:  07/21/2018 outside pelvic radiographs FINDINGS: Partially visualized left hip hemiarthroplasty and partially visualized fixation hardware in the proximal right femur with no evidence of hardware fracture or loosening. No evidence of hip dislocation on this single frontal view. No osseous fracture. No focal osseous lesions. Moderate right hip  osteoarthritis. No suspicious focal osseous lesions. No pelvic diastasis. IMPRESSION: No acute osseous abnormality. No evidence of hip dislocation on this single frontal view. Moderate right hip osteoarthritis. Partially visualized left hip hemiarthroplasty and proximal right femur fixation hardware, with no evidence of hardware complication. Electronically Signed   By: Ilona Sorrel M.D.   On: 07/12/2020 12:33   DG CHEST PORT 1 VIEW  Result Date: 07/15/2020 CLINICAL DATA:  COVID-19. EXAM: PORTABLE CHEST 1 VIEW COMPARISON:  07/12/2020. FINDINGS: Right dual-lumen catheter stable position. Stable cardiomegaly. Progressive bilateral pulmonary infiltrates/edema. Tiny bilateral pleural effusions cannot be excluded. No pneumothorax. Degenerative change thoracic spine. Surgical clips right chest.  IMPRESSION: 1. Right dual-lumen catheter in stable position. 2. Stable cardiomegaly. 3. Progressive bilateral pulmonary infiltrates/edema. Tiny bilateral pleural effusions cannot be excluded. Electronically Signed   By: Marcello Moores  Register   On: 07/15/2020 05:24   DG Abd Portable 1V  Result Date: 07/13/2020 CLINICAL DATA:  Evaluate orogastric tube EXAM: PORTABLE ABDOMEN - 1 VIEW COMPARISON:  July 13, 2020 FINDINGS: The OG tube side port and distal tip are in the region the stomach. IMPRESSION: The OG tube is in good position. Electronically Signed   By: Dorise Bullion III M.D   On: 07/13/2020 11:04   DG Abd Portable 1V  Result Date: 07/13/2020 CLINICAL DATA:  Vomiting. EXAM: PORTABLE ABDOMEN - 1 VIEW COMPARISON:  None. FINDINGS: No evidence of dilated bowel loops. Aortic atherosclerotic calcification noted. Compression screw noted in the right hip. Left hip prosthesis is also seen. IMPRESSION: Unremarkable bowel gas pattern.  No acute findings. Electronically Signed   By: Marlaine Hind M.D.   On: 07/13/2020 05:08   ECHOCARDIOGRAM COMPLETE  Result Date: 07/14/2020    ECHOCARDIOGRAM REPORT   Patient Name:   ROIZY HAROLD Date of Exam: 07/14/2020 Medical Rec #:  [id number]     Height:       64.0 in Accession #:    [id number]    Weight:       174.6 lb Date of Birth:  03-Oct-1940      BSA:          1.847 m Patient Age:    71 years      BP:           114/75 mmHg Patient Gender: F             HR:           77 bpm. Exam Location:  Inpatient Procedure: 2D Echo, Color Doppler and Cardiac Doppler Indications:    Stroke 434.91 / I163.9  History:        Patient has no prior history of Echocardiogram examinations.                 Risk Factors:Dyslipidemia and Diabetes.  Sonographer:    Bernadene Person RDCS Referring Phys: [place]  1. Left ventricular ejection fraction, by estimation, is 60 to 65%. The left ventricle has normal function. The left ventricle has no regional wall motion abnormalities. There is moderate left ventricular hypertrophy. Left ventricular diastolic parameters are consistent with Grade I diastolic dysfunction (impaired relaxation). Elevated left ventricular end-diastolic pressure. The E/e' is 31.  2. Right ventricular systolic function is normal. The right ventricular size is normal.  3. The pericardial effusion is posterior to the left ventricle.  4. The mitral valve is abnormal. Mild mitral valve regurgitation. Moderate mitral annular calcification.  5. The aortic valve is tricuspid. Aortic valve regurgitation is not visualized. Mild aortic valve sclerosis is present, with no evidence of aortic valve stenosis. Comparison(s): No prior Echocardiogram. FINDINGS  Left Ventricle: Left ventricular ejection fraction, by estimation, is 60 to 65%. The left ventricle has normal function. The left ventricle has no regional wall motion abnormalities. The left ventricular internal cavity size was normal in size. There is  moderate left ventricular hypertrophy. Left ventricular diastolic parameters are consistent with Grade I diastolic dysfunction (impaired relaxation). Elevated left ventricular  end-diastolic pressure. The E/e' is 20. Right Ventricle: The right ventricular size is normal. No increase in right ventricular wall thickness. Right ventricular systolic function is normal. Left Atrium: Left atrial  size was normal in size. Right Atrium: Right atrial size was normal in size. Pericardium: Trivial pericardial effusion is present. The pericardial effusion is posterior to the left ventricle. Mitral Valve: The mitral valve is abnormal. There is mild thickening of the mitral valve leaflet(s). Moderate mitral annular calcification. Mild mitral valve regurgitation, with posteriorly-directed jet. Tricuspid Valve: The tricuspid valve is grossly normal. Tricuspid valve regurgitation is trivial. Aortic Valve: The aortic valve is tricuspid. Aortic valve regurgitation is not visualized. Mild aortic valve sclerosis is present, with no evidence of aortic valve stenosis. Pulmonic Valve: The pulmonic valve was normal in structure. Pulmonic valve regurgitation is trivial. Aorta: The aortic root and ascending aorta are structurally normal, with no evidence of dilitation. Venous: The inferior vena cava was not well visualized. IAS/Shunts: No atrial level shunt detected by color flow Doppler.  LEFT VENTRICLE PLAX 2D LVIDd:         4.30 cm  Diastology LVIDs:         2.80 cm  LV e' medial:    5.66 cm/s LV PW:         1.20 cm  LV E/e' medial:  21.9 LV IVS:        1.20 cm  LV e' lateral:   6.20 cm/s LVOT diam:     2.10 cm  LV E/e' lateral: 20.0 LV SV:         103 LV SV Index:   56 LVOT Area:     3.46 cm  RIGHT VENTRICLE RV S prime:     13.90 cm/s TAPSE (M-mode): 1.8 cm LEFT ATRIUM             Index       RIGHT ATRIUM           Index LA diam:        3.30 cm 1.79 cm/m  RA Area:     13.60 cm LA Vol (A2C):   51.0 ml 27.62 ml/m RA Volume:   28.50 ml  15.43 ml/m LA Vol (A4C):   50.1 ml 27.13 ml/m LA Biplane Vol: 51.9 ml 28.10 ml/m  AORTIC VALVE LVOT Vmax:   143.00 cm/s LVOT Vmean:  98.200 cm/s LVOT VTI:    0.296 m  AORTA Ao  Root diam: 3.00 cm Ao Asc diam:  3.40 cm MITRAL VALVE                TRICUSPID VALVE MV Area (PHT): 3.21 cm     TR Peak grad:   23.2 mmHg MV Decel Time: 236 msec     TR Vmax:        241.00 cm/s MV E velocity: 124.00 cm/s MV A velocity: 120.00 cm/s  SHUNTS MV E/A ratio:  1.03         Systemic VTI:  0.30 m                             Systemic Diam: 2.10 cm Lyman Bishop MD Electronically signed by Lyman Bishop MD Signature Date/Time: 07/14/2020/2:10:04 PM    Final    CT HEAD CODE STROKE WO CONTRAST  Addendum Date: 07/12/2020   ADDENDUM REPORT: 07/12/2020 10:16 ADDENDUM: Correction.  Findings were discussed with Dr. Theda Sers. Electronically Signed   By: Margaretha Sheffield MD   On: 07/12/2020 10:16   Result Date: 07/12/2020 CLINICAL DATA:  Code stroke.  Neuro deficit, acute stroke suspected. EXAM: CT HEAD WITHOUT CONTRAST TECHNIQUE: Contiguous axial images were obtained  from the base of the skull through the vertex without intravenous contrast. COMPARISON:  MRI head August 17, 2016 FINDINGS: Brain: Acute hemorrhage in the right basal ganglia, centered in the region of the posterior putamen/posterior limb of the internal capsule. The hemorrhage measures approximately 7 x 4 by 6 mm. No evidence of acute large vascular territory infarct. Patchy white matter hypoattenuation, which is nonspecific but most likely related to chronic microvascular ischemic disease. Mild generalized atrophy. No hydrocephalus. No evidence of a mass lesion. No midline shift. Remote right cerebellar lacunar infarct. Vascular: Calcific atherosclerosis. Skull: No acute fracture. Sinuses/Orbits: Sinuses are clear.  Unremarkable orbits. Other: No mastoid effusion. IMPRESSION: 1. Acute 7 mm hemorrhage within the right basal ganglia. No substantial mass effect. 2. Chronic microvascular ischemic disease and remote right cerebellar lacunar infarct. Code stroke imaging results were communicated on 07/12/2020 at 10:00 am to provider Dr. Lorrin Goodell  via telephone, who verbally acknowledged these results. Electronically Signed: By: Margaretha Sheffield MD On: 07/12/2020 10:05   VAS US CAROTID  Result Date: 07/14/2020 Carotid Arterial Duplex Study Indications:       CVA. Risk Factors:      Hyperlipidemia, Diabetes. Limitations        Today's exam was limited due to the patient's inability or                    unwillingness to cooperate. Comparison Study:  no prior Performing Technologist: Abram Sander RVS  Examination Guidelines: A complete evaluation includes B-mode imaging, spectral Doppler, color Doppler, and power Doppler as needed of all accessible portions of each vessel. Bilateral testing is considered an integral part of a complete examination. Limited examinations for reoccurring indications may be performed as noted.  Right Carotid Findings: +----------+--------+--------+--------+------------------+--------------+           PSV cm/sEDV cm/sStenosisPlaque DescriptionComments       +----------+--------+--------+--------+------------------+--------------+ CCA Prox  54      9               heterogenous                     +----------+--------+--------+--------+------------------+--------------+ CCA Distal65      16              heterogenous                     +----------+--------+--------+--------+------------------+--------------+ ICA Prox  48      17      1-39%   heterogenous                     +----------+--------+--------+--------+------------------+--------------+ ICA Distal                                          Not visualized +----------+--------+--------+--------+------------------+--------------+ ECA       91      9                                                +----------+--------+--------+--------+------------------+--------------+ +----------+--------+-------+--------------------------+-------------------+           PSV cm/sEDV cmsDescribe                  Arm Pressure (mmHG)  +----------+--------+-------+--------------------------+-------------------+ Subclavian  not visualized due to port                    +----------+--------+-------+--------------------------+-------------------+ +---------+--------+--------+--------------+ VertebralPSV cm/sEDV cm/sNot identified +---------+--------+--------+--------------+  Left Carotid Findings: +----------+--------+--------+--------+------------------+--------------+           PSV cm/sEDV cm/sStenosisPlaque DescriptionComments       +----------+--------+--------+--------+------------------+--------------+ CCA Prox  53      5               heterogenous                     +----------+--------+--------+--------+------------------+--------------+ CCA Distal73      10              heterogenous                     +----------+--------+--------+--------+------------------+--------------+ ICA Prox  127     16      1-39%   heterogenous                     +----------+--------+--------+--------+------------------+--------------+ ICA Distal                                          Not visualized +----------+--------+--------+--------+------------------+--------------+ ECA       220                                                      +----------+--------+--------+--------+------------------+--------------+ +----------+--------+--------+--------------+-------------------+           PSV cm/sEDV cm/sDescribe      Arm Pressure (mmHG) +----------+--------+--------+--------------+-------------------+ Subclavian                Not identified                    +----------+--------+--------+--------------+-------------------+ +---------+--------+--------+--------------+ VertebralPSV cm/sEDV cm/sNot identified +---------+--------+--------+--------------+   Summary: Right Carotid: Velocities in the right ICA are consistent with a 1-39% stenosis. Left Carotid: Velocities in the left ICA  are consistent with a 1-39% stenosis. Vertebrals: Bilateral vertebral arteries were not visualized. *See table(s) above for measurements and observations.  Electronically signed by Antony Contras MD on 07/14/2020 at 1:04:27 PM.    Final

## 2020-07-16 NOTE — Evaluation (Signed)
Clinical/Bedside Swallow Evaluation Patient Details  Name: [name] MRN: [id number] Date of Birth: [birth year]  Today's Date: 07/16/2020 Time: SLP Start Time (ACUTE ONLY): 54 SLP Stop Time (ACUTE ONLY): 1630 SLP Time Calculation (min) (ACUTE ONLY): 14 min  Past Medical History:  Past Medical History:  Diagnosis Date  . Cancer ([place])   . Diabetes mellitus   . Hyperlipidemia    Past Surgical History:  Past Surgical History:  Procedure Laterality Date  . BREAST LUMPECTOMY  03/26/2011   right   HPI:  Pt is a 79 y.o. female with initial COVID dx on 07/07/20 and underwent antibody infusion, admitted 07/12/20 as code stroke with L-side facial droop and flaccid paralysis. Workup revealed 7 mm hemorrhage in R basal ganglia; not a tPA or IR candidate. Pt also with new onset PAF, GIB and GI was consulted. Plan for EGD 12/23 with notes stating that she may have regular consistencies on 12/22. PMH includes CA, DM, ESRD (HD MWF).   Assessment / Plan / Recommendation Clinical Impression  Pt was seen for bedside swallow evaluation and she denied a history of dysphagia. Oral mechanism exam was [facility] and dentition was adequate. She tolerated all solids and liquids without signs or symptoms of oropharyngeal dysphagia. It is recommended that a regular texture diet with thin liquids be continued and further skilled SLP services are not clinically indicated for swallowing. SLP Visit Diagnosis: Dysphagia, unspecified (R13.10)    Aspiration Risk  Mild aspiration risk    Diet Recommendation Regular;Thin liquid   Liquid Administration via: Cup;Straw Medication Administration: Whole meds with liquid Supervision: Patient able to self feed    Other  Recommendations Oral Care Recommendations: Oral care BID   Follow up Recommendations  (Pending results of SLE)      Frequency and Duration            Prognosis Prognosis for Safe Diet Advancement: Good Barriers to Reach Goals: Cognitive deficits       Swallow Study   General Date of Onset: 07/15/20 HPI: Pt is a 79 y.o. female with initial COVID dx on 07/07/20 and underwent antibody infusion, admitted 07/12/20 as code stroke with L-side facial droop and flaccid paralysis. Workup revealed 7 mm hemorrhage in R basal ganglia; not a tPA or IR candidate. Pt also with new onset PAF, GIB and GI was consulted. Plan for EGD 12/23 with notes stating that she may have regular consistencies on 12/22. PMH includes CA, DM, ESRD (HD MWF). Type of Study: Bedside Swallow Evaluation Previous Swallow Assessment: None Diet Prior to this Study: Regular;Thin liquids Temperature Spikes Noted: No Respiratory Status: Nasal cannula History of Recent Intubation: No Behavior/Cognition: Alert;Cooperative;Pleasant mood Oral Cavity Assessment: Within Functional Limits Oral Care Completed by SLP: No Oral Cavity - Dentition: Adequate natural dentition Vision: Functional for self-feeding Self-Feeding Abilities: Needs assist Patient Positioning: Upright in chair;Postural control adequate for testing Baseline Vocal Quality: Normal Volitional Cough: Strong Volitional Swallow: Able to elicit    Oral/Motor/Sensory Function Overall Oral Motor/Sensory Function: Within functional limits   Ice Chips Ice chips: Within functional limits Presentation: Spoon   Thin Liquid Thin Liquid: Within functional limits Presentation: Straw    Nectar Thick Nectar Thick Liquid: Not tested   Honey Thick Honey Thick Liquid: Not tested   Puree Puree: Not tested   Solid     Solid: Within functional limits Presentation: Self Fed     [name] I. [name], [name], [name] Office number [phone] Pager [phone]  [name] 07/16/2020,5:37  PM        

## 2020-07-16 NOTE — Progress Notes (Signed)
Inpatient Rehabilitation Admissions Coordinator   Inpatient rehab prescreen requested per therapy recs.  Noted COVID + 07/12/20. Patients are eligible to be considered for admit to the [facility] when cleared from airborne precautions by acute MD or Infectious disease. Otherwise they will need to be >20 days from their positive test with recovery/improvement in symptoms ( 07/29/19) or 2 negative tests. Please call me with any questions. I will follow.   Danne Baxter, RN, MSN Rehab Admissions Coordinator [phone] 04/01/2020 12:39 PM

## 2020-07-16 NOTE — H&P (View-Only) (Signed)
Ok to eat   Azucena Freed PA-C.                                                               Daily Rounding Note  07/16/2020, 12:49 PM  LOS: 4 days   SUBJECTIVE:   Chief complaint:    CGE.  Black stools.  covid 19  Can not recall her last BM but none yest or today. Oxygen sats in mid to upper 90s on 6 L NCO2, now on 2.5 L NCO2 w sats of 92%. Fair po intake of solid food.  No abd pain, no nausea.      OBJECTIVE:         Vital signs in last 24 hours:    Temp:  [98.3 F (36.8 C)-98.9 F (37.2 C)] 98.9 F (37.2 C) (12/22 1208) Pulse Rate:  [62-87] 87 (12/22 1208) Resp:  [17-20] 20 (12/22 1208) BP: (102-151)/(54-99) 143/99 (12/22 1208) SpO2:  [92 %-99 %] 92 % (12/22 1208) Weight:  [76 kg-79.9 kg] 76 kg (12/22 0414) Last BM Date: 07/13/20 Filed Weights   07/15/20 1440 07/15/20 1745 07/16/20 0414  Weight: 79.9 kg 77.5 kg 76 kg   General: looks moderately unwell.  Alert, comfortable,  coughing   Heart: RRR.  NSR in 80s on monitor Chest: diminished BS.  Slightly mucoid cough.  No dyspnea Abdomen: soft, active BS, NT, ND  Extremities: Trace LE edema Neuro/Psych:  Oriented x 3.  Appropriate.     Intake/Output from previous day: 12/21 0701 - 12/22 0700 In: -  Out: 2600 [Urine:100]  Intake/Output this shift: No intake/output data recorded.  Lab Results: Recent Labs    07/14/20 0232 07/15/20 0251 07/16/20 0146  WBC 7.9 6.0 4.6  HGB 12.2 11.7* 10.7*  HCT 38.4 35.9* 32.6*  PLT 112* 104* 113*   BMET Recent Labs    07/14/20 0232 07/15/20 0251 07/16/20 0146  NA 141 138 138  K 4.3 3.9 3.0*  CL 105 101 100  CO2 16* 21* 24  GLUCOSE 122* 89 86  BUN 90* 41* 24*  CREATININE 6.73* 4.79* 4.12*  CALCIUM 8.0* 7.7* 7.5*   LFT Recent Labs    07/14/20 0232 07/15/20 0251 07/16/20 0146  PROT  --  5.3* 5.5*  ALBUMIN 2.4* 2.2* 2.5*  AST  --  75* 50*  ALT  --  24 22  ALKPHOS  --  51 49  BILITOT  --  0.8 0.9   PT/INR No results for input(s):  LABPROT, INR in the last 72 hours. Hepatitis Panel Recent Labs    07/14/20 0745  HEPBSAG NON REACTIVE    Studies/Results: CT PELVIS WO CONTRAST  Result Date: 07/14/2020 CLINICAL DATA:  Right hip pain EXAM: CT PELVIS WITHOUT CONTRAST TECHNIQUE: Multidetector CT imaging of the pelvis was performed following the standard protocol without intravenous contrast. COMPARISON:  Radiographs 07/12/2020 FINDINGS: Urinary Tract: Bladder and distal ureters obscured by streak artifact from the patient's bilateral hip hardware. Bowel:  Unremarkable, rectum partially obscured by streak artifact. Vascular/Lymphatic: Aortoiliac atherosclerotic vascular disease. No pathologic adenopathy identified. Reproductive:  Unremarkable Other:  No supplemental non-categorized findings. Musculoskeletal: Right hip screw with IM nail. Single distal interlocking screw along the IM nail. Inter trochanteric deformity from old fracture, no  acute fracture is identified. Severe degenerative right hip arthropathy with loss of articular space and spurring. Left hip hemiarthroplasty without discrete complicating feature identified. Mild spurring along the SI joints. There is lower lumbar spondylosis and degenerative disc disease leading to moderate left foraminal stenosis at L5-S1. IMPRESSION: 1. Severe degenerative right hip arthropathy. 2. Right hip screw with IM nail. Right intertrochanteric deformity from old fracture. No acute fracture is identified. 3. Left hip hemiarthroplasty without discrete complicating feature identified. 4. Lower lumbar spondylosis and degenerative disc disease leading to moderate left foraminal stenosis at L5-S1. 5. Aortic atherosclerosis. Aortic Atherosclerosis (ICD10-I70.0). Electronically Signed   By: Van Clines M.D.   On: 07/14/2020 14:12   DG CHEST PORT 1 VIEW  Result Date: 07/15/2020 CLINICAL DATA:  COVID-19. EXAM: PORTABLE CHEST 1 VIEW COMPARISON:  07/12/2020. FINDINGS: Right dual-lumen catheter  stable position. Stable cardiomegaly. Progressive bilateral pulmonary infiltrates/edema. Tiny bilateral pleural effusions cannot be excluded. No pneumothorax. Degenerative change thoracic spine. Surgical clips right chest. IMPRESSION: 1. Right dual-lumen catheter in stable position. 2. Stable cardiomegaly. 3. Progressive bilateral pulmonary infiltrates/edema. Tiny bilateral pleural effusions cannot be excluded. Electronically Signed   By: Marcello Moores  Register   On: 07/15/2020 05:24   VAS US CAROTID  Summary: Right Carotid: Velocities in the right ICA are consistent with a 1-39% stenosis. Left Carotid: Velocities in the left ICA are consistent with a 1-39% stenosis. Vertebrals: Bilateral vertebral arteries were not visualized. *See table(s) above for measurements and observations.  Electronically signed by Antony Contras MD on 07/14/2020 at 1:04:27 PM.    Final     Scheduled Meds: . amiodarone  200 mg Oral Daily  . aspirin EC  81 mg Oral Daily  . atorvastatin  20 mg Oral QHS  . chlorhexidine  15 mL Mouth Rinse BID  . Chlorhexidine Gluconate Cloth  6 each Topical Daily  . Chlorhexidine Gluconate Cloth  6 each Topical Q0600  . diltiazem  120 mg Oral Daily  . hydrALAZINE  50 mg Oral Q8H  . insulin aspart  0-6 Units Subcutaneous Q4H  . mouth rinse  15 mL Mouth Rinse q12n4p  . mupirocin ointment  1 application Nasal BID  . pantoprazole  40 mg Oral BID   Continuous Infusions: . sodium chloride 10 mL/hr at 07/13/20 1900   PRN Meds:.sodium chloride, docusate sodium, guaiFENesin-dextromethorphan, hydrALAZINE, metoprolol tartrate, ondansetron (ZOFRAN) IV, polyethylene glycol, sodium chloride flush  ASSESMENT:   *  CGE, dark stools PTA.  resolved  *    COVID-19.  DX 12/13, monoclonal antibodies 12/17.  Bilateral pulmonary infiltrates/edema  *   Normocytic anemia.  Anemia of CKD at baseline.    *    Thrombocytopenia  *   ESRD.  MWF HD.    *   Afib, new.   On Eliquis, po Amiodarone.  Currently in  NSR.    *   CVA.   Echo cardiogram 12/21: LVEF 60 to 65%.  Grade 1 DD.  Pericardial effusion. Mild mitral valve regurgitation and annular calcification.  Tricuspid aortic valve with mild sclerosis, no stenosis   PLAN   *   Due to scheduling contraints, EGD set for this PM is rescheduled for 1330 tomorrow.    Can eat a carb modified renal solid diet today.  Clear liquids for tomorrow's breakfast and n.p.o. after 10 AM tomorrow    Azucena Freed  07/16/2020, 12:49 PM Phone [phone]

## 2020-07-16 NOTE — Evaluation (Signed)
Speech Language Pathology Evaluation Patient Details Name: [name] MRN: [id number] DOB: April 08, 1941 Today's Date: 07/16/2020 Time: 3875-6433 SLP Time Calculation (min) (ACUTE ONLY): 19 min  Problem List:  Patient Active Problem List   Diagnosis Date Noted   ESRD on hemodialysis ([place])    Upper GI bleed    Pneumonia due to COVID-19 virus    Atrial fibrillation with RVR ([place])    Right-sided nontraumatic intracerebral hemorrhage ([facility])    CVA (cerebral vascular accident) ([place]) 07/12/2020   Fall 07/06/2018   Left temporal lobe infarction ([place]) 09/05/2017   TIA (transient ischemic attack) 09/04/2017   CKD (chronic kidney disease) stage 3, GFR 30-59 ml/min ([place]) 09/03/2017   Essential hypertension 09/03/2017   Type 2 diabetes mellitus with stage 3 chronic kidney disease ([place]) 09/03/2017   Lobular carcinoma of right breast ([place]) 03/01/2011   Past Medical History:  Past Medical History:  Diagnosis Date   Cancer ([place])    Diabetes mellitus    Hyperlipidemia    Past Surgical History:  Past Surgical History:  Procedure Laterality Date   BREAST LUMPECTOMY  03/26/2011   right   HPI:  Pt is a 79 y.o. female with initial COVID dx on 07/07/20 and underwent antibody infusion, admitted 07/12/20 as code stroke with L-side facial droop and flaccid paralysis. Workup revealed 7 mm hemorrhage in R basal ganglia; not a tPA or IR candidate. Pt also with new onset PAF, GIB and GI was consulted. Plan for EGD 12/23 with notes stating that she may have regular consistencies on 12/22. PMH includes CA, DM, ESRD (HD MWF).   Assessment / Plan / Recommendation Clinical Impression  Pt participated in speech/language/cognition evaluation. Pt denied any baseline deficits in speech, language, or cognition but remarked that she believes her mind has been "cloudy" since the CVA. Per the pt, she typically manages her finances and medications. 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 [facility] Mental Status Examination was completed to evaluate the pt's cognitive-linguistic skills. Her score was adjusted since she reported that she could not complete the written portions due to her hand "not wanting to work/cooperate". She achieved an adjusted score of 11/24 which is below the normal limits of 27 or more out of 30. She exhibited difficulty in the areas of awareness, attention, memory, complex problem solving, and executive function. Provision of additional processing time and repetition inconsistently improved accuracy. Her speech and language skills were WNL. Skilled SLP services are clinically indicated at this time to improve cognitive-linguistic function.    SLP Assessment  SLP Recommendation/Assessment: Patient needs continued Speech Lanaguage Pathology Services SLP Visit Diagnosis: Cognitive communication deficit (R41.841)    Follow Up Recommendations  Inpatient Rehab    Frequency and Duration min 2x/week  2 weeks      SLP Evaluation Cognition  Overall Cognitive Status: Impaired/Different from baseline Arousal/Alertness: Awake/alert Orientation Level: Oriented X4 Attention: Focused;Sustained Focused Attention: Impaired Focused Attention Impairment: Verbal complex Sustained Attention: Impaired Sustained Attention Impairment: Verbal complex Memory: Impaired Memory Impairment: Retrieval deficit;Storage deficit;Decreased recall of new information (Immediate: 3/5 with repetition; delayed: 4/5) Awareness: Impaired Awareness Impairment: Emergent impairment Problem Solving: Impaired Problem Solving Impairment: Verbal complex Executive Function: Sequencing;Organizing;Reasoning Reasoning: Appears intact Sequencing: Impaired Organizing: Impaired Organizing Impairment: Verbal complex (Backward digit span: 0/2)       Comprehension  Auditory Comprehension Overall Auditory Comprehension: Appears within functional limits for  tasks assessed Yes/No Questions: Within Functional Limits Commands: Within Functional Limits  Expression Expression Primary Mode of Expression: Verbal Verbal Expression Overall Verbal Expression: Appears within functional limits for tasks assessed Initiation: No impairment Level of Generative/Spontaneous Verbalization: Conversation Repetition: No impairment Naming: No impairment   Oral / Motor  Oral Motor/Sensory Function Overall Oral Motor/Sensory Function: Within functional limits Motor Speech Overall Motor Speech: Appears within functional limits for tasks assessed Respiration: Within functional limits Phonation: Normal Resonance: Within functional limits Articulation: Within functional limitis Intelligibility: Intelligible Motor Planning: Witnin functional limits Motor Speech Errors: Not applicable   Lauri Till I. [name], [place], [place] Office number [phone] Pager [place] 07/16/2020, 5:45 PM

## 2020-07-16 NOTE — Progress Notes (Addendum)
[facility] Progress Note   Assessment/ Plan:     OP HD: MWF [place]   3.5h  (running 3hrs on COVID shift this week)    400/800  79kg  3K/2.25 bath  P2  Hep 3500  RIJ TDC/ no AVF    - calcitriol 0.5 ug tiw   Assessment/ Plan: 1. Stroke - CT with basal ganglia hemorrhagic stroke--> likely ischemic with hemorrhagic conversion. Per neuro/admit 2. GI bleed- +FOBT, melena passed in ED, also ?hematemesis. Hgb stable 10's. Previously on ESA and iron. GI consulting, tenative plan for EGD today. 3. New onset fib - no known history. Not on anticoagulation. Rate controlled. per admit.  PO amio and dilt 4. Recent COVID Dx- diagnosed on 12/13 and received monoclonal antibodies 12/17. Not vaccinated. Per admit 5. ESRD- On HD MWF. HD Monday, HD 12/21 extra--> next HD off schedule Thursday and then back Friday to get back on regular schedule 6. Hypertension/volume- Blood pressure elevated, 2kg under dry wt. Will lower vol further as tol on HD based on high BP's and some edema.  7. Anemiaof CKD- Hgb 10.5. Not currently on ESA or iron. Follow trends.  8. Secondary Hyperparathyroidism -Ca at goal. Phos elevated as OP, continue binders and VDRA once eating. 9. Nutrition- currently NPO 10. Hypokalemia- on 88mEq potassium daily.    Subjective:    HD yesterday with 2.6L off.     Objective:   BP (!) 151/71 (BP Location: Left Arm)   Pulse 74   Temp 98.3 F (36.8 C) (Oral)   Resp 20   Ht 5\' 4"  (1.626 m)   Wt 76 kg   SpO2 99%   BMI 28.76 kg/m   Physical Exam:  Not examined today 12/22, see exam from yesterday 12/21  GEN confused, sitting in bed NECK + JVD PULM bilateral wheezing CV RRR ABD soft, obese EXT trace LE edema NEURO confused SKIN warm and dry ACCESS: RIJ [facility]  Labs: BMET Recent Labs  Lab 07/12/20 0947 07/12/20 0954 07/12/20 2308 07/13/20 0412 07/14/20 0232 07/15/20 0251 07/16/20 0146  NA 137 136 139 137 141 138 138  K 3.9 3.9 3.9  3.8 4.3 3.9 3.0*  CL 99 103 105 105 105 101 100  CO2 20*  --  19* 17* 16* 21* 24  GLUCOSE 154* 152* 143* 155* 122* 89 86  BUN 72* 76* 81* 84* 90* 41* 24*  CREATININE 5.34* 5.30* 5.59* 5.55* 6.73* 4.79* 4.12*  CALCIUM 8.1*  --  7.7* 7.6* 8.0* 7.7* 7.5*  PHOS  --   --   --   --  8.6*  --   --    CBC Recent Labs  Lab 07/12/20 0947 07/12/20 0954 07/13/20 0412 07/14/20 0232 07/15/20 0251 07/16/20 0146  WBC 5.4  --  5.3 7.9 6.0 4.6  NEUTROABS 4.6  --   --   --   --  2.9  HGB 10.8*   < > 11.4* 12.2 11.7* 10.7*  HCT 35.4*   < > 34.8* 38.4 35.9* 32.6*  MCV 95.2  --  89.5 90.8 90.0 91.6  PLT 123*  --  92* 112* 104* 113*   < > = values in this interval not displayed.      Medications:    . amiodarone  200 mg Oral Daily  . aspirin EC  81 mg Oral Daily  . atorvastatin  20 mg Oral QHS  . chlorhexidine  15 mL Mouth Rinse BID  . Chlorhexidine Gluconate Cloth  6  each Topical Daily  . Chlorhexidine Gluconate Cloth  6 each Topical Q0600  . diltiazem  120 mg Oral Daily  . hydrALAZINE  50 mg Oral Q8H  . insulin aspart  0-6 Units Subcutaneous Q4H  . mouth rinse  15 mL Mouth Rinse q12n4p  . mupirocin ointment  1 application Nasal BID  . pantoprazole  40 mg Oral BID     Madelon Lips, MD 07/16/2020, 11:37 AM

## 2020-07-16 NOTE — Progress Notes (Signed)
Occupational Therapy Evaluation Patient Details Name: [name] MRN: [id number] DOB: 05-24-1941 Today's Date: 07/16/2020    History of Present Illness Pt is a 79 y.o. female with initial COVID dx on 07/07/20 and underwent antibody infusion, admitted 07/12/20 as code stroke with L-side facial droop and flaccid paralysis. Workup revealed 7 mm hemorrhage in R basal ganglia; not a tPA or IR candidate. Pt also with new onset PAF, GIB. Plan for EGD 12/22. PMH includes CA, DM, ESRD (HD MWF).   Clinical Impression   PTA pt lives with her husband, who is in [place] hospital with Covid) and was modified independent with ADL and mobility @ RW level. Pt drove and managed her own finances and medication. Pt requires min A +2 for safety/equipment mangement for mobility and mod A for ADL due to deficits listed below. Pt with sensory motor impairment BUE and complaining of visual changes in addition to cognitive deficits.  BP 158/64 supine; 121/57 standing (complaining of dizziness); 121/70 post walk 12 ft; HR 80s; SpO2 87 RA without activity; SpO2 >88 on 2L with activity Feel pt will benefit from rehab at SNF to maximize functional level of independence and facilitate safe return home.     Follow Up Recommendations  SNF;Supervision/Assistance - 24 hour    Equipment Recommendations  3 in 1 bedside commode    Recommendations for Other Services       Precautions / Restrictions Precautions Precautions: Fall;Other (comment) Precaution Comments: Watch SpO2 Restrictions Weight Bearing Restrictions: No      Mobility Bed Mobility               General bed mobility comments: Received sitting in recliner    Transfers Overall transfer level: Needs assistance Equipment used: Rolling walker (2 wheeled) Transfers: Sit to/from Stand Sit to Stand: Min assist;+2 safety/equipment              Balance Overall balance assessment: Needs assistance   Sitting balance-Leahy Scale: Fair        Standing balance-Leahy Scale: Poor                             ADL either performed or assessed with clinical judgement   ADL Overall ADL's : Needs assistance/impaired Eating/Feeding: Set up;Supervision/ safety Eating/Feeding Details (indicate cue type and reason): may benefit from red tubing Grooming: Minimal assistance Grooming Details (indicate cue type and reason): perseverating on brushing teeth; brushed teethfor 15 min per nursing Upper Body Bathing: Minimal assistance;Sitting   Lower Body Bathing: Moderate assistance;Sit to/from stand   Upper Body Dressing : Moderate assistance;Sitting   Lower Body Dressing: Moderate assistance;Sit to/from stand   Toilet Transfer: Minimal assistance;+2 for safety/equipment;Ambulation   Toileting- Clothing Manipulation and Hygiene: Moderate assistance       Functional mobility during ADLs: Minimal assistance;+2 for safety/equipment;Rolling walker;Cueing for safety General ADL Comments: easily fatigues     Vision Baseline Vision/History: Wears glasses Wears Glasses: At all times Vision Assessment?: Yes Eye Alignment: Impaired (comment) Ocular Range of Motion: Within Functional Limits Alignment/Gaze Preference: Within Defined Limits Tracking/Visual Pursuits: Decreased smoothness of horizontal tracking;Decreased smoothness of vertical tracking Saccades: Additional eye shifts occurred during testing Visual Fields: Impaired-to be further tested in functional context Depth Perception: Overshoots Additional Comments: following with Dr Baird Cancer for retinal problems (vision deficits; missing targets in L upper quadrant)     Perception Perception Comments: will further assess; running into chair on R; difficultyproblemsolvingwithhow to navigate @ chair  Praxis Praxis Praxis tested?: Deficits Deficits: Perseveration;Limb apraxia Praxis-Other Comments: will further assess    Pertinent Vitals/Pain Pain Assessment: Faces Faces  Pain Scale: Hurts little more Pain Location: R hip pain Pain Descriptors / Indicators: Aching Pain Intervention(s): Limited activity within patient's tolerance     Hand Dominance Right   Extremity/Trunk Assessment Upper Extremity Assessment Upper Extremity Assessment: RUE deficits/detail;LUE deficits/detail RUE Deficits / Details: AROM WFL; strength @ 3+/5 throughout; pparent sensorymotor deficits affecting coordination; difficulty using as dominant UE RUE Coordination: decreased fine motor;decreased gross motor LUE Deficits / Details: AROM WFL; generalized weakness; using as dominant UE; appears to be having diffiuclty withcoordination LUE Coordination: decreased fine motor   Lower Extremity Assessment Lower Extremity Assessment: RLE deficits/detail;LLE deficits/detail RLE Deficits / Details: R hip flexion limited by c/o sacral/hip pain (at least 3/5 strength), ankle and knee WFL; reports baseline "tenderness" in BLEs RLE Sensation: decreased light touch;history of peripheral neuropathy LLE Deficits / Details: L hip flex 4/5, knee flex/ext 4/5, ankle WFL; reports baseline "tenderness" in BLEs LLE Sensation: decreased light touch;history of peripheral neuropathy   Cervical / Trunk Assessment Cervical / Trunk Assessment: Kyphotic (difficulty maintaining upright posture at times due to weakness; posterior bias with stabilizing legs against chair)   Communication Communication Communication: No difficulties   Cognition Arousal/Alertness: Awake/alert Behavior During Therapy: WFL for tasks assessed/performed Overall Cognitive Status: Impaired/Different from baseline Area of Impairment: Attention;Memory;Following commands;Safety/judgement;Awareness;Problem solving                     Memory: Decreased short-term memory Following Commands: Follows one step commands with increased time;Follows multi-step commands inconsistently Safety/Judgement: Decreased awareness of  safety;Decreased awareness of deficits Awareness: Emergent Problem Solving: Slow processing;Decreased initiation;Difficulty sequencing;Requires verbal cues;Requires tactile cues General Comments: Pt aware that she is "not thinking clearly." Increased time answering questions and following commands; reports shaking due to, "I'm nervous... my body isn't cooperating"   General Comments       Exercises     Shoulder Instructions      Home Living Family/patient expects to be discharged to:: Private residence Living Arrangements: Spouse/significant other;Other relatives Available Help at Discharge: Family;Available PRN/intermittently (Husband at [facility] with Covid) Type of Home: House Home Access: Ramped entrance     Home Layout: One level     Bathroom Shower/Tub: Occupational psychologist: Handicapped height Bathroom Accessibility: Yes How Accessible: Accessible via walker Home Equipment: [place] held shower head;Grab bars - tub/shower;Grab bars - toilet;Walker - 2 wheels          Prior Functioning/Environment Level of Independence: Independent        Comments: likes to do her rountine - getting breakfast, getting dressed; cooks/cleans; since being sick husband has helped. Reports h/o neuropathy and husband checks her feet for her        OT Problem List: Decreased strength;Decreased activity tolerance;Impaired balance (sitting and/or standing);Impaired vision/perception;Decreased coordination;Decreased cognition;Decreased safety awareness;Decreased knowledge of use of DME or AE;Cardiopulmonary status limiting activity;Impaired sensation;Obesity;Impaired UE functional use;Pain      OT Treatment/Interventions: Self-care/ADL training;Therapeutic exercise;Neuromuscular education;Energy conservation;DME and/or AE instruction;Therapeutic activities;Cognitive remediation/compensation;Visual/perceptual remediation/compensation;Patient/family education;Balance  training    OT Goals(Current goals can be found in the care plan section) Acute Rehab OT Goals Patient Stated Goal: to get back to living OT Goal Formulation: With patient Time For Goal Achievement: 07/30/20 Potential to Achieve Goals: Good  OT Frequency: Min 3X/week   Barriers to D/C:  Co-evaluation PT/OT/SLP Co-Evaluation/Treatment: Yes Reason for Co-Treatment: Complexity of the patient's impairments (multi-system involvement);For patient/therapist safety;To address functional/ADL transfers PT goals addressed during session: Mobility/safety with mobility;Balance;Proper use of DME OT goals addressed during session: ADL's and self-care      AM-PAC OT "6 Clicks" Daily Activity     Outcome Measure Help from another person eating meals?: A Little Help from another person taking care of personal grooming?: A Little Help from another person toileting, which includes using toliet, bedpan, or urinal?: A Lot Help from another person bathing (including washing, rinsing, drying)?: A Lot Help from another person to put on and taking off regular upper body clothing?: A Lot Help from another person to put on and taking off regular lower body clothing?: A Lot 6 Click Score: 14   End of Session Equipment Utilized During Treatment: Gait belt;Rolling walker;Oxygen (2L) Nurse Communication: Mobility status  Activity Tolerance: Patient tolerated treatment well Patient left: in chair;with call bell/phone within reach;with chair alarm set  OT Visit Diagnosis: Unsteadiness on feet (R26.81);Other abnormalities of gait and mobility (R26.89);Muscle weakness (generalized) (M62.81);Low vision, both eyes (H54.2);Other symptoms and signs involving cognitive function;Pain Pain - Right/Left: Right Pain - part of body: Hip                Time: 7829-5621 OT Time Calculation (min): 42 min Charges:  OT General Charges $OT Visit: 1 Visit OT Evaluation $OT Eval Moderate Complexity: [place], OT/L   Acute OT Clinical Specialist [place] Pager [phone] Office [phone]   [facility] 07/16/2020, 10:40 AM

## 2020-07-16 NOTE — Evaluation (Signed)
Physical Therapy Evaluation Patient Details Name: [name] MRN: [id number] DOB: [birth year] Today's Date: 07/16/2020   History of Present Illness  Pt is a 79 y.o. female with initial COVID dx on 07/07/20 and underwent antibody infusion, admitted 07/12/20 as code stroke with L-side facial droop and flaccid paralysis. Workup revealed 7 mm hemorrhage in R basal ganglia; not a tPA or IR candidate. Pt also with new onset PAF, GIB. Plan for EGD 12/22. PMH includes CA, DM, ESRD (HD MWF).    Clinical Impression  Pt presents with an overall decrease in functional mobility secondary to above. PTA, pt reports mod indep with RW, lives with husband who is currently admitted with COVID; other family lives nearby. Today, pt required consistent assist for standing mobility and ADL tasks. Pt limited by decreased activity tolerance, generalized weakness, apparent coordination deficits, apraxia(?), as well as other cognitive impairment. Pt would be a great candidate for intensive CIR-level therapies to maximize functional mobility and regain independent PLOF. If CIR not an option due to (+) COVID status, pt will require SNF.  Supine BP 154/64 Standing BP 121/57 (pt c/o dizziness requiring return to sit) Post-ambulation BP 121/70  HR 80s SpO2 >/88% on 2L O2 [place]    Follow Up Recommendations CIR;Supervision/Assistance - 24 hour    Equipment Recommendations   (TBD)    Recommendations for Other Services       Precautions / Restrictions Precautions Precautions: Fall;Other (comment) Precaution Comments: Watch SpO2 Restrictions Weight Bearing Restrictions: No      Mobility  Bed Mobility               General bed mobility comments: Received sitting in recliner    Transfers Overall transfer level: Needs assistance Equipment used: Rolling walker (2 wheeled) Transfers: Sit to/from Stand Sit to Stand: Min assist;+2 safety/equipment;Mod assist         General transfer comment: MinA for  trunk elevation and stability; c/o dizziness upon standing; leaning against recliner requiring modA to prevent LOB when recliner rolled away; cues to widen BOS  Ambulation/Gait Ambulation/Gait assistance: Min assist Gait Distance (Feet): 12 Feet Assistive device: Rolling walker (2 wheeled) Gait Pattern/deviations: Step-to pattern;Narrow base of support Gait velocity: Decreased Gait velocity interpretation: <1.31 ft/sec, indicative of household ambulator General Gait Details: Slow, unsteady gait with RW and minA for stability and RW navigation; very narrow BOS with in-toeing (slight scissoring?), difficulty correct despite cues  Stairs            Wheelchair Mobility    Modified Rankin (Stroke Patients Only) Modified Rankin (Stroke Patients Only) Pre-Morbid Rankin Score: No significant disability Modified Rankin: Moderately severe disability     Balance Overall balance assessment: Needs assistance   Sitting balance-Leahy Scale: Fair       Standing balance-Leahy Scale: Poor Standing balance comment: Reliant on UE support and external assist                             Pertinent Vitals/Pain Pain Assessment: Faces Faces Pain Scale: Hurts little more Pain Location: R hip pain Pain Descriptors / Indicators: Aching Pain Intervention(s): Limited activity within patient's tolerance    Home Living Family/patient expects to be discharged to:: Private residence Living Arrangements: Spouse/significant other;Other relatives Available Help at Discharge: Family;Available PRN/intermittently (Husband at [facility] with Covid) Type of Home: House Home Access: Ramped entrance     Home Layout: One level Home Equipment: Shower seat;Hand held shower head;Grab bars - tub/shower;Grab  bars - toilet;Walker - 2 wheels      Prior Function Level of Independence: Independent         Comments: likes to do her rountine - getting breakfast, getting dressed;  cooks/cleans; since being sick husband has helped. Reports h/o neuropathy and husband checks her feet for her     Hand Dominance   Dominant Hand: Right    Extremity/Trunk Assessment   Upper Extremity Assessment Upper Extremity Assessment: RUE deficits/detail;LUE deficits/detail RUE Deficits / Details: AROM WFL; strength @ 3+/5 throughout; pparent sensorymotor deficits affecting coordination; difficulty using as dominant UE RUE Coordination: decreased fine motor;decreased gross motor LUE Deficits / Details: AROM WFL; generalized weakness; using as dominant UE; appears to be having diffiuclty withcoordination LUE Coordination: decreased fine motor    Lower Extremity Assessment Lower Extremity Assessment: RLE deficits/detail;LLE deficits/detail RLE Deficits / Details: R hip flexion limited by c/o sacral/hip pain (at least 3/5 strength), ankle and knee WFL; reports baseline "tenderness" in BLEs RLE Sensation: decreased light touch;history of peripheral neuropathy LLE Deficits / Details: L hip flex 4/5, knee flex/ext 4/5, ankle WFL; reports baseline "tenderness" in BLEs LLE Sensation: decreased light touch;history of peripheral neuropathy    Cervical / Trunk Assessment Cervical / Trunk Assessment: Kyphotic (difficulty maintaining upright posture at times due to weakness; posterior bias with stabilizing legs against chair)  Communication   Communication: No difficulties  Cognition Arousal/Alertness: Awake/alert Behavior During Therapy: WFL for tasks assessed/performed Overall Cognitive Status: Impaired/Different from baseline Area of Impairment: Attention;Memory;Following commands;Safety/judgement;Awareness;Problem solving                     Memory: Decreased short-term memory Following Commands: Follows one step commands with increased time;Follows multi-step commands inconsistently Safety/Judgement: Decreased awareness of safety;Decreased awareness of deficits Awareness:  Emergent Problem Solving: Slow processing;Decreased initiation;Difficulty sequencing;Requires verbal cues;Requires tactile cues General Comments: Pt aware that she is "not thinking clearly." Increased time answering questions and following commands; reports shaking due to, "I'm nervous... my body isn't cooperating"      General Comments General comments (skin integrity, edema, etc.): SpO2 down to 86% on RA, maintaining >/88% on 2L O2 [place]    Exercises     Assessment/Plan    PT Assessment Patient needs continued PT services  PT Problem List Decreased strength;Decreased activity tolerance;Decreased balance;Decreased mobility;Decreased coordination;Decreased cognition;Cardiopulmonary status limiting activity;Impaired sensation       PT Treatment Interventions DME instruction;Gait training;Stair training;Functional mobility training;Therapeutic activities;Therapeutic exercise;Balance training;Neuromuscular re-education;Cognitive remediation;Patient/family education    PT Goals (Current goals can be found in the Care Plan section)  Acute Rehab PT Goals Patient Stated Goal: to "get back to living" PT Goal Formulation: With patient Time For Goal Achievement: 07/30/20 Potential to Achieve Goals: Good    Frequency Min 4X/week   Barriers to discharge Decreased caregiver support Husband also admitted with COVID    Co-evaluation PT/OT/SLP Co-Evaluation/Treatment: Yes Reason for Co-Treatment: Complexity of the patient's impairments (multi-system involvement);For patient/therapist safety;To address functional/ADL transfers PT goals addressed during session: Mobility/safety with mobility;Balance;Proper use of DME OT goals addressed during session: ADL's and self-care       AM-PAC PT "6 Clicks" Mobility  Outcome Measure Help needed turning from your back to your side while in a flat bed without using bedrails?: A Little Help needed moving from lying on your back to sitting on the side of a  flat bed without using bedrails?: A Little Help needed moving to and from a bed to a chair (including a wheelchair)?: A Little  Help needed standing up from a chair using your arms (e.g., wheelchair or bedside chair)?: A Little Help needed to walk in hospital room?: A Little Help needed climbing 3-5 steps with a railing? : A Lot 6 Click Score: 17    End of Session Equipment Utilized During Treatment: Gait belt Activity Tolerance: Patient tolerated treatment well;Patient limited by fatigue Patient left: in chair;with call bell/phone within reach Nurse Communication: Mobility status PT Visit Diagnosis: Other abnormalities of gait and mobility (R26.89);Other symptoms and signs involving the nervous system (R29.898)    Time: 3582-5189 PT Time Calculation (min) (ACUTE ONLY): 40 min   Charges:   PT Evaluation $PT Eval Moderate Complexity: [place], PT, DPT Acute Rehabilitation Services  Pager [phone] Office [place] 07/16/2020, 10:45 AM

## 2020-07-16 NOTE — TOC Initial Note (Signed)
Transition of Care [facility]) - Initial/Assessment Note    Patient Details  Name: [name] MRN: [id number] Date of Birth: April 21, 1941  Transition [facility]) CM/SW Contact:    Benard Halsted, [place] Phone Number: 07/16/2020, 4:33 PM  Clinical Narrative:                 Patient presents with a high hospital readmission risk score. CSW received SNF consult, however, currently there are no COVID accepting SNFs able to transport to dialysis. CSW will continue for updates.     Barriers to Discharge: Continued Medical Work up,SNF Pending bed offer,Insurance Authorization   Patient Goals and CMS Choice        Expected Discharge Plan and Services   In-house Referral: Clinical Social Work     Living arrangements for the past 2 months: Single Family Home                                      Prior Living Arrangements/Services Living arrangements for the past 2 months: Single Family Home Lives with:: Self Patient language and need for interpreter reviewed:: Yes        Need for Family Participation in Patient Care: Yes (Comment) Care giver support system in place?: Yes (comment)   Criminal Activity/Legal Involvement Pertinent to Current Situation/Hospitalization: No - Comment as needed  Activities of Daily Living Home Assistive Devices/Equipment: None ADL Screening (condition at time of admission) Patient's cognitive ability adequate to safely complete daily activities?: Yes Is the patient deaf or have difficulty hearing?: No Does the patient have difficulty seeing, even when wearing glasses/contacts?: No Does the patient have difficulty concentrating, remembering, or making decisions?: No Patient able to express need for assistance with ADLs?: Yes Does the patient have difficulty dressing or bathing?: Yes Independently performs ADLs?: No Communication: Independent Dressing (OT): Needs assistance Is this a change from baseline?: Change from baseline, expected to last  <3days Grooming: Independent Feeding: Independent Bathing: Needs assistance Is this a change from baseline?: Change from baseline, expected to last <3 days Toileting: Needs assistance Is this a change from baseline?: Change from baseline, expected to last <3 days In/Out Bed: Needs assistance Is this a change from baseline?: Change from baseline, expected to last <3 days Walks in Home: Independent Does the patient have difficulty walking or climbing stairs?: Yes Weakness of Legs: Both Weakness of Arms/Hands: Right  Permission Sought/Granted                  Emotional Assessment       Orientation: : Oriented to Self,Oriented to Place,Oriented to  Time,Oriented to Situation Alcohol / Substance Use: Not Applicable Psych Involvement: No (comment)  Admission diagnosis:  GI bleed [K92.2] Upper GI bleed [K92.2] CVA (cerebral vascular accident) ([place]) [I63.9] Atrial fibrillation with RVR ([place]) [I48.91] ESRD on hemodialysis ([place]) [N18.6, Z99.2] Right-sided nontraumatic intracerebral hemorrhage, unspecified cerebral location ([place]) [I61.9] Pneumonia due to COVID-19 virus [U07.1, J12.82] Patient Active Problem List   Diagnosis Date Noted  . ESRD on hemodialysis ([place])   . Upper GI bleed   . Pneumonia due to COVID-19 virus   . Atrial fibrillation with RVR ([place])   . Right-sided nontraumatic intracerebral hemorrhage ([place])   . CVA (cerebral vascular accident) ([place]) 07/12/2020  . Fall 07/06/2018  . Left temporal lobe infarction ([place]) 09/05/2017  . TIA (transient ischemic attack) 09/04/2017  . CKD (chronic kidney disease) stage 3,  GFR 30-59 ml/min ([place]) 09/03/2017  . Essential hypertension 09/03/2017  . Type 2 diabetes mellitus with stage 3 chronic kidney disease ([place]) 09/03/2017  . Lobular carcinoma of right breast ([place]) 03/01/2011   PCP:  [name], MD Pharmacy:   [facility] - [place], [address] [place]  [address] Phone: [phone] Fax: [phone]  [id number] - [place], [address] [place] [address] Phone: [phone] Fax: [phone]     Social Determinants of Health (SDOH) Interventions    Readmission Risk Interventions No flowsheet data found.

## 2020-07-16 NOTE — Plan of Care (Signed)
  Problem: Clinical Measurements: Goal: Diagnostic test results will improve Outcome: Progressing   Problem: Nutrition: Goal: Adequate nutrition will be maintained Outcome: Progressing   Problem: Safety: Goal: Ability to remain free from injury will improve Outcome: Progressing   Problem: Respiratory: Goal: Will maintain a patent airway Outcome: Progressing

## 2020-07-17 ENCOUNTER — Inpatient Hospital Stay (HOSPITAL_COMMUNITY): Payer: Medicare HMO | Admitting: Anesthesiology

## 2020-07-17 ENCOUNTER — Encounter (HOSPITAL_COMMUNITY): Payer: Self-pay | Admitting: Pulmonary Disease

## 2020-07-17 ENCOUNTER — Encounter (HOSPITAL_COMMUNITY)
Admission: EM | Disposition: A | Discharge status: Skilled Nursing Facility | Payer: Self-pay | Source: Home / Self Care | Attending: Internal Medicine

## 2020-07-17 DIAGNOSIS — K2970 Gastritis, unspecified, without bleeding: Secondary | ICD-10-CM

## 2020-07-17 DIAGNOSIS — K2990 Gastroduodenitis, unspecified, without bleeding: Secondary | ICD-10-CM

## 2020-07-17 HISTORY — PX: BIOPSY: SHX5522

## 2020-07-17 HISTORY — PX: ESOPHAGOGASTRODUODENOSCOPY (EGD) WITH PROPOFOL: SHX5813

## 2020-07-17 LAB — COMPREHENSIVE METABOLIC PANEL
ALT: 23 U/L (ref 0–44)
AST: 45 U/L — ABNORMAL HIGH (ref 15–41)
Albumin: 2.8 g/dL — ABNORMAL LOW (ref 3.5–5.0)
Alkaline Phosphatase: 72 U/L (ref 38–126)
Anion gap: 17 — ABNORMAL HIGH (ref 5–15)
BUN: 37 mg/dL — ABNORMAL HIGH (ref 8–23)
CO2: 23 mmol/L (ref 22–32)
Calcium: 8.3 mg/dL — ABNORMAL LOW (ref 8.9–10.3)
Chloride: 98 mmol/L (ref 98–111)
Creatinine, Ser: 5.93 mg/dL — ABNORMAL HIGH (ref 0.44–1.00)
GFR, Estimated: 7 mL/min — ABNORMAL LOW (ref >60)
Glucose, Bld: 221 mg/dL — ABNORMAL HIGH (ref 70–99)
Potassium: 3.6 mmol/L (ref 3.5–5.1)
Sodium: 138 mmol/L (ref 135–145)
Total Bilirubin: 0.7 mg/dL (ref 0.3–1.2)
Total Protein: 6.4 g/dL — ABNORMAL LOW (ref 6.5–8.1)

## 2020-07-17 LAB — CBC WITH DIFFERENTIAL/PLATELET
Abs Immature Granulocytes: 0.09 10*3/uL — ABNORMAL HIGH (ref 0.00–0.07)
Basophils Absolute: 0 10*3/uL (ref 0.0–0.1)
Basophils Relative: 0 %
Eosinophils Absolute: 0 10*3/uL (ref 0.0–0.5)
Eosinophils Relative: 1 %
HCT: 33.6 % — ABNORMAL LOW (ref 36.0–46.0)
Hemoglobin: 11 g/dL — ABNORMAL LOW (ref 12.0–15.0)
Immature Granulocytes: 2 %
Lymphocytes Relative: 10 %
Lymphs Abs: 0.6 10*3/uL — ABNORMAL LOW (ref 0.7–4.0)
MCH: 29.3 pg (ref 26.0–34.0)
MCHC: 32.7 g/dL (ref 30.0–36.0)
MCV: 89.4 fL (ref 80.0–100.0)
Monocytes Absolute: 0.8 10*3/uL (ref 0.1–1.0)
Monocytes Relative: 14 %
Neutro Abs: 4.3 10*3/uL (ref 1.7–7.7)
Neutrophils Relative %: 73 %
Platelets: 136 10*3/uL — ABNORMAL LOW (ref 150–400)
RBC: 3.76 MIL/uL — ABNORMAL LOW (ref 3.87–5.11)
RDW: 17.2 % — ABNORMAL HIGH (ref 11.5–15.5)
WBC: 5.8 10*3/uL (ref 4.0–10.5)
nRBC: 0 % (ref 0.0–0.2)

## 2020-07-17 LAB — GLUCOSE, CAPILLARY
Glucose-Capillary: 132 mg/dL — ABNORMAL HIGH (ref 70–99)
Glucose-Capillary: 148 mg/dL — ABNORMAL HIGH (ref 70–99)
Glucose-Capillary: 150 mg/dL — ABNORMAL HIGH (ref 70–99)
Glucose-Capillary: 150 mg/dL — ABNORMAL HIGH (ref 70–99)
Glucose-Capillary: 176 mg/dL — ABNORMAL HIGH (ref 70–99)
Glucose-Capillary: 197 mg/dL — ABNORMAL HIGH (ref 70–99)
Glucose-Capillary: 227 mg/dL — ABNORMAL HIGH (ref 70–99)

## 2020-07-17 LAB — D-DIMER, QUANTITATIVE: D-Dimer, Quant: 1.86 ug/mL-FEU — ABNORMAL HIGH (ref 0.00–0.50)

## 2020-07-17 LAB — C-REACTIVE PROTEIN: CRP: 12.1 mg/dL — ABNORMAL HIGH (ref <1.0)

## 2020-07-17 LAB — MAGNESIUM: Magnesium: 2.1 mg/dL (ref 1.7–2.4)

## 2020-07-17 LAB — BRAIN NATRIURETIC PEPTIDE: B Natriuretic Peptide: 226.6 pg/mL — ABNORMAL HIGH (ref 0.0–100.0)

## 2020-07-17 SURGERY — ESOPHAGOGASTRODUODENOSCOPY (EGD) WITH PROPOFOL
Anesthesia: General

## 2020-07-17 MED ORDER — FENTANYL CITRATE (PF) 100 MCG/2ML IJ SOLN
INTRAMUSCULAR | Status: AC
  Filled 2020-07-17: qty 2

## 2020-07-17 MED ORDER — PHENYLEPHRINE HCL (PRESSORS) 10 MG/ML IV SOLN
INTRAVENOUS | Status: DC | PRN
  Administered 2020-07-17 (×2): 80 ug via INTRAVENOUS

## 2020-07-17 MED ORDER — LACTATED RINGERS IV SOLN: INTRAVENOUS | Status: DC | PRN

## 2020-07-17 MED ORDER — HEPARIN SODIUM (PORCINE) 1000 UNIT/ML IJ SOLN
1000.0000 [IU] | INTRAMUSCULAR | Status: DC | PRN
  Administered 2020-07-28: 3800 [IU] via INTRAVENOUS

## 2020-07-17 MED ORDER — MIDODRINE HCL 5 MG PO TABS: 10.0000 mg | ORAL_TABLET | Freq: Once | ORAL | Status: DC

## 2020-07-17 MED ORDER — PROPOFOL 10 MG/ML IV BOLUS
INTRAVENOUS | Status: DC | PRN
  Administered 2020-07-17: 120 mg via INTRAVENOUS

## 2020-07-17 MED ORDER — HEPARIN SODIUM (PORCINE) 1000 UNIT/ML IJ SOLN
INTRAMUSCULAR | Status: AC
  Administered 2020-07-17: 3800 [IU] via INTRAVENOUS
  Filled 2020-07-17: qty 4

## 2020-07-17 MED ORDER — FENTANYL CITRATE (PF) 100 MCG/2ML IJ SOLN
INTRAMUSCULAR | Status: DC | PRN
  Administered 2020-07-17: 50 ug via INTRAVENOUS
  Administered 2020-07-17 (×2): 25 ug via INTRAVENOUS

## 2020-07-17 MED ORDER — SUCCINYLCHOLINE CHLORIDE 20 MG/ML IJ SOLN
INTRAMUSCULAR | Status: DC | PRN
  Administered 2020-07-17: 100 mg via INTRAVENOUS

## 2020-07-17 MED ORDER — LIDOCAINE HCL (CARDIAC) PF 100 MG/5ML IV SOSY
PREFILLED_SYRINGE | INTRAVENOUS | Status: DC | PRN
  Administered 2020-07-17: 10 mg via INTRATRACHEAL

## 2020-07-17 SURGICAL SUPPLY — 15 items

## 2020-07-17 NOTE — Anesthesia Procedure Notes (Addendum)
Procedure Name: Intubation Date/Time: 07/17/2020 2:55 PM Performed by: Lavell Luster, CRNA Pre-anesthesia Checklist: Patient identified, Emergency Drugs available, Suction available, Patient being monitored and Timeout performed Patient Re-evaluated:Patient Re-evaluated prior to induction Oxygen Delivery Method: Circle system utilized Preoxygenation: Pre-oxygenation with 100% oxygen Induction Type: IV induction and Rapid sequence Laryngoscope Size: Mac, Glidescope and 3 Grade View: Grade I Tube type: Oral Tube size: 7.0 mm Number of attempts: 1 Airway Equipment and Method: Stylet and Video-laryngoscopy Placement Confirmation: ETT inserted through vocal cords under direct vision,  positive ETCO2 and breath sounds checked- equal and bilateral Secured at: 21 cm Tube secured with: Tape Dental Injury: Teeth and Oropharynx as per pre-operative assessment

## 2020-07-17 NOTE — Plan of Care (Signed)

## 2020-07-17 NOTE — Op Note (Signed)
[facility] Patient Name: [name] Procedure Date : 07/17/2020 MRN: [id number] Attending MD: Carlota Raspberry. Havery Moros , MD Date of Birth: 1941-04-05 CSN: [id number] Age: 79 Admit Type: Inpatient Procedure:                Upper GI endoscopy Indications:              Recent gastrointestinal bleeding - dark stools,                            anemia, recent CVA, AF - in need of anticoagulation Providers:                Carlota Raspberry. Havery Moros, MD, Kary Kos RN, RN, Tyna Jaksch Technician Referring MD:              Medicines:                Monitored Anesthesia Care Complications:            No immediate complications. Estimated blood loss:                            Minimal. Estimated Blood Loss:     Estimated blood loss was minimal. Procedure:                Pre-Anesthesia Assessment:                           - Prior to the procedure, a History and Physical                            was performed, and patient medications and                            allergies were reviewed. The patient's tolerance of                            previous anesthesia was also reviewed. The risks                            and benefits of the procedure and the sedation                            options and risks were discussed with the patient.                            All questions were answered, and informed consent                            was obtained. Prior Anticoagulants: The patient has                            taken no previous anticoagulant or antiplatelet  agents. ASA Grade Assessment: III - A patient with                            severe systemic disease. After reviewing the risks                            and benefits, the patient was deemed in                            satisfactory condition to undergo the procedure.                           After obtaining informed consent, the endoscope was                             passed under direct vision. Throughout the                            procedure, the patient's blood pressure, pulse, and                            oxygen saturations were monitored continuously. The                            GIF-H190 ([id number]) Olympus gastroscope was                            introduced through the mouth, and advanced to the                            second part of duodenum. The upper GI endoscopy was                            accomplished without difficulty. The patient                            tolerated the procedure well. Scope In: Scope Out: Findings:      Esophagogastric landmarks were identified: the Z-line was found at 40       cm, the gastroesophageal junction was found at 40 cm and the upper       extent of the gastric folds was found at 40 cm from the incisors.      The Z-line was slightly irregular and was found 40 cm from the incisors,       bu did not meet criteria for Barrett's biopsies..      The exam of the esophagus was otherwise normal.      Patchy inflammation characterized by erosions, erythema and friability       was found in the gastric fundus and in the proximal gastric body.      The exam of the stomach was otherwise normal.      Biopsies were taken with a cold forceps in the gastric body, at the       incisura and in the gastric antrum for Helicobacter pylori testing.      Patchy inflammation characterized by congestion (  edema), erosions and       erythema was found in the duodenal bulb and in the second portion of the       duodenum. A focal smal clean based duodenal ulcer was noted in the 2nd       portion. The sweep was edematous and difficult to evaluate but no       obvious ulcerst there. Biopsies were taken with a cold forceps for       histology.      The exam of the duodenum was otherwise normal. Impression:               - Esophagogastric landmarks identified.                           - Z-line irregular, 40 cm from the  incisors, did                            not meet criteria for Barrett's.                           - Gastritis.                           - Normal stomach otherwise - biopsies taken to rule                            out H pylori                           - Duodenitis with small clean based duodenal ulcer.                            Biopsied.                           Suspect duodenal ulcer / duodenitis / gastritis                            caused bleeding. Appears to be healing on PPI, no                            high risk lesions. If she needs anticoagulation in                            the near future would trial with heparin and see if                            she tolerates it. Otherwise continue twice daily                            protonix 40mg . Recommendation:           - Return patient to hospital ward for ongoing care.                           - Advance diet as tolerated.                           -  Continue present medications.                           - Await pathology results.                           - Consideration for starting heparin later today as                            above, will discuss with the primary service. No                            high risk lesions on this exam for recurrent                            bleeding.                           - GI service will sign off for now, please call                            with questions or recurrent bleeding moving forward Procedure Code(s):        --- Professional ---                           [phone], Esophagogastroduodenoscopy, flexible,                            transoral; with biopsy, single or multiple Diagnosis Code(s):        --- Professional ---                           K22.8, Other specified diseases of esophagus                           K29.70, Gastritis, unspecified, without bleeding                           K29.80, Duodenitis without bleeding                           K92.2,  Gastrointestinal hemorrhage, unspecified CPT copyright 2019 American Medical Association. All rights reserved. The codes documented in this report are preliminary and upon coder review may  be revised to meet current compliance requirements. Remo Lipps P. Havery Moros, MD 07/17/2020 3:24:12 PM This report has been signed electronically. Number of Addenda: 0

## 2020-07-17 NOTE — Progress Notes (Signed)
[facility] Progress Note   Assessment/ Plan:     OP HD: MWF [place]   3.5h  (running 3hrs on COVID shift this week)    400/800  79kg  3K/2.25 bath  P2  Hep 3500  RIJ TDC/ no AVF    - calcitriol 0.5 ug tiw   Assessment/ Plan: 1. Stroke - CT with basal ganglia hemorrhagic stroke--> likely ischemic with hemorrhagic conversion. Per neuro/admit 2. GI bleed- +FOBT, melena passed in ED, also ?hematemesis. Hgb stable 10's. Previously on ESA and iron. GI consulting, for EGD today 3. New onset fib - no known history. Not on anticoagulation. Rate controlled. per admit.  PO amio and dilt 4. Recent COVID Dx- diagnosed on 12/13 and received monoclonal antibodies 12/17. Not vaccinated. Per admit 5. ESRD- On HD MWF. HD Monday, HD 12/21 extra--> next HD off schedule Thursday and then back Friday to get back on regular schedule 6. Hypertension/volume- Blood pressure elevated, 2kg under dry wt. Will lower vol further as tol on HD based on high BP's and some edema.  7. Anemiaof CKD- Hgb 10.5. Not currently on ESA or iron. Follow trends.  8. Secondary Hyperparathyroidism -Ca at goal. Phos elevated as OP, continue binders and VDRA once eating. 9. Nutrition- currently NPO 10. Hypokalemia- on 57mEq potassium daily.    Subjective:    Seen on dialysis- 2.5-3L goal.     Objective:   BP 93/64   Pulse 82   Temp 98.9 F (37.2 C) (Oral)   Resp 20   Ht 5\' 4"  (1.626 m)   Wt 76.7 kg   SpO2 96%   BMI 29.02 kg/m   Physical Exam:  GEN sitting in bed NECK + JVD PULM 3L [place] O2 CV RRR ABD soft, obese EXT trace LE edema SKIN warm and dry ACCESS: R[facility]  Labs: BMET Recent Labs  Lab 07/12/20 0947 07/12/20 0954 07/12/20 2308 07/13/20 0412 07/14/20 0232 07/15/20 0251 07/16/20 0146 07/17/20 0152  NA 137 136 139 137 141 138 138 138  K 3.9 3.9 3.9 3.8 4.3 3.9 3.0* 3.6  CL 99 103 105 105 105 101 100 98  CO2 20*  --  19* 17* 16* 21* 24 23  GLUCOSE 154* 152*  143* 155* 122* 89 86 221*  BUN 72* 76* 81* 84* 90* 41* 24* 37*  CREATININE 5.34* 5.30* 5.59* 5.55* 6.73* 4.79* 4.12* 5.93*  CALCIUM 8.1*  --  7.7* 7.6* 8.0* 7.7* 7.5* 8.3*  PHOS  --   --   --   --  8.6*  --   --   --    CBC Recent Labs  Lab 07/12/20 0947 07/12/20 0954 07/14/20 0232 07/15/20 0251 07/16/20 0146 07/17/20 0152  WBC 5.4   < > 7.9 6.0 4.6 5.8  NEUTROABS 4.6  --   --   --  2.9 4.3  HGB 10.8*   < > 12.2 11.7* 10.7* 11.0*  HCT 35.4*   < > 38.4 35.9* 32.6* 33.6*  MCV 95.2   < > 90.8 90.0 91.6 89.4  PLT 123*   < > 112* 104* 113* 136*   < > = values in this interval not displayed.      Medications:    . amiodarone  200 mg Oral Daily  . aspirin EC  81 mg Oral Daily  . atorvastatin  20 mg Oral QHS  . chlorhexidine  15 mL Mouth Rinse BID  . Chlorhexidine Gluconate Cloth  6 each Topical Daily  .  Chlorhexidine Gluconate Cloth  6 each Topical Q0600  . diltiazem  120 mg Oral Daily  . heparin sodium (porcine)      . hydrALAZINE  50 mg Oral Q8H  . insulin aspart  0-6 Units Subcutaneous Q4H  . mouth rinse  15 mL Mouth Rinse q12n4p  . mupirocin ointment  1 application Nasal BID  . pantoprazole  40 mg Oral BID     Madelon Lips, MD 07/17/2020, 11:23 AM

## 2020-07-17 NOTE — Progress Notes (Signed)
Patient going to ENDO. No acute distress noted at this time, no complaints.

## 2020-07-17 NOTE — Progress Notes (Addendum)
Patient back from dialysis. She didn't went to Endo this morning. Endo was re-scheduled for this afternoon. Alert and oriented x 4, no acute distress noted, no complaints. VS stable.

## 2020-07-17 NOTE — Interval H&P Note (Signed)
History and Physical Interval Note: Patient here in endoscopy unit for EGD today. I have discussed risks / benefits of EGD and anesthesia with her and she wishes to proceed. Hgb stable. No active bleeding. All questions answered, further recommendations pending results.   07/17/2020 2:41 PM  [name]  has presented today for surgery, with the diagnosis of CGE.  Black stools.  Anemia.  The various methods of treatment have been discussed with the patient and family. After consideration of risks, benefits and other options for treatment, the patient has consented to  Procedure(s): ESOPHAGOGASTRODUODENOSCOPY (EGD) WITH PROPOFOL (N/A) as a surgical intervention.  The patient's history has been reviewed, patient examined, no change in status, stable for surgery.  I have reviewed the patient's chart and labs.  Questions were answered to the patient's satisfaction.     [place]

## 2020-07-17 NOTE — Progress Notes (Signed)
Patient back from Endo. Alert and oriented, VS stable, no acute distress noted, no complaints.

## 2020-07-17 NOTE — Transfer of Care (Signed)
Immediate Anesthesia Transfer of Care Note  Patient: [name]  Procedure(s) Performed: ESOPHAGOGASTRODUODENOSCOPY (EGD) WITH PROPOFOL (N/A ) BIOPSY  Patient Location: Endoscopy Unit  Anesthesia Type:General  Level of Consciousness: awake and sedated  Airway & Oxygen Therapy: Patient connected to nasal cannula oxygen  Post-op Assessment: Post -op Vital signs reviewed and stable  Post vital signs: stable  Last Vitals:  Vitals Value Taken Time  BP    Temp    Pulse    Resp    SpO2      Last Pain:  Vitals:   07/17/20 1348  TempSrc: Oral  PainSc:       Patients Stated Pain Goal: 3 (83/43/73 [id number])  Complications: No complications documented.

## 2020-07-17 NOTE — Anesthesia Preprocedure Evaluation (Addendum)
Anesthesia Evaluation  Patient identified by MRN, date of birth, ID band Patient awake    Reviewed: Allergy & Precautions, NPO status , Patient's Chart, lab work & pertinent test results, Unable to perform ROS - Chart review only  History of Anesthesia Complications Negative for: history of anesthetic complications  Airway Mallampati: II   Neck ROM: Full    Dental no notable dental hx. (+) Dental Advisory Given   Pulmonary pneumonia,  Covid +   Pulmonary exam normal        Cardiovascular hypertension, Pt. on home beta blockers and Pt. on medications Normal cardiovascular exam+ Valvular Problems/Murmurs MR   Echo 07/14/2020 1. Left ventricular ejection fraction, by estimation, is 60 to 65%. The left ventricle has normal function. The left ventricle has no regional wall motion abnormalities. There is moderate left ventricular hypertrophy. Left ventricular diastolic parameters are consistent with Grade I diastolic dysfunction (impaired relaxation). Elevated left ventricular end-diastolic pressure. The E/e' is 101.  2. Right ventricular systolic function is normal. The right ventricular size is normal.  3. The pericardial effusion is posterior to the left ventricle.  4. The mitral valve is abnormal. Mild mitral valve regurgitation. Moderate mitral annular calcification.  5. The aortic valve is tricuspid. Aortic valve regurgitation is not visualized. Mild aortic valve sclerosis is present, with no evidence of aortic valve stenosis.    Neuro/Psych TIACVA    GI/Hepatic negative GI ROS, Neg liver ROS,   Endo/Other  negative endocrine ROSdiabetes  Renal/GU CRF, ESRF and DialysisRenal disease     Musculoskeletal negative musculoskeletal ROS (+)   Abdominal   Peds  Hematology negative hematology ROS (+)   Anesthesia Other Findings   Reproductive/Obstetrics                           Anesthesia  Physical Anesthesia Plan  ASA: III  Anesthesia Plan: General   Post-op Pain Management:    Induction: Intravenous, Rapid sequence and Cricoid pressure planned  PONV Risk Score and Plan: 3 and Ondansetron, Dexamethasone and Treatment may vary due to age or medical condition  Airway Management Planned: Oral ETT  Additional Equipment: None  Intra-op Plan:   Post-operative Plan: Extubation in OR  Informed Consent: I have reviewed the patients History and Physical, chart, labs and discussed the procedure including the risks, benefits and alternatives for the proposed anesthesia with the patient or authorized representative who has indicated his/her understanding and acceptance.     Dental advisory given  Plan Discussed with: CRNA  Anesthesia Plan Comments:        Anesthesia Quick Evaluation

## 2020-07-17 NOTE — Progress Notes (Addendum)
PT Cancellation Note  Patient Details Name: BEV DRENNEN MRN: [id number] DOB: 12-30-1940   Cancelled Treatment:    Reason Eval/Treat Not Completed: Patient at procedure or test/unavailable. Will follow-up for PT treatment as schedule permits.  Mabeline Caras, PT, DPT Acute Rehabilitation Services  Pager [phone] Office [place] 07/17/2020, 8:04 AM

## 2020-07-17 NOTE — Progress Notes (Signed)
Patient went to ENDO. No acute distress noted at this time, no complaints.

## 2020-07-17 NOTE — Progress Notes (Addendum)
PROGRESS NOTE                                                                                                                                                                                                             Patient Demographics:    [name], is a 79 y.o. female, DOB - 03/08/1941, WCB:[id number]  Outpatient Primary MD for the patient is [name], MD    LOS - 5  Admit date - 07/12/2020    Chief Complaint  Patient presents with  . Code Stroke  . GI Bleeding       Brief Narrative (HPI from H&P) - this is a 79 year old female with history of hypertension, lateral hip fractures, right-sided breast cancer s/p treatment including lumpectomy about 5 years ago, ESRD, dyslipidemia, DM type II, A. fib who was brought in the hospital on 07/12/2020 with altered mental status, was found to have ischemic stroke with hemorrhagic conversion, her stay was complicated by GI bleed and COVID-19 infection.  She was initially admitted under PCCM and was transferred to hospitalist service on 07/15/2020.  She has been seen so far by nephrology, neurology and PCCM.   Subjective:   Patient in bed, appears comfortable, denies any headache, no fever, no chest pain or pressure, no shortness of breath , no abdominal pain. No focal weakness, minimal grip issues in the right hand off and on for a few days according to patient   Assessment  & Plan :     1. Acute right basal ganglia hemorrhagic stroke- with minimal left-sided weakness, this appears to be ischemic infarct with hemorrhagic conversion due to A. Fib.  Seen by stroke team, case discussed with Dr. Leonie Man on 07/15/2020.  For now baby aspirin along with home dose statin.  LDL was within acceptable limits, echocardiogram nonacute, A1c slightly elevated will defer for PCP for better glycemic control.  Continue PT-OT and speech therapy.  Resume anticoagulation when stable from GI  standpoint.  Repeat noncontrast head CT 07/16/2020 as she has some grip issues in the right hand off and on, discussed with Dr. Leonie Man no new recommendations.  2.  A. fib RVR.  Likely found this admission.  She is currently in and out of A. fib and RVR, have started her on Cardizem, amiodarone added by cardiology, stable TSH & echocardiogram noted with preserved  EF, currently no anticoagulation due to recent bleed, discussed the case with neurology, audiology following, per neurology if EGD shows no signs of bleeding and H&H stable can start Eliquis in a few days after stable EGD.  3. Acute GI bleed with blood loss related anemia.  Appears to be upper GI.  S/p packed RBC transfusion on 07/12/2020 1 unit, monitor H&H, PPI, GI on board. EGD likely 07/17/20 [facility].  4.  ESRD.  On MWF schedule.  Case discussed with Dr. Hollie Salk nephrologist, might get an extra done on 07/15/2020  5.  Dyslipidemia.  On statin.  6.  Essential hypertension.  Diltiazem added hydralazine for better control.  7.  Mild metabolic encephalopathy.  Worsening due to recent stroke.  Supportive care.  8.  COVID-19 infection diagnosed on 07/07/2020.  S/p antibody infusion.  Monitor.    9.  History of right-sided breast cancer.  Outpatient follow-up with oncology doses 42-year-old problem.    10.  Hip pain.  Chronic.  CT scan stable.  Monitor with supportive care.  PT OT.    11. DM type II.  On sliding scale monitor and adjust. DM and Insulin education.  Lab Results  Component Value Date   HGBA1C 7.9 (H) 07/13/2020    CBG (last 3)  Recent Labs    07/17/20 0413 07/17/20 0732 07/17/20 1224  GLUCAP 176* 150* 132*    Lab Results  Component Value Date   CHOL 99 07/14/2020   HDL 31 (L) 07/14/2020   LDLCALC 32 07/14/2020   TRIG 178 (H) 07/14/2020   CHOLHDL 3.2 07/14/2020    Lab Results  Component Value Date   HGBA1C 7.9 (H) 07/13/2020      Lab Results  Component Value Date   TSH 1.219 07/16/2020         Condition - Extremely Guarded  Family Communication  :   Husband Mallie Mussel [phone] on 07/15/2020 message left at 11:36 AM.    Fredric Dine [phone] on 07/15/2020,  07/16/20, 07/17/20 message left at 2pm  Code Status :  Full  Consults  : Neurology, PCCM, nephrology, cardiology  Procedures  :    CT HEAD  07/12/2020  -   Acute 7 mm hemorrhage within the right basal ganglia. No substantial mass effect. Chronic microvascular ischemic disease and remote right cerebellar lacunar infarct.   CT HEAD WO CONTRAST - 07/13/2020 expected evolutionary changes in the acute right basal ganglia hemorrhage overall similar in size with slightly decreased density compared to previous CT.  No new abnormality.  MRI / MRA Head - unchanged 1 cm right posterior lentiform nucleus/internal capsule hemorrhage without significant surrounding edema or mass-effect.  4 mm diffusion abnormality involving subcortical white matter in the right parietal corona radiata consistent with acute small vessel type infarct.    MRA of the brain technically limited due to motion artifact but no large vessel occlusion.  Focal high-grade stenosis of the right distal M1 segment and distal left M1 segment bifurcation.  Severe multifocal bilateral P2 and distal left P3 stenosis.  Transthoracic Echocardiogram - left ventricular ejection fraction 60 to 65%.  00/00/2021  Bilateral Carotid Dopplers - bilateral 1-39% stenosis of carotids.    PUD Prophylaxis : PPI  Disposition Plan  :    Status is: Inpatient  Remains inpatient appropriate because:IV treatments appropriate due to intensity of illness or inability to take PO   Dispo: The patient is from: Home              Anticipated  d/c is to: SNF              Anticipated d/c date is: > 3 days              Patient currently is not medically stable to d/c.   DVT Prophylaxis  :   SCDs    Lab Results  Component Value Date   PLT 136 (L) 07/17/2020    Diet :   Diet Order            Diet NPO time specified Except for: Sips with Meds  Diet effective midnight                  Inpatient Medications  Scheduled Meds: . amiodarone  200 mg Oral Daily  . aspirin EC  81 mg Oral Daily  . atorvastatin  20 mg Oral QHS  . chlorhexidine  15 mL Mouth Rinse BID  . Chlorhexidine Gluconate Cloth  6 each Topical Daily  . Chlorhexidine Gluconate Cloth  6 each Topical Q0600  . diltiazem  120 mg Oral Daily  . insulin aspart  0-6 Units Subcutaneous Q4H  . mouth rinse  15 mL Mouth Rinse q12n4p  . midodrine  10 mg Oral Once  . mupirocin ointment  1 application Nasal BID  . pantoprazole  40 mg Oral BID   Continuous Infusions: . sodium chloride 10 mL/hr at 07/13/20 1900   PRN Meds:.sodium chloride, docusate sodium, guaiFENesin-dextromethorphan, heparin sodium (porcine), hydrALAZINE, metoprolol tartrate, ondansetron (ZOFRAN) IV, polyethylene glycol, sodium chloride flush  Antibiotics  :    Anti-infectives (From admission, onward)   None       Time Spent in minutes  30   Lala Lund M.D on 07/17/2020 at 2:11 PM  To page go to [email or web address]   [facility] -  Office  [phone]   See all Orders from today for further details    Objective:   Vitals:   07/17/20 1130 07/17/20 1140 07/17/20 1221 07/17/20 1348  BP: 135/73 (!) 157/87 122/69 93/73  Pulse:   97 100  Resp: 20 18 17 17   Temp:  98.1 F (36.7 C) 98.7 F (37.1 C) 97.8 F (36.6 C)  TempSrc:  Oral Oral Oral  SpO2:  97% 95% 94%  Weight:  72.9 kg    Height:        Wt Readings from Last 3 Encounters:  07/17/20 72.9 kg  04/21/20 77.9 kg  04/20/19 90.2 kg     Intake/Output Summary (Last 24 hours) at 07/17/2020 1411 Last data filed at 07/17/2020 1140 Gross per 24 hour  Intake --  Output 2649 ml  Net -2649 ml     Physical Exam  Awake more alert, No new F.N deficits, lack of blinking in the left eye, [place].AT,PERRAL Supple Neck,No JVD, No cervical lymphadenopathy  appriciated.  Symmetrical Chest wall movement, Good air movement bilaterally, CTAB RRR,No Gallops, Rubs or new Murmurs, No Parasternal Heave +ve B.Sounds, Abd Soft, No tenderness, No organomegaly appriciated, No rebound - guarding or rigidity. No Cyanosis, Clubbing or edema, No new Rash or bruise       Data Review:    CBC Recent Labs  Lab 07/12/20 0947 07/12/20 0954 07/13/20 0412 07/14/20 0232 07/15/20 0251 07/16/20 0146 07/17/20 0152  WBC 5.4  --  5.3 7.9 6.0 4.6 5.8  HGB 10.8*   < > 11.4* 12.2 11.7* 10.7* 11.0*  HCT 35.4*   < > 34.8* 38.4 35.9* 32.6* 33.6*  PLT 123*  --  92*  112* 104* 113* 136*  MCV 95.2  --  89.5 90.8 90.0 91.6 89.4  MCH 29.0  --  29.3 28.8 29.3 30.1 29.3  MCHC 30.5  --  32.8 31.8 32.6 32.8 32.7  RDW 18.2*  --  17.6* 17.9* 17.5* 17.6* 17.2*  LYMPHSABS 0.4*  --   --   --   --  0.8 0.6*  MONOABS 0.3  --   --   --   --  0.7 0.8  EOSABS 0.0  --   --   --   --  0.0 0.0  BASOSABS 0.1  --   --   --   --  0.0 0.0   < > = values in this interval not displayed.    Recent Labs  Lab 07/12/20 0947 07/12/20 0954 07/12/20 2308 07/13/20 0412 07/14/20 0232 07/15/20 0251 07/16/20 0146 07/17/20 0152  NA 137   < > 139 137 141 138 138 138  K 3.9   < > 3.9 3.8 4.3 3.9 3.0* 3.6  CL 99   < > 105 105 105 101 100 98  CO2 20*  --  19* 17* 16* 21* 24 23  GLUCOSE 154*   < > 143* 155* 122* 89 86 221*  BUN 72*   < > 81* 84* 90* 41* 24* 37*  CREATININE 5.34*   < > 5.59* 5.55* 6.73* 4.79* 4.12* 5.93*  CALCIUM 8.1*  --  7.7* 7.6* 8.0* 7.7* 7.5* 8.3*  AST 51*  --   --   --   --  75* 50* 45*  ALT 25  --   --   --   --  24 22 23   ALKPHOS 51  --   --   --   --  51 49 72  BILITOT 0.6  --   --   --   --  0.8 0.9 0.7  ALBUMIN 2.7*  --   --   --  2.4* 2.2* 2.5* 2.8*  MG  --   --  1.6*  --  2.2 1.9 1.9 2.1  CRP  --   --   --   --   --   --  12.5* 12.1*  DDIMER  --   --   --   --   --   --  1.54* 1.86*  INR 1.1  --   --   --   --   --   --   --   TSH  --   --   --   --   --    --  1.219  --   HGBA1C  --   --   --  7.9*  --   --   --   --   BNP  --   --   --   --   --   --  325.5* 226.6*   < > = values in this interval not displayed.    ------------------------------------------------------------------------------------------------------------------ No results for input(s): CHOL, HDL, LDLCALC, TRIG, CHOLHDL, LDLDIRECT in the last 72 hours.  Lab Results  Component Value Date   HGBA1C 7.9 (H) 07/13/2020   ------------------------------------------------------------------------------------------------------------------ Recent Labs    07/16/20 0146  TSH 1.219    Cardiac Enzymes No results for input(s): CKMB, TROPONINI, MYOGLOBIN in the last 168 hours.  Invalid input(s): CK ------------------------------------------------------------------------------------------------------------------    Component Value Date/Time   BNP 226.6 (H) 07/17/2020 9811    Micro Results Recent Results (from the past 240 hour(s))  MRSA  PCR Screening     Status: Abnormal   Collection Time: 07/12/20  6:37 PM   Specimen: Nasal Mucosa; Nasopharyngeal  Result Value Ref Range Status   MRSA by PCR POSITIVE (A) NEGATIVE Final    Comment:        The GeneXpert MRSA Assay (FDA approved for NASAL specimens only), is one component of a comprehensive MRSA colonization surveillance program. It is not intended to diagnose MRSA infection nor to guide or monitor treatment for MRSA infections. RESULT CALLED TO, READ BACK BY AND VERIFIED WITH: J. Dominica Severin 2139 07/12/2020 Mena Goes Performed at [facility], [employer] [address], [place], [place] [address]     Radiology Reports DG Chest 1 View  Result Date: 07/12/2020 CLINICAL DATA:  Patient with history of COVID-19. History of GI bleed. EXAM: CHEST  1 VIEW COMPARISON:  Chest radiograph 07/10/2020. FINDINGS: Stable right-sided dialysis catheter. Monitoring leads overlie the patient. Stable cardiomegaly. Aortic atherosclerosis. Site  oval increased patchy opacities left mid lower lung. No definite pleural effusion or pneumothorax. Thoracic spine degenerative changes. Surgical clips right axilla. IMPRESSION: Increasing patchy opacities left mid and lower lung may represent infection in the appropriate clinical setting or potentially atelectasis. Electronically Signed   By: Lovey Newcomer M.D.   On: 07/12/2020 11:24   CT HEAD WO CONTRAST  Result Date: 07/16/2020 CLINICAL DATA:  Stroke follow-up. EXAM: CT HEAD WITHOUT CONTRAST TECHNIQUE: Contiguous axial images were obtained from the base of the skull through the vertex without intravenous contrast. COMPARISON:  07/13/2020 head CT and MRI FINDINGS: Brain: Approximately 1 cm hyperdense focus at the posterior aspect of the right lentiform nucleus corresponding to the previously demonstrated hemorrhage is unchanged in size. There is no associated edema. No new intracranial hemorrhage, acute large territory infarct, mass, midline shift, or extra-axial fluid collection is identified. Mild cerebral atrophy is within normal limits for age. Hypodensities in the cerebral white matter bilaterally are unchanged and nonspecific but compatible with mild to moderate chronic small vessel ischemic disease. Vascular: Calcified atherosclerosis at the skull base. Skull: No fracture or suspicious osseous lesion. Sinuses/Orbits: Moderate mucosal thickening in the frontal sinuses. Small right mastoid effusion. Bilateral cataract extraction. Other: None. IMPRESSION: 1. Unchanged 1 cm right basal ganglia hemorrhage. 2. No new intracranial abnormality. Electronically Signed   By: Logan Bores M.D.   On: 07/16/2020 15:15   CT HEAD WO CONTRAST  Result Date: 07/13/2020 CLINICAL DATA:  Follow-up examination of basal ganglia hemorrhage. EXAM: CT HEAD WITHOUT CONTRAST TECHNIQUE: Contiguous axial images were obtained from the base of the skull through the vertex without intravenous contrast. COMPARISON:  Prior CT from  07/12/2020 FINDINGS: Brain: Previously identified intraparenchymal hemorrhage at the posterior right lentiform nucleus again seen. Bleed has mildly dispersed and is less dense in appearance as compared to previous exam, measuring overall similar in size at 10 x 7 x 10 mm, previously 10 x 5 x 10 mm when measured in a similar fashion. No significant surrounding edema or regional mass effect. No other acute or interval intracranial hemorrhage. No other acute large vessel territory infarct. No mass lesion, mass effect, or midline shift. No hydrocephalus or extra-axial fluid collection. Atrophy with chronic microvascular ischemic disease again noted. Vascular: No hyperdense vessel. Calcified atherosclerosis at the skull base. Skull: Scalp soft tissues and calvarium within normal limits. Sinuses/Orbits: Globes and orbital soft tissues demonstrate no acute finding. Mild mucoperiosteal thickening noted throughout the visualized paranasal sinuses. Mastoid air cells remain clear. Other: None. IMPRESSION: 1. Normal expected  interval evolution of acute intraparenchymal hemorrhage positioned at the posterior right lentiform nucleus, overall similar in size but with slightly decreased density as compared to previous. No significant surrounding edema or regional mass effect. 2. No other new acute intracranial abnormality. 3. Atrophy with chronic microvascular ischemic disease. Electronically Signed   By: Jeannine Boga M.D.   On: 07/13/2020 18:45   CT PELVIS WO CONTRAST  Result Date: 07/14/2020 CLINICAL DATA:  Right hip pain EXAM: CT PELVIS WITHOUT CONTRAST TECHNIQUE: Multidetector CT imaging of the pelvis was performed following the standard protocol without intravenous contrast. COMPARISON:  Radiographs 07/12/2020 FINDINGS: Urinary Tract: Bladder and distal ureters obscured by streak artifact from the patient's bilateral hip hardware. Bowel:  Unremarkable, rectum partially obscured by streak artifact.  Vascular/Lymphatic: Aortoiliac atherosclerotic vascular disease. No pathologic adenopathy identified. Reproductive:  Unremarkable Other:  No supplemental non-categorized findings. Musculoskeletal: Right hip screw with IM nail. Single distal interlocking screw along the IM nail. Inter trochanteric deformity from old fracture, no acute fracture is identified. Severe degenerative right hip arthropathy with loss of articular space and spurring. Left hip hemiarthroplasty without discrete complicating feature identified. Mild spurring along the SI joints. There is lower lumbar spondylosis and degenerative disc disease leading to moderate left foraminal stenosis at L5-S1. IMPRESSION: 1. Severe degenerative right hip arthropathy. 2. Right hip screw with IM nail. Right intertrochanteric deformity from old fracture. No acute fracture is identified. 3. Left hip hemiarthroplasty without discrete complicating feature identified. 4. Lower lumbar spondylosis and degenerative disc disease leading to moderate left foraminal stenosis at L5-S1. 5. Aortic atherosclerosis. Aortic Atherosclerosis (ICD10-I70.0). Electronically Signed   By: Van Clines M.D.   On: 07/14/2020 14:12   MR ANGIO HEAD WO CONTRAST  Result Date: 07/13/2020 CLINICAL DATA:  Follow-up examination for acute stroke. EXAM: MRI HEAD WITHOUT CONTRAST MRA HEAD WITHOUT CONTRAST TECHNIQUE: Multiplanar, multiecho pulse sequences of the brain and surrounding structures were obtained without intravenous contrast. Angiographic images of the head were obtained using MRA technique without contrast. COMPARISON:  Prior head CT from 07/12/2020 as well as previous brain MRI from 08/17/2016. FINDINGS: MRI HEAD FINDINGS Brain: Diffuse prominence of the CSF containing spaces compatible with generalized age-related cerebral atrophy. Patchy and confluent T2/FLAIR hyperintensity within the periventricular and deep white matter both cerebral hemispheres most likely related  chronic microvascular ischemic disease. Patchy involvement of the pons. Overall, these changes are mildly progressed as compared to 2018. Few small remote lacunar type infarcts noted about the basal ganglia, most notable which present at the right caudothalamic groove. Previously identified hemorrhage centered at the posterior right lentiform nucleus/right internal capsule again seen, stable in size measuring up to approximately 1 cm in greatest dimension. This is somewhat difficult to visualized by MR, and is most notable on T1 weighted sequence (series 16, image 26). No significant surrounding edema or regional mass effect. No other acute intracranial hemorrhage. Few scattered chronic micro hemorrhages noted at the right cerebellum and pons, likely small vessel/hypertensive in nature. Single punctate 4 mm focus of diffusion abnormality involving the subcortical white matter of the right parietal corona radiata noted, likely a small acute to subacute small vessel type ischemic infarct (series 5, image 74). No associated hemorrhage or mass effect. No other diffusion abnormality to suggest acute or subacute ischemia. Gray-white matter differentiation otherwise maintained. No mass lesion, mass effect, or midline shift. No hydrocephalus or extra-axial fluid collection. Pituitary gland suprasellar region normal. Midline structures intact. 1.3 cm ovoid well-circumscribed lesion positioned at the left petrous apex demonstrating  heterogeneous T2/FLAIR signal intensity with hyperintense T1 signal intensity noted (series 16, image 14), nonspecific, but could reflect a small cholesterol granuloma versus asymmetric fatty marrow. Finding is relatively stable from previous MRI from 2018, and of doubtful significance. Vascular: Major intracranial vascular flow voids are maintained. Skull and upper cervical spine: Craniocervical junction within normal limits. Multilevel degenerative spondylosis noted within the visualized upper  cervical spine with resultant mild-to-moderate spinal stenosis. Bone marrow signal intensity within normal limits. No scalp soft tissue abnormality. Sinuses/Orbits: Patient status post bilateral ocular lens replacement. Globes and orbital soft tissues demonstrate no acute finding. Mild scattered mucosal thickening noted throughout the paranasal sinuses. Trace bilateral mastoid effusions, of doubtful significance. Visualized nasopharynx within normal limits. Other: None. MRA HEAD FINDINGS ANTERIOR CIRCULATION: Examination degraded by motion artifact. Visualized distal cervical segments of the internal carotid arteries are patent with symmetric antegrade flow. Petrous segments widely patent. Atheromatous irregularity throughout the carotid siphons with associated mild multifocal narrowing. A1 segments patent bilaterally. Normal anterior communicating artery complex. Anterior cerebral arteries patent to their distal aspects without high-grade stenosis. Short-segment severe stenosis at the distal left M1 segment/left MCA bifurcation (series 1036, image 12). Left MCA branches are diffusely irregular but perfused distally. Right M1 segment patent proximally. Focal signal cutoff at the mid-distal right M1 segment into the bifurcation (series 1036, image 11). This likely reflects a severe high-grade stenosis, as the right MCA branches are perfused distally. Diffuse atheromatous irregularity seen throughout the right MCA branches as well. Proximally. POSTERIOR CIRCULATION: Vertebral arteries largely code dominant and patent to the vertebrobasilar junction. Both PICA origins patent and normal. Basilar patent to its distal aspect without stenosis. Superior cerebral arteries patent bilaterally. Both PCAs primarily supplied via the basilar. Severe long segment stenosis involving the mid-distal right P2 segment (series 1046, image 15). Severe multifocal distal left P2 and P3 stenoses present as well (series 1046, image 14). PCAs  remain perfused to their distal aspects. No intracranial aneurysm or other vascular abnormality. IMPRESSION: MRI HEAD IMPRESSION: 1. Unchanged 1 cm hemorrhage centered at the posterior right lentiform nucleus/right internal capsule. No significant surrounding edema or regional mass effect. 2. 4 mm focus of diffusion abnormality involving the subcortical white matter of the right parietal corona radiata, consistent with a small acute to subacute small vessel type ischemic infarct. No associated hemorrhage. 3. Underlying age-related cerebral atrophy with moderate chronic microvascular ischemic disease, mildly progressed as compared to 2018. MRA HEAD IMPRESSION: 1. Technically limited exam due to motion artifact. 2. Negative intracranial MRA for large vessel occlusion. 3. Focal severe high-grade stenoses involving the mid-distal right M1 segment and distal left M1 segment/left MCA bifurcation. Left MCA branches remain perfused distally. 4. Severe multifocal bilateral P2 and distal left P3 stenoses as above. Electronically Signed   By: Jeannine Boga M.D.   On: 07/13/2020 19:16   MR BRAIN WO CONTRAST  Result Date: 07/13/2020 CLINICAL DATA:  Follow-up examination for acute stroke. EXAM: MRI HEAD WITHOUT CONTRAST MRA HEAD WITHOUT CONTRAST TECHNIQUE: Multiplanar, multiecho pulse sequences of the brain and surrounding structures were obtained without intravenous contrast. Angiographic images of the head were obtained using MRA technique without contrast. COMPARISON:  Prior head CT from 07/12/2020 as well as previous brain MRI from 08/17/2016. FINDINGS: MRI HEAD FINDINGS Brain: Diffuse prominence of the CSF containing spaces compatible with generalized age-related cerebral atrophy. Patchy and confluent T2/FLAIR hyperintensity within the periventricular and deep white matter both cerebral hemispheres most likely related chronic microvascular ischemic disease. Patchy involvement of the pons.  Overall, these changes  are mildly progressed as compared to 2018. Few small remote lacunar type infarcts noted about the basal ganglia, most notable which present at the right caudothalamic groove. Previously identified hemorrhage centered at the posterior right lentiform nucleus/right internal capsule again seen, stable in size measuring up to approximately 1 cm in greatest dimension. This is somewhat difficult to visualized by MR, and is most notable on T1 weighted sequence (series 16, image 26). No significant surrounding edema or regional mass effect. No other acute intracranial hemorrhage. Few scattered chronic micro hemorrhages noted at the right cerebellum and pons, likely small vessel/hypertensive in nature. Single punctate 4 mm focus of diffusion abnormality involving the subcortical white matter of the right parietal corona radiata noted, likely a small acute to subacute small vessel type ischemic infarct (series 5, image 74). No associated hemorrhage or mass effect. No other diffusion abnormality to suggest acute or subacute ischemia. Gray-white matter differentiation otherwise maintained. No mass lesion, mass effect, or midline shift. No hydrocephalus or extra-axial fluid collection. Pituitary gland suprasellar region normal. Midline structures intact. 1.3 cm ovoid well-circumscribed lesion positioned at the left petrous apex demonstrating heterogeneous T2/FLAIR signal intensity with hyperintense T1 signal intensity noted (series 16, image 14), nonspecific, but could reflect a small cholesterol granuloma versus asymmetric fatty marrow. Finding is relatively stable from previous MRI from 2018, and of doubtful significance. Vascular: Major intracranial vascular flow voids are maintained. Skull and upper cervical spine: Craniocervical junction within normal limits. Multilevel degenerative spondylosis noted within the visualized upper cervical spine with resultant mild-to-moderate spinal stenosis. Bone marrow signal intensity  within normal limits. No scalp soft tissue abnormality. Sinuses/Orbits: Patient status post bilateral ocular lens replacement. Globes and orbital soft tissues demonstrate no acute finding. Mild scattered mucosal thickening noted throughout the paranasal sinuses. Trace bilateral mastoid effusions, of doubtful significance. Visualized nasopharynx within normal limits. Other: None. MRA HEAD FINDINGS ANTERIOR CIRCULATION: Examination degraded by motion artifact. Visualized distal cervical segments of the internal carotid arteries are patent with symmetric antegrade flow. Petrous segments widely patent. Atheromatous irregularity throughout the carotid siphons with associated mild multifocal narrowing. A1 segments patent bilaterally. Normal anterior communicating artery complex. Anterior cerebral arteries patent to their distal aspects without high-grade stenosis. Short-segment severe stenosis at the distal left M1 segment/left MCA bifurcation (series 1036, image 12). Left MCA branches are diffusely irregular but perfused distally. Right M1 segment patent proximally. Focal signal cutoff at the mid-distal right M1 segment into the bifurcation (series 1036, image 11). This likely reflects a severe high-grade stenosis, as the right MCA branches are perfused distally. Diffuse atheromatous irregularity seen throughout the right MCA branches as well. Proximally. POSTERIOR CIRCULATION: Vertebral arteries largely code dominant and patent to the vertebrobasilar junction. Both PICA origins patent and normal. Basilar patent to its distal aspect without stenosis. Superior cerebral arteries patent bilaterally. Both PCAs primarily supplied via the basilar. Severe long segment stenosis involving the mid-distal right P2 segment (series 1046, image 15). Severe multifocal distal left P2 and P3 stenoses present as well (series 1046, image 14). PCAs remain perfused to their distal aspects. No intracranial aneurysm or other vascular  abnormality. IMPRESSION: MRI HEAD IMPRESSION: 1. Unchanged 1 cm hemorrhage centered at the posterior right lentiform nucleus/right internal capsule. No significant surrounding edema or regional mass effect. 2. 4 mm focus of diffusion abnormality involving the subcortical white matter of the right parietal corona radiata, consistent with a small acute to subacute small vessel type ischemic infarct. No associated hemorrhage. 3. Underlying age-related cerebral  atrophy with moderate chronic microvascular ischemic disease, mildly progressed as compared to 2018. MRA HEAD IMPRESSION: 1. Technically limited exam due to motion artifact. 2. Negative intracranial MRA for large vessel occlusion. 3. Focal severe high-grade stenoses involving the mid-distal right M1 segment and distal left M1 segment/left MCA bifurcation. Left MCA branches remain perfused distally. 4. Severe multifocal bilateral P2 and distal left P3 stenoses as above. Electronically Signed   By: Jeannine Boga M.D.   On: 07/13/2020 19:16   DG Pelvis Portable  Result Date: 07/12/2020 CLINICAL DATA:  Right hip pain.  No reported injury. EXAM: PORTABLE PELVIS 1-2 VIEWS COMPARISON:  07/21/2018 outside pelvic radiographs FINDINGS: Partially visualized left hip hemiarthroplasty and partially visualized fixation hardware in the proximal right femur with no evidence of hardware fracture or loosening. No evidence of hip dislocation on this single frontal view. No osseous fracture. No focal osseous lesions. Moderate right hip osteoarthritis. No suspicious focal osseous lesions. No pelvic diastasis. IMPRESSION: No acute osseous abnormality. No evidence of hip dislocation on this single frontal view. Moderate right hip osteoarthritis. Partially visualized left hip hemiarthroplasty and proximal right femur fixation hardware, with no evidence of hardware complication. Electronically Signed   By: Ilona Sorrel M.D.   On: 07/12/2020 12:33   DG CHEST PORT 1  VIEW  Result Date: 07/15/2020 CLINICAL DATA:  COVID-19. EXAM: PORTABLE CHEST 1 VIEW COMPARISON:  07/12/2020. FINDINGS: Right dual-lumen catheter stable position. Stable cardiomegaly. Progressive bilateral pulmonary infiltrates/edema. Tiny bilateral pleural effusions cannot be excluded. No pneumothorax. Degenerative change thoracic spine. Surgical clips right chest. IMPRESSION: 1. Right dual-lumen catheter in stable position. 2. Stable cardiomegaly. 3. Progressive bilateral pulmonary infiltrates/edema. Tiny bilateral pleural effusions cannot be excluded. Electronically Signed   By: Marcello Moores  Register   On: 07/15/2020 05:24   DG Abd Portable 1V  Result Date: 07/13/2020 CLINICAL DATA:  Evaluate orogastric tube EXAM: PORTABLE ABDOMEN - 1 VIEW COMPARISON:  July 13, 2020 FINDINGS: The OG tube side port and distal tip are in the region the stomach. IMPRESSION: The OG tube is in good position. Electronically Signed   By: Dorise Bullion III M.D   On: 07/13/2020 11:04   DG Abd Portable 1V  Result Date: 07/13/2020 CLINICAL DATA:  Vomiting. EXAM: PORTABLE ABDOMEN - 1 VIEW COMPARISON:  None. FINDINGS: No evidence of dilated bowel loops. Aortic atherosclerotic calcification noted. Compression screw noted in the right hip. Left hip prosthesis is also seen. IMPRESSION: Unremarkable bowel gas pattern.  No acute findings. Electronically Signed   By: Marlaine Hind M.D.   On: 07/13/2020 05:08   ECHOCARDIOGRAM COMPLETE  Result Date: 07/14/2020    ECHOCARDIOGRAM REPORT   Patient Name:   TAMECKA MILHAM Date of Exam: 07/14/2020 Medical Rec #:  [id number]     Height:       64.0 in Accession #:    [id number]    Weight:       174.6 lb Date of Birth:  1940-09-18      BSA:          1.847 m Patient Age:    72 years      BP:           114/75 mmHg Patient Gender: F             HR:           77 bpm. Exam Location:  Inpatient Procedure: 2D Echo, Color Doppler and Cardiac Doppler Indications:    Stroke 434.91 / I163.9  History:  Patient has no prior history of Echocardiogram examinations.                 Risk Factors:Dyslipidemia and Diabetes.  Sonographer:    Bernadene Person RDCS Referring Phys: [place]  1. Left ventricular ejection fraction, by estimation, is 60 to 65%. The left ventricle has normal function. The left ventricle has no regional wall motion abnormalities. There is moderate left ventricular hypertrophy. Left ventricular diastolic parameters are consistent with Grade I diastolic dysfunction (impaired relaxation). Elevated left ventricular end-diastolic pressure. The E/e' is 66.  2. Right ventricular systolic function is normal. The right ventricular size is normal.  3. The pericardial effusion is posterior to the left ventricle.  4. The mitral valve is abnormal. Mild mitral valve regurgitation. Moderate mitral annular calcification.  5. The aortic valve is tricuspid. Aortic valve regurgitation is not visualized. Mild aortic valve sclerosis is present, with no evidence of aortic valve stenosis. Comparison(s): No prior Echocardiogram. FINDINGS  Left Ventricle: Left ventricular ejection fraction, by estimation, is 60 to 65%. The left ventricle has normal function. The left ventricle has no regional wall motion abnormalities. The left ventricular internal cavity size was normal in size. There is  moderate left ventricular hypertrophy. Left ventricular diastolic parameters are consistent with Grade I diastolic dysfunction (impaired relaxation). Elevated left ventricular end-diastolic pressure. The E/e' is 20. Right Ventricle: The right ventricular size is normal. No increase in right ventricular wall thickness. Right ventricular systolic function is normal. Left Atrium: Left atrial size was normal in size. Right Atrium: Right atrial size was normal in size. Pericardium: Trivial pericardial effusion is present. The pericardial effusion is posterior to the left ventricle. Mitral Valve: The mitral valve is  abnormal. There is mild thickening of the mitral valve leaflet(s). Moderate mitral annular calcification. Mild mitral valve regurgitation, with posteriorly-directed jet. Tricuspid Valve: The tricuspid valve is grossly normal. Tricuspid valve regurgitation is trivial. Aortic Valve: The aortic valve is tricuspid. Aortic valve regurgitation is not visualized. Mild aortic valve sclerosis is present, with no evidence of aortic valve stenosis. Pulmonic Valve: The pulmonic valve was normal in structure. Pulmonic valve regurgitation is trivial. Aorta: The aortic root and ascending aorta are structurally normal, with no evidence of dilitation. Venous: The inferior vena cava was not well visualized. IAS/Shunts: No atrial level shunt detected by color flow Doppler.  LEFT VENTRICLE PLAX 2D LVIDd:         4.30 cm  Diastology LVIDs:         2.80 cm  LV e' medial:    5.66 cm/s LV PW:         1.20 cm  LV E/e' medial:  21.9 LV IVS:        1.20 cm  LV e' lateral:   6.20 cm/s LVOT diam:     2.10 cm  LV E/e' lateral: 20.0 LV SV:         103 LV SV Index:   56 LVOT Area:     3.46 cm  RIGHT VENTRICLE RV S prime:     13.90 cm/s TAPSE (M-mode): 1.8 cm LEFT ATRIUM             Index       RIGHT ATRIUM           Index LA diam:        3.30 cm 1.79 cm/m  RA Area:     13.60 cm LA Vol (A2C):   51.0 ml 27.62 ml/m RA  Volume:   28.50 ml  15.43 ml/m LA Vol (A4C):   50.1 ml 27.13 ml/m LA Biplane Vol: 51.9 ml 28.10 ml/m  AORTIC VALVE LVOT Vmax:   143.00 cm/s LVOT Vmean:  98.200 cm/s LVOT VTI:    0.296 m  AORTA Ao Root diam: 3.00 cm Ao Asc diam:  3.40 cm MITRAL VALVE                TRICUSPID VALVE MV Area (PHT): 3.21 cm     TR Peak grad:   23.2 mmHg MV Decel Time: 236 msec     TR Vmax:        241.00 cm/s MV E velocity: 124.00 cm/s MV A velocity: 120.00 cm/s  SHUNTS MV E/A ratio:  1.03         Systemic VTI:  0.30 m                             Systemic Diam: 2.10 cm Lyman Bishop MD Electronically signed by Lyman Bishop MD Signature Date/Time:  07/14/2020/2:10:04 PM    Final    CT HEAD CODE STROKE WO CONTRAST  Addendum Date: 07/12/2020   ADDENDUM REPORT: 07/12/2020 10:16 ADDENDUM: Correction.  Findings were discussed with Dr. Theda Sers. Electronically Signed   By: Margaretha Sheffield MD   On: 07/12/2020 10:16   Result Date: 07/12/2020 CLINICAL DATA:  Code stroke.  Neuro deficit, acute stroke suspected. EXAM: CT HEAD WITHOUT CONTRAST TECHNIQUE: Contiguous axial images were obtained from the base of the skull through the vertex without intravenous contrast. COMPARISON:  MRI head August 17, 2016 FINDINGS: Brain: Acute hemorrhage in the right basal ganglia, centered in the region of the posterior putamen/posterior limb of the internal capsule. The hemorrhage measures approximately 7 x 4 by 6 mm. No evidence of acute large vascular territory infarct. Patchy white matter hypoattenuation, which is nonspecific but most likely related to chronic microvascular ischemic disease. Mild generalized atrophy. No hydrocephalus. No evidence of a mass lesion. No midline shift. Remote right cerebellar lacunar infarct. Vascular: Calcific atherosclerosis. Skull: No acute fracture. Sinuses/Orbits: Sinuses are clear.  Unremarkable orbits. Other: No mastoid effusion. IMPRESSION: 1. Acute 7 mm hemorrhage within the right basal ganglia. No substantial mass effect. 2. Chronic microvascular ischemic disease and remote right cerebellar lacunar infarct. Code stroke imaging results were communicated on 07/12/2020 at 10:00 am to provider Dr. Lorrin Goodell via telephone, who verbally acknowledged these results. Electronically Signed: By: Margaretha Sheffield MD On: 07/12/2020 10:05   VAS US CAROTID  Result Date: 07/14/2020 Carotid Arterial Duplex Study Indications:       CVA. Risk Factors:      Hyperlipidemia, Diabetes. Limitations        Today's exam was limited due to the patient's inability or                    unwillingness to cooperate. Comparison Study:  no prior Performing  Technologist: Abram Sander RVS  Examination Guidelines: A complete evaluation includes B-mode imaging, spectral Doppler, color Doppler, and power Doppler as needed of all accessible portions of each vessel. Bilateral testing is considered an integral part of a complete examination. Limited examinations for reoccurring indications may be performed as noted.  Right Carotid Findings: +----------+--------+--------+--------+------------------+--------------+           PSV cm/sEDV cm/sStenosisPlaque DescriptionComments       +----------+--------+--------+--------+------------------+--------------+ CCA Prox  54      9  heterogenous                     +----------+--------+--------+--------+------------------+--------------+ CCA Distal65      16              heterogenous                     +----------+--------+--------+--------+------------------+--------------+ ICA Prox  48      17      1-39%   heterogenous                     +----------+--------+--------+--------+------------------+--------------+ ICA Distal                                          Not visualized +----------+--------+--------+--------+------------------+--------------+ ECA       91      9                                                +----------+--------+--------+--------+------------------+--------------+ +----------+--------+-------+--------------------------+-------------------+           PSV cm/sEDV cmsDescribe                  Arm Pressure (mmHG) +----------+--------+-------+--------------------------+-------------------+ Subclavian               not visualized due to port                    +----------+--------+-------+--------------------------+-------------------+ +---------+--------+--------+--------------+ VertebralPSV cm/sEDV cm/sNot identified +---------+--------+--------+--------------+  Left Carotid Findings:  +----------+--------+--------+--------+------------------+--------------+           PSV cm/sEDV cm/sStenosisPlaque DescriptionComments       +----------+--------+--------+--------+------------------+--------------+ CCA Prox  53      5               heterogenous                     +----------+--------+--------+--------+------------------+--------------+ CCA Distal73      10              heterogenous                     +----------+--------+--------+--------+------------------+--------------+ ICA Prox  127     16      1-39%   heterogenous                     +----------+--------+--------+--------+------------------+--------------+ ICA Distal                                          Not visualized +----------+--------+--------+--------+------------------+--------------+ ECA       220                                                      +----------+--------+--------+--------+------------------+--------------+ +----------+--------+--------+--------------+-------------------+           PSV cm/sEDV cm/sDescribe      Arm Pressure (mmHG) +----------+--------+--------+--------------+-------------------+ Subclavian                Not identified                    +----------+--------+--------+--------------+-------------------+ +---------+--------+--------+--------------+  Natalia cm/sEDV cm/sNot identified +---------+--------+--------+--------------+   Summary: Right Carotid: Velocities in the right ICA are consistent with a 1-39% stenosis. Left Carotid: Velocities in the left ICA are consistent with a 1-39% stenosis. Vertebrals: Bilateral vertebral arteries were not visualized. *See table(s) above for measurements and observations.  Electronically signed by Antony Contras MD on 07/14/2020 at 1:04:27 PM.    Final

## 2020-07-17 NOTE — Anesthesia Postprocedure Evaluation (Signed)
Anesthesia Post Note  Patient: [name]  Procedure(s) Performed: ESOPHAGOGASTRODUODENOSCOPY (EGD) WITH PROPOFOL (N/A ) BIOPSY     Patient location during evaluation: PACU Anesthesia Type: General Level of consciousness: sedated Pain management: pain level controlled Vital Signs Assessment: post-procedure vital signs reviewed and stable Respiratory status: spontaneous breathing and respiratory function stable Cardiovascular status: stable Postop Assessment: no apparent nausea or vomiting Anesthetic complications: no   No complications documented.  Last Vitals:  Vitals:   07/17/20 1540 07/17/20 1558  BP: 134/66 (!) 149/70  Pulse: 87 84  Resp: (!) 22 19  Temp:  36.9 C  SpO2: 98% 98%    Last Pain:  Vitals:   07/17/20 1558  TempSrc: Oral  PainSc:                  [place]

## 2020-07-17 NOTE — Progress Notes (Signed)
Patient's MEWS turned yellow. Patient is off the unit in Endo.

## 2020-07-18 LAB — GLUCOSE, CAPILLARY
Glucose-Capillary: 115 mg/dL — ABNORMAL HIGH (ref 70–99)
Glucose-Capillary: 138 mg/dL — ABNORMAL HIGH (ref 70–99)
Glucose-Capillary: 138 mg/dL — ABNORMAL HIGH (ref 70–99)
Glucose-Capillary: 155 mg/dL — ABNORMAL HIGH (ref 70–99)
Glucose-Capillary: 155 mg/dL — ABNORMAL HIGH (ref 70–99)
Glucose-Capillary: 167 mg/dL — ABNORMAL HIGH (ref 70–99)

## 2020-07-18 LAB — CBC WITH DIFFERENTIAL/PLATELET
Abs Immature Granulocytes: 0.17 10*3/uL — ABNORMAL HIGH (ref 0.00–0.07)
Basophils Absolute: 0 10*3/uL (ref 0.0–0.1)
Basophils Relative: 0 %
Eosinophils Absolute: 0 10*3/uL (ref 0.0–0.5)
Eosinophils Relative: 0 %
HCT: 37.7 % (ref 36.0–46.0)
Hemoglobin: 12 g/dL (ref 12.0–15.0)
Immature Granulocytes: 3 %
Lymphocytes Relative: 13 %
Lymphs Abs: 0.7 10*3/uL (ref 0.7–4.0)
MCH: 28.9 pg (ref 26.0–34.0)
MCHC: 31.8 g/dL (ref 30.0–36.0)
MCV: 90.8 fL (ref 80.0–100.0)
Monocytes Absolute: 1.1 10*3/uL — ABNORMAL HIGH (ref 0.1–1.0)
Monocytes Relative: 22 %
Neutro Abs: 3.1 10*3/uL (ref 1.7–7.7)
Neutrophils Relative %: 62 %
Platelets: 130 10*3/uL — ABNORMAL LOW (ref 150–400)
RBC: 4.15 MIL/uL (ref 3.87–5.11)
RDW: 17.2 % — ABNORMAL HIGH (ref 11.5–15.5)
WBC: 5.1 10*3/uL (ref 4.0–10.5)
nRBC: 0 % (ref 0.0–0.2)

## 2020-07-18 LAB — COMPREHENSIVE METABOLIC PANEL
ALT: 28 U/L (ref 0–44)
AST: 49 U/L — ABNORMAL HIGH (ref 15–41)
Albumin: 2.8 g/dL — ABNORMAL LOW (ref 3.5–5.0)
Alkaline Phosphatase: 79 U/L (ref 38–126)
Anion gap: 14 (ref 5–15)
BUN: 17 mg/dL (ref 8–23)
CO2: 25 mmol/L (ref 22–32)
Calcium: 8.6 mg/dL — ABNORMAL LOW (ref 8.9–10.3)
Chloride: 98 mmol/L (ref 98–111)
Creatinine, Ser: 4.37 mg/dL — ABNORMAL HIGH (ref 0.44–1.00)
GFR, Estimated: 10 mL/min — ABNORMAL LOW (ref >60)
Glucose, Bld: 140 mg/dL — ABNORMAL HIGH (ref 70–99)
Potassium: 3.9 mmol/L (ref 3.5–5.1)
Sodium: 137 mmol/L (ref 135–145)
Total Bilirubin: 0.9 mg/dL (ref 0.3–1.2)
Total Protein: 6.4 g/dL — ABNORMAL LOW (ref 6.5–8.1)

## 2020-07-18 LAB — HEPARIN LEVEL (UNFRACTIONATED): Heparin Unfractionated: <0.1 IU/mL — ABNORMAL LOW (ref 0.30–0.70)

## 2020-07-18 LAB — D-DIMER, QUANTITATIVE: D-Dimer, Quant: 2.21 ug/mL-FEU — ABNORMAL HIGH (ref 0.00–0.50)

## 2020-07-18 LAB — C-REACTIVE PROTEIN: CRP: 8.9 mg/dL — ABNORMAL HIGH (ref <1.0)

## 2020-07-18 LAB — MAGNESIUM: Magnesium: 1.8 mg/dL (ref 1.7–2.4)

## 2020-07-18 LAB — BRAIN NATRIURETIC PEPTIDE: B Natriuretic Peptide: 179.8 pg/mL — ABNORMAL HIGH (ref 0.0–100.0)

## 2020-07-18 MED ORDER — HEPARIN (PORCINE) 25000 UT/250ML-% IV SOLN
1100.0000 [IU]/h | INTRAVENOUS | Status: DC
  Administered 2020-07-18: 09:00:00 900 [IU]/h via INTRAVENOUS
  Filled 2020-07-18 (×2): qty 250

## 2020-07-18 NOTE — Progress Notes (Addendum)
ANTICOAGULATION CONSULT NOTE - Initial Consult  Pharmacy Consult for heparin Indication: atrial fibrillation in setting of ischemic infarct with hemorrhagic conversion and GI bleed  No Known Allergies  Patient Measurements: Height: 5\' 4"  (162.6 cm) Weight: 74.6 kg (164 lb 7.4 oz) IBW/kg (Calculated) : 54.7 Heparin Dosing Weight: 70kg  Vital Signs: Temp: 98 F (36.7 C) (12/24 1143) Temp Source: Oral (12/24 1143) BP: 124/71 (12/24 1143) Pulse Rate: 86 (12/24 1143)  Labs: Recent Labs    07/16/20 0146 07/17/20 0152 07/18/20 0050 07/18/20 1546  HGB 10.7* 11.0* 12.0  --   HCT 32.6* 33.6* 37.7  --   PLT 113* 136* 130*  --   HEPARINUNFRC  --   --   --  <0.10*  CREATININE 4.12* 5.93* 4.37*  --     Estimated Creatinine Clearance: 10.5 mL/min (A) (by C-G formula based on SCr of 4.37 mg/dL (H)).   Medical History: Past Medical History:  Diagnosis Date  . Cancer ([place])   . Diabetes mellitus   . Hyperlipidemia   . Hypertension    pt states at times    Assessment: 79yo female admitted with GIB, on EMS arrival to pt's home they noted facial droop and left-sided weakness >> found to have acute ischemic infarct with hemorrhagic conversion, now w/ resolved GIB, pharmacy consulted to dose heparin. Pt had no anticoagulants reported PTA.  Spoke with RN who reported no issues with the IV line and no s/sx of bleeding from patient  CBC: Hgb 12.0, Plt 130 Heparin Level: <0.10 (subtherapeutic)  Goal of Therapy:  Heparin level 0.3-0.5 units/ml Monitor platelets by anticoagulation protocol: Yes   Plan:  Increase heparin infusion to 1050 units/hr 8hr heparin level at 0200 Monitor CBC, heparin level, and s/sx bleeding  Follow-up longterm anticoagulation plans with potential transition to oral anticoagulant  Wilson Singer, PharmD PGY1 Pharmacy Resident 07/18/2020 5:37 PM

## 2020-07-18 NOTE — Progress Notes (Signed)
Patient ID: [name], female   DOB: 07-26-1941, 79 y.o.   MRN: [id number] [facility] Progress Note   Assessment/ Plan:   1.  COVID-19 infection: This was diagnosed on 12/13 and she underwent monoclonal antibody treatment on 12/17.  She was unvaccinated.  Clinically appears to be stable and without any evidence of pneumonia/hypoxia. 2.  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 3. ESRD: She is usually on a Monday/Wednesday/Friday dialysis schedule and was earlier off schedule.  Plans in place for hemodialysis today.  This will be done in Covid isolation. 4.  GI bleed with acute blood loss anemia: Status post PRBC transfusion earlier with improvement of hemoglobin and hemodynamic status.  Underwent EGD yesterday showing gastritis and duodenitis without any focal bleeding.  Plans noted for medical management with PPI. 5. CKD-MBD: Elevated phosphorus level noted, will continue to trend this during hospitalization.  Continue binders/VDRA. 6. Nutrition: Resumed renal diet with fluid restriction 7.  Paroxysmal atrial fibrillation with rapid ventricular response: In sinus rhythm when seen, plans noted for transition to Eliquis from heparin drip when clinically stable from GI bleed standpoint.  Subjective:   Denies any chest pain or shortness of breath at this time.  Had some soreness in her throat earlier that is improving.   Objective:   BP 124/71 (BP Location: Left Arm)   Pulse 86   Temp 98 F (36.7 C) (Oral)   Resp 16   Ht 5\' 4"  (1.626 m)   Wt 74.6 kg   SpO2 95%   BMI 28.23 kg/m   Physical Exam: Gen: Comfortably resting in bed CVS: Pulse regular rhythm, normal rate, S1 and S2 normal Resp: Clear to auscultation bilaterally without  distinct rales or rhonchi.  Right IJ TDC Abd: Soft, flat, nontender, bowel sounds normal Ext: Trace lower extremity edema  Labs: BMET Recent Labs  Lab 07/12/20 2308 07/13/20 0412 07/14/20 0232 07/15/20 0251 07/16/20 0146 07/17/20 0152 07/18/20 0050  NA 139 137 141 138 138 138 137  K 3.9 3.8 4.3 3.9 3.0* 3.6 3.9  CL 105 105 105 101 100 98 98  CO2 19* 17* 16* 21* 24 23 25   GLUCOSE 143* 155* 122* 89 86 221* 140*  BUN 81* 84* 90* 41* 24* 37* 17  CREATININE 5.59* 5.55* 6.73* 4.79* 4.12* 5.93* 4.37*  CALCIUM 7.7* 7.6* 8.0* 7.7* 7.5* 8.3* 8.6*  PHOS  --   --  8.6*  --   --   --   --    CBC Recent Labs  Lab 07/12/20 0947 07/12/20 0954 07/15/20 0251 07/16/20 0146 07/17/20 0152 07/18/20 0050  WBC 5.4   < > 6.0 4.6 5.8 5.1  NEUTROABS 4.6  --   --  2.9 4.3 3.1  HGB 10.8*   < > 11.7* 10.7* 11.0* 12.0  HCT 35.4*   < > 35.9* 32.6* 33.6* 37.7  MCV 95.2   < > 90.0 91.6 89.4 90.8  PLT 123*   < > 104* 113* 136* 130*   < > = values in this interval not displayed.      Medications:    . amiodarone  200 mg Oral Daily  . aspirin EC  81 mg Oral Daily  . atorvastatin  20 mg Oral QHS  . chlorhexidine  15  mL Mouth Rinse BID  . Chlorhexidine Gluconate Cloth  6 each Topical Daily  . Chlorhexidine Gluconate Cloth  6 each Topical Q0600  . diltiazem  120 mg Oral Daily  . insulin aspart  0-6 Units Subcutaneous Q4H  . mouth rinse  15 mL Mouth Rinse q12n4p  . midodrine  10 mg Oral Once  . pantoprazole  40 mg Oral BID   Elmarie Shiley, MD 07/18/2020, 1:19 PM

## 2020-07-18 NOTE — Progress Notes (Signed)
ANTICOAGULATION CONSULT NOTE - Initial Consult  Pharmacy Consult for heparin Indication: atrial fibrillation in setting of ischemic infarct with hemorrhagic conversion and GI bleed  No Known Allergies  Patient Measurements: Height: 5\' 4"  (162.6 cm) Weight: 74.6 kg (164 lb 7.4 oz) IBW/kg (Calculated) : 54.7 Heparin Dosing Weight: 70kg  Vital Signs: Temp: 98.7 F (37.1 C) (12/24 0726) Temp Source: Oral (12/24 0726) BP: 108/80 (12/24 0726) Pulse Rate: 94 (12/24 0726)  Labs: Recent Labs    07/16/20 0146 07/17/20 0152 07/18/20 0050  HGB 10.7* 11.0* 12.0  HCT 32.6* 33.6* 37.7  PLT 113* 136* 130*  CREATININE 4.12* 5.93* 4.37*    Estimated Creatinine Clearance: 10.5 mL/min (A) (by C-G formula based on SCr of 4.37 mg/dL (H)).   Medical History: Past Medical History:  Diagnosis Date  . Cancer ([place])   . Diabetes mellitus   . Hyperlipidemia   . Hypertension    pt states at times    Medications:  Medications Prior to Admission  Medication Sig Dispense Refill Last Dose  . amLODipine (NORVASC) 10 MG tablet Take 10 mg by mouth daily.   07/11/2020 at am  . atorvastatin (LIPITOR) 20 MG tablet Take 20 mg by mouth at bedtime.   07/10/2020 at pm  . Calcium Carbonate (CALTRATE 600 PO) Take 2 tablets by mouth daily.   07/11/2020 at am  . Cholecalciferol (VITAMIN D) 2000 UNITS tablet Take 2,000 Units by mouth daily.   07/11/2020 at am  . glipiZIDE (GLUCOTROL XL) 2.5 MG 24 hr tablet Take 2.5 mg by mouth daily.   07/11/2020 at am  . hydrALAZINE (APRESOLINE) 25 MG tablet Take 1 tablet (25 mg total) by mouth 2 (two) times daily.   07/11/2020 at am  . Insulin Degludec-Liraglutide (XULTOPHY) 100-3.6 UNIT-MG/ML SOPN Inject 44 Units into the skin every morning.   07/11/2020 at am  . letrozole (FEMARA) 2.5 MG tablet Take 1 tablet (2.5 mg total) by mouth daily. (Patient taking differently: Take 2.5 mg by mouth at bedtime.) 90 tablet 3 07/10/2020 at pm  . metoprolol succinate (TOPROL-XL) 100 MG  24 hr tablet Take 100 mg by mouth daily.    07/11/2020 at 8am  . sevelamer carbonate (RENVELA) 800 MG tablet Take 800 mg by mouth 3 (three) times daily with meals.   12/16? at am  . ascorbic acid (VITAMIN C) 500 MG tablet Take 500 mg by mouth 2 (two) times daily.   not yet  . azithromycin (ZITHROMAX) 250 MG tablet Take 250 mg by mouth daily.   not yet  . dexamethasone (DECADRON) 6 MG tablet Take 6 mg by mouth daily.   not yet  . dextromethorphan-guaiFENesin (MUCINEX DM) 30-600 MG 12hr tablet Take 1 tablet by mouth 2 (two) times daily.   not yet  . zinc sulfate 220 (50 Zn) MG capsule Take 220 mg by mouth 2 (two) times daily.   not yet   Scheduled:  . amiodarone  200 mg Oral Daily  . aspirin EC  81 mg Oral Daily  . atorvastatin  20 mg Oral QHS  . chlorhexidine  15 mL Mouth Rinse BID  . Chlorhexidine Gluconate Cloth  6 each Topical Daily  . Chlorhexidine Gluconate Cloth  6 each Topical Q0600  . diltiazem  120 mg Oral Daily  . insulin aspart  0-6 Units Subcutaneous Q4H  . mouth rinse  15 mL Mouth Rinse q12n4p  . midodrine  10 mg Oral Once  . mupirocin ointment  1 application Nasal BID  .  pantoprazole  40 mg Oral BID   Infusions:  . sodium chloride Stopped (07/17/20 1515)    Assessment: 79yo female admitted with GIB, on EMS arrival to pt's home they noted facial droop and left-sided weakness >> found to have acute ischemic infarct with hemorrhagic conversion, now w/ resolved GIB, to begin heparin for Afib.  Goal of Therapy:  Heparin level 0.3-0.5 units/ml Monitor platelets by anticoagulation protocol: Yes   Plan:  Will start heparin gtt at 900 units/hr and monitor heparin levels and CBC.  Wynona Neat, PharmD, BCPS  07/18/2020,7:51 AM

## 2020-07-18 NOTE — Progress Notes (Signed)
Physical Therapy Treatment Patient Details Name: [name] MRN: [id number] DOB: [birth year] Today's Date: 07/18/2020    History of Present Illness Pt is a 79 y.o. female with initial COVID dx on 07/07/20 and underwent antibody infusion, admitted 07/12/20 as code stroke with L-side facial droop and flaccid paralysis. Workup revealed 7 mm hemorrhage in R basal ganglia; not a tPA or IR candidate. Pt also with new onset PAF, GIB. S/p EGD 12/23. PMH includes CA, DM, ESRD (HD MWF).   PT Comments    Pt slowly progressing with mobility; limited by fatigue and orthostatic hypotension this session (RN notified, see values below). Pt with increased flat affect and minimal verbalizations; answering questions appropriately, but requiring increased time to follow simple commands with apparent slowed processing. Continue to recommend intensive CIR-level therapies; since this is apparently not an option due to pt's (+) COVID-19 status, pt would benefit from SNF. Will continue to follow acutely.  Orthostatic BPs Supine 134/66  Sitting 123/69  Standing 118/60  Transfer to recliner 98/56  Seated ~4-min 120/68     Follow Up Recommendations  CIR;Supervision/Assistance - 24 hour     Equipment Recommendations   (TBD)    Recommendations for Other Services       Precautions / Restrictions Precautions Precautions: Fall;Other (comment) Precaution Comments: Watch SpO2 and BP (orthostatic) Restrictions Weight Bearing Restrictions: No    Mobility  Bed Mobility Overal bed mobility: Needs Assistance Bed Mobility: Sidelying to Sit   Sidelying to sit: Supervision;HOB elevated       General bed mobility comments: Increased time to come to EOB requiring cues to attend to task; use of BUEs to assist RLE to EOB (reports due to pain) - knee strength WFL but hip flex/abd limited by pain  Transfers Overall transfer level: Needs assistance Equipment used: Rolling walker (2 wheeled) Transfers: Sit to/from  Stand Sit to Stand: Min assist         General transfer comment: MinA for trunk elevation and stability; c/o slight dizziness upon standing; (+) orthostatic hypotension  Ambulation/Gait Ambulation/Gait assistance: Min guard Gait Distance (Feet): 2 Feet     Gait velocity: Decreased Gait velocity interpretation: <1.31 ft/sec, indicative of household ambulator General Gait Details: Steps to recliner with RW and min guard; deferred further mobility secondary to (+) orthostatic hypotension and pt symptomatic, limited by fatigue   Stairs             Wheelchair Mobility    Modified Rankin (Stroke Patients Only) Modified Rankin (Stroke Patients Only) Pre-Morbid Rankin Score: No significant disability Modified Rankin: Moderately severe disability     Balance Overall balance assessment: Needs assistance   Sitting balance-Leahy Scale: Fair       Standing balance-Leahy Scale: Poor Standing balance comment: Reliant on UE support, unable to accept challenge                            Cognition Arousal/Alertness: Awake/alert Behavior During Therapy: Flat affect Overall Cognitive Status: Impaired/Different from baseline Area of Impairment: Attention;Memory;Following commands;Safety/judgement;Awareness;Problem solving                   Current Attention Level: Sustained Memory: Decreased short-term memory Following Commands: Follows one step commands with increased time;Follows multi-step commands inconsistently Safety/Judgement: Decreased awareness of safety;Decreased awareness of deficits Awareness: Emergent Problem Solving: Slow processing;Decreased initiation;Difficulty sequencing;Requires verbal cues;Requires tactile cues General Comments: Increased flat affect this session, pt reports fatigued and that cognition still "off". Increased  time to follow commands and complete tasks      Exercises      General Comments General comments (skin  integrity, edema, etc.): SpO2 87-94% on RA, maintaining 91-96% on 1L O2 [place]      Pertinent Vitals/Pain Pain Assessment: Faces Faces Pain Scale: Hurts little more Pain Location: R hip pain Pain Descriptors / Indicators: Discomfort;Aching Pain Intervention(s): Limited activity within patient's tolerance;Monitored during session (declined pain meds)    Home Living                      Prior Function            PT Goals (current goals can now be found in the care plan section) Progress towards PT goals: Progressing toward goals (slowly)    Frequency    Min 4X/week      PT Plan Current plan remains appropriate    Co-evaluation              AM-PAC PT "6 Clicks" Mobility   Outcome Measure  Help needed turning from your back to your side while in a flat bed without using bedrails?: A Little Help needed moving from lying on your back to sitting on the side of a flat bed without using bedrails?: A Little Help needed moving to and from a bed to a chair (including a wheelchair)?: A Little Help needed standing up from a chair using your arms (e.g., wheelchair or bedside chair)?: A Little Help needed to walk in hospital room?: A Little Help needed climbing 3-5 steps with a railing? : A Lot 6 Click Score: 17    End of Session Equipment Utilized During Treatment: Oxygen Activity Tolerance: Patient limited by fatigue;Treatment limited secondary to medical complications (Comment) Patient left: in chair;with call bell/phone within reach;with chair alarm set Nurse Communication: Mobility status PT Visit Diagnosis: Other abnormalities of gait and mobility (R26.89);Other symptoms and signs involving the nervous system (B76.283)     Time: 1517-6160 PT Time Calculation (min) (ACUTE ONLY): 26 min  Charges:  $Therapeutic Activity: 23-37 mins                     Mabeline Caras, PT, DPT Acute Rehabilitation Services  Pager [phone] Office [phone]  Derry Lory 07/18/2020, 11:21 AM

## 2020-07-18 NOTE — Progress Notes (Signed)
PROGRESS NOTE                                                                                                                                                                                                             Patient Demographics:    [name], is a 79 y.o. female, DOB - Aug 03, 1940, [id number]  Outpatient Primary MD for the patient is [name], MD    LOS - 6  Admit date - 07/12/2020    Chief Complaint  Patient presents with  . Code Stroke  . GI Bleeding       Brief Narrative (HPI from H&P) - this is a 79 year old female with history of hypertension, lateral hip fractures, right-sided breast cancer s/p treatment including lumpectomy about 5 years ago, ESRD, dyslipidemia, DM type II, A. fib who was brought in the hospital on 07/12/2020 with altered mental status, was found to have ischemic stroke with hemorrhagic conversion, her stay was complicated by GI bleed and COVID-19 infection.  She was initially admitted under PCCM and was transferred to hospitalist service on 07/15/2020.  She has been seen so far by nephrology, neurology and PCCM.   Subjective:   Patient in bed denies any headache chest pain or shortness of breath, mild sore throat after EGD, no new focal weakness.   Assessment  & Plan :     1. Acute right basal ganglia hemorrhagic stroke- with minimal left-sided weakness, this appears to be ischemic infarct with hemorrhagic conversion due to A. Fib.  Seen by stroke team, case discussed with Dr. Leonie Man on 07/15/2020.  For now baby aspirin along with home dose statin.  LDL was within acceptable limits, echocardiogram nonacute, A1c slightly elevated will defer for PCP for better glycemic control.  Continue PT-OT and speech therapy.  Resume anticoagulation when stable from GI standpoint.  Repeat noncontrast head CT 07/16/2020 as she has some grip issues in the right hand off and on, discussed with Dr.  Leonie Man no new recommendations.  2.  A. fib RVR.  Likely found this admission.  She is currently in and out of A. fib and RVR, have started her on Cardizem, amiodarone added by cardiology, stable TSH & echocardiogram noted with preserved EF, currently no anticoagulation due to recent bleed, discussed the case with neurology, audiology following, per neurology if EGD shows no signs  of bleeding and H&H stable can start Eliquis in a few days after stable EGD.  3. Acute GI bleed with blood loss related anemia.  Appears to be upper GI.  S/p packed RBC transfusion on 07/12/2020 1 unit, monitor H&H, PPI, GI on board and she underwent EGD on 07/17/2020, discussed the case with Dr. Nicki Guadalajara, okay for heparin drip which will be started on 07/18/2020 along with twice daily PPI.  If no further bleed for 2 days we will switch her to oral Eliquis  4.  ESRD.  On MWF schedule.  Case discussed with Dr. Hollie Salk nephrologist, might get an extra done on 07/15/2020  5.  Dyslipidemia.  On statin.  6.  Essential hypertension.  Diltiazem added hydralazine for better control.  7.  Mild metabolic encephalopathy.  Worsening due to recent stroke.  Supportive care.  8.  COVID-19 infection diagnosed on 07/07/2020.  S/p antibody infusion.  Monitor.    9.  History of right-sided breast cancer.  Outpatient follow-up with oncology doses 70-year-old problem.    10.  Hip pain.  Chronic.  CT scan stable.  Monitor with supportive care.  PT OT.    11. DM type II.  On sliding scale monitor and adjust. DM and Insulin education.  Lab Results  Component Value Date   HGBA1C 7.9 (H) 07/13/2020    CBG (last 3)  Recent Labs    07/17/20 2352 07/18/20 0357 07/18/20 0725  GLUCAP 155* 155* 138*    Lab Results  Component Value Date   CHOL 99 07/14/2020   HDL 31 (L) 07/14/2020   LDLCALC 32 07/14/2020   TRIG 178 (H) 07/14/2020   CHOLHDL 3.2 07/14/2020    Lab Results  Component Value Date   HGBA1C 7.9 (H) 07/13/2020       Lab Results  Component Value Date   TSH 1.219 07/16/2020        Condition - Extremely Guarded  Family Communication  :   Husband Mallie Mussel [phone] on 07/15/2020 message left at 11:36 AM.    Fredric Dine [phone] on 07/15/2020,  07/16/20, 07/17/20 message left at 2pm, 07/18/2020  Code Status :  Full  Consults  : Neurology, PCCM, nephrology, cardiology  Procedures  :    EGD 12/23 - gastritis and duodenitis.  CT HEAD  07/12/2020  -   Acute 7 mm hemorrhage within the right basal ganglia. No substantial mass effect. Chronic microvascular ischemic disease and remote right cerebellar lacunar infarct.   CT HEAD WO CONTRAST - 07/13/2020 expected evolutionary changes in the acute right basal ganglia hemorrhage overall similar in size with slightly decreased density compared to previous CT.  No new abnormality.  MRI / MRA Head - unchanged 1 cm right posterior lentiform nucleus/internal capsule hemorrhage without significant surrounding edema or mass-effect.  4 mm diffusion abnormality involving subcortical white matter in the right parietal corona radiata consistent with acute small vessel type infarct.    MRA of the brain technically limited due to motion artifact but no large vessel occlusion.  Focal high-grade stenosis of the right distal M1 segment and distal left M1 segment bifurcation.  Severe multifocal bilateral P2 and distal left P3 stenosis.  Transthoracic Echocardiogram - left ventricular ejection fraction 60 to 65%.  00/00/2021  Bilateral Carotid Dopplers - bilateral 1-39% stenosis of carotids.    PUD Prophylaxis : PPI  Disposition Plan  :    Status is: Inpatient  Remains inpatient appropriate because:IV treatments appropriate due to intensity of illness or inability to  take PO   Dispo: The patient is from: Home              Anticipated d/c is to: SNF              Anticipated d/c date is: > 3 days              Patient currently is not  medically stable to d/c.   DVT Prophylaxis  :   SCDs >> Heparin   Lab Results  Component Value Date   PLT 130 (L) 07/18/2020    Diet :  Diet Order            DIET SOFT Room service appropriate? Yes; Fluid consistency: Thin  Diet effective now                  Inpatient Medications  Scheduled Meds: . amiodarone  200 mg Oral Daily  . aspirin EC  81 mg Oral Daily  . atorvastatin  20 mg Oral QHS  . chlorhexidine  15 mL Mouth Rinse BID  . Chlorhexidine Gluconate Cloth  6 each Topical Daily  . Chlorhexidine Gluconate Cloth  6 each Topical Q0600  . diltiazem  120 mg Oral Daily  . insulin aspart  0-6 Units Subcutaneous Q4H  . mouth rinse  15 mL Mouth Rinse q12n4p  . midodrine  10 mg Oral Once  . pantoprazole  40 mg Oral BID   Continuous Infusions: . sodium chloride Stopped (07/17/20 1515)  . heparin 900 Units/hr (07/18/20 0904)   PRN Meds:.sodium chloride, docusate sodium, guaiFENesin-dextromethorphan, heparin sodium (porcine), hydrALAZINE, metoprolol tartrate, ondansetron (ZOFRAN) IV, polyethylene glycol, sodium chloride flush  Antibiotics  :    Anti-infectives (From admission, onward)   None       Time Spent in minutes  30   Lala Lund M.D on 07/18/2020 at 10:07 AM  To page go to [email or web address]   [facility] -  Office  [phone]   See all Orders from today for further details    Objective:   Vitals:   07/17/20 2350 07/18/20 0355 07/18/20 0359 07/18/20 0726  BP: (!) 143/75 131/87  108/80  Pulse: 88 99  94  Resp: 12 16  20   Temp: 38.1 F (37.1 C) 98.1 F (36.7 C)  98.7 F (37.1 C)  TempSrc: Oral Oral  Oral  SpO2: 95% 93%  95%  Weight:   74.6 kg   Height:        Wt Readings from Last 3 Encounters:  07/18/20 74.6 kg  04/21/20 77.9 kg  04/20/19 90.2 kg     Intake/Output Summary (Last 24 hours) at 07/18/2020 1007 Last data filed at 07/17/2020 1456 Gross per 24 hour  Intake 200 ml  Output 2650 ml  Net -2450 ml     Physical  Exam  Awake Alert, No new F.N deficits, Normal affect Bode.AT,PERRAL Supple Neck,No JVD, No cervical lymphadenopathy appriciated.  Symmetrical Chest wall movement, Good air movement bilaterally, CTAB RRR,No Gallops, Rubs or new Murmurs, No Parasternal Heave +ve B.Sounds, Abd Soft, No tenderness, No organomegaly appriciated, No rebound - guarding or rigidity. No Cyanosis, Clubbing or edema, No new Rash or bruise    Data Review:    CBC Recent Labs  Lab 07/12/20 0947 07/12/20 0954 07/14/20 0232 07/15/20 0251 07/16/20 0146 07/17/20 0152 07/18/20 0050  WBC 5.4   < > 7.9 6.0 4.6 5.8 5.1  HGB 10.8*   < > 12.2 11.7* 10.7* 11.0* 12.0  HCT 35.4*   < >  38.4 35.9* 32.6* 33.6* 37.7  PLT 123*   < > 112* 104* 113* 136* 130*  MCV 95.2   < > 90.8 90.0 91.6 89.4 90.8  MCH 29.0   < > 28.8 29.3 30.1 29.3 28.9  MCHC 30.5   < > 31.8 32.6 32.8 32.7 31.8  RDW 18.2*   < > 17.9* 17.5* 17.6* 17.2* 17.2*  LYMPHSABS 0.4*  --   --   --  0.8 0.6* 0.7  MONOABS 0.3  --   --   --  0.7 0.8 1.1*  EOSABS 0.0  --   --   --  0.0 0.0 0.0  BASOSABS 0.1  --   --   --  0.0 0.0 0.0   < > = values in this interval not displayed.    Recent Labs  Lab 07/12/20 0947 07/12/20 0954 07/13/20 0412 07/14/20 0232 07/15/20 0251 07/16/20 0146 07/17/20 0152 07/18/20 0050  NA 137   < > 137 141 138 138 138 137  K 3.9   < > 3.8 4.3 3.9 3.0* 3.6 3.9  CL 99   < > 105 105 101 100 98 98  CO2 20*   < > 17* 16* 21* 24 23 25   GLUCOSE 154*   < > 155* 122* 89 86 221* 140*  BUN 72*   < > 84* 90* 41* 24* 37* 17  CREATININE 5.34*   < > 5.55* 6.73* 4.79* 4.12* 5.93* 4.37*  CALCIUM 8.1*   < > 7.6* 8.0* 7.7* 7.5* 8.3* 8.6*  AST 51*  --   --   --  75* 50* 45* 49*  ALT 25  --   --   --  24 22 23 28   ALKPHOS 51  --   --   --  51 49 72 79  BILITOT 0.6  --   --   --  0.8 0.9 0.7 0.9  ALBUMIN 2.7*  --   --  2.4* 2.2* 2.5* 2.8* 2.8*  MG  --    < >  --  2.2 1.9 1.9 2.1 1.8  CRP  --   --   --   --   --  12.5* 12.1* 8.9*  DDIMER  --   --    --   --   --  1.54* 1.86* 2.21*  INR 1.1  --   --   --   --   --   --   --   TSH  --   --   --   --   --  1.219  --   --   HGBA1C  --   --  7.9*  --   --   --   --   --   BNP  --   --   --   --   --  325.5* 226.6* 179.8*   < > = values in this interval not displayed.    ------------------------------------------------------------------------------------------------------------------ No results for input(s): CHOL, HDL, LDLCALC, TRIG, CHOLHDL, LDLDIRECT in the last 72 hours.  Lab Results  Component Value Date   HGBA1C 7.9 (H) 07/13/2020   ------------------------------------------------------------------------------------------------------------------ Recent Labs    07/16/20 0146  TSH 1.219    Cardiac Enzymes No results for input(s): CKMB, TROPONINI, MYOGLOBIN in the last 168 hours.  Invalid input(s): CK ------------------------------------------------------------------------------------------------------------------    Component Value Date/Time   BNP 179.8 (H) 07/18/2020 0050    Micro Results Recent Results (from the past 240 hour(s))  MRSA PCR Screening  Status: Abnormal   Collection Time: 07/12/20  6:37 PM   Specimen: Nasal Mucosa; Nasopharyngeal  Result Value Ref Range Status   MRSA by PCR POSITIVE (A) NEGATIVE Final    Comment:        The GeneXpert MRSA Assay (FDA approved for NASAL specimens only), is one component of a comprehensive MRSA colonization surveillance program. It is not intended to diagnose MRSA infection nor to guide or monitor treatment for MRSA infections. RESULT CALLED TO, READ BACK BY AND VERIFIED WITH: J. Dominica Severin 2139 07/12/2020 Mena Goes Performed at [facility], [place] [address] [place], [place] [address]     Radiology Reports DG Chest 1 View  Result Date: 07/12/2020 CLINICAL DATA:  Patient with history of COVID-19. History of GI bleed. EXAM: CHEST  1 VIEW COMPARISON:  Chest radiograph 07/10/2020. FINDINGS: Stable  right-sided dialysis catheter. Monitoring leads overlie the patient. Stable cardiomegaly. Aortic atherosclerosis. Site oval increased patchy opacities left mid lower lung. No definite pleural effusion or pneumothorax. Thoracic spine degenerative changes. Surgical clips right axilla. IMPRESSION: Increasing patchy opacities left mid and lower lung may represent infection in the appropriate clinical setting or potentially atelectasis. Electronically Signed   By: Lovey Newcomer M.D.   On: 07/12/2020 11:24   CT HEAD WO CONTRAST  Result Date: 07/16/2020 CLINICAL DATA:  Stroke follow-up. EXAM: CT HEAD WITHOUT CONTRAST TECHNIQUE: Contiguous axial images were obtained from the base of the skull through the vertex without intravenous contrast. COMPARISON:  07/13/2020 head CT and MRI FINDINGS: Brain: Approximately 1 cm hyperdense focus at the posterior aspect of the right lentiform nucleus corresponding to the previously demonstrated hemorrhage is unchanged in size. There is no associated edema. No new intracranial hemorrhage, acute large territory infarct, mass, midline shift, or extra-axial fluid collection is identified. Mild cerebral atrophy is within normal limits for age. Hypodensities in the cerebral white matter bilaterally are unchanged and nonspecific but compatible with mild to moderate chronic small vessel ischemic disease. Vascular: Calcified atherosclerosis at the skull base. Skull: No fracture or suspicious osseous lesion. Sinuses/Orbits: Moderate mucosal thickening in the frontal sinuses. Small right mastoid effusion. Bilateral cataract extraction. Other: None. IMPRESSION: 1. Unchanged 1 cm right basal ganglia hemorrhage. 2. No new intracranial abnormality. Electronically Signed   By: Logan Bores M.D.   On: 07/16/2020 15:15   CT HEAD WO CONTRAST  Result Date: 07/13/2020 CLINICAL DATA:  Follow-up examination of basal ganglia hemorrhage. EXAM: CT HEAD WITHOUT CONTRAST TECHNIQUE: Contiguous axial images  were obtained from the base of the skull through the vertex without intravenous contrast. COMPARISON:  Prior CT from 07/12/2020 FINDINGS: Brain: Previously identified intraparenchymal hemorrhage at the posterior right lentiform nucleus again seen. Bleed has mildly dispersed and is less dense in appearance as compared to previous exam, measuring overall similar in size at 10 x 7 x 10 mm, previously 10 x 5 x 10 mm when measured in a similar fashion. No significant surrounding edema or regional mass effect. No other acute or interval intracranial hemorrhage. No other acute large vessel territory infarct. No mass lesion, mass effect, or midline shift. No hydrocephalus or extra-axial fluid collection. Atrophy with chronic microvascular ischemic disease again noted. Vascular: No hyperdense vessel. Calcified atherosclerosis at the skull base. Skull: Scalp soft tissues and calvarium within normal limits. Sinuses/Orbits: Globes and orbital soft tissues demonstrate no acute finding. Mild mucoperiosteal thickening noted throughout the visualized paranasal sinuses. Mastoid air cells remain clear. Other: None. IMPRESSION: 1. Normal expected interval evolution of acute intraparenchymal hemorrhage  positioned at the posterior right lentiform nucleus, overall similar in size but with slightly decreased density as compared to previous. No significant surrounding edema or regional mass effect. 2. No other new acute intracranial abnormality. 3. Atrophy with chronic microvascular ischemic disease. Electronically Signed   By: Jeannine Boga M.D.   On: 07/13/2020 18:45   CT PELVIS WO CONTRAST  Result Date: 07/14/2020 CLINICAL DATA:  Right hip pain EXAM: CT PELVIS WITHOUT CONTRAST TECHNIQUE: Multidetector CT imaging of the pelvis was performed following the standard protocol without intravenous contrast. COMPARISON:  Radiographs 07/12/2020 FINDINGS: Urinary Tract: Bladder and distal ureters obscured by streak artifact from the  patient's bilateral hip hardware. Bowel:  Unremarkable, rectum partially obscured by streak artifact. Vascular/Lymphatic: Aortoiliac atherosclerotic vascular disease. No pathologic adenopathy identified. Reproductive:  Unremarkable Other:  No supplemental non-categorized findings. Musculoskeletal: Right hip screw with IM nail. Single distal interlocking screw along the IM nail. Inter trochanteric deformity from old fracture, no acute fracture is identified. Severe degenerative right hip arthropathy with loss of articular space and spurring. Left hip hemiarthroplasty without discrete complicating feature identified. Mild spurring along the SI joints. There is lower lumbar spondylosis and degenerative disc disease leading to moderate left foraminal stenosis at L5-S1. IMPRESSION: 1. Severe degenerative right hip arthropathy. 2. Right hip screw with IM nail. Right intertrochanteric deformity from old fracture. No acute fracture is identified. 3. Left hip hemiarthroplasty without discrete complicating feature identified. 4. Lower lumbar spondylosis and degenerative disc disease leading to moderate left foraminal stenosis at L5-S1. 5. Aortic atherosclerosis. Aortic Atherosclerosis (ICD10-I70.0). Electronically Signed   By: Van Clines M.D.   On: 07/14/2020 14:12   MR ANGIO HEAD WO CONTRAST  Result Date: 07/13/2020 CLINICAL DATA:  Follow-up examination for acute stroke. EXAM: MRI HEAD WITHOUT CONTRAST MRA HEAD WITHOUT CONTRAST TECHNIQUE: Multiplanar, multiecho pulse sequences of the brain and surrounding structures were obtained without intravenous contrast. Angiographic images of the head were obtained using MRA technique without contrast. COMPARISON:  Prior head CT from 07/12/2020 as well as previous brain MRI from 08/17/2016. FINDINGS: MRI HEAD FINDINGS Brain: Diffuse prominence of the CSF containing spaces compatible with generalized age-related cerebral atrophy. Patchy and confluent T2/FLAIR hyperintensity  within the periventricular and deep white matter both cerebral hemispheres most likely related chronic microvascular ischemic disease. Patchy involvement of the pons. Overall, these changes are mildly progressed as compared to 2018. Few small remote lacunar type infarcts noted about the basal ganglia, most notable which present at the right caudothalamic groove. Previously identified hemorrhage centered at the posterior right lentiform nucleus/right internal capsule again seen, stable in size measuring up to approximately 1 cm in greatest dimension. This is somewhat difficult to visualized by MR, and is most notable on T1 weighted sequence (series 16, image 26). No significant surrounding edema or regional mass effect. No other acute intracranial hemorrhage. Few scattered chronic micro hemorrhages noted at the right cerebellum and pons, likely small vessel/hypertensive in nature. Single punctate 4 mm focus of diffusion abnormality involving the subcortical white matter of the right parietal corona radiata noted, likely a small acute to subacute small vessel type ischemic infarct (series 5, image 74). No associated hemorrhage or mass effect. No other diffusion abnormality to suggest acute or subacute ischemia. Gray-white matter differentiation otherwise maintained. No mass lesion, mass effect, or midline shift. No hydrocephalus or extra-axial fluid collection. Pituitary gland suprasellar region normal. Midline structures intact. 1.3 cm ovoid well-circumscribed lesion positioned at the left petrous apex demonstrating heterogeneous T2/FLAIR signal intensity with hyperintense  T1 signal intensity noted (series 16, image 14), nonspecific, but could reflect a small cholesterol granuloma versus asymmetric fatty marrow. Finding is relatively stable from previous MRI from 2018, and of doubtful significance. Vascular: Major intracranial vascular flow voids are maintained. Skull and upper cervical spine: Craniocervical junction  within normal limits. Multilevel degenerative spondylosis noted within the visualized upper cervical spine with resultant mild-to-moderate spinal stenosis. Bone marrow signal intensity within normal limits. No scalp soft tissue abnormality. Sinuses/Orbits: Patient status post bilateral ocular lens replacement. Globes and orbital soft tissues demonstrate no acute finding. Mild scattered mucosal thickening noted throughout the paranasal sinuses. Trace bilateral mastoid effusions, of doubtful significance. Visualized nasopharynx within normal limits. Other: None. MRA HEAD FINDINGS ANTERIOR CIRCULATION: Examination degraded by motion artifact. Visualized distal cervical segments of the internal carotid arteries are patent with symmetric antegrade flow. Petrous segments widely patent. Atheromatous irregularity throughout the carotid siphons with associated mild multifocal narrowing. A1 segments patent bilaterally. Normal anterior communicating artery complex. Anterior cerebral arteries patent to their distal aspects without high-grade stenosis. Short-segment severe stenosis at the distal left M1 segment/left MCA bifurcation (series 1036, image 12). Left MCA branches are diffusely irregular but perfused distally. Right M1 segment patent proximally. Focal signal cutoff at the mid-distal right M1 segment into the bifurcation (series 1036, image 11). This likely reflects a severe high-grade stenosis, as the right MCA branches are perfused distally. Diffuse atheromatous irregularity seen throughout the right MCA branches as well. Proximally. POSTERIOR CIRCULATION: Vertebral arteries largely code dominant and patent to the vertebrobasilar junction. Both PICA origins patent and normal. Basilar patent to its distal aspect without stenosis. Superior cerebral arteries patent bilaterally. Both PCAs primarily supplied via the basilar. Severe long segment stenosis involving the mid-distal right P2 segment (series 1046, image 15).  Severe multifocal distal left P2 and P3 stenoses present as well (series 1046, image 14). PCAs remain perfused to their distal aspects. No intracranial aneurysm or other vascular abnormality. IMPRESSION: MRI HEAD IMPRESSION: 1. Unchanged 1 cm hemorrhage centered at the posterior right lentiform nucleus/right internal capsule. No significant surrounding edema or regional mass effect. 2. 4 mm focus of diffusion abnormality involving the subcortical white matter of the right parietal corona radiata, consistent with a small acute to subacute small vessel type ischemic infarct. No associated hemorrhage. 3. Underlying age-related cerebral atrophy with moderate chronic microvascular ischemic disease, mildly progressed as compared to 2018. MRA HEAD IMPRESSION: 1. Technically limited exam due to motion artifact. 2. Negative intracranial MRA for large vessel occlusion. 3. Focal severe high-grade stenoses involving the mid-distal right M1 segment and distal left M1 segment/left MCA bifurcation. Left MCA branches remain perfused distally. 4. Severe multifocal bilateral P2 and distal left P3 stenoses as above. Electronically Signed   By: Jeannine Boga M.D.   On: 07/13/2020 19:16   MR BRAIN WO CONTRAST  Result Date: 07/13/2020 CLINICAL DATA:  Follow-up examination for acute stroke. EXAM: MRI HEAD WITHOUT CONTRAST MRA HEAD WITHOUT CONTRAST TECHNIQUE: Multiplanar, multiecho pulse sequences of the brain and surrounding structures were obtained without intravenous contrast. Angiographic images of the head were obtained using MRA technique without contrast. COMPARISON:  Prior head CT from 07/12/2020 as well as previous brain MRI from 08/17/2016. FINDINGS: MRI HEAD FINDINGS Brain: Diffuse prominence of the CSF containing spaces compatible with generalized age-related cerebral atrophy. Patchy and confluent T2/FLAIR hyperintensity within the periventricular and deep white matter both cerebral hemispheres most likely related  chronic microvascular ischemic disease. Patchy involvement of the pons. Overall, these changes are mildly  progressed as compared to 2018. Few small remote lacunar type infarcts noted about the basal ganglia, most notable which present at the right caudothalamic groove. Previously identified hemorrhage centered at the posterior right lentiform nucleus/right internal capsule again seen, stable in size measuring up to approximately 1 cm in greatest dimension. This is somewhat difficult to visualized by MR, and is most notable on T1 weighted sequence (series 16, image 26). No significant surrounding edema or regional mass effect. No other acute intracranial hemorrhage. Few scattered chronic micro hemorrhages noted at the right cerebellum and pons, likely small vessel/hypertensive in nature. Single punctate 4 mm focus of diffusion abnormality involving the subcortical white matter of the right parietal corona radiata noted, likely a small acute to subacute small vessel type ischemic infarct (series 5, image 74). No associated hemorrhage or mass effect. No other diffusion abnormality to suggest acute or subacute ischemia. Gray-white matter differentiation otherwise maintained. No mass lesion, mass effect, or midline shift. No hydrocephalus or extra-axial fluid collection. Pituitary gland suprasellar region normal. Midline structures intact. 1.3 cm ovoid well-circumscribed lesion positioned at the left petrous apex demonstrating heterogeneous T2/FLAIR signal intensity with hyperintense T1 signal intensity noted (series 16, image 14), nonspecific, but could reflect a small cholesterol granuloma versus asymmetric fatty marrow. Finding is relatively stable from previous MRI from 2018, and of doubtful significance. Vascular: Major intracranial vascular flow voids are maintained. Skull and upper cervical spine: Craniocervical junction within normal limits. Multilevel degenerative spondylosis noted within the visualized upper  cervical spine with resultant mild-to-moderate spinal stenosis. Bone marrow signal intensity within normal limits. No scalp soft tissue abnormality. Sinuses/Orbits: Patient status post bilateral ocular lens replacement. Globes and orbital soft tissues demonstrate no acute finding. Mild scattered mucosal thickening noted throughout the paranasal sinuses. Trace bilateral mastoid effusions, of doubtful significance. Visualized nasopharynx within normal limits. Other: None. MRA HEAD FINDINGS ANTERIOR CIRCULATION: Examination degraded by motion artifact. Visualized distal cervical segments of the internal carotid arteries are patent with symmetric antegrade flow. Petrous segments widely patent. Atheromatous irregularity throughout the carotid siphons with associated mild multifocal narrowing. A1 segments patent bilaterally. Normal anterior communicating artery complex. Anterior cerebral arteries patent to their distal aspects without high-grade stenosis. Short-segment severe stenosis at the distal left M1 segment/left MCA bifurcation (series 1036, image 12). Left MCA branches are diffusely irregular but perfused distally. Right M1 segment patent proximally. Focal signal cutoff at the mid-distal right M1 segment into the bifurcation (series 1036, image 11). This likely reflects a severe high-grade stenosis, as the right MCA branches are perfused distally. Diffuse atheromatous irregularity seen throughout the right MCA branches as well. Proximally. POSTERIOR CIRCULATION: Vertebral arteries largely code dominant and patent to the vertebrobasilar junction. Both PICA origins patent and normal. Basilar patent to its distal aspect without stenosis. Superior cerebral arteries patent bilaterally. Both PCAs primarily supplied via the basilar. Severe long segment stenosis involving the mid-distal right P2 segment (series 1046, image 15). Severe multifocal distal left P2 and P3 stenoses present as well (series 1046, image 14). PCAs  remain perfused to their distal aspects. No intracranial aneurysm or other vascular abnormality. IMPRESSION: MRI HEAD IMPRESSION: 1. Unchanged 1 cm hemorrhage centered at the posterior right lentiform nucleus/right internal capsule. No significant surrounding edema or regional mass effect. 2. 4 mm focus of diffusion abnormality involving the subcortical white matter of the right parietal corona radiata, consistent with a small acute to subacute small vessel type ischemic infarct. No associated hemorrhage. 3. Underlying age-related cerebral atrophy with moderate chronic microvascular ischemic  disease, mildly progressed as compared to 2018. MRA HEAD IMPRESSION: 1. Technically limited exam due to motion artifact. 2. Negative intracranial MRA for large vessel occlusion. 3. Focal severe high-grade stenoses involving the mid-distal right M1 segment and distal left M1 segment/left MCA bifurcation. Left MCA branches remain perfused distally. 4. Severe multifocal bilateral P2 and distal left P3 stenoses as above. Electronically Signed   By: Jeannine Boga M.D.   On: 07/13/2020 19:16   DG Pelvis Portable  Result Date: 07/12/2020 CLINICAL DATA:  Right hip pain.  No reported injury. EXAM: PORTABLE PELVIS 1-2 VIEWS COMPARISON:  07/21/2018 outside pelvic radiographs FINDINGS: Partially visualized left hip hemiarthroplasty and partially visualized fixation hardware in the proximal right femur with no evidence of hardware fracture or loosening. No evidence of hip dislocation on this single frontal view. No osseous fracture. No focal osseous lesions. Moderate right hip osteoarthritis. No suspicious focal osseous lesions. No pelvic diastasis. IMPRESSION: No acute osseous abnormality. No evidence of hip dislocation on this single frontal view. Moderate right hip osteoarthritis. Partially visualized left hip hemiarthroplasty and proximal right femur fixation hardware, with no evidence of hardware complication. Electronically  Signed   By: Ilona Sorrel M.D.   On: 07/12/2020 12:33   DG CHEST PORT 1 VIEW  Result Date: 07/15/2020 CLINICAL DATA:  COVID-19. EXAM: PORTABLE CHEST 1 VIEW COMPARISON:  07/12/2020. FINDINGS: Right dual-lumen catheter stable position. Stable cardiomegaly. Progressive bilateral pulmonary infiltrates/edema. Tiny bilateral pleural effusions cannot be excluded. No pneumothorax. Degenerative change thoracic spine. Surgical clips right chest. IMPRESSION: 1. Right dual-lumen catheter in stable position. 2. Stable cardiomegaly. 3. Progressive bilateral pulmonary infiltrates/edema. Tiny bilateral pleural effusions cannot be excluded. Electronically Signed   By: Marcello Moores  Register   On: 07/15/2020 05:24   DG Abd Portable 1V  Result Date: 07/13/2020 CLINICAL DATA:  Evaluate orogastric tube EXAM: PORTABLE ABDOMEN - 1 VIEW COMPARISON:  July 13, 2020 FINDINGS: The OG tube side port and distal tip are in the region the stomach. IMPRESSION: The OG tube is in good position. Electronically Signed   By: Dorise Bullion III M.D   On: 07/13/2020 11:04   DG Abd Portable 1V  Result Date: 07/13/2020 CLINICAL DATA:  Vomiting. EXAM: PORTABLE ABDOMEN - 1 VIEW COMPARISON:  None. FINDINGS: No evidence of dilated bowel loops. Aortic atherosclerotic calcification noted. Compression screw noted in the right hip. Left hip prosthesis is also seen. IMPRESSION: Unremarkable bowel gas pattern.  No acute findings. Electronically Signed   By: Marlaine Hind M.D.   On: 07/13/2020 05:08   ECHOCARDIOGRAM COMPLETE  Result Date: 07/14/2020    ECHOCARDIOGRAM REPORT   Patient Name:   SAMANTHIA HOWLAND Date of Exam: 07/14/2020 Medical Rec #:  [id number]     Height:       64.0 in Accession #:    [id number]    Weight:       174.6 lb Date of Birth:  09-08-1940      BSA:          1.847 m Patient Age:    31 years      BP:           114/75 mmHg Patient Gender: F             HR:           77 bpm. Exam Location:  Inpatient Procedure: 2D Echo, Color Doppler  and Cardiac Doppler Indications:    Stroke 434.91 / I163.9  History:  Patient has no prior history of Echocardiogram examinations.                 Risk Factors:Dyslipidemia and Diabetes.  Sonographer:    Bernadene Person RDCS Referring Phys: [place]  1. Left ventricular ejection fraction, by estimation, is 60 to 65%. The left ventricle has normal function. The left ventricle has no regional wall motion abnormalities. There is moderate left ventricular hypertrophy. Left ventricular diastolic parameters are consistent with Grade I diastolic dysfunction (impaired relaxation). Elevated left ventricular end-diastolic pressure. The E/e' is 48.  2. Right ventricular systolic function is normal. The right ventricular size is normal.  3. The pericardial effusion is posterior to the left ventricle.  4. The mitral valve is abnormal. Mild mitral valve regurgitation. Moderate mitral annular calcification.  5. The aortic valve is tricuspid. Aortic valve regurgitation is not visualized. Mild aortic valve sclerosis is present, with no evidence of aortic valve stenosis. Comparison(s): No prior Echocardiogram. FINDINGS  Left Ventricle: Left ventricular ejection fraction, by estimation, is 60 to 65%. The left ventricle has normal function. The left ventricle has no regional wall motion abnormalities. The left ventricular internal cavity size was normal in size. There is  moderate left ventricular hypertrophy. Left ventricular diastolic parameters are consistent with Grade I diastolic dysfunction (impaired relaxation). Elevated left ventricular end-diastolic pressure. The E/e' is 20. Right Ventricle: The right ventricular size is normal. No increase in right ventricular wall thickness. Right ventricular systolic function is normal. Left Atrium: Left atrial size was normal in size. Right Atrium: Right atrial size was normal in size. Pericardium: Trivial pericardial effusion is present. The pericardial  effusion is posterior to the left ventricle. Mitral Valve: The mitral valve is abnormal. There is mild thickening of the mitral valve leaflet(s). Moderate mitral annular calcification. Mild mitral valve regurgitation, with posteriorly-directed jet. Tricuspid Valve: The tricuspid valve is grossly normal. Tricuspid valve regurgitation is trivial. Aortic Valve: The aortic valve is tricuspid. Aortic valve regurgitation is not visualized. Mild aortic valve sclerosis is present, with no evidence of aortic valve stenosis. Pulmonic Valve: The pulmonic valve was normal in structure. Pulmonic valve regurgitation is trivial. Aorta: The aortic root and ascending aorta are structurally normal, with no evidence of dilitation. Venous: The inferior vena cava was not well visualized. IAS/Shunts: No atrial level shunt detected by color flow Doppler.  LEFT VENTRICLE PLAX 2D LVIDd:         4.30 cm  Diastology LVIDs:         2.80 cm  LV e' medial:    5.66 cm/s LV PW:         1.20 cm  LV E/e' medial:  21.9 LV IVS:        1.20 cm  LV e' lateral:   6.20 cm/s LVOT diam:     2.10 cm  LV E/e' lateral: 20.0 LV SV:         103 LV SV Index:   56 LVOT Area:     3.46 cm  RIGHT VENTRICLE RV S prime:     13.90 cm/s TAPSE (M-mode): 1.8 cm LEFT ATRIUM             Index       RIGHT ATRIUM           Index LA diam:        3.30 cm 1.79 cm/m  RA Area:     13.60 cm LA Vol (A2C):   51.0 ml 27.62 ml/m RA  Volume:   28.50 ml  15.43 ml/m LA Vol (A4C):   50.1 ml 27.13 ml/m LA Biplane Vol: 51.9 ml 28.10 ml/m  AORTIC VALVE LVOT Vmax:   143.00 cm/s LVOT Vmean:  98.200 cm/s LVOT VTI:    0.296 m  AORTA Ao Root diam: 3.00 cm Ao Asc diam:  3.40 cm MITRAL VALVE                TRICUSPID VALVE MV Area (PHT): 3.21 cm     TR Peak grad:   23.2 mmHg MV Decel Time: 236 msec     TR Vmax:        241.00 cm/s MV E velocity: 124.00 cm/s MV A velocity: 120.00 cm/s  SHUNTS MV E/A ratio:  1.03         Systemic VTI:  0.30 m                             Systemic Diam: 2.10 cm  Lyman Bishop MD Electronically signed by Lyman Bishop MD Signature Date/Time: 07/14/2020/2:10:04 PM    Final    CT HEAD CODE STROKE WO CONTRAST  Addendum Date: 07/12/2020   ADDENDUM REPORT: 07/12/2020 10:16 ADDENDUM: Correction.  Findings were discussed with Dr. Theda Sers. Electronically Signed   By: Margaretha Sheffield MD   On: 07/12/2020 10:16   Result Date: 07/12/2020 CLINICAL DATA:  Code stroke.  Neuro deficit, acute stroke suspected. EXAM: CT HEAD WITHOUT CONTRAST TECHNIQUE: Contiguous axial images were obtained from the base of the skull through the vertex without intravenous contrast. COMPARISON:  MRI head August 17, 2016 FINDINGS: Brain: Acute hemorrhage in the right basal ganglia, centered in the region of the posterior putamen/posterior limb of the internal capsule. The hemorrhage measures approximately 7 x 4 by 6 mm. No evidence of acute large vascular territory infarct. Patchy white matter hypoattenuation, which is nonspecific but most likely related to chronic microvascular ischemic disease. Mild generalized atrophy. No hydrocephalus. No evidence of a mass lesion. No midline shift. Remote right cerebellar lacunar infarct. Vascular: Calcific atherosclerosis. Skull: No acute fracture. Sinuses/Orbits: Sinuses are clear.  Unremarkable orbits. Other: No mastoid effusion. IMPRESSION: 1. Acute 7 mm hemorrhage within the right basal ganglia. No substantial mass effect. 2. Chronic microvascular ischemic disease and remote right cerebellar lacunar infarct. Code stroke imaging results were communicated on 07/12/2020 at 10:00 am to provider Dr. Lorrin Goodell via telephone, who verbally acknowledged these results. Electronically Signed: By: Margaretha Sheffield MD On: 07/12/2020 10:05   VAS US CAROTID  Result Date: 07/14/2020 Carotid Arterial Duplex Study Indications:       CVA. Risk Factors:      Hyperlipidemia, Diabetes. Limitations        Today's exam was limited due to the patient's inability or                     unwillingness to cooperate. Comparison Study:  no prior Performing Technologist: Abram Sander RVS  Examination Guidelines: A complete evaluation includes B-mode imaging, spectral Doppler, color Doppler, and power Doppler as needed of all accessible portions of each vessel. Bilateral testing is considered an integral part of a complete examination. Limited examinations for reoccurring indications may be performed as noted.  Right Carotid Findings: +----------+--------+--------+--------+------------------+--------------+           PSV cm/sEDV cm/sStenosisPlaque DescriptionComments       +----------+--------+--------+--------+------------------+--------------+ CCA Prox  54      9  heterogenous                     +----------+--------+--------+--------+------------------+--------------+ CCA Distal65      16              heterogenous                     +----------+--------+--------+--------+------------------+--------------+ ICA Prox  48      17      1-39%   heterogenous                     +----------+--------+--------+--------+------------------+--------------+ ICA Distal                                          Not visualized +----------+--------+--------+--------+------------------+--------------+ ECA       91      9                                                +----------+--------+--------+--------+------------------+--------------+ +----------+--------+-------+--------------------------+-------------------+           PSV cm/sEDV cmsDescribe                  Arm Pressure (mmHG) +----------+--------+-------+--------------------------+-------------------+ Subclavian               not visualized due to port                    +----------+--------+-------+--------------------------+-------------------+ +---------+--------+--------+--------------+ VertebralPSV cm/sEDV cm/sNot identified +---------+--------+--------+--------------+  Left  Carotid Findings: +----------+--------+--------+--------+------------------+--------------+           PSV cm/sEDV cm/sStenosisPlaque DescriptionComments       +----------+--------+--------+--------+------------------+--------------+ CCA Prox  53      5               heterogenous                     +----------+--------+--------+--------+------------------+--------------+ CCA Distal73      10              heterogenous                     +----------+--------+--------+--------+------------------+--------------+ ICA Prox  127     16      1-39%   heterogenous                     +----------+--------+--------+--------+------------------+--------------+ ICA Distal                                          Not visualized +----------+--------+--------+--------+------------------+--------------+ ECA       220                                                      +----------+--------+--------+--------+------------------+--------------+ +----------+--------+--------+--------------+-------------------+           PSV cm/sEDV cm/sDescribe      Arm Pressure (mmHG) +----------+--------+--------+--------------+-------------------+ Subclavian                Not identified                    +----------+--------+--------+--------------+-------------------+ +---------+--------+--------+--------------+  [place] cm/sEDV cm/sNot identified +---------+--------+--------+--------------+   Summary: Right Carotid: Velocities in the right ICA are consistent with a 1-39% stenosis. Left Carotid: Velocities in the left ICA are consistent with a 1-39% stenosis. Vertebrals: Bilateral vertebral arteries were not visualized. *See table(s) above for measurements and observations.  Electronically signed by Antony Contras MD on 07/14/2020 at 1:04:27 PM.    Final

## 2020-07-19 ENCOUNTER — Encounter (HOSPITAL_COMMUNITY): Payer: Self-pay | Admitting: Gastroenterology

## 2020-07-19 LAB — HEPARIN LEVEL (UNFRACTIONATED): Heparin Unfractionated: 0.42 IU/mL (ref 0.30–0.70)

## 2020-07-19 LAB — COMPREHENSIVE METABOLIC PANEL
ALT: 31 U/L (ref 0–44)
AST: 45 U/L — ABNORMAL HIGH (ref 15–41)
Albumin: 2.7 g/dL — ABNORMAL LOW (ref 3.5–5.0)
Alkaline Phosphatase: 80 U/L (ref 38–126)
Anion gap: 16 — ABNORMAL HIGH (ref 5–15)
BUN: 34 mg/dL — ABNORMAL HIGH (ref 8–23)
CO2: 23 mmol/L (ref 22–32)
Calcium: 8.7 mg/dL — ABNORMAL LOW (ref 8.9–10.3)
Chloride: 96 mmol/L — ABNORMAL LOW (ref 98–111)
Creatinine, Ser: 6.3 mg/dL — ABNORMAL HIGH (ref 0.44–1.00)
GFR, Estimated: 6 mL/min — ABNORMAL LOW (ref >60)
Glucose, Bld: 133 mg/dL — ABNORMAL HIGH (ref 70–99)
Potassium: 4 mmol/L (ref 3.5–5.1)
Sodium: 135 mmol/L (ref 135–145)
Total Bilirubin: 0.9 mg/dL (ref 0.3–1.2)
Total Protein: 6.6 g/dL (ref 6.5–8.1)

## 2020-07-19 LAB — CBC WITH DIFFERENTIAL/PLATELET
Abs Immature Granulocytes: 0.2 10*3/uL — ABNORMAL HIGH (ref 0.00–0.07)
Basophils Absolute: 0 10*3/uL (ref 0.0–0.1)
Basophils Relative: 1 %
Eosinophils Absolute: 0.1 10*3/uL (ref 0.0–0.5)
Eosinophils Relative: 2 %
HCT: 36.3 % (ref 36.0–46.0)
Hemoglobin: 11.5 g/dL — ABNORMAL LOW (ref 12.0–15.0)
Immature Granulocytes: 4 %
Lymphocytes Relative: 17 %
Lymphs Abs: 0.9 10*3/uL (ref 0.7–4.0)
MCH: 28.8 pg (ref 26.0–34.0)
MCHC: 31.7 g/dL (ref 30.0–36.0)
MCV: 90.8 fL (ref 80.0–100.0)
Monocytes Absolute: 1.1 10*3/uL — ABNORMAL HIGH (ref 0.1–1.0)
Monocytes Relative: 21 %
Neutro Abs: 2.9 10*3/uL (ref 1.7–7.7)
Neutrophils Relative %: 55 %
Platelets: 157 10*3/uL (ref 150–400)
RBC: 4 MIL/uL (ref 3.87–5.11)
RDW: 17.2 % — ABNORMAL HIGH (ref 11.5–15.5)
WBC: 5.1 10*3/uL (ref 4.0–10.5)
nRBC: 0 % (ref 0.0–0.2)

## 2020-07-19 LAB — GLUCOSE, CAPILLARY
Glucose-Capillary: 118 mg/dL — ABNORMAL HIGH (ref 70–99)
Glucose-Capillary: 132 mg/dL — ABNORMAL HIGH (ref 70–99)
Glucose-Capillary: 134 mg/dL — ABNORMAL HIGH (ref 70–99)
Glucose-Capillary: 178 mg/dL — ABNORMAL HIGH (ref 70–99)
Glucose-Capillary: 214 mg/dL — ABNORMAL HIGH (ref 70–99)
Glucose-Capillary: 234 mg/dL — ABNORMAL HIGH (ref 70–99)
Glucose-Capillary: 265 mg/dL — ABNORMAL HIGH (ref 70–99)

## 2020-07-19 LAB — D-DIMER, QUANTITATIVE: D-Dimer, Quant: 1.47 ug/mL-FEU — ABNORMAL HIGH (ref 0.00–0.50)

## 2020-07-19 LAB — C-REACTIVE PROTEIN: CRP: 8.4 mg/dL — ABNORMAL HIGH (ref <1.0)

## 2020-07-19 LAB — BRAIN NATRIURETIC PEPTIDE: B Natriuretic Peptide: 113.8 pg/mL — ABNORMAL HIGH (ref 0.0–100.0)

## 2020-07-19 LAB — MAGNESIUM: Magnesium: 1.8 mg/dL (ref 1.7–2.4)

## 2020-07-19 MED ORDER — HEPARIN SODIUM (PORCINE) 1000 UNIT/ML IJ SOLN
INTRAMUSCULAR | Status: AC
  Filled 2020-07-19: qty 4

## 2020-07-19 MED ORDER — APIXABAN 2.5 MG PO TABS
2.5000 mg | ORAL_TABLET | Freq: Two times a day (BID) | ORAL | Status: DC
  Administered 2020-07-19 – 2020-07-30 (×24): 2.5 mg via ORAL
  Filled 2020-07-19 (×24): qty 1

## 2020-07-19 NOTE — Progress Notes (Addendum)
ANTICOAGULATION CONSULT NOTE - Follow Up Consult  Pharmacy Consult for heparin > apixaban Indication: atrial fibrillation in setting of ischemic infarct with hemorrhagic conversion and GI bleed  Labs: Recent Labs    07/17/20 0152 07/18/20 0050 07/18/20 1546 07/19/20 0200  HGB 11.0* 12.0  --  11.5*  HCT 33.6* 37.7  --  36.3  PLT 136* 130*  --  157  HEPARINUNFRC  --   --  <0.10* 0.42  CREATININE 5.93* 4.37*  --   --     Assessment/Plan:  79yo female therapeutic on heparin after rate change. Will continue gtt at current rate and confirm stable with additional level.   Wynona Neat, PharmD, BCPS  07/19/2020,3:02 AM   Addendum: Now to transition to Eliquis.  Will start Eliquis 2.5mg  PO BID; will stop heparin at time of first dose.  VB 7:25 AM

## 2020-07-19 NOTE — Progress Notes (Signed)
PROGRESS NOTE                                                                                                                                                                                                             Patient Demographics:    [name], is a 79 y.o. female, DOB - 05/23/1941, PIR:[id number]  Outpatient Primary MD for the patient is [name], MD    LOS - 7  Admit date - 07/12/2020    Chief Complaint  Patient presents with  . Code Stroke  . GI Bleeding       Brief Narrative (HPI from H&P) - this is a 79 year old female with history of hypertension, lateral hip fractures, right-sided breast cancer s/p treatment including lumpectomy about 5 years ago, ESRD, dyslipidemia, DM type II, A. fib who was brought in the hospital on 07/12/2020 with altered mental status, was found to have ischemic stroke with hemorrhagic conversion, her stay was complicated by GI bleed and COVID-19 infection.  She was initially admitted under PCCM and was transferred to hospitalist service on 07/15/2020.  She has been seen so far by nephrology, neurology and PCCM.   Subjective:   Patient in chair, appears comfortable, denies any headache, no fever, no chest pain or pressure, no shortness of breath , no abdominal pain. No focal weakness.    Assessment  & Plan :     1. Acute right basal ganglia hemorrhagic stroke- with minimal left-sided weakness, this appears to be ischemic infarct with hemorrhagic conversion due to A. Fib.  Seen by stroke team, case discussed with Dr. Leonie Man on 07/15/2020.  For now baby aspirin along with home dose statin.  LDL was within acceptable limits, echocardiogram nonacute, A1c slightly elevated will defer for PCP for better glycemic control.  Continue PT-OT and speech therapy.  Resume anticoagulation when stable from GI standpoint.  Repeat noncontrast head CT 07/16/2020 as she has some grip issues in the  right hand off and on, discussed with Dr. Leonie Man no new recommendations, right hand issues almost completely resolved.  2.  A. fib RVR.  Likely found this admission.  She is currently in and out of A. fib and RVR, have started her on Cardizem, amiodarone added by cardiology, stable TSH & echocardiogram noted with preserved EF.  See anticoagulation as in #3 below.  3.  Acute GI bleed with blood loss related anemia.  Appears to be upper GI.  S/p packed RBC transfusion on 07/12/2020 1 unit, monitor H&H, PPI, GI on board and she underwent EGD on 07/17/2020, discussed the case with Dr. Nicki Guadalajara and Dr. Leonie Man neurology, okay for heparin drip which was started on 07/18/2020 along with twice daily PPI.  No further bleeding and switched to Eliquis on 07/19/2020.  4.  ESRD.  On MWF schedule.  Case discussed with Dr. Hollie Salk nephrologist, might get an extra done on 07/15/2020  5.  Dyslipidemia.  On statin.  6.  Essential hypertension.  Diltiazem added hydralazine for better control.  7.  Mild metabolic encephalopathy.  Worsening due to recent stroke.  Supportive care.  8.  COVID-19 infection diagnosed on 07/07/2020.  S/p antibody infusion.  Monitor.    9.  History of right-sided breast cancer.  Outpatient follow-up with oncology doses 34-year-old problem.    10.  Hip pain.  Chronic.  CT scan stable.  Monitor with supportive care.  PT OT.    11. DM type II.  On sliding scale monitor and adjust. DM and Insulin education.  Lab Results  Component Value Date   HGBA1C 7.9 (H) 07/13/2020    CBG (last 3)  Recent Labs    07/19/20 0013 07/19/20 0406 07/19/20 0740  GLUCAP 132* 118* 134*    Lab Results  Component Value Date   CHOL 99 07/14/2020   HDL 31 (L) 07/14/2020   LDLCALC 32 07/14/2020   TRIG 178 (H) 07/14/2020   CHOLHDL 3.2 07/14/2020    Lab Results  Component Value Date   HGBA1C 7.9 (H) 07/13/2020      Lab Results  Component Value Date   TSH 1.219 07/16/2020         Condition - Extremely Guarded  Family Communication  :   Husband Mallie Mussel [phone] on 07/15/2020 message left at 11:36 AM.    Fredric Dine [phone] on 07/15/2020,  07/16/20, 07/17/20 message left at 2pm, 07/18/2020  Code Status :  Full  Consults  : Neurology, PCCM, nephrology, cardiology  Procedures  :    EGD 12/23 - gastritis and duodenitis.  CT HEAD  07/12/2020  -   Acute 7 mm hemorrhage within the right basal ganglia. No substantial mass effect. Chronic microvascular ischemic disease and remote right cerebellar lacunar infarct.   CT HEAD WO CONTRAST - 07/13/2020 expected evolutionary changes in the acute right basal ganglia hemorrhage overall similar in size with slightly decreased density compared to previous CT.  No new abnormality.  MRI / MRA Head - unchanged 1 cm right posterior lentiform nucleus/internal capsule hemorrhage without significant surrounding edema or mass-effect.  4 mm diffusion abnormality involving subcortical white matter in the right parietal corona radiata consistent with acute small vessel type infarct.    MRA of the brain technically limited due to motion artifact but no large vessel occlusion.  Focal high-grade stenosis of the right distal M1 segment and distal left M1 segment bifurcation.  Severe multifocal bilateral P2 and distal left P3 stenosis.  Transthoracic Echocardiogram - left ventricular ejection fraction 60 to 65%.  00/00/2021  Bilateral Carotid Dopplers - bilateral 1-39% stenosis of carotids.    PUD Prophylaxis : PPI  Disposition Plan  :    Status is: Inpatient  Remains inpatient appropriate because:IV treatments appropriate due to intensity of illness or inability to take PO   Dispo: The patient is from: Home  Anticipated d/c is to: SNF              Anticipated d/c date is: > 3 days              Patient currently is not medically stable to d/c.   DVT Prophylaxis  :   SCDs >> Heparin   Lab  Results  Component Value Date   PLT 157 07/19/2020    Diet :  Diet Order            DIET SOFT Room service appropriate? Yes; Fluid consistency: Thin  Diet effective now                  Inpatient Medications  Scheduled Meds: . amiodarone  200 mg Oral Daily  . apixaban  2.5 mg Oral BID  . aspirin EC  81 mg Oral Daily  . atorvastatin  20 mg Oral QHS  . chlorhexidine  15 mL Mouth Rinse BID  . Chlorhexidine Gluconate Cloth  6 each Topical Daily  . Chlorhexidine Gluconate Cloth  6 each Topical Q0600  . diltiazem  120 mg Oral Daily  . insulin aspart  0-6 Units Subcutaneous Q4H  . mouth rinse  15 mL Mouth Rinse q12n4p  . midodrine  10 mg Oral Once  . pantoprazole  40 mg Oral BID   Continuous Infusions: . sodium chloride Stopped (07/17/20 1515)   PRN Meds:.sodium chloride, docusate sodium, guaiFENesin-dextromethorphan, heparin sodium (porcine), hydrALAZINE, metoprolol tartrate, ondansetron (ZOFRAN) IV, polyethylene glycol, sodium chloride flush  Antibiotics  :    Anti-infectives (From admission, onward)   None       Time Spent in minutes  30   Lala Lund M.D on 07/19/2020 at 9:59 AM  To page go to [email or web address]   [facility] -  Office  [phone]   See all Orders from today for further details    Objective:   Vitals:   07/18/20 1958 07/19/20 0013 07/19/20 0405 07/19/20 0732  BP: 126/81 135/75 119/63 (!) 149/74  Pulse: 86 84 78 79  Resp: 17 (!) 22 20 18   Temp: 98.1 F (36.7 C) 98.1 F (36.7 C) 97.9 F (36.6 C) 97.8 F (36.6 C)  TempSrc: Oral Oral Oral Oral  SpO2: 93% 96% 91% 92%  Weight:      Height:        Wt Readings from Last 3 Encounters:  07/18/20 74.6 kg  04/21/20 77.9 kg  04/20/19 90.2 kg     Intake/Output Summary (Last 24 hours) at 07/19/2020 0959 Last data filed at 07/19/2020 0900 Gross per 24 hour  Intake 900.47 ml  Output --  Net 900.47 ml     Physical Exam  Awake Alert, No new F.N deficits, Normal  affect [place].AT,PERRAL Supple Neck,No JVD, No cervical lymphadenopathy appriciated.  Symmetrical Chest wall movement, Good air movement bilaterally, CTAB RRR,No Gallops, Rubs or new Murmurs, No Parasternal Heave +ve B.Sounds, Abd Soft, No tenderness, No organomegaly appriciated, No rebound - guarding or rigidity. No Cyanosis, Clubbing or edema, No new Rash or bruise     Data Review:    CBC Recent Labs  Lab 07/15/20 0251 07/16/20 0146 07/17/20 0152 07/18/20 0050 07/19/20 0200  WBC 6.0 4.6 5.8 5.1 5.1  HGB 11.7* 10.7* 11.0* 12.0 11.5*  HCT 35.9* 32.6* 33.6* 37.7 36.3  PLT 104* 113* 136* 130* 157  MCV 90.0 91.6 89.4 90.8 90.8  MCH 29.3 30.1 29.3 28.9 28.8  MCHC 32.6 32.8 32.7 31.8 31.7  RDW 17.5* 17.6*  17.2* 17.2* 17.2*  LYMPHSABS  --  0.8 0.6* 0.7 0.9  MONOABS  --  0.7 0.8 1.1* 1.1*  EOSABS  --  0.0 0.0 0.0 0.1  BASOSABS  --  0.0 0.0 0.0 0.0    Recent Labs  Lab 07/13/20 0412 07/14/20 0232 07/15/20 0251 07/16/20 0146 07/17/20 0152 07/18/20 0050 07/19/20 0200  NA 137   < > 138 138 138 137 135  K 3.8   < > 3.9 3.0* 3.6 3.9 4.0  CL 105   < > 101 100 98 98 96*  CO2 17*   < > 21* 24 23 25 23   GLUCOSE 155*   < > 89 86 221* 140* 133*  BUN 84*   < > 41* 24* 37* 17 34*  CREATININE 5.55*   < > 4.79* 4.12* 5.93* 4.37* 6.30*  CALCIUM 7.6*   < > 7.7* 7.5* 8.3* 8.6* 8.7*  AST  --   --  75* 50* 45* 49* 45*  ALT  --   --  24 22 23 28 31   ALKPHOS  --   --  51 49 72 79 80  BILITOT  --   --  0.8 0.9 0.7 0.9 0.9  ALBUMIN  --    < > 2.2* 2.5* 2.8* 2.8* 2.7*  MG  --    < > 1.9 1.9 2.1 1.8 1.8  CRP  --   --   --  12.5* 12.1* 8.9* 8.4*  DDIMER  --   --   --  1.54* 1.86* 2.21* 1.47*  TSH  --   --   --  1.219  --   --   --   HGBA1C 7.9*  --   --   --   --   --   --   BNP  --   --   --  325.5* 226.6* 179.8* 113.8*   < > = values in this interval not displayed.    ------------------------------------------------------------------------------------------------------------------ No  results for input(s): CHOL, HDL, LDLCALC, TRIG, CHOLHDL, LDLDIRECT in the last 72 hours.  Lab Results  Component Value Date   HGBA1C 7.9 (H) 07/13/2020   ------------------------------------------------------------------------------------------------------------------ No results for input(s): TSH, T4TOTAL, T3FREE, THYROIDAB in the last 72 hours.  Invalid input(s): FREET3  Cardiac Enzymes No results for input(s): CKMB, TROPONINI, MYOGLOBIN in the last 168 hours.  Invalid input(s): CK ------------------------------------------------------------------------------------------------------------------    Component Value Date/Time   BNP 113.8 (H) 07/19/2020 0200    Micro Results Recent Results (from the past 240 hour(s))  MRSA PCR Screening     Status: Abnormal   Collection Time: 07/12/20  6:37 PM   Specimen: Nasal Mucosa; Nasopharyngeal  Result Value Ref Range Status   MRSA by PCR POSITIVE (A) NEGATIVE Final    Comment:        The GeneXpert MRSA Assay (FDA approved for NASAL specimens only), is one component of a comprehensive MRSA colonization surveillance program. It is not intended to diagnose MRSA infection nor to guide or monitor treatment for MRSA infections. RESULT CALLED TO, READ BACK BY AND VERIFIED WITH: J. Dominica Severin 2139 07/12/2020 Mena Goes Performed at [facility], [place] [address] [place], [place] [address]     Radiology Reports DG Chest 1 View  Result Date: 07/12/2020 CLINICAL DATA:  Patient with history of COVID-19. History of GI bleed. EXAM: CHEST  1 VIEW COMPARISON:  Chest radiograph 07/10/2020. FINDINGS: Stable right-sided dialysis catheter. Monitoring leads overlie the patient. Stable cardiomegaly. Aortic atherosclerosis. Site oval increased  patchy opacities left mid lower lung. No definite pleural effusion or pneumothorax. Thoracic spine degenerative changes. Surgical clips right axilla. IMPRESSION: Increasing patchy opacities left mid and lower  lung may represent infection in the appropriate clinical setting or potentially atelectasis. Electronically Signed   By: Lovey Newcomer M.D.   On: 07/12/2020 11:24   CT HEAD WO CONTRAST  Result Date: 07/16/2020 CLINICAL DATA:  Stroke follow-up. EXAM: CT HEAD WITHOUT CONTRAST TECHNIQUE: Contiguous axial images were obtained from the base of the skull through the vertex without intravenous contrast. COMPARISON:  07/13/2020 head CT and MRI FINDINGS: Brain: Approximately 1 cm hyperdense focus at the posterior aspect of the right lentiform nucleus corresponding to the previously demonstrated hemorrhage is unchanged in size. There is no associated edema. No new intracranial hemorrhage, acute large territory infarct, mass, midline shift, or extra-axial fluid collection is identified. Mild cerebral atrophy is within normal limits for age. Hypodensities in the cerebral white matter bilaterally are unchanged and nonspecific but compatible with mild to moderate chronic small vessel ischemic disease. Vascular: Calcified atherosclerosis at the skull base. Skull: No fracture or suspicious osseous lesion. Sinuses/Orbits: Moderate mucosal thickening in the frontal sinuses. Small right mastoid effusion. Bilateral cataract extraction. Other: None. IMPRESSION: 1. Unchanged 1 cm right basal ganglia hemorrhage. 2. No new intracranial abnormality. Electronically Signed   By: Logan Bores M.D.   On: 07/16/2020 15:15   CT HEAD WO CONTRAST  Result Date: 07/13/2020 CLINICAL DATA:  Follow-up examination of basal ganglia hemorrhage. EXAM: CT HEAD WITHOUT CONTRAST TECHNIQUE: Contiguous axial images were obtained from the base of the skull through the vertex without intravenous contrast. COMPARISON:  Prior CT from 07/12/2020 FINDINGS: Brain: Previously identified intraparenchymal hemorrhage at the posterior right lentiform nucleus again seen. Bleed has mildly dispersed and is less dense in appearance as compared to previous exam,  measuring overall similar in size at 10 x 7 x 10 mm, previously 10 x 5 x 10 mm when measured in a similar fashion. No significant surrounding edema or regional mass effect. No other acute or interval intracranial hemorrhage. No other acute large vessel territory infarct. No mass lesion, mass effect, or midline shift. No hydrocephalus or extra-axial fluid collection. Atrophy with chronic microvascular ischemic disease again noted. Vascular: No hyperdense vessel. Calcified atherosclerosis at the skull base. Skull: Scalp soft tissues and calvarium within normal limits. Sinuses/Orbits: Globes and orbital soft tissues demonstrate no acute finding. Mild mucoperiosteal thickening noted throughout the visualized paranasal sinuses. Mastoid air cells remain clear. Other: None. IMPRESSION: 1. Normal expected interval evolution of acute intraparenchymal hemorrhage positioned at the posterior right lentiform nucleus, overall similar in size but with slightly decreased density as compared to previous. No significant surrounding edema or regional mass effect. 2. No other new acute intracranial abnormality. 3. Atrophy with chronic microvascular ischemic disease. Electronically Signed   By: Jeannine Boga M.D.   On: 07/13/2020 18:45   CT PELVIS WO CONTRAST  Result Date: 07/14/2020 CLINICAL DATA:  Right hip pain EXAM: CT PELVIS WITHOUT CONTRAST TECHNIQUE: Multidetector CT imaging of the pelvis was performed following the standard protocol without intravenous contrast. COMPARISON:  Radiographs 07/12/2020 FINDINGS: Urinary Tract: Bladder and distal ureters obscured by streak artifact from the patient's bilateral hip hardware. Bowel:  Unremarkable, rectum partially obscured by streak artifact. Vascular/Lymphatic: Aortoiliac atherosclerotic vascular disease. No pathologic adenopathy identified. Reproductive:  Unremarkable Other:  No supplemental non-categorized findings. Musculoskeletal: Right hip screw with IM nail. Single  distal interlocking screw along the IM nail. Inter trochanteric deformity  from old fracture, no acute fracture is identified. Severe degenerative right hip arthropathy with loss of articular space and spurring. Left hip hemiarthroplasty without discrete complicating feature identified. Mild spurring along the SI joints. There is lower lumbar spondylosis and degenerative disc disease leading to moderate left foraminal stenosis at L5-S1. IMPRESSION: 1. Severe degenerative right hip arthropathy. 2. Right hip screw with IM nail. Right intertrochanteric deformity from old fracture. No acute fracture is identified. 3. Left hip hemiarthroplasty without discrete complicating feature identified. 4. Lower lumbar spondylosis and degenerative disc disease leading to moderate left foraminal stenosis at L5-S1. 5. Aortic atherosclerosis. Aortic Atherosclerosis (ICD10-I70.0). Electronically Signed   By: Van Clines M.D.   On: 07/14/2020 14:12   MR ANGIO HEAD WO CONTRAST  Result Date: 07/13/2020 CLINICAL DATA:  Follow-up examination for acute stroke. EXAM: MRI HEAD WITHOUT CONTRAST MRA HEAD WITHOUT CONTRAST TECHNIQUE: Multiplanar, multiecho pulse sequences of the brain and surrounding structures were obtained without intravenous contrast. Angiographic images of the head were obtained using MRA technique without contrast. COMPARISON:  Prior head CT from 07/12/2020 as well as previous brain MRI from 08/17/2016. FINDINGS: MRI HEAD FINDINGS Brain: Diffuse prominence of the CSF containing spaces compatible with generalized age-related cerebral atrophy. Patchy and confluent T2/FLAIR hyperintensity within the periventricular and deep white matter both cerebral hemispheres most likely related chronic microvascular ischemic disease. Patchy involvement of the pons. Overall, these changes are mildly progressed as compared to 2018. Few small remote lacunar type infarcts noted about the basal ganglia, most notable which present at  the right caudothalamic groove. Previously identified hemorrhage centered at the posterior right lentiform nucleus/right internal capsule again seen, stable in size measuring up to approximately 1 cm in greatest dimension. This is somewhat difficult to visualized by MR, and is most notable on T1 weighted sequence (series 16, image 26). No significant surrounding edema or regional mass effect. No other acute intracranial hemorrhage. Few scattered chronic micro hemorrhages noted at the right cerebellum and pons, likely small vessel/hypertensive in nature. Single punctate 4 mm focus of diffusion abnormality involving the subcortical white matter of the right parietal corona radiata noted, likely a small acute to subacute small vessel type ischemic infarct (series 5, image 74). No associated hemorrhage or mass effect. No other diffusion abnormality to suggest acute or subacute ischemia. Gray-white matter differentiation otherwise maintained. No mass lesion, mass effect, or midline shift. No hydrocephalus or extra-axial fluid collection. Pituitary gland suprasellar region normal. Midline structures intact. 1.3 cm ovoid well-circumscribed lesion positioned at the left petrous apex demonstrating heterogeneous T2/FLAIR signal intensity with hyperintense T1 signal intensity noted (series 16, image 14), nonspecific, but could reflect a small cholesterol granuloma versus asymmetric fatty marrow. Finding is relatively stable from previous MRI from 2018, and of doubtful significance. Vascular: Major intracranial vascular flow voids are maintained. Skull and upper cervical spine: Craniocervical junction within normal limits. Multilevel degenerative spondylosis noted within the visualized upper cervical spine with resultant mild-to-moderate spinal stenosis. Bone marrow signal intensity within normal limits. No scalp soft tissue abnormality. Sinuses/Orbits: Patient status post bilateral ocular lens replacement. Globes and orbital  soft tissues demonstrate no acute finding. Mild scattered mucosal thickening noted throughout the paranasal sinuses. Trace bilateral mastoid effusions, of doubtful significance. Visualized nasopharynx within normal limits. Other: None. MRA HEAD FINDINGS ANTERIOR CIRCULATION: Examination degraded by motion artifact. Visualized distal cervical segments of the internal carotid arteries are patent with symmetric antegrade flow. Petrous segments widely patent. Atheromatous irregularity throughout the carotid siphons with associated mild multifocal narrowing. A1  segments patent bilaterally. Normal anterior communicating artery complex. Anterior cerebral arteries patent to their distal aspects without high-grade stenosis. Short-segment severe stenosis at the distal left M1 segment/left MCA bifurcation (series 1036, image 12). Left MCA branches are diffusely irregular but perfused distally. Right M1 segment patent proximally. Focal signal cutoff at the mid-distal right M1 segment into the bifurcation (series 1036, image 11). This likely reflects a severe high-grade stenosis, as the right MCA branches are perfused distally. Diffuse atheromatous irregularity seen throughout the right MCA branches as well. Proximally. POSTERIOR CIRCULATION: Vertebral arteries largely code dominant and patent to the vertebrobasilar junction. Both PICA origins patent and normal. Basilar patent to its distal aspect without stenosis. Superior cerebral arteries patent bilaterally. Both PCAs primarily supplied via the basilar. Severe long segment stenosis involving the mid-distal right P2 segment (series 1046, image 15). Severe multifocal distal left P2 and P3 stenoses present as well (series 1046, image 14). PCAs remain perfused to their distal aspects. No intracranial aneurysm or other vascular abnormality. IMPRESSION: MRI HEAD IMPRESSION: 1. Unchanged 1 cm hemorrhage centered at the posterior right lentiform nucleus/right internal capsule. No  significant surrounding edema or regional mass effect. 2. 4 mm focus of diffusion abnormality involving the subcortical white matter of the right parietal corona radiata, consistent with a small acute to subacute small vessel type ischemic infarct. No associated hemorrhage. 3. Underlying age-related cerebral atrophy with moderate chronic microvascular ischemic disease, mildly progressed as compared to 2018. MRA HEAD IMPRESSION: 1. Technically limited exam due to motion artifact. 2. Negative intracranial MRA for large vessel occlusion. 3. Focal severe high-grade stenoses involving the mid-distal right M1 segment and distal left M1 segment/left MCA bifurcation. Left MCA branches remain perfused distally. 4. Severe multifocal bilateral P2 and distal left P3 stenoses as above. Electronically Signed   By: Jeannine Boga M.D.   On: 07/13/2020 19:16   MR BRAIN WO CONTRAST  Result Date: 07/13/2020 CLINICAL DATA:  Follow-up examination for acute stroke. EXAM: MRI HEAD WITHOUT CONTRAST MRA HEAD WITHOUT CONTRAST TECHNIQUE: Multiplanar, multiecho pulse sequences of the brain and surrounding structures were obtained without intravenous contrast. Angiographic images of the head were obtained using MRA technique without contrast. COMPARISON:  Prior head CT from 07/12/2020 as well as previous brain MRI from 08/17/2016. FINDINGS: MRI HEAD FINDINGS Brain: Diffuse prominence of the CSF containing spaces compatible with generalized age-related cerebral atrophy. Patchy and confluent T2/FLAIR hyperintensity within the periventricular and deep white matter both cerebral hemispheres most likely related chronic microvascular ischemic disease. Patchy involvement of the pons. Overall, these changes are mildly progressed as compared to 2018. Few small remote lacunar type infarcts noted about the basal ganglia, most notable which present at the right caudothalamic groove. Previously identified hemorrhage centered at the posterior  right lentiform nucleus/right internal capsule again seen, stable in size measuring up to approximately 1 cm in greatest dimension. This is somewhat difficult to visualized by MR, and is most notable on T1 weighted sequence (series 16, image 26). No significant surrounding edema or regional mass effect. No other acute intracranial hemorrhage. Few scattered chronic micro hemorrhages noted at the right cerebellum and pons, likely small vessel/hypertensive in nature. Single punctate 4 mm focus of diffusion abnormality involving the subcortical white matter of the right parietal corona radiata noted, likely a small acute to subacute small vessel type ischemic infarct (series 5, image 74). No associated hemorrhage or mass effect. No other diffusion abnormality to suggest acute or subacute ischemia. Gray-white matter differentiation otherwise maintained. No mass lesion,  mass effect, or midline shift. No hydrocephalus or extra-axial fluid collection. Pituitary gland suprasellar region normal. Midline structures intact. 1.3 cm ovoid well-circumscribed lesion positioned at the left petrous apex demonstrating heterogeneous T2/FLAIR signal intensity with hyperintense T1 signal intensity noted (series 16, image 14), nonspecific, but could reflect a small cholesterol granuloma versus asymmetric fatty marrow. Finding is relatively stable from previous MRI from 2018, and of doubtful significance. Vascular: Major intracranial vascular flow voids are maintained. Skull and upper cervical spine: Craniocervical junction within normal limits. Multilevel degenerative spondylosis noted within the visualized upper cervical spine with resultant mild-to-moderate spinal stenosis. Bone marrow signal intensity within normal limits. No scalp soft tissue abnormality. Sinuses/Orbits: Patient status post bilateral ocular lens replacement. Globes and orbital soft tissues demonstrate no acute finding. Mild scattered mucosal thickening noted  throughout the paranasal sinuses. Trace bilateral mastoid effusions, of doubtful significance. Visualized nasopharynx within normal limits. Other: None. MRA HEAD FINDINGS ANTERIOR CIRCULATION: Examination degraded by motion artifact. Visualized distal cervical segments of the internal carotid arteries are patent with symmetric antegrade flow. Petrous segments widely patent. Atheromatous irregularity throughout the carotid siphons with associated mild multifocal narrowing. A1 segments patent bilaterally. Normal anterior communicating artery complex. Anterior cerebral arteries patent to their distal aspects without high-grade stenosis. Short-segment severe stenosis at the distal left M1 segment/left MCA bifurcation (series 1036, image 12). Left MCA branches are diffusely irregular but perfused distally. Right M1 segment patent proximally. Focal signal cutoff at the mid-distal right M1 segment into the bifurcation (series 1036, image 11). This likely reflects a severe high-grade stenosis, as the right MCA branches are perfused distally. Diffuse atheromatous irregularity seen throughout the right MCA branches as well. Proximally. POSTERIOR CIRCULATION: Vertebral arteries largely code dominant and patent to the vertebrobasilar junction. Both PICA origins patent and normal. Basilar patent to its distal aspect without stenosis. Superior cerebral arteries patent bilaterally. Both PCAs primarily supplied via the basilar. Severe long segment stenosis involving the mid-distal right P2 segment (series 1046, image 15). Severe multifocal distal left P2 and P3 stenoses present as well (series 1046, image 14). PCAs remain perfused to their distal aspects. No intracranial aneurysm or other vascular abnormality. IMPRESSION: MRI HEAD IMPRESSION: 1. Unchanged 1 cm hemorrhage centered at the posterior right lentiform nucleus/right internal capsule. No significant surrounding edema or regional mass effect. 2. 4 mm focus of diffusion  abnormality involving the subcortical white matter of the right parietal corona radiata, consistent with a small acute to subacute small vessel type ischemic infarct. No associated hemorrhage. 3. Underlying age-related cerebral atrophy with moderate chronic microvascular ischemic disease, mildly progressed as compared to 2018. MRA HEAD IMPRESSION: 1. Technically limited exam due to motion artifact. 2. Negative intracranial MRA for large vessel occlusion. 3. Focal severe high-grade stenoses involving the mid-distal right M1 segment and distal left M1 segment/left MCA bifurcation. Left MCA branches remain perfused distally. 4. Severe multifocal bilateral P2 and distal left P3 stenoses as above. Electronically Signed   By: Jeannine Boga M.D.   On: 07/13/2020 19:16   DG Pelvis Portable  Result Date: 07/12/2020 CLINICAL DATA:  Right hip pain.  No reported injury. EXAM: PORTABLE PELVIS 1-2 VIEWS COMPARISON:  07/21/2018 outside pelvic radiographs FINDINGS: Partially visualized left hip hemiarthroplasty and partially visualized fixation hardware in the proximal right femur with no evidence of hardware fracture or loosening. No evidence of hip dislocation on this single frontal view. No osseous fracture. No focal osseous lesions. Moderate right hip osteoarthritis. No suspicious focal osseous lesions. No pelvic diastasis. IMPRESSION:  No acute osseous abnormality. No evidence of hip dislocation on this single frontal view. Moderate right hip osteoarthritis. Partially visualized left hip hemiarthroplasty and proximal right femur fixation hardware, with no evidence of hardware complication. Electronically Signed   By: Ilona Sorrel M.D.   On: 07/12/2020 12:33   DG CHEST PORT 1 VIEW  Result Date: 07/15/2020 CLINICAL DATA:  COVID-19. EXAM: PORTABLE CHEST 1 VIEW COMPARISON:  07/12/2020. FINDINGS: Right dual-lumen catheter stable position. Stable cardiomegaly. Progressive bilateral pulmonary infiltrates/edema. Tiny  bilateral pleural effusions cannot be excluded. No pneumothorax. Degenerative change thoracic spine. Surgical clips right chest. IMPRESSION: 1. Right dual-lumen catheter in stable position. 2. Stable cardiomegaly. 3. Progressive bilateral pulmonary infiltrates/edema. Tiny bilateral pleural effusions cannot be excluded. Electronically Signed   By: Marcello Moores  Register   On: 07/15/2020 05:24   DG Abd Portable 1V  Result Date: 07/13/2020 CLINICAL DATA:  Evaluate orogastric tube EXAM: PORTABLE ABDOMEN - 1 VIEW COMPARISON:  July 13, 2020 FINDINGS: The OG tube side port and distal tip are in the region the stomach. IMPRESSION: The OG tube is in good position. Electronically Signed   By: Dorise Bullion III M.D   On: 07/13/2020 11:04   DG Abd Portable 1V  Result Date: 07/13/2020 CLINICAL DATA:  Vomiting. EXAM: PORTABLE ABDOMEN - 1 VIEW COMPARISON:  None. FINDINGS: No evidence of dilated bowel loops. Aortic atherosclerotic calcification noted. Compression screw noted in the right hip. Left hip prosthesis is also seen. IMPRESSION: Unremarkable bowel gas pattern.  No acute findings. Electronically Signed   By: Marlaine Hind M.D.   On: 07/13/2020 05:08   ECHOCARDIOGRAM COMPLETE  Result Date: 07/14/2020    ECHOCARDIOGRAM REPORT   Patient Name:   KAREMA TOCCI Date of Exam: 07/14/2020 Medical Rec #:  [id number]     Height:       64.0 in Accession #:    [id number]    Weight:       174.6 lb Date of Birth:  [birth year]      BSA:          1.847 m Patient Age:    24 years      BP:           114/75 mmHg Patient Gender: F             HR:           77 bpm. Exam Location:  Inpatient Procedure: 2D Echo, Color Doppler and Cardiac Doppler Indications:    Stroke 434.91 / I163.9  History:        Patient has no prior history of Echocardiogram examinations.                 Risk Factors:Dyslipidemia and Diabetes.  Sonographer:    Bernadene Person RDCS Referring Phys: [place]  1. Left ventricular ejection  fraction, by estimation, is 60 to 65%. The left ventricle has normal function. The left ventricle has no regional wall motion abnormalities. There is moderate left ventricular hypertrophy. Left ventricular diastolic parameters are consistent with Grade I diastolic dysfunction (impaired relaxation). Elevated left ventricular end-diastolic pressure. The E/e' is 83.  2. Right ventricular systolic function is normal. The right ventricular size is normal.  3. The pericardial effusion is posterior to the left ventricle.  4. The mitral valve is abnormal. Mild mitral valve regurgitation. Moderate mitral annular calcification.  5. The aortic valve is tricuspid. Aortic valve regurgitation is not visualized. Mild aortic valve sclerosis is present, with no evidence  of aortic valve stenosis. Comparison(s): No prior Echocardiogram. FINDINGS  Left Ventricle: Left ventricular ejection fraction, by estimation, is 60 to 65%. The left ventricle has normal function. The left ventricle has no regional wall motion abnormalities. The left ventricular internal cavity size was normal in size. There is  moderate left ventricular hypertrophy. Left ventricular diastolic parameters are consistent with Grade I diastolic dysfunction (impaired relaxation). Elevated left ventricular end-diastolic pressure. The E/e' is 20. Right Ventricle: The right ventricular size is normal. No increase in right ventricular wall thickness. Right ventricular systolic function is normal. Left Atrium: Left atrial size was normal in size. Right Atrium: Right atrial size was normal in size. Pericardium: Trivial pericardial effusion is present. The pericardial effusion is posterior to the left ventricle. Mitral Valve: The mitral valve is abnormal. There is mild thickening of the mitral valve leaflet(s). Moderate mitral annular calcification. Mild mitral valve regurgitation, with posteriorly-directed jet. Tricuspid Valve: The tricuspid valve is grossly normal. Tricuspid  valve regurgitation is trivial. Aortic Valve: The aortic valve is tricuspid. Aortic valve regurgitation is not visualized. Mild aortic valve sclerosis is present, with no evidence of aortic valve stenosis. Pulmonic Valve: The pulmonic valve was normal in structure. Pulmonic valve regurgitation is trivial. Aorta: The aortic root and ascending aorta are structurally normal, with no evidence of dilitation. Venous: The inferior vena cava was not well visualized. IAS/Shunts: No atrial level shunt detected by color flow Doppler.  LEFT VENTRICLE PLAX 2D LVIDd:         4.30 cm  Diastology LVIDs:         2.80 cm  LV e' medial:    5.66 cm/s LV PW:         1.20 cm  LV E/e' medial:  21.9 LV IVS:        1.20 cm  LV e' lateral:   6.20 cm/s LVOT diam:     2.10 cm  LV E/e' lateral: 20.0 LV SV:         103 LV SV Index:   56 LVOT Area:     3.46 cm  RIGHT VENTRICLE RV S prime:     13.90 cm/s TAPSE (M-mode): 1.8 cm LEFT ATRIUM             Index       RIGHT ATRIUM           Index LA diam:        3.30 cm 1.79 cm/m  RA Area:     13.60 cm LA Vol (A2C):   51.0 ml 27.62 ml/m RA Volume:   28.50 ml  15.43 ml/m LA Vol (A4C):   50.1 ml 27.13 ml/m LA Biplane Vol: 51.9 ml 28.10 ml/m  AORTIC VALVE LVOT Vmax:   143.00 cm/s LVOT Vmean:  98.200 cm/s LVOT VTI:    0.296 m  AORTA Ao Root diam: 3.00 cm Ao Asc diam:  3.40 cm MITRAL VALVE                TRICUSPID VALVE MV Area (PHT): 3.21 cm     TR Peak grad:   23.2 mmHg MV Decel Time: 236 msec     TR Vmax:        241.00 cm/s MV E velocity: 124.00 cm/s MV A velocity: 120.00 cm/s  SHUNTS MV E/A ratio:  1.03         Systemic VTI:  0.30 m  Systemic Diam: 2.10 cm Lyman Bishop MD Electronically signed by Lyman Bishop MD Signature Date/Time: 07/14/2020/2:10:04 PM    Final    CT HEAD CODE STROKE WO CONTRAST  Addendum Date: 07/12/2020   ADDENDUM REPORT: 07/12/2020 10:16 ADDENDUM: Correction.  Findings were discussed with Dr. Theda Sers. Electronically Signed   By: Margaretha Sheffield MD   On: 07/12/2020 10:16   Result Date: 07/12/2020 CLINICAL DATA:  Code stroke.  Neuro deficit, acute stroke suspected. EXAM: CT HEAD WITHOUT CONTRAST TECHNIQUE: Contiguous axial images were obtained from the base of the skull through the vertex without intravenous contrast. COMPARISON:  MRI head August 17, 2016 FINDINGS: Brain: Acute hemorrhage in the right basal ganglia, centered in the region of the posterior putamen/posterior limb of the internal capsule. The hemorrhage measures approximately 7 x 4 by 6 mm. No evidence of acute large vascular territory infarct. Patchy white matter hypoattenuation, which is nonspecific but most likely related to chronic microvascular ischemic disease. Mild generalized atrophy. No hydrocephalus. No evidence of a mass lesion. No midline shift. Remote right cerebellar lacunar infarct. Vascular: Calcific atherosclerosis. Skull: No acute fracture. Sinuses/Orbits: Sinuses are clear.  Unremarkable orbits. Other: No mastoid effusion. IMPRESSION: 1. Acute 7 mm hemorrhage within the right basal ganglia. No substantial mass effect. 2. Chronic microvascular ischemic disease and remote right cerebellar lacunar infarct. Code stroke imaging results were communicated on 07/12/2020 at 10:00 am to provider Dr. Lorrin Goodell via telephone, who verbally acknowledged these results. Electronically Signed: By: Margaretha Sheffield MD On: 07/12/2020 10:05   VAS US CAROTID  Result Date: 07/14/2020 Carotid Arterial Duplex Study Indications:       CVA. Risk Factors:      Hyperlipidemia, Diabetes. Limitations        Today's exam was limited due to the patient's inability or                    unwillingness to cooperate. Comparison Study:  no prior Performing Technologist: Abram Sander RVS  Examination Guidelines: A complete evaluation includes B-mode imaging, spectral Doppler, color Doppler, and power Doppler as needed of all accessible portions of each vessel. Bilateral testing is considered an  integral part of a complete examination. Limited examinations for reoccurring indications may be performed as noted.  Right Carotid Findings: +----------+--------+--------+--------+------------------+--------------+           PSV cm/sEDV cm/sStenosisPlaque DescriptionComments       +----------+--------+--------+--------+------------------+--------------+ CCA Prox  54      9               heterogenous                     +----------+--------+--------+--------+------------------+--------------+ CCA Distal65      16              heterogenous                     +----------+--------+--------+--------+------------------+--------------+ ICA Prox  48      17      1-39%   heterogenous                     +----------+--------+--------+--------+------------------+--------------+ ICA Distal                                          Not visualized +----------+--------+--------+--------+------------------+--------------+ ECA       91  9                                                +----------+--------+--------+--------+------------------+--------------+ +----------+--------+-------+--------------------------+-------------------+           PSV cm/sEDV cmsDescribe                  Arm Pressure (mmHG) +----------+--------+-------+--------------------------+-------------------+ Subclavian               not visualized due to port                    +----------+--------+-------+--------------------------+-------------------+ +---------+--------+--------+--------------+ VertebralPSV cm/sEDV cm/sNot identified +---------+--------+--------+--------------+  Left Carotid Findings: +----------+--------+--------+--------+------------------+--------------+           PSV cm/sEDV cm/sStenosisPlaque DescriptionComments       +----------+--------+--------+--------+------------------+--------------+ CCA Prox  53      5               heterogenous                      +----------+--------+--------+--------+------------------+--------------+ CCA Distal73      10              heterogenous                     +----------+--------+--------+--------+------------------+--------------+ ICA Prox  127     16      1-39%   heterogenous                     +----------+--------+--------+--------+------------------+--------------+ ICA Distal                                          Not visualized +----------+--------+--------+--------+------------------+--------------+ ECA       220                                                      +----------+--------+--------+--------+------------------+--------------+ +----------+--------+--------+--------------+-------------------+           PSV cm/sEDV cm/sDescribe      Arm Pressure (mmHG) +----------+--------+--------+--------------+-------------------+ Subclavian                Not identified                    +----------+--------+--------+--------------+-------------------+ +---------+--------+--------+--------------+ VertebralPSV cm/sEDV cm/sNot identified +---------+--------+--------+--------------+   Summary: Right Carotid: Velocities in the right ICA are consistent with a 1-39% stenosis. Left Carotid: Velocities in the left ICA are consistent with a 1-39% stenosis. Vertebrals: Bilateral vertebral arteries were not visualized. *See table(s) above for measurements and observations.  Electronically signed by Antony Contras MD on 07/14/2020 at 1:04:27 PM.    Final

## 2020-07-19 NOTE — Progress Notes (Signed)
Patient ID: [name], female   DOB: Aug 20, 1940, 79 y.o.   MRN: [id number] [facility] Progress Note   Assessment/ Plan:   1.  COVID-19 infection: This was diagnosed on 12/13 and she underwent monoclonal antibody treatment on 12/17.  She was unvaccinated.  Clinically appears to be stable and without any evidence of pneumonia/hypoxia. 2.  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 3. ESRD: She is usually on a Monday/Wednesday/Friday dialysis schedule and unfortunately could not get hemodialysis yesterday due to high patient volume/staffing ratio; this will be done later today. 4.  GI bleed with acute blood loss anemia: Status post PRBC transfusion earlier with improvement of hemoglobin and hemodynamic status.  Underwent EGD yesterday showing gastritis and duodenitis without any focal bleeding.  Ongoing medical management with PPI. 5. CKD-MBD: Elevated phosphorus level noted, will continue to trend this during hospitalization.  Continue binders/VDRA. 6. Nutrition: Resumed renal diet with fluid restriction 7.  Paroxysmal atrial fibrillation with rapid ventricular response: In sinus rhythm when seen, Eliquis restarted today.  Subjective:   Reports that she has no chest pain or shortness of breath and is fatigued.  Denies any focal complaints.  Unfortunately, did not get hemodialysis yesterday due to high patient volume and on schedule for dialysis later today.   Objective:   BP (!) 149/74 (BP Location: Right Arm)   Pulse 79   Temp 97.8 F (36.6 C) (Oral)   Resp 18   Ht 5\' 4"  (1.626 m)   Wt 74.6 kg   SpO2 92%   BMI 28.23 kg/m   Physical Exam: Gen: Comfortably sitting up in recliner, appears chronically ill CVS: Pulse regular rhythm, normal rate, S1 and S2 normal Resp: Clear to auscultation bilaterally  without distinct rales or rhonchi.  Right IJ TDC Abd: Soft, flat, nontender, bowel sounds normal Ext: Minimal/trace lower extremity edema  Labs: BMET Recent Labs  Lab 07/13/20 0412 07/14/20 0232 07/15/20 0251 07/16/20 0146 07/17/20 0152 07/18/20 0050 07/19/20 0200  NA 137 141 138 138 138 137 135  K 3.8 4.3 3.9 3.0* 3.6 3.9 4.0  CL 105 105 101 100 98 98 96*  CO2 17* 16* 21* 24 23 25 23   GLUCOSE 155* 122* 89 86 221* 140* 133*  BUN 84* 90* 41* 24* 37* 17 34*  CREATININE 5.55* 6.73* 4.79* 4.12* 5.93* 4.37* 6.30*  CALCIUM 7.6* 8.0* 7.7* 7.5* 8.3* 8.6* 8.7*  PHOS  --  8.6*  --   --   --   --   --    CBC Recent Labs  Lab 07/16/20 0146 07/17/20 0152 07/18/20 0050 07/19/20 0200  WBC 4.6 5.8 5.1 5.1  NEUTROABS 2.9 4.3 3.1 2.9  HGB 10.7* 11.0* 12.0 11.5*  HCT 32.6* 33.6* 37.7 36.3  MCV 91.6 89.4 90.8 90.8  PLT 113* 136* 130* 157      Medications:    . amiodarone  200 mg Oral Daily  . apixaban  2.5 mg Oral BID  . aspirin EC  81 mg Oral Daily  . atorvastatin  20 mg Oral QHS  . chlorhexidine  15 mL Mouth Rinse BID  . Chlorhexidine Gluconate Cloth  6 each Topical Daily  . Chlorhexidine Gluconate Cloth  6 each Topical Q0600  . diltiazem  120 mg Oral Daily  . insulin aspart  0-6 Units Subcutaneous Q4H  . mouth rinse  15 mL  Mouth Rinse q12n4p  . midodrine  10 mg Oral Once  . pantoprazole  40 mg Oral BID   Elmarie Shiley, MD 07/19/2020, 11:10 AM

## 2020-07-20 LAB — CBC WITH DIFFERENTIAL/PLATELET
Abs Immature Granulocytes: 0.16 10*3/uL — ABNORMAL HIGH (ref 0.00–0.07)
Basophils Absolute: 0 10*3/uL (ref 0.0–0.1)
Basophils Relative: 0 %
Eosinophils Absolute: 0.1 10*3/uL (ref 0.0–0.5)
Eosinophils Relative: 2 %
HCT: 34.7 % — ABNORMAL LOW (ref 36.0–46.0)
Hemoglobin: 11 g/dL — ABNORMAL LOW (ref 12.0–15.0)
Immature Granulocytes: 3 %
Lymphocytes Relative: 17 %
Lymphs Abs: 0.9 10*3/uL (ref 0.7–4.0)
MCH: 28.7 pg (ref 26.0–34.0)
MCHC: 31.7 g/dL (ref 30.0–36.0)
MCV: 90.6 fL (ref 80.0–100.0)
Monocytes Absolute: 0.9 10*3/uL (ref 0.1–1.0)
Monocytes Relative: 18 %
Neutro Abs: 3 10*3/uL (ref 1.7–7.7)
Neutrophils Relative %: 60 %
Platelets: 176 10*3/uL (ref 150–400)
RBC: 3.83 MIL/uL — ABNORMAL LOW (ref 3.87–5.11)
RDW: 17 % — ABNORMAL HIGH (ref 11.5–15.5)
WBC: 5 10*3/uL (ref 4.0–10.5)
nRBC: 0 % (ref 0.0–0.2)

## 2020-07-20 LAB — GLUCOSE, CAPILLARY
Glucose-Capillary: 134 mg/dL — ABNORMAL HIGH (ref 70–99)
Glucose-Capillary: 141 mg/dL — ABNORMAL HIGH (ref 70–99)
Glucose-Capillary: 173 mg/dL — ABNORMAL HIGH (ref 70–99)
Glucose-Capillary: 206 mg/dL — ABNORMAL HIGH (ref 70–99)
Glucose-Capillary: 215 mg/dL — ABNORMAL HIGH (ref 70–99)
Glucose-Capillary: 215 mg/dL — ABNORMAL HIGH (ref 70–99)

## 2020-07-20 NOTE — Plan of Care (Signed)

## 2020-07-20 NOTE — Progress Notes (Signed)
Patient currently off of unit at dialysis

## 2020-07-20 NOTE — Progress Notes (Signed)
PROGRESS NOTE                                                                                                                                                                                                             Patient Demographics:    [name], is a 79 y.o. female, DOB - August 30, 1940, IOX:[id number]  Outpatient Primary MD for the patient is [name], MD    LOS - 8  Admit date - 07/12/2020    Chief Complaint  Patient presents with  . Code Stroke  . GI Bleeding       Brief Narrative (HPI from H&P) - this is a 79 year old female with history of hypertension, lateral hip fractures, right-sided breast cancer s/p treatment including lumpectomy about 5 years ago, ESRD, dyslipidemia, DM type II, A. fib who was brought in the hospital on 07/12/2020 with altered mental status, was found to have ischemic stroke with hemorrhagic conversion, her stay was complicated by GI bleed and COVID-19 infection.  She was initially admitted under PCCM and was transferred to hospitalist service on 07/15/2020.  She has been seen so far by nephrology, neurology and PCCM.   Subjective:   Patient in bed, appears comfortable, denies any headache, no fever, no chest pain or pressure, no shortness of breath , no abdominal pain. No focal weakness.   Assessment  & Plan :     1. Acute right basal ganglia hemorrhagic stroke- with minimal left-sided weakness, this appears to be ischemic infarct with hemorrhagic conversion due to A. Fib.  Seen by stroke team, case discussed with Dr. Leonie Man on 07/15/2020.  For now baby aspirin along with home dose statin.  LDL was within acceptable limits, echocardiogram nonacute, A1c slightly elevated will defer for PCP for better glycemic control.  Continue PT-OT and speech therapy.  Resume anticoagulation when stable from GI standpoint.  Repeat noncontrast head CT 07/16/2020 as she has some grip issues in the right  hand off and on, discussed with Dr. Leonie Man no new recommendations, right hand issues almost completely resolved.  2.  A. fib RVR.  Likely found this admission.  She is currently in and out of A. fib and RVR, have started her on Cardizem, amiodarone added by cardiology, stable TSH & echocardiogram noted with preserved EF.  See anticoagulation as in #3 below.  3. Acute  GI bleed with blood loss related anemia.  Appears to be upper GI.  S/p packed RBC transfusion on 07/12/2020 1 unit, monitor H&H, PPI, GI on board and she underwent EGD on 07/17/2020, discussed the case with Dr. Nicki Guadalajara and Dr. Leonie Man neurology, okay for heparin drip which was started on 07/18/2020 along with twice daily PPI.  No further bleeding and switched to Eliquis on 07/19/2020.  4.  ESRD.  On MWF schedule.  Case discussed with Dr. Hollie Salk nephrologist, might get an extra done on 07/15/2020  5.  Dyslipidemia.  On statin.  6.  Essential hypertension.  Diltiazem added hydralazine for better control.  7.  Mild metabolic encephalopathy.  Worsening due to recent stroke.  Supportive care.  8.  COVID-19 infection diagnosed on 07/07/2020.  S/p antibody infusion.  Monitor.    9.  History of right-sided breast cancer.  Outpatient follow-up with oncology doses 79-year-old problem.    10.  Hip pain.  Chronic.  CT scan stable.  Monitor with supportive care.  PT OT.    11. DM type II.  On sliding scale monitor and adjust. DM and Insulin education.  Lab Results  Component Value Date   HGBA1C 7.9 (H) 07/13/2020    CBG (last 3)  Recent Labs    07/19/20 2302 07/20/20 0549 07/20/20 0720  GLUCAP 178* 141* 134*    Lab Results  Component Value Date   CHOL 99 07/14/2020   HDL 31 (L) 07/14/2020   LDLCALC 32 07/14/2020   TRIG 178 (H) 07/14/2020   CHOLHDL 3.2 07/14/2020    Lab Results  Component Value Date   HGBA1C 7.9 (H) 07/13/2020      Lab Results  Component Value Date   TSH 1.219 07/16/2020         Condition - Extremely Guarded  Family Communication  :   Husband Mallie Mussel [phone] on 07/15/2020 message left at 11:36 AM.    Fredric Dine [phone] on 07/15/2020,  07/16/20, 07/17/20 message left at 2pm, 07/18/2020, 07/19/20, 07/20/20 message left at 9:52 AM (family to decide on placement.)  Code Status :  Full  Consults  : Neurology, PCCM, nephrology, cardiology  Procedures  :    EGD 12/23 - gastritis and duodenitis.  CT HEAD  07/12/2020  -   Acute 7 mm hemorrhage within the right basal ganglia. No substantial mass effect. Chronic microvascular ischemic disease and remote right cerebellar lacunar infarct.   CT HEAD WO CONTRAST - 07/13/2020 expected evolutionary changes in the acute right basal ganglia hemorrhage overall similar in size with slightly decreased density compared to previous CT.  No new abnormality.  MRI / MRA Head - unchanged 1 cm right posterior lentiform nucleus/internal capsule hemorrhage without significant surrounding edema or mass-effect.  4 mm diffusion abnormality involving subcortical white matter in the right parietal corona radiata consistent with acute small vessel type infarct.    MRA of the brain technically limited due to motion artifact but no large vessel occlusion.  Focal high-grade stenosis of the right distal M1 segment and distal left M1 segment bifurcation.  Severe multifocal bilateral P2 and distal left P3 stenosis.  Transthoracic Echocardiogram - left ventricular ejection fraction 60 to 65%.  00/00/2021  Bilateral Carotid Dopplers - bilateral 1-39% stenosis of carotids.    PUD Prophylaxis : PPI  Disposition Plan  :    Status is: Inpatient  Remains inpatient appropriate because:IV treatments appropriate due to intensity of illness or inability to take PO   Dispo: The patient is  from: Home              Anticipated d/c is to: SNF              Anticipated d/c date is: > 3 days              Patient currently is not  medically stable to d/c.   DVT Prophylaxis  :   SCDs >> Heparin   Lab Results  Component Value Date   PLT 176 07/20/2020    Diet :  Diet Order            DIET SOFT Room service appropriate? Yes; Fluid consistency: Thin  Diet effective now                  Inpatient Medications  Scheduled Meds: . amiodarone  200 mg Oral Daily  . apixaban  2.5 mg Oral BID  . aspirin EC  81 mg Oral Daily  . atorvastatin  20 mg Oral QHS  . chlorhexidine  15 mL Mouth Rinse BID  . Chlorhexidine Gluconate Cloth  6 each Topical Daily  . Chlorhexidine Gluconate Cloth  6 each Topical Q0600  . diltiazem  120 mg Oral Daily  . heparin sodium (porcine)      . insulin aspart  0-6 Units Subcutaneous Q4H  . mouth rinse  15 mL Mouth Rinse q12n4p  . midodrine  10 mg Oral Once  . pantoprazole  40 mg Oral BID   Continuous Infusions: . sodium chloride Stopped (07/17/20 1515)   PRN Meds:.sodium chloride, docusate sodium, guaiFENesin-dextromethorphan, heparin sodium (porcine), hydrALAZINE, metoprolol tartrate, ondansetron (ZOFRAN) IV, polyethylene glycol, sodium chloride flush  Antibiotics  :    Anti-infectives (From admission, onward)   None       Time Spent in minutes  30   Lala Lund M.D on 07/20/2020 at 9:52 AM  To page go to [email or web address]   [facility] -  Office  [phone]   See all Orders from today for further details    Objective:   Vitals:   07/20/20 0312 07/20/20 0417 07/20/20 0500 07/20/20 0800  BP: 138/74 (!) 151/71 (!) 149/73 138/62  Pulse: 80 73 77 85  Resp: 14 18 15 16   Temp:  98.4 F (36.9 C) 98.3 F (36.8 C) 99 F (37.2 C)  TempSrc:  Oral Oral Oral  SpO2: 95% 95% 93% 95%  Weight:      Height:        Wt Readings from Last 3 Encounters:  07/18/20 74.6 kg  04/21/20 77.9 kg  04/20/19 90.2 kg     Intake/Output Summary (Last 24 hours) at 07/20/2020 0952 Last data filed at 07/20/2020 0417 Gross per 24 hour  Intake 500 ml  Output 1000 ml  Net  -500 ml     Physical Exam  Awake Alert, No new F.N deficits, Normal affect [place].AT,PERRAL Supple Neck,No JVD, No cervical lymphadenopathy appriciated.  Symmetrical Chest wall movement, Good air movement bilaterally, CTAB RRR,No Gallops, Rubs or new Murmurs, No Parasternal Heave +ve B.Sounds, Abd Soft, No tenderness, No organomegaly appriciated, No rebound - guarding or rigidity. No Cyanosis, Clubbing or edema, No new Rash or bruise    Data Review:    CBC Recent Labs  Lab 07/16/20 0146 07/17/20 0152 07/18/20 0050 07/19/20 0200 07/20/20 0524  WBC 4.6 5.8 5.1 5.1 5.0  HGB 10.7* 11.0* 12.0 11.5* 11.0*  HCT 32.6* 33.6* 37.7 36.3 34.7*  PLT 113* 136* 130* 157 176  MCV 91.6 89.4  90.8 90.8 90.6  MCH 30.1 29.3 28.9 28.8 28.7  MCHC 32.8 32.7 31.8 31.7 31.7  RDW 17.6* 17.2* 17.2* 17.2* 17.0*  LYMPHSABS 0.8 0.6* 0.7 0.9 0.9  MONOABS 0.7 0.8 1.1* 1.1* 0.9  EOSABS 0.0 0.0 0.0 0.1 0.1  BASOSABS 0.0 0.0 0.0 0.0 0.0    Recent Labs  Lab 07/15/20 0251 07/16/20 0146 07/17/20 0152 07/18/20 0050 07/19/20 0200  NA 138 138 138 137 135  K 3.9 3.0* 3.6 3.9 4.0  CL 101 100 98 98 96*  CO2 21* 24 23 25 23   GLUCOSE 89 86 221* 140* 133*  BUN 41* 24* 37* 17 34*  CREATININE 4.79* 4.12* 5.93* 4.37* 6.30*  CALCIUM 7.7* 7.5* 8.3* 8.6* 8.7*  AST 75* 50* 45* 49* 45*  ALT 24 22 23 28 31   ALKPHOS 51 49 72 79 80  BILITOT 0.8 0.9 0.7 0.9 0.9  ALBUMIN 2.2* 2.5* 2.8* 2.8* 2.7*  MG 1.9 1.9 2.1 1.8 1.8  CRP  --  12.5* 12.1* 8.9* 8.4*  DDIMER  --  1.54* 1.86* 2.21* 1.47*  TSH  --  1.219  --   --   --   BNP  --  325.5* 226.6* 179.8* 113.8*    ------------------------------------------------------------------------------------------------------------------ No results for input(s): CHOL, HDL, LDLCALC, TRIG, CHOLHDL, LDLDIRECT in the last 72 hours.  Lab Results  Component Value Date   HGBA1C 7.9 (H) 07/13/2020    ------------------------------------------------------------------------------------------------------------------ No results for input(s): TSH, T4TOTAL, T3FREE, THYROIDAB in the last 72 hours.  Invalid input(s): FREET3  Cardiac Enzymes No results for input(s): CKMB, TROPONINI, MYOGLOBIN in the last 168 hours.  Invalid input(s): CK ------------------------------------------------------------------------------------------------------------------    Component Value Date/Time   BNP 113.8 (H) 07/19/2020 0200    Micro Results Recent Results (from the past 240 hour(s))  MRSA PCR Screening     Status: Abnormal   Collection Time: 07/12/20  6:37 PM   Specimen: Nasal Mucosa; Nasopharyngeal  Result Value Ref Range Status   MRSA by PCR POSITIVE (A) NEGATIVE Final    Comment:        The GeneXpert MRSA Assay (FDA approved for NASAL specimens only), is one component of a comprehensive MRSA colonization surveillance program. It is not intended to diagnose MRSA infection nor to guide or monitor treatment for MRSA infections. RESULT CALLED TO, READ BACK BY AND VERIFIED WITH: J. Dominica Severin 2139 07/12/2020 Mena Goes Performed at [facility], [place] [address], [place], [place] [address]     Radiology Reports DG Chest 1 View  Result Date: 07/12/2020 CLINICAL DATA:  Patient with history of COVID-19. History of GI bleed. EXAM: CHEST  1 VIEW COMPARISON:  Chest radiograph 07/10/2020. FINDINGS: Stable right-sided dialysis catheter. Monitoring leads overlie the patient. Stable cardiomegaly. Aortic atherosclerosis. Site oval increased patchy opacities left mid lower lung. No definite pleural effusion or pneumothorax. Thoracic spine degenerative changes. Surgical clips right axilla. IMPRESSION: Increasing patchy opacities left mid and lower lung may represent infection in the appropriate clinical setting or potentially atelectasis. Electronically Signed   By: Lovey Newcomer M.D.   On: 07/12/2020  11:24   CT HEAD WO CONTRAST  Result Date: 07/16/2020 CLINICAL DATA:  Stroke follow-up. EXAM: CT HEAD WITHOUT CONTRAST TECHNIQUE: Contiguous axial images were obtained from the base of the skull through the vertex without intravenous contrast. COMPARISON:  07/13/2020 head CT and MRI FINDINGS: Brain: Approximately 1 cm hyperdense focus at the posterior aspect of the right lentiform nucleus corresponding to the previously demonstrated hemorrhage is unchanged in  size. There is no associated edema. No new intracranial hemorrhage, acute large territory infarct, mass, midline shift, or extra-axial fluid collection is identified. Mild cerebral atrophy is within normal limits for age. Hypodensities in the cerebral white matter bilaterally are unchanged and nonspecific but compatible with mild to moderate chronic small vessel ischemic disease. Vascular: Calcified atherosclerosis at the skull base. Skull: No fracture or suspicious osseous lesion. Sinuses/Orbits: Moderate mucosal thickening in the frontal sinuses. Small right mastoid effusion. Bilateral cataract extraction. Other: None. IMPRESSION: 1. Unchanged 1 cm right basal ganglia hemorrhage. 2. No new intracranial abnormality. Electronically Signed   By: Logan Bores M.D.   On: 07/16/2020 15:15   CT HEAD WO CONTRAST  Result Date: 07/13/2020 CLINICAL DATA:  Follow-up examination of basal ganglia hemorrhage. EXAM: CT HEAD WITHOUT CONTRAST TECHNIQUE: Contiguous axial images were obtained from the base of the skull through the vertex without intravenous contrast. COMPARISON:  Prior CT from 07/12/2020 FINDINGS: Brain: Previously identified intraparenchymal hemorrhage at the posterior right lentiform nucleus again seen. Bleed has mildly dispersed and is less dense in appearance as compared to previous exam, measuring overall similar in size at 10 x 7 x 10 mm, previously 10 x 5 x 10 mm when measured in a similar fashion. No significant surrounding edema or regional  mass effect. No other acute or interval intracranial hemorrhage. No other acute large vessel territory infarct. No mass lesion, mass effect, or midline shift. No hydrocephalus or extra-axial fluid collection. Atrophy with chronic microvascular ischemic disease again noted. Vascular: No hyperdense vessel. Calcified atherosclerosis at the skull base. Skull: Scalp soft tissues and calvarium within normal limits. Sinuses/Orbits: Globes and orbital soft tissues demonstrate no acute finding. Mild mucoperiosteal thickening noted throughout the visualized paranasal sinuses. Mastoid air cells remain clear. Other: None. IMPRESSION: 1. Normal expected interval evolution of acute intraparenchymal hemorrhage positioned at the posterior right lentiform nucleus, overall similar in size but with slightly decreased density as compared to previous. No significant surrounding edema or regional mass effect. 2. No other new acute intracranial abnormality. 3. Atrophy with chronic microvascular ischemic disease. Electronically Signed   By: Jeannine Boga M.D.   On: 07/13/2020 18:45   CT PELVIS WO CONTRAST  Result Date: 07/14/2020 CLINICAL DATA:  Right hip pain EXAM: CT PELVIS WITHOUT CONTRAST TECHNIQUE: Multidetector CT imaging of the pelvis was performed following the standard protocol without intravenous contrast. COMPARISON:  Radiographs 07/12/2020 FINDINGS: Urinary Tract: Bladder and distal ureters obscured by streak artifact from the patient's bilateral hip hardware. Bowel:  Unremarkable, rectum partially obscured by streak artifact. Vascular/Lymphatic: Aortoiliac atherosclerotic vascular disease. No pathologic adenopathy identified. Reproductive:  Unremarkable Other:  No supplemental non-categorized findings. Musculoskeletal: Right hip screw with IM nail. Single distal interlocking screw along the IM nail. Inter trochanteric deformity from old fracture, no acute fracture is identified. Severe degenerative right hip  arthropathy with loss of articular space and spurring. Left hip hemiarthroplasty without discrete complicating feature identified. Mild spurring along the SI joints. There is lower lumbar spondylosis and degenerative disc disease leading to moderate left foraminal stenosis at L5-S1. IMPRESSION: 1. Severe degenerative right hip arthropathy. 2. Right hip screw with IM nail. Right intertrochanteric deformity from old fracture. No acute fracture is identified. 3. Left hip hemiarthroplasty without discrete complicating feature identified. 4. Lower lumbar spondylosis and degenerative disc disease leading to moderate left foraminal stenosis at L5-S1. 5. Aortic atherosclerosis. Aortic Atherosclerosis (ICD10-I70.0). Electronically Signed   By: Van Clines M.D.   On: 07/14/2020 14:12   MR ANGIO  HEAD WO CONTRAST  Result Date: 07/13/2020 CLINICAL DATA:  Follow-up examination for acute stroke. EXAM: MRI HEAD WITHOUT CONTRAST MRA HEAD WITHOUT CONTRAST TECHNIQUE: Multiplanar, multiecho pulse sequences of the brain and surrounding structures were obtained without intravenous contrast. Angiographic images of the head were obtained using MRA technique without contrast. COMPARISON:  Prior head CT from 07/12/2020 as well as previous brain MRI from 08/17/2016. FINDINGS: MRI HEAD FINDINGS Brain: Diffuse prominence of the CSF containing spaces compatible with generalized age-related cerebral atrophy. Patchy and confluent T2/FLAIR hyperintensity within the periventricular and deep white matter both cerebral hemispheres most likely related chronic microvascular ischemic disease. Patchy involvement of the pons. Overall, these changes are mildly progressed as compared to 2018. Few small remote lacunar type infarcts noted about the basal ganglia, most notable which present at the right caudothalamic groove. Previously identified hemorrhage centered at the posterior right lentiform nucleus/right internal capsule again seen, stable  in size measuring up to approximately 1 cm in greatest dimension. This is somewhat difficult to visualized by MR, and is most notable on T1 weighted sequence (series 16, image 26). No significant surrounding edema or regional mass effect. No other acute intracranial hemorrhage. Few scattered chronic micro hemorrhages noted at the right cerebellum and pons, likely small vessel/hypertensive in nature. Single punctate 4 mm focus of diffusion abnormality involving the subcortical white matter of the right parietal corona radiata noted, likely a small acute to subacute small vessel type ischemic infarct (series 5, image 74). No associated hemorrhage or mass effect. No other diffusion abnormality to suggest acute or subacute ischemia. Gray-white matter differentiation otherwise maintained. No mass lesion, mass effect, or midline shift. No hydrocephalus or extra-axial fluid collection. Pituitary gland suprasellar region normal. Midline structures intact. 1.3 cm ovoid well-circumscribed lesion positioned at the left petrous apex demonstrating heterogeneous T2/FLAIR signal intensity with hyperintense T1 signal intensity noted (series 16, image 14), nonspecific, but could reflect a small cholesterol granuloma versus asymmetric fatty marrow. Finding is relatively stable from previous MRI from 2018, and of doubtful significance. Vascular: Major intracranial vascular flow voids are maintained. Skull and upper cervical spine: Craniocervical junction within normal limits. Multilevel degenerative spondylosis noted within the visualized upper cervical spine with resultant mild-to-moderate spinal stenosis. Bone marrow signal intensity within normal limits. No scalp soft tissue abnormality. Sinuses/Orbits: Patient status post bilateral ocular lens replacement. Globes and orbital soft tissues demonstrate no acute finding. Mild scattered mucosal thickening noted throughout the paranasal sinuses. Trace bilateral mastoid effusions, of  doubtful significance. Visualized nasopharynx within normal limits. Other: None. MRA HEAD FINDINGS ANTERIOR CIRCULATION: Examination degraded by motion artifact. Visualized distal cervical segments of the internal carotid arteries are patent with symmetric antegrade flow. Petrous segments widely patent. Atheromatous irregularity throughout the carotid siphons with associated mild multifocal narrowing. A1 segments patent bilaterally. Normal anterior communicating artery complex. Anterior cerebral arteries patent to their distal aspects without high-grade stenosis. Short-segment severe stenosis at the distal left M1 segment/left MCA bifurcation (series 1036, image 12). Left MCA branches are diffusely irregular but perfused distally. Right M1 segment patent proximally. Focal signal cutoff at the mid-distal right M1 segment into the bifurcation (series 1036, image 11). This likely reflects a severe high-grade stenosis, as the right MCA branches are perfused distally. Diffuse atheromatous irregularity seen throughout the right MCA branches as well. Proximally. POSTERIOR CIRCULATION: Vertebral arteries largely code dominant and patent to the vertebrobasilar junction. Both PICA origins patent and normal. Basilar patent to its distal aspect without stenosis. Superior cerebral arteries patent bilaterally. Both PCAs  primarily supplied via the basilar. Severe long segment stenosis involving the mid-distal right P2 segment (series 1046, image 15). Severe multifocal distal left P2 and P3 stenoses present as well (series 1046, image 14). PCAs remain perfused to their distal aspects. No intracranial aneurysm or other vascular abnormality. IMPRESSION: MRI HEAD IMPRESSION: 1. Unchanged 1 cm hemorrhage centered at the posterior right lentiform nucleus/right internal capsule. No significant surrounding edema or regional mass effect. 2. 4 mm focus of diffusion abnormality involving the subcortical white matter of the right parietal  corona radiata, consistent with a small acute to subacute small vessel type ischemic infarct. No associated hemorrhage. 3. Underlying age-related cerebral atrophy with moderate chronic microvascular ischemic disease, mildly progressed as compared to 2018. MRA HEAD IMPRESSION: 1. Technically limited exam due to motion artifact. 2. Negative intracranial MRA for large vessel occlusion. 3. Focal severe high-grade stenoses involving the mid-distal right M1 segment and distal left M1 segment/left MCA bifurcation. Left MCA branches remain perfused distally. 4. Severe multifocal bilateral P2 and distal left P3 stenoses as above. Electronically Signed   By: Jeannine Boga M.D.   On: 07/13/2020 19:16   MR BRAIN WO CONTRAST  Result Date: 07/13/2020 CLINICAL DATA:  Follow-up examination for acute stroke. EXAM: MRI HEAD WITHOUT CONTRAST MRA HEAD WITHOUT CONTRAST TECHNIQUE: Multiplanar, multiecho pulse sequences of the brain and surrounding structures were obtained without intravenous contrast. Angiographic images of the head were obtained using MRA technique without contrast. COMPARISON:  Prior head CT from 07/12/2020 as well as previous brain MRI from 08/17/2016. FINDINGS: MRI HEAD FINDINGS Brain: Diffuse prominence of the CSF containing spaces compatible with generalized age-related cerebral atrophy. Patchy and confluent T2/FLAIR hyperintensity within the periventricular and deep white matter both cerebral hemispheres most likely related chronic microvascular ischemic disease. Patchy involvement of the pons. Overall, these changes are mildly progressed as compared to 2018. Few small remote lacunar type infarcts noted about the basal ganglia, most notable which present at the right caudothalamic groove. Previously identified hemorrhage centered at the posterior right lentiform nucleus/right internal capsule again seen, stable in size measuring up to approximately 1 cm in greatest dimension. This is somewhat difficult  to visualized by MR, and is most notable on T1 weighted sequence (series 16, image 26). No significant surrounding edema or regional mass effect. No other acute intracranial hemorrhage. Few scattered chronic micro hemorrhages noted at the right cerebellum and pons, likely small vessel/hypertensive in nature. Single punctate 4 mm focus of diffusion abnormality involving the subcortical white matter of the right parietal corona radiata noted, likely a small acute to subacute small vessel type ischemic infarct (series 5, image 74). No associated hemorrhage or mass effect. No other diffusion abnormality to suggest acute or subacute ischemia. Gray-white matter differentiation otherwise maintained. No mass lesion, mass effect, or midline shift. No hydrocephalus or extra-axial fluid collection. Pituitary gland suprasellar region normal. Midline structures intact. 1.3 cm ovoid well-circumscribed lesion positioned at the left petrous apex demonstrating heterogeneous T2/FLAIR signal intensity with hyperintense T1 signal intensity noted (series 16, image 14), nonspecific, but could reflect a small cholesterol granuloma versus asymmetric fatty marrow. Finding is relatively stable from previous MRI from 2018, and of doubtful significance. Vascular: Major intracranial vascular flow voids are maintained. Skull and upper cervical spine: Craniocervical junction within normal limits. Multilevel degenerative spondylosis noted within the visualized upper cervical spine with resultant mild-to-moderate spinal stenosis. Bone marrow signal intensity within normal limits. No scalp soft tissue abnormality. Sinuses/Orbits: Patient status post bilateral ocular lens replacement. Globes and  orbital soft tissues demonstrate no acute finding. Mild scattered mucosal thickening noted throughout the paranasal sinuses. Trace bilateral mastoid effusions, of doubtful significance. Visualized nasopharynx within normal limits. Other: None. MRA HEAD  FINDINGS ANTERIOR CIRCULATION: Examination degraded by motion artifact. Visualized distal cervical segments of the internal carotid arteries are patent with symmetric antegrade flow. Petrous segments widely patent. Atheromatous irregularity throughout the carotid siphons with associated mild multifocal narrowing. A1 segments patent bilaterally. Normal anterior communicating artery complex. Anterior cerebral arteries patent to their distal aspects without high-grade stenosis. Short-segment severe stenosis at the distal left M1 segment/left MCA bifurcation (series 1036, image 12). Left MCA branches are diffusely irregular but perfused distally. Right M1 segment patent proximally. Focal signal cutoff at the mid-distal right M1 segment into the bifurcation (series 1036, image 11). This likely reflects a severe high-grade stenosis, as the right MCA branches are perfused distally. Diffuse atheromatous irregularity seen throughout the right MCA branches as well. Proximally. POSTERIOR CIRCULATION: Vertebral arteries largely code dominant and patent to the vertebrobasilar junction. Both PICA origins patent and normal. Basilar patent to its distal aspect without stenosis. Superior cerebral arteries patent bilaterally. Both PCAs primarily supplied via the basilar. Severe long segment stenosis involving the mid-distal right P2 segment (series 1046, image 15). Severe multifocal distal left P2 and P3 stenoses present as well (series 1046, image 14). PCAs remain perfused to their distal aspects. No intracranial aneurysm or other vascular abnormality. IMPRESSION: MRI HEAD IMPRESSION: 1. Unchanged 1 cm hemorrhage centered at the posterior right lentiform nucleus/right internal capsule. No significant surrounding edema or regional mass effect. 2. 4 mm focus of diffusion abnormality involving the subcortical white matter of the right parietal corona radiata, consistent with a small acute to subacute small vessel type ischemic infarct.  No associated hemorrhage. 3. Underlying age-related cerebral atrophy with moderate chronic microvascular ischemic disease, mildly progressed as compared to 2018. MRA HEAD IMPRESSION: 1. Technically limited exam due to motion artifact. 2. Negative intracranial MRA for large vessel occlusion. 3. Focal severe high-grade stenoses involving the mid-distal right M1 segment and distal left M1 segment/left MCA bifurcation. Left MCA branches remain perfused distally. 4. Severe multifocal bilateral P2 and distal left P3 stenoses as above. Electronically Signed   By: Jeannine Boga M.D.   On: 07/13/2020 19:16   DG Pelvis Portable  Result Date: 07/12/2020 CLINICAL DATA:  Right hip pain.  No reported injury. EXAM: PORTABLE PELVIS 1-2 VIEWS COMPARISON:  07/21/2018 outside pelvic radiographs FINDINGS: Partially visualized left hip hemiarthroplasty and partially visualized fixation hardware in the proximal right femur with no evidence of hardware fracture or loosening. No evidence of hip dislocation on this single frontal view. No osseous fracture. No focal osseous lesions. Moderate right hip osteoarthritis. No suspicious focal osseous lesions. No pelvic diastasis. IMPRESSION: No acute osseous abnormality. No evidence of hip dislocation on this single frontal view. Moderate right hip osteoarthritis. Partially visualized left hip hemiarthroplasty and proximal right femur fixation hardware, with no evidence of hardware complication. Electronically Signed   By: Ilona Sorrel M.D.   On: 07/12/2020 12:33   DG CHEST PORT 1 VIEW  Result Date: 07/15/2020 CLINICAL DATA:  COVID-19. EXAM: PORTABLE CHEST 1 VIEW COMPARISON:  07/12/2020. FINDINGS: Right dual-lumen catheter stable position. Stable cardiomegaly. Progressive bilateral pulmonary infiltrates/edema. Tiny bilateral pleural effusions cannot be excluded. No pneumothorax. Degenerative change thoracic spine. Surgical clips right chest. IMPRESSION: 1. Right dual-lumen  catheter in stable position. 2. Stable cardiomegaly. 3. Progressive bilateral pulmonary infiltrates/edema. Tiny bilateral pleural effusions cannot be excluded.  Electronically Signed   By: Marcello Moores  Register   On: 07/15/2020 05:24   DG Abd Portable 1V  Result Date: 07/13/2020 CLINICAL DATA:  Evaluate orogastric tube EXAM: PORTABLE ABDOMEN - 1 VIEW COMPARISON:  July 13, 2020 FINDINGS: The OG tube side port and distal tip are in the region the stomach. IMPRESSION: The OG tube is in good position. Electronically Signed   By: Dorise Bullion III M.D   On: 07/13/2020 11:04   DG Abd Portable 1V  Result Date: 07/13/2020 CLINICAL DATA:  Vomiting. EXAM: PORTABLE ABDOMEN - 1 VIEW COMPARISON:  None. FINDINGS: No evidence of dilated bowel loops. Aortic atherosclerotic calcification noted. Compression screw noted in the right hip. Left hip prosthesis is also seen. IMPRESSION: Unremarkable bowel gas pattern.  No acute findings. Electronically Signed   By: Marlaine Hind M.D.   On: 07/13/2020 05:08   ECHOCARDIOGRAM COMPLETE  Result Date: 07/14/2020    ECHOCARDIOGRAM REPORT   Patient Name:   AIDALY CORDNER Date of Exam: 07/14/2020 Medical Rec #:  [id number]     Height:       64.0 in Accession #:    [id number]    Weight:       174.6 lb Date of Birth:  1941-05-22      BSA:          1.847 m Patient Age:    67 years      BP:           114/75 mmHg Patient Gender: F             HR:           77 bpm. Exam Location:  Inpatient Procedure: 2D Echo, Color Doppler and Cardiac Doppler Indications:    Stroke 434.91 / I163.9  History:        Patient has no prior history of Echocardiogram examinations.                 Risk Factors:Dyslipidemia and Diabetes.  Sonographer:    Bernadene Person RDCS Referring Phys: [place]  1. Left ventricular ejection fraction, by estimation, is 60 to 65%. The left ventricle has normal function. The left ventricle has no regional wall motion abnormalities. There is moderate left  ventricular hypertrophy. Left ventricular diastolic parameters are consistent with Grade I diastolic dysfunction (impaired relaxation). Elevated left ventricular end-diastolic pressure. The E/e' is 20.  2. Right ventricular systolic function is normal. The right ventricular size is normal.  3. The pericardial effusion is posterior to the left ventricle.  4. The mitral valve is abnormal. Mild mitral valve regurgitation. Moderate mitral annular calcification.  5. The aortic valve is tricuspid. Aortic valve regurgitation is not visualized. Mild aortic valve sclerosis is present, with no evidence of aortic valve stenosis. Comparison(s): No prior Echocardiogram. FINDINGS  Left Ventricle: Left ventricular ejection fraction, by estimation, is 60 to 65%. The left ventricle has normal function. The left ventricle has no regional wall motion abnormalities. The left ventricular internal cavity size was normal in size. There is  moderate left ventricular hypertrophy. Left ventricular diastolic parameters are consistent with Grade I diastolic dysfunction (impaired relaxation). Elevated left ventricular end-diastolic pressure. The E/e' is 20. Right Ventricle: The right ventricular size is normal. No increase in right ventricular wall thickness. Right ventricular systolic function is normal. Left Atrium: Left atrial size was normal in size. Right Atrium: Right atrial size was normal in size. Pericardium: Trivial pericardial effusion is present. The pericardial effusion is  posterior to the left ventricle. Mitral Valve: The mitral valve is abnormal. There is mild thickening of the mitral valve leaflet(s). Moderate mitral annular calcification. Mild mitral valve regurgitation, with posteriorly-directed jet. Tricuspid Valve: The tricuspid valve is grossly normal. Tricuspid valve regurgitation is trivial. Aortic Valve: The aortic valve is tricuspid. Aortic valve regurgitation is not visualized. Mild aortic valve sclerosis is present,  with no evidence of aortic valve stenosis. Pulmonic Valve: The pulmonic valve was normal in structure. Pulmonic valve regurgitation is trivial. Aorta: The aortic root and ascending aorta are structurally normal, with no evidence of dilitation. Venous: The inferior vena cava was not well visualized. IAS/Shunts: No atrial level shunt detected by color flow Doppler.  LEFT VENTRICLE PLAX 2D LVIDd:         4.30 cm  Diastology LVIDs:         2.80 cm  LV e' medial:    5.66 cm/s LV PW:         1.20 cm  LV E/e' medial:  21.9 LV IVS:        1.20 cm  LV e' lateral:   6.20 cm/s LVOT diam:     2.10 cm  LV E/e' lateral: 20.0 LV SV:         103 LV SV Index:   56 LVOT Area:     3.46 cm  RIGHT VENTRICLE RV S prime:     13.90 cm/s TAPSE (M-mode): 1.8 cm LEFT ATRIUM             Index       RIGHT ATRIUM           Index LA diam:        3.30 cm 1.79 cm/m  RA Area:     13.60 cm LA Vol (A2C):   51.0 ml 27.62 ml/m RA Volume:   28.50 ml  15.43 ml/m LA Vol (A4C):   50.1 ml 27.13 ml/m LA Biplane Vol: 51.9 ml 28.10 ml/m  AORTIC VALVE LVOT Vmax:   143.00 cm/s LVOT Vmean:  98.200 cm/s LVOT VTI:    0.296 m  AORTA Ao Root diam: 3.00 cm Ao Asc diam:  3.40 cm MITRAL VALVE                TRICUSPID VALVE MV Area (PHT): 3.21 cm     TR Peak grad:   23.2 mmHg MV Decel Time: 236 msec     TR Vmax:        241.00 cm/s MV E velocity: 124.00 cm/s MV A velocity: 120.00 cm/s  SHUNTS MV E/A ratio:  1.03         Systemic VTI:  0.30 m                             Systemic Diam: 2.10 cm Lyman Bishop MD Electronically signed by Lyman Bishop MD Signature Date/Time: 07/14/2020/2:10:04 PM    Final    CT HEAD CODE STROKE WO CONTRAST  Addendum Date: 07/12/2020   ADDENDUM REPORT: 07/12/2020 10:16 ADDENDUM: Correction.  Findings were discussed with Dr. Theda Sers. Electronically Signed   By: Margaretha Sheffield MD   On: 07/12/2020 10:16   Result Date: 07/12/2020 CLINICAL DATA:  Code stroke.  Neuro deficit, acute stroke suspected. EXAM: CT HEAD WITHOUT CONTRAST  TECHNIQUE: Contiguous axial images were obtained from the base of the skull through the vertex without intravenous contrast. COMPARISON:  MRI head August 17, 2016 FINDINGS: Brain: Acute hemorrhage  in the right basal ganglia, centered in the region of the posterior putamen/posterior limb of the internal capsule. The hemorrhage measures approximately 7 x 4 by 6 mm. No evidence of acute large vascular territory infarct. Patchy white matter hypoattenuation, which is nonspecific but most likely related to chronic microvascular ischemic disease. Mild generalized atrophy. No hydrocephalus. No evidence of a mass lesion. No midline shift. Remote right cerebellar lacunar infarct. Vascular: Calcific atherosclerosis. Skull: No acute fracture. Sinuses/Orbits: Sinuses are clear.  Unremarkable orbits. Other: No mastoid effusion. IMPRESSION: 1. Acute 7 mm hemorrhage within the right basal ganglia. No substantial mass effect. 2. Chronic microvascular ischemic disease and remote right cerebellar lacunar infarct. Code stroke imaging results were communicated on 07/12/2020 at 10:00 am to provider Dr. Lorrin Goodell via telephone, who verbally acknowledged these results. Electronically Signed: By: Margaretha Sheffield MD On: 07/12/2020 10:05   VAS US CAROTID  Result Date: 07/14/2020 Carotid Arterial Duplex Study Indications:       CVA. Risk Factors:      Hyperlipidemia, Diabetes. Limitations        Today's exam was limited due to the patient's inability or                    unwillingness to cooperate. Comparison Study:  no prior Performing Technologist: Abram Sander RVS  Examination Guidelines: A complete evaluation includes B-mode imaging, spectral Doppler, color Doppler, and power Doppler as needed of all accessible portions of each vessel. Bilateral testing is considered an integral part of a complete examination. Limited examinations for reoccurring indications may be performed as noted.  Right Carotid Findings:  +----------+--------+--------+--------+------------------+--------------+           PSV cm/sEDV cm/sStenosisPlaque DescriptionComments       +----------+--------+--------+--------+------------------+--------------+ CCA Prox  54      9               heterogenous                     +----------+--------+--------+--------+------------------+--------------+ CCA Distal65      16              heterogenous                     +----------+--------+--------+--------+------------------+--------------+ ICA Prox  48      17      1-39%   heterogenous                     +----------+--------+--------+--------+------------------+--------------+ ICA Distal                                          Not visualized +----------+--------+--------+--------+------------------+--------------+ ECA       91      9                                                +----------+--------+--------+--------+------------------+--------------+ +----------+--------+-------+--------------------------+-------------------+           PSV cm/sEDV cmsDescribe                  Arm Pressure (mmHG) +----------+--------+-------+--------------------------+-------------------+ Subclavian               not visualized due to port                    +----------+--------+-------+--------------------------+-------------------+ +---------+--------+--------+--------------+  [place] cm/sEDV cm/sNot identified +---------+--------+--------+--------------+  Left Carotid Findings: +----------+--------+--------+--------+------------------+--------------+           PSV cm/sEDV cm/sStenosisPlaque DescriptionComments       +----------+--------+--------+--------+------------------+--------------+ CCA Prox  53      5               heterogenous                     +----------+--------+--------+--------+------------------+--------------+ CCA Distal73      10              heterogenous                      +----------+--------+--------+--------+------------------+--------------+ ICA Prox  127     16      1-39%   heterogenous                     +----------+--------+--------+--------+------------------+--------------+ ICA Distal                                          Not visualized +----------+--------+--------+--------+------------------+--------------+ ECA       220                                                      +----------+--------+--------+--------+------------------+--------------+ +----------+--------+--------+--------------+-------------------+           PSV cm/sEDV cm/sDescribe      Arm Pressure (mmHG) +----------+--------+--------+--------------+-------------------+ Subclavian                Not identified                    +----------+--------+--------+--------------+-------------------+ +---------+--------+--------+--------------+ VertebralPSV cm/sEDV cm/sNot identified +---------+--------+--------+--------------+   Summary: Right Carotid: Velocities in the right ICA are consistent with a 1-39% stenosis. Left Carotid: Velocities in the left ICA are consistent with a 1-39% stenosis. Vertebrals: Bilateral vertebral arteries were not visualized. *See table(s) above for measurements and observations.  Electronically signed by Antony Contras MD on 07/14/2020 at 1:04:27 PM.    Final

## 2020-07-20 NOTE — Progress Notes (Signed)
°   07/20/20 0417  Hand-Off documentation  Handoff Given Given to shift RN/LPN  Report given to (Full Name) Pincus Badder, RN  Handoff Received Received from shift RN/LPN  Report received from (Full Name) Aoki Wedemeyer, RN  Vitals  Temp 98.4 F (36.9 C)  Temp Source Oral  BP (!) 151/71  MAP (mmHg) 95  BP Location Right Arm  BP Method Automatic  Patient Position (if appropriate) Lying  Pulse Rate 73  Pulse Rate Source Monitor  ECG Heart Rate 74  Resp 18  Oxygen Therapy  SpO2 95 %  O2 Device Room Air  Pain Assessment  Pain Scale 0-10  Pain Score 6  Faces Pain Scale 4  Pain Type Acute pain  Pain Location Hip  Pain Orientation Right  Pain Descriptors / Indicators Aching  Pain Frequency Intermittent  Pain Onset On-going  Pain Intervention(s) Emotional support  Post-Hemodialysis Assessment  Rinseback Volume (mL) 250 mL  KECN 254 V  Dialyzer Clearance Lightly streaked  Duration of HD Treatment -hour(s) 3 hour(s)  Hemodialysis Intake (mL) 700 mL  UF Total -Machine (mL) 1700 mL  Net UF (mL) 1000 mL  Tolerated HD Treatment Yes  Post-Hemodialysis Comments tx achieved as scheduled with one episode on hypotensive which was corrected. Tolerated fairly.  c/o unble to breath once. has been on RA, coughing at times. pt is stable.  AVG/AVF Arterial Site Held (minutes) 0 minutes  AVG/AVF Venous Site Held (minutes) 0 minutes  Education / Care Plan  Dialysis Education Provided Yes  Documented Education in Care Plan Yes  Hemodialysis Catheter Right Internal jugular Double lumen Permanent (Tunneled)  Placement Date/Time: 07/12/20 1753   Placed prior to admission: Yes  Orientation: Right  Access Location: Internal jugular  Hemodialysis Catheter Type: Double lumen Permanent (Tunneled)  Site Condition No complications  Blue Lumen Status Heparin locked;Capped (Central line)  Red Lumen Status Heparin locked;Capped (Central line)  Catheter fill solution Heparin 1000 units/ml  Catheter fill  volume (Arterial) 1.9 cc  Catheter fill volume (Venous) 1.9  Dressing Type Occlusive  Dressing Status Clean;Dry;Intact  Drainage Description None  Dressing Change Due 07/21/20  Post treatment catheter status Capped and Clamped

## 2020-07-20 NOTE — Progress Notes (Signed)
Patient ID: [name], female   DOB: [birth year], 79 y.o.   MRN: [id number] [facility] Progress Note   Assessment/ Plan:   1.  COVID-19 infection: This was diagnosed on 12/13 and she underwent monoclonal antibody treatment on 12/17.  She was unvaccinated.  Clinically appears to be stable and without any evidence of pneumonia/hypoxia. 2.  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  With some residual right hand weakness noted. 3. ESRD: She is usually on a Monday/Wednesday/Friday dialysis schedule and has been off schedule due to various delays from high patient volume and staffing.  I will go ahead and order dialysis again for tomorrow to return her back to her usual outpatient schedule. 4.  GI bleed with acute blood loss anemia: Status post PRBC transfusion earlier with improvement of hemoglobin and hemodynamic status.  Underwent EGD yesterday showing gastritis and duodenitis without any focal bleeding.  Ongoing medical management with PPI. 5. CKD-MBD: Elevated phosphorus level noted, will continue to trend this during hospitalization.  Continue binders/VDRA. 6. Nutrition: Resumed renal diet with fluid restriction 7.  Paroxysmal atrial fibrillation with rapid ventricular response: Back in sinus rhythm/rate controlled and now back on Eliquis following transient heparin drip.  Subjective:   Without any acute events overnight and had her dialysis/delayed and completed earlier this morning.   Objective:   BP 138/62 (BP Location: Right Arm)   Pulse 85   Temp 99 F (37.2 C) (Oral)   Resp 16   Ht 5\' 4"  (1.626 m)   Wt 74.6 kg   SpO2 95%   BMI 28.23 kg/m   Physical Exam: Patient not personally examined to conserve PPE and limit direct contact with COVID-19 patient.  I discussed the pertinent/relevant elements  of physical exam findings with Dr. Candiss Norse.  Labs: BMET Recent Labs  Lab 07/14/20 0232 07/15/20 0251 07/16/20 0146 07/17/20 0152 07/18/20 0050 07/19/20 0200  NA 141 138 138 138 137 135  K 4.3 3.9 3.0* 3.6 3.9 4.0  CL 105 101 100 98 98 96*  CO2 16* 21* 24 23 25 23   GLUCOSE 122* 89 86 221* 140* 133*  BUN 90* 41* 24* 37* 17 34*  CREATININE 6.73* 4.79* 4.12* 5.93* 4.37* 6.30*  CALCIUM 8.0* 7.7* 7.5* 8.3* 8.6* 8.7*  PHOS 8.6*  --   --   --   --   --    CBC Recent Labs  Lab 07/17/20 0152 07/18/20 0050 07/19/20 0200 07/20/20 0524  WBC 5.8 5.1 5.1 5.0  NEUTROABS 4.3 3.1 2.9 3.0  HGB 11.0* 12.0 11.5* 11.0*  HCT 33.6* 37.7 36.3 34.7*  MCV 89.4 90.8 90.8 90.6  PLT 136* 130* 157 176      Medications:    . amiodarone  200 mg Oral Daily  . apixaban  2.5 mg Oral BID  . aspirin EC  81 mg Oral Daily  . atorvastatin  20 mg Oral QHS  . chlorhexidine  15 mL Mouth Rinse BID  . Chlorhexidine Gluconate Cloth  6 each Topical Daily  . Chlorhexidine Gluconate Cloth  6 each Topical Q0600  . diltiazem  120 mg Oral Daily  . heparin sodium (porcine)      . insulin aspart  0-6 Units Subcutaneous Q4H  . mouth rinse  15 mL Mouth Rinse q12n4p  . midodrine  10 mg Oral Once  . pantoprazole  40 mg  Oral BID   Elmarie Shiley, MD 07/20/2020, 11:43 AM

## 2020-07-20 NOTE — Progress Notes (Signed)
Patient has returned from dialysis.

## 2020-07-21 ENCOUNTER — Other Ambulatory Visit: Payer: Self-pay | Admitting: Physician Assistant

## 2020-07-21 LAB — BASIC METABOLIC PANEL
Anion gap: 12 (ref 5–15)
BUN: 27 mg/dL — ABNORMAL HIGH (ref 8–23)
CO2: 24 mmol/L (ref 22–32)
Calcium: 8.6 mg/dL — ABNORMAL LOW (ref 8.9–10.3)
Chloride: 97 mmol/L — ABNORMAL LOW (ref 98–111)
Creatinine, Ser: 5.38 mg/dL — ABNORMAL HIGH (ref 0.44–1.00)
GFR, Estimated: 8 mL/min — ABNORMAL LOW (ref >60)
Glucose, Bld: 158 mg/dL — ABNORMAL HIGH (ref 70–99)
Potassium: 4.5 mmol/L (ref 3.5–5.1)
Sodium: 133 mmol/L — ABNORMAL LOW (ref 135–145)

## 2020-07-21 LAB — GLUCOSE, CAPILLARY
Glucose-Capillary: 149 mg/dL — ABNORMAL HIGH (ref 70–99)
Glucose-Capillary: 164 mg/dL — ABNORMAL HIGH (ref 70–99)
Glucose-Capillary: 112 mg/dL — ABNORMAL HIGH (ref 70–99)
Glucose-Capillary: 225 mg/dL — ABNORMAL HIGH (ref 70–99)
Glucose-Capillary: 224 mg/dL — ABNORMAL HIGH (ref 70–99)
Glucose-Capillary: 166 mg/dL — ABNORMAL HIGH (ref 70–99)

## 2020-07-21 LAB — CBC WITH DIFFERENTIAL/PLATELET
Abs Immature Granulocytes: 0.17 10*3/uL — ABNORMAL HIGH (ref 0.00–0.07)
Basophils Absolute: 0 10*3/uL (ref 0.0–0.1)
Basophils Relative: 0 %
Eosinophils Absolute: 0.1 10*3/uL (ref 0.0–0.5)
Eosinophils Relative: 2 %
HCT: 33.4 % — ABNORMAL LOW (ref 36.0–46.0)
Hemoglobin: 10.3 g/dL — ABNORMAL LOW (ref 12.0–15.0)
Immature Granulocytes: 3 %
Lymphocytes Relative: 19 %
Lymphs Abs: 0.9 10*3/uL (ref 0.7–4.0)
MCH: 28.2 pg (ref 26.0–34.0)
MCHC: 30.8 g/dL (ref 30.0–36.0)
MCV: 91.5 fL (ref 80.0–100.0)
Monocytes Absolute: 0.9 10*3/uL (ref 0.1–1.0)
Monocytes Relative: 17 %
Neutro Abs: 2.9 10*3/uL (ref 1.7–7.7)
Neutrophils Relative %: 59 %
Platelets: 194 10*3/uL (ref 150–400)
RBC: 3.65 MIL/uL — ABNORMAL LOW (ref 3.87–5.11)
RDW: 17 % — ABNORMAL HIGH (ref 11.5–15.5)
WBC: 5 10*3/uL (ref 4.0–10.5)
nRBC: 0 % (ref 0.0–0.2)

## 2020-07-21 LAB — SARS CORONAVIRUS 2 (TAT 6-24 HRS): SARS Coronavirus 2: POSITIVE — AB

## 2020-07-21 MED ORDER — ALBUMIN HUMAN 25 % IV SOLN
INTRAVENOUS | Status: AC
  Administered 2020-07-21: 12.5 g
  Filled 2020-07-21: qty 100

## 2020-07-21 NOTE — Progress Notes (Signed)
Renal Navigator discussed plan of care with TOC CSW/N. Rayyan. Navigator requested updated COVID test be ordered by Nephrologist as a negative would greatly assist with discharge planning, though chances are patient may still be testing positive at this point. Patient's outpatient clinic staff has also asked Navigator to request an updated test in the event that she may test negative by this time. Dr. Jonnie Finner agreed and this is very much appreciated.  In the event that patient continues to test positive, Navigator has discussed patient with [facility] Director of [place], who will know where there may be a COVID shift available in [place] to accommodate patient's discharge to SNF. Of note, all COVID shifts are running on 3rd shift at this time. If patient tests negative, Navigator has asked CSW to evaluate SNFs in [place] to accommodate patient and clinic has already stated that they will convert her seat to MWF for this.  Navigator will await follow up from Ms. Ware and COVID test result from test that was ordered today.  Alphonzo Cruise, [place] Renal Navigator [phone]

## 2020-07-21 NOTE — Progress Notes (Signed)
PROGRESS NOTE                                                                                                                                                                                                             Patient Demographics:    [name], is a 79 y.o. female, DOB - [birth year], YHC:[id number]  Outpatient Primary MD for the patient is [name], MD    LOS - 9  Admit date - 07/12/2020    Chief Complaint  Patient presents with  . Code Stroke  . GI Bleeding       Brief Narrative (HPI from H&P) - this is a 78 year old female with history of hypertension, lateral hip fractures, right-sided breast cancer s/p treatment including lumpectomy about 5 years ago, ESRD, dyslipidemia, DM type II, A. fib who was brought in the hospital on 07/12/2020 with altered mental status, was found to have ischemic stroke with hemorrhagic conversion, her stay was complicated by GI bleed and COVID-19 infection.  She was initially admitted under PCCM and was transferred to hospitalist service on 07/15/2020.  She has been seen so far by nephrology, neurology and PCCM.   Subjective:   Patient in bed, appears comfortable, denies any headache, no fever, no chest pain or pressure, no shortness of breath , no abdominal pain. No focal weakness, he wants to be discharged home tomorrow with home health rather than SNF.   Assessment  & Plan :     1. Acute right basal ganglia hemorrhagic stroke- with minimal left-sided weakness, this appears to be ischemic infarct with hemorrhagic conversion due to A. Fib.  Seen by stroke team, case discussed with Dr. Leonie Man on 07/15/2020.  For now baby aspirin along with home dose statin.  LDL was within acceptable limits, echocardiogram nonacute, A1c slightly elevated will defer for PCP for better glycemic control.  Continue PT-OT and speech therapy.  Resume anticoagulation when stable from GI standpoint.  Repeat  noncontrast head CT 07/16/2020 as she has some grip issues in the right hand off and on, discussed with Dr. Leonie Man no new recommendations, right hand issues almost completely resolved.  2.  A. fib RVR.  Likely found this admission.  She is currently in and out of A. fib and RVR, have started her on Cardizem, amiodarone added by cardiology, stable TSH & echocardiogram noted  with preserved EF.  See anticoagulation as in #3 below.  3. Acute GI bleed with blood loss related anemia.  Appears to be upper GI.  S/p packed RBC transfusion on 07/12/2020 1 unit, monitor H&H, PPI, GI on board and she underwent EGD on 07/17/2020, discussed the case with Dr. Nicki Guadalajara and Dr. Leonie Man neurology, okay for heparin drip which was started on 07/18/2020 along with twice daily PPI.  No further bleeding and switched to Eliquis on 07/19/2020.  4.  ESRD.  On MWF schedule.  Case discussed with Dr. Hollie Salk nephrologist, might get an extra done on 07/15/2020  5.  Dyslipidemia.  On statin.  6.  Essential hypertension.  Diltiazem added hydralazine for better control.  7.  Mild metabolic encephalopathy.  Worsening due to recent stroke.  Supportive care.  8.  COVID-19 infection diagnosed on 07/07/2020.  S/p antibody infusion.  Monitor.    9.  History of right-sided breast cancer.  Outpatient follow-up with oncology doses 57-year-old problem.    10.  Hip pain.  Chronic.  CT scan stable.  Monitor with supportive care.  PT OT.    11. DM type II.  On sliding scale monitor and adjust. DM and Insulin education.  Lab Results  Component Value Date   HGBA1C 7.9 (H) 07/13/2020    CBG (last 3)  Recent Labs    07/20/20 2351 07/21/20 0407 07/21/20 0800  GLUCAP 173* 149* 164*    Lab Results  Component Value Date   CHOL 99 07/14/2020   HDL 31 (L) 07/14/2020   LDLCALC 32 07/14/2020   TRIG 178 (H) 07/14/2020   CHOLHDL 3.2 07/14/2020    Lab Results  Component Value Date   HGBA1C 7.9 (H) 07/13/2020      Lab  Results  Component Value Date   TSH 1.219 07/16/2020        Condition - Extremely Guarded  Family Communication  :   Husband [name] [phone] on 07/15/2020 message left at 11:36 AM.    [name] [phone] on 07/15/2020,  07/16/20, 07/17/20 message left at 2pm, 07/18/2020, 07/19/20, 07/20/20 message left at 9:52 AM (family to decide on placement.), left message on 07/21/2020 at 8:15 AM  Code Status :  Full  Consults  : Neurology, PCCM, nephrology, cardiology  Procedures  :    EGD 12/23 - gastritis and duodenitis.  CT HEAD  07/12/2020  -   Acute 7 mm hemorrhage within the right basal ganglia. No substantial mass effect. Chronic microvascular ischemic disease and remote right cerebellar lacunar infarct.   CT HEAD WO CONTRAST - 07/13/2020 expected evolutionary changes in the acute right basal ganglia hemorrhage overall similar in size with slightly decreased density compared to previous CT.  No new abnormality.  MRI / MRA Head - unchanged 1 cm right posterior lentiform nucleus/internal capsule hemorrhage without significant surrounding edema or mass-effect.  4 mm diffusion abnormality involving subcortical white matter in the right parietal corona radiata consistent with acute small vessel type infarct.    MRA of the brain technically limited due to motion artifact but no large vessel occlusion.  Focal high-grade stenosis of the right distal M1 segment and distal left M1 segment bifurcation.  Severe multifocal bilateral P2 and distal left P3 stenosis.  Transthoracic Echocardiogram - left ventricular ejection fraction 60 to 65%.  00/00/2021  Bilateral Carotid Dopplers - bilateral 1-39% stenosis of carotids.    PUD Prophylaxis : PPI  Disposition Plan  :    Status is: Inpatient  Remains inpatient  appropriate because:IV treatments appropriate due to intensity of illness or inability to take PO   Dispo: The patient is from: Home              Anticipated d/c  is to: SNF              Anticipated d/c date is: > 3 days              Patient currently is not medically stable to d/c.   DVT Prophylaxis  :   SCDs >> Heparin   Lab Results  Component Value Date   PLT 194 07/21/2020    Diet :  Diet Order            DIET SOFT Room service appropriate? Yes; Fluid consistency: Thin  Diet effective now                  Inpatient Medications  Scheduled Meds: . amiodarone  200 mg Oral Daily  . apixaban  2.5 mg Oral BID  . aspirin EC  81 mg Oral Daily  . atorvastatin  20 mg Oral QHS  . chlorhexidine  15 mL Mouth Rinse BID  . Chlorhexidine Gluconate Cloth  6 each Topical Daily  . Chlorhexidine Gluconate Cloth  6 each Topical Q0600  . diltiazem  120 mg Oral Daily  . insulin aspart  0-6 Units Subcutaneous Q4H  . mouth rinse  15 mL Mouth Rinse q12n4p  . midodrine  10 mg Oral Once  . pantoprazole  40 mg Oral BID   Continuous Infusions: . sodium chloride Stopped (07/17/20 1515)  . albumin human     PRN Meds:.sodium chloride, docusate sodium, guaiFENesin-dextromethorphan, heparin sodium (porcine), hydrALAZINE, metoprolol tartrate, ondansetron (ZOFRAN) IV, polyethylene glycol, sodium chloride flush  Antibiotics  :    Anti-infectives (From admission, onward)   None       Time Spent in minutes  30   Lala Lund M.D on 07/21/2020 at 8:15 AM  To page go to [email or web address]   [facility] -  Office  [phone]   See all Orders from today for further details    Objective:   Vitals:   07/20/20 2041 07/21/20 0300 07/21/20 0500 07/21/20 0527  BP: (!) 152/91   (!) 152/64  Pulse:   70   Resp:    16  Temp: 98.5 F (36.9 C)  98.3 F (36.8 C)   TempSrc: Axillary  Oral   SpO2:  95% 95%   Weight:      Height:        Wt Readings from Last 3 Encounters:  07/18/20 74.6 kg  04/21/20 77.9 kg  04/20/19 90.2 kg     Intake/Output Summary (Last 24 hours) at 07/21/2020 0815 Last data filed at 07/21/2020 0500 Gross per 24 hour   Intake 240 ml  Output 2 ml  Net 238 ml     Physical Exam  Awake Alert, No new F.N deficits, Normal affect [place].AT,PERRAL Supple Neck,No JVD, No cervical lymphadenopathy appriciated.  Symmetrical Chest wall movement, Good air movement bilaterally, CTAB RRR,No Gallops, Rubs or new Murmurs, No Parasternal Heave +ve B.Sounds, Abd Soft, No tenderness, No organomegaly appriciated, No rebound - guarding or rigidity. No Cyanosis, Clubbing or edema, No new Rash or bruise    Data Review:    CBC Recent Labs  Lab 07/17/20 0152 07/18/20 0050 07/19/20 0200 07/20/20 0524 07/21/20 0503  WBC 5.8 5.1 5.1 5.0 5.0  HGB 11.0* 12.0 11.5* 11.0* 10.3*  HCT 33.6* 37.7  36.3 34.7* 33.4*  PLT 136* 130* 157 176 194  MCV 89.4 90.8 90.8 90.6 91.5  MCH 29.3 28.9 28.8 28.7 28.2  MCHC 32.7 31.8 31.7 31.7 30.8  RDW 17.2* 17.2* 17.2* 17.0* 17.0*  LYMPHSABS 0.6* 0.7 0.9 0.9 0.9  MONOABS 0.8 1.1* 1.1* 0.9 0.9  EOSABS 0.0 0.0 0.1 0.1 0.1  BASOSABS 0.0 0.0 0.0 0.0 0.0    Recent Labs  Lab 07/15/20 0251 07/16/20 0146 07/17/20 0152 07/18/20 0050 07/19/20 0200 07/21/20 0503  NA 138 138 138 137 135 133*  K 3.9 3.0* 3.6 3.9 4.0 4.5  CL 101 100 98 98 96* 97*  CO2 21* 24 23 25 23 24   GLUCOSE 89 86 221* 140* 133* 158*  BUN 41* 24* 37* 17 34* 27*  CREATININE 4.79* 4.12* 5.93* 4.37* 6.30* 5.38*  CALCIUM 7.7* 7.5* 8.3* 8.6* 8.7* 8.6*  AST 75* 50* 45* 49* 45*  --   ALT 24 22 23 28 31   --   ALKPHOS 51 49 72 79 80  --   BILITOT 0.8 0.9 0.7 0.9 0.9  --   ALBUMIN 2.2* 2.5* 2.8* 2.8* 2.7*  --   MG 1.9 1.9 2.1 1.8 1.8  --   CRP  --  12.5* 12.1* 8.9* 8.4*  --   DDIMER  --  1.54* 1.86* 2.21* 1.47*  --   TSH  --  1.219  --   --   --   --   BNP  --  325.5* 226.6* 179.8* 113.8*  --     ------------------------------------------------------------------------------------------------------------------ No results for input(s): CHOL, HDL, LDLCALC, TRIG, CHOLHDL, LDLDIRECT in the last 72 hours.  Lab Results   Component Value Date   HGBA1C 7.9 (H) 07/13/2020   ------------------------------------------------------------------------------------------------------------------ No results for input(s): TSH, T4TOTAL, T3FREE, THYROIDAB in the last 72 hours.  Invalid input(s): FREET3  Cardiac Enzymes No results for input(s): CKMB, TROPONINI, MYOGLOBIN in the last 168 hours.  Invalid input(s): CK ------------------------------------------------------------------------------------------------------------------    Component Value Date/Time   BNP 113.8 (H) 07/19/2020 0200    Micro Results Recent Results (from the past 240 hour(s))  MRSA PCR Screening     Status: Abnormal   Collection Time: 07/12/20  6:37 PM   Specimen: Nasal Mucosa; Nasopharyngeal  Result Value Ref Range Status   MRSA by PCR POSITIVE (A) NEGATIVE Final    Comment:        The GeneXpert MRSA Assay (FDA approved for NASAL specimens only), is one component of a comprehensive MRSA colonization surveillance program. It is not intended to diagnose MRSA infection nor to guide or monitor treatment for MRSA infections. RESULT CALLED TO, READ BACK BY AND VERIFIED WITH: J. Dominica Severin 2139 07/12/2020 Mena Goes Performed at [facility], [place] [address], [place], [place] [address]     Radiology Reports DG Chest 1 View  Result Date: 07/12/2020 CLINICAL DATA:  Patient with history of COVID-19. History of GI bleed. EXAM: CHEST  1 VIEW COMPARISON:  Chest radiograph 07/10/2020. FINDINGS: Stable right-sided dialysis catheter. Monitoring leads overlie the patient. Stable cardiomegaly. Aortic atherosclerosis. Site oval increased patchy opacities left mid lower lung. No definite pleural effusion or pneumothorax. Thoracic spine degenerative changes. Surgical clips right axilla. IMPRESSION: Increasing patchy opacities left mid and lower lung may represent infection in the appropriate clinical setting or potentially atelectasis. Electronically  Signed   By: Lovey Newcomer M.D.   On: 07/12/2020 11:24   CT HEAD WO CONTRAST  Result Date: 07/16/2020 CLINICAL DATA:  Stroke follow-up. EXAM:  CT HEAD WITHOUT CONTRAST TECHNIQUE: Contiguous axial images were obtained from the base of the skull through the vertex without intravenous contrast. COMPARISON:  07/13/2020 head CT and MRI FINDINGS: Brain: Approximately 1 cm hyperdense focus at the posterior aspect of the right lentiform nucleus corresponding to the previously demonstrated hemorrhage is unchanged in size. There is no associated edema. No new intracranial hemorrhage, acute large territory infarct, mass, midline shift, or extra-axial fluid collection is identified. Mild cerebral atrophy is within normal limits for age. Hypodensities in the cerebral white matter bilaterally are unchanged and nonspecific but compatible with mild to moderate chronic small vessel ischemic disease. Vascular: Calcified atherosclerosis at the skull base. Skull: No fracture or suspicious osseous lesion. Sinuses/Orbits: Moderate mucosal thickening in the frontal sinuses. Small right mastoid effusion. Bilateral cataract extraction. Other: None. IMPRESSION: 1. Unchanged 1 cm right basal ganglia hemorrhage. 2. No new intracranial abnormality. Electronically Signed   By: Logan Bores M.D.   On: 07/16/2020 15:15   CT HEAD WO CONTRAST  Result Date: 07/13/2020 CLINICAL DATA:  Follow-up examination of basal ganglia hemorrhage. EXAM: CT HEAD WITHOUT CONTRAST TECHNIQUE: Contiguous axial images were obtained from the base of the skull through the vertex without intravenous contrast. COMPARISON:  Prior CT from 07/12/2020 FINDINGS: Brain: Previously identified intraparenchymal hemorrhage at the posterior right lentiform nucleus again seen. Bleed has mildly dispersed and is less dense in appearance as compared to previous exam, measuring overall similar in size at 10 x 7 x 10 mm, previously 10 x 5 x 10 mm when measured in a similar fashion.  No significant surrounding edema or regional mass effect. No other acute or interval intracranial hemorrhage. No other acute large vessel territory infarct. No mass lesion, mass effect, or midline shift. No hydrocephalus or extra-axial fluid collection. Atrophy with chronic microvascular ischemic disease again noted. Vascular: No hyperdense vessel. Calcified atherosclerosis at the skull base. Skull: Scalp soft tissues and calvarium within normal limits. Sinuses/Orbits: Globes and orbital soft tissues demonstrate no acute finding. Mild mucoperiosteal thickening noted throughout the visualized paranasal sinuses. Mastoid air cells remain clear. Other: None. IMPRESSION: 1. Normal expected interval evolution of acute intraparenchymal hemorrhage positioned at the posterior right lentiform nucleus, overall similar in size but with slightly decreased density as compared to previous. No significant surrounding edema or regional mass effect. 2. No other new acute intracranial abnormality. 3. Atrophy with chronic microvascular ischemic disease. Electronically Signed   By: Jeannine Boga M.D.   On: 07/13/2020 18:45   CT PELVIS WO CONTRAST  Result Date: 07/14/2020 CLINICAL DATA:  Right hip pain EXAM: CT PELVIS WITHOUT CONTRAST TECHNIQUE: Multidetector CT imaging of the pelvis was performed following the standard protocol without intravenous contrast. COMPARISON:  Radiographs 07/12/2020 FINDINGS: Urinary Tract: Bladder and distal ureters obscured by streak artifact from the patient's bilateral hip hardware. Bowel:  Unremarkable, rectum partially obscured by streak artifact. Vascular/Lymphatic: Aortoiliac atherosclerotic vascular disease. No pathologic adenopathy identified. Reproductive:  Unremarkable Other:  No supplemental non-categorized findings. Musculoskeletal: Right hip screw with IM nail. Single distal interlocking screw along the IM nail. Inter trochanteric deformity from old fracture, no acute fracture is  identified. Severe degenerative right hip arthropathy with loss of articular space and spurring. Left hip hemiarthroplasty without discrete complicating feature identified. Mild spurring along the SI joints. There is lower lumbar spondylosis and degenerative disc disease leading to moderate left foraminal stenosis at L5-S1. IMPRESSION: 1. Severe degenerative right hip arthropathy. 2. Right hip screw with IM nail. Right intertrochanteric deformity from old fracture.  No acute fracture is identified. 3. Left hip hemiarthroplasty without discrete complicating feature identified. 4. Lower lumbar spondylosis and degenerative disc disease leading to moderate left foraminal stenosis at L5-S1. 5. Aortic atherosclerosis. Aortic Atherosclerosis (ICD10-I70.0). Electronically Signed   By: Van Clines M.D.   On: 07/14/2020 14:12   MR ANGIO HEAD WO CONTRAST  Result Date: 07/13/2020 CLINICAL DATA:  Follow-up examination for acute stroke. EXAM: MRI HEAD WITHOUT CONTRAST MRA HEAD WITHOUT CONTRAST TECHNIQUE: Multiplanar, multiecho pulse sequences of the brain and surrounding structures were obtained without intravenous contrast. Angiographic images of the head were obtained using MRA technique without contrast. COMPARISON:  Prior head CT from 07/12/2020 as well as previous brain MRI from 08/17/2016. FINDINGS: MRI HEAD FINDINGS Brain: Diffuse prominence of the CSF containing spaces compatible with generalized age-related cerebral atrophy. Patchy and confluent T2/FLAIR hyperintensity within the periventricular and deep white matter both cerebral hemispheres most likely related chronic microvascular ischemic disease. Patchy involvement of the pons. Overall, these changes are mildly progressed as compared to 2018. Few small remote lacunar type infarcts noted about the basal ganglia, most notable which present at the right caudothalamic groove. Previously identified hemorrhage centered at the posterior right lentiform  nucleus/right internal capsule again seen, stable in size measuring up to approximately 1 cm in greatest dimension. This is somewhat difficult to visualized by MR, and is most notable on T1 weighted sequence (series 16, image 26). No significant surrounding edema or regional mass effect. No other acute intracranial hemorrhage. Few scattered chronic micro hemorrhages noted at the right cerebellum and pons, likely small vessel/hypertensive in nature. Single punctate 4 mm focus of diffusion abnormality involving the subcortical white matter of the right parietal corona radiata noted, likely a small acute to subacute small vessel type ischemic infarct (series 5, image 74). No associated hemorrhage or mass effect. No other diffusion abnormality to suggest acute or subacute ischemia. Gray-white matter differentiation otherwise maintained. No mass lesion, mass effect, or midline shift. No hydrocephalus or extra-axial fluid collection. Pituitary gland suprasellar region normal. Midline structures intact. 1.3 cm ovoid well-circumscribed lesion positioned at the left petrous apex demonstrating heterogeneous T2/FLAIR signal intensity with hyperintense T1 signal intensity noted (series 16, image 14), nonspecific, but could reflect a small cholesterol granuloma versus asymmetric fatty marrow. Finding is relatively stable from previous MRI from 2018, and of doubtful significance. Vascular: Major intracranial vascular flow voids are maintained. Skull and upper cervical spine: Craniocervical junction within normal limits. Multilevel degenerative spondylosis noted within the visualized upper cervical spine with resultant mild-to-moderate spinal stenosis. Bone marrow signal intensity within normal limits. No scalp soft tissue abnormality. Sinuses/Orbits: Patient status post bilateral ocular lens replacement. Globes and orbital soft tissues demonstrate no acute finding. Mild scattered mucosal thickening noted throughout the paranasal  sinuses. Trace bilateral mastoid effusions, of doubtful significance. Visualized nasopharynx within normal limits. Other: None. MRA HEAD FINDINGS ANTERIOR CIRCULATION: Examination degraded by motion artifact. Visualized distal cervical segments of the internal carotid arteries are patent with symmetric antegrade flow. Petrous segments widely patent. Atheromatous irregularity throughout the carotid siphons with associated mild multifocal narrowing. A1 segments patent bilaterally. Normal anterior communicating artery complex. Anterior cerebral arteries patent to their distal aspects without high-grade stenosis. Short-segment severe stenosis at the distal left M1 segment/left MCA bifurcation (series 1036, image 12). Left MCA branches are diffusely irregular but perfused distally. Right M1 segment patent proximally. Focal signal cutoff at the mid-distal right M1 segment into the bifurcation (series 1036, image 11). This likely reflects a severe high-grade stenosis,  as the right MCA branches are perfused distally. Diffuse atheromatous irregularity seen throughout the right MCA branches as well. Proximally. POSTERIOR CIRCULATION: Vertebral arteries largely code dominant and patent to the vertebrobasilar junction. Both PICA origins patent and normal. Basilar patent to its distal aspect without stenosis. Superior cerebral arteries patent bilaterally. Both PCAs primarily supplied via the basilar. Severe long segment stenosis involving the mid-distal right P2 segment (series 1046, image 15). Severe multifocal distal left P2 and P3 stenoses present as well (series 1046, image 14). PCAs remain perfused to their distal aspects. No intracranial aneurysm or other vascular abnormality. IMPRESSION: MRI HEAD IMPRESSION: 1. Unchanged 1 cm hemorrhage centered at the posterior right lentiform nucleus/right internal capsule. No significant surrounding edema or regional mass effect. 2. 4 mm focus of diffusion abnormality involving the  subcortical white matter of the right parietal corona radiata, consistent with a small acute to subacute small vessel type ischemic infarct. No associated hemorrhage. 3. Underlying age-related cerebral atrophy with moderate chronic microvascular ischemic disease, mildly progressed as compared to 2018. MRA HEAD IMPRESSION: 1. Technically limited exam due to motion artifact. 2. Negative intracranial MRA for large vessel occlusion. 3. Focal severe high-grade stenoses involving the mid-distal right M1 segment and distal left M1 segment/left MCA bifurcation. Left MCA branches remain perfused distally. 4. Severe multifocal bilateral P2 and distal left P3 stenoses as above. Electronically Signed   By: Jeannine Boga M.D.   On: 07/13/2020 19:16   MR BRAIN WO CONTRAST  Result Date: 07/13/2020 CLINICAL DATA:  Follow-up examination for acute stroke. EXAM: MRI HEAD WITHOUT CONTRAST MRA HEAD WITHOUT CONTRAST TECHNIQUE: Multiplanar, multiecho pulse sequences of the brain and surrounding structures were obtained without intravenous contrast. Angiographic images of the head were obtained using MRA technique without contrast. COMPARISON:  Prior head CT from 07/12/2020 as well as previous brain MRI from 08/17/2016. FINDINGS: MRI HEAD FINDINGS Brain: Diffuse prominence of the CSF containing spaces compatible with generalized age-related cerebral atrophy. Patchy and confluent T2/FLAIR hyperintensity within the periventricular and deep white matter both cerebral hemispheres most likely related chronic microvascular ischemic disease. Patchy involvement of the pons. Overall, these changes are mildly progressed as compared to 2018. Few small remote lacunar type infarcts noted about the basal ganglia, most notable which present at the right caudothalamic groove. Previously identified hemorrhage centered at the posterior right lentiform nucleus/right internal capsule again seen, stable in size measuring up to approximately 1 cm in  greatest dimension. This is somewhat difficult to visualized by MR, and is most notable on T1 weighted sequence (series 16, image 26). No significant surrounding edema or regional mass effect. No other acute intracranial hemorrhage. Few scattered chronic micro hemorrhages noted at the right cerebellum and pons, likely small vessel/hypertensive in nature. Single punctate 4 mm focus of diffusion abnormality involving the subcortical white matter of the right parietal corona radiata noted, likely a small acute to subacute small vessel type ischemic infarct (series 5, image 74). No associated hemorrhage or mass effect. No other diffusion abnormality to suggest acute or subacute ischemia. Gray-white matter differentiation otherwise maintained. No mass lesion, mass effect, or midline shift. No hydrocephalus or extra-axial fluid collection. Pituitary gland suprasellar region normal. Midline structures intact. 1.3 cm ovoid well-circumscribed lesion positioned at the left petrous apex demonstrating heterogeneous T2/FLAIR signal intensity with hyperintense T1 signal intensity noted (series 16, image 14), nonspecific, but could reflect a small cholesterol granuloma versus asymmetric fatty marrow. Finding is relatively stable from previous MRI from 2018, and of doubtful significance. Vascular:  Major intracranial vascular flow voids are maintained. Skull and upper cervical spine: Craniocervical junction within normal limits. Multilevel degenerative spondylosis noted within the visualized upper cervical spine with resultant mild-to-moderate spinal stenosis. Bone marrow signal intensity within normal limits. No scalp soft tissue abnormality. Sinuses/Orbits: Patient status post bilateral ocular lens replacement. Globes and orbital soft tissues demonstrate no acute finding. Mild scattered mucosal thickening noted throughout the paranasal sinuses. Trace bilateral mastoid effusions, of doubtful significance. Visualized nasopharynx  within normal limits. Other: None. MRA HEAD FINDINGS ANTERIOR CIRCULATION: Examination degraded by motion artifact. Visualized distal cervical segments of the internal carotid arteries are patent with symmetric antegrade flow. Petrous segments widely patent. Atheromatous irregularity throughout the carotid siphons with associated mild multifocal narrowing. A1 segments patent bilaterally. Normal anterior communicating artery complex. Anterior cerebral arteries patent to their distal aspects without high-grade stenosis. Short-segment severe stenosis at the distal left M1 segment/left MCA bifurcation (series 1036, image 12). Left MCA branches are diffusely irregular but perfused distally. Right M1 segment patent proximally. Focal signal cutoff at the mid-distal right M1 segment into the bifurcation (series 1036, image 11). This likely reflects a severe high-grade stenosis, as the right MCA branches are perfused distally. Diffuse atheromatous irregularity seen throughout the right MCA branches as well. Proximally. POSTERIOR CIRCULATION: Vertebral arteries largely code dominant and patent to the vertebrobasilar junction. Both PICA origins patent and normal. Basilar patent to its distal aspect without stenosis. Superior cerebral arteries patent bilaterally. Both PCAs primarily supplied via the basilar. Severe long segment stenosis involving the mid-distal right P2 segment (series 1046, image 15). Severe multifocal distal left P2 and P3 stenoses present as well (series 1046, image 14). PCAs remain perfused to their distal aspects. No intracranial aneurysm or other vascular abnormality. IMPRESSION: MRI HEAD IMPRESSION: 1. Unchanged 1 cm hemorrhage centered at the posterior right lentiform nucleus/right internal capsule. No significant surrounding edema or regional mass effect. 2. 4 mm focus of diffusion abnormality involving the subcortical white matter of the right parietal corona radiata, consistent with a small acute to  subacute small vessel type ischemic infarct. No associated hemorrhage. 3. Underlying age-related cerebral atrophy with moderate chronic microvascular ischemic disease, mildly progressed as compared to 2018. MRA HEAD IMPRESSION: 1. Technically limited exam due to motion artifact. 2. Negative intracranial MRA for large vessel occlusion. 3. Focal severe high-grade stenoses involving the mid-distal right M1 segment and distal left M1 segment/left MCA bifurcation. Left MCA branches remain perfused distally. 4. Severe multifocal bilateral P2 and distal left P3 stenoses as above. Electronically Signed   By: Jeannine Boga M.D.   On: 07/13/2020 19:16   DG Pelvis Portable  Result Date: 07/12/2020 CLINICAL DATA:  Right hip pain.  No reported injury. EXAM: PORTABLE PELVIS 1-2 VIEWS COMPARISON:  07/21/2018 outside pelvic radiographs FINDINGS: Partially visualized left hip hemiarthroplasty and partially visualized fixation hardware in the proximal right femur with no evidence of hardware fracture or loosening. No evidence of hip dislocation on this single frontal view. No osseous fracture. No focal osseous lesions. Moderate right hip osteoarthritis. No suspicious focal osseous lesions. No pelvic diastasis. IMPRESSION: No acute osseous abnormality. No evidence of hip dislocation on this single frontal view. Moderate right hip osteoarthritis. Partially visualized left hip hemiarthroplasty and proximal right femur fixation hardware, with no evidence of hardware complication. Electronically Signed   By: Ilona Sorrel M.D.   On: 07/12/2020 12:33   DG CHEST PORT 1 VIEW  Result Date: 07/15/2020 CLINICAL DATA:  COVID-19. EXAM: PORTABLE CHEST 1 VIEW COMPARISON:  07/12/2020. FINDINGS: Right dual-lumen catheter stable position. Stable cardiomegaly. Progressive bilateral pulmonary infiltrates/edema. Tiny bilateral pleural effusions cannot be excluded. No pneumothorax. Degenerative change thoracic spine. Surgical clips right  chest. IMPRESSION: 1. Right dual-lumen catheter in stable position. 2. Stable cardiomegaly. 3. Progressive bilateral pulmonary infiltrates/edema. Tiny bilateral pleural effusions cannot be excluded. Electronically Signed   By: Marcello Moores  Register   On: 07/15/2020 05:24   DG Abd Portable 1V  Result Date: 07/13/2020 CLINICAL DATA:  Evaluate orogastric tube EXAM: PORTABLE ABDOMEN - 1 VIEW COMPARISON:  July 13, 2020 FINDINGS: The OG tube side port and distal tip are in the region the stomach. IMPRESSION: The OG tube is in good position. Electronically Signed   By: Dorise Bullion III M.D   On: 07/13/2020 11:04   DG Abd Portable 1V  Result Date: 07/13/2020 CLINICAL DATA:  Vomiting. EXAM: PORTABLE ABDOMEN - 1 VIEW COMPARISON:  None. FINDINGS: No evidence of dilated bowel loops. Aortic atherosclerotic calcification noted. Compression screw noted in the right hip. Left hip prosthesis is also seen. IMPRESSION: Unremarkable bowel gas pattern.  No acute findings. Electronically Signed   By: Marlaine Hind M.D.   On: 07/13/2020 05:08   ECHOCARDIOGRAM COMPLETE  Result Date: 07/14/2020    ECHOCARDIOGRAM REPORT   Patient Name:   [name] Date of Exam: 07/14/2020 Medical Rec #:  [id number]     Height:       64.0 in Accession #:    [id number]    Weight:       174.6 lb Date of Birth:  1941/01/02      BSA:          1.847 m Patient Age:    71 years      BP:           114/75 mmHg Patient Gender: F             HR:           77 bpm. Exam Location:  Inpatient Procedure: 2D Echo, Color Doppler and Cardiac Doppler Indications:    Stroke 434.91 / I163.9  History:        Patient has no prior history of Echocardiogram examinations.                 Risk Factors:Dyslipidemia and Diabetes.  Sonographer:    Bernadene Person RDCS Referring Phys: [place]  1. Left ventricular ejection fraction, by estimation, is 60 to 65%. The left ventricle has normal function. The left ventricle has no regional wall motion  abnormalities. There is moderate left ventricular hypertrophy. Left ventricular diastolic parameters are consistent with Grade I diastolic dysfunction (impaired relaxation). Elevated left ventricular end-diastolic pressure. The E/e' is 66.  2. Right ventricular systolic function is normal. The right ventricular size is normal.  3. The pericardial effusion is posterior to the left ventricle.  4. The mitral valve is abnormal. Mild mitral valve regurgitation. Moderate mitral annular calcification.  5. The aortic valve is tricuspid. Aortic valve regurgitation is not visualized. Mild aortic valve sclerosis is present, with no evidence of aortic valve stenosis. Comparison(s): No prior Echocardiogram. FINDINGS  Left Ventricle: Left ventricular ejection fraction, by estimation, is 60 to 65%. The left ventricle has normal function. The left ventricle has no regional wall motion abnormalities. The left ventricular internal cavity size was normal in size. There is  moderate left ventricular hypertrophy. Left ventricular diastolic parameters are consistent with Grade I diastolic dysfunction (impaired relaxation). Elevated left ventricular end-diastolic  pressure. The E/e' is 20. Right Ventricle: The right ventricular size is normal. No increase in right ventricular wall thickness. Right ventricular systolic function is normal. Left Atrium: Left atrial size was normal in size. Right Atrium: Right atrial size was normal in size. Pericardium: Trivial pericardial effusion is present. The pericardial effusion is posterior to the left ventricle. Mitral Valve: The mitral valve is abnormal. There is mild thickening of the mitral valve leaflet(s). Moderate mitral annular calcification. Mild mitral valve regurgitation, with posteriorly-directed jet. Tricuspid Valve: The tricuspid valve is grossly normal. Tricuspid valve regurgitation is trivial. Aortic Valve: The aortic valve is tricuspid. Aortic valve regurgitation is not visualized.  Mild aortic valve sclerosis is present, with no evidence of aortic valve stenosis. Pulmonic Valve: The pulmonic valve was normal in structure. Pulmonic valve regurgitation is trivial. Aorta: The aortic root and ascending aorta are structurally normal, with no evidence of dilitation. Venous: The inferior vena cava was not well visualized. IAS/Shunts: No atrial level shunt detected by color flow Doppler.  LEFT VENTRICLE PLAX 2D LVIDd:         4.30 cm  Diastology LVIDs:         2.80 cm  LV e' medial:    5.66 cm/s LV PW:         1.20 cm  LV E/e' medial:  21.9 LV IVS:        1.20 cm  LV e' lateral:   6.20 cm/s LVOT diam:     2.10 cm  LV E/e' lateral: 20.0 LV SV:         103 LV SV Index:   56 LVOT Area:     3.46 cm  RIGHT VENTRICLE RV S prime:     13.90 cm/s TAPSE (M-mode): 1.8 cm LEFT ATRIUM             Index       RIGHT ATRIUM           Index LA diam:        3.30 cm 1.79 cm/m  RA Area:     13.60 cm LA Vol (A2C):   51.0 ml 27.62 ml/m RA Volume:   28.50 ml  15.43 ml/m LA Vol (A4C):   50.1 ml 27.13 ml/m LA Biplane Vol: 51.9 ml 28.10 ml/m  AORTIC VALVE LVOT Vmax:   143.00 cm/s LVOT Vmean:  98.200 cm/s LVOT VTI:    0.296 m  AORTA Ao Root diam: 3.00 cm Ao Asc diam:  3.40 cm MITRAL VALVE                TRICUSPID VALVE MV Area (PHT): 3.21 cm     TR Peak grad:   23.2 mmHg MV Decel Time: 236 msec     TR Vmax:        241.00 cm/s MV E velocity: 124.00 cm/s MV A velocity: 120.00 cm/s  SHUNTS MV E/A ratio:  1.03         Systemic VTI:  0.30 m                             Systemic Diam: 2.10 cm Lyman Bishop MD Electronically signed by Lyman Bishop MD Signature Date/Time: 07/14/2020/2:10:04 PM    Final    CT HEAD CODE STROKE WO CONTRAST  Addendum Date: 07/12/2020   ADDENDUM REPORT: 07/12/2020 10:16 ADDENDUM: Correction.  Findings were discussed with Dr. Theda Sers. Electronically Signed   By: Margaretha Sheffield MD  On: 07/12/2020 10:16   Result Date: 07/12/2020 CLINICAL DATA:  Code stroke.  Neuro deficit, acute stroke  suspected. EXAM: CT HEAD WITHOUT CONTRAST TECHNIQUE: Contiguous axial images were obtained from the base of the skull through the vertex without intravenous contrast. COMPARISON:  MRI head August 17, 2016 FINDINGS: Brain: Acute hemorrhage in the right basal ganglia, centered in the region of the posterior putamen/posterior limb of the internal capsule. The hemorrhage measures approximately 7 x 4 by 6 mm. No evidence of acute large vascular territory infarct. Patchy white matter hypoattenuation, which is nonspecific but most likely related to chronic microvascular ischemic disease. Mild generalized atrophy. No hydrocephalus. No evidence of a mass lesion. No midline shift. Remote right cerebellar lacunar infarct. Vascular: Calcific atherosclerosis. Skull: No acute fracture. Sinuses/Orbits: Sinuses are clear.  Unremarkable orbits. Other: No mastoid effusion. IMPRESSION: 1. Acute 7 mm hemorrhage within the right basal ganglia. No substantial mass effect. 2. Chronic microvascular ischemic disease and remote right cerebellar lacunar infarct. Code stroke imaging results were communicated on 07/12/2020 at 10:00 am to provider Dr. Lorrin Goodell via telephone, who verbally acknowledged these results. Electronically Signed: By: Margaretha Sheffield MD On: 07/12/2020 10:05   VAS US CAROTID  Result Date: 07/14/2020 Carotid Arterial Duplex Study Indications:       CVA. Risk Factors:      Hyperlipidemia, Diabetes. Limitations        Today's exam was limited due to the patient's inability or                    unwillingness to cooperate. Comparison Study:  no prior Performing Technologist: Abram Sander RVS  Examination Guidelines: A complete evaluation includes B-mode imaging, spectral Doppler, color Doppler, and power Doppler as needed of all accessible portions of each vessel. Bilateral testing is considered an integral part of a complete examination. Limited examinations for reoccurring indications may be performed as noted.   Right Carotid Findings: +----------+--------+--------+--------+------------------+--------------+           PSV cm/sEDV cm/sStenosisPlaque DescriptionComments       +----------+--------+--------+--------+------------------+--------------+ CCA Prox  54      9               heterogenous                     +----------+--------+--------+--------+------------------+--------------+ CCA Distal65      16              heterogenous                     +----------+--------+--------+--------+------------------+--------------+ ICA Prox  48      17      1-39%   heterogenous                     +----------+--------+--------+--------+------------------+--------------+ ICA Distal                                          Not visualized +----------+--------+--------+--------+------------------+--------------+ ECA       91      9                                                +----------+--------+--------+--------+------------------+--------------+ +----------+--------+-------+--------------------------+-------------------+  PSV cm/sEDV cmsDescribe                  Arm Pressure (mmHG) +----------+--------+-------+--------------------------+-------------------+ Subclavian               not visualized due to port                    +----------+--------+-------+--------------------------+-------------------+ +---------+--------+--------+--------------+ VertebralPSV cm/sEDV cm/sNot identified +---------+--------+--------+--------------+  Left Carotid Findings: +----------+--------+--------+--------+------------------+--------------+           PSV cm/sEDV cm/sStenosisPlaque DescriptionComments       +----------+--------+--------+--------+------------------+--------------+ CCA Prox  53      5               heterogenous                     +----------+--------+--------+--------+------------------+--------------+ CCA Distal73      10               heterogenous                     +----------+--------+--------+--------+------------------+--------------+ ICA Prox  127     16      1-39%   heterogenous                     +----------+--------+--------+--------+------------------+--------------+ ICA Distal                                          Not visualized +----------+--------+--------+--------+------------------+--------------+ ECA       220                                                      +----------+--------+--------+--------+------------------+--------------+ +----------+--------+--------+--------------+-------------------+           PSV cm/sEDV cm/sDescribe      Arm Pressure (mmHG) +----------+--------+--------+--------------+-------------------+ Subclavian                Not identified                    +----------+--------+--------+--------------+-------------------+ +---------+--------+--------+--------------+ VertebralPSV cm/sEDV cm/sNot identified +---------+--------+--------+--------------+   Summary: Right Carotid: Velocities in the right ICA are consistent with a 1-39% stenosis. Left Carotid: Velocities in the left ICA are consistent with a 1-39% stenosis. Vertebrals: Bilateral vertebral arteries were not visualized. *See table(s) above for measurements and observations.  Electronically signed by Antony Contras MD on 07/14/2020 at 1:04:27 PM.    Final

## 2020-07-21 NOTE — Progress Notes (Signed)
PT Cancellation Note  Patient Details Name: [name] MRN: [id number] DOB: April 18, 1941   Cancelled Treatment:      Pt in HD.  PT to check back later today or tomorrow as time allows.   Thanks,  Verdene Lennert, PT, DPT  Acute Rehabilitation [phone] pager #[phone] office       Barbarann Ehlers Antonela Freiman 07/21/2020, 6:17 PM

## 2020-07-21 NOTE — Progress Notes (Signed)
Patient off the unit for dialysis.

## 2020-07-21 NOTE — TOC Progression Note (Addendum)
Transition of Care [facility] - Progression Note    Patient Details  Name: [name] MRN: [id number] Date of Birth: 1941-04-07  Transition of Care [facility]) CM/SW [place], LCSW Phone Number: 07/21/2020, 12:09 PM  Clinical Narrative:    12pm-CSW left voicemail for patient's granddaughter, [name] to determine discharge plan for tomorrow.   12:30pm-CSW received return call from [place] (on her lunch break). She reports that she is aware that patient is requesting to return home but that her husband is currently in [facility] with [facility] and he is too weak to take care of her right now. CSW spoke with patient's spouse, [name]. He too reported that he is hoping to be discharged soon but is requesting SNF for patient. CSW requested RN help the patient call [name] so they could speak on the phone. CSW sent referral to [facility] to see about bed availability (Pt declined by [place], [facility], [place], and [facility]) and will request [place] dialysis clinic seat transfer from Renal Navigator.      Barriers to Discharge: Continued Medical Work up,SNF Pending bed offer,Insurance Authorization  Expected Discharge Plan and Services   In-house Referral: Clinical Social Work     Living arrangements for the past 2 months: Single Family Home                                       Social Determinants of Health (SDOH) Interventions    Readmission Risk Interventions No flowsheet data found.

## 2020-07-21 NOTE — Progress Notes (Signed)
Patient back on the unit from dialysis.

## 2020-07-21 NOTE — Progress Notes (Signed)
Patient ID: [name], female   DOB: 1941/05/18, 79 y.o.   MRN: [id number] [facility] Progress Note   Assessment/ Plan:   1.  COVID-19 infection: This was diagnosed on 12/13 and she underwent monoclonal antibody treatment on 12/17.  She was unvaccinated.  Clinically appears to be stable and without any evidence of pneumonia/hypoxia. 2.  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  With some residual right hand weakness noted. 3. ESRD: usual HD is MWF. HD today.  4.  GI bleed with acute blood loss anemia: Status post PRBC transfusion earlier with improvement of hemoglobin and hemodynamic status.  Underwent EGD yesterday showing gastritis and duodenitis without any focal bleeding.  Ongoing medical management with PPI. 5. CKD-MBD: Elevated phosphorus level noted, will continue to trend this during hospitalization.  Continue binders/VDRA. 6. Nutrition: Resumed renal diet with fluid restriction 7.  Paroxysmal atrial fibrillation with rapid ventricular response: Back in sinus rhythm/rate controlled and now back on Eliquis following transient heparin drip.  [name], MD 07/21/2020, 4:42 PM    Subjective:   No c/o today.    Objective:   BP 140/77 (BP Location: Left Arm)   Pulse 75   Temp 98.7 F (37.1 C) (Axillary)   Resp 20   Ht 5\' 4"  (1.626 m)   Wt 74.4 kg   SpO2 95%   BMI 28.15 kg/m   Physical Exam:  alert, nad , on room air  no jvd  Chest cta bilat  Cor reg no RG  Abd soft ntnd no ascites   Ext trace LE edema   Alert, NF, ox3   RIJ TDC   OP HD:MWF [place] 3.5h (running 3hrs on COVID shift this week) 400/800 79kg 3K/2.25 bath P2 Hep 3500 RIJ TDC/ no AVF - calcitriol 0.5 ug tiw  Labs: BMET Recent Labs  Lab 07/15/20 0251 07/16/20 0146 07/17/20 0152 07/18/20 0050  07/19/20 0200 07/21/20 0503  NA 138 138 138 137 135 133*  K 3.9 3.0* 3.6 3.9 4.0 4.5  CL 101 100 98 98 96* 97*  CO2 21* 24 23 25 23 24   GLUCOSE 89 86 221* 140* 133* 158*  BUN 41* 24* 37* 17 34* 27*  CREATININE 4.79* 4.12* 5.93* 4.37* 6.30* 5.38*  CALCIUM 7.7* 7.5* 8.3* 8.6* 8.7* 8.6*   CBC Recent Labs  Lab 07/18/20 0050 07/19/20 0200 07/20/20 0524 07/21/20 0503  WBC 5.1 5.1 5.0 5.0  NEUTROABS 3.1 2.9 3.0 2.9  HGB 12.0 11.5* 11.0* 10.3*  HCT 37.7 36.3 34.7* 33.4*  MCV 90.8 90.8 90.6 91.5  PLT 130* 157 176 194      Medications:    . amiodarone  200 mg Oral Daily  . apixaban  2.5 mg Oral BID  . aspirin EC  81 mg Oral Daily  . atorvastatin  20 mg Oral QHS  . chlorhexidine  15 mL Mouth Rinse BID  . Chlorhexidine Gluconate Cloth  6 each Topical Daily  . Chlorhexidine Gluconate Cloth  6 each Topical Q0600  . diltiazem  120 mg Oral Daily  . insulin aspart  0-6 Units Subcutaneous Q4H  . mouth rinse  15 mL Mouth Rinse q12n4p  . midodrine  10 mg Oral Once  . pantoprazole  40 mg Oral BID

## 2020-07-22 ENCOUNTER — Inpatient Hospital Stay (HOSPITAL_COMMUNITY): Admission: RE | Admit: 2020-07-22 | Payer: Medicare HMO | Source: Ambulatory Visit

## 2020-07-22 ENCOUNTER — Other Ambulatory Visit (HOSPITAL_COMMUNITY): Payer: Medicare HMO

## 2020-07-22 ENCOUNTER — Encounter: Payer: Medicare HMO | Admitting: Vascular Surgery

## 2020-07-22 LAB — CBC WITH DIFFERENTIAL/PLATELET
Abs Immature Granulocytes: 0.13 10*3/uL — ABNORMAL HIGH (ref 0.00–0.07)
Basophils Absolute: 0 10*3/uL (ref 0.0–0.1)
Basophils Relative: 0 %
Eosinophils Absolute: 0.1 10*3/uL (ref 0.0–0.5)
Eosinophils Relative: 2 %
HCT: 32.2 % — ABNORMAL LOW (ref 36.0–46.0)
Hemoglobin: 10.1 g/dL — ABNORMAL LOW (ref 12.0–15.0)
Immature Granulocytes: 2 %
Lymphocytes Relative: 16 %
Lymphs Abs: 0.9 10*3/uL (ref 0.7–4.0)
MCH: 28.6 pg (ref 26.0–34.0)
MCHC: 31.4 g/dL (ref 30.0–36.0)
MCV: 91.2 fL (ref 80.0–100.0)
Monocytes Absolute: 0.8 10*3/uL (ref 0.1–1.0)
Monocytes Relative: 15 %
Neutro Abs: 3.6 10*3/uL (ref 1.7–7.7)
Neutrophils Relative %: 65 %
Platelets: 181 10*3/uL (ref 150–400)
RBC: 3.53 MIL/uL — ABNORMAL LOW (ref 3.87–5.11)
RDW: 16.8 % — ABNORMAL HIGH (ref 11.5–15.5)
WBC: 5.5 10*3/uL (ref 4.0–10.5)
nRBC: 0 % (ref 0.0–0.2)

## 2020-07-22 LAB — GLUCOSE, CAPILLARY
Glucose-Capillary: 165 mg/dL — ABNORMAL HIGH (ref 70–99)
Glucose-Capillary: 173 mg/dL — ABNORMAL HIGH (ref 70–99)
Glucose-Capillary: 199 mg/dL — ABNORMAL HIGH (ref 70–99)
Glucose-Capillary: 204 mg/dL — ABNORMAL HIGH (ref 70–99)
Glucose-Capillary: 215 mg/dL — ABNORMAL HIGH (ref 70–99)
Glucose-Capillary: 271 mg/dL — ABNORMAL HIGH (ref 70–99)

## 2020-07-22 LAB — SURGICAL PATHOLOGY

## 2020-07-22 MED ORDER — AMIODARONE HCL 200 MG PO TABS: 200.0000 mg | ORAL_TABLET | Freq: Every day | ORAL | 0 refills | Status: AC

## 2020-07-22 MED ORDER — PANTOPRAZOLE SODIUM 40 MG PO TBEC: 40.0000 mg | DELAYED_RELEASE_TABLET | Freq: Two times a day (BID) | ORAL | 0 refills | Status: DC

## 2020-07-22 MED ORDER — DILTIAZEM HCL ER COATED BEADS 120 MG PO CP24: 120.0000 mg | ORAL_CAPSULE | Freq: Every day | ORAL | 0 refills | Status: DC

## 2020-07-22 MED ORDER — APIXABAN 2.5 MG PO TABS: 2.5000 mg | ORAL_TABLET | Freq: Two times a day (BID) | ORAL | 0 refills | Status: DC

## 2020-07-22 MED ORDER — HYDRALAZINE HCL 25 MG PO TABS
25.0000 mg | ORAL_TABLET | Freq: Three times a day (TID) | ORAL | Status: DC
  Administered 2020-07-22 – 2020-07-30 (×23): 25 mg via ORAL
  Filled 2020-07-22 (×23): qty 1

## 2020-07-22 NOTE — Progress Notes (Signed)
[facility] is able to accommodate patient in a COVID shift for the rest of her isolation period (12/29, 12/31, and 1/3), however [facility] does not have a negative shift seat available to offer her after she is out of isolation, so she would need to return to her [facility] as of 07/31/19.  Navigator informed TOC CSW of above.   Alphonzo Cruise, [place]  Renal Navigator [phone]

## 2020-07-22 NOTE — Progress Notes (Signed)
Physical Therapy Treatment Patient Details Name: [name] MRN: [id number] DOB: 13-Sep-1940 Today's Date: 07/22/2020    History of Present Illness Pt is a 79 y.o. female with initial COVID dx on 07/07/20 and underwent antibody infusion, admitted 07/12/20 as code stroke with L-side facial droop and flaccid paralysis. Workup revealed 7 mm hemorrhage in R basal ganglia; not a tPA or IR candidate. Pt also with new onset PAF, GIB. S/p EGD 12/23. PMH includes CA, DM, ESRD (HD MWF).    PT Comments    Pt making steady progress. Recommend ST-SNF to continue rehab. Pt motivated to return to independence.    Follow Up Recommendations  SNF     Equipment Recommendations  Rolling walker with 5" wheels    Recommendations for Other Services       Precautions / Restrictions Precautions Precautions: Fall;Other (comment) Precaution Comments: watch for orthostatic    Mobility  Bed Mobility               General bed mobility comments: Pt up in chair  Transfers Overall transfer level: Needs assistance Equipment used: Rolling walker (2 wheeled) Transfers: Sit to/from Stand Sit to Stand: Min assist         General transfer comment: Assist for balance. Verbal cues for hand placement  Ambulation/Gait Ambulation/Gait assistance: Min assist Gait Distance (Feet): 90 Feet (90' x 1, 30' x 1) Assistive device: Rolling walker (2 wheeled) Gait Pattern/deviations: Narrow base of support;Step-through pattern;Decreased step length - right;Decreased step length - left;Drifts right/left Gait velocity: Decreased Gait velocity interpretation: <1.31 ft/sec, indicative of household ambulator General Gait Details: Assist for balance. Pt with veer to the rt. Unsure if pt generated or walker pulling that direction.   Stairs             Wheelchair Mobility    Modified Rankin (Stroke Patients Only) Modified Rankin (Stroke Patients Only) Pre-Morbid Rankin Score: No significant  disability Modified Rankin: Moderately severe disability     Balance Overall balance assessment: Needs assistance Sitting-balance support: No upper extremity supported;Feet supported Sitting balance-Leahy Scale: Fair     Standing balance support: Bilateral upper extremity supported;During functional activity Standing balance-Leahy Scale: Poor Standing balance comment: walker and min guard for static standing                            Cognition Arousal/Alertness: Awake/alert Behavior During Therapy: Flat affect Overall Cognitive Status: Impaired/Different from baseline Area of Impairment: Attention;Memory;Following commands;Safety/judgement;Awareness;Problem solving                   Current Attention Level: Selective Memory: Decreased short-term memory Following Commands: Follows multi-step commands consistently;Follows multi-step commands with increased time Safety/Judgement: Decreased awareness of safety;Decreased awareness of deficits Awareness: Emergent Problem Solving: Slow processing;Requires verbal cues        Exercises      General Comments        Pertinent Vitals/Pain Pain Assessment: No/denies pain    Home Living                      Prior Function            PT Goals (current goals can now be found in the care plan section) Acute Rehab PT Goals Patient Stated Goal: to "get back to living" PT Goal Formulation: With patient Time For Goal Achievement: 07/30/20 Potential to Achieve Goals: Good Progress towards PT goals: Progressing toward goals  Frequency    Min 3X/week      PT Plan Current plan remains appropriate;Frequency needs to be updated    Co-evaluation              AM-PAC PT "6 Clicks" Mobility   Outcome Measure  Help needed turning from your back to your side while in a flat bed without using bedrails?: A Little Help needed moving from lying on your back to sitting on the side of a flat bed  without using bedrails?: A Little Help needed moving to and from a bed to a chair (including a wheelchair)?: A Little Help needed standing up from a chair using your arms (e.g., wheelchair or bedside chair)?: A Little Help needed to walk in hospital room?: A Little Help needed climbing 3-5 steps with a railing? : A Lot 6 Click Score: 17    End of Session Equipment Utilized During Treatment: Gait belt Activity Tolerance: Patient tolerated treatment well Patient left: in chair;with call bell/phone within reach;with chair alarm set Nurse Communication: Mobility status PT Visit Diagnosis: Other abnormalities of gait and mobility (R26.89);Other symptoms and signs involving the nervous system (O15.615)     Time: 3794-3276 PT Time Calculation (min) (ACUTE ONLY): 21 min  Charges:  $Gait Training: 8-22 mins                     [place] Pager [phone] Office [place] 07/22/2020, 2:11 PM

## 2020-07-22 NOTE — Progress Notes (Signed)
Renal Navigator updated patient's Clinic Manager and HD Director of Operations that patient is still testing positive and hospital CSW is requesting a COVID HD shift in [place] to accommodate discharge to SNF.  Navigator will continue to follow closely.  Alphonzo Cruise, [place] Renal Navigator

## 2020-07-22 NOTE — Progress Notes (Signed)
Patient ID: [name], female   DOB: Jan 04, 1941, 79 y.o.   MRN: [id number] [facility] Progress Note   Assessment/ Plan:   1.  COVID-19 infection: This was diagnosed on 12/13 and she underwent monoclonal antibody treatment on 12/17.  She was unvaccinated.  Clinically appears to be stable and without any evidence of pneumonia/hypoxia. 2.  Acute right basal ganglia hemorrhagic CVA (ischemic infarct with conversion): Appears to be related to the patient's underlying atrial fibrillation and plans for secondary prophylaxis noted. Eliquis resumed 12/27. Some residual right hand weakness noted. 3. ESRD/ volume: usual HD is MWF. HD tomorrow. Is 4-5kg under dry wt, BP's are wnl. Mild UF w/ HD tomorrow.  4.  GI bleed with acute blood loss anemia: Status post PRBC transfusion earlier with improvement of hemoglobin and hemodynamic status.  Underwent EGD yesterday showing gastritis and duodenitis without any focal bleeding.  Ongoing medical management with PPI. 5. CKD-MBD: Elevated phosphorus level noted, will continue to trend this during hospitalization.  Continue binders/VDRA. 6. Nutrition: Resumed renal diet with fluid restriction 7.  Paroxysmal atrial fibrillation with rapid ventricular response: Back in sinus rhythm/rate controlled and now back on Eliquis following transient heparin drip.  [name], MD 07/22/2020, 2:55 PM    Subjective:   No c/o today.    Objective:   BP (!) 148/82   Pulse 75   Temp 98.4 F (36.9 C) (Oral)   Resp 19   Ht 5\' 4"  (1.626 m)   Wt 73.8 kg   SpO2 98%   BMI 27.93 kg/m   Physical Exam:  alert, nad , on room air  no jvd  Chest cta bilat  Cor reg no RG  Abd soft ntnd no ascites   Ext trace LE edema   Alert, NF, ox3   RIJ TDC   OP HD:MWF [place] 3.5h (running 3hrs on COVID shift this week) 400/800 79kg 3K/2.25 bath P2 Hep 3500 RIJ TDC/ no AVF - calcitriol 0.5 ug tiw  Labs: BMET Recent Labs  Lab 07/16/20 0146  07/17/20 0152 07/18/20 0050 07/19/20 0200 07/21/20 0503  NA 138 138 137 135 133*  K 3.0* 3.6 3.9 4.0 4.5  CL 100 98 98 96* 97*  CO2 24 23 25 23 24   GLUCOSE 86 221* 140* 133* 158*  BUN 24* 37* 17 34* 27*  CREATININE 4.12* 5.93* 4.37* 6.30* 5.38*  CALCIUM 7.5* 8.3* 8.6* 8.7* 8.6*   CBC Recent Labs  Lab 07/19/20 0200 07/20/20 0524 07/21/20 0503 07/22/20 0240  WBC 5.1 5.0 5.0 5.5  NEUTROABS 2.9 3.0 2.9 3.6  HGB 11.5* 11.0* 10.3* 10.1*  HCT 36.3 34.7* 33.4* 32.2*  MCV 90.8 90.6 91.5 91.2  PLT 157 176 194 181      Medications:    . amiodarone  200 mg Oral Daily  . apixaban  2.5 mg Oral BID  . aspirin EC  81 mg Oral Daily  . atorvastatin  20 mg Oral QHS  . chlorhexidine  15 mL Mouth Rinse BID  . Chlorhexidine Gluconate Cloth  6 each Topical Daily  . diltiazem  120 mg Oral Daily  . hydrALAZINE  25 mg Oral Q8H  . insulin aspart  0-6 Units Subcutaneous Q4H  . mouth rinse  15 mL Mouth Rinse q12n4p  . midodrine  10 mg Oral Once  . pantoprazole  40 mg Oral BID

## 2020-07-22 NOTE — Discharge Summary (Signed)
[name] WPY:[id number] DOB: [birth year] DOA: 07/12/2020  PCP: [name], MD  Admit date: 07/12/2020  Discharge date: 07/22/2020  Admitted From: Home   Disposition:  snf   Recommendations for Outpatient Follow-up:   Follow up with PCP in 1-2 weeks  PCP Please obtain BMP/CBC, 2 view CXR in 1week,  (see Discharge instructions)   PCP Please follow up on the following pending results: None   Home Health: None   Equipment/Devices: None  Consultations: Neuro, Renal Discharge Condition: Stable    CODE STATUS: Full    Diet Recommendations -soft low carbohydrate diet with 1.5 L/day fluid restriction    Chief Complaint  Patient presents with  . Code Stroke  . GI Bleeding     Brief history of present illness from the day of admission and additional interim summary    this is a 79 year old female with history of hypertension, lateral hip fractures, right-sided breast cancer s/p treatment including lumpectomy about 5 years ago, ESRD, dyslipidemia, DM type II, A. fib who was brought in the hospital on 07/12/2020 with altered mental status, was found to have ischemic stroke with hemorrhagic conversion, her stay was complicated by GI bleed and COVID-19 infection.  She was initially admitted under PCCM and was transferred to hospitalist service on 07/15/2020.  She has been seen so far by nephrology, neurology and PCCM.                                                                  Hospital Course      1. Acute right basal ganglia hemorrhagic stroke- with minimal left-sided weakness, this appears to be ischemic infarct with hemorrhagic conversion due to A. Fib.  Seen by stroke team, case discussed with Dr. Leonie Man on 07/15/2020.  For now baby aspirin along with home dose statin.  LDL was within acceptable limits,  echocardiogram nonacute, A1c slightly elevated will defer for PCP for better glycemic control.  Continue PT-OT and speech therapy.  Now placed on Eliquis along with home dose statin for secondary prevention, discussed with Dr. Leonie Man no new recommendations, will be discharged to SNF.  2.  A. fib RVR.  Likely found this admission.  She is currently in and out of A. fib and RVR, have started her on Cardizem, amiodarone added by cardiology, stable TSH & echocardiogram noted with preserved EF.    On Eliquis now outpatient cardiology follow-up in 1 to 2 weeks post.  3. Acute GI bleed with blood loss related anemia.  Appears to be upper GI.  S/p packed RBC transfusion on 07/12/2020 1 unit, monitor H&H, PPI, GI on board and she underwent EGD on 07/17/2020, discussed the case with Dr. Nicki Guadalajara and Dr. Leonie Man neurology, okay for heparin drip which was started on 07/18/2020 along with twice daily  PPI.  No further bleeding and switched to Eliquis on 07/19/2020.  H&H relatively stable.  4.  ESRD.  On MWF schedule.    Neurology following continue outpatient regimen post discharge.  5.  Dyslipidemia.  On statin.  6.  Essential hypertension.  Diltiazem added hydralazine for better control.  7.  Mild metabolic encephalopathy.  Worsening due to recent stroke.  Supportive care.  8.  COVID-19 infection diagnosed on 07/07/2020.  S/p antibody infusion.  Monitor.    9.  History of right-sided breast cancer.  Outpatient follow-up with oncology doses 28-year-old problem.    10.  Hip pain.  Chronic.  CT scan stable.  Monitor with supportive care.  PT OT.    Stable.  11. DM type II.    Home regimen check CBGs QA CHS at SNF monitor and adjust    Discharge diagnosis     Active Problems:   CVA (cerebral vascular accident) ([place])   Upper GI bleed   Pneumonia due to COVID-19 virus   Atrial fibrillation with RVR ([place])   Right-sided nontraumatic intracerebral hemorrhage ([facility])   ESRD on hemodialysis  ([place])   Gastritis and gastroduodenitis    Discharge instructions    Discharge Instructions    Discharge instructions   Complete by: As directed    Follow with Primary MD [name], MD in 7 days   Get CBC, CMP, 2 view Chest X ray -  checked next visit within 1 week by Primary MD or SNF MD.  Activity: As tolerated with Full fall precautions use walker/cane & assistance as needed  Disposition SNF  Diet:  Soft diet low carbohydrate diet with 1.5 L restriction per day.  CBGs - QAC-HS  Special Instructions: If you have smoked or chewed Tobacco  in the last 2 yrs please stop smoking, stop any regular Alcohol  and or any Recreational drug use.  On your next visit with your primary care physician please Get Medicines reviewed and adjusted.  Please request your Prim.MD to go over all Hospital Tests and Procedure/Radiological results at the follow up, please get all Hospital records sent to your Prim MD by signing hospital release before you go home.  If you experience worsening of your admission symptoms, develop shortness of breath, life threatening emergency, suicidal or homicidal thoughts you must seek medical attention immediately by calling 911 or calling your MD immediately  if symptoms less severe.  You Must read complete instructions/literature along with all the possible adverse reactions/side effects for all the Medicines you take and that have been prescribed to you. Take any new Medicines after you have completely understood and accpet all the possible adverse reactions/side effects.   Increase activity slowly   Complete by: As directed       Discharge Medications   Allergies as of 07/22/2020   No Known Allergies     Medication List    STOP taking these medications   amLODipine 10 MG tablet Commonly known as: NORVASC   azithromycin 250 MG tablet Commonly known as: ZITHROMAX   dexamethasone 6 MG tablet Commonly known as: DECADRON   dextromethorphan-guaiFENesin  30-600 MG 12hr tablet Commonly known as: MUCINEX DM   metoprolol succinate 100 MG 24 hr tablet Commonly known as: TOPROL-XL     TAKE these medications   amiodarone 200 MG tablet Commonly known as: PACERONE Take 1 tablet (200 mg total) by mouth daily.   apixaban 2.5 MG Tabs tablet Commonly known as: ELIQUIS Take 1 tablet (2.5 mg total)  by mouth 2 (two) times daily.   ascorbic acid 500 MG tablet Commonly known as: VITAMIN C Take 500 mg by mouth 2 (two) times daily.   atorvastatin 20 MG tablet Commonly known as: LIPITOR Take 20 mg by mouth at bedtime.   CALTRATE 600 PO Take 2 tablets by mouth daily.   diltiazem 120 MG 24 hr capsule Commonly known as: CARDIZEM CD Take 1 capsule (120 mg total) by mouth daily.   glipiZIDE 2.5 MG 24 hr tablet Commonly known as: GLUCOTROL XL Take 2.5 mg by mouth daily.   hydrALAZINE 25 MG tablet Commonly known as: APRESOLINE Take 1 tablet (25 mg total) by mouth 2 (two) times daily.   Insulin Degludec-Liraglutide 100-3.6 UNIT-MG/ML Sopn Commonly known as: XULTOPHY Inject 44 Units into the skin every morning.   letrozole 2.5 MG tablet Commonly known as: FEMARA Take 1 tablet (2.5 mg total) by mouth daily. What changed: when to take this   pantoprazole 40 MG tablet Commonly known as: PROTONIX Take 1 tablet (40 mg total) by mouth 2 (two) times daily.   sevelamer carbonate 800 MG tablet Commonly known as: RENVELA Take 800 mg by mouth 3 (three) times daily with meals.   Vitamin D 50 MCG (2000 UT) tablet Take 2,000 Units by mouth daily.   zinc sulfate 220 (50 Zn) MG capsule Take 220 mg by mouth 2 (two) times daily.            Durable Medical Equipment  (From admission, onward)         Start     Ordered   07/21/20 0818  For home use only DME Walker rolling  Once       Comments: 5 wheel  Question Answer Comment  Walker: With [place] Wheels   Patient needs a walker to treat with the following condition Weakness      07/21/20  0817   07/21/20 0818  For home use only DME 3 n 1  Once        07/21/20 9390           Follow-up Information    [name], MD Follow up on 08/14/2020.   Specialties: Cardiology, Radiology Why: 2:45 pm with his Nurse Practitioner Contact information: [address] [place] [address] [phone]        [name], MD. Schedule an appointment as soon as possible for a visit in 1 week(s).   Specialty: Family Medicine Contact information: [place] [address] [phone]               Major procedures and Radiology Reports - PLEASE review detailed and final reports thoroughly  -       DG Chest 1 View  Result Date: 07/12/2020 CLINICAL DATA:  Patient with history of COVID-19. History of GI bleed. EXAM: CHEST  1 VIEW COMPARISON:  Chest radiograph 07/10/2020. FINDINGS: Stable right-sided dialysis catheter. Monitoring leads overlie the patient. Stable cardiomegaly. Aortic atherosclerosis. Site oval increased patchy opacities left mid lower lung. No definite pleural effusion or pneumothorax. Thoracic spine degenerative changes. Surgical clips right axilla. IMPRESSION: Increasing patchy opacities left mid and lower lung may represent infection in the appropriate clinical setting or potentially atelectasis. Electronically Signed   By: Lovey Newcomer M.D.   On: 07/12/2020 11:24   CT HEAD WO CONTRAST  Result Date: 07/16/2020 CLINICAL DATA:  Stroke follow-up. EXAM: CT HEAD WITHOUT CONTRAST TECHNIQUE: Contiguous axial images were obtained from the base of  the skull through the vertex without intravenous contrast. COMPARISON:  07/13/2020 head CT and MRI FINDINGS: Brain: Approximately 1 cm hyperdense focus at the posterior aspect of the right lentiform nucleus corresponding to the previously demonstrated hemorrhage is unchanged in size. There is no associated edema. No new intracranial hemorrhage, acute large territory infarct, mass, midline  shift, or extra-axial fluid collection is identified. Mild cerebral atrophy is within normal limits for age. Hypodensities in the cerebral white matter bilaterally are unchanged and nonspecific but compatible with mild to moderate chronic small vessel ischemic disease. Vascular: Calcified atherosclerosis at the skull base. Skull: No fracture or suspicious osseous lesion. Sinuses/Orbits: Moderate mucosal thickening in the frontal sinuses. Small right mastoid effusion. Bilateral cataract extraction. Other: None. IMPRESSION: 1. Unchanged 1 cm right basal ganglia hemorrhage. 2. No new intracranial abnormality. Electronically Signed   By: Logan Bores M.D.   On: 07/16/2020 15:15   CT HEAD WO CONTRAST  Result Date: 07/13/2020 CLINICAL DATA:  Follow-up examination of basal ganglia hemorrhage. EXAM: CT HEAD WITHOUT CONTRAST TECHNIQUE: Contiguous axial images were obtained from the base of the skull through the vertex without intravenous contrast. COMPARISON:  Prior CT from 07/12/2020 FINDINGS: Brain: Previously identified intraparenchymal hemorrhage at the posterior right lentiform nucleus again seen. Bleed has mildly dispersed and is less dense in appearance as compared to previous exam, measuring overall similar in size at 10 x 7 x 10 mm, previously 10 x 5 x 10 mm when measured in a similar fashion. No significant surrounding edema or regional mass effect. No other acute or interval intracranial hemorrhage. No other acute large vessel territory infarct. No mass lesion, mass effect, or midline shift. No hydrocephalus or extra-axial fluid collection. Atrophy with chronic microvascular ischemic disease again noted. Vascular: No hyperdense vessel. Calcified atherosclerosis at the skull base. Skull: Scalp soft tissues and calvarium within normal limits. Sinuses/Orbits: Globes and orbital soft tissues demonstrate no acute finding. Mild mucoperiosteal thickening noted throughout the visualized paranasal sinuses. Mastoid air  cells remain clear. Other: None. IMPRESSION: 1. Normal expected interval evolution of acute intraparenchymal hemorrhage positioned at the posterior right lentiform nucleus, overall similar in size but with slightly decreased density as compared to previous. No significant surrounding edema or regional mass effect. 2. No other new acute intracranial abnormality. 3. Atrophy with chronic microvascular ischemic disease. Electronically Signed   By: Jeannine Boga M.D.   On: 07/13/2020 18:45   CT PELVIS WO CONTRAST  Result Date: 07/14/2020 CLINICAL DATA:  Right hip pain EXAM: CT PELVIS WITHOUT CONTRAST TECHNIQUE: Multidetector CT imaging of the pelvis was performed following the standard protocol without intravenous contrast. COMPARISON:  Radiographs 07/12/2020 FINDINGS: Urinary Tract: Bladder and distal ureters obscured by streak artifact from the patient's bilateral hip hardware. Bowel:  Unremarkable, rectum partially obscured by streak artifact. Vascular/Lymphatic: Aortoiliac atherosclerotic vascular disease. No pathologic adenopathy identified. Reproductive:  Unremarkable Other:  No supplemental non-categorized findings. Musculoskeletal: Right hip screw with IM nail. Single distal interlocking screw along the IM nail. Inter trochanteric deformity from old fracture, no acute fracture is identified. Severe degenerative right hip arthropathy with loss of articular space and spurring. Left hip hemiarthroplasty without discrete complicating feature identified. Mild spurring along the SI joints. There is lower lumbar spondylosis and degenerative disc disease leading to moderate left foraminal stenosis at L5-S1. IMPRESSION: 1. Severe degenerative right hip arthropathy. 2. Right hip screw with IM nail. Right intertrochanteric deformity from old fracture. No acute fracture is identified. 3. Left hip hemiarthroplasty without discrete complicating feature identified.  4. Lower lumbar spondylosis and degenerative disc  disease leading to moderate left foraminal stenosis at L5-S1. 5. Aortic atherosclerosis. Aortic Atherosclerosis (ICD10-I70.0). Electronically Signed   By: Van Clines M.D.   On: 07/14/2020 14:12   MR ANGIO HEAD WO CONTRAST  Result Date: 07/13/2020 CLINICAL DATA:  Follow-up examination for acute stroke. EXAM: MRI HEAD WITHOUT CONTRAST MRA HEAD WITHOUT CONTRAST TECHNIQUE: Multiplanar, multiecho pulse sequences of the brain and surrounding structures were obtained without intravenous contrast. Angiographic images of the head were obtained using MRA technique without contrast. COMPARISON:  Prior head CT from 07/12/2020 as well as previous brain MRI from 08/17/2016. FINDINGS: MRI HEAD FINDINGS Brain: Diffuse prominence of the CSF containing spaces compatible with generalized age-related cerebral atrophy. Patchy and confluent T2/FLAIR hyperintensity within the periventricular and deep white matter both cerebral hemispheres most likely related chronic microvascular ischemic disease. Patchy involvement of the pons. Overall, these changes are mildly progressed as compared to 2018. Few small remote lacunar type infarcts noted about the basal ganglia, most notable which present at the right caudothalamic groove. Previously identified hemorrhage centered at the posterior right lentiform nucleus/right internal capsule again seen, stable in size measuring up to approximately 1 cm in greatest dimension. This is somewhat difficult to visualized by MR, and is most notable on T1 weighted sequence (series 16, image 26). No significant surrounding edema or regional mass effect. No other acute intracranial hemorrhage. Few scattered chronic micro hemorrhages noted at the right cerebellum and pons, likely small vessel/hypertensive in nature. Single punctate 4 mm focus of diffusion abnormality involving the subcortical white matter of the right parietal corona radiata noted, likely a small acute to subacute small vessel type  ischemic infarct (series 5, image 74). No associated hemorrhage or mass effect. No other diffusion abnormality to suggest acute or subacute ischemia. Gray-white matter differentiation otherwise maintained. No mass lesion, mass effect, or midline shift. No hydrocephalus or extra-axial fluid collection. Pituitary gland suprasellar region normal. Midline structures intact. 1.3 cm ovoid well-circumscribed lesion positioned at the left petrous apex demonstrating heterogeneous T2/FLAIR signal intensity with hyperintense T1 signal intensity noted (series 16, image 14), nonspecific, but could reflect a small cholesterol granuloma versus asymmetric fatty marrow. Finding is relatively stable from previous MRI from 2018, and of doubtful significance. Vascular: Major intracranial vascular flow voids are maintained. Skull and upper cervical spine: Craniocervical junction within normal limits. Multilevel degenerative spondylosis noted within the visualized upper cervical spine with resultant mild-to-moderate spinal stenosis. Bone marrow signal intensity within normal limits. No scalp soft tissue abnormality. Sinuses/Orbits: Patient status post bilateral ocular lens replacement. Globes and orbital soft tissues demonstrate no acute finding. Mild scattered mucosal thickening noted throughout the paranasal sinuses. Trace bilateral mastoid effusions, of doubtful significance. Visualized nasopharynx within normal limits. Other: None. MRA HEAD FINDINGS ANTERIOR CIRCULATION: Examination degraded by motion artifact. Visualized distal cervical segments of the internal carotid arteries are patent with symmetric antegrade flow. Petrous segments widely patent. Atheromatous irregularity throughout the carotid siphons with associated mild multifocal narrowing. A1 segments patent bilaterally. Normal anterior communicating artery complex. Anterior cerebral arteries patent to their distal aspects without high-grade stenosis. Short-segment severe  stenosis at the distal left M1 segment/left MCA bifurcation (series 1036, image 12). Left MCA branches are diffusely irregular but perfused distally. Right M1 segment patent proximally. Focal signal cutoff at the mid-distal right M1 segment into the bifurcation (series 1036, image 11). This likely reflects a severe high-grade stenosis, as the right MCA branches are perfused distally. Diffuse atheromatous irregularity seen throughout  the right MCA branches as well. Proximally. POSTERIOR CIRCULATION: Vertebral arteries largely code dominant and patent to the vertebrobasilar junction. Both PICA origins patent and normal. Basilar patent to its distal aspect without stenosis. Superior cerebral arteries patent bilaterally. Both PCAs primarily supplied via the basilar. Severe long segment stenosis involving the mid-distal right P2 segment (series 1046, image 15). Severe multifocal distal left P2 and P3 stenoses present as well (series 1046, image 14). PCAs remain perfused to their distal aspects. No intracranial aneurysm or other vascular abnormality. IMPRESSION: MRI HEAD IMPRESSION: 1. Unchanged 1 cm hemorrhage centered at the posterior right lentiform nucleus/right internal capsule. No significant surrounding edema or regional mass effect. 2. 4 mm focus of diffusion abnormality involving the subcortical white matter of the right parietal corona radiata, consistent with a small acute to subacute small vessel type ischemic infarct. No associated hemorrhage. 3. Underlying age-related cerebral atrophy with moderate chronic microvascular ischemic disease, mildly progressed as compared to 2018. MRA HEAD IMPRESSION: 1. Technically limited exam due to motion artifact. 2. Negative intracranial MRA for large vessel occlusion. 3. Focal severe high-grade stenoses involving the mid-distal right M1 segment and distal left M1 segment/left MCA bifurcation. Left MCA branches remain perfused distally. 4. Severe multifocal bilateral P2 and  distal left P3 stenoses as above. Electronically Signed   By: Jeannine Boga M.D.   On: 07/13/2020 19:16   MR BRAIN WO CONTRAST  Result Date: 07/13/2020 CLINICAL DATA:  Follow-up examination for acute stroke. EXAM: MRI HEAD WITHOUT CONTRAST MRA HEAD WITHOUT CONTRAST TECHNIQUE: Multiplanar, multiecho pulse sequences of the brain and surrounding structures were obtained without intravenous contrast. Angiographic images of the head were obtained using MRA technique without contrast. COMPARISON:  Prior head CT from 07/12/2020 as well as previous brain MRI from 08/17/2016. FINDINGS: MRI HEAD FINDINGS Brain: Diffuse prominence of the CSF containing spaces compatible with generalized age-related cerebral atrophy. Patchy and confluent T2/FLAIR hyperintensity within the periventricular and deep white matter both cerebral hemispheres most likely related chronic microvascular ischemic disease. Patchy involvement of the pons. Overall, these changes are mildly progressed as compared to 2018. Few small remote lacunar type infarcts noted about the basal ganglia, most notable which present at the right caudothalamic groove. Previously identified hemorrhage centered at the posterior right lentiform nucleus/right internal capsule again seen, stable in size measuring up to approximately 1 cm in greatest dimension. This is somewhat difficult to visualized by MR, and is most notable on T1 weighted sequence (series 16, image 26). No significant surrounding edema or regional mass effect. No other acute intracranial hemorrhage. Few scattered chronic micro hemorrhages noted at the right cerebellum and pons, likely small vessel/hypertensive in nature. Single punctate 4 mm focus of diffusion abnormality involving the subcortical white matter of the right parietal corona radiata noted, likely a small acute to subacute small vessel type ischemic infarct (series 5, image 74). No associated hemorrhage or mass effect. No other diffusion  abnormality to suggest acute or subacute ischemia. Gray-white matter differentiation otherwise maintained. No mass lesion, mass effect, or midline shift. No hydrocephalus or extra-axial fluid collection. Pituitary gland suprasellar region normal. Midline structures intact. 1.3 cm ovoid well-circumscribed lesion positioned at the left petrous apex demonstrating heterogeneous T2/FLAIR signal intensity with hyperintense T1 signal intensity noted (series 16, image 14), nonspecific, but could reflect a small cholesterol granuloma versus asymmetric fatty marrow. Finding is relatively stable from previous MRI from 2018, and of doubtful significance. Vascular: Major intracranial vascular flow voids are maintained. Skull and upper cervical spine: Craniocervical  junction within normal limits. Multilevel degenerative spondylosis noted within the visualized upper cervical spine with resultant mild-to-moderate spinal stenosis. Bone marrow signal intensity within normal limits. No scalp soft tissue abnormality. Sinuses/Orbits: Patient status post bilateral ocular lens replacement. Globes and orbital soft tissues demonstrate no acute finding. Mild scattered mucosal thickening noted throughout the paranasal sinuses. Trace bilateral mastoid effusions, of doubtful significance. Visualized nasopharynx within normal limits. Other: None. MRA HEAD FINDINGS ANTERIOR CIRCULATION: Examination degraded by motion artifact. Visualized distal cervical segments of the internal carotid arteries are patent with symmetric antegrade flow. Petrous segments widely patent. Atheromatous irregularity throughout the carotid siphons with associated mild multifocal narrowing. A1 segments patent bilaterally. Normal anterior communicating artery complex. Anterior cerebral arteries patent to their distal aspects without high-grade stenosis. Short-segment severe stenosis at the distal left M1 segment/left MCA bifurcation (series 1036, image 12). Left MCA  branches are diffusely irregular but perfused distally. Right M1 segment patent proximally. Focal signal cutoff at the mid-distal right M1 segment into the bifurcation (series 1036, image 11). This likely reflects a severe high-grade stenosis, as the right MCA branches are perfused distally. Diffuse atheromatous irregularity seen throughout the right MCA branches as well. Proximally. POSTERIOR CIRCULATION: Vertebral arteries largely code dominant and patent to the vertebrobasilar junction. Both PICA origins patent and normal. Basilar patent to its distal aspect without stenosis. Superior cerebral arteries patent bilaterally. Both PCAs primarily supplied via the basilar. Severe long segment stenosis involving the mid-distal right P2 segment (series 1046, image 15). Severe multifocal distal left P2 and P3 stenoses present as well (series 1046, image 14). PCAs remain perfused to their distal aspects. No intracranial aneurysm or other vascular abnormality. IMPRESSION: MRI HEAD IMPRESSION: 1. Unchanged 1 cm hemorrhage centered at the posterior right lentiform nucleus/right internal capsule. No significant surrounding edema or regional mass effect. 2. 4 mm focus of diffusion abnormality involving the subcortical white matter of the right parietal corona radiata, consistent with a small acute to subacute small vessel type ischemic infarct. No associated hemorrhage. 3. Underlying age-related cerebral atrophy with moderate chronic microvascular ischemic disease, mildly progressed as compared to 2018. MRA HEAD IMPRESSION: 1. Technically limited exam due to motion artifact. 2. Negative intracranial MRA for large vessel occlusion. 3. Focal severe high-grade stenoses involving the mid-distal right M1 segment and distal left M1 segment/left MCA bifurcation. Left MCA branches remain perfused distally. 4. Severe multifocal bilateral P2 and distal left P3 stenoses as above. Electronically Signed   By: Jeannine Boga M.D.   On:  07/13/2020 19:16   DG Pelvis Portable  Result Date: 07/12/2020 CLINICAL DATA:  Right hip pain.  No reported injury. EXAM: PORTABLE PELVIS 1-2 VIEWS COMPARISON:  07/21/2018 outside pelvic radiographs FINDINGS: Partially visualized left hip hemiarthroplasty and partially visualized fixation hardware in the proximal right femur with no evidence of hardware fracture or loosening. No evidence of hip dislocation on this single frontal view. No osseous fracture. No focal osseous lesions. Moderate right hip osteoarthritis. No suspicious focal osseous lesions. No pelvic diastasis. IMPRESSION: No acute osseous abnormality. No evidence of hip dislocation on this single frontal view. Moderate right hip osteoarthritis. Partially visualized left hip hemiarthroplasty and proximal right femur fixation hardware, with no evidence of hardware complication. Electronically Signed   By: Ilona Sorrel M.D.   On: 07/12/2020 12:33   DG CHEST PORT 1 VIEW  Result Date: 07/15/2020 CLINICAL DATA:  COVID-19. EXAM: PORTABLE CHEST 1 VIEW COMPARISON:  07/12/2020. FINDINGS: Right dual-lumen catheter stable position. Stable cardiomegaly. Progressive bilateral pulmonary infiltrates/edema.  Tiny bilateral pleural effusions cannot be excluded. No pneumothorax. Degenerative change thoracic spine. Surgical clips right chest. IMPRESSION: 1. Right dual-lumen catheter in stable position. 2. Stable cardiomegaly. 3. Progressive bilateral pulmonary infiltrates/edema. Tiny bilateral pleural effusions cannot be excluded. Electronically Signed   By: Marcello Moores  Register   On: 07/15/2020 05:24   DG Abd Portable 1V  Result Date: 07/13/2020 CLINICAL DATA:  Evaluate orogastric tube EXAM: PORTABLE ABDOMEN - 1 VIEW COMPARISON:  July 13, 2020 FINDINGS: The OG tube side port and distal tip are in the region the stomach. IMPRESSION: The OG tube is in good position. Electronically Signed   By: Dorise Bullion III M.D   On: 07/13/2020 11:04   DG Abd Portable  1V  Result Date: 07/13/2020 CLINICAL DATA:  Vomiting. EXAM: PORTABLE ABDOMEN - 1 VIEW COMPARISON:  None. FINDINGS: No evidence of dilated bowel loops. Aortic atherosclerotic calcification noted. Compression screw noted in the right hip. Left hip prosthesis is also seen. IMPRESSION: Unremarkable bowel gas pattern.  No acute findings. Electronically Signed   By: Marlaine Hind M.D.   On: 07/13/2020 05:08   ECHOCARDIOGRAM COMPLETE  Result Date: 07/14/2020    ECHOCARDIOGRAM REPORT   Patient Name:   AVRIANA JOO Date of Exam: 07/14/2020 Medical Rec #:  [id number]     Height:       64.0 in Accession #:    [id number]    Weight:       174.6 lb Date of Birth:  Sep 07, 1940      BSA:          1.847 m Patient Age:    67 years      BP:           114/75 mmHg Patient Gender: F             HR:           77 bpm. Exam Location:  Inpatient Procedure: 2D Echo, Color Doppler and Cardiac Doppler Indications:    Stroke 434.91 / I163.9  History:        Patient has no prior history of Echocardiogram examinations.                 Risk Factors:Dyslipidemia and Diabetes.  Sonographer:    Bernadene Person RDCS Referring Phys: [place]  1. Left ventricular ejection fraction, by estimation, is 60 to 65%. The left ventricle has normal function. The left ventricle has no regional wall motion abnormalities. There is moderate left ventricular hypertrophy. Left ventricular diastolic parameters are consistent with Grade I diastolic dysfunction (impaired relaxation). Elevated left ventricular end-diastolic pressure. The E/e' is 63.  2. Right ventricular systolic function is normal. The right ventricular size is normal.  3. The pericardial effusion is posterior to the left ventricle.  4. The mitral valve is abnormal. Mild mitral valve regurgitation. Moderate mitral annular calcification.  5. The aortic valve is tricuspid. Aortic valve regurgitation is not visualized. Mild aortic valve sclerosis is present, with no evidence of  aortic valve stenosis. Comparison(s): No prior Echocardiogram. FINDINGS  Left Ventricle: Left ventricular ejection fraction, by estimation, is 60 to 65%. The left ventricle has normal function. The left ventricle has no regional wall motion abnormalities. The left ventricular internal cavity size was normal in size. There is  moderate left ventricular hypertrophy. Left ventricular diastolic parameters are consistent with Grade I diastolic dysfunction (impaired relaxation). Elevated left ventricular end-diastolic pressure. The E/e' is 20. Right Ventricle: The right ventricular size is normal.  No increase in right ventricular wall thickness. Right ventricular systolic function is normal. Left Atrium: Left atrial size was normal in size. Right Atrium: Right atrial size was normal in size. Pericardium: Trivial pericardial effusion is present. The pericardial effusion is posterior to the left ventricle. Mitral Valve: The mitral valve is abnormal. There is mild thickening of the mitral valve leaflet(s). Moderate mitral annular calcification. Mild mitral valve regurgitation, with posteriorly-directed jet. Tricuspid Valve: The tricuspid valve is grossly normal. Tricuspid valve regurgitation is trivial. Aortic Valve: The aortic valve is tricuspid. Aortic valve regurgitation is not visualized. Mild aortic valve sclerosis is present, with no evidence of aortic valve stenosis. Pulmonic Valve: The pulmonic valve was normal in structure. Pulmonic valve regurgitation is trivial. Aorta: The aortic root and ascending aorta are structurally normal, with no evidence of dilitation. Venous: The inferior vena cava was not well visualized. IAS/Shunts: No atrial level shunt detected by color flow Doppler.  LEFT VENTRICLE PLAX 2D LVIDd:         4.30 cm  Diastology LVIDs:         2.80 cm  LV e' medial:    5.66 cm/s LV PW:         1.20 cm  LV E/e' medial:  21.9 LV IVS:        1.20 cm  LV e' lateral:   6.20 cm/s LVOT diam:     2.10 cm  LV E/e'  lateral: 20.0 LV SV:         103 LV SV Index:   56 LVOT Area:     3.46 cm  RIGHT VENTRICLE RV S prime:     13.90 cm/s TAPSE (M-mode): 1.8 cm LEFT ATRIUM             Index       RIGHT ATRIUM           Index LA diam:        3.30 cm 1.79 cm/m  RA Area:     13.60 cm LA Vol (A2C):   51.0 ml 27.62 ml/m RA Volume:   28.50 ml  15.43 ml/m LA Vol (A4C):   50.1 ml 27.13 ml/m LA Biplane Vol: 51.9 ml 28.10 ml/m  AORTIC VALVE LVOT Vmax:   143.00 cm/s LVOT Vmean:  98.200 cm/s LVOT VTI:    0.296 m  AORTA Ao Root diam: 3.00 cm Ao Asc diam:  3.40 cm MITRAL VALVE                TRICUSPID VALVE MV Area (PHT): 3.21 cm     TR Peak grad:   23.2 mmHg MV Decel Time: 236 msec     TR Vmax:        241.00 cm/s MV E velocity: 124.00 cm/s MV A velocity: 120.00 cm/s  SHUNTS MV E/A ratio:  1.03         Systemic VTI:  0.30 m                             Systemic Diam: 2.10 cm Lyman Bishop MD Electronically signed by Lyman Bishop MD Signature Date/Time: 07/14/2020/2:10:04 PM    Final    CT HEAD CODE STROKE WO CONTRAST  Addendum Date: 07/12/2020   ADDENDUM REPORT: 07/12/2020 10:16 ADDENDUM: Correction.  Findings were discussed with Dr. Theda Sers. Electronically Signed   By: Margaretha Sheffield MD   On: 07/12/2020 10:16   Result Date: 07/12/2020 CLINICAL DATA:  Code stroke.  Neuro deficit, acute stroke suspected. EXAM: CT HEAD WITHOUT CONTRAST TECHNIQUE: Contiguous axial images were obtained from the base of the skull through the vertex without intravenous contrast. COMPARISON:  MRI head August 17, 2016 FINDINGS: Brain: Acute hemorrhage in the right basal ganglia, centered in the region of the posterior putamen/posterior limb of the internal capsule. The hemorrhage measures approximately 7 x 4 by 6 mm. No evidence of acute large vascular territory infarct. Patchy white matter hypoattenuation, which is nonspecific but most likely related to chronic microvascular ischemic disease. Mild generalized atrophy. No hydrocephalus. No evidence of a  mass lesion. No midline shift. Remote right cerebellar lacunar infarct. Vascular: Calcific atherosclerosis. Skull: No acute fracture. Sinuses/Orbits: Sinuses are clear.  Unremarkable orbits. Other: No mastoid effusion. IMPRESSION: 1. Acute 7 mm hemorrhage within the right basal ganglia. No substantial mass effect. 2. Chronic microvascular ischemic disease and remote right cerebellar lacunar infarct. Code stroke imaging results were communicated on 07/12/2020 at 10:00 am to provider Dr. Lorrin Goodell via telephone, who verbally acknowledged these results. Electronically Signed: By: Margaretha Sheffield MD On: 07/12/2020 10:05   VAS US CAROTID  Result Date: 07/14/2020 Carotid Arterial Duplex Study Indications:       CVA. Risk Factors:      Hyperlipidemia, Diabetes. Limitations        Today's exam was limited due to the patient's inability or                    unwillingness to cooperate. Comparison Study:  no prior Performing Technologist: Abram Sander RVS  Examination Guidelines: A complete evaluation includes B-mode imaging, spectral Doppler, color Doppler, and power Doppler as needed of all accessible portions of each vessel. Bilateral testing is considered an integral part of a complete examination. Limited examinations for reoccurring indications may be performed as noted.  Right Carotid Findings: +----------+--------+--------+--------+------------------+--------------+           PSV cm/sEDV cm/sStenosisPlaque DescriptionComments       +----------+--------+--------+--------+------------------+--------------+ CCA Prox  54      9               heterogenous                     +----------+--------+--------+--------+------------------+--------------+ CCA Distal65      16              heterogenous                     +----------+--------+--------+--------+------------------+--------------+ ICA Prox  48      17      1-39%   heterogenous                      +----------+--------+--------+--------+------------------+--------------+ ICA Distal                                          Not visualized +----------+--------+--------+--------+------------------+--------------+ ECA       91      9                                                +----------+--------+--------+--------+------------------+--------------+ +----------+--------+-------+--------------------------+-------------------+           PSV cm/sEDV cmsDescribe  Arm Pressure (mmHG) +----------+--------+-------+--------------------------+-------------------+ Subclavian               not visualized due to port                    +----------+--------+-------+--------------------------+-------------------+ +---------+--------+--------+--------------+ VertebralPSV cm/sEDV cm/sNot identified +---------+--------+--------+--------------+  Left Carotid Findings: +----------+--------+--------+--------+------------------+--------------+           PSV cm/sEDV cm/sStenosisPlaque DescriptionComments       +----------+--------+--------+--------+------------------+--------------+ CCA Prox  53      5               heterogenous                     +----------+--------+--------+--------+------------------+--------------+ CCA Distal73      10              heterogenous                     +----------+--------+--------+--------+------------------+--------------+ ICA Prox  127     16      1-39%   heterogenous                     +----------+--------+--------+--------+------------------+--------------+ ICA Distal                                          Not visualized +----------+--------+--------+--------+------------------+--------------+ ECA       220                                                      +----------+--------+--------+--------+------------------+--------------+ +----------+--------+--------+--------------+-------------------+            PSV cm/sEDV cm/sDescribe      Arm Pressure (mmHG) +----------+--------+--------+--------------+-------------------+ Subclavian                Not identified                    +----------+--------+--------+--------------+-------------------+ +---------+--------+--------+--------------+ VertebralPSV cm/sEDV cm/sNot identified +---------+--------+--------+--------------+   Summary: Right Carotid: Velocities in the right ICA are consistent with a 1-39% stenosis. Left Carotid: Velocities in the left ICA are consistent with a 1-39% stenosis. Vertebrals: Bilateral vertebral arteries were not visualized. *See table(s) above for measurements and observations.  Electronically signed by Antony Contras MD on 07/14/2020 at 1:04:27 PM.    Final     Micro Results     Recent Results (from the past 240 hour(s))  MRSA PCR Screening     Status: Abnormal   Collection Time: 07/12/20  6:37 PM   Specimen: Nasal Mucosa; Nasopharyngeal  Result Value Ref Range Status   MRSA by PCR POSITIVE (A) NEGATIVE Final    Comment:        The GeneXpert MRSA Assay (FDA approved for NASAL specimens only), is one component of a comprehensive MRSA colonization surveillance program. It is not intended to diagnose MRSA infection nor to guide or monitor treatment for MRSA infections. RESULT CALLED TO, READ BACK BY AND VERIFIED WITH: J. Dominica Severin 2139 07/12/2020 Mena Goes Performed at [facility], [place] [address] [place], [place] [address]   SARS CORONAVIRUS 2 (TAT 6-24 HRS) Nasopharyngeal Nasopharyngeal Swab     Status: Abnormal   Collection Time: 07/21/20  1:34 PM   Specimen: Nasopharyngeal  Swab  Result Value Ref Range Status   SARS Coronavirus 2 POSITIVE (A) NEGATIVE Final    Comment: RESULT CALLED TO, READ BACK BY AND VERIFIED WITH: F.AFOR,RN @2142  07/21/2020 VANG.J (NOTE) SARS-CoV-2 target nucleic acids are DETECTED.  The SARS-CoV-2 RNA is generally detectable in upper and lower respiratory  specimens during the acute phase of infection. Positive results are indicative of the presence of SARS-CoV-2 RNA. Clinical correlation with patient history and other diagnostic information is  necessary to determine patient infection status. Positive results do not rule out bacterial infection or co-infection with other viruses.  The expected result is Negative.  Fact Sheet for Patients: SugarRoll.be  Fact Sheet for Healthcare Providers: https://www.woods-mathews.com/  This test is not yet approved or cleared by the [place] FDA and  has been authorized for detection and/or diagnosis of SARS-CoV-2 by FDA under an Emergency Use Authorization (EUA). This EUA will remain  in effect (meaning this test can be used) f or the duration of the COVID-19 declaration under Section 564(b)(1) of the Act, 21 U.S.C. section 360bbb-3(b)(1), unless the authorization is terminated or revoked sooner.   Performed at [facility], [place] [address], [place], [place] [address]     Today   Subjective    Shanquita Ronning today has no headache,no chest abdominal pain,no new weakness tingling or numbness, feels much better     Objective   Blood pressure (!) 156/76, pulse 75, temperature 98.4 F (36.9 C), temperature source Oral, resp. rate 19, height 5\' 4"  (1.626 m), weight 73.8 kg, SpO2 100 %.   Intake/Output Summary (Last 24 hours) at 07/22/2020 0956 Last data filed at 07/22/2020 0913 Gross per 24 hour  Intake 340 ml  Output 1500 ml  Net -1160 ml    Exam  Awake Alert, No new F.N deficits, Normal affect [place].AT,PERRAL Supple Neck,No JVD, No cervical lymphadenopathy appriciated.  Symmetrical Chest wall movement, Good air movement bilaterally, CTAB RRR,No Gallops,Rubs or new Murmurs, No Parasternal Heave +ve B.Sounds, Abd Soft, Non tender, No organomegaly appriciated, No rebound -guarding or rigidity. No Cyanosis, Clubbing or edema, No new Rash or  bruise   Data Review   CBC w Diff:  Lab Results  Component Value Date   WBC 5.5 07/22/2020   HGB 10.1 (L) 07/22/2020   HGB 14.5 04/14/2015   HCT 32.2 (L) 07/22/2020   HCT 42.4 04/14/2015   PLT 181 07/22/2020   PLT 197 04/14/2015   LYMPHOPCT 16 07/22/2020   LYMPHOPCT 19.8 04/14/2015   MONOPCT 15 07/22/2020   MONOPCT 9.8 04/14/2015   EOSPCT 2 07/22/2020   EOSPCT 1.6 04/14/2015   BASOPCT 0 07/22/2020   BASOPCT 0.6 04/14/2015    CMP:  Lab Results  Component Value Date   NA 133 (L) 07/21/2020   NA 138 04/14/2015   K 4.5 07/21/2020   K 4.0 04/14/2015   CL 97 (L) 07/21/2020   CO2 24 07/21/2020   CO2 27 04/14/2015   BUN 27 (H) 07/21/2020   BUN 13.0 04/14/2015   CREATININE 5.38 (H) 07/21/2020   CREATININE 1.3 (H) 04/14/2015   PROT 6.6 07/19/2020   PROT 7.1 04/14/2015   ALBUMIN 2.7 (L) 07/19/2020   ALBUMIN 3.4 (L) 04/14/2015   BILITOT 0.9 07/19/2020   BILITOT 0.79 04/14/2015   ALKPHOS 80 07/19/2020   ALKPHOS 139 04/14/2015   AST 45 (H) 07/19/2020   AST 13 04/14/2015   ALT 31 07/19/2020   ALT 17 04/14/2015  .   Total Time in preparing  paper work, data evaluation and todays exam - 9 minutes  Lala Lund M.D on 07/22/2020 at 9:56 AM  [facility]

## 2020-07-22 NOTE — TOC Progression Note (Signed)
Transition of Care [facility]) - Progression Note    Patient Details  Name: CERENITY GOSHORN MRN: [id number] Date of Birth: [birth year]  Transition of Care [facility]) CM/SW [place], LCSW Phone Number: 07/22/2020, 12:13 PM  Clinical Narrative:    Per renal navigator, no [facility] seats are available for patient at this time for patient to be able to go to [place] as they cannot transport to HD in [place]. CSW contacted [facility] (they are the only possible SNF in [place] to accept patient) to see if they will be able to accept patient once she is off of isolation on 07/29/19 per [facility] guidelines. [place], requested CSW contact her again Friday to see if they can accept patient.       Barriers to Discharge: Continued Medical Work up,SNF Pending bed offer,Insurance Authorization  Expected Discharge Plan and Services   In-house Referral: Clinical Social Work     Living arrangements for the past 2 months: Single Family Home Expected Discharge Date: 07/22/20                                     Social Determinants of Health (SDOH) Interventions    Readmission Risk Interventions No flowsheet data found.

## 2020-07-23 DIAGNOSIS — I4891 Unspecified atrial fibrillation: Secondary | ICD-10-CM | POA: Diagnosis not present

## 2020-07-23 LAB — CBC
HCT: 32.5 % — ABNORMAL LOW (ref 36.0–46.0)
Hemoglobin: 10.3 g/dL — ABNORMAL LOW (ref 12.0–15.0)
MCH: 28.7 pg (ref 26.0–34.0)
MCHC: 31.7 g/dL (ref 30.0–36.0)
MCV: 90.5 fL (ref 80.0–100.0)
Platelets: 216 10*3/uL (ref 150–400)
RBC: 3.59 MIL/uL — ABNORMAL LOW (ref 3.87–5.11)
RDW: 16.8 % — ABNORMAL HIGH (ref 11.5–15.5)
WBC: 6.5 10*3/uL (ref 4.0–10.5)
nRBC: 0 % (ref 0.0–0.2)

## 2020-07-23 LAB — GLUCOSE, CAPILLARY
Glucose-Capillary: 188 mg/dL — ABNORMAL HIGH (ref 70–99)
Glucose-Capillary: 183 mg/dL — ABNORMAL HIGH (ref 70–99)
Glucose-Capillary: 231 mg/dL — ABNORMAL HIGH (ref 70–99)
Glucose-Capillary: 196 mg/dL — ABNORMAL HIGH (ref 70–99)
Glucose-Capillary: 290 mg/dL — ABNORMAL HIGH (ref 70–99)

## 2020-07-23 LAB — RENAL FUNCTION PANEL
Albumin: 3 g/dL — ABNORMAL LOW (ref 3.5–5.0)
Anion gap: 14 (ref 5–15)
BUN: 43 mg/dL — ABNORMAL HIGH (ref 8–23)
CO2: 23 mmol/L (ref 22–32)
Calcium: 8.9 mg/dL (ref 8.9–10.3)
Chloride: 95 mmol/L — ABNORMAL LOW (ref 98–111)
Creatinine, Ser: 5.99 mg/dL — ABNORMAL HIGH (ref 0.44–1.00)
GFR, Estimated: 7 mL/min — ABNORMAL LOW
Glucose, Bld: 182 mg/dL — ABNORMAL HIGH (ref 70–99)
Phosphorus: 5.6 mg/dL — ABNORMAL HIGH (ref 2.5–4.6)
Potassium: 4.4 mmol/L (ref 3.5–5.1)
Sodium: 132 mmol/L — ABNORMAL LOW (ref 135–145)

## 2020-07-23 MED ORDER — INSULIN GLARGINE 100 UNIT/ML ~~LOC~~ SOLN
20.0000 [IU] | Freq: Every day | SUBCUTANEOUS | Status: DC
  Administered 2020-07-23 – 2020-07-24 (×2): 20 [IU] via SUBCUTANEOUS
  Filled 2020-07-23 (×2): qty 0.2

## 2020-07-23 MED ORDER — HEPARIN SODIUM (PORCINE) 1000 UNIT/ML DIALYSIS: 3500.0000 [IU] | Freq: Once | INTRAMUSCULAR | Status: AC

## 2020-07-23 MED ORDER — HEPARIN SODIUM (PORCINE) 1000 UNIT/ML IJ SOLN
INTRAMUSCULAR | Status: AC
  Administered 2020-07-23: 3800 [IU] via INTRAVENOUS
  Filled 2020-07-23: qty 4

## 2020-07-23 MED ORDER — HEPARIN SODIUM (PORCINE) 1000 UNIT/ML IJ SOLN
INTRAMUSCULAR | Status: AC
  Administered 2020-07-23: 08:00:00 3500 [IU] via INTRAVENOUS_CENTRAL
  Filled 2020-07-23: qty 4

## 2020-07-23 NOTE — Plan of Care (Signed)
Patient is currently resting in chair. Refused to get in bed d/t chronic hip pain. OOB w/ walker to BR. VSS. Remains on room air. Call bell within reach. Frequent hourly rounding. All needs met at this time.  Problem: Education: Goal: Knowledge of risk factors and measures for prevention of condition will improve Outcome: Progressing   Problem: Coping: Goal: Psychosocial and spiritual needs will be supported Outcome: Progressing   Problem: Respiratory: Goal: Will maintain a patent airway Outcome: Progressing Goal: Complications related to the disease process, condition or treatment will be avoided or minimized Outcome: Progressing

## 2020-07-23 NOTE — Progress Notes (Signed)
PROGRESS NOTE                                                                                                                                                                                                             Patient Demographics:    [name], is a 79 y.o. female, DOB - [birth year], WLN:[id number]  Outpatient Primary MD for the patient is [name], MD    LOS - 11  Admit date - 07/12/2020    Chief Complaint  Patient presents with  . Code Stroke  . GI Bleeding       Brief Narrative (HPI from H&P) - this is a 79 year old female with history of hypertension, lateral hip fractures, right-sided breast cancer s/p treatment including lumpectomy about 5 years ago, ESRD, dyslipidemia, DM type II, A. fib who was brought in the hospital on 07/12/2020 with altered mental status, was found to have ischemic stroke with hemorrhagic conversion, her stay was complicated by GI bleed and COVID-19 infection.  She was initially admitted under PCCM and was transferred to hospitalist service on 07/15/2020.  She has been seen so far by nephrology, neurology and PCCM.   Subjective:   Patient seen and examined in dialysis unit.  She complains of cough but no other complaint.  Denied any chest pain or shortness of breath.  She is alert and oriented.   Assessment  & Plan :     1. Acute right basal ganglia hemorrhagic stroke- with minimal left-sided weakness, this appears to be ischemic infarct with hemorrhagic conversion due to A. Fib.  Seen by stroke team, case discussed with Dr. Leonie Man on 07/15/2020.  For now baby aspirin along with home dose statin.  LDL was within acceptable limits, echocardiogram nonacute, A1c slightly elevated will defer for PCP for better glycemic control.  Continue PT-OT and speech therapy.  Now placed on Eliquis along with home dose statin for secondary prevention, discussed with Dr. Leonie Man no new recommendations,  will be discharged to SNF.  2.  A. fib RVR.  Likely found this admission.  She is currently in and out of A. fib and RVR, have started her on Cardizem, amiodarone added by cardiology, stable TSH & echocardiogram noted with preserved EF.    On Eliquis now outpatient cardiology follow-up in 1 to 2 weeks post discharge.  3. Acute GI  bleed with blood loss related anemia.  Appears to be upper GI.  S/p packed RBC transfusion on 07/12/2020 1 unit, monitor H&H, PPI, GI on board and she underwent EGD on 07/17/2020, discussed the case with Dr. Nicki Guadalajara and Dr. Leonie Man neurology, okay for heparin drip which was started on 07/18/2020 along with twice daily PPI.  No further bleeding and switched to Eliquis on 07/19/2020.  H&H relatively stable.  4.  ESRD.  On MWF schedule.    Neurology following continue outpatient regimen post discharge.  5.  Dyslipidemia.  On statin.  6.  Essential hypertension.  Very well controlled.  Continue diltiazem and hydralazine as well as as needed metoprolol.   7.  Mild metabolic encephalopathy.  She is completely alert and oriented at this point in time.  8.  COVID-19 infection diagnosed on 07/07/2020.  S/p antibody infusion.  Monitor.    9.  History of right-sided breast cancer.  Outpatient follow-up with oncology doses 32-year-old problem.    10.  Hip pain.  Chronic.  CT scan stable.  Monitor with supportive care.  PT OT.    Stable.  11. DM type II.  Hemoglobin A1c 7.9 on 07/13/2020.  On glipizide at home which is on hold.  Currently blood sugar slightly elevated on SSI.  Will add 10 units of Lantus.    Lab Results  Component Value Date   HGBA1C 7.9 (H) 07/13/2020    CBG (last 3)  Recent Labs    07/22/20 2345 07/23/20 0357 07/23/20 0716  GLUCAP 199* 188* 183*    Lab Results  Component Value Date   CHOL 99 07/14/2020   HDL 31 (L) 07/14/2020   LDLCALC 32 07/14/2020   TRIG 178 (H) 07/14/2020   CHOLHDL 3.2 07/14/2020    Lab Results   Component Value Date   HGBA1C 7.9 (H) 07/13/2020      Lab Results  Component Value Date   TSH 1.219 07/16/2020        Condition -stable  Family Communication  :   Code Status :  Full  Consults  : Neurology, PCCM, nephrology, cardiology  Procedures  :    EGD 12/23 - gastritis and duodenitis.  CT HEAD  07/12/2020  -   Acute 7 mm hemorrhage within the right basal ganglia. No substantial mass effect. Chronic microvascular ischemic disease and remote right cerebellar lacunar infarct.   CT HEAD WO CONTRAST - 07/13/2020 expected evolutionary changes in the acute right basal ganglia hemorrhage overall similar in size with slightly decreased density compared to previous CT.  No new abnormality.  MRI / MRA Head - unchanged 1 cm right posterior lentiform nucleus/internal capsule hemorrhage without significant surrounding edema or mass-effect.  4 mm diffusion abnormality involving subcortical white matter in the right parietal corona radiata consistent with acute small vessel type infarct.    MRA of the brain technically limited due to motion artifact but no large vessel occlusion.  Focal high-grade stenosis of the right distal M1 segment and distal left M1 segment bifurcation.  Severe multifocal bilateral P2 and distal left P3 stenosis.  Transthoracic Echocardiogram - left ventricular ejection fraction 60 to 65%.  00/00/2021  Bilateral Carotid Dopplers - bilateral 1-39% stenosis of carotids.    PUD Prophylaxis : PPI  Disposition Plan  :    Status is: Inpatient  Remains inpatient appropriate because:IV treatments appropriate due to intensity of illness or inability to take PO   Dispo: The patient is from: Home  Anticipated d/c is to: SNF              Anticipated d/c date is: > 3 days              Patient currently is medically stable to d/c.   DVT Prophylaxis  :   SCDs >> Heparin   Lab Results  Component Value Date   PLT 216 07/23/2020    Diet :   Diet Order            DIET SOFT Room service appropriate? Yes; Fluid consistency: Thin  Diet effective now                  Inpatient Medications  Scheduled Meds: . amiodarone  200 mg Oral Daily  . apixaban  2.5 mg Oral BID  . aspirin EC  81 mg Oral Daily  . atorvastatin  20 mg Oral QHS  . chlorhexidine  15 mL Mouth Rinse BID  . Chlorhexidine Gluconate Cloth  6 each Topical Daily  . diltiazem  120 mg Oral Daily  . hydrALAZINE  25 mg Oral Q8H  . insulin aspart  0-6 Units Subcutaneous Q4H  . mouth rinse  15 mL Mouth Rinse q12n4p  . midodrine  10 mg Oral Once  . pantoprazole  40 mg Oral BID   Continuous Infusions: . sodium chloride Stopped (07/17/20 1515)   PRN Meds:.sodium chloride, docusate sodium, guaiFENesin-dextromethorphan, heparin sodium (porcine), metoprolol tartrate, ondansetron (ZOFRAN) IV, polyethylene glycol, sodium chloride flush  Antibiotics  :    Anti-infectives (From admission, onward)   None       Time Spent in minutes 35 minutes   Darliss Cheney M.D on 07/23/2020 at 11:49 AM  To page go to [email or web address]   [facility] -  Office  [phone]   See all Orders from today for further details    Objective:   Vitals:   07/23/20 1030 07/23/20 1100 07/23/20 1115 07/23/20 1116  BP: (!) 86/46 (!) 91/56 (!) 117/55 136/78  Pulse: 77 74 68 88  Resp:    17  Temp:    98.4 F (36.9 C)  TempSrc:    Oral  SpO2:    97%  Weight:    74.6 kg  Height:        Wt Readings from Last 3 Encounters:  07/23/20 74.6 kg  04/21/20 77.9 kg  04/20/19 90.2 kg     Intake/Output Summary (Last 24 hours) at 07/23/2020 1149 Last data filed at 07/23/2020 1116 Gross per 24 hour  Intake 250 ml  Output 1017 ml  Net -767 ml     Physical Exam  General exam: Appears calm and comfortable  Respiratory system: Clear to auscultation. Respiratory effort normal. Cardiovascular system: S1 & S2 heard, RRR. No JVD, murmurs, rubs, gallops or clicks. No pedal  edema. Gastrointestinal system: Abdomen is nondistended, soft and nontender. No organomegaly or masses felt. Normal bowel sounds heard. Central nervous system: Alert and oriented.  Left upper extremity weakness Extremities: Symmetric 5 x 5 power. Skin: No rashes, lesions or ulcers.  Psychiatry: Judgement and insight appear normal. Mood & affect appropriate.      Data Review:    CBC Recent Labs  Lab 07/18/20 0050 07/19/20 0200 07/20/20 0524 07/21/20 0503 07/22/20 0240 07/23/20 0658  WBC 5.1 5.1 5.0 5.0 5.5 6.5  HGB 12.0 11.5* 11.0* 10.3* 10.1* 10.3*  HCT 37.7 36.3 34.7* 33.4* 32.2* 32.5*  PLT 130* 157 176 194 181 216  MCV 90.8 90.8 90.6  91.5 91.2 90.5  MCH 28.9 28.8 28.7 28.2 28.6 28.7  MCHC 31.8 31.7 31.7 30.8 31.4 31.7  RDW 17.2* 17.2* 17.0* 17.0* 16.8* 16.8*  LYMPHSABS 0.7 0.9 0.9 0.9 0.9  --   MONOABS 1.1* 1.1* 0.9 0.9 0.8  --   EOSABS 0.0 0.1 0.1 0.1 0.1  --   BASOSABS 0.0 0.0 0.0 0.0 0.0  --     Recent Labs  Lab 07/17/20 0152 07/18/20 0050 07/19/20 0200 07/21/20 0503 07/23/20 0658  NA 138 137 135 133* 132*  K 3.6 3.9 4.0 4.5 4.4  CL 98 98 96* 97* 95*  CO2 23 25 23 24 23   GLUCOSE 221* 140* 133* 158* 182*  BUN 37* 17 34* 27* 43*  CREATININE 5.93* 4.37* 6.30* 5.38* 5.99*  CALCIUM 8.3* 8.6* 8.7* 8.6* 8.9  AST 45* 49* 45*  --   --   ALT 23 28 31   --   --   ALKPHOS 72 79 80  --   --   BILITOT 0.7 0.9 0.9  --   --   ALBUMIN 2.8* 2.8* 2.7*  --  3.0*  MG 2.1 1.8 1.8  --   --   CRP 12.1* 8.9* 8.4*  --   --   DDIMER 1.86* 2.21* 1.47*  --   --   BNP 226.6* 179.8* 113.8*  --   --     ------------------------------------------------------------------------------------------------------------------ No results for input(s): CHOL, HDL, LDLCALC, TRIG, CHOLHDL, LDLDIRECT in the last 72 hours.  Lab Results  Component Value Date   HGBA1C 7.9 (H) 07/13/2020    ------------------------------------------------------------------------------------------------------------------ No results for input(s): TSH, T4TOTAL, T3FREE, THYROIDAB in the last 72 hours.  Invalid input(s): FREET3  Cardiac Enzymes No results for input(s): CKMB, TROPONINI, MYOGLOBIN in the last 168 hours.  Invalid input(s): CK ------------------------------------------------------------------------------------------------------------------    Component Value Date/Time   BNP 113.8 (H) 07/19/2020 0200    Micro Results Recent Results (from the past 240 hour(s))  SARS CORONAVIRUS 2 (TAT 6-24 HRS) Nasopharyngeal Nasopharyngeal Swab     Status: Abnormal   Collection Time: 07/21/20  1:34 PM   Specimen: Nasopharyngeal Swab  Result Value Ref Range Status   SARS Coronavirus 2 POSITIVE (A) NEGATIVE Final    Comment: RESULT CALLED TO, READ BACK BY AND VERIFIED WITH: F.AFOR,RN @2142  07/21/2020 VANG.J (NOTE) SARS-CoV-2 target nucleic acids are DETECTED.  The SARS-CoV-2 RNA is generally detectable in upper and lower respiratory specimens during the acute phase of infection. Positive results are indicative of the presence of SARS-CoV-2 RNA. Clinical correlation with patient history and other diagnostic information is  necessary to determine patient infection status. Positive results do not rule out bacterial infection or co-infection with other viruses.  The expected result is Negative.  Fact Sheet for Patients: SugarRoll.be  Fact Sheet for Healthcare Providers: https://www.woods-mathews.com/  This test is not yet approved or cleared by the [place] FDA and  has been authorized for detection and/or diagnosis of SARS-CoV-2 by FDA under an Emergency Use Authorization (EUA). This EUA will remain  in effect (meaning this test can be used) f or the duration of the COVID-19 declaration under Section 564(b)(1) of the Act, 21 U.S.C. section  360bbb-3(b)(1), unless the authorization is terminated or revoked sooner.   Performed at [facility], [place] [address] [place], [place] [address]     Radiology Reports DG Chest 1 View  Result Date: 07/12/2020 CLINICAL DATA:  Patient with history of COVID-19. History of GI bleed. EXAM: CHEST  1 VIEW  COMPARISON:  Chest radiograph 07/10/2020. FINDINGS: Stable right-sided dialysis catheter. Monitoring leads overlie the patient. Stable cardiomegaly. Aortic atherosclerosis. Site oval increased patchy opacities left mid lower lung. No definite pleural effusion or pneumothorax. Thoracic spine degenerative changes. Surgical clips right axilla. IMPRESSION: Increasing patchy opacities left mid and lower lung may represent infection in the appropriate clinical setting or potentially atelectasis. Electronically Signed   By: Lovey Newcomer M.D.   On: 07/12/2020 11:24   CT HEAD WO CONTRAST  Result Date: 07/16/2020 CLINICAL DATA:  Stroke follow-up. EXAM: CT HEAD WITHOUT CONTRAST TECHNIQUE: Contiguous axial images were obtained from the base of the skull through the vertex without intravenous contrast. COMPARISON:  07/13/2020 head CT and MRI FINDINGS: Brain: Approximately 1 cm hyperdense focus at the posterior aspect of the right lentiform nucleus corresponding to the previously demonstrated hemorrhage is unchanged in size. There is no associated edema. No new intracranial hemorrhage, acute large territory infarct, mass, midline shift, or extra-axial fluid collection is identified. Mild cerebral atrophy is within normal limits for age. Hypodensities in the cerebral white matter bilaterally are unchanged and nonspecific but compatible with mild to moderate chronic small vessel ischemic disease. Vascular: Calcified atherosclerosis at the skull base. Skull: No fracture or suspicious osseous lesion. Sinuses/Orbits: Moderate mucosal thickening in the frontal sinuses. Small right mastoid effusion. Bilateral cataract  extraction. Other: None. IMPRESSION: 1. Unchanged 1 cm right basal ganglia hemorrhage. 2. No new intracranial abnormality. Electronically Signed   By: Logan Bores M.D.   On: 07/16/2020 15:15   CT HEAD WO CONTRAST  Result Date: 07/13/2020 CLINICAL DATA:  Follow-up examination of basal ganglia hemorrhage. EXAM: CT HEAD WITHOUT CONTRAST TECHNIQUE: Contiguous axial images were obtained from the base of the skull through the vertex without intravenous contrast. COMPARISON:  Prior CT from 07/12/2020 FINDINGS: Brain: Previously identified intraparenchymal hemorrhage at the posterior right lentiform nucleus again seen. Bleed has mildly dispersed and is less dense in appearance as compared to previous exam, measuring overall similar in size at 10 x 7 x 10 mm, previously 10 x 5 x 10 mm when measured in a similar fashion. No significant surrounding edema or regional mass effect. No other acute or interval intracranial hemorrhage. No other acute large vessel territory infarct. No mass lesion, mass effect, or midline shift. No hydrocephalus or extra-axial fluid collection. Atrophy with chronic microvascular ischemic disease again noted. Vascular: No hyperdense vessel. Calcified atherosclerosis at the skull base. Skull: Scalp soft tissues and calvarium within normal limits. Sinuses/Orbits: Globes and orbital soft tissues demonstrate no acute finding. Mild mucoperiosteal thickening noted throughout the visualized paranasal sinuses. Mastoid air cells remain clear. Other: None. IMPRESSION: 1. Normal expected interval evolution of acute intraparenchymal hemorrhage positioned at the posterior right lentiform nucleus, overall similar in size but with slightly decreased density as compared to previous. No significant surrounding edema or regional mass effect. 2. No other new acute intracranial abnormality. 3. Atrophy with chronic microvascular ischemic disease. Electronically Signed   By: Jeannine Boga M.D.   On: 07/13/2020  18:45   CT PELVIS WO CONTRAST  Result Date: 07/14/2020 CLINICAL DATA:  Right hip pain EXAM: CT PELVIS WITHOUT CONTRAST TECHNIQUE: Multidetector CT imaging of the pelvis was performed following the standard protocol without intravenous contrast. COMPARISON:  Radiographs 07/12/2020 FINDINGS: Urinary Tract: Bladder and distal ureters obscured by streak artifact from the patient's bilateral hip hardware. Bowel:  Unremarkable, rectum partially obscured by streak artifact. Vascular/Lymphatic: Aortoiliac atherosclerotic vascular disease. No pathologic adenopathy identified. Reproductive:  Unremarkable Other:  No  supplemental non-categorized findings. Musculoskeletal: Right hip screw with IM nail. Single distal interlocking screw along the IM nail. Inter trochanteric deformity from old fracture, no acute fracture is identified. Severe degenerative right hip arthropathy with loss of articular space and spurring. Left hip hemiarthroplasty without discrete complicating feature identified. Mild spurring along the SI joints. There is lower lumbar spondylosis and degenerative disc disease leading to moderate left foraminal stenosis at L5-S1. IMPRESSION: 1. Severe degenerative right hip arthropathy. 2. Right hip screw with IM nail. Right intertrochanteric deformity from old fracture. No acute fracture is identified. 3. Left hip hemiarthroplasty without discrete complicating feature identified. 4. Lower lumbar spondylosis and degenerative disc disease leading to moderate left foraminal stenosis at L5-S1. 5. Aortic atherosclerosis. Aortic Atherosclerosis (ICD10-I70.0). Electronically Signed   By: Van Clines M.D.   On: 07/14/2020 14:12   MR ANGIO HEAD WO CONTRAST  Result Date: 07/13/2020 CLINICAL DATA:  Follow-up examination for acute stroke. EXAM: MRI HEAD WITHOUT CONTRAST MRA HEAD WITHOUT CONTRAST TECHNIQUE: Multiplanar, multiecho pulse sequences of the brain and surrounding structures were obtained without  intravenous contrast. Angiographic images of the head were obtained using MRA technique without contrast. COMPARISON:  Prior head CT from 07/12/2020 as well as previous brain MRI from 08/17/2016. FINDINGS: MRI HEAD FINDINGS Brain: Diffuse prominence of the CSF containing spaces compatible with generalized age-related cerebral atrophy. Patchy and confluent T2/FLAIR hyperintensity within the periventricular and deep white matter both cerebral hemispheres most likely related chronic microvascular ischemic disease. Patchy involvement of the pons. Overall, these changes are mildly progressed as compared to 2018. Few small remote lacunar type infarcts noted about the basal ganglia, most notable which present at the right caudothalamic groove. Previously identified hemorrhage centered at the posterior right lentiform nucleus/right internal capsule again seen, stable in size measuring up to approximately 1 cm in greatest dimension. This is somewhat difficult to visualized by MR, and is most notable on T1 weighted sequence (series 16, image 26). No significant surrounding edema or regional mass effect. No other acute intracranial hemorrhage. Few scattered chronic micro hemorrhages noted at the right cerebellum and pons, likely small vessel/hypertensive in nature. Single punctate 4 mm focus of diffusion abnormality involving the subcortical white matter of the right parietal corona radiata noted, likely a small acute to subacute small vessel type ischemic infarct (series 5, image 74). No associated hemorrhage or mass effect. No other diffusion abnormality to suggest acute or subacute ischemia. Gray-white matter differentiation otherwise maintained. No mass lesion, mass effect, or midline shift. No hydrocephalus or extra-axial fluid collection. Pituitary gland suprasellar region normal. Midline structures intact. 1.3 cm ovoid well-circumscribed lesion positioned at the left petrous apex demonstrating heterogeneous T2/FLAIR  signal intensity with hyperintense T1 signal intensity noted (series 16, image 14), nonspecific, but could reflect a small cholesterol granuloma versus asymmetric fatty marrow. Finding is relatively stable from previous MRI from 2018, and of doubtful significance. Vascular: Major intracranial vascular flow voids are maintained. Skull and upper cervical spine: Craniocervical junction within normal limits. Multilevel degenerative spondylosis noted within the visualized upper cervical spine with resultant mild-to-moderate spinal stenosis. Bone marrow signal intensity within normal limits. No scalp soft tissue abnormality. Sinuses/Orbits: Patient status post bilateral ocular lens replacement. Globes and orbital soft tissues demonstrate no acute finding. Mild scattered mucosal thickening noted throughout the paranasal sinuses. Trace bilateral mastoid effusions, of doubtful significance. Visualized nasopharynx within normal limits. Other: None. MRA HEAD FINDINGS ANTERIOR CIRCULATION: Examination degraded by motion artifact. Visualized distal cervical segments of the internal carotid arteries are  patent with symmetric antegrade flow. Petrous segments widely patent. Atheromatous irregularity throughout the carotid siphons with associated mild multifocal narrowing. A1 segments patent bilaterally. Normal anterior communicating artery complex. Anterior cerebral arteries patent to their distal aspects without high-grade stenosis. Short-segment severe stenosis at the distal left M1 segment/left MCA bifurcation (series 1036, image 12). Left MCA branches are diffusely irregular but perfused distally. Right M1 segment patent proximally. Focal signal cutoff at the mid-distal right M1 segment into the bifurcation (series 1036, image 11). This likely reflects a severe high-grade stenosis, as the right MCA branches are perfused distally. Diffuse atheromatous irregularity seen throughout the right MCA branches as well. Proximally.  POSTERIOR CIRCULATION: Vertebral arteries largely code dominant and patent to the vertebrobasilar junction. Both PICA origins patent and normal. Basilar patent to its distal aspect without stenosis. Superior cerebral arteries patent bilaterally. Both PCAs primarily supplied via the basilar. Severe long segment stenosis involving the mid-distal right P2 segment (series 1046, image 15). Severe multifocal distal left P2 and P3 stenoses present as well (series 1046, image 14). PCAs remain perfused to their distal aspects. No intracranial aneurysm or other vascular abnormality. IMPRESSION: MRI HEAD IMPRESSION: 1. Unchanged 1 cm hemorrhage centered at the posterior right lentiform nucleus/right internal capsule. No significant surrounding edema or regional mass effect. 2. 4 mm focus of diffusion abnormality involving the subcortical white matter of the right parietal corona radiata, consistent with a small acute to subacute small vessel type ischemic infarct. No associated hemorrhage. 3. Underlying age-related cerebral atrophy with moderate chronic microvascular ischemic disease, mildly progressed as compared to 2018. MRA HEAD IMPRESSION: 1. Technically limited exam due to motion artifact. 2. Negative intracranial MRA for large vessel occlusion. 3. Focal severe high-grade stenoses involving the mid-distal right M1 segment and distal left M1 segment/left MCA bifurcation. Left MCA branches remain perfused distally. 4. Severe multifocal bilateral P2 and distal left P3 stenoses as above. Electronically Signed   By: Jeannine Boga M.D.   On: 07/13/2020 19:16   MR BRAIN WO CONTRAST  Result Date: 07/13/2020 CLINICAL DATA:  Follow-up examination for acute stroke. EXAM: MRI HEAD WITHOUT CONTRAST MRA HEAD WITHOUT CONTRAST TECHNIQUE: Multiplanar, multiecho pulse sequences of the brain and surrounding structures were obtained without intravenous contrast. Angiographic images of the head were obtained using MRA technique  without contrast. COMPARISON:  Prior head CT from 07/12/2020 as well as previous brain MRI from 08/17/2016. FINDINGS: MRI HEAD FINDINGS Brain: Diffuse prominence of the CSF containing spaces compatible with generalized age-related cerebral atrophy. Patchy and confluent T2/FLAIR hyperintensity within the periventricular and deep white matter both cerebral hemispheres most likely related chronic microvascular ischemic disease. Patchy involvement of the pons. Overall, these changes are mildly progressed as compared to 2018. Few small remote lacunar type infarcts noted about the basal ganglia, most notable which present at the right caudothalamic groove. Previously identified hemorrhage centered at the posterior right lentiform nucleus/right internal capsule again seen, stable in size measuring up to approximately 1 cm in greatest dimension. This is somewhat difficult to visualized by MR, and is most notable on T1 weighted sequence (series 16, image 26). No significant surrounding edema or regional mass effect. No other acute intracranial hemorrhage. Few scattered chronic micro hemorrhages noted at the right cerebellum and pons, likely small vessel/hypertensive in nature. Single punctate 4 mm focus of diffusion abnormality involving the subcortical white matter of the right parietal corona radiata noted, likely a small acute to subacute small vessel type ischemic infarct (series 5, image 74). No associated hemorrhage  or mass effect. No other diffusion abnormality to suggest acute or subacute ischemia. Gray-white matter differentiation otherwise maintained. No mass lesion, mass effect, or midline shift. No hydrocephalus or extra-axial fluid collection. Pituitary gland suprasellar region normal. Midline structures intact. 1.3 cm ovoid well-circumscribed lesion positioned at the left petrous apex demonstrating heterogeneous T2/FLAIR signal intensity with hyperintense T1 signal intensity noted (series 16, image 14),  nonspecific, but could reflect a small cholesterol granuloma versus asymmetric fatty marrow. Finding is relatively stable from previous MRI from 2018, and of doubtful significance. Vascular: Major intracranial vascular flow voids are maintained. Skull and upper cervical spine: Craniocervical junction within normal limits. Multilevel degenerative spondylosis noted within the visualized upper cervical spine with resultant mild-to-moderate spinal stenosis. Bone marrow signal intensity within normal limits. No scalp soft tissue abnormality. Sinuses/Orbits: Patient status post bilateral ocular lens replacement. Globes and orbital soft tissues demonstrate no acute finding. Mild scattered mucosal thickening noted throughout the paranasal sinuses. Trace bilateral mastoid effusions, of doubtful significance. Visualized nasopharynx within normal limits. Other: None. MRA HEAD FINDINGS ANTERIOR CIRCULATION: Examination degraded by motion artifact. Visualized distal cervical segments of the internal carotid arteries are patent with symmetric antegrade flow. Petrous segments widely patent. Atheromatous irregularity throughout the carotid siphons with associated mild multifocal narrowing. A1 segments patent bilaterally. Normal anterior communicating artery complex. Anterior cerebral arteries patent to their distal aspects without high-grade stenosis. Short-segment severe stenosis at the distal left M1 segment/left MCA bifurcation (series 1036, image 12). Left MCA branches are diffusely irregular but perfused distally. Right M1 segment patent proximally. Focal signal cutoff at the mid-distal right M1 segment into the bifurcation (series 1036, image 11). This likely reflects a severe high-grade stenosis, as the right MCA branches are perfused distally. Diffuse atheromatous irregularity seen throughout the right MCA branches as well. Proximally. POSTERIOR CIRCULATION: Vertebral arteries largely code dominant and patent to the  vertebrobasilar junction. Both PICA origins patent and normal. Basilar patent to its distal aspect without stenosis. Superior cerebral arteries patent bilaterally. Both PCAs primarily supplied via the basilar. Severe long segment stenosis involving the mid-distal right P2 segment (series 1046, image 15). Severe multifocal distal left P2 and P3 stenoses present as well (series 1046, image 14). PCAs remain perfused to their distal aspects. No intracranial aneurysm or other vascular abnormality. IMPRESSION: MRI HEAD IMPRESSION: 1. Unchanged 1 cm hemorrhage centered at the posterior right lentiform nucleus/right internal capsule. No significant surrounding edema or regional mass effect. 2. 4 mm focus of diffusion abnormality involving the subcortical white matter of the right parietal corona radiata, consistent with a small acute to subacute small vessel type ischemic infarct. No associated hemorrhage. 3. Underlying age-related cerebral atrophy with moderate chronic microvascular ischemic disease, mildly progressed as compared to 2018. MRA HEAD IMPRESSION: 1. Technically limited exam due to motion artifact. 2. Negative intracranial MRA for large vessel occlusion. 3. Focal severe high-grade stenoses involving the mid-distal right M1 segment and distal left M1 segment/left MCA bifurcation. Left MCA branches remain perfused distally. 4. Severe multifocal bilateral P2 and distal left P3 stenoses as above. Electronically Signed   By: Jeannine Boga M.D.   On: 07/13/2020 19:16   DG Pelvis Portable  Result Date: 07/12/2020 CLINICAL DATA:  Right hip pain.  No reported injury. EXAM: PORTABLE PELVIS 1-2 VIEWS COMPARISON:  07/21/2018 outside pelvic radiographs FINDINGS: Partially visualized left hip hemiarthroplasty and partially visualized fixation hardware in the proximal right femur with no evidence of hardware fracture or loosening. No evidence of hip dislocation on this single frontal  view. No osseous fracture. No  focal osseous lesions. Moderate right hip osteoarthritis. No suspicious focal osseous lesions. No pelvic diastasis. IMPRESSION: No acute osseous abnormality. No evidence of hip dislocation on this single frontal view. Moderate right hip osteoarthritis. Partially visualized left hip hemiarthroplasty and proximal right femur fixation hardware, with no evidence of hardware complication. Electronically Signed   By: Ilona Sorrel M.D.   On: 07/12/2020 12:33   DG CHEST PORT 1 VIEW  Result Date: 07/15/2020 CLINICAL DATA:  COVID-19. EXAM: PORTABLE CHEST 1 VIEW COMPARISON:  07/12/2020. FINDINGS: Right dual-lumen catheter stable position. Stable cardiomegaly. Progressive bilateral pulmonary infiltrates/edema. Tiny bilateral pleural effusions cannot be excluded. No pneumothorax. Degenerative change thoracic spine. Surgical clips right chest. IMPRESSION: 1. Right dual-lumen catheter in stable position. 2. Stable cardiomegaly. 3. Progressive bilateral pulmonary infiltrates/edema. Tiny bilateral pleural effusions cannot be excluded. Electronically Signed   By: Marcello Moores  Register   On: 07/15/2020 05:24   DG Abd Portable 1V  Result Date: 07/13/2020 CLINICAL DATA:  Evaluate orogastric tube EXAM: PORTABLE ABDOMEN - 1 VIEW COMPARISON:  July 13, 2020 FINDINGS: The OG tube side port and distal tip are in the region the stomach. IMPRESSION: The OG tube is in good position. Electronically Signed   By: Dorise Bullion III M.D   On: 07/13/2020 11:04   DG Abd Portable 1V  Result Date: 07/13/2020 CLINICAL DATA:  Vomiting. EXAM: PORTABLE ABDOMEN - 1 VIEW COMPARISON:  None. FINDINGS: No evidence of dilated bowel loops. Aortic atherosclerotic calcification noted. Compression screw noted in the right hip. Left hip prosthesis is also seen. IMPRESSION: Unremarkable bowel gas pattern.  No acute findings. Electronically Signed   By: Marlaine Hind M.D.   On: 07/13/2020 05:08   ECHOCARDIOGRAM COMPLETE  Result Date: 07/14/2020     ECHOCARDIOGRAM REPORT   Patient Name:   JESSALYNN MCCOWAN Date of Exam: 07/14/2020 Medical Rec #:  [id number]     Height:       64.0 in Accession #:    [id number]    Weight:       174.6 lb Date of Birth:  1940/08/17      BSA:          1.847 m Patient Age:    8 years      BP:           114/75 mmHg Patient Gender: F             HR:           77 bpm. Exam Location:  Inpatient Procedure: 2D Echo, Color Doppler and Cardiac Doppler Indications:    Stroke 434.91 / I163.9  History:        Patient has no prior history of Echocardiogram examinations.                 Risk Factors:Dyslipidemia and Diabetes.  Sonographer:    Bernadene Person RDCS Referring Phys: [place]  1. Left ventricular ejection fraction, by estimation, is 60 to 65%. The left ventricle has normal function. The left ventricle has no regional wall motion abnormalities. There is moderate left ventricular hypertrophy. Left ventricular diastolic parameters are consistent with Grade I diastolic dysfunction (impaired relaxation). Elevated left ventricular end-diastolic pressure. The E/e' is 28.  2. Right ventricular systolic function is normal. The right ventricular size is normal.  3. The pericardial effusion is posterior to the left ventricle.  4. The mitral valve is abnormal. Mild mitral valve regurgitation. Moderate mitral annular calcification.  5. The aortic valve is tricuspid. Aortic valve regurgitation is not visualized. Mild aortic valve sclerosis is present, with no evidence of aortic valve stenosis. Comparison(s): No prior Echocardiogram. FINDINGS  Left Ventricle: Left ventricular ejection fraction, by estimation, is 60 to 65%. The left ventricle has normal function. The left ventricle has no regional wall motion abnormalities. The left ventricular internal cavity size was normal in size. There is  moderate left ventricular hypertrophy. Left ventricular diastolic parameters are consistent with Grade I diastolic dysfunction (impaired  relaxation). Elevated left ventricular end-diastolic pressure. The E/e' is 20. Right Ventricle: The right ventricular size is normal. No increase in right ventricular wall thickness. Right ventricular systolic function is normal. Left Atrium: Left atrial size was normal in size. Right Atrium: Right atrial size was normal in size. Pericardium: Trivial pericardial effusion is present. The pericardial effusion is posterior to the left ventricle. Mitral Valve: The mitral valve is abnormal. There is mild thickening of the mitral valve leaflet(s). Moderate mitral annular calcification. Mild mitral valve regurgitation, with posteriorly-directed jet. Tricuspid Valve: The tricuspid valve is grossly normal. Tricuspid valve regurgitation is trivial. Aortic Valve: The aortic valve is tricuspid. Aortic valve regurgitation is not visualized. Mild aortic valve sclerosis is present, with no evidence of aortic valve stenosis. Pulmonic Valve: The pulmonic valve was normal in structure. Pulmonic valve regurgitation is trivial. Aorta: The aortic root and ascending aorta are structurally normal, with no evidence of dilitation. Venous: The inferior vena cava was not well visualized. IAS/Shunts: No atrial level shunt detected by color flow Doppler.  LEFT VENTRICLE PLAX 2D LVIDd:         4.30 cm  Diastology LVIDs:         2.80 cm  LV e' medial:    5.66 cm/s LV PW:         1.20 cm  LV E/e' medial:  21.9 LV IVS:        1.20 cm  LV e' lateral:   6.20 cm/s LVOT diam:     2.10 cm  LV E/e' lateral: 20.0 LV SV:         103 LV SV Index:   56 LVOT Area:     3.46 cm  RIGHT VENTRICLE RV S prime:     13.90 cm/s TAPSE (M-mode): 1.8 cm LEFT ATRIUM             Index       RIGHT ATRIUM           Index LA diam:        3.30 cm 1.79 cm/m  RA Area:     13.60 cm LA Vol (A2C):   51.0 ml 27.62 ml/m RA Volume:   28.50 ml  15.43 ml/m LA Vol (A4C):   50.1 ml 27.13 ml/m LA Biplane Vol: 51.9 ml 28.10 ml/m  AORTIC VALVE LVOT Vmax:   143.00 cm/s LVOT Vmean:   98.200 cm/s LVOT VTI:    0.296 m  AORTA Ao Root diam: 3.00 cm Ao Asc diam:  3.40 cm MITRAL VALVE                TRICUSPID VALVE MV Area (PHT): 3.21 cm     TR Peak grad:   23.2 mmHg MV Decel Time: 236 msec     TR Vmax:        241.00 cm/s MV E velocity: 124.00 cm/s MV A velocity: 120.00 cm/s  SHUNTS MV E/A ratio:  1.03  Systemic VTI:  0.30 m                             Systemic Diam: 2.10 cm Lyman Bishop MD Electronically signed by Lyman Bishop MD Signature Date/Time: 07/14/2020/2:10:04 PM    Final    CT HEAD CODE STROKE WO CONTRAST  Addendum Date: 07/12/2020   ADDENDUM REPORT: 07/12/2020 10:16 ADDENDUM: Correction.  Findings were discussed with Dr. Theda Sers. Electronically Signed   By: Margaretha Sheffield MD   On: 07/12/2020 10:16   Result Date: 07/12/2020 CLINICAL DATA:  Code stroke.  Neuro deficit, acute stroke suspected. EXAM: CT HEAD WITHOUT CONTRAST TECHNIQUE: Contiguous axial images were obtained from the base of the skull through the vertex without intravenous contrast. COMPARISON:  MRI head August 17, 2016 FINDINGS: Brain: Acute hemorrhage in the right basal ganglia, centered in the region of the posterior putamen/posterior limb of the internal capsule. The hemorrhage measures approximately 7 x 4 by 6 mm. No evidence of acute large vascular territory infarct. Patchy white matter hypoattenuation, which is nonspecific but most likely related to chronic microvascular ischemic disease. Mild generalized atrophy. No hydrocephalus. No evidence of a mass lesion. No midline shift. Remote right cerebellar lacunar infarct. Vascular: Calcific atherosclerosis. Skull: No acute fracture. Sinuses/Orbits: Sinuses are clear.  Unremarkable orbits. Other: No mastoid effusion. IMPRESSION: 1. Acute 7 mm hemorrhage within the right basal ganglia. No substantial mass effect. 2. Chronic microvascular ischemic disease and remote right cerebellar lacunar infarct. Code stroke imaging results were communicated on  07/12/2020 at 10:00 am to provider Dr. Lorrin Goodell via telephone, who verbally acknowledged these results. Electronically Signed: By: Margaretha Sheffield MD On: 07/12/2020 10:05   VAS US CAROTID  Result Date: 07/14/2020 Carotid Arterial Duplex Study Indications:       CVA. Risk Factors:      Hyperlipidemia, Diabetes. Limitations        Today's exam was limited due to the patient's inability or                    unwillingness to cooperate. Comparison Study:  no prior Performing Technologist: Abram Sander RVS  Examination Guidelines: A complete evaluation includes B-mode imaging, spectral Doppler, color Doppler, and power Doppler as needed of all accessible portions of each vessel. Bilateral testing is considered an integral part of a complete examination. Limited examinations for reoccurring indications may be performed as noted.  Right Carotid Findings: +----------+--------+--------+--------+------------------+--------------+           PSV cm/sEDV cm/sStenosisPlaque DescriptionComments       +----------+--------+--------+--------+------------------+--------------+ CCA Prox  54      9               heterogenous                     +----------+--------+--------+--------+------------------+--------------+ CCA Distal65      16              heterogenous                     +----------+--------+--------+--------+------------------+--------------+ ICA Prox  48      17      1-39%   heterogenous                     +----------+--------+--------+--------+------------------+--------------+ ICA Distal  Not visualized +----------+--------+--------+--------+------------------+--------------+ ECA       91      9                                                +----------+--------+--------+--------+------------------+--------------+ +----------+--------+-------+--------------------------+-------------------+           PSV cm/sEDV cmsDescribe                   Arm Pressure (mmHG) +----------+--------+-------+--------------------------+-------------------+ Subclavian               not visualized due to port                    +----------+--------+-------+--------------------------+-------------------+ +---------+--------+--------+--------------+ VertebralPSV cm/sEDV cm/sNot identified +---------+--------+--------+--------------+  Left Carotid Findings: +----------+--------+--------+--------+------------------+--------------+           PSV cm/sEDV cm/sStenosisPlaque DescriptionComments       +----------+--------+--------+--------+------------------+--------------+ CCA Prox  53      5               heterogenous                     +----------+--------+--------+--------+------------------+--------------+ CCA Distal73      10              heterogenous                     +----------+--------+--------+--------+------------------+--------------+ ICA Prox  127     16      1-39%   heterogenous                     +----------+--------+--------+--------+------------------+--------------+ ICA Distal                                          Not visualized +----------+--------+--------+--------+------------------+--------------+ ECA       220                                                      +----------+--------+--------+--------+------------------+--------------+ +----------+--------+--------+--------------+-------------------+           PSV cm/sEDV cm/sDescribe      Arm Pressure (mmHG) +----------+--------+--------+--------------+-------------------+ Subclavian                Not identified                    +----------+--------+--------+--------------+-------------------+ +---------+--------+--------+--------------+ VertebralPSV cm/sEDV cm/sNot identified +---------+--------+--------+--------------+   Summary: Right Carotid: Velocities in the right ICA are consistent with a 1-39% stenosis. Left  Carotid: Velocities in the left ICA are consistent with a 1-39% stenosis. Vertebrals: Bilateral vertebral arteries were not visualized. *See table(s) above for measurements and observations.  Electronically signed by Antony Contras MD on 07/14/2020 at 1:04:27 PM.    Final

## 2020-07-23 NOTE — Progress Notes (Signed)
  Speech Language Pathology Treatment: Cognitive-Linquistic  Patient Details Name: [name] MRN: [id number] DOB: 30-Apr-1941 Today's Date: 07/23/2020 Time: 8563-1497 SLP Time Calculation (min) (ACUTE ONLY): 18 min  Assessment / Plan / Recommendation Clinical Impression  Pt was seen for cognitive-linguistic treatment. She was alert and cooperative. Pt stated that she feels less "cloudy" than during the initial evaluation, but that she does not believe she is back to baseline. Pt was educated regarding the results of the initial evaluation, her deficits, and recommendations. However, despite education, she stated that she still did not fully understand the purpose of treatment. Additional education was provided with verbalized improvement in understanding. She demonstrated 75% accuracy with time management problems increasing to 100% with cues. She demonstrated adequate focused attention, but required intermittent cues for sustained attention. She was oriented x4 and awareness was improved. Pt expressed that she was tired from dialysis earlier and it was agreed that the session be abbreviated. SLP will continue to follow pt.    HPI HPI: Pt is a 79 y.o. female with initial COVID dx on 07/07/20 and underwent antibody infusion, admitted 07/12/20 as code stroke with L-side facial droop and flaccid paralysis. Workup revealed 7 mm hemorrhage in R basal ganglia; not a tPA or IR candidate. Pt also with new onset PAF, GIB and GI was consulted. Plan for EGD 12/23 with notes stating that she may have regular consistencies on 12/22. PMH includes CA, DM, ESRD (HD MWF).      SLP Plan  Continue with current plan of care       Recommendations                   Follow up Recommendations: Inpatient Rehab SLP Visit Diagnosis: Cognitive communication deficit (W26.378) Plan: Continue with current plan of care       Kourtlyn Charlet I. [name], [place], [place] Office number  [phone] Pager [place] 07/23/2020, 5:38 PM

## 2020-07-23 NOTE — Progress Notes (Signed)
OT Cancellation Note  Patient Details Name: [name] MRN: [id number] DOB: 1941-07-01   Cancelled Treatment:    Reason Eval/Treat Not Completed: Patient at procedure or test/ unavailable (Pt OTF at HD. Will follow up as available and appropriate)   Zenovia Jarred, MSOT, OTR/L [facility] Office Number: [phone] Pager: [phone]  Zenovia Jarred 07/23/2020, 8:24 AM

## 2020-07-23 NOTE — Plan of Care (Signed)
  Problem: Education: Goal: Knowledge of General Education information will improve Description Including pain rating scale, medication(s)/side effects and non-pharmacologic comfort measures Outcome: Progressing   

## 2020-07-23 NOTE — Progress Notes (Addendum)
Occupational Therapy Treatment Patient Details Name: [name] MRN: [id number] DOB: 23-Dec-1940 Today's Date: 07/23/2020    History of present illness Pt is a 79 y.o. female with initial COVID dx on 07/07/20 and underwent antibody infusion, admitted 07/12/20 as code stroke with L-side facial droop and flaccid paralysis. Workup revealed 7 mm hemorrhage in R basal ganglia; not a tPA or IR candidate. Pt also with new onset PAF, GIB. S/p EGD 12/23. PMH includes CA, DM, ESRD (HD MWF).   OT comments  Pt seen for OT follow up session with focus on ADL mobility progression and BUE [facility] exercise. Pt completed transfers with min A and RW. She was then able to mobilize to sink to completer x1 grooming task with min A. Pt required min-mod VC's for sequencing/problem solving of task. During grooming task, pt was challenged with scanning the sink to locate ADL items. Pt then was lead through BUE [facility] exercises (see exercise section below for details) and tolerated well. Noted she has difficulty with [facility] skills and motor planning. D/c recs remain appropriate, will continue to follow.    Follow Up Recommendations  SNF;Supervision/Assistance - 24 hour    Equipment Recommendations  3 in 1 bedside commode    Recommendations for Other Services      Precautions / Restrictions Precautions Precautions: Fall Restrictions Weight Bearing Restrictions: No       Mobility Bed Mobility               General bed mobility comments: pt sitting EOB on OT arrival without bed alarm on  Transfers Overall transfer level: Needs assistance Equipment used: Rolling walker (2 wheeled) Transfers: Sit to/from Stand Sit to Stand: Min assist         General transfer comment: Assist for balance. Verbal cues for hand placement    Balance Overall balance assessment: Needs assistance Sitting-balance support: No upper extremity supported;Feet supported Sitting balance-Leahy Scale: Fair     Standing balance  support: Bilateral upper extremity supported;During functional activity Standing balance-Leahy Scale: Poor Standing balance comment: UE support of RW with walking and UE support on sink with grooming tasks                           ADL either performed or assessed with clinical judgement   ADL Overall ADL's : Needs assistance/impaired     Grooming: Minimal assistance;Cueing for sequencing;Cueing for safety;Standing Grooming Details (indicate cue type and reason): pt mobilized to sink in room to complete oral care while standing. Pt required min A due to poor problem solving/sequencing. At first, pt put toothbrush in mouth with nothing on it, then said "no toothpaste"- cues were given and pt struggled to open toothpaste due to poor [facility]. Once pt began brushing teeth, she then required cues to rinse mouth properly                 Toilet Transfer: Min guard;Ambulation;RW Toilet Transfer Details (indicate cue type and reason): pt mobilized from sink to recliner to simulte toilet transfer         Functional mobility during ADLs: Min guard;Rolling walker;Cueing for safety;Cueing for sequencing General ADL Comments: requires cues for problem solving- specifically sequencing task appropriately. Session also focused on BUE [facility] exercise     Vision Baseline Vision/History: (P) Wears glasses Wears Glasses: (P) At all times     Perception     Praxis      Cognition Arousal/Alertness: Awake/alert Behavior During Therapy:  Flat affect Overall Cognitive Status: Impaired/Different from baseline Area of Impairment: Attention;Memory;Following commands;Safety/judgement;Awareness;Problem solving                   Current Attention Level: Selective Memory: Decreased short-term memory Following Commands: Follows multi-step commands consistently;Follows multi-step commands with increased time Safety/Judgement: Decreased awareness of safety;Decreased awareness of  deficits Awareness: Emergent Problem Solving: Slow processing;Requires verbal cues;Difficulty sequencing General Comments: continues to present with flat affect. Able to state "I understand I had a stroke from what they say". Noted difficulty sequencing ADL tasks this date        Exercises General Exercises - Upper Extremity Digit Composite Flexion: AROM;Strengthening;Both;10 reps;Other (comment) (wash cloth) Composite Extension: AROM;10 reps Hand Exercises Digit Lifts: AROM;Both;10 reps;Seated Opposition: AROM;Both;5 reps;Seated Hand Activities Open and Close Containers: Both;5 reps;Seated   Shoulder Instructions       General Comments      Pertinent Vitals/ Pain       Pain Assessment: No/denies pain Faces Pain Scale: No hurt  Home Living                                          Prior Functioning/Environment              Frequency  Min 3X/week        Progress Toward Goals  OT Goals(current goals can now be found in the care plan section)     Acute Rehab OT Goals Patient Stated Goal: to "get back to living" OT Goal Formulation: With patient Time For Goal Achievement: 07/30/20 Potential to Achieve Goals: Good  Plan Discharge plan remains appropriate    Co-evaluation                 AM-PAC OT "6 Clicks" Daily Activity     Outcome Measure   Help from another person eating meals?: A Little Help from another person taking care of personal grooming?: A Little Help from another person toileting, which includes using toliet, bedpan, or urinal?: A Lot Help from another person bathing (including washing, rinsing, drying)?: A Lot Help from another person to put on and taking off regular upper body clothing?: A Lot Help from another person to put on and taking off regular lower body clothing?: A Lot 6 Click Score: 14    End of Session Equipment Utilized During Treatment: Gait belt;Rolling walker  OT Visit Diagnosis: Unsteadiness on  feet (R26.81);Other abnormalities of gait and mobility (R26.89);Muscle weakness (generalized) (M62.81);Low vision, both eyes (H54.2);Other symptoms and signs involving cognitive function   Activity Tolerance Patient tolerated treatment well   Patient Left in chair;with call bell/phone within reach;with chair alarm set   Nurse Communication          Time: [phone] OT Time Calculation (min): 28 min  Charges: OT General Charges $OT Visit: 1 Visit OT Treatments $Self Care/Home Management : 8-22 mins $Therapeutic Activity: 8-22 mins  Zenovia Jarred, MSOT, OTR/L [place] [facility] Office Number: [phone] Pager: [phone]  Zenovia Jarred 07/23/2020, 5:51 PM

## 2020-07-23 NOTE — Progress Notes (Signed)
[facility] Progress Note   Subjective:  Observed on HD this morning through window - still being dialyzed on COVID isolation. No c/o noted.  Objective Vitals:   07/23/20 1100 07/23/20 1115 07/23/20 1116 07/23/20 1210  BP: (!) 91/56 (!) 117/55 136/78 (!) 146/77  Pulse: 74 68 88 75  Resp:   17 19  Temp:   98.4 F (36.9 C) 98.5 F (36.9 C)  TempSrc:   Oral Oral  SpO2:   97% 100%  Weight:   74.6 kg   Height:       Physical Exam General: Well appearing. Further exam deferred.   Additional Objective Labs: Basic Metabolic Panel: Recent Labs  Lab 07/19/20 0200 07/21/20 0503 07/23/20 0658  NA 135 133* 132*  K 4.0 4.5 4.4  CL 96* 97* 95*  CO2 23 24 23   GLUCOSE 133* 158* 182*  BUN 34* 27* 43*  CREATININE 6.30* 5.38* 5.99*  CALCIUM 8.7* 8.6* 8.9  PHOS  --   --  5.6*   Liver Function Tests: Recent Labs  Lab 07/17/20 0152 07/18/20 0050 07/19/20 0200 07/23/20 0658  AST 45* 49* 45*  --   ALT 23 28 31   --   ALKPHOS 72 79 80  --   BILITOT 0.7 0.9 0.9  --   PROT 6.4* 6.4* 6.6  --   ALBUMIN 2.8* 2.8* 2.7* 3.0*   CBC: Recent Labs  Lab 07/19/20 0200 07/20/20 0524 07/21/20 0503 07/22/20 0240 07/23/20 0658  WBC 5.1 5.0 5.0 5.5 6.5  NEUTROABS 2.9 3.0 2.9 3.6  --   HGB 11.5* 11.0* 10.3* 10.1* 10.3*  HCT 36.3 34.7* 33.4* 32.2* 32.5*  MCV 90.8 90.6 91.5 91.2 90.5  PLT 157 176 194 181 216   Medications: . sodium chloride Stopped (07/17/20 1515)   . amiodarone  200 mg Oral Daily  . apixaban  2.5 mg Oral BID  . aspirin EC  81 mg Oral Daily  . atorvastatin  20 mg Oral QHS  . chlorhexidine  15 mL Mouth Rinse BID  . Chlorhexidine Gluconate Cloth  6 each Topical Daily  . diltiazem  120 mg Oral Daily  . hydrALAZINE  25 mg Oral Q8H  . insulin aspart  0-6 Units Subcutaneous Q4H  . insulin glargine  20 Units Subcutaneous Daily  . mouth rinse  15 mL Mouth Rinse q12n4p  . midodrine  10 mg Oral Once  . pantoprazole  40 mg Oral BID    Dialysis  Orders: MWF [facility] 3.5h (running 3hrs on COVID shift this week) 400/800 79kg 3K/2.25 bath P2 Hep 3500 RIJ TDC/ no AVF - calcitriol 0.5 ug tiw  Assessment/Plan: 1.  COVID-19 infection: Dx 12/13, s/p monoclonal antibody treatment on 12/17.  She was unvaccinated. Improving clinically. Off isolation from [facility] standpoint, will remain on isolation during dialysis until 07/28/2020. 2.  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 Some residual right hand weakness noted. 3. ESRD/ volume: Continue HD per usual MWF schedule - HD today. 4.  GI bleed with acute blood loss anemia: Hgb 10.3.  S/p EGD 12/23 showing gastritis and duodenitis without any focal bleeding.  Ongoing medical management with PPI. 5. CKD-MBD: Ca ok, Phos slightly high. Resume binders/VDRA. 6. Nutrition: Resumed renal diet with fluid restriction 7.  Paroxysmal atrial fibrillation with rapid ventricular response: Back in sinus rhythm/rate controlled and now back on Eliquis following transient heparin drip.  Veneta Penton, PA-C 07/23/2020, 2:05 PM  [employer]

## 2020-07-23 NOTE — Consult Note (Signed)
   [facility] CM Inpatient Consult   07/23/2020  Ilze S Standlee 11/06/1940 [id number]  [facility] [[facility]]  Accountable Care Organization [ACO] Patient: [employer]    Patient screened for high score for unplanned readmission risk and for length of stay 11 days hospitalization to check if potential [facility]  [[facility]] Care Management service needs.  Review of patient's medical record reveals patient is being recommended for a skilled nursing facility level of care for transition.  Plan:  Continue to follow progress and disposition to assess for post hospital care management needs.  If patient transitions to a SNF needs for transition are to be met at that level of care.  For questions contact:   Victoria Brewer, RN BSN CCM [facility] Hospital Liaison  [phone] business mobile phone Toll free office [phone]  Fax number: [phone] [email or web address]       

## 2020-07-23 NOTE — Progress Notes (Signed)
Report given to Dialysis RN.

## 2020-07-23 NOTE — Progress Notes (Signed)
   07/23/20 1116  Vitals  Temp 98.4 F (36.9 C)  Temp Source Oral  BP 136/78  BP Location Right Arm  BP Method Automatic  Patient Position (if appropriate) Lying  Pulse Rate 88  Pulse Rate Source Monitor  Resp 17  Oxygen Therapy  SpO2 97 %  O2 Device Room Air  Dialysis Weight  Weight 74.6 kg  Type of Weight Post-Dialysis  Post-Hemodialysis Assessment  Rinseback Volume (mL) 250 mL  KECN 270 V  Dialyzer Clearance Lightly streaked  Duration of HD Treatment -hour(s) 3.5 hour(s)  Hemodialysis Intake (mL) 700 mL  UF Total -Machine (mL) 1717 mL  Net UF (mL) 1017 mL  Tolerated HD Treatment Yes  Post-Hemodialysis Comments tx complete-pt stable  Hemodialysis Catheter Right Internal jugular Double lumen Permanent (Tunneled)  Placement Date/Time: 07/12/20 1753   Placed prior to admission: Yes  Orientation: Right  Access Location: Internal jugular  Hemodialysis Catheter Type: Double lumen Permanent (Tunneled)  Site Condition No complications  Blue Lumen Status Flushed;Capped (Central line);Heparin locked  Red Lumen Status Flushed;Heparin locked;Capped (Central line)  Purple Lumen Status N/A  Catheter fill solution Heparin 1000 units/ml  Catheter fill volume (Arterial) 1.9 cc  Catheter fill volume (Venous) 1.9  Dressing Type Occlusive  Dressing Status Clean;Intact;Dry  Antimicrobial disc in place? Yes  Drainage Description None  Dressing Change Due 07/28/20  Post treatment catheter status Capped and Clamped  HD tx complete, pt noted with SBP 80s-90s midtx, reports feeling "hot", UF stopped and then resumed, total of NS 200 ml given, with positive effects noted. Pt stable, UF=1 liter net.

## 2020-07-24 DIAGNOSIS — J1282 Pneumonia due to coronavirus disease 2019: Secondary | ICD-10-CM | POA: Diagnosis not present

## 2020-07-24 DIAGNOSIS — U071 COVID-19: Secondary | ICD-10-CM | POA: Diagnosis not present

## 2020-07-24 LAB — GLUCOSE, CAPILLARY
Glucose-Capillary: 123 mg/dL — ABNORMAL HIGH (ref 70–99)
Glucose-Capillary: 148 mg/dL — ABNORMAL HIGH (ref 70–99)
Glucose-Capillary: 212 mg/dL — ABNORMAL HIGH (ref 70–99)
Glucose-Capillary: 243 mg/dL — ABNORMAL HIGH (ref 70–99)
Glucose-Capillary: 253 mg/dL — ABNORMAL HIGH (ref 70–99)
Glucose-Capillary: 255 mg/dL — ABNORMAL HIGH (ref 70–99)

## 2020-07-24 MED ORDER — INSULIN GLARGINE 100 UNIT/ML ~~LOC~~ SOLN
10.0000 [IU] | Freq: Once | SUBCUTANEOUS | Status: AC
  Administered 2020-07-24: 17:00:00 10 [IU] via SUBCUTANEOUS
  Filled 2020-07-24: qty 0.1

## 2020-07-24 MED ORDER — INSULIN GLARGINE 100 UNIT/ML ~~LOC~~ SOLN
30.0000 [IU] | Freq: Every day | SUBCUTANEOUS | Status: DC
  Administered 2020-07-25 – 2020-07-30 (×6): 30 [IU] via SUBCUTANEOUS
  Filled 2020-07-24 (×7): qty 0.3

## 2020-07-24 NOTE — Progress Notes (Signed)
SLP Cancellation Note  Patient Details Name: [name] MRN: [id number] DOB: 01-29-1941   Cancelled treatment:       Reason Eval/Treat Not Completed: Patient at procedure or test/unavailable (Pt receiving care from NT at this time.)  Shantel Helwig I. [name], [place], [place] Office number [phone] Pager [place] 07/24/2020, 11:54 AM

## 2020-07-24 NOTE — Progress Notes (Signed)
Transitions of Care Pharmacist Note  [name] is a 79 y.o. female that has been diagnosed with A Fib and will be prescribed Eliquis (apixaban) at discharge.   Patient Education: I provided the following education on apixaban to the patient: How to take the medication Described what the medication is Signs of bleeding Signs/symptoms of VTE and stroke  Answered their questions  Discharge Medications Plan: SNF  Thank you,   Shauna Hugh, PharmD, [place]  PGY-1 Pharmacy Resident 07/24/2020 6:22 PM  Please check [email or web address] for unit-specific pharmacy phone numbers.

## 2020-07-24 NOTE — Progress Notes (Signed)
PROGRESS NOTE                                                                                                                                                                                                             Patient Demographics:    [name], is a 79 y.o. female, DOB - [birth year], WSF:[id number]  Outpatient Primary MD for the patient is [name], MD    LOS - 12  Admit date - 07/12/2020    Chief Complaint  Patient presents with  . Code Stroke  . GI Bleeding       Brief Narrative (HPI from H&P) - this is a 79 year old female with history of hypertension, lateral hip fractures, right-sided breast cancer s/p treatment including lumpectomy about 5 years ago, ESRD, dyslipidemia, DM type II, A. fib who was brought in the hospital on 07/12/2020 with altered mental status, was found to have ischemic stroke with hemorrhagic conversion, her stay was complicated by GI bleed and COVID-19 infection.  She was initially admitted under PCCM and was transferred to hospitalist service on 07/15/2020.  She has been seen so far by nephrology, neurology and PCCM.   Subjective:   Patient seen and examined.  Sitting at the edge of the bed.  She has no complaints.  She was not hypoxic.  Denied any complaint at all.  Completely alert and oriented.   Assessment  & Plan :     1. Acute right basal ganglia hemorrhagic stroke- with minimal left-sided weakness, this appears to be ischemic infarct with hemorrhagic conversion due to A. Fib.  Seen by stroke team, case discussed with Dr. Leonie Man on 07/15/2020.  For now baby aspirin along with home dose statin.  LDL was within acceptable limits, echocardiogram nonacute, A1c slightly elevated will defer for PCP for better glycemic control.  Continue PT-OT and speech therapy.  Now placed on Eliquis along with home dose statin for secondary prevention, discussed with Dr. Leonie Man no new recommendations,  will be discharged to SNF.  2.  A. fib RVR.  Likely found this admission.  She is currently in and out of A. fib and RVR, have started her on Cardizem, amiodarone added by cardiology, stable TSH & echocardiogram noted with preserved EF.    On Eliquis now outpatient cardiology follow-up in 1 to 2 weeks post discharge.  3.  Acute GI bleed with blood loss related anemia.  Appears to be upper GI.  S/p packed RBC transfusion on 07/12/2020 1 unit, monitor H&H, PPI, GI on board and she underwent EGD on 07/17/2020, discussed the case with Dr. Nicki Guadalajara and Dr. Leonie Man neurology, okay for heparin drip which was started on 07/18/2020 along with twice daily PPI.  No further bleeding and switched to Eliquis on 07/19/2020.  H&H relatively stable.  4.  ESRD.  On MWF schedule.    Neurology following continue outpatient regimen post discharge.  5.  Dyslipidemia.  On statin.  6.  Essential hypertension.  Very well controlled.  Continue diltiazem and hydralazine as well as as needed metoprolol.   7.  Mild metabolic encephalopathy.  She is completely alert and oriented at this point in time.  8.  COVID-19 infection diagnosed on 07/07/2020.  S/p antibody infusion.  Monitor.    9.  History of right-sided breast cancer.  Outpatient follow-up with oncology doses 79-year-old problem.    10.  Hip pain.  Chronic.  CT scan stable.  Monitor with supportive care.  PT OT.    Stable.  11. DM type II.  Hemoglobin A1c 7.9 on 07/13/2020.  On glipizide at home which is on hold.  Blood sugar still elevated despite of being on 20 units of Lantus.  Will increase to 30 units.  She only received 20 units so I will add 10 more today and starting tomorrow, she will be on 30 units.  Continue SSI.  Lab Results  Component Value Date   HGBA1C 7.9 (H) 07/13/2020    CBG (last 3)  Recent Labs    07/24/20 0457 07/24/20 0753 07/24/20 1154  GLUCAP 123* 148* 255*    Lab Results  Component Value Date   CHOL 99  07/14/2020   HDL 31 (L) 07/14/2020   LDLCALC 32 07/14/2020   TRIG 178 (H) 07/14/2020   CHOLHDL 3.2 07/14/2020    Lab Results  Component Value Date   HGBA1C 7.9 (H) 07/13/2020      Lab Results  Component Value Date   TSH 1.219 07/16/2020        Condition -stable  Family Communication  : none.  Patient alert and oriented.  Code Status :  Full  Consults  : Neurology, PCCM, nephrology, cardiology  Procedures  :    EGD 12/23 - gastritis and duodenitis.  CT HEAD  07/12/2020  -   Acute 7 mm hemorrhage within the right basal ganglia. No substantial mass effect. Chronic microvascular ischemic disease and remote right cerebellar lacunar infarct.   CT HEAD WO CONTRAST - 07/13/2020 expected evolutionary changes in the acute right basal ganglia hemorrhage overall similar in size with slightly decreased density compared to previous CT.  No new abnormality.  MRI / MRA Head - unchanged 1 cm right posterior lentiform nucleus/internal capsule hemorrhage without significant surrounding edema or mass-effect.  4 mm diffusion abnormality involving subcortical white matter in the right parietal corona radiata consistent with acute small vessel type infarct.    MRA of the brain technically limited due to motion artifact but no large vessel occlusion.  Focal high-grade stenosis of the right distal M1 segment and distal left M1 segment bifurcation.  Severe multifocal bilateral P2 and distal left P3 stenosis.  Transthoracic Echocardiogram - left ventricular ejection fraction 60 to 65%.  00/00/2021  Bilateral Carotid Dopplers - bilateral 1-39% stenosis of carotids.    PUD Prophylaxis : PPI  Disposition Plan  :  Status is: Inpatient  Remains inpatient appropriate because:IV treatments appropriate due to intensity of illness or inability to take PO   Dispo: The patient is from: Home              Anticipated d/c is to: SNF              Anticipated d/c date is: > 3 days               Patient currently is medically stable to d/c.   DVT Prophylaxis  :   SCDs >> Heparin   Lab Results  Component Value Date   PLT 216 07/23/2020    Diet :  Diet Order            DIET SOFT Room service appropriate? Yes; Fluid consistency: Thin  Diet effective now                  Inpatient Medications  Scheduled Meds: . amiodarone  200 mg Oral Daily  . apixaban  2.5 mg Oral BID  . aspirin EC  81 mg Oral Daily  . atorvastatin  20 mg Oral QHS  . chlorhexidine  15 mL Mouth Rinse BID  . Chlorhexidine Gluconate Cloth  6 each Topical Daily  . diltiazem  120 mg Oral Daily  . hydrALAZINE  25 mg Oral Q8H  . insulin aspart  0-6 Units Subcutaneous Q4H  . insulin glargine  20 Units Subcutaneous Daily  . mouth rinse  15 mL Mouth Rinse q12n4p  . midodrine  10 mg Oral Once  . pantoprazole  40 mg Oral BID   Continuous Infusions: . sodium chloride Stopped (07/17/20 1515)   PRN Meds:.sodium chloride, docusate sodium, guaiFENesin-dextromethorphan, heparin sodium (porcine), metoprolol tartrate, ondansetron (ZOFRAN) IV, polyethylene glycol, sodium chloride flush  Antibiotics  :    Anti-infectives (From admission, onward)   None       Time Spent in minutes 28   Darliss Cheney M.D on 07/24/2020 at 1:07 PM  To page go to [email or web address]   [facility] -  Office  [phone]   See all Orders from today for further details    Objective:   Vitals:   07/23/20 2359 07/24/20 0454 07/24/20 0750 07/24/20 1151  BP: 138/73 (!) 159/73 (!) 156/76 (!) 141/73  Pulse: 78 72 72 82  Resp: 19 20 20 20   Temp: 98.5 F (36.9 C) 98.5 F (36.9 C) 98.5 F (36.9 C) 98 F (36.7 C)  TempSrc: Oral Oral Oral Oral  SpO2: 99% 98% 96% 99%  Weight: 74.6 kg 74.6 kg    Height:        Wt Readings from Last 3 Encounters:  07/24/20 74.6 kg  04/21/20 77.9 kg  04/20/19 90.2 kg     Intake/Output Summary (Last 24 hours) at 07/24/2020 1307 Last data filed at 07/23/2020 1332 Gross per 24 hour   Intake 250 ml  Output --  Net 250 ml     Physical Exam  General exam: Appears calm and comfortable  Respiratory system: Clear to auscultation. Respiratory effort normal. Cardiovascular system: S1 & S2 heard, RRR. No JVD, murmurs, rubs, gallops or clicks. No pedal edema. Gastrointestinal system: Abdomen is nondistended, soft and nontender. No organomegaly or masses felt. Normal bowel sounds heard. Central nervous system: Alert and oriented. No focal neurological deficits. Extremities: Symmetric 5 x 5 power. Skin: No rashes, lesions or ulcers.  Psychiatry: Judgement and insight appear normal. Mood & affect appropriate.      Data Review:  CBC Recent Labs  Lab 07/18/20 0050 07/19/20 0200 07/20/20 0524 07/21/20 0503 07/22/20 0240 07/23/20 0658  WBC 5.1 5.1 5.0 5.0 5.5 6.5  HGB 12.0 11.5* 11.0* 10.3* 10.1* 10.3*  HCT 37.7 36.3 34.7* 33.4* 32.2* 32.5*  PLT 130* 157 176 194 181 216  MCV 90.8 90.8 90.6 91.5 91.2 90.5  MCH 28.9 28.8 28.7 28.2 28.6 28.7  MCHC 31.8 31.7 31.7 30.8 31.4 31.7  RDW 17.2* 17.2* 17.0* 17.0* 16.8* 16.8*  LYMPHSABS 0.7 0.9 0.9 0.9 0.9  --   MONOABS 1.1* 1.1* 0.9 0.9 0.8  --   EOSABS 0.0 0.1 0.1 0.1 0.1  --   BASOSABS 0.0 0.0 0.0 0.0 0.0  --     Recent Labs  Lab 07/18/20 0050 07/19/20 0200 07/21/20 0503 07/23/20 0658  NA 137 135 133* 132*  K 3.9 4.0 4.5 4.4  CL 98 96* 97* 95*  CO2 25 23 24 23   GLUCOSE 140* 133* 158* 182*  BUN 17 34* 27* 43*  CREATININE 4.37* 6.30* 5.38* 5.99*  CALCIUM 8.6* 8.7* 8.6* 8.9  AST 49* 45*  --   --   ALT 28 31  --   --   ALKPHOS 79 80  --   --   BILITOT 0.9 0.9  --   --   ALBUMIN 2.8* 2.7*  --  3.0*  MG 1.8 1.8  --   --   CRP 8.9* 8.4*  --   --   DDIMER 2.21* 1.47*  --   --   BNP 179.8* 113.8*  --   --     ------------------------------------------------------------------------------------------------------------------ No results for input(s): CHOL, HDL, LDLCALC, TRIG, CHOLHDL, LDLDIRECT in the last 72  hours.  Lab Results  Component Value Date   HGBA1C 7.9 (H) 07/13/2020   ------------------------------------------------------------------------------------------------------------------ No results for input(s): TSH, T4TOTAL, T3FREE, THYROIDAB in the last 72 hours.  Invalid input(s): FREET3  Cardiac Enzymes No results for input(s): CKMB, TROPONINI, MYOGLOBIN in the last 168 hours.  Invalid input(s): CK ------------------------------------------------------------------------------------------------------------------    Component Value Date/Time   BNP 113.8 (H) 07/19/2020 0200    Micro Results Recent Results (from the past 240 hour(s))  SARS CORONAVIRUS 2 (TAT 6-24 HRS) Nasopharyngeal Nasopharyngeal Swab     Status: Abnormal   Collection Time: 07/21/20  1:34 PM   Specimen: Nasopharyngeal Swab  Result Value Ref Range Status   SARS Coronavirus 2 POSITIVE (A) NEGATIVE Final    Comment: RESULT CALLED TO, READ BACK BY AND VERIFIED WITH: F.AFOR,RN @2142  07/21/2020 VANG.J (NOTE) SARS-CoV-2 target nucleic acids are DETECTED.  The SARS-CoV-2 RNA is generally detectable in upper and lower respiratory specimens during the acute phase of infection. Positive results are indicative of the presence of SARS-CoV-2 RNA. Clinical correlation with patient history and other diagnostic information is  necessary to determine patient infection status. Positive results do not rule out bacterial infection or co-infection with other viruses.  The expected result is Negative.  Fact Sheet for Patients: SugarRoll.be  Fact Sheet for Healthcare Providers: https://www.woods-mathews.com/  This test is not yet approved or cleared by the [place] FDA and  has been authorized for detection and/or diagnosis of SARS-CoV-2 by FDA under an Emergency Use Authorization (EUA). This EUA will remain  in effect (meaning this test can be used) f or the duration of  the COVID-19 declaration under Section 564(b)(1) of the Act, 21 U.S.C. section 360bbb-3(b)(1), unless the authorization is terminated or revoked sooner.   Performed at [facility], [place]  [address] [place], [place] [address]     Radiology Reports DG Chest 1 View  Result Date: 07/12/2020 CLINICAL DATA:  Patient with history of COVID-19. History of GI bleed. EXAM: CHEST  1 VIEW COMPARISON:  Chest radiograph 07/10/2020. FINDINGS: Stable right-sided dialysis catheter. Monitoring leads overlie the patient. Stable cardiomegaly. Aortic atherosclerosis. Site oval increased patchy opacities left mid lower lung. No definite pleural effusion or pneumothorax. Thoracic spine degenerative changes. Surgical clips right axilla. IMPRESSION: Increasing patchy opacities left mid and lower lung may represent infection in the appropriate clinical setting or potentially atelectasis. Electronically Signed   By: Lovey Newcomer M.D.   On: 07/12/2020 11:24   CT HEAD WO CONTRAST  Result Date: 07/16/2020 CLINICAL DATA:  Stroke follow-up. EXAM: CT HEAD WITHOUT CONTRAST TECHNIQUE: Contiguous axial images were obtained from the base of the skull through the vertex without intravenous contrast. COMPARISON:  07/13/2020 head CT and MRI FINDINGS: Brain: Approximately 1 cm hyperdense focus at the posterior aspect of the right lentiform nucleus corresponding to the previously demonstrated hemorrhage is unchanged in size. There is no associated edema. No new intracranial hemorrhage, acute large territory infarct, mass, midline shift, or extra-axial fluid collection is identified. Mild cerebral atrophy is within normal limits for age. Hypodensities in the cerebral white matter bilaterally are unchanged and nonspecific but compatible with mild to moderate chronic small vessel ischemic disease. Vascular: Calcified atherosclerosis at the skull base. Skull: No fracture or suspicious osseous lesion. Sinuses/Orbits: Moderate mucosal  thickening in the frontal sinuses. Small right mastoid effusion. Bilateral cataract extraction. Other: None. IMPRESSION: 1. Unchanged 1 cm right basal ganglia hemorrhage. 2. No new intracranial abnormality. Electronically Signed   By: Logan Bores M.D.   On: 07/16/2020 15:15   CT HEAD WO CONTRAST  Result Date: 07/13/2020 CLINICAL DATA:  Follow-up examination of basal ganglia hemorrhage. EXAM: CT HEAD WITHOUT CONTRAST TECHNIQUE: Contiguous axial images were obtained from the base of the skull through the vertex without intravenous contrast. COMPARISON:  Prior CT from 07/12/2020 FINDINGS: Brain: Previously identified intraparenchymal hemorrhage at the posterior right lentiform nucleus again seen. Bleed has mildly dispersed and is less dense in appearance as compared to previous exam, measuring overall similar in size at 10 x 7 x 10 mm, previously 10 x 5 x 10 mm when measured in a similar fashion. No significant surrounding edema or regional mass effect. No other acute or interval intracranial hemorrhage. No other acute large vessel territory infarct. No mass lesion, mass effect, or midline shift. No hydrocephalus or extra-axial fluid collection. Atrophy with chronic microvascular ischemic disease again noted. Vascular: No hyperdense vessel. Calcified atherosclerosis at the skull base. Skull: Scalp soft tissues and calvarium within normal limits. Sinuses/Orbits: Globes and orbital soft tissues demonstrate no acute finding. Mild mucoperiosteal thickening noted throughout the visualized paranasal sinuses. Mastoid air cells remain clear. Other: None. IMPRESSION: 1. Normal expected interval evolution of acute intraparenchymal hemorrhage positioned at the posterior right lentiform nucleus, overall similar in size but with slightly decreased density as compared to previous. No significant surrounding edema or regional mass effect. 2. No other new acute intracranial abnormality. 3. Atrophy with chronic microvascular  ischemic disease. Electronically Signed   By: Jeannine Boga M.D.   On: 07/13/2020 18:45   CT PELVIS WO CONTRAST  Result Date: 07/14/2020 CLINICAL DATA:  Right hip pain EXAM: CT PELVIS WITHOUT CONTRAST TECHNIQUE: Multidetector CT imaging of the pelvis was performed following the standard protocol without intravenous contrast. COMPARISON:  Radiographs 07/12/2020 FINDINGS: Urinary Tract: Bladder and distal  ureters obscured by streak artifact from the patient's bilateral hip hardware. Bowel:  Unremarkable, rectum partially obscured by streak artifact. Vascular/Lymphatic: Aortoiliac atherosclerotic vascular disease. No pathologic adenopathy identified. Reproductive:  Unremarkable Other:  No supplemental non-categorized findings. Musculoskeletal: Right hip screw with IM nail. Single distal interlocking screw along the IM nail. Inter trochanteric deformity from old fracture, no acute fracture is identified. Severe degenerative right hip arthropathy with loss of articular space and spurring. Left hip hemiarthroplasty without discrete complicating feature identified. Mild spurring along the SI joints. There is lower lumbar spondylosis and degenerative disc disease leading to moderate left foraminal stenosis at L5-S1. IMPRESSION: 1. Severe degenerative right hip arthropathy. 2. Right hip screw with IM nail. Right intertrochanteric deformity from old fracture. No acute fracture is identified. 3. Left hip hemiarthroplasty without discrete complicating feature identified. 4. Lower lumbar spondylosis and degenerative disc disease leading to moderate left foraminal stenosis at L5-S1. 5. Aortic atherosclerosis. Aortic Atherosclerosis (ICD10-I70.0). Electronically Signed   By: Van Clines M.D.   On: 07/14/2020 14:12   MR ANGIO HEAD WO CONTRAST  Result Date: 07/13/2020 CLINICAL DATA:  Follow-up examination for acute stroke. EXAM: MRI HEAD WITHOUT CONTRAST MRA HEAD WITHOUT CONTRAST TECHNIQUE: Multiplanar,  multiecho pulse sequences of the brain and surrounding structures were obtained without intravenous contrast. Angiographic images of the head were obtained using MRA technique without contrast. COMPARISON:  Prior head CT from 07/12/2020 as well as previous brain MRI from 08/17/2016. FINDINGS: MRI HEAD FINDINGS Brain: Diffuse prominence of the CSF containing spaces compatible with generalized age-related cerebral atrophy. Patchy and confluent T2/FLAIR hyperintensity within the periventricular and deep white matter both cerebral hemispheres most likely related chronic microvascular ischemic disease. Patchy involvement of the pons. Overall, these changes are mildly progressed as compared to 2018. Few small remote lacunar type infarcts noted about the basal ganglia, most notable which present at the right caudothalamic groove. Previously identified hemorrhage centered at the posterior right lentiform nucleus/right internal capsule again seen, stable in size measuring up to approximately 1 cm in greatest dimension. This is somewhat difficult to visualized by MR, and is most notable on T1 weighted sequence (series 16, image 26). No significant surrounding edema or regional mass effect. No other acute intracranial hemorrhage. Few scattered chronic micro hemorrhages noted at the right cerebellum and pons, likely small vessel/hypertensive in nature. Single punctate 4 mm focus of diffusion abnormality involving the subcortical white matter of the right parietal corona radiata noted, likely a small acute to subacute small vessel type ischemic infarct (series 5, image 74). No associated hemorrhage or mass effect. No other diffusion abnormality to suggest acute or subacute ischemia. Gray-white matter differentiation otherwise maintained. No mass lesion, mass effect, or midline shift. No hydrocephalus or extra-axial fluid collection. Pituitary gland suprasellar region normal. Midline structures intact. 1.3 cm ovoid  well-circumscribed lesion positioned at the left petrous apex demonstrating heterogeneous T2/FLAIR signal intensity with hyperintense T1 signal intensity noted (series 16, image 14), nonspecific, but could reflect a small cholesterol granuloma versus asymmetric fatty marrow. Finding is relatively stable from previous MRI from 2018, and of doubtful significance. Vascular: Major intracranial vascular flow voids are maintained. Skull and upper cervical spine: Craniocervical junction within normal limits. Multilevel degenerative spondylosis noted within the visualized upper cervical spine with resultant mild-to-moderate spinal stenosis. Bone marrow signal intensity within normal limits. No scalp soft tissue abnormality. Sinuses/Orbits: Patient status post bilateral ocular lens replacement. Globes and orbital soft tissues demonstrate no acute finding. Mild scattered mucosal thickening noted throughout the paranasal  sinuses. Trace bilateral mastoid effusions, of doubtful significance. Visualized nasopharynx within normal limits. Other: None. MRA HEAD FINDINGS ANTERIOR CIRCULATION: Examination degraded by motion artifact. Visualized distal cervical segments of the internal carotid arteries are patent with symmetric antegrade flow. Petrous segments widely patent. Atheromatous irregularity throughout the carotid siphons with associated mild multifocal narrowing. A1 segments patent bilaterally. Normal anterior communicating artery complex. Anterior cerebral arteries patent to their distal aspects without high-grade stenosis. Short-segment severe stenosis at the distal left M1 segment/left MCA bifurcation (series 1036, image 12). Left MCA branches are diffusely irregular but perfused distally. Right M1 segment patent proximally. Focal signal cutoff at the mid-distal right M1 segment into the bifurcation (series 1036, image 11). This likely reflects a severe high-grade stenosis, as the right MCA branches are perfused distally.  Diffuse atheromatous irregularity seen throughout the right MCA branches as well. Proximally. POSTERIOR CIRCULATION: Vertebral arteries largely code dominant and patent to the vertebrobasilar junction. Both PICA origins patent and normal. Basilar patent to its distal aspect without stenosis. Superior cerebral arteries patent bilaterally. Both PCAs primarily supplied via the basilar. Severe long segment stenosis involving the mid-distal right P2 segment (series 1046, image 15). Severe multifocal distal left P2 and P3 stenoses present as well (series 1046, image 14). PCAs remain perfused to their distal aspects. No intracranial aneurysm or other vascular abnormality. IMPRESSION: MRI HEAD IMPRESSION: 1. Unchanged 1 cm hemorrhage centered at the posterior right lentiform nucleus/right internal capsule. No significant surrounding edema or regional mass effect. 2. 4 mm focus of diffusion abnormality involving the subcortical white matter of the right parietal corona radiata, consistent with a small acute to subacute small vessel type ischemic infarct. No associated hemorrhage. 3. Underlying age-related cerebral atrophy with moderate chronic microvascular ischemic disease, mildly progressed as compared to 2018. MRA HEAD IMPRESSION: 1. Technically limited exam due to motion artifact. 2. Negative intracranial MRA for large vessel occlusion. 3. Focal severe high-grade stenoses involving the mid-distal right M1 segment and distal left M1 segment/left MCA bifurcation. Left MCA branches remain perfused distally. 4. Severe multifocal bilateral P2 and distal left P3 stenoses as above. Electronically Signed   By: Jeannine Boga M.D.   On: 07/13/2020 19:16   MR BRAIN WO CONTRAST  Result Date: 07/13/2020 CLINICAL DATA:  Follow-up examination for acute stroke. EXAM: MRI HEAD WITHOUT CONTRAST MRA HEAD WITHOUT CONTRAST TECHNIQUE: Multiplanar, multiecho pulse sequences of the brain and surrounding structures were obtained  without intravenous contrast. Angiographic images of the head were obtained using MRA technique without contrast. COMPARISON:  Prior head CT from 07/12/2020 as well as previous brain MRI from 08/17/2016. FINDINGS: MRI HEAD FINDINGS Brain: Diffuse prominence of the CSF containing spaces compatible with generalized age-related cerebral atrophy. Patchy and confluent T2/FLAIR hyperintensity within the periventricular and deep white matter both cerebral hemispheres most likely related chronic microvascular ischemic disease. Patchy involvement of the pons. Overall, these changes are mildly progressed as compared to 2018. Few small remote lacunar type infarcts noted about the basal ganglia, most notable which present at the right caudothalamic groove. Previously identified hemorrhage centered at the posterior right lentiform nucleus/right internal capsule again seen, stable in size measuring up to approximately 1 cm in greatest dimension. This is somewhat difficult to visualized by MR, and is most notable on T1 weighted sequence (series 16, image 26). No significant surrounding edema or regional mass effect. No other acute intracranial hemorrhage. Few scattered chronic micro hemorrhages noted at the right cerebellum and pons, likely small vessel/hypertensive in nature. Single punctate 4  mm focus of diffusion abnormality involving the subcortical white matter of the right parietal corona radiata noted, likely a small acute to subacute small vessel type ischemic infarct (series 5, image 74). No associated hemorrhage or mass effect. No other diffusion abnormality to suggest acute or subacute ischemia. Gray-white matter differentiation otherwise maintained. No mass lesion, mass effect, or midline shift. No hydrocephalus or extra-axial fluid collection. Pituitary gland suprasellar region normal. Midline structures intact. 1.3 cm ovoid well-circumscribed lesion positioned at the left petrous apex demonstrating heterogeneous  T2/FLAIR signal intensity with hyperintense T1 signal intensity noted (series 16, image 14), nonspecific, but could reflect a small cholesterol granuloma versus asymmetric fatty marrow. Finding is relatively stable from previous MRI from 2018, and of doubtful significance. Vascular: Major intracranial vascular flow voids are maintained. Skull and upper cervical spine: Craniocervical junction within normal limits. Multilevel degenerative spondylosis noted within the visualized upper cervical spine with resultant mild-to-moderate spinal stenosis. Bone marrow signal intensity within normal limits. No scalp soft tissue abnormality. Sinuses/Orbits: Patient status post bilateral ocular lens replacement. Globes and orbital soft tissues demonstrate no acute finding. Mild scattered mucosal thickening noted throughout the paranasal sinuses. Trace bilateral mastoid effusions, of doubtful significance. Visualized nasopharynx within normal limits. Other: None. MRA HEAD FINDINGS ANTERIOR CIRCULATION: Examination degraded by motion artifact. Visualized distal cervical segments of the internal carotid arteries are patent with symmetric antegrade flow. Petrous segments widely patent. Atheromatous irregularity throughout the carotid siphons with associated mild multifocal narrowing. A1 segments patent bilaterally. Normal anterior communicating artery complex. Anterior cerebral arteries patent to their distal aspects without high-grade stenosis. Short-segment severe stenosis at the distal left M1 segment/left MCA bifurcation (series 1036, image 12). Left MCA branches are diffusely irregular but perfused distally. Right M1 segment patent proximally. Focal signal cutoff at the mid-distal right M1 segment into the bifurcation (series 1036, image 11). This likely reflects a severe high-grade stenosis, as the right MCA branches are perfused distally. Diffuse atheromatous irregularity seen throughout the right MCA branches as well.  Proximally. POSTERIOR CIRCULATION: Vertebral arteries largely code dominant and patent to the vertebrobasilar junction. Both PICA origins patent and normal. Basilar patent to its distal aspect without stenosis. Superior cerebral arteries patent bilaterally. Both PCAs primarily supplied via the basilar. Severe long segment stenosis involving the mid-distal right P2 segment (series 1046, image 15). Severe multifocal distal left P2 and P3 stenoses present as well (series 1046, image 14). PCAs remain perfused to their distal aspects. No intracranial aneurysm or other vascular abnormality. IMPRESSION: MRI HEAD IMPRESSION: 1. Unchanged 1 cm hemorrhage centered at the posterior right lentiform nucleus/right internal capsule. No significant surrounding edema or regional mass effect. 2. 4 mm focus of diffusion abnormality involving the subcortical white matter of the right parietal corona radiata, consistent with a small acute to subacute small vessel type ischemic infarct. No associated hemorrhage. 3. Underlying age-related cerebral atrophy with moderate chronic microvascular ischemic disease, mildly progressed as compared to 2018. MRA HEAD IMPRESSION: 1. Technically limited exam due to motion artifact. 2. Negative intracranial MRA for large vessel occlusion. 3. Focal severe high-grade stenoses involving the mid-distal right M1 segment and distal left M1 segment/left MCA bifurcation. Left MCA branches remain perfused distally. 4. Severe multifocal bilateral P2 and distal left P3 stenoses as above. Electronically Signed   By: Jeannine Boga M.D.   On: 07/13/2020 19:16   DG Pelvis Portable  Result Date: 07/12/2020 CLINICAL DATA:  Right hip pain.  No reported injury. EXAM: PORTABLE PELVIS 1-2 VIEWS COMPARISON:  07/21/2018 outside  pelvic radiographs FINDINGS: Partially visualized left hip hemiarthroplasty and partially visualized fixation hardware in the proximal right femur with no evidence of hardware fracture or  loosening. No evidence of hip dislocation on this single frontal view. No osseous fracture. No focal osseous lesions. Moderate right hip osteoarthritis. No suspicious focal osseous lesions. No pelvic diastasis. IMPRESSION: No acute osseous abnormality. No evidence of hip dislocation on this single frontal view. Moderate right hip osteoarthritis. Partially visualized left hip hemiarthroplasty and proximal right femur fixation hardware, with no evidence of hardware complication. Electronically Signed   By: Ilona Sorrel M.D.   On: 07/12/2020 12:33   DG CHEST PORT 1 VIEW  Result Date: 07/15/2020 CLINICAL DATA:  COVID-19. EXAM: PORTABLE CHEST 1 VIEW COMPARISON:  07/12/2020. FINDINGS: Right dual-lumen catheter stable position. Stable cardiomegaly. Progressive bilateral pulmonary infiltrates/edema. Tiny bilateral pleural effusions cannot be excluded. No pneumothorax. Degenerative change thoracic spine. Surgical clips right chest. IMPRESSION: 1. Right dual-lumen catheter in stable position. 2. Stable cardiomegaly. 3. Progressive bilateral pulmonary infiltrates/edema. Tiny bilateral pleural effusions cannot be excluded. Electronically Signed   By: Marcello Moores  Register   On: 07/15/2020 05:24   DG Abd Portable 1V  Result Date: 07/13/2020 CLINICAL DATA:  Evaluate orogastric tube EXAM: PORTABLE ABDOMEN - 1 VIEW COMPARISON:  July 13, 2020 FINDINGS: The OG tube side port and distal tip are in the region the stomach. IMPRESSION: The OG tube is in good position. Electronically Signed   By: Dorise Bullion III M.D   On: 07/13/2020 11:04   DG Abd Portable 1V  Result Date: 07/13/2020 CLINICAL DATA:  Vomiting. EXAM: PORTABLE ABDOMEN - 1 VIEW COMPARISON:  None. FINDINGS: No evidence of dilated bowel loops. Aortic atherosclerotic calcification noted. Compression screw noted in the right hip. Left hip prosthesis is also seen. IMPRESSION: Unremarkable bowel gas pattern.  No acute findings. Electronically Signed   By: Marlaine Hind M.D.   On: 07/13/2020 05:08   ECHOCARDIOGRAM COMPLETE  Result Date: 07/14/2020    ECHOCARDIOGRAM REPORT   Patient Name:   SARISSA DERN Date of Exam: 07/14/2020 Medical Rec #:  [id number]     Height:       64.0 in Accession #:    [id number]    Weight:       174.6 lb Date of Birth:  1941-01-21      BSA:          1.847 m Patient Age:    32 years      BP:           114/75 mmHg Patient Gender: F             HR:           77 bpm. Exam Location:  Inpatient Procedure: 2D Echo, Color Doppler and Cardiac Doppler Indications:    Stroke 434.91 / I163.9  History:        Patient has no prior history of Echocardiogram examinations.                 Risk Factors:Dyslipidemia and Diabetes.  Sonographer:    Bernadene Person RDCS Referring Phys: [place]  1. Left ventricular ejection fraction, by estimation, is 60 to 65%. The left ventricle has normal function. The left ventricle has no regional wall motion abnormalities. There is moderate left ventricular hypertrophy. Left ventricular diastolic parameters are consistent with Grade I diastolic dysfunction (impaired relaxation). Elevated left ventricular end-diastolic pressure. The E/e' is 58.  2. Right ventricular systolic function  is normal. The right ventricular size is normal.  3. The pericardial effusion is posterior to the left ventricle.  4. The mitral valve is abnormal. Mild mitral valve regurgitation. Moderate mitral annular calcification.  5. The aortic valve is tricuspid. Aortic valve regurgitation is not visualized. Mild aortic valve sclerosis is present, with no evidence of aortic valve stenosis. Comparison(s): No prior Echocardiogram. FINDINGS  Left Ventricle: Left ventricular ejection fraction, by estimation, is 60 to 65%. The left ventricle has normal function. The left ventricle has no regional wall motion abnormalities. The left ventricular internal cavity size was normal in size. There is  moderate left ventricular hypertrophy. Left  ventricular diastolic parameters are consistent with Grade I diastolic dysfunction (impaired relaxation). Elevated left ventricular end-diastolic pressure. The E/e' is 20. Right Ventricle: The right ventricular size is normal. No increase in right ventricular wall thickness. Right ventricular systolic function is normal. Left Atrium: Left atrial size was normal in size. Right Atrium: Right atrial size was normal in size. Pericardium: Trivial pericardial effusion is present. The pericardial effusion is posterior to the left ventricle. Mitral Valve: The mitral valve is abnormal. There is mild thickening of the mitral valve leaflet(s). Moderate mitral annular calcification. Mild mitral valve regurgitation, with posteriorly-directed jet. Tricuspid Valve: The tricuspid valve is grossly normal. Tricuspid valve regurgitation is trivial. Aortic Valve: The aortic valve is tricuspid. Aortic valve regurgitation is not visualized. Mild aortic valve sclerosis is present, with no evidence of aortic valve stenosis. Pulmonic Valve: The pulmonic valve was normal in structure. Pulmonic valve regurgitation is trivial. Aorta: The aortic root and ascending aorta are structurally normal, with no evidence of dilitation. Venous: The inferior vena cava was not well visualized. IAS/Shunts: No atrial level shunt detected by color flow Doppler.  LEFT VENTRICLE PLAX 2D LVIDd:         4.30 cm  Diastology LVIDs:         2.80 cm  LV e' medial:    5.66 cm/s LV PW:         1.20 cm  LV E/e' medial:  21.9 LV IVS:        1.20 cm  LV e' lateral:   6.20 cm/s LVOT diam:     2.10 cm  LV E/e' lateral: 20.0 LV SV:         103 LV SV Index:   56 LVOT Area:     3.46 cm  RIGHT VENTRICLE RV S prime:     13.90 cm/s TAPSE (M-mode): 1.8 cm LEFT ATRIUM             Index       RIGHT ATRIUM           Index LA diam:        3.30 cm 1.79 cm/m  RA Area:     13.60 cm LA Vol (A2C):   51.0 ml 27.62 ml/m RA Volume:   28.50 ml  15.43 ml/m LA Vol (A4C):   50.1 ml 27.13  ml/m LA Biplane Vol: 51.9 ml 28.10 ml/m  AORTIC VALVE LVOT Vmax:   143.00 cm/s LVOT Vmean:  98.200 cm/s LVOT VTI:    0.296 m  AORTA Ao Root diam: 3.00 cm Ao Asc diam:  3.40 cm MITRAL VALVE                TRICUSPID VALVE MV Area (PHT): 3.21 cm     TR Peak grad:   23.2 mmHg MV Decel Time: 236 msec     TR  Vmax:        241.00 cm/s MV E velocity: 124.00 cm/s MV A velocity: 120.00 cm/s  SHUNTS MV E/A ratio:  1.03         Systemic VTI:  0.30 m                             Systemic Diam: 2.10 cm Lyman Bishop MD Electronically signed by Lyman Bishop MD Signature Date/Time: 07/14/2020/2:10:04 PM    Final    CT HEAD CODE STROKE WO CONTRAST  Addendum Date: 07/12/2020   ADDENDUM REPORT: 07/12/2020 10:16 ADDENDUM: Correction.  Findings were discussed with Dr. Theda Sers. Electronically Signed   By: Margaretha Sheffield MD   On: 07/12/2020 10:16   Result Date: 07/12/2020 CLINICAL DATA:  Code stroke.  Neuro deficit, acute stroke suspected. EXAM: CT HEAD WITHOUT CONTRAST TECHNIQUE: Contiguous axial images were obtained from the base of the skull through the vertex without intravenous contrast. COMPARISON:  MRI head August 17, 2016 FINDINGS: Brain: Acute hemorrhage in the right basal ganglia, centered in the region of the posterior putamen/posterior limb of the internal capsule. The hemorrhage measures approximately 7 x 4 by 6 mm. No evidence of acute large vascular territory infarct. Patchy white matter hypoattenuation, which is nonspecific but most likely related to chronic microvascular ischemic disease. Mild generalized atrophy. No hydrocephalus. No evidence of a mass lesion. No midline shift. Remote right cerebellar lacunar infarct. Vascular: Calcific atherosclerosis. Skull: No acute fracture. Sinuses/Orbits: Sinuses are clear.  Unremarkable orbits. Other: No mastoid effusion. IMPRESSION: 1. Acute 7 mm hemorrhage within the right basal ganglia. No substantial mass effect. 2. Chronic microvascular ischemic disease and  remote right cerebellar lacunar infarct. Code stroke imaging results were communicated on 07/12/2020 at 10:00 am to provider Dr. Lorrin Goodell via telephone, who verbally acknowledged these results. Electronically Signed: By: Margaretha Sheffield MD On: 07/12/2020 10:05   VAS US CAROTID  Result Date: 07/14/2020 Carotid Arterial Duplex Study Indications:       CVA. Risk Factors:      Hyperlipidemia, Diabetes. Limitations        Today's exam was limited due to the patient's inability or                    unwillingness to cooperate. Comparison Study:  no prior Performing Technologist: Abram Sander RVS  Examination Guidelines: A complete evaluation includes B-mode imaging, spectral Doppler, color Doppler, and power Doppler as needed of all accessible portions of each vessel. Bilateral testing is considered an integral part of a complete examination. Limited examinations for reoccurring indications may be performed as noted.  Right Carotid Findings: +----------+--------+--------+--------+------------------+--------------+           PSV cm/sEDV cm/sStenosisPlaque DescriptionComments       +----------+--------+--------+--------+------------------+--------------+ CCA Prox  54      9               heterogenous                     +----------+--------+--------+--------+------------------+--------------+ CCA Distal65      16              heterogenous                     +----------+--------+--------+--------+------------------+--------------+ ICA Prox  48      17      1-39%   heterogenous                     +----------+--------+--------+--------+------------------+--------------+  ICA Distal                                          Not visualized +----------+--------+--------+--------+------------------+--------------+ ECA       91      9                                                +----------+--------+--------+--------+------------------+--------------+  +----------+--------+-------+--------------------------+-------------------+           PSV cm/sEDV cmsDescribe                  Arm Pressure (mmHG) +----------+--------+-------+--------------------------+-------------------+ Subclavian               not visualized due to port                    +----------+--------+-------+--------------------------+-------------------+ +---------+--------+--------+--------------+ VertebralPSV cm/sEDV cm/sNot identified +---------+--------+--------+--------------+  Left Carotid Findings: +----------+--------+--------+--------+------------------+--------------+           PSV cm/sEDV cm/sStenosisPlaque DescriptionComments       +----------+--------+--------+--------+------------------+--------------+ CCA Prox  53      5               heterogenous                     +----------+--------+--------+--------+------------------+--------------+ CCA Distal73      10              heterogenous                     +----------+--------+--------+--------+------------------+--------------+ ICA Prox  127     16      1-39%   heterogenous                     +----------+--------+--------+--------+------------------+--------------+ ICA Distal                                          Not visualized +----------+--------+--------+--------+------------------+--------------+ ECA       220                                                      +----------+--------+--------+--------+------------------+--------------+ +----------+--------+--------+--------------+-------------------+           PSV cm/sEDV cm/sDescribe      Arm Pressure (mmHG) +----------+--------+--------+--------------+-------------------+ Subclavian                Not identified                    +----------+--------+--------+--------------+-------------------+ +---------+--------+--------+--------------+ VertebralPSV cm/sEDV cm/sNot identified  +---------+--------+--------+--------------+   Summary: Right Carotid: Velocities in the right ICA are consistent with a 1-39% stenosis. Left Carotid: Velocities in the left ICA are consistent with a 1-39% stenosis. Vertebrals: Bilateral vertebral arteries were not visualized. *See table(s) above for measurements and observations.  Electronically signed by Antony Contras MD on 07/14/2020 at 1:04:27 PM.    Final

## 2020-07-24 NOTE — Progress Notes (Signed)
ANTICOAGULATION CONSULT NOTE - Follow Up Consult  Pharmacy Consult for heparin > apixaban Indication: atrial fibrillation in setting of ischemic infarct with hemorrhagic conversion and GI bleed  Labs: Recent Labs    07/22/20 0240 07/23/20 0658  HGB 10.1* 10.3*  HCT 32.2* 32.5*  PLT 181 216  CREATININE  --  5.99*    Assessment/Plan:  79yo female therapeutic on heparin after rate change. Will continue gtt at current rate and confirm stable with additional level.   Pt has been on apixaban 2.5mg  BID for her afib and CVA. Reduced dose was used due to hemorrhagic conversion and GIB.  Plan  Cont apixaban 2.5mg  BID Rx will follow peripherally  [name], PharmD, BCIDP, AAHIVP, CPP Infectious Disease Pharmacist 07/24/2020 1:37 PM

## 2020-07-24 NOTE — Progress Notes (Signed)
Physical Therapy Treatment Patient Details Name: [name] MRN: [id number] DOB: October 06, 1940 Today's Date: 07/24/2020    History of Present Illness Pt is a 79 y.o. female with initial COVID dx on 07/07/20 and underwent antibody infusion, admitted 07/12/20 as code stroke with L-side facial droop and flaccid paralysis. Workup revealed 7 mm hemorrhage in R basal ganglia; not a tPA or IR candidate. Pt also with new onset PAF, GIB. S/p EGD 12/23. PMH includes CA, DM, ESRD (HD MWF).    PT Comments    Patient continues to require cues for safe use of RW. Introduced side stepping with RW through narrow spaces and pt with great difficulty coordinating how to move RW, where to stand, and when to step. Overall improving.     Follow Up Recommendations  SNF     Equipment Recommendations  Rolling walker with 5" wheels    Recommendations for Other Services       Precautions / Restrictions Precautions Precautions: Fall Precaution Comments: watch for orthostatic    Mobility  Bed Mobility               General bed mobility comments: pt sitting EOB on arrival without bed alarm on  Transfers Overall transfer level: Needs assistance Equipment used: Rolling walker (2 wheeled) Transfers: Sit to/from Stand Sit to Stand: Min guard         General transfer comment: no cues for hand placement  Ambulation/Gait Ambulation/Gait assistance: Min assist Gait Distance (Feet): 20 Feet (50, 40 ft) Assistive device: Rolling walker (2 wheeled) Gait Pattern/deviations: Narrow base of support;Step-through pattern;Decreased step length - right;Decreased step length - left;Drifts right/left Gait velocity: Decreased   General Gait Details: vc for proximity to RW and upright posture   Stairs             Wheelchair Mobility    Modified Rankin (Stroke Patients Only) Modified Rankin (Stroke Patients Only) Pre-Morbid Rankin Score: No significant disability Modified Rankin: Moderately  severe disability     Balance Overall balance assessment: Needs assistance Sitting-balance support: No upper extremity supported;Feet supported Sitting balance-Leahy Scale: Fair     Standing balance support: Bilateral upper extremity supported;During functional activity Standing balance-Leahy Scale: Poor                              Cognition Arousal/Alertness: Awake/alert Behavior During Therapy: Flat affect Overall Cognitive Status: Impaired/Different from baseline Area of Impairment: Attention;Memory;Following commands;Safety/judgement;Awareness;Problem solving                   Current Attention Level: Selective Memory: Decreased short-term memory Following Commands: Follows multi-step commands consistently;Follows multi-step commands with increased time Safety/Judgement: Decreased awareness of safety;Decreased awareness of deficits Awareness: Emergent Problem Solving: Slow processing;Requires verbal cues;Difficulty sequencing        Exercises      General Comments General comments (skin integrity, edema, etc.): Assisted to bathroom and off toilet with min assist      Pertinent Vitals/Pain Pain Assessment: No/denies pain Faces Pain Scale: No hurt    Home Living                      Prior Function            PT Goals (current goals can now be found in the care plan section) Acute Rehab PT Goals Patient Stated Goal: to "get back to living" Time For Goal Achievement: 07/30/20 Potential to Achieve Goals: Good  Progress towards PT goals: Progressing toward goals    Frequency    Min 3X/week      PT Plan Current plan remains appropriate;Frequency needs to be updated    Co-evaluation              AM-PAC PT "6 Clicks" Mobility   Outcome Measure  Help needed turning from your back to your side while in a flat bed without using bedrails?: A Little Help needed moving from lying on your back to sitting on the side of a flat  bed without using bedrails?: A Little Help needed moving to and from a bed to a chair (including a wheelchair)?: A Little Help needed standing up from a chair using your arms (e.g., wheelchair or bedside chair)?: A Little Help needed to walk in hospital room?: A Little Help needed climbing 3-5 steps with a railing? : A Lot 6 Click Score: 17    End of Session Equipment Utilized During Treatment: Gait belt Activity Tolerance: Patient tolerated treatment well Patient left: in chair;with call bell/phone within reach;with chair alarm set Nurse Communication: Mobility status PT Visit Diagnosis: Other abnormalities of gait and mobility (R26.89);Other symptoms and signs involving the nervous system (R29.898)     Time: 4239-5320 PT Time Calculation (min) (ACUTE ONLY): 35 min  Charges:  $Gait Training: 23-37 mins                      Arby Barrette, PT Pager [phone]    Rexanne Mano 07/24/2020, 4:13 PM

## 2020-07-24 NOTE — Progress Notes (Addendum)
Renal Navigator spoke with Medical Director/Dr. Johnney Ou at patient's outpatient HD clinic to see if should agree to take patient back to regular shift on patient's 21st day of isolation (tested positive for COVID on 07/07/20) instead of waiting until the day after 21 days of isolation. She is in agreement as is [employer]. This would allow patient to be able to discharge after HD in isolation tomorrow and treat in a regular shift on Monday if a SNF in [place] could accommodate her sooner than next week. [facility] has already stated that they will switch patient's schedule from TTS to MWF to accommodate discharge to SNF. They are accustomed to speaking directly with the SNFs in  to work out seat times and transportation.  Navigator received message from Nephrologist that patient was uncertain about her disposition plan and seemingly anxious. Navigator asked TOC CSW to provide update on barriers to discharge and plan moving forward to patient/family.   Alphonzo Cruise,  Renal Navigator [phone]

## 2020-07-24 NOTE — Progress Notes (Signed)
[facility] Progress Note   Subjective:  Pt seen in room, wondering why the delay of discharge. Per renal SW, there are no COVID SNF spots in [place], and can't go to [facility]F since there are no HD chairs in [place] when her isolation is up. so plan is to keep her here until off isolation and send to SNF in [place] where her HD unit is.   Objective Vitals:   07/23/20 2359 07/24/20 0454 07/24/20 0750 07/24/20 1151  BP: 138/73 (!) 159/73 (!) 156/76 (!) 141/73  Pulse: 78 72 72 82  Resp: 19 20 20 20   Temp: 98.5 F (36.9 C) 98.5 F (36.9 C) 98.5 F (36.9 C) 98 F (36.7 C)  TempSrc: Oral Oral Oral Oral  SpO2: 99% 98% 96% 99%  Weight: 74.6 kg 74.6 kg    Height:       Physical Exam General:  alert, nad   no jvd  Chest cta bilat  Cor reg no RG  Abd soft ntnd no ascites   Ext no LE edema   Alert, NF, ox3   RIJ TDC  Additional Objective Labs: Basic Metabolic Panel: Recent Labs  Lab 07/19/20 0200 07/21/20 0503 07/23/20 0658  NA 135 133* 132*  K 4.0 4.5 4.4  CL 96* 97* 95*  CO2 23 24 23   GLUCOSE 133* 158* 182*  BUN 34* 27* 43*  CREATININE 6.30* 5.38* 5.99*  CALCIUM 8.7* 8.6* 8.9  PHOS  --   --  5.6*   Liver Function Tests: Recent Labs  Lab 07/18/20 0050 07/19/20 0200 07/23/20 0658  AST 49* 45*  --   ALT 28 31  --   ALKPHOS 79 80  --   BILITOT 0.9 0.9  --   PROT 6.4* 6.6  --   ALBUMIN 2.8* 2.7* 3.0*   CBC: Recent Labs  Lab 07/19/20 0200 07/20/20 0524 07/21/20 0503 07/22/20 0240 07/23/20 0658  WBC 5.1 5.0 5.0 5.5 6.5  NEUTROABS 2.9 3.0 2.9 3.6  --   HGB 11.5* 11.0* 10.3* 10.1* 10.3*  HCT 36.3 34.7* 33.4* 32.2* 32.5*  MCV 90.8 90.6 91.5 91.2 90.5  PLT 157 176 194 181 216   Medications: . sodium chloride Stopped (07/17/20 1515)   . amiodarone  200 mg Oral Daily  . apixaban  2.5 mg Oral BID  . aspirin EC  81 mg Oral Daily  . atorvastatin  20 mg Oral QHS  . chlorhexidine  15 mL Mouth Rinse BID  . Chlorhexidine Gluconate Cloth  6 each  Topical Daily  . diltiazem  120 mg Oral Daily  . hydrALAZINE  25 mg Oral Q8H  . insulin aspart  0-6 Units Subcutaneous Q4H  . insulin glargine  10 Units Subcutaneous Once  . [START ON 07/25/2020] insulin glargine  30 Units Subcutaneous Daily  . mouth rinse  15 mL Mouth Rinse q12n4p  . midodrine  10 mg Oral Once  . pantoprazole  40 mg Oral BID    Dialysis Orders: MWF [facility] 3.5h   400/800 79kg 3K/2.25 bath P2 Hep 3500 RIJ TDC/ no AVF - calcitriol 0.5 ug tiw  Assessment/Plan: 1.  COVID-19 infection: Dx 12/13, s/p monoclonal antibody treatment on 12/17.  She was unvaccinated. Improving clinically. Off isolation from [facility] standpoint, will remain on isolation during dialysis until 07/28/2020. 2.  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 Some residual right hand weakness noted. 3.  ESRD/ volume: Continue HD per usual MWF schedule - HD Friday. 4 kg under dry wt,BP's good.  4.  GI bleed with acute blood loss anemia: Hgb 10.3.  S/p EGD 12/23 showing gastritis and duodenitis without any focal bleeding.  Ongoing medical management with PPI. 5. CKD-MBD: Ca ok, Phos slightly high. Resume binders/VDRA. 6. Nutrition: Resumed renal diet with fluid restriction 7.  Paroxysmal atrial fibrillation with rapid ventricular response: Back in sinus rhythm/rate controlled and now back on Eliquis following transient heparin drip.   [name], MD 07/24/2020, 1:50 PM

## 2020-07-24 NOTE — Discharge Instructions (Addendum)
Follow with Primary MD [name], MD in 7 days   Get CBC, CMP, 2 view Chest X ray - checked next visit within 1 week by Primary MD or SNF MD.  Activity: As tolerated with Full fall precautions use walker/cane & assistance as needed  Disposition SNF  Diet: Soft diet low carbohydrate diet with 1.5 L restriction per day.  CBGs - QAC-HS  Special Instructions: If you have smoked or chewed Tobacco  in the last 2 yrs please stop smoking, stop any regular Alcohol  and or any Recreational drug use.  On your next visit with your primary care physician please Get Medicines reviewed and adjusted.  Please request your Primary MD to go over all Hospital Tests and Procedure/Radiological results at the follow up, please get all Hospital records sent to your Primary MD by signing hospital release before you go home.  If you experience worsening of your admission symptoms, develop shortness of breath, life threatening emergency, suicidal or homicidal thoughts you must seek medical attention immediately by calling 911 or calling your MD immediately if symptoms less severe.  You must read complete instructions/literature along with all the possible adverse reactions/side effects for all the Medicines you take and that have been prescribed to you. Take any new Medicines after you have completely understood and accept all the possible adverse reactions/side effects.    Information on my medicine - ELIQUIS (apixaban)  This medication education was reviewed with me or my healthcare representative as part of my discharge preparation.    Why was Eliquis prescribed for you? Eliquis was prescribed for you to reduce the risk of a blood clot forming that can cause a stroke if you have a medical condition called atrial fibrillation (a type of irregular heartbeat).  What do You need to know about Eliquis ? Take your Eliquis TWICE DAILY - one tablet in the morning and one tablet in the evening with or without  food. If you have difficulty swallowing the tablet whole please discuss with your pharmacist how to take the medication safely.  Take Eliquis exactly as prescribed by your doctor and DO NOT stop taking Eliquis without talking to the doctor who prescribed the medication.  Stopping may increase your risk of developing a stroke.  Refill your prescription before you run out.  After discharge, you should have regular check-up appointments with your healthcare provider that is prescribing your Eliquis.  In the future your dose may need to be changed if your kidney function or weight changes by a significant amount or as you get older.  What do you do if you miss a dose? If you miss a dose, take it as soon as you remember on the same day and resume taking twice daily.  Do not take more than one dose of ELIQUIS at the same time to make up a missed dose.  Important Safety Information A possible side effect of Eliquis is bleeding. You should call your healthcare provider right away if you experience any of the following: ? Bleeding from an injury or your nose that does not stop. ? Unusual colored urine (red or dark brown) or unusual colored stools (red or black). ? Unusual bruising for unknown reasons. ? A serious fall or if you hit your head (even if there is no bleeding).  Some medicines may interact with Eliquis and might increase your risk of bleeding or clotting while on Eliquis. To help avoid this, consult your healthcare provider or pharmacist prior to using  any new prescription or non-prescription medications, including herbals, vitamins, non-steroidal anti-inflammatory drugs (NSAIDs) and supplements.  This website has more information on Eliquis (apixaban): http://www.eliquis.com/eliquis/home

## 2020-07-24 NOTE — TOC Progression Note (Addendum)
Transition of Care [facility]) - Progression Note    Patient Details  Name: [name] MRN: [id number] Date of Birth: 06/28/1941  Transition of Care [facility] CM/SW [place], LCSW Phone Number: 07/24/2020, 9:50 AM  Clinical Narrative:    9:50am-CSW to follow up with [facility] to see if they would be willing to accept patient on Monday. Per Navi, patient is managed by [facility] and the SNF will need to obtain authorization.  2:15pm-CSW updated patient and her granddaughter, [name], on current DC plan. CSW spoke with Maudie Mercury at [facility] to see if Josem Kaufmann could be started tomorrow in preparation for Monday. She reported that they would not be able to start auth until Monday, but that they should be able to receive it same day.      Barriers to Discharge: Continued Medical Work up,SNF Pending bed offer,Insurance Authorization  Expected Discharge Plan and Services   In-house Referral: Clinical Social Work     Living arrangements for the past 2 months: Single Family Home Expected Discharge Date: 07/22/20                                     Social Determinants of Health (SDOH) Interventions    Readmission Risk Interventions No flowsheet data found.

## 2020-07-25 DIAGNOSIS — U071 COVID-19: Secondary | ICD-10-CM | POA: Diagnosis not present

## 2020-07-25 DIAGNOSIS — Z992 Dependence on renal dialysis: Secondary | ICD-10-CM | POA: Diagnosis not present

## 2020-07-25 DIAGNOSIS — N186 End stage renal disease: Secondary | ICD-10-CM | POA: Diagnosis not present

## 2020-07-25 DIAGNOSIS — I4891 Unspecified atrial fibrillation: Secondary | ICD-10-CM | POA: Diagnosis not present

## 2020-07-25 DIAGNOSIS — E1122 Type 2 diabetes mellitus with diabetic chronic kidney disease: Secondary | ICD-10-CM | POA: Diagnosis not present

## 2020-07-25 LAB — RENAL FUNCTION PANEL
Albumin: 2.8 g/dL — ABNORMAL LOW (ref 3.5–5.0)
Anion gap: 14 (ref 5–15)
BUN: 44 mg/dL — ABNORMAL HIGH (ref 8–23)
CO2: 24 mmol/L (ref 22–32)
Calcium: 8.7 mg/dL — ABNORMAL LOW (ref 8.9–10.3)
Chloride: 95 mmol/L — ABNORMAL LOW (ref 98–111)
Creatinine, Ser: 5.88 mg/dL — ABNORMAL HIGH (ref 0.44–1.00)
GFR, Estimated: 7 mL/min — ABNORMAL LOW (ref >60)
Glucose, Bld: 145 mg/dL — ABNORMAL HIGH (ref 70–99)
Phosphorus: 5.2 mg/dL — ABNORMAL HIGH (ref 2.5–4.6)
Potassium: 4.1 mmol/L (ref 3.5–5.1)
Sodium: 133 mmol/L — ABNORMAL LOW (ref 135–145)

## 2020-07-25 LAB — CBC
HCT: 29.7 % — ABNORMAL LOW (ref 36.0–46.0)
Hemoglobin: 9.2 g/dL — ABNORMAL LOW (ref 12.0–15.0)
MCH: 28.7 pg (ref 26.0–34.0)
MCHC: 31 g/dL (ref 30.0–36.0)
MCV: 92.5 fL (ref 80.0–100.0)
Platelets: 203 10*3/uL (ref 150–400)
RBC: 3.21 MIL/uL — ABNORMAL LOW (ref 3.87–5.11)
RDW: 16.6 % — ABNORMAL HIGH (ref 11.5–15.5)
WBC: 4.6 10*3/uL (ref 4.0–10.5)
nRBC: 0 % (ref 0.0–0.2)

## 2020-07-25 LAB — GLUCOSE, CAPILLARY
Glucose-Capillary: 173 mg/dL — ABNORMAL HIGH (ref 70–99)
Glucose-Capillary: 177 mg/dL — ABNORMAL HIGH (ref 70–99)
Glucose-Capillary: 161 mg/dL — ABNORMAL HIGH (ref 70–99)
Glucose-Capillary: 143 mg/dL — ABNORMAL HIGH (ref 70–99)
Glucose-Capillary: 218 mg/dL — ABNORMAL HIGH (ref 70–99)
Glucose-Capillary: 213 mg/dL — ABNORMAL HIGH (ref 70–99)

## 2020-07-25 NOTE — Progress Notes (Signed)
PROGRESS NOTE                                                                                                                                                                                                             Patient Demographics:    [name], is a 79 y.o. female, DOB - 05-17-1941, HCW:[id number]  Outpatient Primary MD for the patient is [name], MD    LOS - 13  Admit date - 07/12/2020    Chief Complaint  Patient presents with  . Code Stroke  . GI Bleeding       Brief Narrative (HPI from H&P) - this is a 79 year old female with history of hypertension, lateral hip fractures, right-sided breast cancer s/p treatment including lumpectomy about 5 years ago, ESRD, dyslipidemia, DM type II, A. fib who was brought in the hospital on 07/12/2020 with altered mental status, was found to have ischemic stroke with hemorrhagic conversion, her stay was complicated by GI bleed and COVID-19 infection.  She was initially admitted under PCCM and was transferred to hospitalist service on 07/15/2020.  She has been seen so far by nephrology, neurology and PCCM.   Subjective:   Patient seen and examined.  Sitting at the edge of the bed.  She has no complaints.  No oxygen requirement, reports some cough, but denies any shortness of breath, chest pain or fever.   Assessment  & Plan :    Acute right basal ganglia hemorrhagic stroke- with minimal left-sided weakness. - this appears to be ischemic infarct with hemorrhagic conversion due to A. Fib.  Seen by stroke team, case discussed with Dr. Leonie Man on 07/15/2020.  For now baby aspirin along with home dose statin.  LDL was within acceptable limits, echocardiogram nonacute, A1c slightly elevated will defer for PCP for better glycemic control.  Continue PT-OT and speech therapy.  Now placed on Eliquis along with home dose statin for secondary prevention,previous MD  discussed with Dr.  Leonie Man no new recommendations, will be discharged to SNF.   A. fib RVR.  Likely found this admission.  She is currently in and out of A. fib and RVR, have started her on Cardizem, amiodarone added by cardiology, stable TSH & echocardiogram noted with preserved EF.    On Eliquis now outpatient cardiology follow-up in 1 to 2 weeks post discharge.  Acute GI bleed with blood loss related anemia.  Appears to be upper GI.  S/p packed RBC transfusion on 07/12/2020 1 unit, monitor H&H, PPI, GI on board and she underwent EGD on 07/17/2020, discussed the case with Dr. Nicki Guadalajara and Dr. Leonie Man neurology, okay for heparin drip which was started on 07/18/2020 along with twice daily PPI.  No further bleeding and switched to Eliquis on 07/19/2020.  H&H relatively stable.  ESRD.  On MWF schedule.    Neurology following continue outpatient regimen post discharge.  Dyslipidemia.  On statin.   Essential hypertension.  Very well controlled.  Continue diltiazem and hydralazine as well as as needed metoprolol.     Mild metabolic encephalopathy.  She is completely alert and oriented at this point in time.   COVID-19 infection diagnosed on 07/07/2020.  S/p antibody infusion.  Monitor.     History of right-sided breast cancer.  Outpatient follow-up with oncology doses 80-year-old problem.     Hip pain.  Chronic.  CT scan stable.  Monitor with supportive care.  PT OT.    Stable.   DM type II.  Hemoglobin A1c 7.9 on 07/13/2020.  On glipizide at home which is on hold.  Blood sugar still elevated despite of being on 20 units of Lantus.  Will increase to 30 units.  She only received 20 units so I will add 10 more today and starting tomorrow, she will be on 30 units.  Continue SSI.  Lab Results  Component Value Date   HGBA1C 7.9 (H) 07/13/2020    CBG (last 3)  Recent Labs    07/25/20 0431 07/25/20 0813 07/25/20 1304  GLUCAP 177* 161* 143*    Lab Results  Component Value Date   CHOL 99  07/14/2020   HDL 31 (L) 07/14/2020   LDLCALC 32 07/14/2020   TRIG 178 (H) 07/14/2020   CHOLHDL 3.2 07/14/2020    Lab Results  Component Value Date   HGBA1C 7.9 (H) 07/13/2020      Lab Results  Component Value Date   TSH 1.219 07/16/2020        Condition -stable  Family Communication  : none.  Patient alert and oriented.  Code Status :  Full  Consults  : Neurology, PCCM, nephrology, cardiology  Procedures  :    EGD 12/23 - gastritis and duodenitis.  CT HEAD  07/12/2020  -   Acute 7 mm hemorrhage within the right basal ganglia. No substantial mass effect. Chronic microvascular ischemic disease and remote right cerebellar lacunar infarct.   CT HEAD WO CONTRAST - 07/13/2020 expected evolutionary changes in the acute right basal ganglia hemorrhage overall similar in size with slightly decreased density compared to previous CT.  No new abnormality.  MRI / MRA Head - unchanged 1 cm right posterior lentiform nucleus/internal capsule hemorrhage without significant surrounding edema or mass-effect.  4 mm diffusion abnormality involving subcortical white matter in the right parietal corona radiata consistent with acute small vessel type infarct.    MRA of the brain technically limited due to motion artifact but no large vessel occlusion.  Focal high-grade stenosis of the right distal M1 segment and distal left M1 segment bifurcation.  Severe multifocal bilateral P2 and distal left P3 stenosis.  Transthoracic Echocardiogram - left ventricular ejection fraction 60 to 65%.  00/00/2021  Bilateral Carotid Dopplers - bilateral 1-39% stenosis of carotids.    PUD Prophylaxis : PPI  Disposition Plan  :    Status is: Inpatient  Remains inpatient appropriate  because:IV treatments appropriate due to intensity of illness or inability to take PO   Dispo: The patient is from: Home              Anticipated d/c is to: SNF              Anticipated d/c date is: > 3 days               Patient currently is medically stable to d/c.   DVT Prophylaxis  :   SCDs >> Heparin   Lab Results  Component Value Date   PLT 203 07/25/2020    Diet :  Diet Order            DIET SOFT Room service appropriate? Yes; Fluid consistency: Thin  Diet effective now                  Inpatient Medications  Scheduled Meds: . amiodarone  200 mg Oral Daily  . apixaban  2.5 mg Oral BID  . aspirin EC  81 mg Oral Daily  . atorvastatin  20 mg Oral QHS  . chlorhexidine  15 mL Mouth Rinse BID  . Chlorhexidine Gluconate Cloth  6 each Topical Daily  . diltiazem  120 mg Oral Daily  . hydrALAZINE  25 mg Oral Q8H  . insulin aspart  0-6 Units Subcutaneous Q4H  . insulin glargine  30 Units Subcutaneous Daily  . mouth rinse  15 mL Mouth Rinse q12n4p  . midodrine  10 mg Oral Once  . pantoprazole  40 mg Oral BID   Continuous Infusions: . sodium chloride Stopped (07/17/20 1515)   PRN Meds:.sodium chloride, docusate sodium, guaiFENesin-dextromethorphan, heparin sodium (porcine), metoprolol tartrate, ondansetron (ZOFRAN) IV, polyethylene glycol, sodium chloride flush  Antibiotics  :    Anti-infectives (From admission, onward)   None      Phillips Climes M.D on 07/25/2020 at 3:35 PM  To page go to [email or web address]   [facility] -  Office  [phone]   See all Orders from today for further details    Objective:   Vitals:   07/25/20 1130 07/25/20 1200 07/25/20 1223 07/25/20 1306  BP: 133/65 129/71 127/64 (!) 164/73  Pulse: 66 68 72 73  Resp:    18  Temp:   (!) 97.4 F (36.3 C) 97.9 F (36.6 C)  TempSrc:   Oral Oral  SpO2:      Weight:   75.4 kg   Height:        Wt Readings from Last 3 Encounters:  07/25/20 75.4 kg  04/21/20 77.9 kg  04/20/19 90.2 kg     Intake/Output Summary (Last 24 hours) at 07/25/2020 1535 Last data filed at 07/25/2020 1215 Gross per 24 hour  Intake 0 ml  Output 1750 ml  Net -1750 ml     Physical Exam  Awake Alert, Oriented X 3, No  new F.N deficits, Normal affect Symmetrical Chest wall movement, Good air movement bilaterally, CTAB RRR,No Gallops,Rubs or new Murmurs, No Parasternal Heave +ve B.Sounds, Abd Soft, No tenderness, No rebound - guarding or rigidity. No Cyanosis, Clubbing or edema, No new Rash or bruise       Data Review:    CBC Recent Labs  Lab 07/19/20 0200 07/20/20 0524 07/21/20 0503 07/22/20 0240 07/23/20 0658 07/25/20 0924  WBC 5.1 5.0 5.0 5.5 6.5 4.6  HGB 11.5* 11.0* 10.3* 10.1* 10.3* 9.2*  HCT 36.3 34.7* 33.4* 32.2* 32.5* 29.7*  PLT 157 176 194 181 216 203  MCV 90.8 90.6 91.5 91.2 90.5 92.5  MCH 28.8 28.7 28.2 28.6 28.7 28.7  MCHC 31.7 31.7 30.8 31.4 31.7 31.0  RDW 17.2* 17.0* 17.0* 16.8* 16.8* 16.6*  LYMPHSABS 0.9 0.9 0.9 0.9  --   --   MONOABS 1.1* 0.9 0.9 0.8  --   --   EOSABS 0.1 0.1 0.1 0.1  --   --   BASOSABS 0.0 0.0 0.0 0.0  --   --     Recent Labs  Lab 07/19/20 0200 07/21/20 0503 07/23/20 0658 07/25/20 0924  NA 135 133* 132* 133*  K 4.0 4.5 4.4 4.1  CL 96* 97* 95* 95*  CO2 23 24 23 24   GLUCOSE 133* 158* 182* 145*  BUN 34* 27* 43* 44*  CREATININE 6.30* 5.38* 5.99* 5.88*  CALCIUM 8.7* 8.6* 8.9 8.7*  AST 45*  --   --   --   ALT 31  --   --   --   ALKPHOS 80  --   --   --   BILITOT 0.9  --   --   --   ALBUMIN 2.7*  --  3.0* 2.8*  MG 1.8  --   --   --   CRP 8.4*  --   --   --   DDIMER 1.47*  --   --   --   BNP 113.8*  --   --   --     ------------------------------------------------------------------------------------------------------------------ No results for input(s): CHOL, HDL, LDLCALC, TRIG, CHOLHDL, LDLDIRECT in the last 72 hours.  Lab Results  Component Value Date   HGBA1C 7.9 (H) 07/13/2020   ------------------------------------------------------------------------------------------------------------------ No results for input(s): TSH, T4TOTAL, T3FREE, THYROIDAB in the last 72 hours.  Invalid input(s): FREET3  Cardiac Enzymes No results for  input(s): CKMB, TROPONINI, MYOGLOBIN in the last 168 hours.  Invalid input(s): CK ------------------------------------------------------------------------------------------------------------------    Component Value Date/Time   BNP 113.8 (H) 07/19/2020 0200    Micro Results Recent Results (from the past 240 hour(s))  SARS CORONAVIRUS 2 (TAT 6-24 HRS) Nasopharyngeal Nasopharyngeal Swab     Status: Abnormal   Collection Time: 07/21/20  1:34 PM   Specimen: Nasopharyngeal Swab  Result Value Ref Range Status   SARS Coronavirus 2 POSITIVE (A) NEGATIVE Final    Comment: RESULT CALLED TO, READ BACK BY AND VERIFIED WITH: F.AFOR,RN @2142  07/21/2020 VANG.J (NOTE) SARS-CoV-2 target nucleic acids are DETECTED.  The SARS-CoV-2 RNA is generally detectable in upper and lower respiratory specimens during the acute phase of infection. Positive results are indicative of the presence of SARS-CoV-2 RNA. Clinical correlation with patient history and other diagnostic information is  necessary to determine patient infection status. Positive results do not rule out bacterial infection or co-infection with other viruses.  The expected result is Negative.  Fact Sheet for Patients: SugarRoll.be  Fact Sheet for Healthcare Providers: https://www.woods-mathews.com/  This test is not yet approved or cleared by the [place] FDA and  has been authorized for detection and/or diagnosis of SARS-CoV-2 by FDA under an Emergency Use Authorization (EUA). This EUA will remain  in effect (meaning this test can be used) f or the duration of the COVID-19 declaration under Section 564(b)(1) of the Act, 21 U.S.C. section 360bbb-3(b)(1), unless the authorization is terminated or revoked sooner.   Performed at [facility], [place] [address], [place], [place] [address]     Radiology Reports DG Chest 1 View  Result Date: 07/12/2020 CLINICAL DATA:  Patient with  history of COVID-19.  History of GI bleed. EXAM: CHEST  1 VIEW COMPARISON:  Chest radiograph 07/10/2020. FINDINGS: Stable right-sided dialysis catheter. Monitoring leads overlie the patient. Stable cardiomegaly. Aortic atherosclerosis. Site oval increased patchy opacities left mid lower lung. No definite pleural effusion or pneumothorax. Thoracic spine degenerative changes. Surgical clips right axilla. IMPRESSION: Increasing patchy opacities left mid and lower lung may represent infection in the appropriate clinical setting or potentially atelectasis. Electronically Signed   By: Lovey Newcomer M.D.   On: 07/12/2020 11:24   CT HEAD WO CONTRAST  Result Date: 07/16/2020 CLINICAL DATA:  Stroke follow-up. EXAM: CT HEAD WITHOUT CONTRAST TECHNIQUE: Contiguous axial images were obtained from the base of the skull through the vertex without intravenous contrast. COMPARISON:  07/13/2020 head CT and MRI FINDINGS: Brain: Approximately 1 cm hyperdense focus at the posterior aspect of the right lentiform nucleus corresponding to the previously demonstrated hemorrhage is unchanged in size. There is no associated edema. No new intracranial hemorrhage, acute large territory infarct, mass, midline shift, or extra-axial fluid collection is identified. Mild cerebral atrophy is within normal limits for age. Hypodensities in the cerebral white matter bilaterally are unchanged and nonspecific but compatible with mild to moderate chronic small vessel ischemic disease. Vascular: Calcified atherosclerosis at the skull base. Skull: No fracture or suspicious osseous lesion. Sinuses/Orbits: Moderate mucosal thickening in the frontal sinuses. Small right mastoid effusion. Bilateral cataract extraction. Other: None. IMPRESSION: 1. Unchanged 1 cm right basal ganglia hemorrhage. 2. No new intracranial abnormality. Electronically Signed   By: Logan Bores M.D.   On: 07/16/2020 15:15   CT HEAD WO CONTRAST  Result Date: 07/13/2020 CLINICAL DATA:   Follow-up examination of basal ganglia hemorrhage. EXAM: CT HEAD WITHOUT CONTRAST TECHNIQUE: Contiguous axial images were obtained from the base of the skull through the vertex without intravenous contrast. COMPARISON:  Prior CT from 07/12/2020 FINDINGS: Brain: Previously identified intraparenchymal hemorrhage at the posterior right lentiform nucleus again seen. Bleed has mildly dispersed and is less dense in appearance as compared to previous exam, measuring overall similar in size at 10 x 7 x 10 mm, previously 10 x 5 x 10 mm when measured in a similar fashion. No significant surrounding edema or regional mass effect. No other acute or interval intracranial hemorrhage. No other acute large vessel territory infarct. No mass lesion, mass effect, or midline shift. No hydrocephalus or extra-axial fluid collection. Atrophy with chronic microvascular ischemic disease again noted. Vascular: No hyperdense vessel. Calcified atherosclerosis at the skull base. Skull: Scalp soft tissues and calvarium within normal limits. Sinuses/Orbits: Globes and orbital soft tissues demonstrate no acute finding. Mild mucoperiosteal thickening noted throughout the visualized paranasal sinuses. Mastoid air cells remain clear. Other: None. IMPRESSION: 1. Normal expected interval evolution of acute intraparenchymal hemorrhage positioned at the posterior right lentiform nucleus, overall similar in size but with slightly decreased density as compared to previous. No significant surrounding edema or regional mass effect. 2. No other new acute intracranial abnormality. 3. Atrophy with chronic microvascular ischemic disease. Electronically Signed   By: Jeannine Boga M.D.   On: 07/13/2020 18:45   CT PELVIS WO CONTRAST  Result Date: 07/14/2020 CLINICAL DATA:  Right hip pain EXAM: CT PELVIS WITHOUT CONTRAST TECHNIQUE: Multidetector CT imaging of the pelvis was performed following the standard protocol without intravenous contrast.  COMPARISON:  Radiographs 07/12/2020 FINDINGS: Urinary Tract: Bladder and distal ureters obscured by streak artifact from the patient's bilateral hip hardware. Bowel:  Unremarkable, rectum partially obscured by streak artifact. Vascular/Lymphatic: Aortoiliac atherosclerotic vascular disease. No  pathologic adenopathy identified. Reproductive:  Unremarkable Other:  No supplemental non-categorized findings. Musculoskeletal: Right hip screw with IM nail. Single distal interlocking screw along the IM nail. Inter trochanteric deformity from old fracture, no acute fracture is identified. Severe degenerative right hip arthropathy with loss of articular space and spurring. Left hip hemiarthroplasty without discrete complicating feature identified. Mild spurring along the SI joints. There is lower lumbar spondylosis and degenerative disc disease leading to moderate left foraminal stenosis at L5-S1. IMPRESSION: 1. Severe degenerative right hip arthropathy. 2. Right hip screw with IM nail. Right intertrochanteric deformity from old fracture. No acute fracture is identified. 3. Left hip hemiarthroplasty without discrete complicating feature identified. 4. Lower lumbar spondylosis and degenerative disc disease leading to moderate left foraminal stenosis at L5-S1. 5. Aortic atherosclerosis. Aortic Atherosclerosis (ICD10-I70.0). Electronically Signed   By: Van Clines M.D.   On: 07/14/2020 14:12   MR ANGIO HEAD WO CONTRAST  Result Date: 07/13/2020 CLINICAL DATA:  Follow-up examination for acute stroke. EXAM: MRI HEAD WITHOUT CONTRAST MRA HEAD WITHOUT CONTRAST TECHNIQUE: Multiplanar, multiecho pulse sequences of the brain and surrounding structures were obtained without intravenous contrast. Angiographic images of the head were obtained using MRA technique without contrast. COMPARISON:  Prior head CT from 07/12/2020 as well as previous brain MRI from 08/17/2016. FINDINGS: MRI HEAD FINDINGS Brain: Diffuse prominence of the  CSF containing spaces compatible with generalized age-related cerebral atrophy. Patchy and confluent T2/FLAIR hyperintensity within the periventricular and deep white matter both cerebral hemispheres most likely related chronic microvascular ischemic disease. Patchy involvement of the pons. Overall, these changes are mildly progressed as compared to 2018. Few small remote lacunar type infarcts noted about the basal ganglia, most notable which present at the right caudothalamic groove. Previously identified hemorrhage centered at the posterior right lentiform nucleus/right internal capsule again seen, stable in size measuring up to approximately 1 cm in greatest dimension. This is somewhat difficult to visualized by MR, and is most notable on T1 weighted sequence (series 16, image 26). No significant surrounding edema or regional mass effect. No other acute intracranial hemorrhage. Few scattered chronic micro hemorrhages noted at the right cerebellum and pons, likely small vessel/hypertensive in nature. Single punctate 4 mm focus of diffusion abnormality involving the subcortical white matter of the right parietal corona radiata noted, likely a small acute to subacute small vessel type ischemic infarct (series 5, image 74). No associated hemorrhage or mass effect. No other diffusion abnormality to suggest acute or subacute ischemia. Gray-white matter differentiation otherwise maintained. No mass lesion, mass effect, or midline shift. No hydrocephalus or extra-axial fluid collection. Pituitary gland suprasellar region normal. Midline structures intact. 1.3 cm ovoid well-circumscribed lesion positioned at the left petrous apex demonstrating heterogeneous T2/FLAIR signal intensity with hyperintense T1 signal intensity noted (series 16, image 14), nonspecific, but could reflect a small cholesterol granuloma versus asymmetric fatty marrow. Finding is relatively stable from previous MRI from 2018, and of doubtful  significance. Vascular: Major intracranial vascular flow voids are maintained. Skull and upper cervical spine: Craniocervical junction within normal limits. Multilevel degenerative spondylosis noted within the visualized upper cervical spine with resultant mild-to-moderate spinal stenosis. Bone marrow signal intensity within normal limits. No scalp soft tissue abnormality. Sinuses/Orbits: Patient status post bilateral ocular lens replacement. Globes and orbital soft tissues demonstrate no acute finding. Mild scattered mucosal thickening noted throughout the paranasal sinuses. Trace bilateral mastoid effusions, of doubtful significance. Visualized nasopharynx within normal limits. Other: None. MRA HEAD FINDINGS ANTERIOR CIRCULATION: Examination degraded by motion artifact. Visualized  distal cervical segments of the internal carotid arteries are patent with symmetric antegrade flow. Petrous segments widely patent. Atheromatous irregularity throughout the carotid siphons with associated mild multifocal narrowing. A1 segments patent bilaterally. Normal anterior communicating artery complex. Anterior cerebral arteries patent to their distal aspects without high-grade stenosis. Short-segment severe stenosis at the distal left M1 segment/left MCA bifurcation (series 1036, image 12). Left MCA branches are diffusely irregular but perfused distally. Right M1 segment patent proximally. Focal signal cutoff at the mid-distal right M1 segment into the bifurcation (series 1036, image 11). This likely reflects a severe high-grade stenosis, as the right MCA branches are perfused distally. Diffuse atheromatous irregularity seen throughout the right MCA branches as well. Proximally. POSTERIOR CIRCULATION: Vertebral arteries largely code dominant and patent to the vertebrobasilar junction. Both PICA origins patent and normal. Basilar patent to its distal aspect without stenosis. Superior cerebral arteries patent bilaterally. Both PCAs  primarily supplied via the basilar. Severe long segment stenosis involving the mid-distal right P2 segment (series 1046, image 15). Severe multifocal distal left P2 and P3 stenoses present as well (series 1046, image 14). PCAs remain perfused to their distal aspects. No intracranial aneurysm or other vascular abnormality. IMPRESSION: MRI HEAD IMPRESSION: 1. Unchanged 1 cm hemorrhage centered at the posterior right lentiform nucleus/right internal capsule. No significant surrounding edema or regional mass effect. 2. 4 mm focus of diffusion abnormality involving the subcortical white matter of the right parietal corona radiata, consistent with a small acute to subacute small vessel type ischemic infarct. No associated hemorrhage. 3. Underlying age-related cerebral atrophy with moderate chronic microvascular ischemic disease, mildly progressed as compared to 2018. MRA HEAD IMPRESSION: 1. Technically limited exam due to motion artifact. 2. Negative intracranial MRA for large vessel occlusion. 3. Focal severe high-grade stenoses involving the mid-distal right M1 segment and distal left M1 segment/left MCA bifurcation. Left MCA branches remain perfused distally. 4. Severe multifocal bilateral P2 and distal left P3 stenoses as above. Electronically Signed   By: Jeannine Boga M.D.   On: 07/13/2020 19:16   MR BRAIN WO CONTRAST  Result Date: 07/13/2020 CLINICAL DATA:  Follow-up examination for acute stroke. EXAM: MRI HEAD WITHOUT CONTRAST MRA HEAD WITHOUT CONTRAST TECHNIQUE: Multiplanar, multiecho pulse sequences of the brain and surrounding structures were obtained without intravenous contrast. Angiographic images of the head were obtained using MRA technique without contrast. COMPARISON:  Prior head CT from 07/12/2020 as well as previous brain MRI from 08/17/2016. FINDINGS: MRI HEAD FINDINGS Brain: Diffuse prominence of the CSF containing spaces compatible with generalized age-related cerebral atrophy. Patchy  and confluent T2/FLAIR hyperintensity within the periventricular and deep white matter both cerebral hemispheres most likely related chronic microvascular ischemic disease. Patchy involvement of the pons. Overall, these changes are mildly progressed as compared to 2018. Few small remote lacunar type infarcts noted about the basal ganglia, most notable which present at the right caudothalamic groove. Previously identified hemorrhage centered at the posterior right lentiform nucleus/right internal capsule again seen, stable in size measuring up to approximately 1 cm in greatest dimension. This is somewhat difficult to visualized by MR, and is most notable on T1 weighted sequence (series 16, image 26). No significant surrounding edema or regional mass effect. No other acute intracranial hemorrhage. Few scattered chronic micro hemorrhages noted at the right cerebellum and pons, likely small vessel/hypertensive in nature. Single punctate 4 mm focus of diffusion abnormality involving the subcortical white matter of the right parietal corona radiata noted, likely a small acute to subacute small vessel type  ischemic infarct (series 5, image 74). No associated hemorrhage or mass effect. No other diffusion abnormality to suggest acute or subacute ischemia. Gray-white matter differentiation otherwise maintained. No mass lesion, mass effect, or midline shift. No hydrocephalus or extra-axial fluid collection. Pituitary gland suprasellar region normal. Midline structures intact. 1.3 cm ovoid well-circumscribed lesion positioned at the left petrous apex demonstrating heterogeneous T2/FLAIR signal intensity with hyperintense T1 signal intensity noted (series 16, image 14), nonspecific, but could reflect a small cholesterol granuloma versus asymmetric fatty marrow. Finding is relatively stable from previous MRI from 2018, and of doubtful significance. Vascular: Major intracranial vascular flow voids are maintained. Skull and upper  cervical spine: Craniocervical junction within normal limits. Multilevel degenerative spondylosis noted within the visualized upper cervical spine with resultant mild-to-moderate spinal stenosis. Bone marrow signal intensity within normal limits. No scalp soft tissue abnormality. Sinuses/Orbits: Patient status post bilateral ocular lens replacement. Globes and orbital soft tissues demonstrate no acute finding. Mild scattered mucosal thickening noted throughout the paranasal sinuses. Trace bilateral mastoid effusions, of doubtful significance. Visualized nasopharynx within normal limits. Other: None. MRA HEAD FINDINGS ANTERIOR CIRCULATION: Examination degraded by motion artifact. Visualized distal cervical segments of the internal carotid arteries are patent with symmetric antegrade flow. Petrous segments widely patent. Atheromatous irregularity throughout the carotid siphons with associated mild multifocal narrowing. A1 segments patent bilaterally. Normal anterior communicating artery complex. Anterior cerebral arteries patent to their distal aspects without high-grade stenosis. Short-segment severe stenosis at the distal left M1 segment/left MCA bifurcation (series 1036, image 12). Left MCA branches are diffusely irregular but perfused distally. Right M1 segment patent proximally. Focal signal cutoff at the mid-distal right M1 segment into the bifurcation (series 1036, image 11). This likely reflects a severe high-grade stenosis, as the right MCA branches are perfused distally. Diffuse atheromatous irregularity seen throughout the right MCA branches as well. Proximally. POSTERIOR CIRCULATION: Vertebral arteries largely code dominant and patent to the vertebrobasilar junction. Both PICA origins patent and normal. Basilar patent to its distal aspect without stenosis. Superior cerebral arteries patent bilaterally. Both PCAs primarily supplied via the basilar. Severe long segment stenosis involving the mid-distal right  P2 segment (series 1046, image 15). Severe multifocal distal left P2 and P3 stenoses present as well (series 1046, image 14). PCAs remain perfused to their distal aspects. No intracranial aneurysm or other vascular abnormality. IMPRESSION: MRI HEAD IMPRESSION: 1. Unchanged 1 cm hemorrhage centered at the posterior right lentiform nucleus/right internal capsule. No significant surrounding edema or regional mass effect. 2. 4 mm focus of diffusion abnormality involving the subcortical white matter of the right parietal corona radiata, consistent with a small acute to subacute small vessel type ischemic infarct. No associated hemorrhage. 3. Underlying age-related cerebral atrophy with moderate chronic microvascular ischemic disease, mildly progressed as compared to 2018. MRA HEAD IMPRESSION: 1. Technically limited exam due to motion artifact. 2. Negative intracranial MRA for large vessel occlusion. 3. Focal severe high-grade stenoses involving the mid-distal right M1 segment and distal left M1 segment/left MCA bifurcation. Left MCA branches remain perfused distally. 4. Severe multifocal bilateral P2 and distal left P3 stenoses as above. Electronically Signed   By: Jeannine Boga M.D.   On: 07/13/2020 19:16   DG Pelvis Portable  Result Date: 07/12/2020 CLINICAL DATA:  Right hip pain.  No reported injury. EXAM: PORTABLE PELVIS 1-2 VIEWS COMPARISON:  07/21/2018 outside pelvic radiographs FINDINGS: Partially visualized left hip hemiarthroplasty and partially visualized fixation hardware in the proximal right femur with no evidence of hardware fracture or loosening.  No evidence of hip dislocation on this single frontal view. No osseous fracture. No focal osseous lesions. Moderate right hip osteoarthritis. No suspicious focal osseous lesions. No pelvic diastasis. IMPRESSION: No acute osseous abnormality. No evidence of hip dislocation on this single frontal view. Moderate right hip osteoarthritis. Partially  visualized left hip hemiarthroplasty and proximal right femur fixation hardware, with no evidence of hardware complication. Electronically Signed   By: Ilona Sorrel M.D.   On: 07/12/2020 12:33   DG CHEST PORT 1 VIEW  Result Date: 07/15/2020 CLINICAL DATA:  COVID-19. EXAM: PORTABLE CHEST 1 VIEW COMPARISON:  07/12/2020. FINDINGS: Right dual-lumen catheter stable position. Stable cardiomegaly. Progressive bilateral pulmonary infiltrates/edema. Tiny bilateral pleural effusions cannot be excluded. No pneumothorax. Degenerative change thoracic spine. Surgical clips right chest. IMPRESSION: 1. Right dual-lumen catheter in stable position. 2. Stable cardiomegaly. 3. Progressive bilateral pulmonary infiltrates/edema. Tiny bilateral pleural effusions cannot be excluded. Electronically Signed   By: Marcello Moores  Register   On: 07/15/2020 05:24   DG Abd Portable 1V  Result Date: 07/13/2020 CLINICAL DATA:  Evaluate orogastric tube EXAM: PORTABLE ABDOMEN - 1 VIEW COMPARISON:  July 13, 2020 FINDINGS: The OG tube side port and distal tip are in the region the stomach. IMPRESSION: The OG tube is in good position. Electronically Signed   By: Dorise Bullion III M.D   On: 07/13/2020 11:04   DG Abd Portable 1V  Result Date: 07/13/2020 CLINICAL DATA:  Vomiting. EXAM: PORTABLE ABDOMEN - 1 VIEW COMPARISON:  None. FINDINGS: No evidence of dilated bowel loops. Aortic atherosclerotic calcification noted. Compression screw noted in the right hip. Left hip prosthesis is also seen. IMPRESSION: Unremarkable bowel gas pattern.  No acute findings. Electronically Signed   By: Marlaine Hind M.D.   On: 07/13/2020 05:08   ECHOCARDIOGRAM COMPLETE  Result Date: 07/14/2020    ECHOCARDIOGRAM REPORT   Patient Name:   CARRERA KIESEL Date of Exam: 07/14/2020 Medical Rec #:  [id number]     Height:       64.0 in Accession #:    [id number]    Weight:       174.6 lb Date of Birth:  05-13-1941      BSA:          1.847 m Patient Age:    22 years       BP:           114/75 mmHg Patient Gender: F             HR:           77 bpm. Exam Location:  Inpatient Procedure: 2D Echo, Color Doppler and Cardiac Doppler Indications:    Stroke 434.91 / I163.9  History:        Patient has no prior history of Echocardiogram examinations.                 Risk Factors:Dyslipidemia and Diabetes.  Sonographer:    Bernadene Person RDCS Referring Phys: [place]  1. Left ventricular ejection fraction, by estimation, is 60 to 65%. The left ventricle has normal function. The left ventricle has no regional wall motion abnormalities. There is moderate left ventricular hypertrophy. Left ventricular diastolic parameters are consistent with Grade I diastolic dysfunction (impaired relaxation). Elevated left ventricular end-diastolic pressure. The E/e' is 45.  2. Right ventricular systolic function is normal. The right ventricular size is normal.  3. The pericardial effusion is posterior to the left ventricle.  4. The mitral valve is abnormal.  Mild mitral valve regurgitation. Moderate mitral annular calcification.  5. The aortic valve is tricuspid. Aortic valve regurgitation is not visualized. Mild aortic valve sclerosis is present, with no evidence of aortic valve stenosis. Comparison(s): No prior Echocardiogram. FINDINGS  Left Ventricle: Left ventricular ejection fraction, by estimation, is 60 to 65%. The left ventricle has normal function. The left ventricle has no regional wall motion abnormalities. The left ventricular internal cavity size was normal in size. There is  moderate left ventricular hypertrophy. Left ventricular diastolic parameters are consistent with Grade I diastolic dysfunction (impaired relaxation). Elevated left ventricular end-diastolic pressure. The E/e' is 20. Right Ventricle: The right ventricular size is normal. No increase in right ventricular wall thickness. Right ventricular systolic function is normal. Left Atrium: Left atrial size was normal  in size. Right Atrium: Right atrial size was normal in size. Pericardium: Trivial pericardial effusion is present. The pericardial effusion is posterior to the left ventricle. Mitral Valve: The mitral valve is abnormal. There is mild thickening of the mitral valve leaflet(s). Moderate mitral annular calcification. Mild mitral valve regurgitation, with posteriorly-directed jet. Tricuspid Valve: The tricuspid valve is grossly normal. Tricuspid valve regurgitation is trivial. Aortic Valve: The aortic valve is tricuspid. Aortic valve regurgitation is not visualized. Mild aortic valve sclerosis is present, with no evidence of aortic valve stenosis. Pulmonic Valve: The pulmonic valve was normal in structure. Pulmonic valve regurgitation is trivial. Aorta: The aortic root and ascending aorta are structurally normal, with no evidence of dilitation. Venous: The inferior vena cava was not well visualized. IAS/Shunts: No atrial level shunt detected by color flow Doppler.  LEFT VENTRICLE PLAX 2D LVIDd:         4.30 cm  Diastology LVIDs:         2.80 cm  LV e' medial:    5.66 cm/s LV PW:         1.20 cm  LV E/e' medial:  21.9 LV IVS:        1.20 cm  LV e' lateral:   6.20 cm/s LVOT diam:     2.10 cm  LV E/e' lateral: 20.0 LV SV:         103 LV SV Index:   56 LVOT Area:     3.46 cm  RIGHT VENTRICLE RV S prime:     13.90 cm/s TAPSE (M-mode): 1.8 cm LEFT ATRIUM             Index       RIGHT ATRIUM           Index LA diam:        3.30 cm 1.79 cm/m  RA Area:     13.60 cm LA Vol (A2C):   51.0 ml 27.62 ml/m RA Volume:   28.50 ml  15.43 ml/m LA Vol (A4C):   50.1 ml 27.13 ml/m LA Biplane Vol: 51.9 ml 28.10 ml/m  AORTIC VALVE LVOT Vmax:   143.00 cm/s LVOT Vmean:  98.200 cm/s LVOT VTI:    0.296 m  AORTA Ao Root diam: 3.00 cm Ao Asc diam:  3.40 cm MITRAL VALVE                TRICUSPID VALVE MV Area (PHT): 3.21 cm     TR Peak grad:   23.2 mmHg MV Decel Time: 236 msec     TR Vmax:        241.00 cm/s MV E velocity: 124.00 cm/s MV A  velocity: 120.00 cm/s  SHUNTS MV E/A ratio:  1.03         Systemic VTI:  0.30 m                             Systemic Diam: 2.10 cm Lyman Bishop MD Electronically signed by Lyman Bishop MD Signature Date/Time: 07/14/2020/2:10:04 PM    Final    CT HEAD CODE STROKE WO CONTRAST  Addendum Date: 07/12/2020   ADDENDUM REPORT: 07/12/2020 10:16 ADDENDUM: Correction.  Findings were discussed with Dr. Theda Sers. Electronically Signed   By: Margaretha Sheffield MD   On: 07/12/2020 10:16   Result Date: 07/12/2020 CLINICAL DATA:  Code stroke.  Neuro deficit, acute stroke suspected. EXAM: CT HEAD WITHOUT CONTRAST TECHNIQUE: Contiguous axial images were obtained from the base of the skull through the vertex without intravenous contrast. COMPARISON:  MRI head August 17, 2016 FINDINGS: Brain: Acute hemorrhage in the right basal ganglia, centered in the region of the posterior putamen/posterior limb of the internal capsule. The hemorrhage measures approximately 7 x 4 by 6 mm. No evidence of acute large vascular territory infarct. Patchy white matter hypoattenuation, which is nonspecific but most likely related to chronic microvascular ischemic disease. Mild generalized atrophy. No hydrocephalus. No evidence of a mass lesion. No midline shift. Remote right cerebellar lacunar infarct. Vascular: Calcific atherosclerosis. Skull: No acute fracture. Sinuses/Orbits: Sinuses are clear.  Unremarkable orbits. Other: No mastoid effusion. IMPRESSION: 1. Acute 7 mm hemorrhage within the right basal ganglia. No substantial mass effect. 2. Chronic microvascular ischemic disease and remote right cerebellar lacunar infarct. Code stroke imaging results were communicated on 07/12/2020 at 10:00 am to provider Dr. Lorrin Goodell via telephone, who verbally acknowledged these results. Electronically Signed: By: Margaretha Sheffield MD On: 07/12/2020 10:05   VAS US CAROTID  Result Date: 07/14/2020 Carotid Arterial Duplex Study Indications:       CVA.  Risk Factors:      Hyperlipidemia, Diabetes. Limitations        Today's exam was limited due to the patient's inability or                    unwillingness to cooperate. Comparison Study:  no prior Performing Technologist: Abram Sander RVS  Examination Guidelines: A complete evaluation includes B-mode imaging, spectral Doppler, color Doppler, and power Doppler as needed of all accessible portions of each vessel. Bilateral testing is considered an integral part of a complete examination. Limited examinations for reoccurring indications may be performed as noted.  Right Carotid Findings: +----------+--------+--------+--------+------------------+--------------+           PSV cm/sEDV cm/sStenosisPlaque DescriptionComments       +----------+--------+--------+--------+------------------+--------------+ CCA Prox  54      9               heterogenous                     +----------+--------+--------+--------+------------------+--------------+ CCA Distal65      16              heterogenous                     +----------+--------+--------+--------+------------------+--------------+ ICA Prox  48      17      1-39%   heterogenous                     +----------+--------+--------+--------+------------------+--------------+ ICA Distal  Not visualized +----------+--------+--------+--------+------------------+--------------+ ECA       91      9                                                +----------+--------+--------+--------+------------------+--------------+ +----------+--------+-------+--------------------------+-------------------+           PSV cm/sEDV cmsDescribe                  Arm Pressure (mmHG) +----------+--------+-------+--------------------------+-------------------+ Subclavian               not visualized due to port                    +----------+--------+-------+--------------------------+-------------------+  +---------+--------+--------+--------------+ VertebralPSV cm/sEDV cm/sNot identified +---------+--------+--------+--------------+  Left Carotid Findings: +----------+--------+--------+--------+------------------+--------------+           PSV cm/sEDV cm/sStenosisPlaque DescriptionComments       +----------+--------+--------+--------+------------------+--------------+ CCA Prox  53      5               heterogenous                     +----------+--------+--------+--------+------------------+--------------+ CCA Distal73      10              heterogenous                     +----------+--------+--------+--------+------------------+--------------+ ICA Prox  127     16      1-39%   heterogenous                     +----------+--------+--------+--------+------------------+--------------+ ICA Distal                                          Not visualized +----------+--------+--------+--------+------------------+--------------+ ECA       220                                                      +----------+--------+--------+--------+------------------+--------------+ +----------+--------+--------+--------------+-------------------+           PSV cm/sEDV cm/sDescribe      Arm Pressure (mmHG) +----------+--------+--------+--------------+-------------------+ Subclavian                Not identified                    +----------+--------+--------+--------------+-------------------+ +---------+--------+--------+--------------+ VertebralPSV cm/sEDV cm/sNot identified +---------+--------+--------+--------------+   Summary: Right Carotid: Velocities in the right ICA are consistent with a 1-39% stenosis. Left Carotid: Velocities in the left ICA are consistent with a 1-39% stenosis. Vertebrals: Bilateral vertebral arteries were not visualized. *See table(s) above for measurements and observations.  Electronically signed by Antony Contras MD on 07/14/2020 at 1:04:27 PM.     Final

## 2020-07-25 NOTE — Progress Notes (Signed)
[facility] Progress Note   Subjective:  Pt seen on HD. No c/o.   Objective Vitals:   07/25/20 1100 07/25/20 1130 07/25/20 1200 07/25/20 1223  BP: (!) 91/55 133/65 129/71 127/64  Pulse: 70 66 68 72  Resp:      Temp:    (!) 97.4 F (36.3 C)  TempSrc:    Oral  SpO2:      Weight:    75.4 kg  Height:       Physical Exam General:  alert, nad   no jvd  Chest cta bilat  Cor reg no RG  Abd soft ntnd no ascites   Ext no LE edema   Alert, NF, ox3   RIJ TDC  Additional Objective Labs: Basic Metabolic Panel: Recent Labs  Lab 07/21/20 0503 07/23/20 0658 07/25/20 0924  NA 133* 132* 133*  K 4.5 4.4 4.1  CL 97* 95* 95*  CO2 24 23 24   GLUCOSE 158* 182* 145*  BUN 27* 43* 44*  CREATININE 5.38* 5.99* 5.88*  CALCIUM 8.6* 8.9 8.7*  PHOS  --  5.6* 5.2*   Liver Function Tests: Recent Labs  Lab 07/19/20 0200 07/23/20 0658 07/25/20 0924  AST 45*  --   --   ALT 31  --   --   ALKPHOS 80  --   --   BILITOT 0.9  --   --   PROT 6.6  --   --   ALBUMIN 2.7* 3.0* 2.8*   CBC: Recent Labs  Lab 07/20/20 0524 07/21/20 0503 07/22/20 0240 07/23/20 0658 07/25/20 0924  WBC 5.0 5.0 5.5 6.5 4.6  NEUTROABS 3.0 2.9 3.6  --   --   HGB 11.0* 10.3* 10.1* 10.3* 9.2*  HCT 34.7* 33.4* 32.2* 32.5* 29.7*  MCV 90.6 91.5 91.2 90.5 92.5  PLT 176 194 181 216 203   Medications: . sodium chloride Stopped (07/17/20 1515)   . amiodarone  200 mg Oral Daily  . apixaban  2.5 mg Oral BID  . aspirin EC  81 mg Oral Daily  . atorvastatin  20 mg Oral QHS  . chlorhexidine  15 mL Mouth Rinse BID  . Chlorhexidine Gluconate Cloth  6 each Topical Daily  . diltiazem  120 mg Oral Daily  . hydrALAZINE  25 mg Oral Q8H  . insulin aspart  0-6 Units Subcutaneous Q4H  . insulin glargine  30 Units Subcutaneous Daily  . mouth rinse  15 mL Mouth Rinse q12n4p  . midodrine  10 mg Oral Once  . pantoprazole  40 mg Oral BID    Dialysis Orders: MWF [facility] 3.5h   400/800 79kg 3K/2.25 bath P2  Hep 3500 RIJ TDC/ no AVF - calcitriol 0.5 ug tiw  Assessment/Plan: 1.  COVID-19 infection: Dx 12/13, s/p monoclonal antibody treatment on 12/17.  She was unvaccinated. Improving clinically. Off isolation from [facility] standpoint, will remain on isolation during dialysis until 07/28/2020. 2.  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 Some residual right hand weakness noted. 3. ESRD/ volume: Continue HD per usual MWF schedule - HD Friday. 4 kg under dry wt,BP's good.  4.  GI bleed with acute blood loss anemia: Hgb 10.3.  S/p EGD 12/23 showing gastritis and duodenitis without any focal bleeding.  Ongoing medical management with PPI. 5. CKD-MBD: Ca ok, Phos slightly high. Resume binders/VDRA. 6. Nutrition: Resumed renal diet with fluid restriction 7.  Paroxysmal atrial  fibrillation with rapid ventricular response: Back in sinus rhythm/rate controlled and now back on Eliquis following transient heparin drip. 8.  Dispo - our outpatient HD unit in [place] agreed to take her for OP HD this Monday 07/29/19, her last day of 21 day isolation.  So, if there is a SNF that will take her this weekend, she can be dc'd.     [name], MD 07/25/2020, 12:40 PM

## 2020-07-25 NOTE — Procedures (Signed)
   I was present at this dialysis session, have reviewed the session itself and made  appropriate changes [name] MD [place] pager [phone]   07/25/2020, 3:13 PM

## 2020-07-25 NOTE — Progress Notes (Deleted)
[facility] Progress Note   Subjective:  Pt seen on 5C getting HD.  No c/o's today.   Objective Vitals:   07/25/20 1100 07/25/20 1130 07/25/20 1200 07/25/20 1223  BP: (!) 91/55 133/65 129/71 127/64  Pulse: 70 66 68 72  Resp:      Temp:    (!) 97.4 F (36.3 C)  TempSrc:    Oral  SpO2:      Weight:    75.4 kg  Height:       Physical Exam General:  alert, nad   no jvd  Chest cta bilat  Cor reg no RG  Abd soft ntnd no ascites   Ext no LE edema   Alert, NF, ox3   RIJ TDC  Dialysis Orders: MWF [facility] 3.5h   400/800 79kg 3K/2.25 bath P2 Hep 3500 RIJ TDC/ no AVF - calcitriol 0.5 ug tiw  Assessment/Plan: 1.  COVID-19 infection: Dx 12/13, s/p monoclonal antibody treatment on 12/17.  She was unvaccinated. Improving clinically. Off isolation from [facility] standpoint, will remain on isolation during dialysis until 07/28/2020. 2.  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 Some residual right hand weakness noted. 3. ESRD/ volume: Continue HD per usual MWF schedule - HD Friday. 4 kg under dry wt, BP's good. HD today.  4.  GI bleed with acute blood loss anemia: Hgb 10.3.  S/p EGD 12/23 showing gastritis and duodenitis without any focal bleeding.  Ongoing medical management with PPI. 5. CKD-MBD: Ca ok, Phos slightly high. Resume binders/VDRA. 6. Nutrition: Resumed renal diet with fluid restriction 7.  Paroxysmal atrial fibrillation with rapid ventricular response: Back in sinus rhythm/rate controlled and now back on Eliquis following transient heparin drip.   [name], MD 07/25/2020, 12:35 PM     Additional Objective Labs: Basic Metabolic Panel: Recent Labs  Lab 07/21/20 0503 07/23/20 0658 07/25/20 0924  NA 133* 132* 133*  K 4.5 4.4 4.1  CL 97* 95* 95*  CO2 24 23 24   GLUCOSE 158* 182* 145*  BUN 27* 43* 44*  CREATININE  5.38* 5.99* 5.88*  CALCIUM 8.6* 8.9 8.7*  PHOS  --  5.6* 5.2*   Liver Function Tests: Recent Labs  Lab 07/19/20 0200 07/23/20 0658 07/25/20 0924  AST 45*  --   --   ALT 31  --   --   ALKPHOS 80  --   --   BILITOT 0.9  --   --   PROT 6.6  --   --   ALBUMIN 2.7* 3.0* 2.8*   CBC: Recent Labs  Lab 07/20/20 0524 07/21/20 0503 07/22/20 0240 07/23/20 0658 07/25/20 0924  WBC 5.0 5.0 5.5 6.5 4.6  NEUTROABS 3.0 2.9 3.6  --   --   HGB 11.0* 10.3* 10.1* 10.3* 9.2*  HCT 34.7* 33.4* 32.2* 32.5* 29.7*  MCV 90.6 91.5 91.2 90.5 92.5  PLT 176 194 181 216 203   Medications: . sodium chloride Stopped (07/17/20 1515)   . amiodarone  200 mg Oral Daily  . apixaban  2.5 mg Oral BID  . aspirin EC  81 mg Oral Daily  . atorvastatin  20 mg Oral QHS  . chlorhexidine  15 mL Mouth Rinse BID  . Chlorhexidine Gluconate Cloth  6 each Topical Daily  . diltiazem  120 mg Oral Daily  . hydrALAZINE  25 mg Oral Q8H  . insulin aspart  0-6 Units Subcutaneous Q4H  .  insulin glargine  30 Units Subcutaneous Daily  . mouth rinse  15 mL Mouth Rinse q12n4p  . midodrine  10 mg Oral Once  . pantoprazole  40 mg Oral BID

## 2020-07-26 DIAGNOSIS — I639 Cerebral infarction, unspecified: Secondary | ICD-10-CM

## 2020-07-26 DIAGNOSIS — I4891 Unspecified atrial fibrillation: Secondary | ICD-10-CM | POA: Diagnosis not present

## 2020-07-26 DIAGNOSIS — U071 COVID-19: Secondary | ICD-10-CM | POA: Diagnosis not present

## 2020-07-26 DIAGNOSIS — N186 End stage renal disease: Secondary | ICD-10-CM | POA: Diagnosis not present

## 2020-07-26 LAB — GLUCOSE, CAPILLARY
Glucose-Capillary: 154 mg/dL — ABNORMAL HIGH (ref 70–99)
Glucose-Capillary: 157 mg/dL — ABNORMAL HIGH (ref 70–99)
Glucose-Capillary: 151 mg/dL — ABNORMAL HIGH (ref 70–99)
Glucose-Capillary: 190 mg/dL — ABNORMAL HIGH (ref 70–99)
Glucose-Capillary: 159 mg/dL — ABNORMAL HIGH (ref 70–99)
Glucose-Capillary: 234 mg/dL — ABNORMAL HIGH (ref 70–99)

## 2020-07-26 MED ORDER — INSULIN ASPART 100 UNIT/ML ~~LOC~~ SOLN
0.0000 [IU] | Freq: Three times a day (TID) | SUBCUTANEOUS | Status: DC
  Administered 2020-07-26 (×3): 2 [IU] via SUBCUTANEOUS
  Administered 2020-07-27: 3 [IU] via SUBCUTANEOUS
  Administered 2020-07-27 (×2): 2 [IU] via SUBCUTANEOUS

## 2020-07-26 MED ORDER — INSULIN ASPART 100 UNIT/ML ~~LOC~~ SOLN
0.0000 [IU] | Freq: Every day | SUBCUTANEOUS | Status: DC
  Administered 2020-07-26 – 2020-07-29 (×3): 2 [IU] via SUBCUTANEOUS
  Administered 2020-07-30: 3 [IU] via SUBCUTANEOUS

## 2020-07-26 NOTE — Progress Notes (Signed)
[facility] Progress Note   Subjective:  Pt seen in room. No c/o.   Objective Vitals:   07/25/20 1223 07/25/20 1306 07/25/20 2023 07/26/20 0853  BP: 127/64 (!) 164/73 139/64 (!) 154/63  Pulse: 72 73 79   Resp:  18 15   Temp: (!) 97.4 F (36.3 C) 97.9 F (36.6 C) 98.9 F (37.2 C)   TempSrc: Oral Oral Oral   SpO2:   99%   Weight: 75.4 kg     Height:       Physical Exam General:  alert, nad   no jvd  Chest cta bilat  Cor reg no RG  Abd soft ntnd no ascites   Ext no LE edema   Alert, NF, ox3   RIJ TDC  Additional Objective Labs: Basic Metabolic Panel: Recent Labs  Lab 07/21/20 0503 07/23/20 0658 07/25/20 0924  NA 133* 132* 133*  K 4.5 4.4 4.1  CL 97* 95* 95*  CO2 24 23 24   GLUCOSE 158* 182* 145*  BUN 27* 43* 44*  CREATININE 5.38* 5.99* 5.88*  CALCIUM 8.6* 8.9 8.7*  PHOS  --  5.6* 5.2*   Liver Function Tests: Recent Labs  Lab 07/23/20 0658 07/25/20 0924  ALBUMIN 3.0* 2.8*   CBC: Recent Labs  Lab 07/20/20 0524 07/21/20 0503 07/22/20 0240 07/23/20 0658 07/25/20 0924  WBC 5.0 5.0 5.5 6.5 4.6  NEUTROABS 3.0 2.9 3.6  --   --   HGB 11.0* 10.3* 10.1* 10.3* 9.2*  HCT 34.7* 33.4* 32.2* 32.5* 29.7*  MCV 90.6 91.5 91.2 90.5 92.5  PLT 176 194 181 216 203   Medications: . sodium chloride Stopped (07/17/20 1515)   . amiodarone  200 mg Oral Daily  . apixaban  2.5 mg Oral BID  . aspirin EC  81 mg Oral Daily  . atorvastatin  20 mg Oral QHS  . chlorhexidine  15 mL Mouth Rinse BID  . Chlorhexidine Gluconate Cloth  6 each Topical Daily  . diltiazem  120 mg Oral Daily  . hydrALAZINE  25 mg Oral Q8H  . insulin aspart  0-5 Units Subcutaneous QHS  . insulin aspart  0-9 Units Subcutaneous TID WC  . insulin glargine  30 Units Subcutaneous Daily  . mouth rinse  15 mL Mouth Rinse q12n4p  . midodrine  10 mg Oral Once  . pantoprazole  40 mg Oral BID    Dialysis Orders: MWF [facility] 3.5h   400/800 79kg 3K/2.25 bath P2 Hep 3500 RIJ TDC/ no  AVF - calcitriol 0.5 ug tiw  Assessment/Plan: 1.  COVID-19 infection: Dx 12/13, s/p monoclonal antibody treatment on 12/17.  She was unvaccinated. Improving clinically. Off isolation from [facility] standpoint, will remain on isolation during dialysis until 07/28/2020. 2.  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 Some residual right hand weakness noted. 3. ESRD/ volume: cont HD MWF. HD Monday. Below dry wt.  4.  GI bleed with acute blood loss anemia: Hgb 10.3.  S/p EGD 12/23 showing gastritis and duodenitis without any focal bleeding.  Ongoing medical management with PPI. 5. CKD-MBD: Ca ok, Phos slightly high. Resume binders/VDRA. 6. Nutrition: Resumed renal diet with fluid restriction 7.  Paroxysmal atrial fibrillation with rapid ventricular response: Back in sinus rhythm/rate controlled and now back on Eliquis following transient heparin drip. 8.  Dispo - should be ready for DC Monday when COVID isolation is ended and she can get  SNF placement in [place].    [name], MD 07/26/2020, 12:18 PM

## 2020-07-26 NOTE — Plan of Care (Signed)

## 2020-07-26 NOTE — Progress Notes (Signed)
PROGRESS NOTE                                                                                                                                                                                                             Patient Demographics:    [name], is a 80 y.o. female, DOB - [birth year], UTM:[id number]  Outpatient Primary MD for the patient is [name], MD    LOS - 14  Admit date - 07/12/2020    Chief Complaint  Patient presents with  . Code Stroke  . GI Bleeding       Brief Narrative (HPI from H&P) - this is a 80 year old female with history of hypertension, lateral hip fractures, right-sided breast cancer s/p treatment including lumpectomy about 5 years ago, ESRD, dyslipidemia, DM type II, A. fib who was brought in the hospital on 07/12/2020 with altered mental status, was found to have ischemic stroke with hemorrhagic conversion, her stay was complicated by GI bleed and COVID-19 infection.  She was initially admitted under PCCM and was transferred to hospitalist service on 07/15/2020.  She has been seen so far by nephrology, neurology and PCCM.   Subjective:   Patient seen and examined.  She denies any complaints today, no chest pain, no shortness of breath,.   Assessment  & Plan :    Acute right basal ganglia hemorrhagic stroke- with minimal left-sided weakness. - This appears to be ischemic infarct with hemorrhagic conversion due to A. Fib.  -  Seen by stroke team, he is currently on aspirin, and Eliquis . -  LDL was within acceptable limits, echocardiogram nonacute, A1c slightly elevated  - will be discharged to SNF.   A. fib RVR.  -  Likely found this admission.  She is currently in and out of A. fib and RVR, have started her on Cardizem, amiodarone added by cardiology, stable TSH & echocardiogram noted with preserved EF.    On Eliquis now outpatient cardiology follow-up in 1 to 2 weeks post  discharge.  Acute GI bleed with blood loss related anemia.  Appears to be upper GI.  S/p packed RBC transfusion on 07/12/2020 1 unit, monitor H&H, PPI, GI on board and she underwent EGD on 07/17/2020, discussed the case with Dr. Nicki Guadalajara and Dr. Leonie Man neurology, okay for heparin drip which was started on 07/18/2020  along with twice daily PPI.  No further bleeding and switched to Eliquis on 07/19/2020.  H&H relatively stable.  ESRD.  On MWF schedule.    Neurology following continue outpatient regimen post discharge.  Dyslipidemia.  On statin.   Essential hypertension.  Very well controlled.  Continue diltiazem and hydralazine as well as as needed metoprolol.     Mild metabolic encephalopathy.  She is completely alert and oriented at this point in time.   COVID-19 infection diagnosed on 07/07/2020.  S/p antibody infusion.  Monitor.     History of right-sided breast cancer.  Outpatient follow-up with oncology doses 21-year-old problem.     Hip pain.  Chronic.  CT scan stable.  Monitor with supportive care.  PT OT.    Stable.   DM type II.  Hemoglobin A1c 7.9 on 07/13/2020.  On glipizide at home which is on hold.  Blood sugar still elevated despite of being on 20 units of Lantus.  Will increase to 30 units.  She only received 20 units so I will add 10 more today and starting tomorrow, she will be on 30 units.  Continue SSI.  Lab Results  Component Value Date   HGBA1C 7.9 (H) 07/13/2020    CBG (last 3)  Recent Labs    07/26/20 0322 07/26/20 0804 07/26/20 1242  GLUCAP 157* 151* 190*    Lab Results  Component Value Date   CHOL 99 07/14/2020   HDL 31 (L) 07/14/2020   LDLCALC 32 07/14/2020   TRIG 178 (H) 07/14/2020   CHOLHDL 3.2 07/14/2020    Lab Results  Component Value Date   HGBA1C 7.9 (H) 07/13/2020      Lab Results  Component Value Date   TSH 1.219 07/16/2020        Condition -stable  Family Communication  :   Patient alert and oriented.  Code  Status :  Full  Consults  : Neurology, PCCM, nephrology, cardiology  Procedures  :    EGD 12/23 - gastritis and duodenitis.  CT HEAD  07/12/2020  -   Acute 7 mm hemorrhage within the right basal ganglia. No substantial mass effect. Chronic microvascular ischemic disease and remote right cerebellar lacunar infarct.   CT HEAD WO CONTRAST - 07/13/2020 expected evolutionary changes in the acute right basal ganglia hemorrhage overall similar in size with slightly decreased density compared to previous CT.  No new abnormality.  MRI / MRA Head - unchanged 1 cm right posterior lentiform nucleus/internal capsule hemorrhage without significant surrounding edema or mass-effect.  4 mm diffusion abnormality involving subcortical white matter in the right parietal corona radiata consistent with acute small vessel type infarct.    MRA of the brain technically limited due to motion artifact but no large vessel occlusion.  Focal high-grade stenosis of the right distal M1 segment and distal left M1 segment bifurcation.  Severe multifocal bilateral P2 and distal left P3 stenosis.  Transthoracic Echocardiogram - left ventricular ejection fraction 60 to 65%.  00/00/2021  Bilateral Carotid Dopplers - bilateral 1-39% stenosis of carotids.    PUD Prophylaxis : PPI  Disposition Plan  :    Status is: Inpatient  Remains inpatient appropriate because:IV treatments appropriate due to intensity of illness or inability to take PO   Dispo: The patient is from: Home              Anticipated d/c is to: SNF              Anticipated d/c date  is: > 3 days              Patient currently is medically stable to d/c.   DVT Prophylaxis  :   Eliquis  Lab Results  Component Value Date   PLT 203 07/25/2020    Diet :  Diet Order            DIET SOFT Room service appropriate? Yes; Fluid consistency: Thin  Diet effective now                  Inpatient Medications  Scheduled Meds: . amiodarone  200 mg  Oral Daily  . apixaban  2.5 mg Oral BID  . aspirin EC  81 mg Oral Daily  . atorvastatin  20 mg Oral QHS  . chlorhexidine  15 mL Mouth Rinse BID  . Chlorhexidine Gluconate Cloth  6 each Topical Daily  . diltiazem  120 mg Oral Daily  . hydrALAZINE  25 mg Oral Q8H  . insulin aspart  0-5 Units Subcutaneous QHS  . insulin aspart  0-9 Units Subcutaneous TID WC  . insulin glargine  30 Units Subcutaneous Daily  . mouth rinse  15 mL Mouth Rinse q12n4p  . midodrine  10 mg Oral Once  . pantoprazole  40 mg Oral BID   Continuous Infusions: . sodium chloride Stopped (07/17/20 1515)   PRN Meds:.sodium chloride, docusate sodium, guaiFENesin-dextromethorphan, heparin sodium (porcine), metoprolol tartrate, ondansetron (ZOFRAN) IV, polyethylene glycol, sodium chloride flush  Antibiotics  :    Anti-infectives (From admission, onward)   None      Phillips Climes M.D on 07/26/2020 at 2:28 PM  To page go to [email or web address]   [facility] -  Office  [phone]   See all Orders from today for further details    Objective:   Vitals:   07/25/20 1306 07/25/20 2023 07/26/20 0853 07/26/20 1247  BP: (!) 164/73 139/64 (!) 154/63 (!) 154/62  Pulse: 73 79    Resp: 18 15    Temp: 97.9 F (36.6 C) 98.9 F (37.2 C)    TempSrc: Oral Oral    SpO2:  99%    Weight:      Height:        Wt Readings from Last 3 Encounters:  07/25/20 75.4 kg  04/21/20 77.9 kg  04/20/19 90.2 kg     Intake/Output Summary (Last 24 hours) at 07/26/2020 1428 Last data filed at 07/26/2020 0930 Gross per 24 hour  Intake 480 ml  Output --  Net 480 ml     Physical Exam  Awake Alert, Oriented X 3, No new F.N deficits, Normal affect Symmetrical Chest wall movement, Good air movement bilaterally, CTAB RRR,No Gallops,Rubs or new Murmurs, No Parasternal Heave +ve B.Sounds, Abd Soft, No tenderness, No rebound - guarding or rigidity. No Cyanosis, Clubbing or edema, No new Rash or bruise        Data Review:     CBC Recent Labs  Lab 07/20/20 0524 07/21/20 0503 07/22/20 0240 07/23/20 0658 07/25/20 0924  WBC 5.0 5.0 5.5 6.5 4.6  HGB 11.0* 10.3* 10.1* 10.3* 9.2*  HCT 34.7* 33.4* 32.2* 32.5* 29.7*  PLT 176 194 181 216 203  MCV 90.6 91.5 91.2 90.5 92.5  MCH 28.7 28.2 28.6 28.7 28.7  MCHC 31.7 30.8 31.4 31.7 31.0  RDW 17.0* 17.0* 16.8* 16.8* 16.6*  LYMPHSABS 0.9 0.9 0.9  --   --   MONOABS 0.9 0.9 0.8  --   --   EOSABS 0.1 0.1 0.1  --   --  BASOSABS 0.0 0.0 0.0  --   --     Recent Labs  Lab 07/21/20 0503 07/23/20 0658 07/25/20 0924  NA 133* 132* 133*  K 4.5 4.4 4.1  CL 97* 95* 95*  CO2 24 23 24   GLUCOSE 158* 182* 145*  BUN 27* 43* 44*  CREATININE 5.38* 5.99* 5.88*  CALCIUM 8.6* 8.9 8.7*  ALBUMIN  --  3.0* 2.8*    ------------------------------------------------------------------------------------------------------------------ No results for input(s): CHOL, HDL, LDLCALC, TRIG, CHOLHDL, LDLDIRECT in the last 72 hours.  Lab Results  Component Value Date   HGBA1C 7.9 (H) 07/13/2020   ------------------------------------------------------------------------------------------------------------------ No results for input(s): TSH, T4TOTAL, T3FREE, THYROIDAB in the last 72 hours.  Invalid input(s): FREET3  Cardiac Enzymes No results for input(s): CKMB, TROPONINI, MYOGLOBIN in the last 168 hours.  Invalid input(s): CK ------------------------------------------------------------------------------------------------------------------    Component Value Date/Time   BNP 113.8 (H) 07/19/2020 0200    Micro Results Recent Results (from the past 240 hour(s))  SARS CORONAVIRUS 2 (TAT 6-24 HRS) Nasopharyngeal Nasopharyngeal Swab     Status: Abnormal   Collection Time: 07/21/20  1:34 PM   Specimen: Nasopharyngeal Swab  Result Value Ref Range Status   SARS Coronavirus 2 POSITIVE (A) NEGATIVE Final    Comment: RESULT CALLED TO, READ BACK BY AND VERIFIED WITH: F.AFOR,RN @2142   07/21/2020 VANG.J (NOTE) SARS-CoV-2 target nucleic acids are DETECTED.  The SARS-CoV-2 RNA is generally detectable in upper and lower respiratory specimens during the acute phase of infection. Positive results are indicative of the presence of SARS-CoV-2 RNA. Clinical correlation with patient history and other diagnostic information is  necessary to determine patient infection status. Positive results do not rule out bacterial infection or co-infection with other viruses.  The expected result is Negative.  Fact Sheet for Patients: SugarRoll.be  Fact Sheet for Healthcare Providers: https://www.woods-mathews.com/  This test is not yet approved or cleared by the [place] FDA and  has been authorized for detection and/or diagnosis of SARS-CoV-2 by FDA under an Emergency Use Authorization (EUA). This EUA will remain  in effect (meaning this test can be used) f or the duration of the COVID-19 declaration under Section 564(b)(1) of the Act, 21 U.S.C. section 360bbb-3(b)(1), unless the authorization is terminated or revoked sooner.   Performed at [facility], [place] [address], [place], [place] [address]     Radiology Reports DG Chest 1 View  Result Date: 07/12/2020 CLINICAL DATA:  Patient with history of COVID-19. History of GI bleed. EXAM: CHEST  1 VIEW COMPARISON:  Chest radiograph 07/10/2020. FINDINGS: Stable right-sided dialysis catheter. Monitoring leads overlie the patient. Stable cardiomegaly. Aortic atherosclerosis. Site oval increased patchy opacities left mid lower lung. No definite pleural effusion or pneumothorax. Thoracic spine degenerative changes. Surgical clips right axilla. IMPRESSION: Increasing patchy opacities left mid and lower lung may represent infection in the appropriate clinical setting or potentially atelectasis. Electronically Signed   By: Lovey Newcomer M.D.   On: 07/12/2020 11:24   CT HEAD WO CONTRAST  Result  Date: 07/16/2020 CLINICAL DATA:  Stroke follow-up. EXAM: CT HEAD WITHOUT CONTRAST TECHNIQUE: Contiguous axial images were obtained from the base of the skull through the vertex without intravenous contrast. COMPARISON:  07/13/2020 head CT and MRI FINDINGS: Brain: Approximately 1 cm hyperdense focus at the posterior aspect of the right lentiform nucleus corresponding to the previously demonstrated hemorrhage is unchanged in size. There is no associated edema. No new intracranial hemorrhage, acute large territory infarct, mass, midline shift, or extra-axial fluid collection  is identified. Mild cerebral atrophy is within normal limits for age. Hypodensities in the cerebral white matter bilaterally are unchanged and nonspecific but compatible with mild to moderate chronic small vessel ischemic disease. Vascular: Calcified atherosclerosis at the skull base. Skull: No fracture or suspicious osseous lesion. Sinuses/Orbits: Moderate mucosal thickening in the frontal sinuses. Small right mastoid effusion. Bilateral cataract extraction. Other: None. IMPRESSION: 1. Unchanged 1 cm right basal ganglia hemorrhage. 2. No new intracranial abnormality. Electronically Signed   By: Logan Bores M.D.   On: 07/16/2020 15:15   CT HEAD WO CONTRAST  Result Date: 07/13/2020 CLINICAL DATA:  Follow-up examination of basal ganglia hemorrhage. EXAM: CT HEAD WITHOUT CONTRAST TECHNIQUE: Contiguous axial images were obtained from the base of the skull through the vertex without intravenous contrast. COMPARISON:  Prior CT from 07/12/2020 FINDINGS: Brain: Previously identified intraparenchymal hemorrhage at the posterior right lentiform nucleus again seen. Bleed has mildly dispersed and is less dense in appearance as compared to previous exam, measuring overall similar in size at 10 x 7 x 10 mm, previously 10 x 5 x 10 mm when measured in a similar fashion. No significant surrounding edema or regional mass effect. No other acute or interval  intracranial hemorrhage. No other acute large vessel territory infarct. No mass lesion, mass effect, or midline shift. No hydrocephalus or extra-axial fluid collection. Atrophy with chronic microvascular ischemic disease again noted. Vascular: No hyperdense vessel. Calcified atherosclerosis at the skull base. Skull: Scalp soft tissues and calvarium within normal limits. Sinuses/Orbits: Globes and orbital soft tissues demonstrate no acute finding. Mild mucoperiosteal thickening noted throughout the visualized paranasal sinuses. Mastoid air cells remain clear. Other: None. IMPRESSION: 1. Normal expected interval evolution of acute intraparenchymal hemorrhage positioned at the posterior right lentiform nucleus, overall similar in size but with slightly decreased density as compared to previous. No significant surrounding edema or regional mass effect. 2. No other new acute intracranial abnormality. 3. Atrophy with chronic microvascular ischemic disease. Electronically Signed   By: Jeannine Boga M.D.   On: 07/13/2020 18:45   CT PELVIS WO CONTRAST  Result Date: 07/14/2020 CLINICAL DATA:  Right hip pain EXAM: CT PELVIS WITHOUT CONTRAST TECHNIQUE: Multidetector CT imaging of the pelvis was performed following the standard protocol without intravenous contrast. COMPARISON:  Radiographs 07/12/2020 FINDINGS: Urinary Tract: Bladder and distal ureters obscured by streak artifact from the patient's bilateral hip hardware. Bowel:  Unremarkable, rectum partially obscured by streak artifact. Vascular/Lymphatic: Aortoiliac atherosclerotic vascular disease. No pathologic adenopathy identified. Reproductive:  Unremarkable Other:  No supplemental non-categorized findings. Musculoskeletal: Right hip screw with IM nail. Single distal interlocking screw along the IM nail. Inter trochanteric deformity from old fracture, no acute fracture is identified. Severe degenerative right hip arthropathy with loss of articular space and  spurring. Left hip hemiarthroplasty without discrete complicating feature identified. Mild spurring along the SI joints. There is lower lumbar spondylosis and degenerative disc disease leading to moderate left foraminal stenosis at L5-S1. IMPRESSION: 1. Severe degenerative right hip arthropathy. 2. Right hip screw with IM nail. Right intertrochanteric deformity from old fracture. No acute fracture is identified. 3. Left hip hemiarthroplasty without discrete complicating feature identified. 4. Lower lumbar spondylosis and degenerative disc disease leading to moderate left foraminal stenosis at L5-S1. 5. Aortic atherosclerosis. Aortic Atherosclerosis (ICD10-I70.0). Electronically Signed   By: Van Clines M.D.   On: 07/14/2020 14:12   MR ANGIO HEAD WO CONTRAST  Result Date: 07/13/2020 CLINICAL DATA:  Follow-up examination for acute stroke. EXAM: MRI HEAD WITHOUT CONTRAST MRA  HEAD WITHOUT CONTRAST TECHNIQUE: Multiplanar, multiecho pulse sequences of the brain and surrounding structures were obtained without intravenous contrast. Angiographic images of the head were obtained using MRA technique without contrast. COMPARISON:  Prior head CT from 07/12/2020 as well as previous brain MRI from 08/17/2016. FINDINGS: MRI HEAD FINDINGS Brain: Diffuse prominence of the CSF containing spaces compatible with generalized age-related cerebral atrophy. Patchy and confluent T2/FLAIR hyperintensity within the periventricular and deep white matter both cerebral hemispheres most likely related chronic microvascular ischemic disease. Patchy involvement of the pons. Overall, these changes are mildly progressed as compared to 2018. Few small remote lacunar type infarcts noted about the basal ganglia, most notable which present at the right caudothalamic groove. Previously identified hemorrhage centered at the posterior right lentiform nucleus/right internal capsule again seen, stable in size measuring up to approximately 1 cm in  greatest dimension. This is somewhat difficult to visualized by MR, and is most notable on T1 weighted sequence (series 16, image 26). No significant surrounding edema or regional mass effect. No other acute intracranial hemorrhage. Few scattered chronic micro hemorrhages noted at the right cerebellum and pons, likely small vessel/hypertensive in nature. Single punctate 4 mm focus of diffusion abnormality involving the subcortical white matter of the right parietal corona radiata noted, likely a small acute to subacute small vessel type ischemic infarct (series 5, image 74). No associated hemorrhage or mass effect. No other diffusion abnormality to suggest acute or subacute ischemia. Gray-white matter differentiation otherwise maintained. No mass lesion, mass effect, or midline shift. No hydrocephalus or extra-axial fluid collection. Pituitary gland suprasellar region normal. Midline structures intact. 1.3 cm ovoid well-circumscribed lesion positioned at the left petrous apex demonstrating heterogeneous T2/FLAIR signal intensity with hyperintense T1 signal intensity noted (series 16, image 14), nonspecific, but could reflect a small cholesterol granuloma versus asymmetric fatty marrow. Finding is relatively stable from previous MRI from 2018, and of doubtful significance. Vascular: Major intracranial vascular flow voids are maintained. Skull and upper cervical spine: Craniocervical junction within normal limits. Multilevel degenerative spondylosis noted within the visualized upper cervical spine with resultant mild-to-moderate spinal stenosis. Bone marrow signal intensity within normal limits. No scalp soft tissue abnormality. Sinuses/Orbits: Patient status post bilateral ocular lens replacement. Globes and orbital soft tissues demonstrate no acute finding. Mild scattered mucosal thickening noted throughout the paranasal sinuses. Trace bilateral mastoid effusions, of doubtful significance. Visualized nasopharynx  within normal limits. Other: None. MRA HEAD FINDINGS ANTERIOR CIRCULATION: Examination degraded by motion artifact. Visualized distal cervical segments of the internal carotid arteries are patent with symmetric antegrade flow. Petrous segments widely patent. Atheromatous irregularity throughout the carotid siphons with associated mild multifocal narrowing. A1 segments patent bilaterally. Normal anterior communicating artery complex. Anterior cerebral arteries patent to their distal aspects without high-grade stenosis. Short-segment severe stenosis at the distal left M1 segment/left MCA bifurcation (series 1036, image 12). Left MCA branches are diffusely irregular but perfused distally. Right M1 segment patent proximally. Focal signal cutoff at the mid-distal right M1 segment into the bifurcation (series 1036, image 11). This likely reflects a severe high-grade stenosis, as the right MCA branches are perfused distally. Diffuse atheromatous irregularity seen throughout the right MCA branches as well. Proximally. POSTERIOR CIRCULATION: Vertebral arteries largely code dominant and patent to the vertebrobasilar junction. Both PICA origins patent and normal. Basilar patent to its distal aspect without stenosis. Superior cerebral arteries patent bilaterally. Both PCAs primarily supplied via the basilar. Severe long segment stenosis involving the mid-distal right P2 segment (series 1046, image 15). Severe multifocal  distal left P2 and P3 stenoses present as well (series 1046, image 14). PCAs remain perfused to their distal aspects. No intracranial aneurysm or other vascular abnormality. IMPRESSION: MRI HEAD IMPRESSION: 1. Unchanged 1 cm hemorrhage centered at the posterior right lentiform nucleus/right internal capsule. No significant surrounding edema or regional mass effect. 2. 4 mm focus of diffusion abnormality involving the subcortical white matter of the right parietal corona radiata, consistent with a small acute to  subacute small vessel type ischemic infarct. No associated hemorrhage. 3. Underlying age-related cerebral atrophy with moderate chronic microvascular ischemic disease, mildly progressed as compared to 2018. MRA HEAD IMPRESSION: 1. Technically limited exam due to motion artifact. 2. Negative intracranial MRA for large vessel occlusion. 3. Focal severe high-grade stenoses involving the mid-distal right M1 segment and distal left M1 segment/left MCA bifurcation. Left MCA branches remain perfused distally. 4. Severe multifocal bilateral P2 and distal left P3 stenoses as above. Electronically Signed   By: Jeannine Boga M.D.   On: 07/13/2020 19:16   MR BRAIN WO CONTRAST  Result Date: 07/13/2020 CLINICAL DATA:  Follow-up examination for acute stroke. EXAM: MRI HEAD WITHOUT CONTRAST MRA HEAD WITHOUT CONTRAST TECHNIQUE: Multiplanar, multiecho pulse sequences of the brain and surrounding structures were obtained without intravenous contrast. Angiographic images of the head were obtained using MRA technique without contrast. COMPARISON:  Prior head CT from 07/12/2020 as well as previous brain MRI from 08/17/2016. FINDINGS: MRI HEAD FINDINGS Brain: Diffuse prominence of the CSF containing spaces compatible with generalized age-related cerebral atrophy. Patchy and confluent T2/FLAIR hyperintensity within the periventricular and deep white matter both cerebral hemispheres most likely related chronic microvascular ischemic disease. Patchy involvement of the pons. Overall, these changes are mildly progressed as compared to 2018. Few small remote lacunar type infarcts noted about the basal ganglia, most notable which present at the right caudothalamic groove. Previously identified hemorrhage centered at the posterior right lentiform nucleus/right internal capsule again seen, stable in size measuring up to approximately 1 cm in greatest dimension. This is somewhat difficult to visualized by MR, and is most notable on T1  weighted sequence (series 16, image 26). No significant surrounding edema or regional mass effect. No other acute intracranial hemorrhage. Few scattered chronic micro hemorrhages noted at the right cerebellum and pons, likely small vessel/hypertensive in nature. Single punctate 4 mm focus of diffusion abnormality involving the subcortical white matter of the right parietal corona radiata noted, likely a small acute to subacute small vessel type ischemic infarct (series 5, image 74). No associated hemorrhage or mass effect. No other diffusion abnormality to suggest acute or subacute ischemia. Gray-white matter differentiation otherwise maintained. No mass lesion, mass effect, or midline shift. No hydrocephalus or extra-axial fluid collection. Pituitary gland suprasellar region normal. Midline structures intact. 1.3 cm ovoid well-circumscribed lesion positioned at the left petrous apex demonstrating heterogeneous T2/FLAIR signal intensity with hyperintense T1 signal intensity noted (series 16, image 14), nonspecific, but could reflect a small cholesterol granuloma versus asymmetric fatty marrow. Finding is relatively stable from previous MRI from 2018, and of doubtful significance. Vascular: Major intracranial vascular flow voids are maintained. Skull and upper cervical spine: Craniocervical junction within normal limits. Multilevel degenerative spondylosis noted within the visualized upper cervical spine with resultant mild-to-moderate spinal stenosis. Bone marrow signal intensity within normal limits. No scalp soft tissue abnormality. Sinuses/Orbits: Patient status post bilateral ocular lens replacement. Globes and orbital soft tissues demonstrate no acute finding. Mild scattered mucosal thickening noted throughout the paranasal sinuses. Trace bilateral mastoid effusions, of  doubtful significance. Visualized nasopharynx within normal limits. Other: None. MRA HEAD FINDINGS ANTERIOR CIRCULATION: Examination degraded  by motion artifact. Visualized distal cervical segments of the internal carotid arteries are patent with symmetric antegrade flow. Petrous segments widely patent. Atheromatous irregularity throughout the carotid siphons with associated mild multifocal narrowing. A1 segments patent bilaterally. Normal anterior communicating artery complex. Anterior cerebral arteries patent to their distal aspects without high-grade stenosis. Short-segment severe stenosis at the distal left M1 segment/left MCA bifurcation (series 1036, image 12). Left MCA branches are diffusely irregular but perfused distally. Right M1 segment patent proximally. Focal signal cutoff at the mid-distal right M1 segment into the bifurcation (series 1036, image 11). This likely reflects a severe high-grade stenosis, as the right MCA branches are perfused distally. Diffuse atheromatous irregularity seen throughout the right MCA branches as well. Proximally. POSTERIOR CIRCULATION: Vertebral arteries largely code dominant and patent to the vertebrobasilar junction. Both PICA origins patent and normal. Basilar patent to its distal aspect without stenosis. Superior cerebral arteries patent bilaterally. Both PCAs primarily supplied via the basilar. Severe long segment stenosis involving the mid-distal right P2 segment (series 1046, image 15). Severe multifocal distal left P2 and P3 stenoses present as well (series 1046, image 14). PCAs remain perfused to their distal aspects. No intracranial aneurysm or other vascular abnormality. IMPRESSION: MRI HEAD IMPRESSION: 1. Unchanged 1 cm hemorrhage centered at the posterior right lentiform nucleus/right internal capsule. No significant surrounding edema or regional mass effect. 2. 4 mm focus of diffusion abnormality involving the subcortical white matter of the right parietal corona radiata, consistent with a small acute to subacute small vessel type ischemic infarct. No associated hemorrhage. 3. Underlying age-related  cerebral atrophy with moderate chronic microvascular ischemic disease, mildly progressed as compared to 2018. MRA HEAD IMPRESSION: 1. Technically limited exam due to motion artifact. 2. Negative intracranial MRA for large vessel occlusion. 3. Focal severe high-grade stenoses involving the mid-distal right M1 segment and distal left M1 segment/left MCA bifurcation. Left MCA branches remain perfused distally. 4. Severe multifocal bilateral P2 and distal left P3 stenoses as above. Electronically Signed   By: Jeannine Boga M.D.   On: 07/13/2020 19:16   DG Pelvis Portable  Result Date: 07/12/2020 CLINICAL DATA:  Right hip pain.  No reported injury. EXAM: PORTABLE PELVIS 1-2 VIEWS COMPARISON:  07/21/2018 outside pelvic radiographs FINDINGS: Partially visualized left hip hemiarthroplasty and partially visualized fixation hardware in the proximal right femur with no evidence of hardware fracture or loosening. No evidence of hip dislocation on this single frontal view. No osseous fracture. No focal osseous lesions. Moderate right hip osteoarthritis. No suspicious focal osseous lesions. No pelvic diastasis. IMPRESSION: No acute osseous abnormality. No evidence of hip dislocation on this single frontal view. Moderate right hip osteoarthritis. Partially visualized left hip hemiarthroplasty and proximal right femur fixation hardware, with no evidence of hardware complication. Electronically Signed   By: Ilona Sorrel M.D.   On: 07/12/2020 12:33   DG CHEST PORT 1 VIEW  Result Date: 07/15/2020 CLINICAL DATA:  COVID-19. EXAM: PORTABLE CHEST 1 VIEW COMPARISON:  07/12/2020. FINDINGS: Right dual-lumen catheter stable position. Stable cardiomegaly. Progressive bilateral pulmonary infiltrates/edema. Tiny bilateral pleural effusions cannot be excluded. No pneumothorax. Degenerative change thoracic spine. Surgical clips right chest. IMPRESSION: 1. Right dual-lumen catheter in stable position. 2. Stable cardiomegaly. 3.  Progressive bilateral pulmonary infiltrates/edema. Tiny bilateral pleural effusions cannot be excluded. Electronically Signed   By: Marcello Moores  Register   On: 07/15/2020 05:24   DG Abd Portable 1V  Result  Date: 07/13/2020 CLINICAL DATA:  Evaluate orogastric tube EXAM: PORTABLE ABDOMEN - 1 VIEW COMPARISON:  July 13, 2020 FINDINGS: The OG tube side port and distal tip are in the region the stomach. IMPRESSION: The OG tube is in good position. Electronically Signed   By: Dorise Bullion III M.D   On: 07/13/2020 11:04   DG Abd Portable 1V  Result Date: 07/13/2020 CLINICAL DATA:  Vomiting. EXAM: PORTABLE ABDOMEN - 1 VIEW COMPARISON:  None. FINDINGS: No evidence of dilated bowel loops. Aortic atherosclerotic calcification noted. Compression screw noted in the right hip. Left hip prosthesis is also seen. IMPRESSION: Unremarkable bowel gas pattern.  No acute findings. Electronically Signed   By: Marlaine Hind M.D.   On: 07/13/2020 05:08   ECHOCARDIOGRAM COMPLETE  Result Date: 07/14/2020    ECHOCARDIOGRAM REPORT   Patient Name:   ANNDEE CONNETT Date of Exam: 07/14/2020 Medical Rec #:  [id number]     Height:       64.0 in Accession #:    [id number]    Weight:       174.6 lb Date of Birth:  [birth year]      BSA:          1.847 m Patient Age:    13 years      BP:           114/75 mmHg Patient Gender: F             HR:           77 bpm. Exam Location:  Inpatient Procedure: 2D Echo, Color Doppler and Cardiac Doppler Indications:    Stroke 434.91 / I163.9  History:        Patient has no prior history of Echocardiogram examinations.                 Risk Factors:Dyslipidemia and Diabetes.  Sonographer:    Bernadene Person RDCS Referring Phys: [place]  1. Left ventricular ejection fraction, by estimation, is 60 to 65%. The left ventricle has normal function. The left ventricle has no regional wall motion abnormalities. There is moderate left ventricular hypertrophy. Left ventricular diastolic  parameters are consistent with Grade I diastolic dysfunction (impaired relaxation). Elevated left ventricular end-diastolic pressure. The E/e' is 18.  2. Right ventricular systolic function is normal. The right ventricular size is normal.  3. The pericardial effusion is posterior to the left ventricle.  4. The mitral valve is abnormal. Mild mitral valve regurgitation. Moderate mitral annular calcification.  5. The aortic valve is tricuspid. Aortic valve regurgitation is not visualized. Mild aortic valve sclerosis is present, with no evidence of aortic valve stenosis. Comparison(s): No prior Echocardiogram. FINDINGS  Left Ventricle: Left ventricular ejection fraction, by estimation, is 60 to 65%. The left ventricle has normal function. The left ventricle has no regional wall motion abnormalities. The left ventricular internal cavity size was normal in size. There is  moderate left ventricular hypertrophy. Left ventricular diastolic parameters are consistent with Grade I diastolic dysfunction (impaired relaxation). Elevated left ventricular end-diastolic pressure. The E/e' is 20. Right Ventricle: The right ventricular size is normal. No increase in right ventricular wall thickness. Right ventricular systolic function is normal. Left Atrium: Left atrial size was normal in size. Right Atrium: Right atrial size was normal in size. Pericardium: Trivial pericardial effusion is present. The pericardial effusion is posterior to the left ventricle. Mitral Valve: The mitral valve is abnormal. There is mild thickening of the mitral valve leaflet(s).  Moderate mitral annular calcification. Mild mitral valve regurgitation, with posteriorly-directed jet. Tricuspid Valve: The tricuspid valve is grossly normal. Tricuspid valve regurgitation is trivial. Aortic Valve: The aortic valve is tricuspid. Aortic valve regurgitation is not visualized. Mild aortic valve sclerosis is present, with no evidence of aortic valve stenosis. Pulmonic  Valve: The pulmonic valve was normal in structure. Pulmonic valve regurgitation is trivial. Aorta: The aortic root and ascending aorta are structurally normal, with no evidence of dilitation. Venous: The inferior vena cava was not well visualized. IAS/Shunts: No atrial level shunt detected by color flow Doppler.  LEFT VENTRICLE PLAX 2D LVIDd:         4.30 cm  Diastology LVIDs:         2.80 cm  LV e' medial:    5.66 cm/s LV PW:         1.20 cm  LV E/e' medial:  21.9 LV IVS:        1.20 cm  LV e' lateral:   6.20 cm/s LVOT diam:     2.10 cm  LV E/e' lateral: 20.0 LV SV:         103 LV SV Index:   56 LVOT Area:     3.46 cm  RIGHT VENTRICLE RV S prime:     13.90 cm/s TAPSE (M-mode): 1.8 cm LEFT ATRIUM             Index       RIGHT ATRIUM           Index LA diam:        3.30 cm 1.79 cm/m  RA Area:     13.60 cm LA Vol (A2C):   51.0 ml 27.62 ml/m RA Volume:   28.50 ml  15.43 ml/m LA Vol (A4C):   50.1 ml 27.13 ml/m LA Biplane Vol: 51.9 ml 28.10 ml/m  AORTIC VALVE LVOT Vmax:   143.00 cm/s LVOT Vmean:  98.200 cm/s LVOT VTI:    0.296 m  AORTA Ao Root diam: 3.00 cm Ao Asc diam:  3.40 cm MITRAL VALVE                TRICUSPID VALVE MV Area (PHT): 3.21 cm     TR Peak grad:   23.2 mmHg MV Decel Time: 236 msec     TR Vmax:        241.00 cm/s MV E velocity: 124.00 cm/s MV A velocity: 120.00 cm/s  SHUNTS MV E/A ratio:  1.03         Systemic VTI:  0.30 m                             Systemic Diam: 2.10 cm Lyman Bishop MD Electronically signed by Lyman Bishop MD Signature Date/Time: 07/14/2020/2:10:04 PM    Final    CT HEAD CODE STROKE WO CONTRAST  Addendum Date: 07/12/2020   ADDENDUM REPORT: 07/12/2020 10:16 ADDENDUM: Correction.  Findings were discussed with Dr. Theda Sers. Electronically Signed   By: Margaretha Sheffield MD   On: 07/12/2020 10:16   Result Date: 07/12/2020 CLINICAL DATA:  Code stroke.  Neuro deficit, acute stroke suspected. EXAM: CT HEAD WITHOUT CONTRAST TECHNIQUE: Contiguous axial images were obtained from  the base of the skull through the vertex without intravenous contrast. COMPARISON:  MRI head August 17, 2016 FINDINGS: Brain: Acute hemorrhage in the right basal ganglia, centered in the region of the posterior putamen/posterior limb of the internal capsule. The hemorrhage measures  approximately 7 x 4 by 6 mm. No evidence of acute large vascular territory infarct. Patchy white matter hypoattenuation, which is nonspecific but most likely related to chronic microvascular ischemic disease. Mild generalized atrophy. No hydrocephalus. No evidence of a mass lesion. No midline shift. Remote right cerebellar lacunar infarct. Vascular: Calcific atherosclerosis. Skull: No acute fracture. Sinuses/Orbits: Sinuses are clear.  Unremarkable orbits. Other: No mastoid effusion. IMPRESSION: 1. Acute 7 mm hemorrhage within the right basal ganglia. No substantial mass effect. 2. Chronic microvascular ischemic disease and remote right cerebellar lacunar infarct. Code stroke imaging results were communicated on 07/12/2020 at 10:00 am to provider Dr. Lorrin Goodell via telephone, who verbally acknowledged these results. Electronically Signed: By: Margaretha Sheffield MD On: 07/12/2020 10:05   VAS US CAROTID  Result Date: 07/14/2020 Carotid Arterial Duplex Study Indications:       CVA. Risk Factors:      Hyperlipidemia, Diabetes. Limitations        Today's exam was limited due to the patient's inability or                    unwillingness to cooperate. Comparison Study:  no prior Performing Technologist: Abram Sander RVS  Examination Guidelines: A complete evaluation includes B-mode imaging, spectral Doppler, color Doppler, and power Doppler as needed of all accessible portions of each vessel. Bilateral testing is considered an integral part of a complete examination. Limited examinations for reoccurring indications may be performed as noted.  Right Carotid Findings: +----------+--------+--------+--------+------------------+--------------+            PSV cm/sEDV cm/sStenosisPlaque DescriptionComments       +----------+--------+--------+--------+------------------+--------------+ CCA Prox  54      9               heterogenous                     +----------+--------+--------+--------+------------------+--------------+ CCA Distal65      16              heterogenous                     +----------+--------+--------+--------+------------------+--------------+ ICA Prox  48      17      1-39%   heterogenous                     +----------+--------+--------+--------+------------------+--------------+ ICA Distal                                          Not visualized +----------+--------+--------+--------+------------------+--------------+ ECA       91      9                                                +----------+--------+--------+--------+------------------+--------------+ +----------+--------+-------+--------------------------+-------------------+           PSV cm/sEDV cmsDescribe                  Arm Pressure (mmHG) +----------+--------+-------+--------------------------+-------------------+ Subclavian               not visualized due to port                    +----------+--------+-------+--------------------------+-------------------+ +---------+--------+--------+--------------+ VertebralPSV cm/sEDV cm/sNot identified +---------+--------+--------+--------------+  Left Carotid  Findings: +----------+--------+--------+--------+------------------+--------------+           PSV cm/sEDV cm/sStenosisPlaque DescriptionComments       +----------+--------+--------+--------+------------------+--------------+ CCA Prox  53      5               heterogenous                     +----------+--------+--------+--------+------------------+--------------+ CCA Distal73      10              heterogenous                     +----------+--------+--------+--------+------------------+--------------+  ICA Prox  127     16      1-39%   heterogenous                     +----------+--------+--------+--------+------------------+--------------+ ICA Distal                                          Not visualized +----------+--------+--------+--------+------------------+--------------+ ECA       220                                                      +----------+--------+--------+--------+------------------+--------------+ +----------+--------+--------+--------------+-------------------+           PSV cm/sEDV cm/sDescribe      Arm Pressure (mmHG) +----------+--------+--------+--------------+-------------------+ Subclavian                Not identified                    +----------+--------+--------+--------------+-------------------+ +---------+--------+--------+--------------+ VertebralPSV cm/sEDV cm/sNot identified +---------+--------+--------+--------------+   Summary: Right Carotid: Velocities in the right ICA are consistent with a 1-39% stenosis. Left Carotid: Velocities in the left ICA are consistent with a 1-39% stenosis. Vertebrals: Bilateral vertebral arteries were not visualized. *See table(s) above for measurements and observations.  Electronically signed by Antony Contras MD on 07/14/2020 at 1:04:27 PM.    Final

## 2020-07-27 DIAGNOSIS — I4891 Unspecified atrial fibrillation: Secondary | ICD-10-CM | POA: Diagnosis not present

## 2020-07-27 LAB — RENAL FUNCTION PANEL
Albumin: 2.8 g/dL — ABNORMAL LOW (ref 3.5–5.0)
Anion gap: 15 (ref 5–15)
BUN: 43 mg/dL — ABNORMAL HIGH (ref 8–23)
CO2: 23 mmol/L (ref 22–32)
Calcium: 9.3 mg/dL (ref 8.9–10.3)
Chloride: 96 mmol/L — ABNORMAL LOW (ref 98–111)
Creatinine, Ser: 6.09 mg/dL — ABNORMAL HIGH (ref 0.44–1.00)
GFR, Estimated: 7 mL/min — ABNORMAL LOW (ref >60)
Glucose, Bld: 232 mg/dL — ABNORMAL HIGH (ref 70–99)
Phosphorus: 5.5 mg/dL — ABNORMAL HIGH (ref 2.5–4.6)
Potassium: 4.5 mmol/L (ref 3.5–5.1)
Sodium: 134 mmol/L — ABNORMAL LOW (ref 135–145)

## 2020-07-27 LAB — GLUCOSE, CAPILLARY
Glucose-Capillary: 163 mg/dL — ABNORMAL HIGH (ref 70–99)
Glucose-Capillary: 208 mg/dL — ABNORMAL HIGH (ref 70–99)
Glucose-Capillary: 180 mg/dL — ABNORMAL HIGH (ref 70–99)
Glucose-Capillary: 182 mg/dL — ABNORMAL HIGH (ref 70–99)

## 2020-07-27 LAB — CBC
HCT: 29.2 % — ABNORMAL LOW (ref 36.0–46.0)
Hemoglobin: 9.7 g/dL — ABNORMAL LOW (ref 12.0–15.0)
MCH: 29.9 pg (ref 26.0–34.0)
MCHC: 33.2 g/dL (ref 30.0–36.0)
MCV: 90.1 fL (ref 80.0–100.0)
Platelets: 207 10*3/uL (ref 150–400)
RBC: 3.24 MIL/uL — ABNORMAL LOW (ref 3.87–5.11)
RDW: 16.9 % — ABNORMAL HIGH (ref 11.5–15.5)
WBC: 5.1 10*3/uL (ref 4.0–10.5)
nRBC: 0 % (ref 0.0–0.2)

## 2020-07-27 NOTE — Plan of Care (Signed)
  Problem: Education: Goal: Knowledge of risk factors and measures for prevention of condition will improve Outcome: Progressing   Problem: Coping: Goal: Psychosocial and spiritual needs will be supported Outcome: Progressing   Problem: Respiratory: Goal: Will maintain a patent airway Outcome: Progressing Goal: Complications related to the disease process, condition or treatment will be avoided or minimized Outcome: Progressing   Problem: Self-Care: Goal: Verbalization of feelings and concerns over difficulty with self-care will improve Outcome: Progressing Goal: Ability to communicate needs accurately will improve Outcome: Progressing

## 2020-07-27 NOTE — Progress Notes (Signed)
Occupational Therapy Treatment Patient Details Name: [name] MRN: [id number] DOB: 1941/02/05 Today's Date: 07/27/2020    History of present illness Pt is a 80 y.o. female with initial COVID dx on 07/07/20 and underwent antibody infusion, admitted 07/12/20 as code stroke with L-side facial droop and flaccid paralysis. Workup revealed 7 mm hemorrhage in R basal ganglia; not a tPA or IR candidate. Pt also with new onset PAF, GIB. S/p EGD 12/23. PMH includes CA, DM, ESRD (HD MWF).   OT comments  Pt continues to progress with therapies. Pt completing room level mobility using RW, toileting and standing grooming ADL overall with minA throughout. She continues to require intermittent cues for safety with RW and for sequencing through grooming ADL. Additional focus on RUE fine motor HEP/therapeutic activities given continued weakness and discoordination. Feel pt remains appropriate for SNF at this time. Will continue to follow acutely.   Follow Up Recommendations  SNF;Supervision/Assistance - 24 hour    Equipment Recommendations  3 in 1 bedside commode          Precautions / Restrictions Precautions Precautions: Fall Precaution Comments: watch for orthostatic Restrictions Weight Bearing Restrictions: No       Mobility Bed Mobility               General bed mobility comments: up in recliner upon arrival  Transfers Overall transfer level: Needs assistance Equipment used: Rolling walker (2 wheeled) Transfers: Sit to/from Stand Sit to Stand: Min assist         General transfer comment: assist to boost and steady at [place], stood from recliner and toilet    Balance Overall balance assessment: Needs assistance Sitting-balance support: No upper extremity supported;Feet supported Sitting balance-Leahy Scale: Fair     Standing balance support: Bilateral upper extremity supported;During functional activity Standing balance-Leahy Scale: Poor Standing balance comment: UE support  and/or external assist                           ADL either performed or assessed with clinical judgement   ADL Overall ADL's : Needs assistance/impaired     Grooming: Minimal assistance;Standing;Wash/dry hands;Cueing for sequencing                   Toilet Transfer: Minimal assistance;Ambulation;Regular Toilet;Grab bars;RW   Toileting- Clothing Manipulation and Hygiene: Minimal assistance;Sit to/from stand Toileting - Clothing Manipulation Details (indicate cue type and reason): assist for balance while pt performing pericare     Functional mobility during ADLs: Minimal assistance;Rolling walker        Cognition Arousal/Alertness: Awake/alert Behavior During Therapy: Flat affect Overall Cognitive Status: Impaired/Different from baseline Area of Impairment: Attention;Memory;Following commands;Safety/judgement;Awareness;Problem solving                   Current Attention Level: Selective Memory: Decreased short-term memory Following Commands: Follows multi-step commands consistently;Follows multi-step commands with increased time Safety/Judgement: Decreased awareness of safety;Decreased awareness of deficits Awareness: Emergent Problem Solving: Slow processing;Requires verbal cues;Difficulty sequencing          Exercises Exercises: Other exercises;Hand activities;General Lower Extremity General Exercises - Lower Extremity Hip Flexion/Marching: Both;10 reps;Seated Hand Exercises Opposition: AROM;Both;5 reps;Seated Other Exercises Other Exercises: practiced picking up small items (small pieces of crumpled paper) and working on in hand manipulation/translation - picking up 3-4 at a time. pt with increased difficulty and x3-4 drops during attempts   Shoulder Instructions       General Comments  Pertinent Vitals/ Pain       Pain Assessment: No/denies pain  Home Living                                          Prior  Functioning/Environment              Frequency  Min 3X/week        Progress Toward Goals  OT Goals(current goals can now be found in the care plan section)  Progress towards OT goals: Progressing toward goals  Acute Rehab OT Goals Patient Stated Goal: to "get back to living" OT Goal Formulation: With patient Time For Goal Achievement: 07/30/20 Potential to Achieve Goals: Good ADL Goals Pt Will Perform Eating: with modified independence;sitting Pt Will Perform Grooming: sitting;with set-up Pt Will Perform Upper Body Bathing: with set-up;with supervision;sitting Pt Will Perform Lower Body Bathing: with supervision;with set-up;sit to/from stand Pt Will Transfer to Toilet: with supervision;bedside commode;ambulating Additional ADL Goal #1: Pt will demonstrate anticipatory awareness during ADL tasks  Plan Discharge plan remains appropriate    Co-evaluation                 AM-PAC OT "6 Clicks" Daily Activity     Outcome Measure   Help from another person eating meals?: A Little Help from another person taking care of personal grooming?: A Little Help from another person toileting, which includes using toliet, bedpan, or urinal?: A Lot Help from another person bathing (including washing, rinsing, drying)?: A Lot Help from another person to put on and taking off regular upper body clothing?: A Lot Help from another person to put on and taking off regular lower body clothing?: A Lot 6 Click Score: 14    End of Session Equipment Utilized During Treatment: Gait belt;Rolling walker  OT Visit Diagnosis: Unsteadiness on feet (R26.81);Other abnormalities of gait and mobility (R26.89);Muscle weakness (generalized) (M62.81);Low vision, both eyes (H54.2);Other symptoms and signs involving cognitive function Pain - Right/Left: Right Pain - part of body: Hip   Activity Tolerance Patient tolerated treatment well   Patient Left in chair;with call bell/phone within reach;with  chair alarm set   Nurse Communication Mobility status        Time: 0881-1031 OT Time Calculation (min): 16 min  Charges: OT General Charges $OT Visit: 1 Visit OT Treatments $Self Care/Home Management : 8-22 mins  [name], [place] Pager [phone] Office [phone]    Raymondo Band 07/27/2020, 4:36 PM

## 2020-07-27 NOTE — Progress Notes (Signed)
[facility] Progress Note   Subjective:  Patient not examined today directly given COVID-19 + status, utilizing data taken from chart +/- discussions w/ providers and staff.    Objective Vitals:   07/26/20 1247 07/26/20 1958 07/27/20 0414 07/27/20 1529  BP: (!) 154/62 (!) 151/60 (!) 152/55 140/66  Pulse: 74 74 75 73  Resp: 16 17 17 18   Temp: 98.1 F (36.7 C) 98 F (36.7 C) 98.2 F (36.8 C) 99.1 F (37.3 C)  TempSrc: Oral Oral Oral Oral  SpO2: 98% 99% 95% 98%  Weight:  77.2 kg 77.2 kg   Height:       Physical Exam General:   Patient not examined today directly given COVID-19 + status, utilizing data taken from chart +/- discussions w/ providers and staff.    Additional Objective Labs: Basic Metabolic Panel: Recent Labs  Lab 07/23/20 0658 07/25/20 0924 07/27/20 0225  NA 132* 133* 134*  K 4.4 4.1 4.5  CL 95* 95* 96*  CO2 23 24 23   GLUCOSE 182* 145* 232*  BUN 43* 44* 43*  CREATININE 5.99* 5.88* 6.09*  CALCIUM 8.9 8.7* 9.3  PHOS 5.6* 5.2* 5.5*   Liver Function Tests: Recent Labs  Lab 07/23/20 0658 07/25/20 0924 07/27/20 0225  ALBUMIN 3.0* 2.8* 2.8*   CBC: Recent Labs  Lab 07/21/20 0503 07/22/20 0240 07/23/20 0658 07/25/20 0924 07/27/20 0225  WBC 5.0 5.5 6.5 4.6 5.1  NEUTROABS 2.9 3.6  --   --   --   HGB 10.3* 10.1* 10.3* 9.2* 9.7*  HCT 33.4* 32.2* 32.5* 29.7* 29.2*  MCV 91.5 91.2 90.5 92.5 90.1  PLT 194 181 216 203 207   Medications: . sodium chloride Stopped (07/17/20 1515)   . amiodarone  200 mg Oral Daily  . apixaban  2.5 mg Oral BID  . atorvastatin  20 mg Oral QHS  . chlorhexidine  15 mL Mouth Rinse BID  . Chlorhexidine Gluconate Cloth  6 each Topical Daily  . diltiazem  120 mg Oral Daily  . hydrALAZINE  25 mg Oral Q8H  . insulin aspart  0-5 Units Subcutaneous QHS  . insulin aspart  0-9 Units Subcutaneous TID WC  . insulin glargine  30 Units Subcutaneous Daily  . mouth rinse  15 mL Mouth Rinse q12n4p  . midodrine  10 mg  Oral Once  . pantoprazole  40 mg Oral BID    Dialysis Orders: MWF [facility] 3.5h   400/800 79kg 3K/2.25 bath P2 Hep 3500 RIJ TDC/ no AVF - calcitriol 0.5 ug tiw  Assessment/Plan: 1.  COVID-19 infection: Dx 12/13, s/p monoclonal antibody treatment on 12/17.  She was unvaccinated. Improving clinically. Off isolation from [facility] standpoint, will remain on isolation during dialysis until 07/28/2020. 2.  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 Some residual right hand weakness noted. 3. ESRD/ volume: cont HD MWF. HD Monday. Below dry wt 2kg. BP's normal to slightly high.  4.  GI bleed with acute blood loss anemia: Hgb 10.3.  S/p EGD 12/23 showing gastritis and duodenitis without any focal bleeding.  Ongoing medical management with PPI. 5. CKD-MBD: Ca ok, Phos slightly high. Resume binders/VDRA. 6. Nutrition: Resumed renal diet with fluid restriction 7.  Paroxysmal atrial fibrillation with rapid ventricular response: Back in sinus rhythm/rate controlled and now back on Eliquis following transient heparin drip. 8.  Dispo - should be ready for DC Monday when COVID isolation  is ended and she can get SNF placement in [place].    [name], MD 07/27/2020, 3:42 PM

## 2020-07-27 NOTE — Progress Notes (Signed)
PROGRESS NOTE                                                                                                                                                                                                             Patient Demographics:    [name], is a 80 y.o. female, DOB - 01-21-1941, WLN:[id number]  Outpatient Primary MD for the patient is [name], MD    LOS - 15  Admit date - 07/12/2020    Chief Complaint  Patient presents with  . Code Stroke  . GI Bleeding       Brief Narrative (HPI from H&P) - this is a 80 year old female with history of hypertension, lateral hip fractures, right-sided breast cancer s/p treatment including lumpectomy about 5 years ago, ESRD, dyslipidemia, DM type II, A. fib who was brought in the hospital on 07/12/2020 with altered mental status, was found to have ischemic stroke with hemorrhagic conversion, her stay was complicated by GI bleed and COVID-19 infection.  She was initially admitted under PCCM and was transferred to hospitalist service on 07/15/2020.  She has been seen so far by nephrology, neurology and PCCM.   Subjective:   Patient seen and examined.  She denies any complaints today, no chest pain, no shortness of breath,.   Assessment  & Plan :    Acute right basal ganglia hemorrhagic stroke- with minimal left-sided weakness. - This appears to be ischemic infarct with hemorrhagic conversion due to A. Fib.  -  Seen by stroke team, initially on aspirin until she is cleared by GI, then she has been started on Eliquis, will discontinue aspirin. -  LDL was within acceptable limits, echocardiogram nonacute, A1c slightly elevated  - will be discharged to SNF.   A. fib RVR.  -  Likely found this admission.  She is currently in and out of A. fib and RVR, have started her on Cardizem, amiodarone added by cardiology, stable TSH & echocardiogram noted with preserved EF.    On  Eliquis now outpatient cardiology follow-up in 1 to 2 weeks post discharge.  Acute GI bleed with blood loss related anemia.  - Appears to be upper GI.  S/p packed RBC transfusion on 07/12/2020 1 unit, monitor H&H, PPI, GI on board and she underwent EGD on 07/17/2020, significant for duodenitis and clear base duodenal ulcer,  not as increased risk for recurrent bleed, so started on Eliquis . -Continue with PPI.  ESRD.  On MWF schedule.    Neurology following continue outpatient regimen post discharge.  Dyslipidemia.  On statin.   Essential hypertension.  Very well controlled.  Continue diltiazem and hydralazine as well as as needed metoprolol.     Mild metabolic encephalopathy.  She is completely alert and oriented at this point in time.   COVID-19 infection diagnosed on 07/07/2020.  S/p antibody infusion.  Monitor.     History of right-sided breast cancer.  Outpatient follow-up with oncology doses 69-year-old problem.     Hip pain.  Chronic.  CT scan stable.  Monitor with supportive care.  PT OT.    Stable.   DM type II.  Hemoglobin A1c 7.9 on 07/13/2020.  On glipizide at home which is on hold.  Blood sugar still elevated despite of being on 20 units of Lantus.  Will increase to 30 units.  She only received 20 units so I will add 10 more today and starting tomorrow, she will be on 30 units.  Continue SSI.  Lab Results  Component Value Date   HGBA1C 7.9 (H) 07/13/2020    CBG (last 3)  Recent Labs    07/26/20 1956 07/27/20 0746 07/27/20 1213  GLUCAP 234* 163* 208*    Lab Results  Component Value Date   CHOL 99 07/14/2020   HDL 31 (L) 07/14/2020   LDLCALC 32 07/14/2020   TRIG 178 (H) 07/14/2020   CHOLHDL 3.2 07/14/2020    Lab Results  Component Value Date   HGBA1C 7.9 (H) 07/13/2020      Lab Results  Component Value Date   TSH 1.219 07/16/2020        Condition -stable  Family Communication  :   Patient alert and oriented.  Code Status :   Full  Consults  : Neurology, PCCM, nephrology, cardiology  Procedures  :    EGD 12/23 - gastritis and duodenitis.  CT HEAD  07/12/2020  -   Acute 7 mm hemorrhage within the right basal ganglia. No substantial mass effect. Chronic microvascular ischemic disease and remote right cerebellar lacunar infarct.   CT HEAD WO CONTRAST - 07/13/2020 expected evolutionary changes in the acute right basal ganglia hemorrhage overall similar in size with slightly decreased density compared to previous CT.  No new abnormality.  MRI / MRA Head - unchanged 1 cm right posterior lentiform nucleus/internal capsule hemorrhage without significant surrounding edema or mass-effect.  4 mm diffusion abnormality involving subcortical white matter in the right parietal corona radiata consistent with acute small vessel type infarct.    MRA of the brain technically limited due to motion artifact but no large vessel occlusion.  Focal high-grade stenosis of the right distal M1 segment and distal left M1 segment bifurcation.  Severe multifocal bilateral P2 and distal left P3 stenosis.  Transthoracic Echocardiogram - left ventricular ejection fraction 60 to 65%.  00/00/2021  Bilateral Carotid Dopplers - bilateral 1-39% stenosis of carotids.    PUD Prophylaxis : PPI  Disposition Plan  :    Status is: Inpatient  Remains inpatient appropriate because:IV treatments appropriate due to intensity of illness or inability to take PO   Dispo: The patient is from: Home              Anticipated d/c is to: SNF              Anticipated d/c date is: > 3 days  Patient currently is medically stable to d/c.   DVT Prophylaxis  :   Eliquis  Lab Results  Component Value Date   PLT 207 07/27/2020    Diet :  Diet Order            DIET SOFT Room service appropriate? Yes; Fluid consistency: Thin  Diet effective now                  Inpatient Medications  Scheduled Meds: . amiodarone  200 mg Oral Daily   . apixaban  2.5 mg Oral BID  . atorvastatin  20 mg Oral QHS  . chlorhexidine  15 mL Mouth Rinse BID  . Chlorhexidine Gluconate Cloth  6 each Topical Daily  . diltiazem  120 mg Oral Daily  . hydrALAZINE  25 mg Oral Q8H  . insulin aspart  0-5 Units Subcutaneous QHS  . insulin aspart  0-9 Units Subcutaneous TID WC  . insulin glargine  30 Units Subcutaneous Daily  . mouth rinse  15 mL Mouth Rinse q12n4p  . midodrine  10 mg Oral Once  . pantoprazole  40 mg Oral BID   Continuous Infusions: . sodium chloride Stopped (07/17/20 1515)   PRN Meds:.sodium chloride, docusate sodium, guaiFENesin-dextromethorphan, heparin sodium (porcine), metoprolol tartrate, ondansetron (ZOFRAN) IV, polyethylene glycol, sodium chloride flush  Antibiotics  :    Anti-infectives (From admission, onward)   None      Phillips Climes M.D on 07/27/2020 at 1:50 PM  To page go to [email or web address]   [facility] -  Office  [phone]   See all Orders from today for further details    Objective:   Vitals:   07/26/20 0853 07/26/20 1247 07/26/20 1958 07/27/20 0414  BP: (!) 154/63 (!) 154/62 (!) 151/60 (!) 152/55  Pulse:  74 74 75  Resp:  16 17 17   Temp:  98.1 F (36.7 C) 98 F (36.7 C) 98.2 F (36.8 C)  TempSrc:  Oral Oral Oral  SpO2:  98% 99% 95%  Weight:   77.2 kg 77.2 kg  Height:        Wt Readings from Last 3 Encounters:  07/27/20 77.2 kg  04/21/20 77.9 kg  04/20/19 90.2 kg    No intake or output data in the 24 hours ending 07/27/20 1350   Physical Exam  Awake Alert, Oriented X 3, No new F.N deficits, Normal affect Symmetrical Chest wall movement, Good air movement bilaterally, CTAB RRR,No Gallops,Rubs or new Murmurs, No Parasternal Heave +ve B.Sounds, Abd Soft, No tenderness, No rebound - guarding or rigidity. No Cyanosis, Clubbing or edema, No new Rash or bruise       Data Review:    CBC Recent Labs  Lab 07/21/20 0503 07/22/20 0240 07/23/20 0658 07/25/20 0924  07/27/20 0225  WBC 5.0 5.5 6.5 4.6 5.1  HGB 10.3* 10.1* 10.3* 9.2* 9.7*  HCT 33.4* 32.2* 32.5* 29.7* 29.2*  PLT 194 181 216 203 207  MCV 91.5 91.2 90.5 92.5 90.1  MCH 28.2 28.6 28.7 28.7 29.9  MCHC 30.8 31.4 31.7 31.0 33.2  RDW 17.0* 16.8* 16.8* 16.6* 16.9*  LYMPHSABS 0.9 0.9  --   --   --   MONOABS 0.9 0.8  --   --   --   EOSABS 0.1 0.1  --   --   --   BASOSABS 0.0 0.0  --   --   --     Recent Labs  Lab 07/21/20 0503 07/23/20 4818 07/25/20 0924 07/27/20 0225  NA 133* 132* 133* 134*  K 4.5 4.4 4.1 4.5  CL 97* 95* 95* 96*  CO2 24 23 24 23   GLUCOSE 158* 182* 145* 232*  BUN 27* 43* 44* 43*  CREATININE 5.38* 5.99* 5.88* 6.09*  CALCIUM 8.6* 8.9 8.7* 9.3  ALBUMIN  --  3.0* 2.8* 2.8*    ------------------------------------------------------------------------------------------------------------------ No results for input(s): CHOL, HDL, LDLCALC, TRIG, CHOLHDL, LDLDIRECT in the last 72 hours.  Lab Results  Component Value Date   HGBA1C 7.9 (H) 07/13/2020   ------------------------------------------------------------------------------------------------------------------ No results for input(s): TSH, T4TOTAL, T3FREE, THYROIDAB in the last 72 hours.  Invalid input(s): FREET3  Cardiac Enzymes No results for input(s): CKMB, TROPONINI, MYOGLOBIN in the last 168 hours.  Invalid input(s): CK ------------------------------------------------------------------------------------------------------------------    Component Value Date/Time   BNP 113.8 (H) 07/19/2020 0200    Micro Results Recent Results (from the past 240 hour(s))  SARS CORONAVIRUS 2 (TAT 6-24 HRS) Nasopharyngeal Nasopharyngeal Swab     Status: Abnormal   Collection Time: 07/21/20  1:34 PM   Specimen: Nasopharyngeal Swab  Result Value Ref Range Status   SARS Coronavirus 2 POSITIVE (A) NEGATIVE Final    Comment: RESULT CALLED TO, READ BACK BY AND VERIFIED WITH: F.AFOR,RN @2142  07/21/2020 VANG.J (NOTE) SARS-CoV-2  target nucleic acids are DETECTED.  The SARS-CoV-2 RNA is generally detectable in upper and lower respiratory specimens during the acute phase of infection. Positive results are indicative of the presence of SARS-CoV-2 RNA. Clinical correlation with patient history and other diagnostic information is  necessary to determine patient infection status. Positive results do not rule out bacterial infection or co-infection with other viruses.  The expected result is Negative.  Fact Sheet for Patients: SugarRoll.be  Fact Sheet for Healthcare Providers: https://www.woods-mathews.com/  This test is not yet approved or cleared by the [place] FDA and  has been authorized for detection and/or diagnosis of SARS-CoV-2 by FDA under an Emergency Use Authorization (EUA). This EUA will remain  in effect (meaning this test can be used) f or the duration of the COVID-19 declaration under Section 564(b)(1) of the Act, 21 U.S.C. section 360bbb-3(b)(1), unless the authorization is terminated or revoked sooner.   Performed at [facility], [place] [address] [place], [place] [address]     Radiology Reports DG Chest 1 View  Result Date: 07/12/2020 CLINICAL DATA:  Patient with history of COVID-19. History of GI bleed. EXAM: CHEST  1 VIEW COMPARISON:  Chest radiograph 07/10/2020. FINDINGS: Stable right-sided dialysis catheter. Monitoring leads overlie the patient. Stable cardiomegaly. Aortic atherosclerosis. Site oval increased patchy opacities left mid lower lung. No definite pleural effusion or pneumothorax. Thoracic spine degenerative changes. Surgical clips right axilla. IMPRESSION: Increasing patchy opacities left mid and lower lung may represent infection in the appropriate clinical setting or potentially atelectasis. Electronically Signed   By: Lovey Newcomer M.D.   On: 07/12/2020 11:24   CT HEAD WO CONTRAST  Result Date: 07/16/2020 CLINICAL DATA:   Stroke follow-up. EXAM: CT HEAD WITHOUT CONTRAST TECHNIQUE: Contiguous axial images were obtained from the base of the skull through the vertex without intravenous contrast. COMPARISON:  07/13/2020 head CT and MRI FINDINGS: Brain: Approximately 1 cm hyperdense focus at the posterior aspect of the right lentiform nucleus corresponding to the previously demonstrated hemorrhage is unchanged in size. There is no associated edema. No new intracranial hemorrhage, acute large territory infarct, mass, midline shift, or extra-axial fluid collection is identified. Mild cerebral atrophy is within normal limits for age. Hypodensities in the cerebral  white matter bilaterally are unchanged and nonspecific but compatible with mild to moderate chronic small vessel ischemic disease. Vascular: Calcified atherosclerosis at the skull base. Skull: No fracture or suspicious osseous lesion. Sinuses/Orbits: Moderate mucosal thickening in the frontal sinuses. Small right mastoid effusion. Bilateral cataract extraction. Other: None. IMPRESSION: 1. Unchanged 1 cm right basal ganglia hemorrhage. 2. No new intracranial abnormality. Electronically Signed   By: Logan Bores M.D.   On: 07/16/2020 15:15   CT HEAD WO CONTRAST  Result Date: 07/13/2020 CLINICAL DATA:  Follow-up examination of basal ganglia hemorrhage. EXAM: CT HEAD WITHOUT CONTRAST TECHNIQUE: Contiguous axial images were obtained from the base of the skull through the vertex without intravenous contrast. COMPARISON:  Prior CT from 07/12/2020 FINDINGS: Brain: Previously identified intraparenchymal hemorrhage at the posterior right lentiform nucleus again seen. Bleed has mildly dispersed and is less dense in appearance as compared to previous exam, measuring overall similar in size at 10 x 7 x 10 mm, previously 10 x 5 x 10 mm when measured in a similar fashion. No significant surrounding edema or regional mass effect. No other acute or interval intracranial hemorrhage. No other  acute large vessel territory infarct. No mass lesion, mass effect, or midline shift. No hydrocephalus or extra-axial fluid collection. Atrophy with chronic microvascular ischemic disease again noted. Vascular: No hyperdense vessel. Calcified atherosclerosis at the skull base. Skull: Scalp soft tissues and calvarium within normal limits. Sinuses/Orbits: Globes and orbital soft tissues demonstrate no acute finding. Mild mucoperiosteal thickening noted throughout the visualized paranasal sinuses. Mastoid air cells remain clear. Other: None. IMPRESSION: 1. Normal expected interval evolution of acute intraparenchymal hemorrhage positioned at the posterior right lentiform nucleus, overall similar in size but with slightly decreased density as compared to previous. No significant surrounding edema or regional mass effect. 2. No other new acute intracranial abnormality. 3. Atrophy with chronic microvascular ischemic disease. Electronically Signed   By: Jeannine Boga M.D.   On: 07/13/2020 18:45   CT PELVIS WO CONTRAST  Result Date: 07/14/2020 CLINICAL DATA:  Right hip pain EXAM: CT PELVIS WITHOUT CONTRAST TECHNIQUE: Multidetector CT imaging of the pelvis was performed following the standard protocol without intravenous contrast. COMPARISON:  Radiographs 07/12/2020 FINDINGS: Urinary Tract: Bladder and distal ureters obscured by streak artifact from the patient's bilateral hip hardware. Bowel:  Unremarkable, rectum partially obscured by streak artifact. Vascular/Lymphatic: Aortoiliac atherosclerotic vascular disease. No pathologic adenopathy identified. Reproductive:  Unremarkable Other:  No supplemental non-categorized findings. Musculoskeletal: Right hip screw with IM nail. Single distal interlocking screw along the IM nail. Inter trochanteric deformity from old fracture, no acute fracture is identified. Severe degenerative right hip arthropathy with loss of articular space and spurring. Left hip hemiarthroplasty  without discrete complicating feature identified. Mild spurring along the SI joints. There is lower lumbar spondylosis and degenerative disc disease leading to moderate left foraminal stenosis at L5-S1. IMPRESSION: 1. Severe degenerative right hip arthropathy. 2. Right hip screw with IM nail. Right intertrochanteric deformity from old fracture. No acute fracture is identified. 3. Left hip hemiarthroplasty without discrete complicating feature identified. 4. Lower lumbar spondylosis and degenerative disc disease leading to moderate left foraminal stenosis at L5-S1. 5. Aortic atherosclerosis. Aortic Atherosclerosis (ICD10-I70.0). Electronically Signed   By: Van Clines M.D.   On: 07/14/2020 14:12   MR ANGIO HEAD WO CONTRAST  Result Date: 07/13/2020 CLINICAL DATA:  Follow-up examination for acute stroke. EXAM: MRI HEAD WITHOUT CONTRAST MRA HEAD WITHOUT CONTRAST TECHNIQUE: Multiplanar, multiecho pulse sequences of the brain and surrounding structures were  obtained without intravenous contrast. Angiographic images of the head were obtained using MRA technique without contrast. COMPARISON:  Prior head CT from 07/12/2020 as well as previous brain MRI from 08/17/2016. FINDINGS: MRI HEAD FINDINGS Brain: Diffuse prominence of the CSF containing spaces compatible with generalized age-related cerebral atrophy. Patchy and confluent T2/FLAIR hyperintensity within the periventricular and deep white matter both cerebral hemispheres most likely related chronic microvascular ischemic disease. Patchy involvement of the pons. Overall, these changes are mildly progressed as compared to 2018. Few small remote lacunar type infarcts noted about the basal ganglia, most notable which present at the right caudothalamic groove. Previously identified hemorrhage centered at the posterior right lentiform nucleus/right internal capsule again seen, stable in size measuring up to approximately 1 cm in greatest dimension. This is somewhat  difficult to visualized by MR, and is most notable on T1 weighted sequence (series 16, image 26). No significant surrounding edema or regional mass effect. No other acute intracranial hemorrhage. Few scattered chronic micro hemorrhages noted at the right cerebellum and pons, likely small vessel/hypertensive in nature. Single punctate 4 mm focus of diffusion abnormality involving the subcortical white matter of the right parietal corona radiata noted, likely a small acute to subacute small vessel type ischemic infarct (series 5, image 74). No associated hemorrhage or mass effect. No other diffusion abnormality to suggest acute or subacute ischemia. Gray-white matter differentiation otherwise maintained. No mass lesion, mass effect, or midline shift. No hydrocephalus or extra-axial fluid collection. Pituitary gland suprasellar region normal. Midline structures intact. 1.3 cm ovoid well-circumscribed lesion positioned at the left petrous apex demonstrating heterogeneous T2/FLAIR signal intensity with hyperintense T1 signal intensity noted (series 16, image 14), nonspecific, but could reflect a small cholesterol granuloma versus asymmetric fatty marrow. Finding is relatively stable from previous MRI from 2018, and of doubtful significance. Vascular: Major intracranial vascular flow voids are maintained. Skull and upper cervical spine: Craniocervical junction within normal limits. Multilevel degenerative spondylosis noted within the visualized upper cervical spine with resultant mild-to-moderate spinal stenosis. Bone marrow signal intensity within normal limits. No scalp soft tissue abnormality. Sinuses/Orbits: Patient status post bilateral ocular lens replacement. Globes and orbital soft tissues demonstrate no acute finding. Mild scattered mucosal thickening noted throughout the paranasal sinuses. Trace bilateral mastoid effusions, of doubtful significance. Visualized nasopharynx within normal limits. Other: None. MRA  HEAD FINDINGS ANTERIOR CIRCULATION: Examination degraded by motion artifact. Visualized distal cervical segments of the internal carotid arteries are patent with symmetric antegrade flow. Petrous segments widely patent. Atheromatous irregularity throughout the carotid siphons with associated mild multifocal narrowing. A1 segments patent bilaterally. Normal anterior communicating artery complex. Anterior cerebral arteries patent to their distal aspects without high-grade stenosis. Short-segment severe stenosis at the distal left M1 segment/left MCA bifurcation (series 1036, image 12). Left MCA branches are diffusely irregular but perfused distally. Right M1 segment patent proximally. Focal signal cutoff at the mid-distal right M1 segment into the bifurcation (series 1036, image 11). This likely reflects a severe high-grade stenosis, as the right MCA branches are perfused distally. Diffuse atheromatous irregularity seen throughout the right MCA branches as well. Proximally. POSTERIOR CIRCULATION: Vertebral arteries largely code dominant and patent to the vertebrobasilar junction. Both PICA origins patent and normal. Basilar patent to its distal aspect without stenosis. Superior cerebral arteries patent bilaterally. Both PCAs primarily supplied via the basilar. Severe long segment stenosis involving the mid-distal right P2 segment (series 1046, image 15). Severe multifocal distal left P2 and P3 stenoses present as well (series 1046, image 14). PCAs remain  perfused to their distal aspects. No intracranial aneurysm or other vascular abnormality. IMPRESSION: MRI HEAD IMPRESSION: 1. Unchanged 1 cm hemorrhage centered at the posterior right lentiform nucleus/right internal capsule. No significant surrounding edema or regional mass effect. 2. 4 mm focus of diffusion abnormality involving the subcortical white matter of the right parietal corona radiata, consistent with a small acute to subacute small vessel type ischemic  infarct. No associated hemorrhage. 3. Underlying age-related cerebral atrophy with moderate chronic microvascular ischemic disease, mildly progressed as compared to 2018. MRA HEAD IMPRESSION: 1. Technically limited exam due to motion artifact. 2. Negative intracranial MRA for large vessel occlusion. 3. Focal severe high-grade stenoses involving the mid-distal right M1 segment and distal left M1 segment/left MCA bifurcation. Left MCA branches remain perfused distally. 4. Severe multifocal bilateral P2 and distal left P3 stenoses as above. Electronically Signed   By: Jeannine Boga M.D.   On: 07/13/2020 19:16   MR BRAIN WO CONTRAST  Result Date: 07/13/2020 CLINICAL DATA:  Follow-up examination for acute stroke. EXAM: MRI HEAD WITHOUT CONTRAST MRA HEAD WITHOUT CONTRAST TECHNIQUE: Multiplanar, multiecho pulse sequences of the brain and surrounding structures were obtained without intravenous contrast. Angiographic images of the head were obtained using MRA technique without contrast. COMPARISON:  Prior head CT from 07/12/2020 as well as previous brain MRI from 08/17/2016. FINDINGS: MRI HEAD FINDINGS Brain: Diffuse prominence of the CSF containing spaces compatible with generalized age-related cerebral atrophy. Patchy and confluent T2/FLAIR hyperintensity within the periventricular and deep white matter both cerebral hemispheres most likely related chronic microvascular ischemic disease. Patchy involvement of the pons. Overall, these changes are mildly progressed as compared to 2018. Few small remote lacunar type infarcts noted about the basal ganglia, most notable which present at the right caudothalamic groove. Previously identified hemorrhage centered at the posterior right lentiform nucleus/right internal capsule again seen, stable in size measuring up to approximately 1 cm in greatest dimension. This is somewhat difficult to visualized by MR, and is most notable on T1 weighted sequence (series 16, image  26). No significant surrounding edema or regional mass effect. No other acute intracranial hemorrhage. Few scattered chronic micro hemorrhages noted at the right cerebellum and pons, likely small vessel/hypertensive in nature. Single punctate 4 mm focus of diffusion abnormality involving the subcortical white matter of the right parietal corona radiata noted, likely a small acute to subacute small vessel type ischemic infarct (series 5, image 74). No associated hemorrhage or mass effect. No other diffusion abnormality to suggest acute or subacute ischemia. Gray-white matter differentiation otherwise maintained. No mass lesion, mass effect, or midline shift. No hydrocephalus or extra-axial fluid collection. Pituitary gland suprasellar region normal. Midline structures intact. 1.3 cm ovoid well-circumscribed lesion positioned at the left petrous apex demonstrating heterogeneous T2/FLAIR signal intensity with hyperintense T1 signal intensity noted (series 16, image 14), nonspecific, but could reflect a small cholesterol granuloma versus asymmetric fatty marrow. Finding is relatively stable from previous MRI from 2018, and of doubtful significance. Vascular: Major intracranial vascular flow voids are maintained. Skull and upper cervical spine: Craniocervical junction within normal limits. Multilevel degenerative spondylosis noted within the visualized upper cervical spine with resultant mild-to-moderate spinal stenosis. Bone marrow signal intensity within normal limits. No scalp soft tissue abnormality. Sinuses/Orbits: Patient status post bilateral ocular lens replacement. Globes and orbital soft tissues demonstrate no acute finding. Mild scattered mucosal thickening noted throughout the paranasal sinuses. Trace bilateral mastoid effusions, of doubtful significance. Visualized nasopharynx within normal limits. Other: None. MRA HEAD FINDINGS ANTERIOR CIRCULATION: Examination  degraded by motion artifact. Visualized distal  cervical segments of the internal carotid arteries are patent with symmetric antegrade flow. Petrous segments widely patent. Atheromatous irregularity throughout the carotid siphons with associated mild multifocal narrowing. A1 segments patent bilaterally. Normal anterior communicating artery complex. Anterior cerebral arteries patent to their distal aspects without high-grade stenosis. Short-segment severe stenosis at the distal left M1 segment/left MCA bifurcation (series 1036, image 12). Left MCA branches are diffusely irregular but perfused distally. Right M1 segment patent proximally. Focal signal cutoff at the mid-distal right M1 segment into the bifurcation (series 1036, image 11). This likely reflects a severe high-grade stenosis, as the right MCA branches are perfused distally. Diffuse atheromatous irregularity seen throughout the right MCA branches as well. Proximally. POSTERIOR CIRCULATION: Vertebral arteries largely code dominant and patent to the vertebrobasilar junction. Both PICA origins patent and normal. Basilar patent to its distal aspect without stenosis. Superior cerebral arteries patent bilaterally. Both PCAs primarily supplied via the basilar. Severe long segment stenosis involving the mid-distal right P2 segment (series 1046, image 15). Severe multifocal distal left P2 and P3 stenoses present as well (series 1046, image 14). PCAs remain perfused to their distal aspects. No intracranial aneurysm or other vascular abnormality. IMPRESSION: MRI HEAD IMPRESSION: 1. Unchanged 1 cm hemorrhage centered at the posterior right lentiform nucleus/right internal capsule. No significant surrounding edema or regional mass effect. 2. 4 mm focus of diffusion abnormality involving the subcortical white matter of the right parietal corona radiata, consistent with a small acute to subacute small vessel type ischemic infarct. No associated hemorrhage. 3. Underlying age-related cerebral atrophy with moderate  chronic microvascular ischemic disease, mildly progressed as compared to 2018. MRA HEAD IMPRESSION: 1. Technically limited exam due to motion artifact. 2. Negative intracranial MRA for large vessel occlusion. 3. Focal severe high-grade stenoses involving the mid-distal right M1 segment and distal left M1 segment/left MCA bifurcation. Left MCA branches remain perfused distally. 4. Severe multifocal bilateral P2 and distal left P3 stenoses as above. Electronically Signed   By: Jeannine Boga M.D.   On: 07/13/2020 19:16   DG Pelvis Portable  Result Date: 07/12/2020 CLINICAL DATA:  Right hip pain.  No reported injury. EXAM: PORTABLE PELVIS 1-2 VIEWS COMPARISON:  07/21/2018 outside pelvic radiographs FINDINGS: Partially visualized left hip hemiarthroplasty and partially visualized fixation hardware in the proximal right femur with no evidence of hardware fracture or loosening. No evidence of hip dislocation on this single frontal view. No osseous fracture. No focal osseous lesions. Moderate right hip osteoarthritis. No suspicious focal osseous lesions. No pelvic diastasis. IMPRESSION: No acute osseous abnormality. No evidence of hip dislocation on this single frontal view. Moderate right hip osteoarthritis. Partially visualized left hip hemiarthroplasty and proximal right femur fixation hardware, with no evidence of hardware complication. Electronically Signed   By: Ilona Sorrel M.D.   On: 07/12/2020 12:33   DG CHEST PORT 1 VIEW  Result Date: 07/15/2020 CLINICAL DATA:  COVID-19. EXAM: PORTABLE CHEST 1 VIEW COMPARISON:  07/12/2020. FINDINGS: Right dual-lumen catheter stable position. Stable cardiomegaly. Progressive bilateral pulmonary infiltrates/edema. Tiny bilateral pleural effusions cannot be excluded. No pneumothorax. Degenerative change thoracic spine. Surgical clips right chest. IMPRESSION: 1. Right dual-lumen catheter in stable position. 2. Stable cardiomegaly. 3. Progressive bilateral pulmonary  infiltrates/edema. Tiny bilateral pleural effusions cannot be excluded. Electronically Signed   By: Marcello Moores  Register   On: 07/15/2020 05:24   DG Abd Portable 1V  Result Date: 07/13/2020 CLINICAL DATA:  Evaluate orogastric tube EXAM: PORTABLE ABDOMEN - 1 VIEW COMPARISON:  July 13, 2020 FINDINGS: The OG tube side port and distal tip are in the region the stomach. IMPRESSION: The OG tube is in good position. Electronically Signed   By: Dorise Bullion III M.D   On: 07/13/2020 11:04   DG Abd Portable 1V  Result Date: 07/13/2020 CLINICAL DATA:  Vomiting. EXAM: PORTABLE ABDOMEN - 1 VIEW COMPARISON:  None. FINDINGS: No evidence of dilated bowel loops. Aortic atherosclerotic calcification noted. Compression screw noted in the right hip. Left hip prosthesis is also seen. IMPRESSION: Unremarkable bowel gas pattern.  No acute findings. Electronically Signed   By: Marlaine Hind M.D.   On: 07/13/2020 05:08   ECHOCARDIOGRAM COMPLETE  Result Date: 07/14/2020    ECHOCARDIOGRAM REPORT   Patient Name:   FAYLYNN STAMOS Date of Exam: 07/14/2020 Medical Rec #:  [id number]     Height:       64.0 in Accession #:    [id number]    Weight:       174.6 lb Date of Birth:  03-27-1941      BSA:          1.847 m Patient Age:    35 years      BP:           114/75 mmHg Patient Gender: F             HR:           77 bpm. Exam Location:  Inpatient Procedure: 2D Echo, Color Doppler and Cardiac Doppler Indications:    Stroke 434.91 / I163.9  History:        Patient has no prior history of Echocardiogram examinations.                 Risk Factors:Dyslipidemia and Diabetes.  Sonographer:    Bernadene Person RDCS Referring Phys: [place]  1. Left ventricular ejection fraction, by estimation, is 60 to 65%. The left ventricle has normal function. The left ventricle has no regional wall motion abnormalities. There is moderate left ventricular hypertrophy. Left ventricular diastolic parameters are consistent with Grade I  diastolic dysfunction (impaired relaxation). Elevated left ventricular end-diastolic pressure. The E/e' is 20.  2. Right ventricular systolic function is normal. The right ventricular size is normal.  3. The pericardial effusion is posterior to the left ventricle.  4. The mitral valve is abnormal. Mild mitral valve regurgitation. Moderate mitral annular calcification.  5. The aortic valve is tricuspid. Aortic valve regurgitation is not visualized. Mild aortic valve sclerosis is present, with no evidence of aortic valve stenosis. Comparison(s): No prior Echocardiogram. FINDINGS  Left Ventricle: Left ventricular ejection fraction, by estimation, is 60 to 65%. The left ventricle has normal function. The left ventricle has no regional wall motion abnormalities. The left ventricular internal cavity size was normal in size. There is  moderate left ventricular hypertrophy. Left ventricular diastolic parameters are consistent with Grade I diastolic dysfunction (impaired relaxation). Elevated left ventricular end-diastolic pressure. The E/e' is 20. Right Ventricle: The right ventricular size is normal. No increase in right ventricular wall thickness. Right ventricular systolic function is normal. Left Atrium: Left atrial size was normal in size. Right Atrium: Right atrial size was normal in size. Pericardium: Trivial pericardial effusion is present. The pericardial effusion is posterior to the left ventricle. Mitral Valve: The mitral valve is abnormal. There is mild thickening of the mitral valve leaflet(s). Moderate mitral annular calcification. Mild mitral valve regurgitation, with posteriorly-directed jet. Tricuspid Valve: The tricuspid valve  is grossly normal. Tricuspid valve regurgitation is trivial. Aortic Valve: The aortic valve is tricuspid. Aortic valve regurgitation is not visualized. Mild aortic valve sclerosis is present, with no evidence of aortic valve stenosis. Pulmonic Valve: The pulmonic valve was normal in  structure. Pulmonic valve regurgitation is trivial. Aorta: The aortic root and ascending aorta are structurally normal, with no evidence of dilitation. Venous: The inferior vena cava was not well visualized. IAS/Shunts: No atrial level shunt detected by color flow Doppler.  LEFT VENTRICLE PLAX 2D LVIDd:         4.30 cm  Diastology LVIDs:         2.80 cm  LV e' medial:    5.66 cm/s LV PW:         1.20 cm  LV E/e' medial:  21.9 LV IVS:        1.20 cm  LV e' lateral:   6.20 cm/s LVOT diam:     2.10 cm  LV E/e' lateral: 20.0 LV SV:         103 LV SV Index:   56 LVOT Area:     3.46 cm  RIGHT VENTRICLE RV S prime:     13.90 cm/s TAPSE (M-mode): 1.8 cm LEFT ATRIUM             Index       RIGHT ATRIUM           Index LA diam:        3.30 cm 1.79 cm/m  RA Area:     13.60 cm LA Vol (A2C):   51.0 ml 27.62 ml/m RA Volume:   28.50 ml  15.43 ml/m LA Vol (A4C):   50.1 ml 27.13 ml/m LA Biplane Vol: 51.9 ml 28.10 ml/m  AORTIC VALVE LVOT Vmax:   143.00 cm/s LVOT Vmean:  98.200 cm/s LVOT VTI:    0.296 m  AORTA Ao Root diam: 3.00 cm Ao Asc diam:  3.40 cm MITRAL VALVE                TRICUSPID VALVE MV Area (PHT): 3.21 cm     TR Peak grad:   23.2 mmHg MV Decel Time: 236 msec     TR Vmax:        241.00 cm/s MV E velocity: 124.00 cm/s MV A velocity: 120.00 cm/s  SHUNTS MV E/A ratio:  1.03         Systemic VTI:  0.30 m                             Systemic Diam: 2.10 cm Lyman Bishop MD Electronically signed by Lyman Bishop MD Signature Date/Time: 07/14/2020/2:10:04 PM    Final    CT HEAD CODE STROKE WO CONTRAST  Addendum Date: 07/12/2020   ADDENDUM REPORT: 07/12/2020 10:16 ADDENDUM: Correction.  Findings were discussed with Dr. Theda Sers. Electronically Signed   By: Margaretha Sheffield MD   On: 07/12/2020 10:16   Result Date: 07/12/2020 CLINICAL DATA:  Code stroke.  Neuro deficit, acute stroke suspected. EXAM: CT HEAD WITHOUT CONTRAST TECHNIQUE: Contiguous axial images were obtained from the base of the skull through the vertex  without intravenous contrast. COMPARISON:  MRI head August 17, 2016 FINDINGS: Brain: Acute hemorrhage in the right basal ganglia, centered in the region of the posterior putamen/posterior limb of the internal capsule. The hemorrhage measures approximately 7 x 4 by 6 mm. No evidence of acute large vascular territory infarct. Patchy  white matter hypoattenuation, which is nonspecific but most likely related to chronic microvascular ischemic disease. Mild generalized atrophy. No hydrocephalus. No evidence of a mass lesion. No midline shift. Remote right cerebellar lacunar infarct. Vascular: Calcific atherosclerosis. Skull: No acute fracture. Sinuses/Orbits: Sinuses are clear.  Unremarkable orbits. Other: No mastoid effusion. IMPRESSION: 1. Acute 7 mm hemorrhage within the right basal ganglia. No substantial mass effect. 2. Chronic microvascular ischemic disease and remote right cerebellar lacunar infarct. Code stroke imaging results were communicated on 07/12/2020 at 10:00 am to provider Dr. Lorrin Goodell via telephone, who verbally acknowledged these results. Electronically Signed: By: Margaretha Sheffield MD On: 07/12/2020 10:05   VAS US CAROTID  Result Date: 07/14/2020 Carotid Arterial Duplex Study Indications:       CVA. Risk Factors:      Hyperlipidemia, Diabetes. Limitations        Today's exam was limited due to the patient's inability or                    unwillingness to cooperate. Comparison Study:  no prior Performing Technologist: Abram Sander RVS  Examination Guidelines: A complete evaluation includes B-mode imaging, spectral Doppler, color Doppler, and power Doppler as needed of all accessible portions of each vessel. Bilateral testing is considered an integral part of a complete examination. Limited examinations for reoccurring indications may be performed as noted.  Right Carotid Findings: +----------+--------+--------+--------+------------------+--------------+           PSV cm/sEDV  cm/sStenosisPlaque DescriptionComments       +----------+--------+--------+--------+------------------+--------------+ CCA Prox  54      9               heterogenous                     +----------+--------+--------+--------+------------------+--------------+ CCA Distal65      16              heterogenous                     +----------+--------+--------+--------+------------------+--------------+ ICA Prox  48      17      1-39%   heterogenous                     +----------+--------+--------+--------+------------------+--------------+ ICA Distal                                          Not visualized +----------+--------+--------+--------+------------------+--------------+ ECA       91      9                                                +----------+--------+--------+--------+------------------+--------------+ +----------+--------+-------+--------------------------+-------------------+           PSV cm/sEDV cmsDescribe                  Arm Pressure (mmHG) +----------+--------+-------+--------------------------+-------------------+ Subclavian               not visualized due to port                    +----------+--------+-------+--------------------------+-------------------+ +---------+--------+--------+--------------+ VertebralPSV cm/sEDV cm/sNot identified +---------+--------+--------+--------------+  Left Carotid Findings: +----------+--------+--------+--------+------------------+--------------+           PSV cm/sEDV cm/sStenosisPlaque DescriptionComments       +----------+--------+--------+--------+------------------+--------------+  CCA Prox  53      5               heterogenous                     +----------+--------+--------+--------+------------------+--------------+ CCA Distal73      10              heterogenous                     +----------+--------+--------+--------+------------------+--------------+ ICA Prox  127     16       1-39%   heterogenous                     +----------+--------+--------+--------+------------------+--------------+ ICA Distal                                          Not visualized +----------+--------+--------+--------+------------------+--------------+ ECA       220                                                      +----------+--------+--------+--------+------------------+--------------+ +----------+--------+--------+--------------+-------------------+           PSV cm/sEDV cm/sDescribe      Arm Pressure (mmHG) +----------+--------+--------+--------------+-------------------+ Subclavian                Not identified                    +----------+--------+--------+--------------+-------------------+ +---------+--------+--------+--------------+ VertebralPSV cm/sEDV cm/sNot identified +---------+--------+--------+--------------+   Summary: Right Carotid: Velocities in the right ICA are consistent with a 1-39% stenosis. Left Carotid: Velocities in the left ICA are consistent with a 1-39% stenosis. Vertebrals: Bilateral vertebral arteries were not visualized. *See table(s) above for measurements and observations.  Electronically signed by Antony Contras MD on 07/14/2020 at 1:04:27 PM.    Final

## 2020-07-28 DIAGNOSIS — I4891 Unspecified atrial fibrillation: Secondary | ICD-10-CM | POA: Diagnosis not present

## 2020-07-28 DIAGNOSIS — U071 COVID-19: Secondary | ICD-10-CM | POA: Diagnosis not present

## 2020-07-28 DIAGNOSIS — I639 Cerebral infarction, unspecified: Secondary | ICD-10-CM | POA: Diagnosis not present

## 2020-07-28 LAB — RENAL FUNCTION PANEL
Albumin: 3.2 g/dL — ABNORMAL LOW (ref 3.5–5.0)
Anion gap: 15 (ref 5–15)
BUN: 19 mg/dL (ref 8–23)
CO2: 24 mmol/L (ref 22–32)
Calcium: 9 mg/dL (ref 8.9–10.3)
Chloride: 93 mmol/L — ABNORMAL LOW (ref 98–111)
Creatinine, Ser: 3.2 mg/dL — ABNORMAL HIGH (ref 0.44–1.00)
GFR, Estimated: 14 mL/min — ABNORMAL LOW (ref >60)
Glucose, Bld: 129 mg/dL — ABNORMAL HIGH (ref 70–99)
Phosphorus: 3 mg/dL (ref 2.5–4.6)
Potassium: 3.5 mmol/L (ref 3.5–5.1)
Sodium: 132 mmol/L — ABNORMAL LOW (ref 135–145)

## 2020-07-28 LAB — CBC
HCT: 33.7 % — ABNORMAL LOW (ref 36.0–46.0)
Hemoglobin: 10.8 g/dL — ABNORMAL LOW (ref 12.0–15.0)
MCH: 28.6 pg (ref 26.0–34.0)
MCHC: 32 g/dL (ref 30.0–36.0)
MCV: 89.4 fL (ref 80.0–100.0)
Platelets: 198 10*3/uL (ref 150–400)
RBC: 3.77 MIL/uL — ABNORMAL LOW (ref 3.87–5.11)
RDW: 16.8 % — ABNORMAL HIGH (ref 11.5–15.5)
WBC: 5.5 10*3/uL (ref 4.0–10.5)
nRBC: 0 % (ref 0.0–0.2)

## 2020-07-28 LAB — GLUCOSE, CAPILLARY
Glucose-Capillary: 126 mg/dL — ABNORMAL HIGH (ref 70–99)
Glucose-Capillary: 258 mg/dL — ABNORMAL HIGH (ref 70–99)
Glucose-Capillary: 221 mg/dL — ABNORMAL HIGH (ref 70–99)

## 2020-07-28 MED ORDER — INSULIN ASPART 100 UNIT/ML ~~LOC~~ SOLN
0.0000 [IU] | Freq: Three times a day (TID) | SUBCUTANEOUS | Status: DC
  Administered 2020-07-28: 2 [IU] via SUBCUTANEOUS
  Administered 2020-07-28: 8 [IU] via SUBCUTANEOUS
  Administered 2020-07-29: 3 [IU] via SUBCUTANEOUS
  Administered 2020-07-29: 2 [IU] via SUBCUTANEOUS
  Administered 2020-07-29: 3 [IU] via SUBCUTANEOUS
  Administered 2020-07-30: 5 [IU] via SUBCUTANEOUS
  Administered 2020-07-30: 2 [IU] via SUBCUTANEOUS
  Administered 2020-07-30: 5 [IU] via SUBCUTANEOUS

## 2020-07-28 MED ORDER — HEPARIN SODIUM (PORCINE) 1000 UNIT/ML IJ SOLN
INTRAMUSCULAR | Status: AC
  Administered 2020-07-28: 3500 [IU] via INTRAVENOUS
  Filled 2020-07-28: qty 4

## 2020-07-28 MED ORDER — LETROZOLE 2.5 MG PO TABS
2.5000 mg | ORAL_TABLET | Freq: Every day | ORAL | Status: DC
  Administered 2020-07-28 – 2020-07-30 (×3): 2.5 mg via ORAL
  Filled 2020-07-28 (×4): qty 1

## 2020-07-28 MED ORDER — HEPARIN SODIUM (PORCINE) 1000 UNIT/ML IJ SOLN: 3500.0000 [IU] | Freq: Once | INTRAMUSCULAR | Status: AC

## 2020-07-28 NOTE — Progress Notes (Addendum)
Physical Therapy Treatment Patient Details Name: [name] MRN: [id number] DOB: 02/27/1941 Today's Date: 07/28/2020    History of Present Illness Pt is a 80 y.o. female with initial COVID dx on 07/07/20 and underwent antibody infusion, admitted 07/12/20 as code stroke with L-side facial droop and flaccid paralysis. Workup revealed 7 mm hemorrhage in R basal ganglia; not a tPA or IR candidate. Pt also with new onset PAF, GIB. S/p EGD 12/23. PMH includes CA, DM, ESRD (HD MWF).   PT Comments    Pt progressing well with mobility. Today's session focused on gait training and LE strengthening; pt remains limited by apparent motor coordination deficits, cognitive impairment and BLE weakness. Pt motivated to participate and regain PLOF. Continue to recommend SNF-level therapies to maximize functional mobility and independence prior to return home.  SpO2 98% on RA with activity    Follow Up Recommendations  SNF;Supervision for mobility/OOB     Equipment Recommendations  None recommended by PT (owns RW)    Recommendations for Other Services       Precautions / Restrictions Precautions Precautions: Fall Restrictions Weight Bearing Restrictions: No    Mobility  Bed Mobility               General bed mobility comments: Received sitting EOB finishing lunch  Transfers Overall transfer level: Needs assistance Equipment used: Rolling walker (2 wheeled) Transfers: Sit to/from Stand Sit to Stand: Min assist;Min guard         General transfer comment: Requesting use of RW; initial minA to assist trunk elevation and maintain stability, cues for sequencing; additional repeated sit<>stands with min guard, heavy reliance on UE support  Ambulation/Gait Ambulation/Gait assistance: Min guard;Min assist;Mod assist Gait Distance (Feet): 84 Feet Assistive device: Rolling walker (2 wheeled) Gait Pattern/deviations: Narrow base of support;Step-through pattern;Drifts right/left;Decreased  stride length Gait velocity: Decreased Gait velocity interpretation: <1.31 ft/sec, indicative of household ambulator General Gait Details: Slow, mildly unsteady gait with RW and min guard for balance; pt consistently veering towards R-side and running into objects with RW, able to self-correct with increased time and effort; quick to fatigue requiring minA for stability; 1x LOB requiring modA to prevent fall   Stairs             Wheelchair Mobility    Modified Rankin (Stroke Patients Only) Modified Rankin (Stroke Patients Only) Pre-Morbid Rankin Score: No significant disability Modified Rankin: Moderately severe disability     Balance                                            Cognition Arousal/Alertness: Awake/alert Behavior During Therapy: Flat affect Overall Cognitive Status: Impaired/Different from baseline Area of Impairment: Attention;Following commands;Safety/judgement;Awareness;Problem solving                   Current Attention Level: Selective   Following Commands: Follows multi-step commands with increased time;Follows one step commands consistently Safety/Judgement: Decreased awareness of safety;Decreased awareness of deficits Awareness: Emergent Problem Solving: Slow processing;Requires verbal cues;Difficulty sequencing General Comments: Persistent flat affect, although pt feels her cognition improving, states, "I still have trouble thinking through things sometimes, but overall I think it's getting better." Demonstrates insight into deficits during session without cues (i.e. "I'm running into things on this side")      Exercises Other Exercises Other Exercises: 5x sit<>stand + 5x sit<>stand with seated rest between trials; heavy reliance  on UE support, improving eccentric control Other Exercises: Seated ankle pumps, LAQ, seated marching; legs elevated, pt able to perform partial SLR (not full ROM), unable to perform complete heel  slide    General Comments General comments (skin integrity, edema, etc.): Visual field testing WFL; hip flexion strength </ 3/5, still with R-side hip discomfort aggravated by seated hip flexor MMT      Pertinent Vitals/Pain Pain Assessment: Faces Faces Pain Scale: Hurts a little bit Pain Location: R hip Pain Descriptors / Indicators: Discomfort;Guarding Pain Intervention(s): Monitored during session    Home Living                      Prior Function            PT Goals (current goals can now be found in the care plan section) Acute Rehab PT Goals Patient Stated Goal: to "get back to living" PT Goal Formulation: With patient Time For Goal Achievement: 08/11/20 Potential to Achieve Goals: Good Progress towards PT goals: Progressing toward goals    Frequency    Min 3X/week      PT Plan Current plan remains appropriate    Co-evaluation              AM-PAC PT "6 Clicks" Mobility   Outcome Measure  Help needed turning from your back to your side while in a flat bed without using bedrails?: A Little Help needed moving from lying on your back to sitting on the side of a flat bed without using bedrails?: A Little Help needed moving to and from a bed to a chair (including a wheelchair)?: A Little Help needed standing up from a chair using your arms (e.g., wheelchair or bedside chair)?: A Little Help needed to walk in hospital room?: A Lot Help needed climbing 3-5 steps with a railing? : A Lot 6 Click Score: 16    End of Session Equipment Utilized During Treatment: Gait belt Activity Tolerance: Patient tolerated treatment well Patient left: in chair;with call bell/phone within reach;with chair alarm set Nurse Communication: Mobility status PT Visit Diagnosis: Other abnormalities of gait and mobility (R26.89);Other symptoms and signs involving the nervous system (R29.898)     Time: 2025-4270 PT Time Calculation (min) (ACUTE ONLY): 25 min  Charges:   $Gait Training: 8-22 mins $Therapeutic Exercise: 8-22 mins                     Mabeline Caras, PT, DPT Acute Rehabilitation Services  Pager [phone] Office [phone]  Derry Lory 07/28/2020, 2:54 PM

## 2020-07-28 NOTE — Progress Notes (Signed)
PT Cancellation Note  Patient Details Name: [name] MRN: [id number] DOB: [birth year]   Cancelled Treatment:    Reason Eval/Treat Not Completed: Patient at procedure or test/unavailable. Will follow-up for PT treatment as schedule permits.  Mabeline Caras, PT, DPT Acute Rehabilitation Services  Pager [phone] Office [phone]  [name] 07/28/2020, 7:59 AM

## 2020-07-28 NOTE — Progress Notes (Signed)
Ok to increase SSI to moderate scale per Dr. Waldron Labs.  Onnie Boer, PharmD, BCIDP, AAHIVP, CPP Infectious Disease Pharmacist 07/28/2020 10:47 AM

## 2020-07-28 NOTE — Progress Notes (Signed)
Patient will have a MWF schedule at HD clinic/[place] with a seat time of 12:05pm. She should arrive to her appointments at 11:45am. TOC CSW updated. Renal Navigator has also updated Nephrology team and will continue to follow.  Alphonzo Cruise, [place] Renal Navigator [phone]

## 2020-07-28 NOTE — TOC Progression Note (Addendum)
Transition of Care [facility]) - Progression Note    Patient Details  Name: [name] MRN: [id number] Date of Birth: [birth year]  Transition of Care [facility]) CM/SW [place], [place] Phone Number: 07/28/2020, 9:16 AM  Clinical Narrative:    CSW contacted [facility], Maudie Mercury, and they are starting insurance authorization.   Update: Their building does not have power, but will keep CSW posted.      Barriers to Discharge: Continued Medical Work up,SNF Pending bed offer,Insurance Authorization  Expected Discharge Plan and Services   In-house Referral: Clinical Social Work     Living arrangements for the past 2 months: Single Family Home Expected Discharge Date: 07/22/20                                     Social Determinants of Health (SDOH) Interventions    Readmission Risk Interventions No flowsheet data found.

## 2020-07-28 NOTE — Progress Notes (Signed)
PROGRESS NOTE                                                                                                                                                                                                             Patient Demographics:    [name], is a 80 y.o. female, DOB - March 22, 1941, YYT:[id number]  Outpatient Primary MD for the patient is [name], MD    LOS - 16  Admit date - 07/12/2020    Chief Complaint  Patient presents with  . Code Stroke  . GI Bleeding       Brief Narrative (HPI from H&P) - this is a 80 year old female with history of hypertension, lateral hip fractures, right-sided breast cancer s/p treatment including lumpectomy about 5 years ago, ESRD, dyslipidemia, DM type II, A. fib who was brought in the hospital on 07/12/2020 with altered mental status, was found to have ischemic stroke with hemorrhagic conversion, her stay was complicated by GI bleed and COVID-19 infection.  She was initially admitted under PCCM and was transferred to hospitalist service on 07/15/2020.  She has been seen so far by nephrology, neurology and PCCM.   Subjective:   Patient seen and examined.  She denies any complaints today, no chest pain, no shortness of breath,.   Assessment  & Plan :    Acute right basal ganglia hemorrhagic stroke- with minimal left-sided weakness. - This appears to be ischemic infarct with hemorrhagic conversion due to A. Fib.  -  Seen by stroke team, initially on aspirin until she is cleared by GI, then she has been started on Eliquis, will discontinue aspirin. -  LDL was within acceptable limits, echocardiogram nonacute, A1c slightly elevated  - will be discharged to SNF.   A. fib RVR.  -  Likely found this admission.  She is currently in and out of A. fib and RVR, have started her on Cardizem, amiodarone added by cardiology, stable TSH & echocardiogram noted with preserved EF.    On  Eliquis now outpatient cardiology follow-up in 1 to 2 weeks post discharge.  Acute GI bleed with blood loss related anemia.  - Appears to be upper GI.  S/p packed RBC transfusion on 07/12/2020 1 unit, monitor H&H, PPI, GI on board and she underwent EGD on 07/17/2020, significant for duodenitis and clear base duodenal ulcer,  not as increased risk for recurrent bleed, so started on Eliquis . -Continue with PPI.  ESRD.  On MWF schedule.    Neurology following continue outpatient regimen post discharge.  Dyslipidemia.  On statin.   Essential hypertension.  Very well controlled.  Continue diltiazem and hydralazine as well as as needed metoprolol.     Mild metabolic encephalopathy.  She is completely alert and oriented at this point in time.   COVID-19 infection diagnosed on 07/07/2020.  S/p antibody infusion.  Monitor.     History of right-sided breast cancer.  Outpatient follow-up with oncology doses 23-year-old problem.     Hip pain.  Chronic.  CT scan stable.  Monitor with supportive care.  PT OT.    Stable.   DM type II.  Hemoglobin A1c 7.9 on 07/13/2020.  On glipizide at home which is on hold.  Improved on 30 units of Lantus, will increase sliding scale to moderate.  Lab Results  Component Value Date   HGBA1C 7.9 (H) 07/13/2020    CBG (last 3)  Recent Labs    07/27/20 1738 07/27/20 2101 07/28/20 1147  GLUCAP 180* 182* 126*    Lab Results  Component Value Date   CHOL 99 07/14/2020   HDL 31 (L) 07/14/2020   LDLCALC 32 07/14/2020   TRIG 178 (H) 07/14/2020   CHOLHDL 3.2 07/14/2020    Lab Results  Component Value Date   HGBA1C 7.9 (H) 07/13/2020      Lab Results  Component Value Date   TSH 1.219 07/16/2020        Condition -stable  Family Communication  :   Patient alert and oriented.  Code Status :  Full  Consults  : Neurology, PCCM, nephrology, cardiology  Procedures  :    EGD 12/23 - gastritis and duodenitis.  CT HEAD  07/12/2020  -   Acute  7 mm hemorrhage within the right basal ganglia. No substantial mass effect. Chronic microvascular ischemic disease and remote right cerebellar lacunar infarct.   CT HEAD WO CONTRAST - 07/13/2020 expected evolutionary changes in the acute right basal ganglia hemorrhage overall similar in size with slightly decreased density compared to previous CT.  No new abnormality.  MRI / MRA Head - unchanged 1 cm right posterior lentiform nucleus/internal capsule hemorrhage without significant surrounding edema or mass-effect.  4 mm diffusion abnormality involving subcortical white matter in the right parietal corona radiata consistent with acute small vessel type infarct.    MRA of the brain technically limited due to motion artifact but no large vessel occlusion.  Focal high-grade stenosis of the right distal M1 segment and distal left M1 segment bifurcation.  Severe multifocal bilateral P2 and distal left P3 stenosis.  Transthoracic Echocardiogram - left ventricular ejection fraction 60 to 65%.  00/00/2021  Bilateral Carotid Dopplers - bilateral 1-39% stenosis of carotids.    PUD Prophylaxis : PPI  Disposition Plan  :    Status is: Inpatient  Remains inpatient appropriate because:IV treatments appropriate due to intensity of illness or inability to take PO   Dispo: The patient is from: Home              Anticipated d/c is to: SNF              Anticipated d/c date is: 1 day              Patient currently is medically stable to d/c.   DVT Prophylaxis  :   Eliquis  Lab Results  Component Value Date   PLT 198 07/28/2020    Diet :  Diet Order            DIET SOFT Room service appropriate? Yes; Fluid consistency: Thin  Diet effective now                  Inpatient Medications  Scheduled Meds: . amiodarone  200 mg Oral Daily  . apixaban  2.5 mg Oral BID  . atorvastatin  20 mg Oral QHS  . chlorhexidine  15 mL Mouth Rinse BID  . Chlorhexidine Gluconate Cloth  6 each Topical Daily   . diltiazem  120 mg Oral Daily  . hydrALAZINE  25 mg Oral Q8H  . insulin aspart  0-15 Units Subcutaneous TID WC  . insulin aspart  0-5 Units Subcutaneous QHS  . insulin glargine  30 Units Subcutaneous Daily  . letrozole  2.5 mg Oral Daily  . mouth rinse  15 mL Mouth Rinse q12n4p  . midodrine  10 mg Oral Once  . pantoprazole  40 mg Oral BID   Continuous Infusions: . sodium chloride Stopped (07/17/20 1515)   PRN Meds:.sodium chloride, docusate sodium, guaiFENesin-dextromethorphan, heparin sodium (porcine), metoprolol tartrate, ondansetron (ZOFRAN) IV, polyethylene glycol, sodium chloride flush  Antibiotics  :    Anti-infectives (From admission, onward)   None      Phillips Climes M.D on 07/28/2020 at 1:17 PM  To page go to [email or web address]   [facility] -  Office  [phone]   See all Orders from today for further details    Objective:   Vitals:   07/28/20 1000 07/28/20 1030 07/28/20 1100 07/28/20 1118  BP: (!) 116/46 (!) 124/58 108/65 133/69  Pulse: 71 70 71 70  Resp:    18  Temp:    97.8 F (36.6 C)  TempSrc:    Oral  SpO2:    97%  Weight:    73 kg  Height:        Wt Readings from Last 3 Encounters:  07/28/20 73 kg  04/21/20 77.9 kg  04/20/19 90.2 kg     Intake/Output Summary (Last 24 hours) at 07/28/2020 1317 Last data filed at 07/28/2020 1118 Gross per 24 hour  Intake --  Output 2000 ml  Net -2000 ml     Physical Exam  Seen during hemodialysis, awake Alert, Oriented X 3, No new F.N deficits, Normal affect Symmetrical Chest wall movement, Good air movement bilaterally, CTAB RRR, +ve B.Sounds No Cyanosis, Clubbing or edema, No new Rash or bruise     Data Review:    CBC Recent Labs  Lab 07/22/20 0240 07/23/20 0658 07/25/20 0924 07/27/20 0225 07/28/20 1212  WBC 5.5 6.5 4.6 5.1 5.5  HGB 10.1* 10.3* 9.2* 9.7* 10.8*  HCT 32.2* 32.5* 29.7* 29.2* 33.7*  PLT 181 216 203 207 198  MCV 91.2 90.5 92.5 90.1 89.4  MCH 28.6 28.7 28.7 29.9 28.6   MCHC 31.4 31.7 31.0 33.2 32.0  RDW 16.8* 16.8* 16.6* 16.9* 16.8*  LYMPHSABS 0.9  --   --   --   --   MONOABS 0.8  --   --   --   --   EOSABS 0.1  --   --   --   --   BASOSABS 0.0  --   --   --   --     Recent Labs  Lab 07/23/20 0658 07/25/20 0924 07/27/20 0225 07/28/20 1212  NA 132* 133* 134* 132*  K 4.4 4.1  4.5 3.5  CL 95* 95* 96* 93*  CO2 23 24 23 24   GLUCOSE 182* 145* 232* 129*  BUN 43* 44* 43* 19  CREATININE 5.99* 5.88* 6.09* 3.20*  CALCIUM 8.9 8.7* 9.3 9.0  ALBUMIN 3.0* 2.8* 2.8* 3.2*    ------------------------------------------------------------------------------------------------------------------ No results for input(s): CHOL, HDL, LDLCALC, TRIG, CHOLHDL, LDLDIRECT in the last 72 hours.  Lab Results  Component Value Date   HGBA1C 7.9 (H) 07/13/2020   ------------------------------------------------------------------------------------------------------------------ No results for input(s): TSH, T4TOTAL, T3FREE, THYROIDAB in the last 72 hours.  Invalid input(s): FREET3  Cardiac Enzymes No results for input(s): CKMB, TROPONINI, MYOGLOBIN in the last 168 hours.  Invalid input(s): CK ------------------------------------------------------------------------------------------------------------------    Component Value Date/Time   BNP 113.8 (H) 07/19/2020 0200    Micro Results Recent Results (from the past 240 hour(s))  SARS CORONAVIRUS 2 (TAT 6-24 HRS) Nasopharyngeal Nasopharyngeal Swab     Status: Abnormal   Collection Time: 07/21/20  1:34 PM   Specimen: Nasopharyngeal Swab  Result Value Ref Range Status   SARS Coronavirus 2 POSITIVE (A) NEGATIVE Final    Comment: RESULT CALLED TO, READ BACK BY AND VERIFIED WITH: F.AFOR,RN @2142  07/21/2020 VANG.J (NOTE) SARS-CoV-2 target nucleic acids are DETECTED.  The SARS-CoV-2 RNA is generally detectable in upper and lower respiratory specimens during the acute phase of infection. Positive results are indicative of  the presence of SARS-CoV-2 RNA. Clinical correlation with patient history and other diagnostic information is  necessary to determine patient infection status. Positive results do not rule out bacterial infection or co-infection with other viruses.  The expected result is Negative.  Fact Sheet for Patients: SugarRoll.be  Fact Sheet for Healthcare Providers: https://www.woods-mathews.com/  This test is not yet approved or cleared by the [place] FDA and  has been authorized for detection and/or diagnosis of SARS-CoV-2 by FDA under an Emergency Use Authorization (EUA). This EUA will remain  in effect (meaning this test can be used) f or the duration of the COVID-19 declaration under Section 564(b)(1) of the Act, 21 U.S.C. section 360bbb-3(b)(1), unless the authorization is terminated or revoked sooner.   Performed at [facility], [place] [address] [place], [place] [address]     Radiology Reports DG Chest 1 View  Result Date: 07/12/2020 CLINICAL DATA:  Patient with history of COVID-19. History of GI bleed. EXAM: CHEST  1 VIEW COMPARISON:  Chest radiograph 07/10/2020. FINDINGS: Stable right-sided dialysis catheter. Monitoring leads overlie the patient. Stable cardiomegaly. Aortic atherosclerosis. Site oval increased patchy opacities left mid lower lung. No definite pleural effusion or pneumothorax. Thoracic spine degenerative changes. Surgical clips right axilla. IMPRESSION: Increasing patchy opacities left mid and lower lung may represent infection in the appropriate clinical setting or potentially atelectasis. Electronically Signed   By: Lovey Newcomer M.D.   On: 07/12/2020 11:24   CT HEAD WO CONTRAST  Result Date: 07/16/2020 CLINICAL DATA:  Stroke follow-up. EXAM: CT HEAD WITHOUT CONTRAST TECHNIQUE: Contiguous axial images were obtained from the base of the skull through the vertex without intravenous contrast. COMPARISON:  07/13/2020  head CT and MRI FINDINGS: Brain: Approximately 1 cm hyperdense focus at the posterior aspect of the right lentiform nucleus corresponding to the previously demonstrated hemorrhage is unchanged in size. There is no associated edema. No new intracranial hemorrhage, acute large territory infarct, mass, midline shift, or extra-axial fluid collection is identified. Mild cerebral atrophy is within normal limits for age. Hypodensities in the cerebral white matter bilaterally are unchanged and nonspecific but compatible with mild  to moderate chronic small vessel ischemic disease. Vascular: Calcified atherosclerosis at the skull base. Skull: No fracture or suspicious osseous lesion. Sinuses/Orbits: Moderate mucosal thickening in the frontal sinuses. Small right mastoid effusion. Bilateral cataract extraction. Other: None. IMPRESSION: 1. Unchanged 1 cm right basal ganglia hemorrhage. 2. No new intracranial abnormality. Electronically Signed   By: Logan Bores M.D.   On: 07/16/2020 15:15   CT HEAD WO CONTRAST  Result Date: 07/13/2020 CLINICAL DATA:  Follow-up examination of basal ganglia hemorrhage. EXAM: CT HEAD WITHOUT CONTRAST TECHNIQUE: Contiguous axial images were obtained from the base of the skull through the vertex without intravenous contrast. COMPARISON:  Prior CT from 07/12/2020 FINDINGS: Brain: Previously identified intraparenchymal hemorrhage at the posterior right lentiform nucleus again seen. Bleed has mildly dispersed and is less dense in appearance as compared to previous exam, measuring overall similar in size at 10 x 7 x 10 mm, previously 10 x 5 x 10 mm when measured in a similar fashion. No significant surrounding edema or regional mass effect. No other acute or interval intracranial hemorrhage. No other acute large vessel territory infarct. No mass lesion, mass effect, or midline shift. No hydrocephalus or extra-axial fluid collection. Atrophy with chronic microvascular ischemic disease again noted.  Vascular: No hyperdense vessel. Calcified atherosclerosis at the skull base. Skull: Scalp soft tissues and calvarium within normal limits. Sinuses/Orbits: Globes and orbital soft tissues demonstrate no acute finding. Mild mucoperiosteal thickening noted throughout the visualized paranasal sinuses. Mastoid air cells remain clear. Other: None. IMPRESSION: 1. Normal expected interval evolution of acute intraparenchymal hemorrhage positioned at the posterior right lentiform nucleus, overall similar in size but with slightly decreased density as compared to previous. No significant surrounding edema or regional mass effect. 2. No other new acute intracranial abnormality. 3. Atrophy with chronic microvascular ischemic disease. Electronically Signed   By: Jeannine Boga M.D.   On: 07/13/2020 18:45   CT PELVIS WO CONTRAST  Result Date: 07/14/2020 CLINICAL DATA:  Right hip pain EXAM: CT PELVIS WITHOUT CONTRAST TECHNIQUE: Multidetector CT imaging of the pelvis was performed following the standard protocol without intravenous contrast. COMPARISON:  Radiographs 07/12/2020 FINDINGS: Urinary Tract: Bladder and distal ureters obscured by streak artifact from the patient's bilateral hip hardware. Bowel:  Unremarkable, rectum partially obscured by streak artifact. Vascular/Lymphatic: Aortoiliac atherosclerotic vascular disease. No pathologic adenopathy identified. Reproductive:  Unremarkable Other:  No supplemental non-categorized findings. Musculoskeletal: Right hip screw with IM nail. Single distal interlocking screw along the IM nail. Inter trochanteric deformity from old fracture, no acute fracture is identified. Severe degenerative right hip arthropathy with loss of articular space and spurring. Left hip hemiarthroplasty without discrete complicating feature identified. Mild spurring along the SI joints. There is lower lumbar spondylosis and degenerative disc disease leading to moderate left foraminal stenosis at  L5-S1. IMPRESSION: 1. Severe degenerative right hip arthropathy. 2. Right hip screw with IM nail. Right intertrochanteric deformity from old fracture. No acute fracture is identified. 3. Left hip hemiarthroplasty without discrete complicating feature identified. 4. Lower lumbar spondylosis and degenerative disc disease leading to moderate left foraminal stenosis at L5-S1. 5. Aortic atherosclerosis. Aortic Atherosclerosis (ICD10-I70.0). Electronically Signed   By: Van Clines M.D.   On: 07/14/2020 14:12   MR ANGIO HEAD WO CONTRAST  Result Date: 07/13/2020 CLINICAL DATA:  Follow-up examination for acute stroke. EXAM: MRI HEAD WITHOUT CONTRAST MRA HEAD WITHOUT CONTRAST TECHNIQUE: Multiplanar, multiecho pulse sequences of the brain and surrounding structures were obtained without intravenous contrast. Angiographic images of the head were obtained  using MRA technique without contrast. COMPARISON:  Prior head CT from 07/12/2020 as well as previous brain MRI from 08/17/2016. FINDINGS: MRI HEAD FINDINGS Brain: Diffuse prominence of the CSF containing spaces compatible with generalized age-related cerebral atrophy. Patchy and confluent T2/FLAIR hyperintensity within the periventricular and deep white matter both cerebral hemispheres most likely related chronic microvascular ischemic disease. Patchy involvement of the pons. Overall, these changes are mildly progressed as compared to 2018. Few small remote lacunar type infarcts noted about the basal ganglia, most notable which present at the right caudothalamic groove. Previously identified hemorrhage centered at the posterior right lentiform nucleus/right internal capsule again seen, stable in size measuring up to approximately 1 cm in greatest dimension. This is somewhat difficult to visualized by MR, and is most notable on T1 weighted sequence (series 16, image 26). No significant surrounding edema or regional mass effect. No other acute intracranial hemorrhage.  Few scattered chronic micro hemorrhages noted at the right cerebellum and pons, likely small vessel/hypertensive in nature. Single punctate 4 mm focus of diffusion abnormality involving the subcortical white matter of the right parietal corona radiata noted, likely a small acute to subacute small vessel type ischemic infarct (series 5, image 74). No associated hemorrhage or mass effect. No other diffusion abnormality to suggest acute or subacute ischemia. Gray-white matter differentiation otherwise maintained. No mass lesion, mass effect, or midline shift. No hydrocephalus or extra-axial fluid collection. Pituitary gland suprasellar region normal. Midline structures intact. 1.3 cm ovoid well-circumscribed lesion positioned at the left petrous apex demonstrating heterogeneous T2/FLAIR signal intensity with hyperintense T1 signal intensity noted (series 16, image 14), nonspecific, but could reflect a small cholesterol granuloma versus asymmetric fatty marrow. Finding is relatively stable from previous MRI from 2018, and of doubtful significance. Vascular: Major intracranial vascular flow voids are maintained. Skull and upper cervical spine: Craniocervical junction within normal limits. Multilevel degenerative spondylosis noted within the visualized upper cervical spine with resultant mild-to-moderate spinal stenosis. Bone marrow signal intensity within normal limits. No scalp soft tissue abnormality. Sinuses/Orbits: Patient status post bilateral ocular lens replacement. Globes and orbital soft tissues demonstrate no acute finding. Mild scattered mucosal thickening noted throughout the paranasal sinuses. Trace bilateral mastoid effusions, of doubtful significance. Visualized nasopharynx within normal limits. Other: None. MRA HEAD FINDINGS ANTERIOR CIRCULATION: Examination degraded by motion artifact. Visualized distal cervical segments of the internal carotid arteries are patent with symmetric antegrade flow. Petrous  segments widely patent. Atheromatous irregularity throughout the carotid siphons with associated mild multifocal narrowing. A1 segments patent bilaterally. Normal anterior communicating artery complex. Anterior cerebral arteries patent to their distal aspects without high-grade stenosis. Short-segment severe stenosis at the distal left M1 segment/left MCA bifurcation (series 1036, image 12). Left MCA branches are diffusely irregular but perfused distally. Right M1 segment patent proximally. Focal signal cutoff at the mid-distal right M1 segment into the bifurcation (series 1036, image 11). This likely reflects a severe high-grade stenosis, as the right MCA branches are perfused distally. Diffuse atheromatous irregularity seen throughout the right MCA branches as well. Proximally. POSTERIOR CIRCULATION: Vertebral arteries largely code dominant and patent to the vertebrobasilar junction. Both PICA origins patent and normal. Basilar patent to its distal aspect without stenosis. Superior cerebral arteries patent bilaterally. Both PCAs primarily supplied via the basilar. Severe long segment stenosis involving the mid-distal right P2 segment (series 1046, image 15). Severe multifocal distal left P2 and P3 stenoses present as well (series 1046, image 14). PCAs remain perfused to their distal aspects. No intracranial aneurysm or other vascular  abnormality. IMPRESSION: MRI HEAD IMPRESSION: 1. Unchanged 1 cm hemorrhage centered at the posterior right lentiform nucleus/right internal capsule. No significant surrounding edema or regional mass effect. 2. 4 mm focus of diffusion abnormality involving the subcortical white matter of the right parietal corona radiata, consistent with a small acute to subacute small vessel type ischemic infarct. No associated hemorrhage. 3. Underlying age-related cerebral atrophy with moderate chronic microvascular ischemic disease, mildly progressed as compared to 2018. MRA HEAD IMPRESSION: 1.  Technically limited exam due to motion artifact. 2. Negative intracranial MRA for large vessel occlusion. 3. Focal severe high-grade stenoses involving the mid-distal right M1 segment and distal left M1 segment/left MCA bifurcation. Left MCA branches remain perfused distally. 4. Severe multifocal bilateral P2 and distal left P3 stenoses as above. Electronically Signed   By: Jeannine Boga M.D.   On: 07/13/2020 19:16   MR BRAIN WO CONTRAST  Result Date: 07/13/2020 CLINICAL DATA:  Follow-up examination for acute stroke. EXAM: MRI HEAD WITHOUT CONTRAST MRA HEAD WITHOUT CONTRAST TECHNIQUE: Multiplanar, multiecho pulse sequences of the brain and surrounding structures were obtained without intravenous contrast. Angiographic images of the head were obtained using MRA technique without contrast. COMPARISON:  Prior head CT from 07/12/2020 as well as previous brain MRI from 08/17/2016. FINDINGS: MRI HEAD FINDINGS Brain: Diffuse prominence of the CSF containing spaces compatible with generalized age-related cerebral atrophy. Patchy and confluent T2/FLAIR hyperintensity within the periventricular and deep white matter both cerebral hemispheres most likely related chronic microvascular ischemic disease. Patchy involvement of the pons. Overall, these changes are mildly progressed as compared to 2018. Few small remote lacunar type infarcts noted about the basal ganglia, most notable which present at the right caudothalamic groove. Previously identified hemorrhage centered at the posterior right lentiform nucleus/right internal capsule again seen, stable in size measuring up to approximately 1 cm in greatest dimension. This is somewhat difficult to visualized by MR, and is most notable on T1 weighted sequence (series 16, image 26). No significant surrounding edema or regional mass effect. No other acute intracranial hemorrhage. Few scattered chronic micro hemorrhages noted at the right cerebellum and pons, likely small  vessel/hypertensive in nature. Single punctate 4 mm focus of diffusion abnormality involving the subcortical white matter of the right parietal corona radiata noted, likely a small acute to subacute small vessel type ischemic infarct (series 5, image 74). No associated hemorrhage or mass effect. No other diffusion abnormality to suggest acute or subacute ischemia. Gray-white matter differentiation otherwise maintained. No mass lesion, mass effect, or midline shift. No hydrocephalus or extra-axial fluid collection. Pituitary gland suprasellar region normal. Midline structures intact. 1.3 cm ovoid well-circumscribed lesion positioned at the left petrous apex demonstrating heterogeneous T2/FLAIR signal intensity with hyperintense T1 signal intensity noted (series 16, image 14), nonspecific, but could reflect a small cholesterol granuloma versus asymmetric fatty marrow. Finding is relatively stable from previous MRI from 2018, and of doubtful significance. Vascular: Major intracranial vascular flow voids are maintained. Skull and upper cervical spine: Craniocervical junction within normal limits. Multilevel degenerative spondylosis noted within the visualized upper cervical spine with resultant mild-to-moderate spinal stenosis. Bone marrow signal intensity within normal limits. No scalp soft tissue abnormality. Sinuses/Orbits: Patient status post bilateral ocular lens replacement. Globes and orbital soft tissues demonstrate no acute finding. Mild scattered mucosal thickening noted throughout the paranasal sinuses. Trace bilateral mastoid effusions, of doubtful significance. Visualized nasopharynx within normal limits. Other: None. MRA HEAD FINDINGS ANTERIOR CIRCULATION: Examination degraded by motion artifact. Visualized distal cervical segments of the internal  carotid arteries are patent with symmetric antegrade flow. Petrous segments widely patent. Atheromatous irregularity throughout the carotid siphons with  associated mild multifocal narrowing. A1 segments patent bilaterally. Normal anterior communicating artery complex. Anterior cerebral arteries patent to their distal aspects without high-grade stenosis. Short-segment severe stenosis at the distal left M1 segment/left MCA bifurcation (series 1036, image 12). Left MCA branches are diffusely irregular but perfused distally. Right M1 segment patent proximally. Focal signal cutoff at the mid-distal right M1 segment into the bifurcation (series 1036, image 11). This likely reflects a severe high-grade stenosis, as the right MCA branches are perfused distally. Diffuse atheromatous irregularity seen throughout the right MCA branches as well. Proximally. POSTERIOR CIRCULATION: Vertebral arteries largely code dominant and patent to the vertebrobasilar junction. Both PICA origins patent and normal. Basilar patent to its distal aspect without stenosis. Superior cerebral arteries patent bilaterally. Both PCAs primarily supplied via the basilar. Severe long segment stenosis involving the mid-distal right P2 segment (series 1046, image 15). Severe multifocal distal left P2 and P3 stenoses present as well (series 1046, image 14). PCAs remain perfused to their distal aspects. No intracranial aneurysm or other vascular abnormality. IMPRESSION: MRI HEAD IMPRESSION: 1. Unchanged 1 cm hemorrhage centered at the posterior right lentiform nucleus/right internal capsule. No significant surrounding edema or regional mass effect. 2. 4 mm focus of diffusion abnormality involving the subcortical white matter of the right parietal corona radiata, consistent with a small acute to subacute small vessel type ischemic infarct. No associated hemorrhage. 3. Underlying age-related cerebral atrophy with moderate chronic microvascular ischemic disease, mildly progressed as compared to 2018. MRA HEAD IMPRESSION: 1. Technically limited exam due to motion artifact. 2. Negative intracranial MRA for large  vessel occlusion. 3. Focal severe high-grade stenoses involving the mid-distal right M1 segment and distal left M1 segment/left MCA bifurcation. Left MCA branches remain perfused distally. 4. Severe multifocal bilateral P2 and distal left P3 stenoses as above. Electronically Signed   By: Jeannine Boga M.D.   On: 07/13/2020 19:16   DG Pelvis Portable  Result Date: 07/12/2020 CLINICAL DATA:  Right hip pain.  No reported injury. EXAM: PORTABLE PELVIS 1-2 VIEWS COMPARISON:  07/21/2018 outside pelvic radiographs FINDINGS: Partially visualized left hip hemiarthroplasty and partially visualized fixation hardware in the proximal right femur with no evidence of hardware fracture or loosening. No evidence of hip dislocation on this single frontal view. No osseous fracture. No focal osseous lesions. Moderate right hip osteoarthritis. No suspicious focal osseous lesions. No pelvic diastasis. IMPRESSION: No acute osseous abnormality. No evidence of hip dislocation on this single frontal view. Moderate right hip osteoarthritis. Partially visualized left hip hemiarthroplasty and proximal right femur fixation hardware, with no evidence of hardware complication. Electronically Signed   By: Ilona Sorrel M.D.   On: 07/12/2020 12:33   DG CHEST PORT 1 VIEW  Result Date: 07/15/2020 CLINICAL DATA:  COVID-19. EXAM: PORTABLE CHEST 1 VIEW COMPARISON:  07/12/2020. FINDINGS: Right dual-lumen catheter stable position. Stable cardiomegaly. Progressive bilateral pulmonary infiltrates/edema. Tiny bilateral pleural effusions cannot be excluded. No pneumothorax. Degenerative change thoracic spine. Surgical clips right chest. IMPRESSION: 1. Right dual-lumen catheter in stable position. 2. Stable cardiomegaly. 3. Progressive bilateral pulmonary infiltrates/edema. Tiny bilateral pleural effusions cannot be excluded. Electronically Signed   By: Marcello Moores  Register   On: 07/15/2020 05:24   DG Abd Portable 1V  Result Date:  07/13/2020 CLINICAL DATA:  Evaluate orogastric tube EXAM: PORTABLE ABDOMEN - 1 VIEW COMPARISON:  July 13, 2020 FINDINGS: The OG tube side port and  distal tip are in the region the stomach. IMPRESSION: The OG tube is in good position. Electronically Signed   By: Dorise Bullion III M.D   On: 07/13/2020 11:04   DG Abd Portable 1V  Result Date: 07/13/2020 CLINICAL DATA:  Vomiting. EXAM: PORTABLE ABDOMEN - 1 VIEW COMPARISON:  None. FINDINGS: No evidence of dilated bowel loops. Aortic atherosclerotic calcification noted. Compression screw noted in the right hip. Left hip prosthesis is also seen. IMPRESSION: Unremarkable bowel gas pattern.  No acute findings. Electronically Signed   By: Marlaine Hind M.D.   On: 07/13/2020 05:08   ECHOCARDIOGRAM COMPLETE  Result Date: 07/14/2020    ECHOCARDIOGRAM REPORT   Patient Name:   MAKENDRA VIGEANT Date of Exam: 07/14/2020 Medical Rec #:  [id number]     Height:       64.0 in Accession #:    [id number]    Weight:       174.6 lb Date of Birth:  03-08-1941      BSA:          1.847 m Patient Age:    63 years      BP:           114/75 mmHg Patient Gender: F             HR:           77 bpm. Exam Location:  Inpatient Procedure: 2D Echo, Color Doppler and Cardiac Doppler Indications:    Stroke 434.91 / I163.9  History:        Patient has no prior history of Echocardiogram examinations.                 Risk Factors:Dyslipidemia and Diabetes.  Sonographer:    Bernadene Person RDCS Referring Phys: [place]  1. Left ventricular ejection fraction, by estimation, is 60 to 65%. The left ventricle has normal function. The left ventricle has no regional wall motion abnormalities. There is moderate left ventricular hypertrophy. Left ventricular diastolic parameters are consistent with Grade I diastolic dysfunction (impaired relaxation). Elevated left ventricular end-diastolic pressure. The E/e' is 38.  2. Right ventricular systolic function is normal. The right  ventricular size is normal.  3. The pericardial effusion is posterior to the left ventricle.  4. The mitral valve is abnormal. Mild mitral valve regurgitation. Moderate mitral annular calcification.  5. The aortic valve is tricuspid. Aortic valve regurgitation is not visualized. Mild aortic valve sclerosis is present, with no evidence of aortic valve stenosis. Comparison(s): No prior Echocardiogram. FINDINGS  Left Ventricle: Left ventricular ejection fraction, by estimation, is 60 to 65%. The left ventricle has normal function. The left ventricle has no regional wall motion abnormalities. The left ventricular internal cavity size was normal in size. There is  moderate left ventricular hypertrophy. Left ventricular diastolic parameters are consistent with Grade I diastolic dysfunction (impaired relaxation). Elevated left ventricular end-diastolic pressure. The E/e' is 20. Right Ventricle: The right ventricular size is normal. No increase in right ventricular wall thickness. Right ventricular systolic function is normal. Left Atrium: Left atrial size was normal in size. Right Atrium: Right atrial size was normal in size. Pericardium: Trivial pericardial effusion is present. The pericardial effusion is posterior to the left ventricle. Mitral Valve: The mitral valve is abnormal. There is mild thickening of the mitral valve leaflet(s). Moderate mitral annular calcification. Mild mitral valve regurgitation, with posteriorly-directed jet. Tricuspid Valve: The tricuspid valve is grossly normal. Tricuspid valve regurgitation is trivial. Aortic Valve:  The aortic valve is tricuspid. Aortic valve regurgitation is not visualized. Mild aortic valve sclerosis is present, with no evidence of aortic valve stenosis. Pulmonic Valve: The pulmonic valve was normal in structure. Pulmonic valve regurgitation is trivial. Aorta: The aortic root and ascending aorta are structurally normal, with no evidence of dilitation. Venous: The inferior  vena cava was not well visualized. IAS/Shunts: No atrial level shunt detected by color flow Doppler.  LEFT VENTRICLE PLAX 2D LVIDd:         4.30 cm  Diastology LVIDs:         2.80 cm  LV e' medial:    5.66 cm/s LV PW:         1.20 cm  LV E/e' medial:  21.9 LV IVS:        1.20 cm  LV e' lateral:   6.20 cm/s LVOT diam:     2.10 cm  LV E/e' lateral: 20.0 LV SV:         103 LV SV Index:   56 LVOT Area:     3.46 cm  RIGHT VENTRICLE RV S prime:     13.90 cm/s TAPSE (M-mode): 1.8 cm LEFT ATRIUM             Index       RIGHT ATRIUM           Index LA diam:        3.30 cm 1.79 cm/m  RA Area:     13.60 cm LA Vol (A2C):   51.0 ml 27.62 ml/m RA Volume:   28.50 ml  15.43 ml/m LA Vol (A4C):   50.1 ml 27.13 ml/m LA Biplane Vol: 51.9 ml 28.10 ml/m  AORTIC VALVE LVOT Vmax:   143.00 cm/s LVOT Vmean:  98.200 cm/s LVOT VTI:    0.296 m  AORTA Ao Root diam: 3.00 cm Ao Asc diam:  3.40 cm MITRAL VALVE                TRICUSPID VALVE MV Area (PHT): 3.21 cm     TR Peak grad:   23.2 mmHg MV Decel Time: 236 msec     TR Vmax:        241.00 cm/s MV E velocity: 124.00 cm/s MV A velocity: 120.00 cm/s  SHUNTS MV E/A ratio:  1.03         Systemic VTI:  0.30 m                             Systemic Diam: 2.10 cm Lyman Bishop MD Electronically signed by Lyman Bishop MD Signature Date/Time: 07/14/2020/2:10:04 PM    Final    CT HEAD CODE STROKE WO CONTRAST  Addendum Date: 07/12/2020   ADDENDUM REPORT: 07/12/2020 10:16 ADDENDUM: Correction.  Findings were discussed with Dr. Theda Sers. Electronically Signed   By: Margaretha Sheffield MD   On: 07/12/2020 10:16   Result Date: 07/12/2020 CLINICAL DATA:  Code stroke.  Neuro deficit, acute stroke suspected. EXAM: CT HEAD WITHOUT CONTRAST TECHNIQUE: Contiguous axial images were obtained from the base of the skull through the vertex without intravenous contrast. COMPARISON:  MRI head August 17, 2016 FINDINGS: Brain: Acute hemorrhage in the right basal ganglia, centered in the region of the posterior  putamen/posterior limb of the internal capsule. The hemorrhage measures approximately 7 x 4 by 6 mm. No evidence of acute large vascular territory infarct. Patchy white matter hypoattenuation, which is nonspecific but most likely related  to chronic microvascular ischemic disease. Mild generalized atrophy. No hydrocephalus. No evidence of a mass lesion. No midline shift. Remote right cerebellar lacunar infarct. Vascular: Calcific atherosclerosis. Skull: No acute fracture. Sinuses/Orbits: Sinuses are clear.  Unremarkable orbits. Other: No mastoid effusion. IMPRESSION: 1. Acute 7 mm hemorrhage within the right basal ganglia. No substantial mass effect. 2. Chronic microvascular ischemic disease and remote right cerebellar lacunar infarct. Code stroke imaging results were communicated on 07/12/2020 at 10:00 am to provider Dr. Lorrin Goodell via telephone, who verbally acknowledged these results. Electronically Signed: By: Margaretha Sheffield MD On: 07/12/2020 10:05   VAS US CAROTID  Result Date: 07/14/2020 Carotid Arterial Duplex Study Indications:       CVA. Risk Factors:      Hyperlipidemia, Diabetes. Limitations        Today's exam was limited due to the patient's inability or                    unwillingness to cooperate. Comparison Study:  no prior Performing Technologist: Abram Sander RVS  Examination Guidelines: A complete evaluation includes B-mode imaging, spectral Doppler, color Doppler, and power Doppler as needed of all accessible portions of each vessel. Bilateral testing is considered an integral part of a complete examination. Limited examinations for reoccurring indications may be performed as noted.  Right Carotid Findings: +----------+--------+--------+--------+------------------+--------------+           PSV cm/sEDV cm/sStenosisPlaque DescriptionComments       +----------+--------+--------+--------+------------------+--------------+ CCA Prox  54      9               heterogenous                      +----------+--------+--------+--------+------------------+--------------+ CCA Distal65      16              heterogenous                     +----------+--------+--------+--------+------------------+--------------+ ICA Prox  48      17      1-39%   heterogenous                     +----------+--------+--------+--------+------------------+--------------+ ICA Distal                                          Not visualized +----------+--------+--------+--------+------------------+--------------+ ECA       91      9                                                +----------+--------+--------+--------+------------------+--------------+ +----------+--------+-------+--------------------------+-------------------+           PSV cm/sEDV cmsDescribe                  Arm Pressure (mmHG) +----------+--------+-------+--------------------------+-------------------+ Subclavian               not visualized due to port                    +----------+--------+-------+--------------------------+-------------------+ +---------+--------+--------+--------------+ VertebralPSV cm/sEDV cm/sNot identified +---------+--------+--------+--------------+  Left Carotid Findings: +----------+--------+--------+--------+------------------+--------------+           PSV cm/sEDV cm/sStenosisPlaque DescriptionComments       +----------+--------+--------+--------+------------------+--------------+ CCA Prox  53      5               heterogenous                     +----------+--------+--------+--------+------------------+--------------+ CCA Distal73      10              heterogenous                     +----------+--------+--------+--------+------------------+--------------+ ICA Prox  127     16      1-39%   heterogenous                     +----------+--------+--------+--------+------------------+--------------+ ICA Distal                                          Not  visualized +----------+--------+--------+--------+------------------+--------------+ ECA       220                                                      +----------+--------+--------+--------+------------------+--------------+ +----------+--------+--------+--------------+-------------------+           PSV cm/sEDV cm/sDescribe      Arm Pressure (mmHG) +----------+--------+--------+--------------+-------------------+ Subclavian                Not identified                    +----------+--------+--------+--------------+-------------------+ +---------+--------+--------+--------------+ VertebralPSV cm/sEDV cm/sNot identified +---------+--------+--------+--------------+   Summary: Right Carotid: Velocities in the right ICA are consistent with a 1-39% stenosis. Left Carotid: Velocities in the left ICA are consistent with a 1-39% stenosis. Vertebrals: Bilateral vertebral arteries were not visualized. *See table(s) above for measurements and observations.  Electronically signed by Antony Contras MD on 07/14/2020 at 1:04:27 PM.    Final

## 2020-07-28 NOTE — Procedures (Signed)
I was present at this dialysis session. I have reviewed the session itself and made appropriate changes.   2K bath. 2L UF, TDC.  Tol HD well, no issues. SNF search in process.  Off isolation as of today for outpt HD.   Filed Weights   07/26/20 1958 07/27/20 0414 07/28/20 0735  Weight: 77.2 kg 77.2 kg 75.1 kg    Recent Labs  Lab 07/27/20 0225  NA 134*  K 4.5  CL 96*  CO2 23  GLUCOSE 232*  BUN 43*  CREATININE 6.09*  CALCIUM 9.3  PHOS 5.5*    Recent Labs  Lab 07/22/20 0240 07/23/20 0658 07/25/20 0924 07/27/20 0225  WBC 5.5 6.5 4.6 5.1  NEUTROABS 3.6  --   --   --   HGB 10.1* 10.3* 9.2* 9.7*  HCT 32.2* 32.5* 29.7* 29.2*  MCV 91.2 90.5 92.5 90.1  PLT 181 216 203 207    Scheduled Meds: . amiodarone  200 mg Oral Daily  . apixaban  2.5 mg Oral BID  . atorvastatin  20 mg Oral QHS  . chlorhexidine  15 mL Mouth Rinse BID  . Chlorhexidine Gluconate Cloth  6 each Topical Daily  . diltiazem  120 mg Oral Daily  . hydrALAZINE  25 mg Oral Q8H  . insulin aspart  0-5 Units Subcutaneous QHS  . insulin aspart  0-9 Units Subcutaneous TID WC  . insulin glargine  30 Units Subcutaneous Daily  . mouth rinse  15 mL Mouth Rinse q12n4p  . midodrine  10 mg Oral Once  . pantoprazole  40 mg Oral BID   Continuous Infusions: . sodium chloride Stopped (07/17/20 1515)   PRN Meds:.sodium chloride, docusate sodium, guaiFENesin-dextromethorphan, heparin sodium (porcine), metoprolol tartrate, ondansetron (ZOFRAN) IV, polyethylene glycol, sodium chloride flush   Pearson Grippe  MD 07/28/2020, 10:09 AM

## 2020-07-29 DIAGNOSIS — I639 Cerebral infarction, unspecified: Secondary | ICD-10-CM | POA: Diagnosis not present

## 2020-07-29 DIAGNOSIS — I4891 Unspecified atrial fibrillation: Secondary | ICD-10-CM | POA: Diagnosis not present

## 2020-07-29 LAB — URINALYSIS, ROUTINE W REFLEX MICROSCOPIC
Bilirubin Urine: NEGATIVE
Glucose, UA: >=500 mg/dL — AB
Hgb urine dipstick: NEGATIVE
Ketones, ur: NEGATIVE mg/dL
Nitrite: NEGATIVE
Protein, ur: 100 mg/dL — AB
Specific Gravity, Urine: 1.006 (ref 1.005–1.030)
WBC, UA: >50 WBC/hpf — ABNORMAL HIGH (ref 0–5)
pH: 8 (ref 5.0–8.0)

## 2020-07-29 LAB — GLUCOSE, CAPILLARY
Glucose-Capillary: 148 mg/dL — ABNORMAL HIGH (ref 70–99)
Glucose-Capillary: 188 mg/dL — ABNORMAL HIGH (ref 70–99)
Glucose-Capillary: 176 mg/dL — ABNORMAL HIGH (ref 70–99)
Glucose-Capillary: 235 mg/dL — ABNORMAL HIGH (ref 70–99)

## 2020-07-29 NOTE — TOC Progression Note (Addendum)
Transition of Care [facility]) - Progression Note    Patient Details  Name: USHA SLAGER MRN: [id number] Date of Birth: August 08, 1940  Transition of [facility] CM/SW [place], [place] Phone Number: 07/29/2020, 4:30 PM  Clinical Narrative:    Per [facility], Maudie Mercury, she has started insurance authorization and their building now has power. She is hopeful to admit patient tomorrow; CSW will continue to follow and updated patient today.     Barriers to Discharge: Continued Medical Work up,SNF Pending bed offer,Insurance Authorization  Expected Discharge Plan and Services   In-house Referral: Clinical Social Work     Living arrangements for the past 2 months: Single Family Home Expected Discharge Date: 07/22/20                                     Social Determinants of Health (SDOH) Interventions    Readmission Risk Interventions No flowsheet data found.

## 2020-07-29 NOTE — Progress Notes (Signed)
  [name] Progress Note   Subjective:  Pt awaiting SNF bed.  Patient not examined today directly given COVID-19 + isolation, utilizing data taken from chart +/- discussions w/ providers and staff.    Objective Vitals:   07/29/20 0552 07/29/20 0608 07/29/20 0846 07/29/20 1300  BP: (!) 148/69   140/72  Pulse: 78   68  Resp: 18   17  Temp: 98.4 F (36.9 C)   98 F (36.7 C)  TempSrc: Oral   Oral  SpO2: 97%  98% 98%  Weight:  73 kg    Height:       Physical Exam General:   Patient not examined today directly given COVID-19 + isolation, utilizing data taken from chart +/- discussions w/ providers and staff.    Additional Objective Labs: Basic Metabolic Panel: Recent Labs  Lab 07/25/20 0924 07/27/20 0225 07/28/20 1212  NA 133* 134* 132*  K 4.1 4.5 3.5  CL 95* 96* 93*  CO2 24 23 24   GLUCOSE 145* 232* 129*  BUN 44* 43* 19  CREATININE 5.88* 6.09* 3.20*  CALCIUM 8.7* 9.3 9.0  PHOS 5.2* 5.5* 3.0   Liver Function Tests: Recent Labs  Lab 07/25/20 0924 07/27/20 0225 07/28/20 1212  ALBUMIN 2.8* 2.8* 3.2*   CBC: Recent Labs  Lab 07/23/20 0658 07/25/20 0924 07/27/20 0225 07/28/20 1212  WBC 6.5 4.6 5.1 5.5  HGB 10.3* 9.2* 9.7* 10.8*  HCT 32.5* 29.7* 29.2* 33.7*  MCV 90.5 92.5 90.1 89.4  PLT 216 203 207 198   Medications: . sodium chloride Stopped (07/17/20 1515)   . amiodarone  200 mg Oral Daily  . apixaban  2.5 mg Oral BID  . atorvastatin  20 mg Oral QHS  . chlorhexidine  15 mL Mouth Rinse BID  . Chlorhexidine Gluconate Cloth  6 each Topical Daily  . diltiazem  120 mg Oral Daily  . hydrALAZINE  25 mg Oral Q8H  . insulin aspart  0-15 Units Subcutaneous TID WC  . insulin aspart  0-5 Units Subcutaneous QHS  . insulin glargine  30 Units Subcutaneous Daily  . letrozole  2.5 mg Oral Daily  . mouth rinse  15 mL Mouth Rinse q12n4p  . midodrine  10 mg Oral Once  . pantoprazole  40 mg Oral BID    Dialysis Orders: MWF [facility] 3.5h   400/800  79kg 3K/2.25 bath P2 Hep 3500 RIJ TDC/ no AVF - calcitriol 0.5 ug tiw  Assessment/Plan: 1.  COVID-19 infection: Dx 12/13, s/p monoclonal antibody treatment on 12/17.  She was unvaccinated. Improving.  2.  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 Some residual right hand weakness noted. 3. ESRD/ volume: cont HD MWF. HD on schedule. Below dry wt, adjust at DC. BP's OK.  4.  GI bleed with acute blood loss anemia: Hgb 10.3.  S/p EGD 12/23 showing gastritis and duodenitis without any focal bleeding.  Ongoing medical management with PPI. 5. CKD-MBD: Ca and P ok,  7.  Paroxysmal atrial fibrillation with rapid ventricular response: Back in sinus rhythm/rate controlled and now back on Eliquis following transient heparin drip. 8.  Dispo -  Pending SNF   Rexene Agent , MD 07/29/2020, 4:29 PM

## 2020-07-29 NOTE — Progress Notes (Signed)
PROGRESS NOTE                                                                                                                                                                                                             Patient Demographics:    [name], is a 80 y.o. female, DOB - 1941-04-30, HGD:[id number]  Outpatient Primary MD for the patient is [name], MD    LOS - 17  Admit date - 07/12/2020    Chief Complaint  Patient presents with  . Code Stroke  . GI Bleeding       Brief Narrative (HPI from H&P) - this is a 80 year old female with history of hypertension, lateral hip fractures, right-sided breast cancer s/p treatment including lumpectomy about 5 years ago, ESRD, dyslipidemia, DM type II, A. fib who was brought in the hospital on 07/12/2020 with altered mental status, was found to have ischemic stroke with hemorrhagic conversion, her stay was complicated by GI bleed and COVID-19 infection.  She was initially admitted under PCCM and was transferred to hospitalist service on 07/15/2020.  She has been seen so far by nephrology, neurology and PCCM.   Subjective:   Patient seen and examined.  She denies any complaints today, no chest pain, no shortness of breath,.   Assessment  & Plan :    Acute right basal ganglia hemorrhagic stroke- with minimal left-sided weakness. - This appears to be ischemic infarct with hemorrhagic conversion due to A. Fib.  -  Seen by stroke team, initially on aspirin until she is cleared by GI, then she has been started on Eliquis, will discontinue aspirin. -  LDL was within acceptable limits, echocardiogram nonacute, A1c slightly elevated  - will be discharged to SNF.   A. fib RVR.  -  Likely found this admission.  She is currently in and out of A. fib and RVR, have started her on Cardizem, amiodarone added by cardiology, stable TSH & echocardiogram noted with preserved EF.    On  Eliquis now outpatient cardiology follow-up in 1 to 2 weeks post discharge.  Acute GI bleed with blood loss related anemia.  - Appears to be upper GI.  S/p packed RBC transfusion on 07/12/2020 1 unit, monitor H&H, PPI, GI on board and she underwent EGD on 07/17/2020, significant for duodenitis and clear base duodenal ulcer,  not as increased risk for recurrent bleed, so started on Eliquis . -Continue with PPI.  ESRD.  On MWF schedule.    Neurology following continue outpatient regimen post discharge.  Dyslipidemia.  On statin.   Essential hypertension.  Very well controlled.  Continue diltiazem and hydralazine as well as as needed metoprolol.   History of right breast invasive lobular carcinoma -Followed  by Dr. Payton Mccallum -Continue with letrozole 2.5 mg daily    Mild metabolic encephalopathy.  She is completely alert and oriented at this point in time.   COVID-19 infection diagnosed on 07/07/2020.  S/p antibody infusion.  Monitor.     History of right-sided breast cancer.  Outpatient follow-up with oncology doses 27-year-old problem.     Hip pain.  Chronic.  CT scan stable.  Monitor with supportive care.  PT OT.    Stable.   DM type II.  Hemoglobin A1c 7.9 on 07/13/2020.  On glipizide at home which is on hold.  Improved on 30 units of Lantus, will increase sliding scale to moderate.  Lab Results  Component Value Date   HGBA1C 7.9 (H) 07/13/2020    CBG (last 3)  Recent Labs    07/28/20 2028 07/29/20 0759 07/29/20 1156  GLUCAP 221* 148* 188*    Lab Results  Component Value Date   CHOL 99 07/14/2020   HDL 31 (L) 07/14/2020   LDLCALC 32 07/14/2020   TRIG 178 (H) 07/14/2020   CHOLHDL 3.2 07/14/2020    Lab Results  Component Value Date   HGBA1C 7.9 (H) 07/13/2020      Lab Results  Component Value Date   TSH 1.219 07/16/2020        Condition -stable  Family Communication  :   Patient alert and oriented.  Code Status :  Full  Consults  : Neurology,  PCCM, nephrology, cardiology  Procedures  :    EGD 12/23 - gastritis and duodenitis.  CT HEAD  07/12/2020  -   Acute 7 mm hemorrhage within the right basal ganglia. No substantial mass effect. Chronic microvascular ischemic disease and remote right cerebellar lacunar infarct.   CT HEAD WO CONTRAST - 07/13/2020 expected evolutionary changes in the acute right basal ganglia hemorrhage overall similar in size with slightly decreased density compared to previous CT.  No new abnormality.  MRI / MRA Head - unchanged 1 cm right posterior lentiform nucleus/internal capsule hemorrhage without significant surrounding edema or mass-effect.  4 mm diffusion abnormality involving subcortical white matter in the right parietal corona radiata consistent with acute small vessel type infarct.    MRA of the brain technically limited due to motion artifact but no large vessel occlusion.  Focal high-grade stenosis of the right distal M1 segment and distal left M1 segment bifurcation.  Severe multifocal bilateral P2 and distal left P3 stenosis.  Transthoracic Echocardiogram - left ventricular ejection fraction 60 to 65%.  00/00/2021  Bilateral Carotid Dopplers - bilateral 1-39% stenosis of carotids.    PUD Prophylaxis : PPI  Disposition Plan  :    Status is: Inpatient  Remains inpatient appropriate because:IV treatments appropriate due to intensity of illness or inability to take PO   Dispo: The patient is from: Home              Anticipated d/c is to: SNF              Anticipated d/c date is: 1 day  Patient currently is medically stable to d/c.  Awaiting SNF bed availability   DVT Prophylaxis  :   Eliquis  Lab Results  Component Value Date   PLT 198 07/28/2020    Diet :  Diet Order            DIET SOFT Room service appropriate? Yes; Fluid consistency: Thin  Diet effective now                  Inpatient Medications  Scheduled Meds: . amiodarone  200 mg Oral Daily  .  apixaban  2.5 mg Oral BID  . atorvastatin  20 mg Oral QHS  . chlorhexidine  15 mL Mouth Rinse BID  . Chlorhexidine Gluconate Cloth  6 each Topical Daily  . diltiazem  120 mg Oral Daily  . hydrALAZINE  25 mg Oral Q8H  . insulin aspart  0-15 Units Subcutaneous TID WC  . insulin aspart  0-5 Units Subcutaneous QHS  . insulin glargine  30 Units Subcutaneous Daily  . letrozole  2.5 mg Oral Daily  . mouth rinse  15 mL Mouth Rinse q12n4p  . midodrine  10 mg Oral Once  . pantoprazole  40 mg Oral BID   Continuous Infusions: . sodium chloride Stopped (07/17/20 1515)   PRN Meds:.sodium chloride, docusate sodium, guaiFENesin-dextromethorphan, heparin sodium (porcine), metoprolol tartrate, ondansetron (ZOFRAN) IV, polyethylene glycol, sodium chloride flush  Antibiotics  :    Anti-infectives (From admission, onward)   None      Phillips Climes M.D on 07/29/2020 at 3:05 PM  To page go to [email or web address]   [facility] -  Office  [phone]   See all Orders from today for further details    Objective:   Vitals:   07/29/20 0552 07/29/20 0608 07/29/20 0846 07/29/20 1300  BP: (!) 148/69   140/72  Pulse: 78   68  Resp: 18   17  Temp: 98.4 F (36.9 C)   98 F (36.7 C)  TempSrc: Oral   Oral  SpO2: 97%  98% 98%  Weight:  73 kg    Height:        Wt Readings from Last 3 Encounters:  07/29/20 73 kg  04/21/20 77.9 kg  04/20/19 90.2 kg     Intake/Output Summary (Last 24 hours) at 07/29/2020 1505 Last data filed at 07/29/2020 1400 Gross per 24 hour  Intake 720 ml  Output --  Net 720 ml     Physical Exam  Awake Alert, Oriented X 3, No new F.N deficits, Normal affect Symmetrical Chest wall movement, Good air movement bilaterally, CTAB RRR,No Gallops,Rubs or new Murmurs, No Parasternal Heave +ve B.Sounds, Abd Soft, No tenderness, No rebound - guarding or rigidity. No Cyanosis, Clubbing or edema, No new Rash or bruise      Data Review:    CBC Recent Labs  Lab  07/23/20 0658 07/25/20 0924 07/27/20 0225 07/28/20 1212  WBC 6.5 4.6 5.1 5.5  HGB 10.3* 9.2* 9.7* 10.8*  HCT 32.5* 29.7* 29.2* 33.7*  PLT 216 203 207 198  MCV 90.5 92.5 90.1 89.4  MCH 28.7 28.7 29.9 28.6  MCHC 31.7 31.0 33.2 32.0  RDW 16.8* 16.6* 16.9* 16.8*    Recent Labs  Lab 07/23/20 0658 07/25/20 0924 07/27/20 0225 07/28/20 1212  NA 132* 133* 134* 132*  K 4.4 4.1 4.5 3.5  CL 95* 95* 96* 93*  CO2 23 24 23 24   GLUCOSE 182* 145* 232* 129*  BUN 43* 44* 43* 19  CREATININE  5.99* 5.88* 6.09* 3.20*  CALCIUM 8.9 8.7* 9.3 9.0  ALBUMIN 3.0* 2.8* 2.8* 3.2*    ------------------------------------------------------------------------------------------------------------------ No results for input(s): CHOL, HDL, LDLCALC, TRIG, CHOLHDL, LDLDIRECT in the last 72 hours.  Lab Results  Component Value Date   HGBA1C 7.9 (H) 07/13/2020   ------------------------------------------------------------------------------------------------------------------ No results for input(s): TSH, T4TOTAL, T3FREE, THYROIDAB in the last 72 hours.  Invalid input(s): FREET3  Cardiac Enzymes No results for input(s): CKMB, TROPONINI, MYOGLOBIN in the last 168 hours.  Invalid input(s): CK ------------------------------------------------------------------------------------------------------------------    Component Value Date/Time   BNP 113.8 (H) 07/19/2020 0200    Micro Results Recent Results (from the past 240 hour(s))  SARS CORONAVIRUS 2 (TAT 6-24 HRS) Nasopharyngeal Nasopharyngeal Swab     Status: Abnormal   Collection Time: 07/21/20  1:34 PM   Specimen: Nasopharyngeal Swab  Result Value Ref Range Status   SARS Coronavirus 2 POSITIVE (A) NEGATIVE Final    Comment: RESULT CALLED TO, READ BACK BY AND VERIFIED WITH: F.AFOR,RN @2142  07/21/2020 VANG.J (NOTE) SARS-CoV-2 target nucleic acids are DETECTED.  The SARS-CoV-2 RNA is generally detectable in upper and lower respiratory specimens during  the acute phase of infection. Positive results are indicative of the presence of SARS-CoV-2 RNA. Clinical correlation with patient history and other diagnostic information is  necessary to determine patient infection status. Positive results do not rule out bacterial infection or co-infection with other viruses.  The expected result is Negative.  Fact Sheet for Patients: SugarRoll.be  Fact Sheet for Healthcare Providers: https://www.woods-mathews.com/  This test is not yet approved or cleared by the [place] FDA and  has been authorized for detection and/or diagnosis of SARS-CoV-2 by FDA under an Emergency Use Authorization (EUA). This EUA will remain  in effect (meaning this test can be used) f or the duration of the COVID-19 declaration under Section 564(b)(1) of the Act, 21 U.S.C. section 360bbb-3(b)(1), unless the authorization is terminated or revoked sooner.   Performed at [facility], [place] [address], [place], [place] [address]     Radiology Reports DG Chest 1 View  Result Date: 07/12/2020 CLINICAL DATA:  Patient with history of COVID-19. History of GI bleed. EXAM: CHEST  1 VIEW COMPARISON:  Chest radiograph 07/10/2020. FINDINGS: Stable right-sided dialysis catheter. Monitoring leads overlie the patient. Stable cardiomegaly. Aortic atherosclerosis. Site oval increased patchy opacities left mid lower lung. No definite pleural effusion or pneumothorax. Thoracic spine degenerative changes. Surgical clips right axilla. IMPRESSION: Increasing patchy opacities left mid and lower lung may represent infection in the appropriate clinical setting or potentially atelectasis. Electronically Signed   By: Lovey Newcomer M.D.   On: 07/12/2020 11:24   CT HEAD WO CONTRAST  Result Date: 07/16/2020 CLINICAL DATA:  Stroke follow-up. EXAM: CT HEAD WITHOUT CONTRAST TECHNIQUE: Contiguous axial images were obtained from the base of the skull through  the vertex without intravenous contrast. COMPARISON:  07/13/2020 head CT and MRI FINDINGS: Brain: Approximately 1 cm hyperdense focus at the posterior aspect of the right lentiform nucleus corresponding to the previously demonstrated hemorrhage is unchanged in size. There is no associated edema. No new intracranial hemorrhage, acute large territory infarct, mass, midline shift, or extra-axial fluid collection is identified. Mild cerebral atrophy is within normal limits for age. Hypodensities in the cerebral white matter bilaterally are unchanged and nonspecific but compatible with mild to moderate chronic small vessel ischemic disease. Vascular: Calcified atherosclerosis at the skull base. Skull: No fracture or suspicious osseous lesion. Sinuses/Orbits: Moderate mucosal thickening in the frontal  sinuses. Small right mastoid effusion. Bilateral cataract extraction. Other: None. IMPRESSION: 1. Unchanged 1 cm right basal ganglia hemorrhage. 2. No new intracranial abnormality. Electronically Signed   By: Logan Bores M.D.   On: 07/16/2020 15:15   CT HEAD WO CONTRAST  Result Date: 07/13/2020 CLINICAL DATA:  Follow-up examination of basal ganglia hemorrhage. EXAM: CT HEAD WITHOUT CONTRAST TECHNIQUE: Contiguous axial images were obtained from the base of the skull through the vertex without intravenous contrast. COMPARISON:  Prior CT from 07/12/2020 FINDINGS: Brain: Previously identified intraparenchymal hemorrhage at the posterior right lentiform nucleus again seen. Bleed has mildly dispersed and is less dense in appearance as compared to previous exam, measuring overall similar in size at 10 x 7 x 10 mm, previously 10 x 5 x 10 mm when measured in a similar fashion. No significant surrounding edema or regional mass effect. No other acute or interval intracranial hemorrhage. No other acute large vessel territory infarct. No mass lesion, mass effect, or midline shift. No hydrocephalus or extra-axial fluid collection.  Atrophy with chronic microvascular ischemic disease again noted. Vascular: No hyperdense vessel. Calcified atherosclerosis at the skull base. Skull: Scalp soft tissues and calvarium within normal limits. Sinuses/Orbits: Globes and orbital soft tissues demonstrate no acute finding. Mild mucoperiosteal thickening noted throughout the visualized paranasal sinuses. Mastoid air cells remain clear. Other: None. IMPRESSION: 1. Normal expected interval evolution of acute intraparenchymal hemorrhage positioned at the posterior right lentiform nucleus, overall similar in size but with slightly decreased density as compared to previous. No significant surrounding edema or regional mass effect. 2. No other new acute intracranial abnormality. 3. Atrophy with chronic microvascular ischemic disease. Electronically Signed   By: Jeannine Boga M.D.   On: 07/13/2020 18:45   CT PELVIS WO CONTRAST  Result Date: 07/14/2020 CLINICAL DATA:  Right hip pain EXAM: CT PELVIS WITHOUT CONTRAST TECHNIQUE: Multidetector CT imaging of the pelvis was performed following the standard protocol without intravenous contrast. COMPARISON:  Radiographs 07/12/2020 FINDINGS: Urinary Tract: Bladder and distal ureters obscured by streak artifact from the patient's bilateral hip hardware. Bowel:  Unremarkable, rectum partially obscured by streak artifact. Vascular/Lymphatic: Aortoiliac atherosclerotic vascular disease. No pathologic adenopathy identified. Reproductive:  Unremarkable Other:  No supplemental non-categorized findings. Musculoskeletal: Right hip screw with IM nail. Single distal interlocking screw along the IM nail. Inter trochanteric deformity from old fracture, no acute fracture is identified. Severe degenerative right hip arthropathy with loss of articular space and spurring. Left hip hemiarthroplasty without discrete complicating feature identified. Mild spurring along the SI joints. There is lower lumbar spondylosis and  degenerative disc disease leading to moderate left foraminal stenosis at L5-S1. IMPRESSION: 1. Severe degenerative right hip arthropathy. 2. Right hip screw with IM nail. Right intertrochanteric deformity from old fracture. No acute fracture is identified. 3. Left hip hemiarthroplasty without discrete complicating feature identified. 4. Lower lumbar spondylosis and degenerative disc disease leading to moderate left foraminal stenosis at L5-S1. 5. Aortic atherosclerosis. Aortic Atherosclerosis (ICD10-I70.0). Electronically Signed   By: Van Clines M.D.   On: 07/14/2020 14:12   MR ANGIO HEAD WO CONTRAST  Result Date: 07/13/2020 CLINICAL DATA:  Follow-up examination for acute stroke. EXAM: MRI HEAD WITHOUT CONTRAST MRA HEAD WITHOUT CONTRAST TECHNIQUE: Multiplanar, multiecho pulse sequences of the brain and surrounding structures were obtained without intravenous contrast. Angiographic images of the head were obtained using MRA technique without contrast. COMPARISON:  Prior head CT from 07/12/2020 as well as previous brain MRI from 08/17/2016. FINDINGS: MRI HEAD FINDINGS Brain: Diffuse prominence of  the CSF containing spaces compatible with generalized age-related cerebral atrophy. Patchy and confluent T2/FLAIR hyperintensity within the periventricular and deep white matter both cerebral hemispheres most likely related chronic microvascular ischemic disease. Patchy involvement of the pons. Overall, these changes are mildly progressed as compared to 2018. Few small remote lacunar type infarcts noted about the basal ganglia, most notable which present at the right caudothalamic groove. Previously identified hemorrhage centered at the posterior right lentiform nucleus/right internal capsule again seen, stable in size measuring up to approximately 1 cm in greatest dimension. This is somewhat difficult to visualized by MR, and is most notable on T1 weighted sequence (series 16, image 26). No significant  surrounding edema or regional mass effect. No other acute intracranial hemorrhage. Few scattered chronic micro hemorrhages noted at the right cerebellum and pons, likely small vessel/hypertensive in nature. Single punctate 4 mm focus of diffusion abnormality involving the subcortical white matter of the right parietal corona radiata noted, likely a small acute to subacute small vessel type ischemic infarct (series 5, image 74). No associated hemorrhage or mass effect. No other diffusion abnormality to suggest acute or subacute ischemia. Gray-white matter differentiation otherwise maintained. No mass lesion, mass effect, or midline shift. No hydrocephalus or extra-axial fluid collection. Pituitary gland suprasellar region normal. Midline structures intact. 1.3 cm ovoid well-circumscribed lesion positioned at the left petrous apex demonstrating heterogeneous T2/FLAIR signal intensity with hyperintense T1 signal intensity noted (series 16, image 14), nonspecific, but could reflect a small cholesterol granuloma versus asymmetric fatty marrow. Finding is relatively stable from previous MRI from 2018, and of doubtful significance. Vascular: Major intracranial vascular flow voids are maintained. Skull and upper cervical spine: Craniocervical junction within normal limits. Multilevel degenerative spondylosis noted within the visualized upper cervical spine with resultant mild-to-moderate spinal stenosis. Bone marrow signal intensity within normal limits. No scalp soft tissue abnormality. Sinuses/Orbits: Patient status post bilateral ocular lens replacement. Globes and orbital soft tissues demonstrate no acute finding. Mild scattered mucosal thickening noted throughout the paranasal sinuses. Trace bilateral mastoid effusions, of doubtful significance. Visualized nasopharynx within normal limits. Other: None. MRA HEAD FINDINGS ANTERIOR CIRCULATION: Examination degraded by motion artifact. Visualized distal cervical segments  of the internal carotid arteries are patent with symmetric antegrade flow. Petrous segments widely patent. Atheromatous irregularity throughout the carotid siphons with associated mild multifocal narrowing. A1 segments patent bilaterally. Normal anterior communicating artery complex. Anterior cerebral arteries patent to their distal aspects without high-grade stenosis. Short-segment severe stenosis at the distal left M1 segment/left MCA bifurcation (series 1036, image 12). Left MCA branches are diffusely irregular but perfused distally. Right M1 segment patent proximally. Focal signal cutoff at the mid-distal right M1 segment into the bifurcation (series 1036, image 11). This likely reflects a severe high-grade stenosis, as the right MCA branches are perfused distally. Diffuse atheromatous irregularity seen throughout the right MCA branches as well. Proximally. POSTERIOR CIRCULATION: Vertebral arteries largely code dominant and patent to the vertebrobasilar junction. Both PICA origins patent and normal. Basilar patent to its distal aspect without stenosis. Superior cerebral arteries patent bilaterally. Both PCAs primarily supplied via the basilar. Severe long segment stenosis involving the mid-distal right P2 segment (series 1046, image 15). Severe multifocal distal left P2 and P3 stenoses present as well (series 1046, image 14). PCAs remain perfused to their distal aspects. No intracranial aneurysm or other vascular abnormality. IMPRESSION: MRI HEAD IMPRESSION: 1. Unchanged 1 cm hemorrhage centered at the posterior right lentiform nucleus/right internal capsule. No significant surrounding edema or regional mass effect. 2.  4 mm focus of diffusion abnormality involving the subcortical white matter of the right parietal corona radiata, consistent with a small acute to subacute small vessel type ischemic infarct. No associated hemorrhage. 3. Underlying age-related cerebral atrophy with moderate chronic microvascular  ischemic disease, mildly progressed as compared to 2018. MRA HEAD IMPRESSION: 1. Technically limited exam due to motion artifact. 2. Negative intracranial MRA for large vessel occlusion. 3. Focal severe high-grade stenoses involving the mid-distal right M1 segment and distal left M1 segment/left MCA bifurcation. Left MCA branches remain perfused distally. 4. Severe multifocal bilateral P2 and distal left P3 stenoses as above. Electronically Signed   By: Jeannine Boga M.D.   On: 07/13/2020 19:16   MR BRAIN WO CONTRAST  Result Date: 07/13/2020 CLINICAL DATA:  Follow-up examination for acute stroke. EXAM: MRI HEAD WITHOUT CONTRAST MRA HEAD WITHOUT CONTRAST TECHNIQUE: Multiplanar, multiecho pulse sequences of the brain and surrounding structures were obtained without intravenous contrast. Angiographic images of the head were obtained using MRA technique without contrast. COMPARISON:  Prior head CT from 07/12/2020 as well as previous brain MRI from 08/17/2016. FINDINGS: MRI HEAD FINDINGS Brain: Diffuse prominence of the CSF containing spaces compatible with generalized age-related cerebral atrophy. Patchy and confluent T2/FLAIR hyperintensity within the periventricular and deep white matter both cerebral hemispheres most likely related chronic microvascular ischemic disease. Patchy involvement of the pons. Overall, these changes are mildly progressed as compared to 2018. Few small remote lacunar type infarcts noted about the basal ganglia, most notable which present at the right caudothalamic groove. Previously identified hemorrhage centered at the posterior right lentiform nucleus/right internal capsule again seen, stable in size measuring up to approximately 1 cm in greatest dimension. This is somewhat difficult to visualized by MR, and is most notable on T1 weighted sequence (series 16, image 26). No significant surrounding edema or regional mass effect. No other acute intracranial hemorrhage. Few  scattered chronic micro hemorrhages noted at the right cerebellum and pons, likely small vessel/hypertensive in nature. Single punctate 4 mm focus of diffusion abnormality involving the subcortical white matter of the right parietal corona radiata noted, likely a small acute to subacute small vessel type ischemic infarct (series 5, image 74). No associated hemorrhage or mass effect. No other diffusion abnormality to suggest acute or subacute ischemia. Gray-white matter differentiation otherwise maintained. No mass lesion, mass effect, or midline shift. No hydrocephalus or extra-axial fluid collection. Pituitary gland suprasellar region normal. Midline structures intact. 1.3 cm ovoid well-circumscribed lesion positioned at the left petrous apex demonstrating heterogeneous T2/FLAIR signal intensity with hyperintense T1 signal intensity noted (series 16, image 14), nonspecific, but could reflect a small cholesterol granuloma versus asymmetric fatty marrow. Finding is relatively stable from previous MRI from 2018, and of doubtful significance. Vascular: Major intracranial vascular flow voids are maintained. Skull and upper cervical spine: Craniocervical junction within normal limits. Multilevel degenerative spondylosis noted within the visualized upper cervical spine with resultant mild-to-moderate spinal stenosis. Bone marrow signal intensity within normal limits. No scalp soft tissue abnormality. Sinuses/Orbits: Patient status post bilateral ocular lens replacement. Globes and orbital soft tissues demonstrate no acute finding. Mild scattered mucosal thickening noted throughout the paranasal sinuses. Trace bilateral mastoid effusions, of doubtful significance. Visualized nasopharynx within normal limits. Other: None. MRA HEAD FINDINGS ANTERIOR CIRCULATION: Examination degraded by motion artifact. Visualized distal cervical segments of the internal carotid arteries are patent with symmetric antegrade flow. Petrous  segments widely patent. Atheromatous irregularity throughout the carotid siphons with associated mild multifocal narrowing. A1 segments patent bilaterally.  Normal anterior communicating artery complex. Anterior cerebral arteries patent to their distal aspects without high-grade stenosis. Short-segment severe stenosis at the distal left M1 segment/left MCA bifurcation (series 1036, image 12). Left MCA branches are diffusely irregular but perfused distally. Right M1 segment patent proximally. Focal signal cutoff at the mid-distal right M1 segment into the bifurcation (series 1036, image 11). This likely reflects a severe high-grade stenosis, as the right MCA branches are perfused distally. Diffuse atheromatous irregularity seen throughout the right MCA branches as well. Proximally. POSTERIOR CIRCULATION: Vertebral arteries largely code dominant and patent to the vertebrobasilar junction. Both PICA origins patent and normal. Basilar patent to its distal aspect without stenosis. Superior cerebral arteries patent bilaterally. Both PCAs primarily supplied via the basilar. Severe long segment stenosis involving the mid-distal right P2 segment (series 1046, image 15). Severe multifocal distal left P2 and P3 stenoses present as well (series 1046, image 14). PCAs remain perfused to their distal aspects. No intracranial aneurysm or other vascular abnormality. IMPRESSION: MRI HEAD IMPRESSION: 1. Unchanged 1 cm hemorrhage centered at the posterior right lentiform nucleus/right internal capsule. No significant surrounding edema or regional mass effect. 2. 4 mm focus of diffusion abnormality involving the subcortical white matter of the right parietal corona radiata, consistent with a small acute to subacute small vessel type ischemic infarct. No associated hemorrhage. 3. Underlying age-related cerebral atrophy with moderate chronic microvascular ischemic disease, mildly progressed as compared to 2018. MRA HEAD IMPRESSION: 1.  Technically limited exam due to motion artifact. 2. Negative intracranial MRA for large vessel occlusion. 3. Focal severe high-grade stenoses involving the mid-distal right M1 segment and distal left M1 segment/left MCA bifurcation. Left MCA branches remain perfused distally. 4. Severe multifocal bilateral P2 and distal left P3 stenoses as above. Electronically Signed   By: Jeannine Boga M.D.   On: 07/13/2020 19:16   DG Pelvis Portable  Result Date: 07/12/2020 CLINICAL DATA:  Right hip pain.  No reported injury. EXAM: PORTABLE PELVIS 1-2 VIEWS COMPARISON:  07/21/2018 outside pelvic radiographs FINDINGS: Partially visualized left hip hemiarthroplasty and partially visualized fixation hardware in the proximal right femur with no evidence of hardware fracture or loosening. No evidence of hip dislocation on this single frontal view. No osseous fracture. No focal osseous lesions. Moderate right hip osteoarthritis. No suspicious focal osseous lesions. No pelvic diastasis. IMPRESSION: No acute osseous abnormality. No evidence of hip dislocation on this single frontal view. Moderate right hip osteoarthritis. Partially visualized left hip hemiarthroplasty and proximal right femur fixation hardware, with no evidence of hardware complication. Electronically Signed   By: Ilona Sorrel M.D.   On: 07/12/2020 12:33   DG CHEST PORT 1 VIEW  Result Date: 07/15/2020 CLINICAL DATA:  COVID-19. EXAM: PORTABLE CHEST 1 VIEW COMPARISON:  07/12/2020. FINDINGS: Right dual-lumen catheter stable position. Stable cardiomegaly. Progressive bilateral pulmonary infiltrates/edema. Tiny bilateral pleural effusions cannot be excluded. No pneumothorax. Degenerative change thoracic spine. Surgical clips right chest. IMPRESSION: 1. Right dual-lumen catheter in stable position. 2. Stable cardiomegaly. 3. Progressive bilateral pulmonary infiltrates/edema. Tiny bilateral pleural effusions cannot be excluded. Electronically Signed   By:  Marcello Moores  Register   On: 07/15/2020 05:24   DG Abd Portable 1V  Result Date: 07/13/2020 CLINICAL DATA:  Evaluate orogastric tube EXAM: PORTABLE ABDOMEN - 1 VIEW COMPARISON:  July 13, 2020 FINDINGS: The OG tube side port and distal tip are in the region the stomach. IMPRESSION: The OG tube is in good position. Electronically Signed   By: Dorise Bullion III M.D  On: 07/13/2020 11:04   DG Abd Portable 1V  Result Date: 07/13/2020 CLINICAL DATA:  Vomiting. EXAM: PORTABLE ABDOMEN - 1 VIEW COMPARISON:  None. FINDINGS: No evidence of dilated bowel loops. Aortic atherosclerotic calcification noted. Compression screw noted in the right hip. Left hip prosthesis is also seen. IMPRESSION: Unremarkable bowel gas pattern.  No acute findings. Electronically Signed   By: Marlaine Hind M.D.   On: 07/13/2020 05:08   ECHOCARDIOGRAM COMPLETE  Result Date: 07/14/2020    ECHOCARDIOGRAM REPORT   Patient Name:   CHAVELA JUSTINIANO Date of Exam: 07/14/2020 Medical Rec #:  [id number]     Height:       64.0 in Accession #:    [id number]    Weight:       174.6 lb Date of Birth:  [birth year]      BSA:          1.847 m Patient Age:    32 years      BP:           114/75 mmHg Patient Gender: F             HR:           77 bpm. Exam Location:  Inpatient Procedure: 2D Echo, Color Doppler and Cardiac Doppler Indications:    Stroke 434.91 / I163.9  History:        Patient has no prior history of Echocardiogram examinations.                 Risk Factors:Dyslipidemia and Diabetes.  Sonographer:    Bernadene Person RDCS Referring Phys: [place]  1. Left ventricular ejection fraction, by estimation, is 60 to 65%. The left ventricle has normal function. The left ventricle has no regional wall motion abnormalities. There is moderate left ventricular hypertrophy. Left ventricular diastolic parameters are consistent with Grade I diastolic dysfunction (impaired relaxation). Elevated left ventricular end-diastolic pressure. The  E/e' is 23.  2. Right ventricular systolic function is normal. The right ventricular size is normal.  3. The pericardial effusion is posterior to the left ventricle.  4. The mitral valve is abnormal. Mild mitral valve regurgitation. Moderate mitral annular calcification.  5. The aortic valve is tricuspid. Aortic valve regurgitation is not visualized. Mild aortic valve sclerosis is present, with no evidence of aortic valve stenosis. Comparison(s): No prior Echocardiogram. FINDINGS  Left Ventricle: Left ventricular ejection fraction, by estimation, is 60 to 65%. The left ventricle has normal function. The left ventricle has no regional wall motion abnormalities. The left ventricular internal cavity size was normal in size. There is  moderate left ventricular hypertrophy. Left ventricular diastolic parameters are consistent with Grade I diastolic dysfunction (impaired relaxation). Elevated left ventricular end-diastolic pressure. The E/e' is 20. Right Ventricle: The right ventricular size is normal. No increase in right ventricular wall thickness. Right ventricular systolic function is normal. Left Atrium: Left atrial size was normal in size. Right Atrium: Right atrial size was normal in size. Pericardium: Trivial pericardial effusion is present. The pericardial effusion is posterior to the left ventricle. Mitral Valve: The mitral valve is abnormal. There is mild thickening of the mitral valve leaflet(s). Moderate mitral annular calcification. Mild mitral valve regurgitation, with posteriorly-directed jet. Tricuspid Valve: The tricuspid valve is grossly normal. Tricuspid valve regurgitation is trivial. Aortic Valve: The aortic valve is tricuspid. Aortic valve regurgitation is not visualized. Mild aortic valve sclerosis is present, with no evidence of aortic valve stenosis. Pulmonic Valve: The pulmonic  valve was normal in structure. Pulmonic valve regurgitation is trivial. Aorta: The aortic root and ascending aorta are  structurally normal, with no evidence of dilitation. Venous: The inferior vena cava was not well visualized. IAS/Shunts: No atrial level shunt detected by color flow Doppler.  LEFT VENTRICLE PLAX 2D LVIDd:         4.30 cm  Diastology LVIDs:         2.80 cm  LV e' medial:    5.66 cm/s LV PW:         1.20 cm  LV E/e' medial:  21.9 LV IVS:        1.20 cm  LV e' lateral:   6.20 cm/s LVOT diam:     2.10 cm  LV E/e' lateral: 20.0 LV SV:         103 LV SV Index:   56 LVOT Area:     3.46 cm  RIGHT VENTRICLE RV S prime:     13.90 cm/s TAPSE (M-mode): 1.8 cm LEFT ATRIUM             Index       RIGHT ATRIUM           Index LA diam:        3.30 cm 1.79 cm/m  RA Area:     13.60 cm LA Vol (A2C):   51.0 ml 27.62 ml/m RA Volume:   28.50 ml  15.43 ml/m LA Vol (A4C):   50.1 ml 27.13 ml/m LA Biplane Vol: 51.9 ml 28.10 ml/m  AORTIC VALVE LVOT Vmax:   143.00 cm/s LVOT Vmean:  98.200 cm/s LVOT VTI:    0.296 m  AORTA Ao Root diam: 3.00 cm Ao Asc diam:  3.40 cm MITRAL VALVE                TRICUSPID VALVE MV Area (PHT): 3.21 cm     TR Peak grad:   23.2 mmHg MV Decel Time: 236 msec     TR Vmax:        241.00 cm/s MV E velocity: 124.00 cm/s MV A velocity: 120.00 cm/s  SHUNTS MV E/A ratio:  1.03         Systemic VTI:  0.30 m                             Systemic Diam: 2.10 cm Lyman Bishop MD Electronically signed by Lyman Bishop MD Signature Date/Time: 07/14/2020/2:10:04 PM    Final    CT HEAD CODE STROKE WO CONTRAST  Addendum Date: 07/12/2020   ADDENDUM REPORT: 07/12/2020 10:16 ADDENDUM: Correction.  Findings were discussed with Dr. Theda Sers. Electronically Signed   By: Margaretha Sheffield MD   On: 07/12/2020 10:16   Result Date: 07/12/2020 CLINICAL DATA:  Code stroke.  Neuro deficit, acute stroke suspected. EXAM: CT HEAD WITHOUT CONTRAST TECHNIQUE: Contiguous axial images were obtained from the base of the skull through the vertex without intravenous contrast. COMPARISON:  MRI head August 17, 2016 FINDINGS: Brain: Acute  hemorrhage in the right basal ganglia, centered in the region of the posterior putamen/posterior limb of the internal capsule. The hemorrhage measures approximately 7 x 4 by 6 mm. No evidence of acute large vascular territory infarct. Patchy white matter hypoattenuation, which is nonspecific but most likely related to chronic microvascular ischemic disease. Mild generalized atrophy. No hydrocephalus. No evidence of a mass lesion. No midline shift. Remote right cerebellar lacunar infarct. Vascular: Calcific atherosclerosis. Skull:  No acute fracture. Sinuses/Orbits: Sinuses are clear.  Unremarkable orbits. Other: No mastoid effusion. IMPRESSION: 1. Acute 7 mm hemorrhage within the right basal ganglia. No substantial mass effect. 2. Chronic microvascular ischemic disease and remote right cerebellar lacunar infarct. Code stroke imaging results were communicated on 07/12/2020 at 10:00 am to provider Dr. Lorrin Goodell via telephone, who verbally acknowledged these results. Electronically Signed: By: Margaretha Sheffield MD On: 07/12/2020 10:05   VAS US CAROTID  Result Date: 07/14/2020 Carotid Arterial Duplex Study Indications:       CVA. Risk Factors:      Hyperlipidemia, Diabetes. Limitations        Today's exam was limited due to the patient's inability or                    unwillingness to cooperate. Comparison Study:  no prior Performing Technologist: Abram Sander RVS  Examination Guidelines: A complete evaluation includes B-mode imaging, spectral Doppler, color Doppler, and power Doppler as needed of all accessible portions of each vessel. Bilateral testing is considered an integral part of a complete examination. Limited examinations for reoccurring indications may be performed as noted.  Right Carotid Findings: +----------+--------+--------+--------+------------------+--------------+           PSV cm/sEDV cm/sStenosisPlaque DescriptionComments        +----------+--------+--------+--------+------------------+--------------+ CCA Prox  54      9               heterogenous                     +----------+--------+--------+--------+------------------+--------------+ CCA Distal65      16              heterogenous                     +----------+--------+--------+--------+------------------+--------------+ ICA Prox  48      17      1-39%   heterogenous                     +----------+--------+--------+--------+------------------+--------------+ ICA Distal                                          Not visualized +----------+--------+--------+--------+------------------+--------------+ ECA       91      9                                                +----------+--------+--------+--------+------------------+--------------+ +----------+--------+-------+--------------------------+-------------------+           PSV cm/sEDV cmsDescribe                  Arm Pressure (mmHG) +----------+--------+-------+--------------------------+-------------------+ Subclavian               not visualized due to port                    +----------+--------+-------+--------------------------+-------------------+ +---------+--------+--------+--------------+ VertebralPSV cm/sEDV cm/sNot identified +---------+--------+--------+--------------+  Left Carotid Findings: +----------+--------+--------+--------+------------------+--------------+           PSV cm/sEDV cm/sStenosisPlaque DescriptionComments       +----------+--------+--------+--------+------------------+--------------+ CCA Prox  53      5               heterogenous                     +----------+--------+--------+--------+------------------+--------------+  CCA Distal73      10              heterogenous                     +----------+--------+--------+--------+------------------+--------------+ ICA Prox  127     16      1-39%   heterogenous                      +----------+--------+--------+--------+------------------+--------------+ ICA Distal                                          Not visualized +----------+--------+--------+--------+------------------+--------------+ ECA       220                                                      +----------+--------+--------+--------+------------------+--------------+ +----------+--------+--------+--------------+-------------------+           PSV cm/sEDV cm/sDescribe      Arm Pressure (mmHG) +----------+--------+--------+--------------+-------------------+ Subclavian                Not identified                    +----------+--------+--------+--------------+-------------------+ +---------+--------+--------+--------------+ VertebralPSV cm/sEDV cm/sNot identified +---------+--------+--------+--------------+   Summary: Right Carotid: Velocities in the right ICA are consistent with a 1-39% stenosis. Left Carotid: Velocities in the left ICA are consistent with a 1-39% stenosis. Vertebrals: Bilateral vertebral arteries were not visualized. *See table(s) above for measurements and observations.  Electronically signed by Antony Contras MD on 07/14/2020 at 1:04:27 PM.    Final

## 2020-07-29 NOTE — Care Management Important Message (Signed)
Important Message  Patient Details  Name: [name] MRN: [id number] Date of Birth: September 26, 1940   Medicare Important Message Given:  Yes - Important Message mailed due to current National Emergency   Verbal consent obtained due to current National Emergency  Relationship to patient: Self Contact Name: Siarah Deleo Call Date: 07/29/20  Time: 1114 Phone: [phone] Outcome: Spoke with contact Important Message mailed to: Patient address on file    [place] 07/29/2020, 11:15 AM

## 2020-07-30 DIAGNOSIS — E1122 Type 2 diabetes mellitus with diabetic chronic kidney disease: Secondary | ICD-10-CM

## 2020-07-30 DIAGNOSIS — U071 COVID-19: Secondary | ICD-10-CM | POA: Diagnosis not present

## 2020-07-30 DIAGNOSIS — I619 Nontraumatic intracerebral hemorrhage, unspecified: Secondary | ICD-10-CM | POA: Diagnosis not present

## 2020-07-30 DIAGNOSIS — I4891 Unspecified atrial fibrillation: Secondary | ICD-10-CM | POA: Diagnosis not present

## 2020-07-30 LAB — RENAL FUNCTION PANEL
Albumin: 3 g/dL — ABNORMAL LOW (ref 3.5–5.0)
Anion gap: 15 (ref 5–15)
BUN: 65 mg/dL — ABNORMAL HIGH (ref 8–23)
CO2: 22 mmol/L (ref 22–32)
Calcium: 9.3 mg/dL (ref 8.9–10.3)
Chloride: 96 mmol/L — ABNORMAL LOW (ref 98–111)
Creatinine, Ser: 7.13 mg/dL — ABNORMAL HIGH (ref 0.44–1.00)
GFR, Estimated: 5 mL/min — ABNORMAL LOW (ref >60)
Glucose, Bld: 180 mg/dL — ABNORMAL HIGH (ref 70–99)
Phosphorus: 6.5 mg/dL — ABNORMAL HIGH (ref 2.5–4.6)
Potassium: 4.2 mmol/L (ref 3.5–5.1)
Sodium: 133 mmol/L — ABNORMAL LOW (ref 135–145)

## 2020-07-30 LAB — CBC
HCT: 30.2 % — ABNORMAL LOW (ref 36.0–46.0)
Hemoglobin: 9.6 g/dL — ABNORMAL LOW (ref 12.0–15.0)
MCH: 29.2 pg (ref 26.0–34.0)
MCHC: 31.8 g/dL (ref 30.0–36.0)
MCV: 91.8 fL (ref 80.0–100.0)
Platelets: 206 10*3/uL (ref 150–400)
RBC: 3.29 MIL/uL — ABNORMAL LOW (ref 3.87–5.11)
RDW: 16.8 % — ABNORMAL HIGH (ref 11.5–15.5)
WBC: 5.7 10*3/uL (ref 4.0–10.5)
nRBC: 0 % (ref 0.0–0.2)

## 2020-07-30 LAB — GLUCOSE, CAPILLARY
Glucose-Capillary: 128 mg/dL — ABNORMAL HIGH (ref 70–99)
Glucose-Capillary: 234 mg/dL — ABNORMAL HIGH (ref 70–99)
Glucose-Capillary: 225 mg/dL — ABNORMAL HIGH (ref 70–99)
Glucose-Capillary: 260 mg/dL — ABNORMAL HIGH (ref 70–99)

## 2020-07-30 MED ORDER — SENNA 8.6 MG PO TABS
1.0000 | ORAL_TABLET | Freq: Once | ORAL | Status: AC
  Administered 2020-07-30: 8.6 mg via ORAL
  Filled 2020-07-30: qty 1

## 2020-07-30 MED ORDER — POLYETHYLENE GLYCOL 3350 17 G PO PACK
17.0000 g | PACK | Freq: Once | ORAL | Status: AC
  Administered 2020-07-30: 17 g via ORAL
  Filled 2020-07-30: qty 1

## 2020-07-30 MED ORDER — HEPARIN SODIUM (PORCINE) 1000 UNIT/ML IJ SOLN
INTRAMUSCULAR | Status: AC
  Filled 2020-07-30: qty 4

## 2020-07-30 NOTE — Hospital Course (Addendum)
80 year old woman PMH including ESRD, atrial fibrillation, presented 12/18 with altered mental status, found to have ischemic stroke with hemorrhagic conversion, complicated by atrial fibrillation with rapid ventricular response new diagnosis, GI bleed and COVID-19 infection.  Initially admitted by critical care and then transferred to the hospitalist service 12/21.  Seen by neurology with ultimate recommendations to start anticoagulation and follow-up in the stroke clinic as an outpatient.  Heart rate controlled, treated with diltiazem and amiodarone, plan to follow-up with cardiology as an outpatient.  COVID-19 was incidental and did not require any treatment.  Hospitalization was prolonged by delay in securing outpatient facility that could coordinate with dialysis.  Acute right basal ganglia hemorrhagic stroke with minimal left-sided weakness.  Thought to be secondary to atrial fibrillation.  Seen by stroke team. --Continue apixaban --SNF --Follow-up with stroke clinic as an outpatient  Atrial fibrillation with rapid ventricular response --Seen by cardiology, started on Cardizem, amiodarone and apixaban. --Echocardiogram showed preserved LVEF. --Outpatient follow-up with cardiology in 1 to 2 weeks  Acute GI bleed with acute blood loss anemia, thought to be upper in nature.  Superimposed on anemia of CKD.  Status post RBC transfusion 12/18 --Seen by GI, underwent EGD 12/23 which showed duodenitis and clear based duodenal ulcer --Restarted on apixaban as per GI, continue PPI  COVID-19 +, diagnosed at home 12/13, treated with antibody infusion prior to admission.  Did not require any inpatient treatment.  Did not require any inpatient treatment.  ESRD --Uncomplicated this admission.  Continue hemodialysis.  Diabetes mellitus type 2.  Hemoglobin A1c 7.9. --CBG stable  Acute metabolic encephalopathy resolved.

## 2020-07-30 NOTE — Discharge Summary (Addendum)
Physician Discharge Summary  [name]:[id number] DOB: August 13, 1940 DOA: 07/12/2020  PCP: [name], MD  Admit date: 07/12/2020 Discharge date: 07/30/2020  Recommendations for Outpatient Follow-up:  Acute right basal ganglia hemorrhagic stroke with minimal left-sided weakness.  --Follow-up with stroke clinic as an outpatient  COVID-19 +, diagnosed at home 12/13, treated with antibody infusion prior to admission.  Did not require any inpatient treatment.  Did not require any inpatient treatment. --should be able to come off isolation    Contact information for follow-up providers    [name], MD Follow up on 08/14/2020.   Specialties: Cardiology, Radiology Why: 2:45 pm with his Nurse Practitioner Contact information: [address] [place] [address] [phone]        [name], MD. Schedule an appointment as soon as possible for a visit in 1 week(s).   Specialty: Family Medicine Contact information: [place] [address] [phone]            Contact information for after-discharge care    Destination    [facility] Preferred SNF .   Service: Skilled Nursing Contact information: [address] [place] [address] [phone]                   Discharge Diagnoses: Principal diagnosis is #1 Principal Problem:   Hemorrhagic stroke [facility]) Active Problems:   DM type 2 (diabetes mellitus, type 2) ([name])   CVA (cerebral vascular accident) ([place])   Upper GI bleed   COVID-19 virus infection   Atrial fibrillation with RVR ([name])   ESRD on hemodialysis ([name])   Gastritis and gastroduodenitis   Discharge Condition: improved Disposition: SNF  Diet recommendation:  Diet Orders (From admission, onward)    Start     Ordered   07/18/20 0937  DIET SOFT Room service appropriate? Yes; Fluid consistency: Thin  Diet effective now       Question Answer Comment  Room service  appropriate? Yes   Fluid consistency: Thin      07/18/20 0937           Filed Weights   07/28/20 2031 07/29/20 0608 07/30/20 1415  Weight: 73 kg 73 kg 78.3 kg    HPI/Hospital Course:   [name] PMH including ESRD, atrial fibrillation, presented 12/18 with altered mental status, found to have ischemic stroke with hemorrhagic conversion, complicated by atrial fibrillation with rapid ventricular response new diagnosis, GI bleed and COVID-19 infection.  Initially admitted by critical care and then transferred to the hospitalist service 12/21.  Seen by neurology with ultimate recommendations to start anticoagulation and follow-up in the stroke clinic as an outpatient.  Heart rate controlled, treated with diltiazem and amiodarone, plan to follow-up with cardiology as an outpatient.  COVID-19 was incidental and did not require any treatment.  Hospitalization was prolonged by delay in securing outpatient facility that could coordinate with dialysis.  Acute right basal ganglia hemorrhagic stroke with minimal left-sided weakness.  Thought to be secondary to atrial fibrillation.  Seen by stroke team. --Continue apixaban --SNF --Follow-up with stroke clinic as an outpatient  Atrial fibrillation with rapid ventricular response --Seen by cardiology, started on Cardizem, amiodarone and apixaban. --Echocardiogram showed preserved LVEF. --Outpatient follow-up with cardiology in 1 to 2 weeks  Acute GI bleed with acute blood loss anemia, thought to be upper in nature.  Superimposed on anemia of CKD.  Status post RBC transfusion 12/18 --Seen by GI, underwent EGD 12/23  which showed duodenitis and clear based duodenal ulcer --Restarted on apixaban as per GI, continue PPI  COVID-19 +, diagnosed at home 12/13, treated with antibody infusion prior to admission.  Did not require any inpatient treatment.  Did not require any inpatient treatment.  ESRD --Uncomplicated this admission.  Continue  hemodialysis.  Diabetes mellitus type 2.  Hemoglobin A1c 7.9. --CBG stable  Acute metabolic encephalopathy resolved.      Consults:  . PCCM . Neurology  . Gastroenterology . Cardiology   Procedures:  . EGD 12/23 gastritis and duodenitis, clearing duodenal ulcer.  Today's assessment: S: CC: f/u feels well, no problems other than some constipation.  O: Vitals:  Vitals:   07/30/20 1630 07/30/20 1700  BP: 105/60 124/66  Pulse: 72 76  Resp:    Temp:    SpO2:      Constitutional:  . Appears calm and comfortable ENMT:  . grossly normal hearing  Respiratory:  . CTA bilaterally, no w/r/r.  . Respiratory effort normal.  Cardiovascular:  . RRR, no m/r/g . No LE extremity edema   Abdomen:  . soft Psychiatric:  . Mental status o Mood, affect appropriate  CBG stable Hgb stable  Discharge Instructions  Discharge Instructions    Ambulatory referral to Neurology   Complete by: As directed    An appointment is requested in approximately: 4 weeks   Discharge instructions   Complete by: As directed    Follow with Primary MD [name], MD in 7 days   Get CBC, CMP, 2 view Chest X ray -  checked next visit within 1 week by Primary MD or SNF MD.  Activity: As tolerated with Full fall precautions use walker/cane & assistance as needed  Disposition SNF  Diet:  Soft diet low carbohydrate diet with 1.5 L restriction per day.  CBGs - [facility]  Special Instructions: If you have smoked or chewed Tobacco  in the last 2 yrs please stop smoking, stop any regular Alcohol  and or any Recreational drug use.  On your next visit with your primary care physician please Get Medicines reviewed and adjusted.  Please request your Prim.MD to go over all Hospital Tests and Procedure/Radiological results at the follow up, please get all Hospital records sent to your Prim MD by signing hospital release before you go home.  If you experience worsening of your admission symptoms, develop  shortness of breath, life threatening emergency, suicidal or homicidal thoughts you must seek medical attention immediately by calling 911 or calling your MD immediately  if symptoms less severe.  You Must read complete instructions/literature along with all the possible adverse reactions/side effects for all the Medicines you take and that have been prescribed to you. Take any new Medicines after you have completely understood and accpet all the possible adverse reactions/side effects.   Increase activity slowly   Complete by: As directed      Allergies as of 07/30/2020   No Known Allergies     Medication List    STOP taking these medications   amLODipine 10 MG tablet Commonly known as: NORVASC   azithromycin 250 MG tablet Commonly known as: ZITHROMAX   dexamethasone 6 MG tablet Commonly known as: DECADRON   dextromethorphan-guaiFENesin 30-600 MG 12hr tablet Commonly known as: MUCINEX DM   metoprolol succinate 100 MG 24 hr tablet Commonly known as: TOPROL-XL     TAKE these medications   amiodarone 200 MG tablet Commonly known as: PACERONE Take 1 tablet (200 mg total) by  mouth daily.   apixaban 2.5 MG Tabs tablet Commonly known as: ELIQUIS Take 1 tablet (2.5 mg total) by mouth 2 (two) times daily.   ascorbic acid 500 MG tablet Commonly known as: VITAMIN C Take 500 mg by mouth 2 (two) times daily.   atorvastatin 20 MG tablet Commonly known as: LIPITOR Take 20 mg by mouth at bedtime.   CALTRATE 600 PO Take 2 tablets by mouth daily.   diltiazem 120 MG 24 hr capsule Commonly known as: CARDIZEM CD Take 1 capsule (120 mg total) by mouth daily.   glipiZIDE 2.5 MG 24 hr tablet Commonly known as: GLUCOTROL XL Take 2.5 mg by mouth daily.   hydrALAZINE 25 MG tablet Commonly known as: APRESOLINE Take 1 tablet (25 mg total) by mouth 2 (two) times daily.   Insulin Degludec-Liraglutide 100-3.6 UNIT-MG/ML Sopn Commonly known as: XULTOPHY Inject 44 Units into the skin  every morning.   letrozole 2.5 MG tablet Commonly known as: FEMARA Take 1 tablet (2.5 mg total) by mouth daily. What changed: when to take this   pantoprazole 40 MG tablet Commonly known as: PROTONIX Take 1 tablet (40 mg total) by mouth 2 (two) times daily.   sevelamer carbonate 800 MG tablet Commonly known as: RENVELA Take 800 mg by mouth 3 (three) times daily with meals.   Vitamin D 50 MCG (2000 UT) tablet Take 2,000 Units by mouth daily.   zinc sulfate 220 (50 Zn) MG capsule Take 220 mg by mouth 2 (two) times daily.            Durable Medical Equipment  (From admission, onward)         Start     Ordered   07/21/20 0818  For home use only DME Walker rolling  Once       Comments: 5 wheel  Question Answer Comment  Walker: With [place]   Patient needs a walker to treat with the following condition Weakness      07/21/20 0817   07/21/20 0818  For home use only DME 3 n 1  Once        07/21/20 0817         No Known Allergies  The results of significant diagnostics from this hospitalization (including imaging, microbiology, ancillary and laboratory) are listed below for reference.    Significant Diagnostic Studies: DG Chest 1 View  Result Date: 07/12/2020 CLINICAL DATA:  Patient with history of COVID-19. History of GI bleed. EXAM: CHEST  1 VIEW COMPARISON:  Chest radiograph 07/10/2020. FINDINGS: Stable right-sided dialysis catheter. Monitoring leads overlie the patient. Stable cardiomegaly. Aortic atherosclerosis. Site oval increased patchy opacities left mid lower lung. No definite pleural effusion or pneumothorax. Thoracic spine degenerative changes. Surgical clips right axilla. IMPRESSION: Increasing patchy opacities left mid and lower lung may represent infection in the appropriate clinical setting or potentially atelectasis. Electronically Signed   By: Lovey Newcomer M.D.   On: 07/12/2020 11:24   CT HEAD WO CONTRAST  Result Date: 07/16/2020 CLINICAL DATA:   Stroke follow-up. EXAM: CT HEAD WITHOUT CONTRAST TECHNIQUE: Contiguous axial images were obtained from the base of the skull through the vertex without intravenous contrast. COMPARISON:  07/13/2020 head CT and MRI FINDINGS: Brain: Approximately 1 cm hyperdense focus at the posterior aspect of the right lentiform nucleus corresponding to the previously demonstrated hemorrhage is unchanged in size. There is no associated edema. No new intracranial hemorrhage, acute large territory infarct, mass, midline shift, or extra-axial fluid collection is identified.  Mild cerebral atrophy is within normal limits for age. Hypodensities in the cerebral white matter bilaterally are unchanged and nonspecific but compatible with mild to moderate chronic small vessel ischemic disease. Vascular: Calcified atherosclerosis at the skull base. Skull: No fracture or suspicious osseous lesion. Sinuses/Orbits: Moderate mucosal thickening in the frontal sinuses. Small right mastoid effusion. Bilateral cataract extraction. Other: None. IMPRESSION: 1. Unchanged 1 cm right basal ganglia hemorrhage. 2. No new intracranial abnormality. Electronically Signed   By: Logan Bores M.D.   On: 07/16/2020 15:15   CT HEAD WO CONTRAST  Result Date: 07/13/2020 CLINICAL DATA:  Follow-up examination of basal ganglia hemorrhage. EXAM: CT HEAD WITHOUT CONTRAST TECHNIQUE: Contiguous axial images were obtained from the base of the skull through the vertex without intravenous contrast. COMPARISON:  Prior CT from 07/12/2020 FINDINGS: Brain: Previously identified intraparenchymal hemorrhage at the posterior right lentiform nucleus again seen. Bleed has mildly dispersed and is less dense in appearance as compared to previous exam, measuring overall similar in size at 10 x 7 x 10 mm, previously 10 x 5 x 10 mm when measured in a similar fashion. No significant surrounding edema or regional mass effect. No other acute or interval intracranial hemorrhage. No other  acute large vessel territory infarct. No mass lesion, mass effect, or midline shift. No hydrocephalus or extra-axial fluid collection. Atrophy with chronic microvascular ischemic disease again noted. Vascular: No hyperdense vessel. Calcified atherosclerosis at the skull base. Skull: Scalp soft tissues and calvarium within normal limits. Sinuses/Orbits: Globes and orbital soft tissues demonstrate no acute finding. Mild mucoperiosteal thickening noted throughout the visualized paranasal sinuses. Mastoid air cells remain clear. Other: None. IMPRESSION: 1. Normal expected interval evolution of acute intraparenchymal hemorrhage positioned at the posterior right lentiform nucleus, overall similar in size but with slightly decreased density as compared to previous. No significant surrounding edema or regional mass effect. 2. No other new acute intracranial abnormality. 3. Atrophy with chronic microvascular ischemic disease. Electronically Signed   By: Jeannine Boga M.D.   On: 07/13/2020 18:45   CT PELVIS WO CONTRAST  Result Date: 07/14/2020 CLINICAL DATA:  Right hip pain EXAM: CT PELVIS WITHOUT CONTRAST TECHNIQUE: Multidetector CT imaging of the pelvis was performed following the standard protocol without intravenous contrast. COMPARISON:  Radiographs 07/12/2020 FINDINGS: Urinary Tract: Bladder and distal ureters obscured by streak artifact from the patient's bilateral hip hardware. Bowel:  Unremarkable, rectum partially obscured by streak artifact. Vascular/Lymphatic: Aortoiliac atherosclerotic vascular disease. No pathologic adenopathy identified. Reproductive:  Unremarkable Other:  No supplemental non-categorized findings. Musculoskeletal: Right hip screw with IM nail. Single distal interlocking screw along the IM nail. Inter trochanteric deformity from old fracture, no acute fracture is identified. Severe degenerative right hip arthropathy with loss of articular space and spurring. Left hip hemiarthroplasty  without discrete complicating feature identified. Mild spurring along the SI joints. There is lower lumbar spondylosis and degenerative disc disease leading to moderate left foraminal stenosis at L5-S1. IMPRESSION: 1. Severe degenerative right hip arthropathy. 2. Right hip screw with IM nail. Right intertrochanteric deformity from old fracture. No acute fracture is identified. 3. Left hip hemiarthroplasty without discrete complicating feature identified. 4. Lower lumbar spondylosis and degenerative disc disease leading to moderate left foraminal stenosis at L5-S1. 5. Aortic atherosclerosis. Aortic Atherosclerosis (ICD10-I70.0). Electronically Signed   By: Van Clines M.D.   On: 07/14/2020 14:12   MR ANGIO HEAD WO CONTRAST  Result Date: 07/13/2020 CLINICAL DATA:  Follow-up examination for acute stroke. EXAM: MRI HEAD WITHOUT CONTRAST MRA HEAD WITHOUT  CONTRAST TECHNIQUE: Multiplanar, multiecho pulse sequences of the brain and surrounding structures were obtained without intravenous contrast. Angiographic images of the head were obtained using MRA technique without contrast. COMPARISON:  Prior head CT from 07/12/2020 as well as previous brain MRI from 08/17/2016. FINDINGS: MRI HEAD FINDINGS Brain: Diffuse prominence of the CSF containing spaces compatible with generalized age-related cerebral atrophy. Patchy and confluent T2/FLAIR hyperintensity within the periventricular and deep white matter both cerebral hemispheres most likely related chronic microvascular ischemic disease. Patchy involvement of the pons. Overall, these changes are mildly progressed as compared to 2018. Few small remote lacunar type infarcts noted about the basal ganglia, most notable which present at the right caudothalamic groove. Previously identified hemorrhage centered at the posterior right lentiform nucleus/right internal capsule again seen, stable in size measuring up to approximately 1 cm in greatest dimension. This is somewhat  difficult to visualized by MR, and is most notable on T1 weighted sequence (series 16, image 26). No significant surrounding edema or regional mass effect. No other acute intracranial hemorrhage. Few scattered chronic micro hemorrhages noted at the right cerebellum and pons, likely small vessel/hypertensive in nature. Single punctate 4 mm focus of diffusion abnormality involving the subcortical white matter of the right parietal corona radiata noted, likely a small acute to subacute small vessel type ischemic infarct (series 5, image 74). No associated hemorrhage or mass effect. No other diffusion abnormality to suggest acute or subacute ischemia. Gray-white matter differentiation otherwise maintained. No mass lesion, mass effect, or midline shift. No hydrocephalus or extra-axial fluid collection. Pituitary gland suprasellar region normal. Midline structures intact. 1.3 cm ovoid well-circumscribed lesion positioned at the left petrous apex demonstrating heterogeneous T2/FLAIR signal intensity with hyperintense T1 signal intensity noted (series 16, image 14), nonspecific, but could reflect a small cholesterol granuloma versus asymmetric fatty marrow. Finding is relatively stable from previous MRI from 2018, and of doubtful significance. Vascular: Major intracranial vascular flow voids are maintained. Skull and upper cervical spine: Craniocervical junction within normal limits. Multilevel degenerative spondylosis noted within the visualized upper cervical spine with resultant mild-to-moderate spinal stenosis. Bone marrow signal intensity within normal limits. No scalp soft tissue abnormality. Sinuses/Orbits: Patient status post bilateral ocular lens replacement. Globes and orbital soft tissues demonstrate no acute finding. Mild scattered mucosal thickening noted throughout the paranasal sinuses. Trace bilateral mastoid effusions, of doubtful significance. Visualized nasopharynx within normal limits. Other: None. MRA  HEAD FINDINGS ANTERIOR CIRCULATION: Examination degraded by motion artifact. Visualized distal cervical segments of the internal carotid arteries are patent with symmetric antegrade flow. Petrous segments widely patent. Atheromatous irregularity throughout the carotid siphons with associated mild multifocal narrowing. A1 segments patent bilaterally. Normal anterior communicating artery complex. Anterior cerebral arteries patent to their distal aspects without high-grade stenosis. Short-segment severe stenosis at the distal left M1 segment/left MCA bifurcation (series 1036, image 12). Left MCA branches are diffusely irregular but perfused distally. Right M1 segment patent proximally. Focal signal cutoff at the mid-distal right M1 segment into the bifurcation (series 1036, image 11). This likely reflects a severe high-grade stenosis, as the right MCA branches are perfused distally. Diffuse atheromatous irregularity seen throughout the right MCA branches as well. Proximally. POSTERIOR CIRCULATION: Vertebral arteries largely code dominant and patent to the vertebrobasilar junction. Both PICA origins patent and normal. Basilar patent to its distal aspect without stenosis. Superior cerebral arteries patent bilaterally. Both PCAs primarily supplied via the basilar. Severe long segment stenosis involving the mid-distal right P2 segment (series 1046, image 15). Severe multifocal distal left  P2 and P3 stenoses present as well (series 1046, image 14). PCAs remain perfused to their distal aspects. No intracranial aneurysm or other vascular abnormality. IMPRESSION: MRI HEAD IMPRESSION: 1. Unchanged 1 cm hemorrhage centered at the posterior right lentiform nucleus/right internal capsule. No significant surrounding edema or regional mass effect. 2. 4 mm focus of diffusion abnormality involving the subcortical white matter of the right parietal corona radiata, consistent with a small acute to subacute small vessel type ischemic  infarct. No associated hemorrhage. 3. Underlying age-related cerebral atrophy with moderate chronic microvascular ischemic disease, mildly progressed as compared to 2018. MRA HEAD IMPRESSION: 1. Technically limited exam due to motion artifact. 2. Negative intracranial MRA for large vessel occlusion. 3. Focal severe high-grade stenoses involving the mid-distal right M1 segment and distal left M1 segment/left MCA bifurcation. Left MCA branches remain perfused distally. 4. Severe multifocal bilateral P2 and distal left P3 stenoses as above. Electronically Signed   By: Jeannine Boga M.D.   On: 07/13/2020 19:16   MR BRAIN WO CONTRAST  Result Date: 07/13/2020 CLINICAL DATA:  Follow-up examination for acute stroke. EXAM: MRI HEAD WITHOUT CONTRAST MRA HEAD WITHOUT CONTRAST TECHNIQUE: Multiplanar, multiecho pulse sequences of the brain and surrounding structures were obtained without intravenous contrast. Angiographic images of the head were obtained using MRA technique without contrast. COMPARISON:  Prior head CT from 07/12/2020 as well as previous brain MRI from 08/17/2016. FINDINGS: MRI HEAD FINDINGS Brain: Diffuse prominence of the CSF containing spaces compatible with generalized age-related cerebral atrophy. Patchy and confluent T2/FLAIR hyperintensity within the periventricular and deep white matter both cerebral hemispheres most likely related chronic microvascular ischemic disease. Patchy involvement of the pons. Overall, these changes are mildly progressed as compared to 2018. Few small remote lacunar type infarcts noted about the basal ganglia, most notable which present at the right caudothalamic groove. Previously identified hemorrhage centered at the posterior right lentiform nucleus/right internal capsule again seen, stable in size measuring up to approximately 1 cm in greatest dimension. This is somewhat difficult to visualized by MR, and is most notable on T1 weighted sequence (series 16, image  26). No significant surrounding edema or regional mass effect. No other acute intracranial hemorrhage. Few scattered chronic micro hemorrhages noted at the right cerebellum and pons, likely small vessel/hypertensive in nature. Single punctate 4 mm focus of diffusion abnormality involving the subcortical white matter of the right parietal corona radiata noted, likely a small acute to subacute small vessel type ischemic infarct (series 5, image 74). No associated hemorrhage or mass effect. No other diffusion abnormality to suggest acute or subacute ischemia. Gray-white matter differentiation otherwise maintained. No mass lesion, mass effect, or midline shift. No hydrocephalus or extra-axial fluid collection. Pituitary gland suprasellar region normal. Midline structures intact. 1.3 cm ovoid well-circumscribed lesion positioned at the left petrous apex demonstrating heterogeneous T2/FLAIR signal intensity with hyperintense T1 signal intensity noted (series 16, image 14), nonspecific, but could reflect a small cholesterol granuloma versus asymmetric fatty marrow. Finding is relatively stable from previous MRI from 2018, and of doubtful significance. Vascular: Major intracranial vascular flow voids are maintained. Skull and upper cervical spine: Craniocervical junction within normal limits. Multilevel degenerative spondylosis noted within the visualized upper cervical spine with resultant mild-to-moderate spinal stenosis. Bone marrow signal intensity within normal limits. No scalp soft tissue abnormality. Sinuses/Orbits: Patient status post bilateral ocular lens replacement. Globes and orbital soft tissues demonstrate no acute finding. Mild scattered mucosal thickening noted throughout the paranasal sinuses. Trace bilateral mastoid effusions, of doubtful significance.  Visualized nasopharynx within normal limits. Other: None. MRA HEAD FINDINGS ANTERIOR CIRCULATION: Examination degraded by motion artifact. Visualized distal  cervical segments of the internal carotid arteries are patent with symmetric antegrade flow. Petrous segments widely patent. Atheromatous irregularity throughout the carotid siphons with associated mild multifocal narrowing. A1 segments patent bilaterally. Normal anterior communicating artery complex. Anterior cerebral arteries patent to their distal aspects without high-grade stenosis. Short-segment severe stenosis at the distal left M1 segment/left MCA bifurcation (series 1036, image 12). Left MCA branches are diffusely irregular but perfused distally. Right M1 segment patent proximally. Focal signal cutoff at the mid-distal right M1 segment into the bifurcation (series 1036, image 11). This likely reflects a severe high-grade stenosis, as the right MCA branches are perfused distally. Diffuse atheromatous irregularity seen throughout the right MCA branches as well. Proximally. POSTERIOR CIRCULATION: Vertebral arteries largely code dominant and patent to the vertebrobasilar junction. Both PICA origins patent and normal. Basilar patent to its distal aspect without stenosis. Superior cerebral arteries patent bilaterally. Both PCAs primarily supplied via the basilar. Severe long segment stenosis involving the mid-distal right P2 segment (series 1046, image 15). Severe multifocal distal left P2 and P3 stenoses present as well (series 1046, image 14). PCAs remain perfused to their distal aspects. No intracranial aneurysm or other vascular abnormality. IMPRESSION: MRI HEAD IMPRESSION: 1. Unchanged 1 cm hemorrhage centered at the posterior right lentiform nucleus/right internal capsule. No significant surrounding edema or regional mass effect. 2. 4 mm focus of diffusion abnormality involving the subcortical white matter of the right parietal corona radiata, consistent with a small acute to subacute small vessel type ischemic infarct. No associated hemorrhage. 3. Underlying age-related cerebral atrophy with moderate  chronic microvascular ischemic disease, mildly progressed as compared to 2018. MRA HEAD IMPRESSION: 1. Technically limited exam due to motion artifact. 2. Negative intracranial MRA for large vessel occlusion. 3. Focal severe high-grade stenoses involving the mid-distal right M1 segment and distal left M1 segment/left MCA bifurcation. Left MCA branches remain perfused distally. 4. Severe multifocal bilateral P2 and distal left P3 stenoses as above. Electronically Signed   By: Jeannine Boga M.D.   On: 07/13/2020 19:16   DG Pelvis Portable  Result Date: 07/12/2020 CLINICAL DATA:  Right hip pain.  No reported injury. EXAM: PORTABLE PELVIS 1-2 VIEWS COMPARISON:  07/21/2018 outside pelvic radiographs FINDINGS: Partially visualized left hip hemiarthroplasty and partially visualized fixation hardware in the proximal right femur with no evidence of hardware fracture or loosening. No evidence of hip dislocation on this single frontal view. No osseous fracture. No focal osseous lesions. Moderate right hip osteoarthritis. No suspicious focal osseous lesions. No pelvic diastasis. IMPRESSION: No acute osseous abnormality. No evidence of hip dislocation on this single frontal view. Moderate right hip osteoarthritis. Partially visualized left hip hemiarthroplasty and proximal right femur fixation hardware, with no evidence of hardware complication. Electronically Signed   By: Ilona Sorrel M.D.   On: 07/12/2020 12:33   DG CHEST PORT 1 VIEW  Result Date: 07/15/2020 CLINICAL DATA:  COVID-19. EXAM: PORTABLE CHEST 1 VIEW COMPARISON:  07/12/2020. FINDINGS: Right dual-lumen catheter stable position. Stable cardiomegaly. Progressive bilateral pulmonary infiltrates/edema. Tiny bilateral pleural effusions cannot be excluded. No pneumothorax. Degenerative change thoracic spine. Surgical clips right chest. IMPRESSION: 1. Right dual-lumen catheter in stable position. 2. Stable cardiomegaly. 3. Progressive bilateral pulmonary  infiltrates/edema. Tiny bilateral pleural effusions cannot be excluded. Electronically Signed   By: Marcello Moores  Register   On: 07/15/2020 05:24   DG Abd Portable 1V  Result Date: 07/13/2020  CLINICAL DATA:  Evaluate orogastric tube EXAM: PORTABLE ABDOMEN - 1 VIEW COMPARISON:  July 13, 2020 FINDINGS: The OG tube side port and distal tip are in the region the stomach. IMPRESSION: The OG tube is in good position. Electronically Signed   By: Dorise Bullion III M.D   On: 07/13/2020 11:04   DG Abd Portable 1V  Result Date: 07/13/2020 CLINICAL DATA:  Vomiting. EXAM: PORTABLE ABDOMEN - 1 VIEW COMPARISON:  None. FINDINGS: No evidence of dilated bowel loops. Aortic atherosclerotic calcification noted. Compression screw noted in the right hip. Left hip prosthesis is also seen. IMPRESSION: Unremarkable bowel gas pattern.  No acute findings. Electronically Signed   By: Marlaine Hind M.D.   On: 07/13/2020 05:08   ECHOCARDIOGRAM COMPLETE  Result Date: 07/14/2020    ECHOCARDIOGRAM REPORT   Patient Name:   [name] Date of Exam: 07/14/2020 Medical Rec #:  [id number]     Height:       64.0 in Accession #:    [id number]    Weight:       174.6 lb Date of Birth:  [birth year]      BSA:          1.847 m Patient Age:    [age] years      BP:           114/75 mmHg Patient Gender: F             HR:           77 bpm. Exam Location:  Inpatient Procedure: 2D Echo, Color Doppler and Cardiac Doppler Indications:    Stroke 434.91 / I163.9  History:        Patient has no prior history of Echocardiogram examinations.                 Risk Factors:Dyslipidemia and Diabetes.  Sonographer:    Bernadene Person RDCS Referring Phys: [place]  1. Left ventricular ejection fraction, by estimation, is 60 to 65%. The left ventricle has normal function. The left ventricle has no regional wall motion abnormalities. There is moderate left ventricular hypertrophy. Left ventricular diastolic parameters are consistent with Grade I  diastolic dysfunction (impaired relaxation). Elevated left ventricular end-diastolic pressure. The E/e' is 27.  2. Right ventricular systolic function is normal. The right ventricular size is normal.  3. The pericardial effusion is posterior to the left ventricle.  4. The mitral valve is abnormal. Mild mitral valve regurgitation. Moderate mitral annular calcification.  5. The aortic valve is tricuspid. Aortic valve regurgitation is not visualized. Mild aortic valve sclerosis is present, with no evidence of aortic valve stenosis. Comparison(s): No prior Echocardiogram. FINDINGS  Left Ventricle: Left ventricular ejection fraction, by estimation, is 60 to 65%. The left ventricle has normal function. The left ventricle has no regional wall motion abnormalities. The left ventricular internal cavity size was normal in size. There is  moderate left ventricular hypertrophy. Left ventricular diastolic parameters are consistent with Grade I diastolic dysfunction (impaired relaxation). Elevated left ventricular end-diastolic pressure. The E/e' is 20. Right Ventricle: The right ventricular size is normal. No increase in right ventricular wall thickness. Right ventricular systolic function is normal. Left Atrium: Left atrial size was normal in size. Right Atrium: Right atrial size was normal in size. Pericardium: Trivial pericardial effusion is present. The pericardial effusion is posterior to the left ventricle. Mitral Valve: The mitral valve is abnormal. There is mild thickening of the mitral valve leaflet(s). Moderate mitral  annular calcification. Mild mitral valve regurgitation, with posteriorly-directed jet. Tricuspid Valve: The tricuspid valve is grossly normal. Tricuspid valve regurgitation is trivial. Aortic Valve: The aortic valve is tricuspid. Aortic valve regurgitation is not visualized. Mild aortic valve sclerosis is present, with no evidence of aortic valve stenosis. Pulmonic Valve: The pulmonic valve was normal in  structure. Pulmonic valve regurgitation is trivial. Aorta: The aortic root and ascending aorta are structurally normal, with no evidence of dilitation. Venous: The inferior vena cava was not well visualized. IAS/Shunts: No atrial level shunt detected by color flow Doppler.  LEFT VENTRICLE PLAX 2D LVIDd:         4.30 cm  Diastology LVIDs:         2.80 cm  LV e' medial:    5.66 cm/s LV PW:         1.20 cm  LV E/e' medial:  21.9 LV IVS:        1.20 cm  LV e' lateral:   6.20 cm/s LVOT diam:     2.10 cm  LV E/e' lateral: 20.0 LV SV:         103 LV SV Index:   56 LVOT Area:     3.46 cm  RIGHT VENTRICLE RV S prime:     13.90 cm/s TAPSE (M-mode): 1.8 cm LEFT ATRIUM             Index       RIGHT ATRIUM           Index LA diam:        3.30 cm 1.Karen cm/m  RA Area:     13.60 cm LA Vol (A2C):   51.0 ml 27.62 ml/m RA Volume:   28.50 ml  15.43 ml/m LA Vol (A4C):   50.1 ml 27.13 ml/m LA Biplane Vol: 51.9 ml 28.10 ml/m  AORTIC VALVE LVOT Vmax:   143.00 cm/s LVOT Vmean:  98.200 cm/s LVOT VTI:    0.296 m  AORTA Ao Root diam: 3.00 cm Ao Asc diam:  3.40 cm MITRAL VALVE                TRICUSPID VALVE MV Area (PHT): 3.21 cm     TR Peak grad:   23.2 mmHg MV Decel Time: 236 msec     TR Vmax:        241.00 cm/s MV E velocity: 124.00 cm/s MV A velocity: 120.00 cm/s  SHUNTS MV E/A ratio:  1.03         Systemic VTI:  0.30 m                             Systemic Diam: 2.10 cm Lyman Bishop MD Electronically signed by Lyman Bishop MD Signature Date/Time: 07/14/2020/2:10:04 PM    Final    CT HEAD CODE STROKE WO CONTRAST  Addendum Date: 07/12/2020   ADDENDUM REPORT: 07/12/2020 10:16 ADDENDUM: Correction.  Findings were discussed with Dr. Theda Sers. Electronically Signed   By: Margaretha Sheffield MD   On: 07/12/2020 10:16   Result Date: 07/12/2020 CLINICAL DATA:  Code stroke.  Neuro deficit, acute stroke suspected. EXAM: CT HEAD WITHOUT CONTRAST TECHNIQUE: Contiguous axial images were obtained from the base of the skull through the vertex  without intravenous contrast. COMPARISON:  MRI head August 17, 2016 FINDINGS: Brain: Acute hemorrhage in the right basal ganglia, centered in the region of the posterior putamen/posterior limb of the internal capsule. The hemorrhage measures approximately 7  x 4 by 6 mm. No evidence of acute large vascular territory infarct. Patchy white matter hypoattenuation, which is nonspecific but most likely related to chronic microvascular ischemic disease. Mild generalized atrophy. No hydrocephalus. No evidence of a mass lesion. No midline shift. Remote right cerebellar lacunar infarct. Vascular: Calcific atherosclerosis. Skull: No acute fracture. Sinuses/Orbits: Sinuses are clear.  Unremarkable orbits. Other: No mastoid effusion. IMPRESSION: 1. Acute 7 mm hemorrhage within the right basal ganglia. No substantial mass effect. 2. Chronic microvascular ischemic disease and remote right cerebellar lacunar infarct. Code stroke imaging results were communicated on 07/12/2020 at 10:00 am to provider Dr. Lorrin Goodell via telephone, who verbally acknowledged these results. Electronically Signed: By: Margaretha Sheffield MD On: 07/12/2020 10:05   VAS US CAROTID  Result Date: 07/14/2020 Carotid Arterial Duplex Study Indications:       CVA. Risk Factors:      Hyperlipidemia, Diabetes. Limitations        Today's exam was limited due to the patient's inability or                    unwillingness to cooperate. Comparison Study:  no prior Performing Technologist: Abram Sander RVS  Examination Guidelines: A complete evaluation includes B-mode imaging, spectral Doppler, color Doppler, and power Doppler as needed of all accessible portions of each vessel. Bilateral testing is considered an integral part of a complete examination. Limited examinations for reoccurring indications may be performed as noted.  Right Carotid Findings: +----------+--------+--------+--------+------------------+--------------+           PSV cm/sEDV  cm/sStenosisPlaque DescriptionComments       +----------+--------+--------+--------+------------------+--------------+ CCA Prox  54      9               heterogenous                     +----------+--------+--------+--------+------------------+--------------+ CCA Distal65      16              heterogenous                     +----------+--------+--------+--------+------------------+--------------+ ICA Prox  48      17      1-39%   heterogenous                     +----------+--------+--------+--------+------------------+--------------+ ICA Distal                                          Not visualized +----------+--------+--------+--------+------------------+--------------+ ECA       91      9                                                +----------+--------+--------+--------+------------------+--------------+ +----------+--------+-------+--------------------------+-------------------+           PSV cm/sEDV cmsDescribe                  Arm Pressure (mmHG) +----------+--------+-------+--------------------------+-------------------+ Subclavian               not visualized due to port                    +----------+--------+-------+--------------------------+-------------------+ +---------+--------+--------+--------------+ VertebralPSV cm/sEDV cm/sNot identified +---------+--------+--------+--------------+  Left Carotid Findings: +----------+--------+--------+--------+------------------+--------------+  PSV cm/sEDV cm/sStenosisPlaque DescriptionComments       +----------+--------+--------+--------+------------------+--------------+ CCA Prox  53      5               heterogenous                     +----------+--------+--------+--------+------------------+--------------+ CCA Distal73      10              heterogenous                     +----------+--------+--------+--------+------------------+--------------+ ICA Prox  127     16       1-39%   heterogenous                     +----------+--------+--------+--------+------------------+--------------+ ICA Distal                                          Not visualized +----------+--------+--------+--------+------------------+--------------+ ECA       220                                                      +----------+--------+--------+--------+------------------+--------------+ +----------+--------+--------+--------------+-------------------+           PSV cm/sEDV cm/sDescribe      Arm Pressure (mmHG) +----------+--------+--------+--------------+-------------------+ Subclavian                Not identified                    +----------+--------+--------+--------------+-------------------+ +---------+--------+--------+--------------+ VertebralPSV cm/sEDV cm/sNot identified +---------+--------+--------+--------------+   Summary: Right Carotid: Velocities in the right ICA are consistent with a 1-39% stenosis. Left Carotid: Velocities in the left ICA are consistent with a 1-39% stenosis. Vertebrals: Bilateral vertebral arteries were not visualized. *See table(s) above for measurements and observations.  Electronically signed by Antony Contras MD on 07/14/2020 at 1:04:27 PM.    Final     Microbiology: Recent Results (from the past 240 hour(s))  SARS CORONAVIRUS 2 (TAT 6-24 HRS) Nasopharyngeal Nasopharyngeal Swab     Status: Abnormal   Collection Time: 07/21/20  1:34 PM   Specimen: Nasopharyngeal Swab  Result Value Ref Range Status   SARS Coronavirus 2 POSITIVE (A) NEGATIVE Final    Comment: RESULT CALLED TO, READ BACK BY AND VERIFIED WITH: F.AFOR,RN @2142  07/21/2020 VANG.J (NOTE) SARS-CoV-2 target nucleic acids are DETECTED.  The SARS-CoV-2 RNA is generally detectable in upper and lower respiratory specimens during the acute phase of infection. Positive results are indicative of the presence of SARS-CoV-2 RNA. Clinical correlation with patient  history and other diagnostic information is  necessary to determine patient infection status. Positive results do not rule out bacterial infection or co-infection with other viruses.  The expected result is Negative.  Fact Sheet for Patients: SugarRoll.be  Fact Sheet for Healthcare Providers: https://www.woods-mathews.com/  This test is not yet approved or cleared by the [place] FDA and  has been authorized for detection and/or diagnosis of SARS-CoV-2 by FDA under an Emergency Use Authorization (EUA). This EUA will remain  in effect (meaning this test can be used) f or the duration of the COVID-19 declaration under Section 564(b)(1) of the Act, 21 U.S.C. section 360bbb-3(b)(1), unless the authorization  is terminated or revoked sooner.   Performed at [facility], [place] [address] [place], [place] [address]      Labs: Basic Metabolic Panel: Recent Labs  Lab 07/25/20 0924 07/27/20 0225 07/28/20 1212 07/30/20 1223  NA 133* 134* 132* 133*  K 4.1 4.5 3.5 4.2  CL 95* 96* 93* 96*  CO2 24 23 24 22   GLUCOSE 145* 232* 129* 180*  BUN 44* 43* 19 65*  CREATININE 5.88* 6.09* 3.20* 7.13*  CALCIUM 8.7* 9.3 9.0 9.3  PHOS 5.2* 5.5* 3.0 6.5*   Liver Function Tests: Recent Labs  Lab 07/25/20 0924 07/27/20 0225 07/28/20 1212 07/30/20 1223  ALBUMIN 2.8* 2.8* 3.2* 3.0*   CBC: Recent Labs  Lab 07/25/20 0924 07/27/20 0225 07/28/20 1212 07/30/20 1458  WBC 4.6 5.1 5.5 5.7  HGB 9.2* 9.7* 10.8* 9.6*  HCT 29.7* 29.2* 33.7* 30.2*  MCV 92.5 90.1 89.4 91.8  PLT 203 207 198 206    Recent Labs    07/17/20 0152 07/18/20 0050 07/19/20 0200  BNP 226.6* 179.8* 113.8*   CBG: Recent Labs  Lab 07/29/20 1156 07/29/20 1655 07/29/20 2107 07/30/20 0750 07/30/20 1202  GLUCAP 188* 176* 235* 128* 234*    Principal Problem:   Hemorrhagic stroke (HCC) Active Problems:   DM type 2 (diabetes mellitus, type 2) ([place])   CVA (cerebral  vascular accident) ([place])   Upper GI bleed   COVID-19 virus infection   Atrial fibrillation with RVR ([place])   ESRD on hemodialysis ([place])   Gastritis and gastroduodenitis   Time coordinating discharge: 50 minutes  Signed:  Murray Hodgkins, MD  [facility]  07/30/2020, 5:25 PM

## 2020-07-30 NOTE — Progress Notes (Signed)
Physical Therapy Treatment Patient Details Name: [name] MRN: [id number] DOB: [birth year] Today's Date: 07/30/2020    History of Present Illness Pt is a 80 y.o. female with initial COVID dx on 07/07/20 and underwent antibody infusion, admitted 07/12/20 as code stroke with L-side facial droop and flaccid paralysis. Workup revealed 7 mm hemorrhage in R basal ganglia; not a tPA or IR candidate. Pt also with new onset PAF, GIB. S/p EGD 12/23. PMH includes CA, DM, ESRD (HD MWF).    PT Comments    The patient appears improved in her MS, orientation and following. Patient ambulated in room  X 20' then 85' using RW. Noted " Limp" on right with stance. Patient will benefit from rehab post acute. Patient wilth mild 2-3/4 dyspnea on RA.   Follow Up Recommendations  SNF;Supervision for mobility/OOB     Equipment Recommendations  None recommended by PT    Recommendations for Other Services       Precautions / Restrictions Precautions Precautions: Fall    Mobility  Bed Mobility               General bed mobility comments: in recliner  Transfers Overall transfer level: Needs assistance Equipment used: Rolling walker (2 wheeled) Transfers: Sit to/from Stand Sit to Stand: Min guard;Min assist         General transfer comment: min guard from recliner, min assist from low toilet, cues to use rail in BR  Ambulation/Gait Ambulation/Gait assistance: Min assist Gait Distance (Feet): 90 Feet Assistive device: Rolling walker (2 wheeled) Gait Pattern/deviations: Narrow base of support;Step-through pattern;Drifts right/left;Decreased stride length Gait velocity: Decreased   General Gait Details: Slow, mildly unsteady gait with RW and min guard for balance; pt intermittently veering towards R-side and running into objects with RW, able to self-correct with increased time and effort; quick to fatigue requiring minA for stability; 1x LOB requiring min assist from feet tripping her..  Patient has toe in gait.modA to prevent fall   Stairs             Wheelchair Mobility    Modified Rankin (Stroke Patients Only)       Balance Overall balance assessment: Needs assistance Sitting-balance support: No upper extremity supported;Feet supported Sitting balance-Leahy Scale: Fair     Standing balance support: Bilateral upper extremity supported;During functional activity Standing balance-Leahy Scale: Poor Standing balance comment: UE support and/or external assist                            Cognition Arousal/Alertness: Awake/alert Behavior During Therapy: Flat affect                       Current Attention Level: Alternating       Awareness: Emergent   General Comments: patient alert and talkative. Improved processing and response times.      Exercises      General Comments        Pertinent Vitals/Pain Faces Pain Scale: Hurts a little bit Pain Location: abdomen- constipated Pain Descriptors / Indicators: Discomfort Pain Intervention(s): Monitored during session    Home Living                      Prior Function            PT Goals (current goals can now be found in the care plan section) Progress towards PT goals: Progressing toward goals    Frequency  Min 3X/week      PT Plan Current plan remains appropriate    Co-evaluation              AM-PAC PT "6 Clicks" Mobility   Outcome Measure  Help needed turning from your back to your side while in a flat bed without using bedrails?: A Little Help needed moving from lying on your back to sitting on the side of a flat bed without using bedrails?: A Little Help needed moving to and from a bed to a chair (including a wheelchair)?: A Little Help needed standing up from a chair using your arms (e.g., wheelchair or bedside chair)?: A Little Help needed to walk in hospital room?: A Little Help needed climbing 3-5 steps with a railing? : A Lot 6  Click Score: 17    End of Session Equipment Utilized During Treatment: Gait belt Activity Tolerance: Patient tolerated treatment well Patient left: in chair;with call bell/phone within reach Nurse Communication: Mobility status PT Visit Diagnosis: Other abnormalities of gait and mobility (R26.89);Other symptoms and signs involving the nervous system (R29.898)     Time: 1341-1400 PT Time Calculation (min) (ACUTE ONLY): 19 min  Charges:  $Gait Training: 8-22 mins                     [place] Pager [phone] Office [phone]    Claretha Cooper 07/30/2020, 3:09 PM

## 2020-07-30 NOTE — Progress Notes (Signed)
  [facility] Progress Note   Subjective:  Pt awaiting SNF bed.  Seen in room, NAD, feels well.  For HD today  Objective Vitals:   07/29/20 1300 07/29/20 2103 07/30/20 0633 07/30/20 0754  BP: 140/72 (!) 165/71 (!) 156/68   Pulse: 68 74 77   Resp: 17 16 18    Temp: 98 F (36.7 C) 98.1 F (36.7 C) 98.3 F (36.8 C)   TempSrc: Oral Axillary Axillary   SpO2: 98% 98% 100% 97%  Weight:      Height:       Physical Exam General: NAD RRR TDC No LEE S/nt/nd nonfocal    Additional Objective Labs: Basic Metabolic Panel: Recent Labs  Lab 07/25/20 0924 07/27/20 0225 07/28/20 1212  NA 133* 134* 132*  K 4.1 4.5 3.5  CL 95* 96* 93*  CO2 24 23 24   GLUCOSE 145* 232* 129*  BUN 44* 43* 19  CREATININE 5.88* 6.09* 3.20*  CALCIUM 8.7* 9.3 9.0  PHOS 5.2* 5.5* 3.0   Liver Function Tests: Recent Labs  Lab 07/25/20 0924 07/27/20 0225 07/28/20 1212  ALBUMIN 2.8* 2.8* 3.2*   CBC: Recent Labs  Lab 07/25/20 0924 07/27/20 0225 07/28/20 1212  WBC 4.6 5.1 5.5  HGB 9.2* 9.7* 10.8*  HCT 29.7* 29.2* 33.7*  MCV 92.5 90.1 89.4  PLT 203 207 198   Medications: . sodium chloride Stopped (07/17/20 1515)   . amiodarone  200 mg Oral Daily  . apixaban  2.5 mg Oral BID  . atorvastatin  20 mg Oral QHS  . chlorhexidine  15 mL Mouth Rinse BID  . Chlorhexidine Gluconate Cloth  6 each Topical Daily  . diltiazem  120 mg Oral Daily  . hydrALAZINE  25 mg Oral Q8H  . insulin aspart  0-15 Units Subcutaneous TID WC  . insulin aspart  0-5 Units Subcutaneous QHS  . insulin glargine  30 Units Subcutaneous Daily  . letrozole  2.5 mg Oral Daily  . mouth rinse  15 mL Mouth Rinse q12n4p  . midodrine  10 mg Oral Once  . pantoprazole  40 mg Oral BID    Dialysis Orders: MWF [facility] 3.5h   400/800 79kg 3K/2.25 bath P2 Hep 3500 RIJ TDC/ no AVF - calcitriol 0.5 ug tiw  Assessment/Plan: 1.  COVID-19 infection: Dx 12/13, s/p monoclonal antibody treatment on 12/17.  She was  unvaccinated. Resolved.  2.  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 Some residual right hand weakness noted. 3. ESRD/ volume: cont HD MWF. HD on schedule. Below dry wt, adjust at DC. BP's OK.  4.  GI bleed with acute blood loss anemia: Hgb 10.3.  S/p EGD 12/23 showing gastritis and duodenitis without any focal bleeding.  Ongoing medical management with PPI. 5. CKD-MBD: Ca and P ok,  7.  Paroxysmal atrial fibrillation with rapid ventricular response: Back in sinus rhythm/rate controlled and now back on Eliquis following transient heparin drip. 8.  Dispo -  Pending SNF   Rexene Agent , MD 07/30/2020, 12:07 PM

## 2020-07-30 NOTE — NC FL2 (Addendum)
[place] LEVEL OF CARE SCREENING TOOL     IDENTIFICATION  Patient Name: [name] Birthdate: 1940/07/28 Sex: female Admission Date (Current Location): 07/12/2020  [facility] and [place] Number:  [employer] and Address:  [facility] [place], [place] [address], [place], [place] [address]      Provider Number: [id number]  Attending Physician Name and Address:  Samuella Cota, MD  Relative Name and Phone Number:  Leota Sauers, [phone]    Current Level of Care: Hospital Recommended Level of Care: [place] Prior Approval Number:    Date Approved/Denied:   PASRR Number:    Discharge Plan: SNF    Current Diagnoses: Patient Active Problem List   Diagnosis Date Noted  . Gastritis and gastroduodenitis   . ESRD on hemodialysis ([place])   . Upper GI bleed   . Pneumonia due to COVID-19 virus   . Atrial fibrillation with RVR ([place])   . Right-sided nontraumatic intracerebral hemorrhage ([place])   . CVA (cerebral vascular accident) ([place]) 07/12/2020  . Fall 07/06/2018  . Left temporal lobe infarction ([place]) 09/05/2017  . TIA (transient ischemic attack) 09/04/2017  . CKD (chronic kidney disease) stage 3, GFR 30-59 ml/min (HCC) 09/03/2017  . Essential hypertension 09/03/2017  . Type 2 diabetes mellitus with stage 3 chronic kidney disease ([place]) 09/03/2017  . Lobular carcinoma of right breast ([place]) 03/01/2011    Orientation RESPIRATION BLADDER Height & Weight     Self,Time,Situation,Place  Normal Continent,External catheter Weight: 172 lb 9.9 oz (78.3 kg) Height:  5\' 4"  (162.6 cm)  BEHAVIORAL SYMPTOMS/MOOD NEUROLOGICAL BOWEL NUTRITION STATUS      Continent Diet (Please see DC Summary)  AMBULATORY STATUS COMMUNICATION OF NEEDS Skin   Limited Assist Verbally Normal                       Personal Care Assistance Level of Assistance  Bathing,Feeding,Dressing Bathing Assistance: Limited assistance Feeding  assistance: Independent Dressing Assistance: Limited assistance     Functional Limitations Info  Sight Sight Info: Impaired        SPECIAL CARE FACTORS FREQUENCY  PT (By licensed PT),OT (By licensed OT)     PT Frequency: 5x/week OT Frequency: 5x/week            Contractures Contractures Info: Not present    Additional Factors Info  Code Status,Allergies,Insulin Sliding Scale,Isolation Precautions Code Status Info: Full Allergies Info: NKA   Insulin Sliding Scale Info: see dc summary Isolation Precautions Info: COVID+     Current Medications (07/30/2020):  This is the current hospital active medication list Current Facility-Administered Medications  Medication Dose Route Frequency Provider Last Rate Last Admin  . 0.9 %  sodium chloride infusion   Intravenous PRN Sherrilyn Rist A, MD   Stopped at 07/17/20 1515  . amiodarone (PACERONE) tablet 200 mg  200 mg Oral Daily Kathyrn Drown D, NP   200 mg at 07/30/20 0909  . apixaban (ELIQUIS) tablet 2.5 mg  2.5 mg Oral BID Thea Gist, RPH   2.5 mg at 07/30/20 0910  . atorvastatin (LIPITOR) tablet 20 mg  20 mg Oral QHS Thurnell Lose, MD   20 mg at 07/29/20 2128  . chlorhexidine (PERIDEX) 0.12 % solution 15 mL  15 mL Mouth Rinse BID Brand Males, MD   15 mL at 07/30/20 0909  . Chlorhexidine Gluconate Cloth 2 % PADS 6 each  6 each Topical Daily Candee Furbish, MD  6 each at 07/30/20 0910  . diltiazem (CARDIZEM CD) 24 hr capsule 120 mg  120 mg Oral Daily Kathyrn Drown D, NP   120 mg at 07/30/20 0910  . docusate sodium (COLACE) capsule 100 mg  100 mg Oral BID PRN Olalere, Adewale A, MD      . guaiFENesin-dextromethorphan (ROBITUSSIN DM) 100-10 MG/5ML syrup 10 mL  10 mL Oral Q6H PRN Thurnell Lose, MD   10 mL at 07/24/20 1634  . heparin sodium (porcine) 1000 UNIT/ML injection           . heparin sodium (porcine) injection 1,000 Units  1,000 Units Intravenous Q dialysis Madelon Lips, MD   3,800 Units at 07/28/20  1108  . hydrALAZINE (APRESOLINE) tablet 25 mg  25 mg Oral Q8H Thurnell Lose, MD   25 mg at 07/29/20 2128  . insulin aspart (novoLOG) injection 0-15 Units  0-15 Units Subcutaneous TID WC Pham, Minh Q, RPH-CPP   5 Units at 07/30/20 1207  . insulin aspart (novoLOG) injection 0-5 Units  0-5 Units Subcutaneous QHS Elgergawy, Silver Huguenin, MD   2 Units at 07/29/20 2128  . insulin glargine (LANTUS) injection 30 Units  30 Units Subcutaneous Daily Darliss Cheney, MD   30 Units at 07/30/20 0909  . letrozole [facility]) tablet 2.5 mg  2.5 mg Oral Daily Elgergawy, Silver Huguenin, MD   2.5 mg at 07/30/20 0910  . MEDLINE mouth rinse  15 mL Mouth Rinse q12n4p Brand Males, MD   15 mL at 07/30/20 1207  . metoprolol tartrate (LOPRESSOR) injection 2.5 mg  2.5 mg Intravenous Q6H PRN Brand Males, MD   2.5 mg at 07/15/20 1044  . midodrine (PROAMATINE) tablet 10 mg  10 mg Oral Once Thurnell Lose, MD      . ondansetron [facility]) injection 4 mg  4 mg Intravenous Q6H PRN Anders Simmonds, MD   4 mg at 07/17/20 1452  . pantoprazole (PROTONIX) EC tablet 40 mg  40 mg Oral BID Vena Rua, PA-C   40 mg at 07/30/20 2297  . polyethylene glycol (MIRALAX / GLYCOLAX) packet 17 g  17 g Oral Daily PRN Olalere, Adewale A, MD   17 g at 07/19/20 1354  . sodium chloride flush (NS) 0.9 % injection 10-40 mL  10-40 mL Intracatheter PRN Thurnell Lose, MD         Discharge Medications: Please see discharge summary for a list of discharge medications.  Relevant Imaging Results:  Relevant Lab Results:   Additional Information SSN: [id number]. Requires Dialysis transport, to [facility] MWF 12:05pm.  Benard Halsted, LCSW

## 2020-07-30 NOTE — TOC Transition Note (Signed)
Transition of Care [facility]) - CM/SW Discharge Note   Patient Details  Name: [name] MRN: [id number] Date of Birth: [birth year]  Transition of Care [facility]) CM/SW Contact:  Benard Halsted, LCSW Phone Number: 07/30/2020, 5:29 PM   Clinical Narrative:    Patient will DC to: [facility] [place] Anticipated DC date: 07/30/20 Family notified: Spouse, dtr, granddaughter Transport by: RN to call PTAR after dialysis [phone])   Per MD patient ready for DC to [facility]. RN to call report prior to discharge [phone]). RN, patient, patient's family, and facility notified of DC. Discharge Summary and FL2 sent to facility. DC packet on chart with unit secretary.  CSW will sign off for now as social work intervention is no longer needed. Please consult [place] again if new needs arise.      Final next level of care: Skilled Nursing Facility Barriers to Discharge: Barriers Resolved   Patient Goals and CMS Choice Patient states their goals for this hospitalization and ongoing recovery are:: rehab CMS Medicare.gov Compare Post Acute Care list provided to:: Patient Choice offered to / list presented to : Patient  Discharge Placement   Existing PASRR number confirmed : 07/30/20          Patient chooses bed at: [facility] Patient to be transferred to facility by: [place] Name of family member notified: Spouse, dtr, and granddaughter Patient and family notified of of transfer: 07/30/20  Discharge Plan and Services In-house Referral: Clinical Social Work                                   Social Determinants of Health ([place]) Interventions     Readmission Risk Interventions No flowsheet data found.

## 2020-07-30 NOTE — Progress Notes (Signed)
Patient returned to unit from dialysis, c/o severe constipation, states she is miserable, I secure messaged Dr Sarajane Jews and he ordered senna and miralax, patient was concerned this would not work right away and she was going to be discharged to rehab facility, I messaged Dr Sarajane Jews back and he stated it was okay to still discharge patient to do disimpaction before discharge, patient was eating dinner and we were changing shifts at this point, I relayed all of this information to the oncoming nurse.

## 2020-07-30 NOTE — TOC Progression Note (Addendum)
Transition of Care [facility]) - Progression Note    Patient Details  Name: [name] MRN: [id number] Date of Birth: 1940/09/23  Transition of Care [facility]) CM/SW [place], LCSW Phone Number: 07/30/2020, 3:14 PM  Clinical Narrative:    CSW was notified by [facility] that they have received insurance authorization and can accept patient after dialysis today. CSW notified patient's spouse (and daughter was present with him) and granddaughter [name]. Patient will require PTAR.       Barriers to Discharge: Continued Medical Work up,SNF Pending bed offer,Insurance Authorization  Expected Discharge Plan and Services   In-house Referral: Clinical Social Work     Living arrangements for the past 2 months: Single Family Home Expected Discharge Date: 07/22/20                                     Social Determinants of Health (SDOH) Interventions    Readmission Risk Interventions No flowsheet data found.

## 2020-07-31 DIAGNOSIS — K922 Gastrointestinal hemorrhage, unspecified: Secondary | ICD-10-CM | POA: Diagnosis not present

## 2020-07-31 DIAGNOSIS — D0501 Lobular carcinoma in situ of right breast: Secondary | ICD-10-CM | POA: Diagnosis not present

## 2020-07-31 DIAGNOSIS — E1165 Type 2 diabetes mellitus with hyperglycemia: Secondary | ICD-10-CM | POA: Diagnosis not present

## 2020-07-31 DIAGNOSIS — M2550 Pain in unspecified joint: Secondary | ICD-10-CM | POA: Diagnosis not present

## 2020-07-31 DIAGNOSIS — R7309 Other abnormal glucose: Secondary | ICD-10-CM | POA: Diagnosis not present

## 2020-07-31 DIAGNOSIS — Z992 Dependence on renal dialysis: Secondary | ICD-10-CM | POA: Diagnosis not present

## 2020-07-31 DIAGNOSIS — I517 Cardiomegaly: Secondary | ICD-10-CM | POA: Diagnosis not present

## 2020-07-31 DIAGNOSIS — N2581 Secondary hyperparathyroidism of renal origin: Secondary | ICD-10-CM | POA: Diagnosis not present

## 2020-07-31 DIAGNOSIS — I6523 Occlusion and stenosis of bilateral carotid arteries: Secondary | ICD-10-CM | POA: Diagnosis not present

## 2020-07-31 DIAGNOSIS — I619 Nontraumatic intracerebral hemorrhage, unspecified: Secondary | ICD-10-CM | POA: Diagnosis not present

## 2020-07-31 DIAGNOSIS — I10 Essential (primary) hypertension: Secondary | ICD-10-CM | POA: Diagnosis not present

## 2020-07-31 DIAGNOSIS — G459 Transient cerebral ischemic attack, unspecified: Secondary | ICD-10-CM | POA: Diagnosis not present

## 2020-07-31 DIAGNOSIS — K219 Gastro-esophageal reflux disease without esophagitis: Secondary | ICD-10-CM | POA: Diagnosis not present

## 2020-07-31 DIAGNOSIS — Z9011 Acquired absence of right breast and nipple: Secondary | ICD-10-CM | POA: Diagnosis not present

## 2020-07-31 DIAGNOSIS — Z09 Encounter for follow-up examination after completed treatment for conditions other than malignant neoplasm: Secondary | ICD-10-CM | POA: Diagnosis not present

## 2020-07-31 DIAGNOSIS — R5381 Other malaise: Secondary | ICD-10-CM | POA: Diagnosis not present

## 2020-07-31 DIAGNOSIS — Z7401 Bed confinement status: Secondary | ICD-10-CM | POA: Diagnosis not present

## 2020-07-31 DIAGNOSIS — N390 Urinary tract infection, site not specified: Secondary | ICD-10-CM | POA: Diagnosis not present

## 2020-07-31 DIAGNOSIS — N186 End stage renal disease: Secondary | ICD-10-CM | POA: Diagnosis not present

## 2020-07-31 DIAGNOSIS — E785 Hyperlipidemia, unspecified: Secondary | ICD-10-CM | POA: Diagnosis not present

## 2020-07-31 DIAGNOSIS — I480 Paroxysmal atrial fibrillation: Secondary | ICD-10-CM | POA: Diagnosis not present

## 2020-07-31 DIAGNOSIS — I4891 Unspecified atrial fibrillation: Secondary | ICD-10-CM | POA: Diagnosis not present

## 2020-07-31 NOTE — Progress Notes (Addendum)
[facility] called and report given. PTAR called for transport.

## 2020-07-31 NOTE — Progress Notes (Signed)
SNF [place]: [id number]

## 2020-08-01 DIAGNOSIS — N186 End stage renal disease: Secondary | ICD-10-CM | POA: Diagnosis not present

## 2020-08-01 DIAGNOSIS — Z992 Dependence on renal dialysis: Secondary | ICD-10-CM | POA: Diagnosis not present

## 2020-08-01 DIAGNOSIS — N2581 Secondary hyperparathyroidism of renal origin: Secondary | ICD-10-CM | POA: Diagnosis not present

## 2020-08-04 DIAGNOSIS — Z992 Dependence on renal dialysis: Secondary | ICD-10-CM | POA: Diagnosis not present

## 2020-08-04 DIAGNOSIS — N2581 Secondary hyperparathyroidism of renal origin: Secondary | ICD-10-CM | POA: Diagnosis not present

## 2020-08-04 DIAGNOSIS — N186 End stage renal disease: Secondary | ICD-10-CM | POA: Diagnosis not present

## 2020-08-05 DIAGNOSIS — I10 Essential (primary) hypertension: Secondary | ICD-10-CM | POA: Diagnosis not present

## 2020-08-05 DIAGNOSIS — I480 Paroxysmal atrial fibrillation: Secondary | ICD-10-CM | POA: Diagnosis not present

## 2020-08-05 DIAGNOSIS — N186 End stage renal disease: Secondary | ICD-10-CM | POA: Diagnosis not present

## 2020-08-05 DIAGNOSIS — K219 Gastro-esophageal reflux disease without esophagitis: Secondary | ICD-10-CM | POA: Diagnosis not present

## 2020-08-05 DIAGNOSIS — R5381 Other malaise: Secondary | ICD-10-CM | POA: Diagnosis not present

## 2020-08-05 DIAGNOSIS — E785 Hyperlipidemia, unspecified: Secondary | ICD-10-CM | POA: Diagnosis not present

## 2020-08-05 DIAGNOSIS — Z992 Dependence on renal dialysis: Secondary | ICD-10-CM | POA: Diagnosis not present

## 2020-08-05 DIAGNOSIS — I619 Nontraumatic intracerebral hemorrhage, unspecified: Secondary | ICD-10-CM | POA: Diagnosis not present

## 2020-08-06 DIAGNOSIS — Z992 Dependence on renal dialysis: Secondary | ICD-10-CM | POA: Diagnosis not present

## 2020-08-06 DIAGNOSIS — N2581 Secondary hyperparathyroidism of renal origin: Secondary | ICD-10-CM | POA: Diagnosis not present

## 2020-08-06 DIAGNOSIS — N186 End stage renal disease: Secondary | ICD-10-CM | POA: Diagnosis not present

## 2020-08-08 DIAGNOSIS — N2581 Secondary hyperparathyroidism of renal origin: Secondary | ICD-10-CM | POA: Diagnosis not present

## 2020-08-08 DIAGNOSIS — N186 End stage renal disease: Secondary | ICD-10-CM | POA: Diagnosis not present

## 2020-08-08 DIAGNOSIS — Z992 Dependence on renal dialysis: Secondary | ICD-10-CM | POA: Diagnosis not present

## 2020-08-11 DIAGNOSIS — Z992 Dependence on renal dialysis: Secondary | ICD-10-CM | POA: Diagnosis not present

## 2020-08-11 DIAGNOSIS — N186 End stage renal disease: Secondary | ICD-10-CM | POA: Diagnosis not present

## 2020-08-11 DIAGNOSIS — N2581 Secondary hyperparathyroidism of renal origin: Secondary | ICD-10-CM | POA: Diagnosis not present

## 2020-08-11 NOTE — Progress Notes (Signed)
Cardiology Clinic Note   Patient Name: [name] Date of Encounter: 08/14/2020  Primary Care Provider:  [name], MD Primary Cardiologist:  Quay Burow, MD  Patient Profile    Karen Grief. [place] 80 year old female presents the clinic today for follow-up evaluation of her essential hypertension, paroxysmal atrial fibrillation, and GI bleeding.  Past Medical History    Past Medical History:  Diagnosis Date  . Cancer ([place])   . Diabetes mellitus   . Hyperlipidemia   . Hypertension    pt states at times   Past Surgical History:  Procedure Laterality Date  . BIOPSY  07/17/2020   Procedure: BIOPSY;  Surgeon: Yetta Flock, MD;  Location: [facility] ENDOSCOPY;  Service: Gastroenterology;;  . BREAST LUMPECTOMY  03/26/2011   right  . ESOPHAGOGASTRODUODENOSCOPY (EGD) WITH PROPOFOL N/A 07/17/2020   Procedure: ESOPHAGOGASTRODUODENOSCOPY (EGD) WITH PROPOFOL;  Surgeon: Yetta Flock, MD;  Location: [place];  Service: Gastroenterology;  Laterality: N/A;    Allergies  No Known Allergies  History of Present Illness     [name] has a PMH of breast cancer status post lobectomy 2012, CVA with temporal lobe infarct, ESRD on HD, diabetes mellitus type 2, HLD, and hypertension.  She also had a positive COVID-19 swab 07/07/2020 due to a home exposure.  She underwent monoclonal antibody infusion 1 day prior to her hospital presentation.  At 4 AM the following morning EMS was contacted with concerns for GI bleed.  She refused transport at that time and was felt to be neurologically intact.  The following morning EMS was called again, and it was noted that she had left-sided facial droop and flaccid paralysis.  She was noted to have a large amount of black stool in her depends.  On EKG she was noted to be in atrial fibrillation.  Head CT showed acute 7 mm hemorrhage of her right basal ganglia and she was not a candidate for tPA.  Her systolic blood pressure was in the 80s and she  was given a 1 L normal saline bolus along with Protonix.  She received 2 units of PRBCs.  Mali Vascor calculated at 7 however she is not a candidate due to bleeding issues.  Neurology was also consulted.    An MRI of the brain showed unchanged hemorrhage.  Echocardiogram 07/14/2020 showed normal LVEF, no R WMA, LVH, G1 DD posterior left ventricle pericardial effusion, and mild MR.  Carotid Dopplers showed bilateral 1-39% stenosis.  Neurology signed off 07/14/2020 and she was started on 81 mg aspirin with a plan for long-term anticoagulation and 2-3 weeks after elective upper endoscopy to rule out cause of bleeding prior to decision for [facility].  Nephrology was also consulted for guidance with hemodialysis.  She was seen as a cardiology consult by Dr. Gwenlyn Found on 07/16/2020.  She was found to have atrial fibrillation in setting of COVID-19.  She had a GI bleed and CNS bleed.  She was not a candidate for anticoagulation.  She did receive amiodarone and diltiazem.  She converted to sinus rhythm and no medication changes were made.  She presents the clinic today for follow-up evaluation with her daughter and states she feels well.  She has been doing physical therapy at [facility].  She reports that she is now able to do all of her ADLs on her own.  She will be moving back home tomorrow.  She continues to follow a heart healthy renal diet.  She reports no issues with bleeding.  I will give her the salty 6 diet sheet, have her continue to increase her physical activity as tolerated, and have her follow-up in 3 months.  Today she denies chest pain, shortness of breath, lower extremity edema, fatigue, palpitations, melena, hematuria, hemoptysis, diaphoresis, weakness, presyncope, syncope, orthopnea, and PND.    Home Medications    Prior to Admission medications   Medication Sig Start Date End Date Taking? Authorizing Provider  amiodarone (PACERONE) 200 MG tablet Take 1 tablet (200 mg total) by mouth daily.  07/22/20   Thurnell Lose, MD  apixaban (ELIQUIS) 2.5 MG TABS tablet Take 1 tablet (2.5 mg total) by mouth 2 (two) times daily. 07/22/20   Thurnell Lose, MD  ascorbic acid (VITAMIN C) 500 MG tablet Take 500 mg by mouth 2 (two) times daily.    [provider]  atorvastatin (LIPITOR) 20 MG tablet Take 20 mg by mouth at bedtime.    [provider]  Calcium Carbonate (CALTRATE 600 PO) Take 2 tablets by mouth daily.    [provider]  Cholecalciferol (VITAMIN D) 2000 UNITS tablet Take 2,000 Units by mouth daily.    [provider]  diltiazem (CARDIZEM CD) 120 MG 24 hr capsule Take 1 capsule (120 mg total) by mouth daily. 07/22/20   Thurnell Lose, MD  glipiZIDE (GLUCOTROL XL) 2.5 MG 24 hr tablet Take 2.5 mg by mouth daily. 03/13/20   [provider]  hydrALAZINE (APRESOLINE) 25 MG tablet Take 1 tablet (25 mg total) by mouth 2 (two) times daily. 04/20/19   Nicholas Lose, MD  Insulin Degludec-Liraglutide (XULTOPHY) 100-3.6 UNIT-MG/ML SOPN Inject 44 Units into the skin every morning.    [provider]  letrozole (FEMARA) 2.5 MG tablet Take 1 tablet (2.5 mg total) by mouth daily. Patient taking differently: Take 2.5 mg by mouth at bedtime. 04/21/20   Nicholas Lose, MD  pantoprazole (PROTONIX) 40 MG tablet Take 1 tablet (40 mg total) by mouth 2 (two) times daily. 07/22/20   Thurnell Lose, MD  sevelamer carbonate (RENVELA) 800 MG tablet Take 800 mg by mouth 3 (three) times daily with meals. 05/20/20   [provider]  zinc sulfate 220 (50 Zn) MG capsule Take 220 mg by mouth 2 (two) times daily.    [provider]    Family History    Family History  Problem Relation Age of Onset  . Cancer Father        bronchial  . Cancer Paternal Aunt        breast  . Cancer Paternal Aunt        breast   She indicated that her mother is deceased. She indicated that her father is deceased. She indicated that only one of her two  brothers is alive. She indicated that both of her paternal aunts are deceased.  Social History    Social History   Socioeconomic History  . Marital status: Married    Spouse name: Not on file  . Number of children: Not on file  . Years of education: Not on file  . Highest education level: Not on file  Occupational History  . Not on file  Tobacco Use  . Smoking status: Never Smoker  . Smokeless tobacco: Never Used  Vaping Use  . Vaping Use: Never used  Substance and Sexual Activity  . Alcohol use: No  . Drug use: No  . Sexual activity: Not Currently  Other Topics Concern  . Not on file  Social  History Narrative  . Not on file   Social Determinants of Health   Financial Resource Strain: Not on file  Food Insecurity: Not on file  Transportation Needs: Not on file  Physical Activity: Not on file  Stress: Not on file  Social Connections: Not on file  Intimate Partner Violence: Not on file     Review of Systems    General:  No chills, fever, night sweats or weight changes.  Cardiovascular:  No chest pain, dyspnea on exertion, edema, orthopnea, palpitations, paroxysmal nocturnal dyspnea. Dermatological: No rash, lesions/masses Respiratory: No cough, dyspnea Urologic: No hematuria, dysuria Abdominal:   No nausea, vomiting, diarrhea, bright red blood per rectum, melena, or hematemesis Neurologic:  No visual changes, wkns, changes in mental status. All other systems reviewed and are otherwise negative except as noted above.  Physical Exam    VS:  BP 136/82   Pulse 86   Ht 5\' 4"  (1.626 m)   Wt 161 lb 6.4 oz (73.2 kg)   SpO2 96%   BMI 27.70 kg/m  , BMI Body mass index is 27.7 kg/m. GEN: Well nourished, well developed, in no acute distress. HEENT: normal. Neck: Supple, no JVD, carotid bruits, or masses. Cardiac: RRR, no murmurs, rubs, or gallops. No clubbing, cyanosis, edema.  Radials/DP/PT 2+ and equal bilaterally.  Respiratory:  Respirations regular and unlabored,  clear to auscultation bilaterally. GI: Soft, nontender, nondistended, BS + x 4. MS: no deformity or atrophy. Skin: warm and dry, no rash. Neuro:  Strength and sensation are intact. Psych: Normal affect.  Accessory Clinical Findings    Recent Labs: 07/16/2020: TSH 1.219 07/19/2020: ALT 31; B Natriuretic Peptide 113.8; Magnesium 1.8 07/30/2020: BUN 65; Creatinine, Ser 7.13; Hemoglobin 9.6; Platelets 206; Potassium 4.2; Sodium 133   Recent Lipid Panel    Component Value Date/Time   CHOL 99 07/14/2020 0232   TRIG 178 (H) 07/14/2020 0232   HDL 31 (L) 07/14/2020 0232   CHOLHDL 3.2 07/14/2020 0232   VLDL 36 07/14/2020 0232   LDLCALC 32 07/14/2020 0232    ECG personally reviewed by me today-none today.  Echocardiogram 07/14/2020  Left ventricular ejection fraction, by estimation, is 60 to 65%. The  left ventricle has normal function. The left ventricle has no regional  wall motion abnormalities. There is moderate left ventricular hypertrophy.  Left ventricular diastolic  parameters are consistent with Grade I diastolic dysfunction (impaired  relaxation). Elevated left ventricular end-diastolic pressure. The E/e' is  45.  2. Right ventricular systolic function is normal. The right ventricular  size is normal.  3. The pericardial effusion is posterior to the left ventricle.  4. The mitral valve is abnormal. Mild mitral valve regurgitation.  Moderate mitral annular calcification.  5. The aortic valve is tricuspid. Aortic valve regurgitation is not  visualized. Mild aortic valve sclerosis is present, with no evidence of  aortic valve stenosis.   Comparison(s): No prior Echocardiogram.   Assessment & Plan   1.  New onset paroxysmal atrial fibrillation- heart rate today 86 bpm.  Was noted to have atrial fibrillation in the setting of positive COVID-19 infection.  Converted to sinus rhythm after receiving diltiazem and amiodarone.  Echocardiogram showed normal LVEF LVH, G1 DD, and  posterior left atrial pericardial effusion with mild MR. Denies bleeding issues.  This patients CHA2DS2-VASc Score and unadjusted Ischemic Stroke Rate (% per year) is equal to 11.2 % stroke rate/year from a score of 7 HTN, DM, Age2, Female, Stroke2 Continue  amiodarone, apixaban, diltiazem Heart healthy  low-sodium diet-salty 6 given Increase physical activity as tolerated  Carotid artery disease- carotid ultrasound showed bilateral 1-39% carotid stenosis Continue atorvastatin Heart healthy low-sodium diet-salty 6 given Increase physical activity as tolerated Repeat carotid ultrasound 12/22  Essential hypertension-BP today 136/82.  Well-controlled at home. Continue diltiazem, hydralazine Heart healthy low-sodium diet-salty 6 given Increase physical activity as tolerated  Upper GI bleed- underwent EGD 07/17/2020 which showed duodenitis and clear base duodenal ulcer.  She was restarted on apixaban as per GI at that time and continued on PPI. Continue Protonix Follows with PCP  End-stage renal disease- reports compliance with HD. Follows with nephrology  Disposition: Follow-up with Dr. Gwenlyn Found or me in 3 months.  Jossie Ng. Carry Weesner NP-C    08/14/2020, 3:26 PM [place] [address] Office [phone] Fax [phone]  Notice: This dictation was prepared with Dragon dictation along with smaller phrase technology. Any transcriptional errors that result from this process are unintentional and may not be corrected upon review.  I spent 15 minutes examining this patient, reviewing medications, and using patient centered shared decision making involving her cardiac care.  Prior to her visit I spent greater than 20 minutes reviewing her past medical history,  medications, and prior cardiac tests.

## 2020-08-13 DIAGNOSIS — I480 Paroxysmal atrial fibrillation: Secondary | ICD-10-CM | POA: Diagnosis not present

## 2020-08-13 DIAGNOSIS — N2581 Secondary hyperparathyroidism of renal origin: Secondary | ICD-10-CM | POA: Diagnosis not present

## 2020-08-13 DIAGNOSIS — N186 End stage renal disease: Secondary | ICD-10-CM | POA: Diagnosis not present

## 2020-08-13 DIAGNOSIS — Z992 Dependence on renal dialysis: Secondary | ICD-10-CM | POA: Diagnosis not present

## 2020-08-13 DIAGNOSIS — E785 Hyperlipidemia, unspecified: Secondary | ICD-10-CM | POA: Diagnosis not present

## 2020-08-13 DIAGNOSIS — I619 Nontraumatic intracerebral hemorrhage, unspecified: Secondary | ICD-10-CM | POA: Diagnosis not present

## 2020-08-13 DIAGNOSIS — I10 Essential (primary) hypertension: Secondary | ICD-10-CM | POA: Diagnosis not present

## 2020-08-13 DIAGNOSIS — K219 Gastro-esophageal reflux disease without esophagitis: Secondary | ICD-10-CM | POA: Diagnosis not present

## 2020-08-13 DIAGNOSIS — R5381 Other malaise: Secondary | ICD-10-CM | POA: Diagnosis not present

## 2020-08-14 ENCOUNTER — Ambulatory Visit (INDEPENDENT_AMBULATORY_CARE_PROVIDER_SITE_OTHER): Payer: Medicare HMO | Admitting: General Practice

## 2020-08-14 ENCOUNTER — Encounter: Payer: Self-pay | Admitting: General Practice

## 2020-08-14 ENCOUNTER — Other Ambulatory Visit: Payer: Self-pay

## 2020-08-14 VITALS — BP 136/82 | HR 86 | Ht 64.0 in | Wt 161.4 lb

## 2020-08-14 DIAGNOSIS — K922 Gastrointestinal hemorrhage, unspecified: Secondary | ICD-10-CM

## 2020-08-14 DIAGNOSIS — I480 Paroxysmal atrial fibrillation: Secondary | ICD-10-CM | POA: Diagnosis not present

## 2020-08-14 DIAGNOSIS — I10 Essential (primary) hypertension: Secondary | ICD-10-CM | POA: Diagnosis not present

## 2020-08-14 DIAGNOSIS — I6523 Occlusion and stenosis of bilateral carotid arteries: Secondary | ICD-10-CM

## 2020-08-14 DIAGNOSIS — N186 End stage renal disease: Secondary | ICD-10-CM

## 2020-08-14 NOTE — Patient Instructions (Signed)
Medication Instructions:  The current medical regimen is effective;  continue present plan and medications as directed. Please refer to the Current Medication list given to you today.  *If you need a refill on your cardiac medications before your next appointment, please call your pharmacy*  Lab Work:   Testing/Procedures:  NONE    NONE  Special Instructions PLEASE READ AND FOLLOW SALTY 6-ATTACHED-1,800mg  daily  PLEASE INCREASE PHYSICAL ACTIVITY AS TOLERATED  Follow-Up: Your next appointment:  3 month(s) In Person with Quay Burow, MD OR IF UNAVAILABLE [place], FNP-C  At [facility], you and your health needs are our priority.  As part of our continuing mission to provide you with exceptional heart care, we have created designated Provider Care Teams.  These Care Teams include your primary Cardiologist (physician) and Advanced Practice Providers (APPs -  Physician Assistants and Nurse Practitioners) who all work together to provide you with the care you need, when you need it.  We recommend signing up for the patient portal called "MyChart".  Sign up information is provided on this After Visit Summary.  MyChart is used to connect with patients for Virtual Visits (Telemedicine).  Patients are able to view lab/test results, encounter notes, upcoming appointments, etc.  Non-urgent messages can be sent to your provider as well.   To learn more about what you can do with MyChart, go to [email or web address].              6 SALTY THINGS TO AVOID     1,800MG  DAILY

## 2020-08-15 DIAGNOSIS — N2581 Secondary hyperparathyroidism of renal origin: Secondary | ICD-10-CM | POA: Diagnosis not present

## 2020-08-15 DIAGNOSIS — N186 End stage renal disease: Secondary | ICD-10-CM | POA: Diagnosis not present

## 2020-08-15 DIAGNOSIS — Z992 Dependence on renal dialysis: Secondary | ICD-10-CM | POA: Diagnosis not present

## 2020-08-17 DIAGNOSIS — D631 Anemia in chronic kidney disease: Secondary | ICD-10-CM | POA: Diagnosis not present

## 2020-08-17 DIAGNOSIS — U071 COVID-19: Secondary | ICD-10-CM | POA: Diagnosis not present

## 2020-08-17 DIAGNOSIS — I480 Paroxysmal atrial fibrillation: Secondary | ICD-10-CM | POA: Diagnosis not present

## 2020-08-17 DIAGNOSIS — K922 Gastrointestinal hemorrhage, unspecified: Secondary | ICD-10-CM | POA: Diagnosis not present

## 2020-08-17 DIAGNOSIS — I619 Nontraumatic intracerebral hemorrhage, unspecified: Secondary | ICD-10-CM | POA: Diagnosis not present

## 2020-08-17 DIAGNOSIS — I120 Hypertensive chronic kidney disease with stage 5 chronic kidney disease or end stage renal disease: Secondary | ICD-10-CM | POA: Diagnosis not present

## 2020-08-17 DIAGNOSIS — E785 Hyperlipidemia, unspecified: Secondary | ICD-10-CM | POA: Diagnosis not present

## 2020-08-17 DIAGNOSIS — K219 Gastro-esophageal reflux disease without esophagitis: Secondary | ICD-10-CM | POA: Diagnosis not present

## 2020-08-17 DIAGNOSIS — E1122 Type 2 diabetes mellitus with diabetic chronic kidney disease: Secondary | ICD-10-CM | POA: Diagnosis not present

## 2020-08-17 DIAGNOSIS — N186 End stage renal disease: Secondary | ICD-10-CM | POA: Diagnosis not present

## 2020-08-18 ENCOUNTER — Other Ambulatory Visit: Payer: Self-pay

## 2020-08-18 DIAGNOSIS — Z992 Dependence on renal dialysis: Secondary | ICD-10-CM | POA: Diagnosis not present

## 2020-08-18 DIAGNOSIS — N186 End stage renal disease: Secondary | ICD-10-CM | POA: Diagnosis not present

## 2020-08-18 DIAGNOSIS — N2581 Secondary hyperparathyroidism of renal origin: Secondary | ICD-10-CM | POA: Diagnosis not present

## 2020-08-18 NOTE — Patient Outreach (Signed)
[facility]) Care Management  08/18/2020  [name] [birth year] [id number]     Transition of Care Referral  Referral Date: 08/18/2020 Referral Source: [facility] Discharge Report Date of Discharge: 08/15/2020 Facility: [place]: [facility]    Referral received. Transition of care calls being completed via EMMI-automated calls. RN CM will outreach patient for any red flags received.     Plan: RN CM will close case at this time.   Enzo Montgomery, RN,BSN,CCM [place] Management Telephonic Care Management Coordinator Direct Phone: [phone] Toll Free: [phone] Fax: [phone]

## 2020-08-19 DIAGNOSIS — D631 Anemia in chronic kidney disease: Secondary | ICD-10-CM | POA: Diagnosis not present

## 2020-08-19 DIAGNOSIS — E1122 Type 2 diabetes mellitus with diabetic chronic kidney disease: Secondary | ICD-10-CM | POA: Diagnosis not present

## 2020-08-19 DIAGNOSIS — N186 End stage renal disease: Secondary | ICD-10-CM | POA: Diagnosis not present

## 2020-08-19 DIAGNOSIS — K219 Gastro-esophageal reflux disease without esophagitis: Secondary | ICD-10-CM | POA: Diagnosis not present

## 2020-08-19 DIAGNOSIS — K922 Gastrointestinal hemorrhage, unspecified: Secondary | ICD-10-CM | POA: Diagnosis not present

## 2020-08-19 DIAGNOSIS — I120 Hypertensive chronic kidney disease with stage 5 chronic kidney disease or end stage renal disease: Secondary | ICD-10-CM | POA: Diagnosis not present

## 2020-08-19 DIAGNOSIS — E785 Hyperlipidemia, unspecified: Secondary | ICD-10-CM | POA: Diagnosis not present

## 2020-08-19 DIAGNOSIS — I480 Paroxysmal atrial fibrillation: Secondary | ICD-10-CM | POA: Diagnosis not present

## 2020-08-19 DIAGNOSIS — U071 COVID-19: Secondary | ICD-10-CM | POA: Diagnosis not present

## 2020-08-20 DIAGNOSIS — N186 End stage renal disease: Secondary | ICD-10-CM | POA: Diagnosis not present

## 2020-08-20 DIAGNOSIS — N2581 Secondary hyperparathyroidism of renal origin: Secondary | ICD-10-CM | POA: Diagnosis not present

## 2020-08-20 DIAGNOSIS — Z992 Dependence on renal dialysis: Secondary | ICD-10-CM | POA: Diagnosis not present

## 2020-08-21 DIAGNOSIS — D631 Anemia in chronic kidney disease: Secondary | ICD-10-CM | POA: Diagnosis not present

## 2020-08-21 DIAGNOSIS — N185 Chronic kidney disease, stage 5: Secondary | ICD-10-CM | POA: Diagnosis not present

## 2020-08-21 DIAGNOSIS — K922 Gastrointestinal hemorrhage, unspecified: Secondary | ICD-10-CM | POA: Diagnosis not present

## 2020-08-21 DIAGNOSIS — I6381 Other cerebral infarction due to occlusion or stenosis of small artery: Secondary | ICD-10-CM | POA: Diagnosis not present

## 2020-08-21 DIAGNOSIS — E1122 Type 2 diabetes mellitus with diabetic chronic kidney disease: Secondary | ICD-10-CM | POA: Diagnosis not present

## 2020-08-21 DIAGNOSIS — Z6827 Body mass index (BMI) 27.0-27.9, adult: Secondary | ICD-10-CM | POA: Diagnosis not present

## 2020-08-21 DIAGNOSIS — K279 Peptic ulcer, site unspecified, unspecified as acute or chronic, without hemorrhage or perforation: Secondary | ICD-10-CM | POA: Diagnosis not present

## 2020-08-21 DIAGNOSIS — I480 Paroxysmal atrial fibrillation: Secondary | ICD-10-CM | POA: Diagnosis not present

## 2020-08-21 DIAGNOSIS — E1169 Type 2 diabetes mellitus with other specified complication: Secondary | ICD-10-CM | POA: Diagnosis not present

## 2020-08-21 DIAGNOSIS — R300 Dysuria: Secondary | ICD-10-CM | POA: Diagnosis not present

## 2020-08-21 DIAGNOSIS — U071 COVID-19: Secondary | ICD-10-CM | POA: Diagnosis not present

## 2020-08-21 DIAGNOSIS — I120 Hypertensive chronic kidney disease with stage 5 chronic kidney disease or end stage renal disease: Secondary | ICD-10-CM | POA: Diagnosis not present

## 2020-08-21 DIAGNOSIS — E782 Mixed hyperlipidemia: Secondary | ICD-10-CM | POA: Diagnosis not present

## 2020-08-21 DIAGNOSIS — K219 Gastro-esophageal reflux disease without esophagitis: Secondary | ICD-10-CM | POA: Diagnosis not present

## 2020-08-21 DIAGNOSIS — N186 End stage renal disease: Secondary | ICD-10-CM | POA: Diagnosis not present

## 2020-08-21 DIAGNOSIS — E785 Hyperlipidemia, unspecified: Secondary | ICD-10-CM | POA: Diagnosis not present

## 2020-08-21 DIAGNOSIS — I4891 Unspecified atrial fibrillation: Secondary | ICD-10-CM | POA: Diagnosis not present

## 2020-08-22 DIAGNOSIS — N2581 Secondary hyperparathyroidism of renal origin: Secondary | ICD-10-CM | POA: Diagnosis not present

## 2020-08-22 DIAGNOSIS — Z992 Dependence on renal dialysis: Secondary | ICD-10-CM | POA: Diagnosis not present

## 2020-08-22 DIAGNOSIS — N186 End stage renal disease: Secondary | ICD-10-CM | POA: Diagnosis not present

## 2020-08-25 DIAGNOSIS — N186 End stage renal disease: Secondary | ICD-10-CM | POA: Diagnosis not present

## 2020-08-25 DIAGNOSIS — N2581 Secondary hyperparathyroidism of renal origin: Secondary | ICD-10-CM | POA: Diagnosis not present

## 2020-08-25 DIAGNOSIS — E1122 Type 2 diabetes mellitus with diabetic chronic kidney disease: Secondary | ICD-10-CM | POA: Diagnosis not present

## 2020-08-25 DIAGNOSIS — U071 COVID-19: Secondary | ICD-10-CM | POA: Diagnosis not present

## 2020-08-25 DIAGNOSIS — Z992 Dependence on renal dialysis: Secondary | ICD-10-CM | POA: Diagnosis not present

## 2020-08-26 DIAGNOSIS — I120 Hypertensive chronic kidney disease with stage 5 chronic kidney disease or end stage renal disease: Secondary | ICD-10-CM | POA: Diagnosis not present

## 2020-08-26 DIAGNOSIS — D631 Anemia in chronic kidney disease: Secondary | ICD-10-CM | POA: Diagnosis not present

## 2020-08-26 DIAGNOSIS — E785 Hyperlipidemia, unspecified: Secondary | ICD-10-CM | POA: Diagnosis not present

## 2020-08-26 DIAGNOSIS — I480 Paroxysmal atrial fibrillation: Secondary | ICD-10-CM | POA: Diagnosis not present

## 2020-08-26 DIAGNOSIS — U071 COVID-19: Secondary | ICD-10-CM | POA: Diagnosis not present

## 2020-08-26 DIAGNOSIS — N186 End stage renal disease: Secondary | ICD-10-CM | POA: Diagnosis not present

## 2020-08-26 DIAGNOSIS — E1122 Type 2 diabetes mellitus with diabetic chronic kidney disease: Secondary | ICD-10-CM | POA: Diagnosis not present

## 2020-08-26 DIAGNOSIS — K922 Gastrointestinal hemorrhage, unspecified: Secondary | ICD-10-CM | POA: Diagnosis not present

## 2020-08-26 DIAGNOSIS — K219 Gastro-esophageal reflux disease without esophagitis: Secondary | ICD-10-CM | POA: Diagnosis not present

## 2020-08-27 DIAGNOSIS — N186 End stage renal disease: Secondary | ICD-10-CM | POA: Diagnosis not present

## 2020-08-27 DIAGNOSIS — N2581 Secondary hyperparathyroidism of renal origin: Secondary | ICD-10-CM | POA: Diagnosis not present

## 2020-08-27 DIAGNOSIS — Z992 Dependence on renal dialysis: Secondary | ICD-10-CM | POA: Diagnosis not present

## 2020-08-28 DIAGNOSIS — D631 Anemia in chronic kidney disease: Secondary | ICD-10-CM | POA: Diagnosis not present

## 2020-08-28 DIAGNOSIS — E785 Hyperlipidemia, unspecified: Secondary | ICD-10-CM | POA: Diagnosis not present

## 2020-08-28 DIAGNOSIS — I480 Paroxysmal atrial fibrillation: Secondary | ICD-10-CM | POA: Diagnosis not present

## 2020-08-28 DIAGNOSIS — U071 COVID-19: Secondary | ICD-10-CM | POA: Diagnosis not present

## 2020-08-28 DIAGNOSIS — K219 Gastro-esophageal reflux disease without esophagitis: Secondary | ICD-10-CM | POA: Diagnosis not present

## 2020-08-28 DIAGNOSIS — E1122 Type 2 diabetes mellitus with diabetic chronic kidney disease: Secondary | ICD-10-CM | POA: Diagnosis not present

## 2020-08-28 DIAGNOSIS — I120 Hypertensive chronic kidney disease with stage 5 chronic kidney disease or end stage renal disease: Secondary | ICD-10-CM | POA: Diagnosis not present

## 2020-08-28 DIAGNOSIS — N186 End stage renal disease: Secondary | ICD-10-CM | POA: Diagnosis not present

## 2020-08-28 DIAGNOSIS — K922 Gastrointestinal hemorrhage, unspecified: Secondary | ICD-10-CM | POA: Diagnosis not present

## 2020-08-29 DIAGNOSIS — Z992 Dependence on renal dialysis: Secondary | ICD-10-CM | POA: Diagnosis not present

## 2020-08-29 DIAGNOSIS — N2581 Secondary hyperparathyroidism of renal origin: Secondary | ICD-10-CM | POA: Diagnosis not present

## 2020-08-29 DIAGNOSIS — N186 End stage renal disease: Secondary | ICD-10-CM | POA: Diagnosis not present

## 2020-09-01 DIAGNOSIS — N2581 Secondary hyperparathyroidism of renal origin: Secondary | ICD-10-CM | POA: Diagnosis not present

## 2020-09-01 DIAGNOSIS — N186 End stage renal disease: Secondary | ICD-10-CM | POA: Diagnosis not present

## 2020-09-01 DIAGNOSIS — Z992 Dependence on renal dialysis: Secondary | ICD-10-CM | POA: Diagnosis not present

## 2020-09-02 DIAGNOSIS — K5900 Constipation, unspecified: Secondary | ICD-10-CM | POA: Diagnosis not present

## 2020-09-02 DIAGNOSIS — D631 Anemia in chronic kidney disease: Secondary | ICD-10-CM | POA: Diagnosis not present

## 2020-09-02 DIAGNOSIS — U071 COVID-19: Secondary | ICD-10-CM | POA: Diagnosis not present

## 2020-09-02 DIAGNOSIS — D649 Anemia, unspecified: Secondary | ICD-10-CM | POA: Diagnosis not present

## 2020-09-02 DIAGNOSIS — I120 Hypertensive chronic kidney disease with stage 5 chronic kidney disease or end stage renal disease: Secondary | ICD-10-CM | POA: Diagnosis not present

## 2020-09-02 DIAGNOSIS — E1122 Type 2 diabetes mellitus with diabetic chronic kidney disease: Secondary | ICD-10-CM | POA: Diagnosis not present

## 2020-09-02 DIAGNOSIS — K219 Gastro-esophageal reflux disease without esophagitis: Secondary | ICD-10-CM | POA: Diagnosis not present

## 2020-09-02 DIAGNOSIS — I480 Paroxysmal atrial fibrillation: Secondary | ICD-10-CM | POA: Diagnosis not present

## 2020-09-02 DIAGNOSIS — E785 Hyperlipidemia, unspecified: Secondary | ICD-10-CM | POA: Diagnosis not present

## 2020-09-02 DIAGNOSIS — R195 Other fecal abnormalities: Secondary | ICD-10-CM | POA: Diagnosis not present

## 2020-09-02 DIAGNOSIS — Z1212 Encounter for screening for malignant neoplasm of rectum: Secondary | ICD-10-CM | POA: Diagnosis not present

## 2020-09-02 DIAGNOSIS — K922 Gastrointestinal hemorrhage, unspecified: Secondary | ICD-10-CM | POA: Diagnosis not present

## 2020-09-02 DIAGNOSIS — Z7901 Long term (current) use of anticoagulants: Secondary | ICD-10-CM | POA: Diagnosis not present

## 2020-09-02 DIAGNOSIS — N186 End stage renal disease: Secondary | ICD-10-CM | POA: Diagnosis not present

## 2020-09-03 ENCOUNTER — Telehealth: Payer: Self-pay | Admitting: Cardiovascular Disease

## 2020-09-03 DIAGNOSIS — Z992 Dependence on renal dialysis: Secondary | ICD-10-CM | POA: Diagnosis not present

## 2020-09-03 DIAGNOSIS — N186 End stage renal disease: Secondary | ICD-10-CM | POA: Diagnosis not present

## 2020-09-03 DIAGNOSIS — N2581 Secondary hyperparathyroidism of renal origin: Secondary | ICD-10-CM | POA: Diagnosis not present

## 2020-09-03 NOTE — Telephone Encounter (Signed)
Patient daughter Amy called and stated that she is very concerned about her mom's medication. She said that the patient's PCP changed the medication that Dr. Gwenlyn Found prescribed to her. He increased her Eliquis from 2.5 mg to 5 mg 2 times daily. Wants to know if Dr. Gwenlyn Found thinks this is the right move, and if not change it back if possible. Please call back.

## 2020-09-03 NOTE — Telephone Encounter (Signed)
Returned call to patient's daughter who states that patients Eliquis was recently increased to 5mg  BID from 2.5mg  BID by patient's PCP. Patients daughter states that she contacted PCP but was unable to determine why the medication was increased. Patient's daughter would like to make sure this is safe for patient from Cardiology standpoint. Advised patient's daughter I would forward this message to PharmD and Dr. Gwenlyn Found. Patient verbalized understanding.

## 2020-09-03 NOTE — Telephone Encounter (Signed)
Pt technically does qualify for Eliquis 5mg  BID based on normal dosing criteria (age, weight, and SCr) until next January when she'll turn 81 and then would require dose decrease back to 2.5mg  BID.  However, pt has complicated history of ESRD and afib and was admitted 07/12/20 with ischemic stroke with hemorrhagic conversion, complicated by GI bleed and COVID infection.  In setting of pt's comorbidities, will defer to MD for input regarding Eliquis dosing.

## 2020-09-04 DIAGNOSIS — D631 Anemia in chronic kidney disease: Secondary | ICD-10-CM | POA: Diagnosis not present

## 2020-09-04 DIAGNOSIS — K219 Gastro-esophageal reflux disease without esophagitis: Secondary | ICD-10-CM | POA: Diagnosis not present

## 2020-09-04 DIAGNOSIS — K922 Gastrointestinal hemorrhage, unspecified: Secondary | ICD-10-CM | POA: Diagnosis not present

## 2020-09-04 DIAGNOSIS — I120 Hypertensive chronic kidney disease with stage 5 chronic kidney disease or end stage renal disease: Secondary | ICD-10-CM | POA: Diagnosis not present

## 2020-09-04 DIAGNOSIS — E785 Hyperlipidemia, unspecified: Secondary | ICD-10-CM | POA: Diagnosis not present

## 2020-09-04 DIAGNOSIS — N186 End stage renal disease: Secondary | ICD-10-CM | POA: Diagnosis not present

## 2020-09-04 DIAGNOSIS — I480 Paroxysmal atrial fibrillation: Secondary | ICD-10-CM | POA: Diagnosis not present

## 2020-09-04 DIAGNOSIS — U071 COVID-19: Secondary | ICD-10-CM | POA: Diagnosis not present

## 2020-09-04 DIAGNOSIS — E1122 Type 2 diabetes mellitus with diabetic chronic kidney disease: Secondary | ICD-10-CM | POA: Diagnosis not present

## 2020-09-04 MED ORDER — APIXABAN 2.5 MG PO TABS: 2.5000 mg | ORAL_TABLET | Freq: Two times a day (BID) | ORAL | 1 refills | Status: DC

## 2020-09-04 NOTE — Telephone Encounter (Signed)
Eliquis 2.5mg  BID refill has been sent to [facility].

## 2020-09-04 NOTE — Telephone Encounter (Signed)
Daughter updated and would like a new refill for 2.5 mg sent to [facility]. Will route to Pharm D

## 2020-09-04 NOTE — Telephone Encounter (Signed)
2.5 mg PO BID sounds prudent in light of her co morbidities, age etc

## 2020-09-05 DIAGNOSIS — N186 End stage renal disease: Secondary | ICD-10-CM | POA: Diagnosis not present

## 2020-09-05 DIAGNOSIS — R195 Other fecal abnormalities: Secondary | ICD-10-CM | POA: Diagnosis not present

## 2020-09-05 DIAGNOSIS — Z992 Dependence on renal dialysis: Secondary | ICD-10-CM | POA: Diagnosis not present

## 2020-09-05 DIAGNOSIS — N2581 Secondary hyperparathyroidism of renal origin: Secondary | ICD-10-CM | POA: Diagnosis not present

## 2020-09-08 DIAGNOSIS — Z992 Dependence on renal dialysis: Secondary | ICD-10-CM | POA: Diagnosis not present

## 2020-09-08 DIAGNOSIS — N186 End stage renal disease: Secondary | ICD-10-CM | POA: Diagnosis not present

## 2020-09-08 DIAGNOSIS — N2581 Secondary hyperparathyroidism of renal origin: Secondary | ICD-10-CM | POA: Diagnosis not present

## 2020-09-09 ENCOUNTER — Ambulatory Visit: Payer: Medicare HMO | Admitting: Adult Health

## 2020-09-09 ENCOUNTER — Encounter: Payer: Self-pay | Admitting: Adult Health

## 2020-09-09 VITALS — BP 153/77 | HR 91 | Ht 64.0 in | Wt 165.0 lb

## 2020-09-09 DIAGNOSIS — U071 COVID-19: Secondary | ICD-10-CM | POA: Diagnosis not present

## 2020-09-09 DIAGNOSIS — N186 End stage renal disease: Secondary | ICD-10-CM | POA: Diagnosis not present

## 2020-09-09 DIAGNOSIS — E1122 Type 2 diabetes mellitus with diabetic chronic kidney disease: Secondary | ICD-10-CM | POA: Diagnosis not present

## 2020-09-09 DIAGNOSIS — I619 Nontraumatic intracerebral hemorrhage, unspecified: Secondary | ICD-10-CM

## 2020-09-09 DIAGNOSIS — K219 Gastro-esophageal reflux disease without esophagitis: Secondary | ICD-10-CM | POA: Diagnosis not present

## 2020-09-09 DIAGNOSIS — I480 Paroxysmal atrial fibrillation: Secondary | ICD-10-CM | POA: Diagnosis not present

## 2020-09-09 DIAGNOSIS — K922 Gastrointestinal hemorrhage, unspecified: Secondary | ICD-10-CM | POA: Diagnosis not present

## 2020-09-09 DIAGNOSIS — E785 Hyperlipidemia, unspecified: Secondary | ICD-10-CM | POA: Diagnosis not present

## 2020-09-09 DIAGNOSIS — D631 Anemia in chronic kidney disease: Secondary | ICD-10-CM | POA: Diagnosis not present

## 2020-09-09 DIAGNOSIS — I120 Hypertensive chronic kidney disease with stage 5 chronic kidney disease or end stage renal disease: Secondary | ICD-10-CM | POA: Diagnosis not present

## 2020-09-09 NOTE — Progress Notes (Signed)
I agree with the above plan 

## 2020-09-09 NOTE — Patient Instructions (Addendum)
Continue working with [facility] PT for likely ongoing improvement  Continue Eliquis (apixaban) daily  and atorvastatin for secondary stroke prevention  Continue to follow with cardiology for atrial fibrillation and Eliquis management  Continue to follow up with PCP regarding cholesterol, blood pressure and diabetes management  Maintain strict control of hypertension with blood pressure goal below 130/90, diabetes with hemoglobin A1c goal below 7.0% and cholesterol with LDL cholesterol (bad cholesterol) goal below 70 mg/dL.       Followup in the future with me in 3 months or call earlier if needed       Thank you for coming to see [place] at [facility]. I hope we have been able to provide you high quality care today.  You may receive a patient satisfaction survey over the next few weeks. We would appreciate your feedback and comments so that we may continue to improve ourselves and the health of our patients.      Stroke Prevention Some medical conditions and lifestyle choices can lead to a higher risk for a stroke. You can help to prevent a stroke by eating healthy foods and exercising. It also helps to not smoke and to manage any health problems you may have. How can this condition affect me? A stroke is an emergency. It should be treated right away. A stroke can lead to brain damage or threaten your life. There is a better chance of surviving and getting better after a stroke if you get medical help right away. What can increase my risk? The following medical conditions may increase your risk of a stroke:  Diseases of the heart and blood vessels (cardiovascular disease).  High blood pressure (hypertension).  Diabetes.  High cholesterol.  Sickle cell disease.  Problems with blood clotting.  Being very overweight.  Sleeping problems (obstructivesleep apnea). Other risk factors include:  Being older than age 53.  A history of blood clots, stroke, or  mini-stroke (TIA).  Race, ethnic background, or a family history of stroke.  Smoking or using tobacco products.  Taking birth control pills, especially if you smoke.  Heavy alcohol and drug use.  Not being active. What actions can I take to prevent this? Manage your health conditions  High cholesterol. ? Eat a healthy diet. If this is not enough to manage your cholesterol, you may need to take medicines. ? Take medicines as told by your doctor.  High blood pressure. ? Try to keep your blood pressure below 130/80. ? If your blood pressure cannot be managed through a healthy diet and regular exercise, you may need to take medicines. ? Take medicines as told by your doctor. ? Ask your doctor if you should check your blood pressure at home. ? Have your blood pressure checked every year.  Diabetes. ? Eat a healthy diet and get regular exercise. If your blood sugar (glucose) cannot be managed through diet and exercise, you may need to take medicines. ? Take medicines as told by your doctor.  Talk to your doctor about getting checked for sleeping problems. Signs of a problem can include: ? Snoring a lot. ? Feeling very tired.  Make sure that you manage any other conditions you have. Nutrition  Follow instructions from your doctor about what to eat or drink. You may be told to: ? Eat and drink fewer calories each day. ? Limit how much salt (sodium) you use to 1,500 milligrams (mg) each day. ? Use only healthy fats for cooking, such as olive oil, canola  oil, and sunflower oil. ? Eat healthy foods. To do this:  Choose foods that are high in fiber. These include whole grains, and fresh fruits and vegetables.  Eat at least 5 servings of fruits and vegetables a day. Try to fill one-half of your plate with fruits and vegetables at each meal.  Choose low-fat (lean) proteins. These include low-fat cuts of meat, chicken without skin, fish, tofu, beans, and nuts.  Eat low-fat dairy  products. ? Avoid foods that:  Are high in salt.  Have saturated fat.  Have trans fat.  Have cholesterol.  Are processed or pre-made. ? Count how many carbohydrates you eat and drink each day.   Lifestyle  If you drink alcohol: ? Limit how much you have to:  0-1 drink a day for women who are not pregnant.  0-2 drinks a day for men. ? Know how much alcohol is in your drink. In the [place] one drink equals one 12 oz bottle of beer (366mL), one 5 oz glass of wine (16mL), or one 1 oz glass of hard liquor (7mL).  Do not smoke or use any products that have nicotine or tobacco. If you need help quitting, ask your doctor.  Avoid secondhand smoke.  Do not use drugs. Activity  Try to stay at a healthy weight.  Get at least 30 minutes of exercise on most days, such as: ? Fast walking. ? Biking. ? Swimming.   Medicines  Take over-the-counter and prescription medicines only as told by your doctor.  Avoid taking birth control pills. Talk to your doctor about the risks of taking birth control pills if: ? You are over 75 years old. ? You smoke. ? You get very bad headaches. ? You have had a blood clot. Where to find more information  American Stroke Association: www.strokeassociation.org Get help right away if:  You or a loved one has any signs of a stroke. "BE FAST" is an easy way to remember the warning signs: ? B - Balance. Dizziness, sudden trouble walking, or loss of balance. ? E - Eyes. Trouble seeing or a change in how you see. ? F - Face. Sudden weakness or loss of feeling of the face. The face or eyelid may droop on one side. ? A - Arms. Weakness or loss of feeling in an arm. This happens all of a sudden and most often on one side of the body. ? S - Speech. Sudden trouble speaking, slurred speech, or trouble understanding what people say. ? T - Time. Time to call emergency services. Write down what time symptoms started.  You or a loved one has other signs of a  stroke, such as: ? A sudden, very bad headache with no known cause. ? Feeling like you may vomit (nausea). ? Vomiting. ? A seizure. These symptoms may be an emergency. Get help right away. Call your local emergency services (911 in the [place]  Do not wait to see if the symptoms will go away.  Do not drive yourself to the hospital. Summary  You can help to prevent a stroke by eating healthy, exercising, and not smoking. It also helps to manage any health problems you have.  Do not smoke or use any products that contain nicotine or tobacco.  Get help right away if you or a loved one has any signs of a stroke. This information is not intended to replace advice given to you by your health care provider. Make sure you discuss any questions you have  with your health care provider. Document Revised: 02/11/2020 Document Reviewed: 02/11/2020 Elsevier Patient Education  [place].  Sleep Apnea Sleep apnea affects breathing during sleep. It causes breathing to stop for a short time or to become shallow. It can also increase the risk of:  Heart attack.  Stroke.  Being very overweight (obese).  Diabetes.  Heart failure.  Irregular heartbeat. The goal of treatment is to help you breathe normally again. What are the causes? There are three kinds of sleep apnea:  Obstructive sleep apnea. This is caused by a blocked or collapsed airway.  Central sleep apnea. This happens when the brain does not send the right signals to the muscles that control breathing.  Mixed sleep apnea. This is a combination of obstructive and central sleep apnea. The most common cause of this condition is a collapsed or blocked airway. This can happen if:  Your throat muscles are too relaxed.  Your tongue and tonsils are too large.  You are overweight.  Your airway is too small.   What increases the risk?  Being overweight.  Smoking.  Having a small airway.  Being older.  Being  female.  Drinking alcohol.  Taking medicines to calm yourself (sedatives or tranquilizers).  Having family members with the condition. What are the signs or symptoms?  Trouble staying asleep.  Being sleepy or tired during the day.  Getting angry a lot.  Loud snoring.  Headaches in the morning.  Not being able to focus your mind (concentrate).  Forgetting things.  Less interest in sex.  Mood swings.  Personality changes.  Feelings of sadness (depression).  Waking up a lot during the night to pee (urinate).  Dry mouth.  Sore throat. How is this diagnosed?  Your medical history.  A physical exam.  A test that is done when you are sleeping (sleep study). The test is most often done in a sleep lab but may also be done at home. How is this treated?  Sleeping on your side.  Using a medicine to get rid of mucus in your nose (decongestant).  Avoiding the use of alcohol, medicines to help you relax, or certain pain medicines (narcotics).  Losing weight, if needed.  Changing your diet.  Not smoking.  Using a machine to open your airway while you sleep, such as: ? An oral appliance. This is a mouthpiece that shifts your lower jaw forward. ? A CPAP device. This device blows air through a mask when you breathe out (exhale). ? An EPAP device. This has valves that you put in each nostril. ? A BPAP device. This device blows air through a mask when you breathe in (inhale) and breathe out.  Having surgery if other treatments do not work. It is important to get treatment for sleep apnea. Without treatment, it can lead to:  High blood pressure.  Coronary artery disease.  In men, not being able to have an erection (impotence).  Reduced thinking ability.   Follow these instructions at home: Lifestyle  Make changes that your doctor recommends.  Eat a healthy diet.  Lose weight if needed.  Avoid alcohol, medicines to help you relax, and some pain  medicines.  Do not use any products that contain nicotine or tobacco, such as cigarettes, e-cigarettes, and chewing tobacco. If you need help quitting, ask your doctor. General instructions  Take over-the-counter and prescription medicines only as told by your doctor.  If you were given a machine to use while you sleep, use it  only as told by your doctor.  If you are having surgery, make sure to tell your doctor you have sleep apnea. You may need to bring your device with you.  Keep all follow-up visits as told by your doctor. This is important. Contact a doctor if:  The machine that you were given to use during sleep bothers you or does not seem to be working.  You do not get better.  You get worse. Get help right away if:  Your chest hurts.  You have trouble breathing in enough air.  You have an uncomfortable feeling in your back, arms, or stomach.  You have trouble talking.  One side of your body feels weak.  A part of your face is hanging down. These symptoms may be an emergency. Do not wait to see if the symptoms will go away. Get medical help right away. Call your local emergency services (911 in the [place] Do not drive yourself to the hospital. Summary  This condition affects breathing during sleep.  The most common cause is a collapsed or blocked airway.  The goal of treatment is to help you breathe normally while you sleep. This information is not intended to replace advice given to you by your health care provider. Make sure you discuss any questions you have with your health care provider. Document Revised: 04/28/2018 Document Reviewed: 03/07/2018 Elsevier Patient Education  [place].

## 2020-09-09 NOTE — Progress Notes (Signed)
[facility] [address] [place]. [place] [address] [phone]       HOSPITAL FOLLOW UP NOTE  [name] Date of Birth:  [birth year] Medical Record Number:  [id number]   Reason for Referral:  hospital stroke follow up    SUBJECTIVE:   CHIEF COMPLAINT:  Chief Complaint  Patient presents with  . Follow-up    TR daughter (amy) PT is well, well no symptoms     HPI:   [name] is a 80 y.o. female with history of breast cancer, DM, HLD, Covid 07/07/20, ESRD on HD (Dialysis with antibody infusion 12/17), with recent back and leg pain. On 07/12/2020 at ~0400 EMS called [facility] for GI bleed (2 days of black tarry stools and emesis)was neurologically intact and she refused transport. EMS was calledagain where they noticed a left sided facial droop and leftleg weakness with associated decreased sensation andnew onset atrial fib therefore transported to [facility]. Personally reviewed hospitalization pertinent progress notes, lab work and imaging with summary provided.  Evaluated by Dr. Leonie Man with stroke work-up revealing acute 18mm hemorrhage within the right basal ganglia, likely hemorrhagic infarct in setting of atrial fibrillation.  Recommend initiating aspirin 81 mg daily and apixaban initiated prior to discharge after cleared by GI.  History of HTN on amlodipine, hydralazine and metoprolol PTA mildly high during admission.  History of HLD on atorvastatin 20 mg daily with LDL 32.  History of DM with A1c 7.9.  Other stroke risk factors include new onset A. fib, prior strokes on imaging, and advanced age.  Other active problems during admission include GI bleed and recent Covid diagnosis 12/13.  Evaluated by therapies who recommended discharge to SNF for ongoing therapy needs.   ICH: Acute 7 mm hemorrhage within the right basal ganglia, likely hemorrhagic infarct in the setting of atrial fibrillation   Code Stroke CT Head - Acute 7 mm hemorrhage within the  right basal ganglia. No substantial mass effect. Chronic microvascular ischemic disease and remote right cerebellar lacunar infarct.   MRI head -unchanged 1 cm hemorrhage centered at the posterior right lentiform nucleus/right internal capsule. 73mm focus of diffusion abnormality involving the subcortical white matter of the right parietal corona radiata consistent with a small acute to subacute small vessel type ischemic infarct.   MRA head - no LVO, focal severe high-grade stenosis involving the mid distal right M1 segment and distal left M1 segment/left MCA bifurcation.  Severe multifocal bilateral P2 and distal left P3 stenosis  Carotid Doppler -bilateral 1-39% carotid stenosis.    2D Echo -normal ejection fraction.  No cardiac source of embolism.  [employer] Virus 2 - not ordered - recently diagnosed as positive 07/07/20  LDL -32 mg percent   HgbA1c - 7.9  No antithrombotic prior to admission, placed on aspirin 81 mg daily and eventually transition to apixaban for secondary stroke prevention in setting of recent stroke  Ongoing aggressive stroke risk factor management  Therapy recommendations:  SNF  Disposition:  SNF [facility]  Today, 09/09/2020, [name] is being seen for hospital follow-up accompanied by her daughter.  She has been doing well since discharge reports residual mild left-sided weakness but overall greatly improving.  She has since returned back home with her husband and currently working with [facility] PT. her daughter has been staying with her since discharge.  Denies new stroke/TIA symptoms.  Reports compliance on Eliquis 2.5 mg twice daily and atorvastatin 20 mg daily without side effects. Blood pressure  today 153/27 typically ranging 140-150/80s at home.  Reports recent adjustment to DM regimen by PCP.  Routinely follows with GI with recent visit last week (unable to view via epic).  No further concerns at this time.    ROS:   14 system review of systems  performed and negative with exception of those listed in HPI  PMH:  Past Medical History:  Diagnosis Date  . Cancer ([place])   . Diabetes mellitus   . Hyperlipidemia   . Hypertension    pt states at times    PSH:  Past Surgical History:  Procedure Laterality Date  . BIOPSY  07/17/2020   Procedure: BIOPSY;  Surgeon: Yetta Flock, MD;  Location: [facility] ENDOSCOPY;  Service: Gastroenterology;;  . BREAST LUMPECTOMY  03/26/2011   right  . ESOPHAGOGASTRODUODENOSCOPY (EGD) WITH PROPOFOL N/A 07/17/2020   Procedure: ESOPHAGOGASTRODUODENOSCOPY (EGD) WITH PROPOFOL;  Surgeon: Yetta Flock, MD;  Location: [facility];  Service: Gastroenterology;  Laterality: N/A;    Social History:  Social History   Socioeconomic History  . Marital status: Married    Spouse name: Not on file  . Number of children: Not on file  . Years of education: Not on file  . Highest education level: Not on file  Occupational History  . Not on file  Tobacco Use  . Smoking status: Never Smoker  . Smokeless tobacco: Never Used  Vaping Use  . Vaping Use: Never used  Substance and Sexual Activity  . Alcohol use: No  . Drug use: No  . Sexual activity: Not Currently  Other Topics Concern  . Not on file  Social History Narrative  . Not on file   Social Determinants of Health   Financial Resource Strain: Not on file  Food Insecurity: Not on file  Transportation Needs: Not on file  Physical Activity: Not on file  Stress: Not on file  Social Connections: Not on file  Intimate Partner Violence: Not on file    Family History:  Family History  Problem Relation Age of Onset  . Cancer Father        bronchial  . Cancer Paternal Aunt        breast  . Cancer Paternal Aunt        breast    Medications:   Current Outpatient Medications on File Prior to Visit  Medication Sig Dispense Refill  . amiodarone (PACERONE) 200 MG tablet Take 1 tablet (200 mg total) by mouth daily. 30 tablet 0  . apixaban  (ELIQUIS) 2.5 MG TABS tablet Take 1 tablet (2.5 mg total) by mouth 2 (two) times daily. 180 tablet 1  . ascorbic acid (VITAMIN C) 500 MG tablet Take 500 mg by mouth 2 (two) times daily.    Marland Kitchen atorvastatin (LIPITOR) 20 MG tablet Take 20 mg by mouth at bedtime.    . Calcium Carbonate (CALTRATE 600 PO) Take 2 tablets by mouth daily.    . Cholecalciferol (VITAMIN D) 2000 UNITS tablet Take 2,000 Units by mouth daily.    Marland Kitchen diltiazem (CARDIZEM CD) 120 MG 24 hr capsule Take 1 capsule (120 mg total) by mouth daily. 30 capsule 0  . glipiZIDE (GLUCOTROL XL) 2.5 MG 24 hr tablet Take 2.5 mg by mouth daily.    . hydrALAZINE (APRESOLINE) 25 MG tablet Take 1 tablet (25 mg total) by mouth 2 (two) times daily.    . Insulin Degludec-Liraglutide (XULTOPHY) 100-3.6 UNIT-MG/ML SOPN Inject 44 Units into the skin every morning.    Marland Kitchen letrozole (  FEMARA) 2.5 MG tablet Take 1 tablet (2.5 mg total) by mouth daily. (Patient taking differently: Take 2.5 mg by mouth at bedtime.) 90 tablet 3  . pantoprazole (PROTONIX) 40 MG tablet Take 1 tablet (40 mg total) by mouth 2 (two) times daily. 60 tablet 0  . sevelamer carbonate (RENVELA) 800 MG tablet Take 800 mg by mouth 3 (three) times daily with meals.    . zinc sulfate 220 (50 Zn) MG capsule Take 220 mg by mouth 2 (two) times daily.     No current facility-administered medications on file prior to visit.    Allergies:  No Known Allergies    OBJECTIVE:  Physical Exam  Vitals:   09/09/20 1303  BP: (!) 153/77  Pulse: 91  Weight: 165 lb (74.8 kg)  Height: 5\' 4"  (1.626 m)   Body mass index is 28.32 kg/m. No exam data present  General: well developed, well nourished, very pleasant elderly Caucasian female, seated, in no evident distress Head: head normocephalic and atraumatic.   Neck: supple with no carotid or supraclavicular bruits Cardiovascular: regular rate and rhythm, no murmurs Musculoskeletal: no deformity Skin:  no rash/petichiae Vascular:  Normal pulses all  extremities   Neurologic Exam Mental Status: Awake and fully alert.  Fluent speech and language.  Oriented to place and time. Recent and remote memory intact. Attention span, concentration and fund of knowledge appropriate. Mood and affect appropriate.  Cranial Nerves: Fundoscopic exam reveals sharp disc margins. Pupils equal, briskly reactive to light. Extraocular movements full without nystagmus. Visual fields full to confrontation. Hearing intact. Facial sensation intact. Face, tongue, palate moves normally and symmetrically.  Motor: Normal bulk and tone. Normal strength in all tested extremity muscles except slightly decreased left hip flexor and ankle dorsiflexion weakness Sensory.: intact to touch , pinprick , position and vibratory sensation.  Coordination: Rapid alternating movements normal in all extremities. Finger-to-nose and heel-to-shin performed accurately bilaterally. Gait and Station: Arises from chair without difficulty. Stance is slightly hunched. Gait demonstrates  slightly decreased stride length and step height LLE with mild unsteadiness and use of rolling walker Reflexes: 1+ and symmetric. Toes downgoing.     NIHSS  0 Modified Rankin  2-3      ASSESSMENT: [name] is a 80 y.o. year old female presented with left-sided facial droop and left leg weakness on 07/12/2020 with stroke work-up revealing acute 36mm hemorrhage within the right basal ganglia, likely hemorrhagic infarct secondary to new onset atrial fibrillation. Vascular risk factors include new onset A. fib with recent Covid infection, HTN, HLD, DM and ESRD on HD.      PLAN:  1. Hemorrhagic transformation, R BG:  a. Residual deficit: mild LLE weakness -continue working with [facility] PT for likely ongoing recovery.  b. Continue Eliquis (apixaban) daily  and atorvastatin for secondary stroke prevention.   c. Discussed secondary stroke prevention measures and importance of close PCP follow up for aggressive  stroke risk factor management  2. Atrial fibrillation, new onset: CHA2DS2-VASc score of at least 7 on Eliquis 2.5 mg twice daily (lower dose recommended by cardiology d/t kidney function, recent GI bleed and ESRD) 3. HTN: BP goal <130/90.  Slightly elevated today as well as per home readings -advised continued follow-up with PCP or cardiology for ongoing monitoring and management 4. HLD: LDL goal <70.  Well-controlled on atorvastatin 20 mg daily 5. DMII: A1c goal<7.0. Recent A1c 7.9 monitored by PCP.     Follow up in 3 months or call earlier if needed  CC:  [place] provider: Dr. Sigmund Hazel, Elyse Jarvis, MD     I spent 45 minutes of face-to-face and non-face-to-face time with patient and daughter.  This included previsit chart review including recent hospitalization pertinent progress notes, lab work and imaging, lab review, study review, order entry, electronic health record documentation, patient education regarding recent stroke including etiology, residual deficits, new onset A. fib and use of AC, importance of managing stroke risk factors and answered all other questions to patient satisfaction   Frann Rider, AGNP-BC  [facility] [address] [place], [place] [address]  Phone [phone] Fax [phone] Note: This document was prepared with digital dictation and possible smart phrase technology. Any transcriptional errors that result from this process are unintentional.

## 2020-09-10 DIAGNOSIS — N2581 Secondary hyperparathyroidism of renal origin: Secondary | ICD-10-CM | POA: Diagnosis not present

## 2020-09-10 DIAGNOSIS — N186 End stage renal disease: Secondary | ICD-10-CM | POA: Diagnosis not present

## 2020-09-10 DIAGNOSIS — Z992 Dependence on renal dialysis: Secondary | ICD-10-CM | POA: Diagnosis not present

## 2020-09-11 DIAGNOSIS — E1122 Type 2 diabetes mellitus with diabetic chronic kidney disease: Secondary | ICD-10-CM | POA: Diagnosis not present

## 2020-09-11 DIAGNOSIS — N186 End stage renal disease: Secondary | ICD-10-CM | POA: Diagnosis not present

## 2020-09-11 DIAGNOSIS — K219 Gastro-esophageal reflux disease without esophagitis: Secondary | ICD-10-CM | POA: Diagnosis not present

## 2020-09-11 DIAGNOSIS — U071 COVID-19: Secondary | ICD-10-CM | POA: Diagnosis not present

## 2020-09-11 DIAGNOSIS — I480 Paroxysmal atrial fibrillation: Secondary | ICD-10-CM | POA: Diagnosis not present

## 2020-09-11 DIAGNOSIS — K922 Gastrointestinal hemorrhage, unspecified: Secondary | ICD-10-CM | POA: Diagnosis not present

## 2020-09-11 DIAGNOSIS — I120 Hypertensive chronic kidney disease with stage 5 chronic kidney disease or end stage renal disease: Secondary | ICD-10-CM | POA: Diagnosis not present

## 2020-09-11 DIAGNOSIS — E785 Hyperlipidemia, unspecified: Secondary | ICD-10-CM | POA: Diagnosis not present

## 2020-09-11 DIAGNOSIS — D631 Anemia in chronic kidney disease: Secondary | ICD-10-CM | POA: Diagnosis not present

## 2020-09-12 DIAGNOSIS — N2581 Secondary hyperparathyroidism of renal origin: Secondary | ICD-10-CM | POA: Diagnosis not present

## 2020-09-12 DIAGNOSIS — N186 End stage renal disease: Secondary | ICD-10-CM | POA: Diagnosis not present

## 2020-09-12 DIAGNOSIS — Z992 Dependence on renal dialysis: Secondary | ICD-10-CM | POA: Diagnosis not present

## 2020-09-15 DIAGNOSIS — N2581 Secondary hyperparathyroidism of renal origin: Secondary | ICD-10-CM | POA: Diagnosis not present

## 2020-09-15 DIAGNOSIS — R300 Dysuria: Secondary | ICD-10-CM | POA: Diagnosis not present

## 2020-09-15 DIAGNOSIS — N186 End stage renal disease: Secondary | ICD-10-CM | POA: Diagnosis not present

## 2020-09-15 DIAGNOSIS — Z992 Dependence on renal dialysis: Secondary | ICD-10-CM | POA: Diagnosis not present

## 2020-09-15 DIAGNOSIS — N390 Urinary tract infection, site not specified: Secondary | ICD-10-CM | POA: Diagnosis not present

## 2020-09-16 DIAGNOSIS — N186 End stage renal disease: Secondary | ICD-10-CM | POA: Diagnosis not present

## 2020-09-16 DIAGNOSIS — U071 COVID-19: Secondary | ICD-10-CM | POA: Diagnosis not present

## 2020-09-16 DIAGNOSIS — I480 Paroxysmal atrial fibrillation: Secondary | ICD-10-CM | POA: Diagnosis not present

## 2020-09-16 DIAGNOSIS — I120 Hypertensive chronic kidney disease with stage 5 chronic kidney disease or end stage renal disease: Secondary | ICD-10-CM | POA: Diagnosis not present

## 2020-09-16 DIAGNOSIS — K922 Gastrointestinal hemorrhage, unspecified: Secondary | ICD-10-CM | POA: Diagnosis not present

## 2020-09-16 DIAGNOSIS — D631 Anemia in chronic kidney disease: Secondary | ICD-10-CM | POA: Diagnosis not present

## 2020-09-16 DIAGNOSIS — K219 Gastro-esophageal reflux disease without esophagitis: Secondary | ICD-10-CM | POA: Diagnosis not present

## 2020-09-16 DIAGNOSIS — E1122 Type 2 diabetes mellitus with diabetic chronic kidney disease: Secondary | ICD-10-CM | POA: Diagnosis not present

## 2020-09-16 DIAGNOSIS — E785 Hyperlipidemia, unspecified: Secondary | ICD-10-CM | POA: Diagnosis not present

## 2020-09-17 DIAGNOSIS — N2581 Secondary hyperparathyroidism of renal origin: Secondary | ICD-10-CM | POA: Diagnosis not present

## 2020-09-17 DIAGNOSIS — N186 End stage renal disease: Secondary | ICD-10-CM | POA: Diagnosis not present

## 2020-09-17 DIAGNOSIS — Z992 Dependence on renal dialysis: Secondary | ICD-10-CM | POA: Diagnosis not present

## 2020-09-18 DIAGNOSIS — I120 Hypertensive chronic kidney disease with stage 5 chronic kidney disease or end stage renal disease: Secondary | ICD-10-CM | POA: Diagnosis not present

## 2020-09-18 DIAGNOSIS — E1122 Type 2 diabetes mellitus with diabetic chronic kidney disease: Secondary | ICD-10-CM | POA: Diagnosis not present

## 2020-09-18 DIAGNOSIS — K219 Gastro-esophageal reflux disease without esophagitis: Secondary | ICD-10-CM | POA: Diagnosis not present

## 2020-09-18 DIAGNOSIS — I480 Paroxysmal atrial fibrillation: Secondary | ICD-10-CM | POA: Diagnosis not present

## 2020-09-18 DIAGNOSIS — E785 Hyperlipidemia, unspecified: Secondary | ICD-10-CM | POA: Diagnosis not present

## 2020-09-18 DIAGNOSIS — U071 COVID-19: Secondary | ICD-10-CM | POA: Diagnosis not present

## 2020-09-18 DIAGNOSIS — N186 End stage renal disease: Secondary | ICD-10-CM | POA: Diagnosis not present

## 2020-09-18 DIAGNOSIS — K922 Gastrointestinal hemorrhage, unspecified: Secondary | ICD-10-CM | POA: Diagnosis not present

## 2020-09-18 DIAGNOSIS — D631 Anemia in chronic kidney disease: Secondary | ICD-10-CM | POA: Diagnosis not present

## 2020-09-19 DIAGNOSIS — K219 Gastro-esophageal reflux disease without esophagitis: Secondary | ICD-10-CM | POA: Diagnosis not present

## 2020-09-19 DIAGNOSIS — D631 Anemia in chronic kidney disease: Secondary | ICD-10-CM | POA: Diagnosis not present

## 2020-09-19 DIAGNOSIS — U071 COVID-19: Secondary | ICD-10-CM | POA: Diagnosis not present

## 2020-09-19 DIAGNOSIS — N186 End stage renal disease: Secondary | ICD-10-CM | POA: Diagnosis not present

## 2020-09-19 DIAGNOSIS — I480 Paroxysmal atrial fibrillation: Secondary | ICD-10-CM | POA: Diagnosis not present

## 2020-09-19 DIAGNOSIS — N2581 Secondary hyperparathyroidism of renal origin: Secondary | ICD-10-CM | POA: Diagnosis not present

## 2020-09-19 DIAGNOSIS — Z992 Dependence on renal dialysis: Secondary | ICD-10-CM | POA: Diagnosis not present

## 2020-09-19 DIAGNOSIS — I120 Hypertensive chronic kidney disease with stage 5 chronic kidney disease or end stage renal disease: Secondary | ICD-10-CM | POA: Diagnosis not present

## 2020-09-19 DIAGNOSIS — K922 Gastrointestinal hemorrhage, unspecified: Secondary | ICD-10-CM | POA: Diagnosis not present

## 2020-09-19 DIAGNOSIS — I6381 Other cerebral infarction due to occlusion or stenosis of small artery: Secondary | ICD-10-CM | POA: Diagnosis not present

## 2020-09-19 DIAGNOSIS — E1122 Type 2 diabetes mellitus with diabetic chronic kidney disease: Secondary | ICD-10-CM | POA: Diagnosis not present

## 2020-09-19 DIAGNOSIS — E785 Hyperlipidemia, unspecified: Secondary | ICD-10-CM | POA: Diagnosis not present

## 2020-09-22 DIAGNOSIS — N186 End stage renal disease: Secondary | ICD-10-CM | POA: Diagnosis not present

## 2020-09-22 DIAGNOSIS — N2581 Secondary hyperparathyroidism of renal origin: Secondary | ICD-10-CM | POA: Diagnosis not present

## 2020-09-22 DIAGNOSIS — E1122 Type 2 diabetes mellitus with diabetic chronic kidney disease: Secondary | ICD-10-CM | POA: Diagnosis not present

## 2020-09-22 DIAGNOSIS — Z992 Dependence on renal dialysis: Secondary | ICD-10-CM | POA: Diagnosis not present

## 2020-09-23 DIAGNOSIS — D631 Anemia in chronic kidney disease: Secondary | ICD-10-CM | POA: Diagnosis not present

## 2020-09-23 DIAGNOSIS — I120 Hypertensive chronic kidney disease with stage 5 chronic kidney disease or end stage renal disease: Secondary | ICD-10-CM | POA: Diagnosis not present

## 2020-09-23 DIAGNOSIS — N186 End stage renal disease: Secondary | ICD-10-CM | POA: Diagnosis not present

## 2020-09-23 DIAGNOSIS — K922 Gastrointestinal hemorrhage, unspecified: Secondary | ICD-10-CM | POA: Diagnosis not present

## 2020-09-23 DIAGNOSIS — E1122 Type 2 diabetes mellitus with diabetic chronic kidney disease: Secondary | ICD-10-CM | POA: Diagnosis not present

## 2020-09-23 DIAGNOSIS — E785 Hyperlipidemia, unspecified: Secondary | ICD-10-CM | POA: Diagnosis not present

## 2020-09-23 DIAGNOSIS — K219 Gastro-esophageal reflux disease without esophagitis: Secondary | ICD-10-CM | POA: Diagnosis not present

## 2020-09-23 DIAGNOSIS — U071 COVID-19: Secondary | ICD-10-CM | POA: Diagnosis not present

## 2020-09-23 DIAGNOSIS — I480 Paroxysmal atrial fibrillation: Secondary | ICD-10-CM | POA: Diagnosis not present

## 2020-09-24 DIAGNOSIS — Z992 Dependence on renal dialysis: Secondary | ICD-10-CM | POA: Diagnosis not present

## 2020-09-24 DIAGNOSIS — N2581 Secondary hyperparathyroidism of renal origin: Secondary | ICD-10-CM | POA: Diagnosis not present

## 2020-09-24 DIAGNOSIS — N186 End stage renal disease: Secondary | ICD-10-CM | POA: Diagnosis not present

## 2020-09-25 DIAGNOSIS — D631 Anemia in chronic kidney disease: Secondary | ICD-10-CM | POA: Diagnosis not present

## 2020-09-25 DIAGNOSIS — K219 Gastro-esophageal reflux disease without esophagitis: Secondary | ICD-10-CM | POA: Diagnosis not present

## 2020-09-25 DIAGNOSIS — I480 Paroxysmal atrial fibrillation: Secondary | ICD-10-CM | POA: Diagnosis not present

## 2020-09-25 DIAGNOSIS — U071 COVID-19: Secondary | ICD-10-CM | POA: Diagnosis not present

## 2020-09-25 DIAGNOSIS — E1122 Type 2 diabetes mellitus with diabetic chronic kidney disease: Secondary | ICD-10-CM | POA: Diagnosis not present

## 2020-09-25 DIAGNOSIS — I120 Hypertensive chronic kidney disease with stage 5 chronic kidney disease or end stage renal disease: Secondary | ICD-10-CM | POA: Diagnosis not present

## 2020-09-25 DIAGNOSIS — N186 End stage renal disease: Secondary | ICD-10-CM | POA: Diagnosis not present

## 2020-09-25 DIAGNOSIS — K922 Gastrointestinal hemorrhage, unspecified: Secondary | ICD-10-CM | POA: Diagnosis not present

## 2020-09-25 DIAGNOSIS — E785 Hyperlipidemia, unspecified: Secondary | ICD-10-CM | POA: Diagnosis not present

## 2020-09-26 DIAGNOSIS — N186 End stage renal disease: Secondary | ICD-10-CM | POA: Diagnosis not present

## 2020-09-26 DIAGNOSIS — Z992 Dependence on renal dialysis: Secondary | ICD-10-CM | POA: Diagnosis not present

## 2020-09-26 DIAGNOSIS — N2581 Secondary hyperparathyroidism of renal origin: Secondary | ICD-10-CM | POA: Diagnosis not present

## 2020-09-29 DIAGNOSIS — Z992 Dependence on renal dialysis: Secondary | ICD-10-CM | POA: Diagnosis not present

## 2020-09-29 DIAGNOSIS — N2581 Secondary hyperparathyroidism of renal origin: Secondary | ICD-10-CM | POA: Diagnosis not present

## 2020-09-29 DIAGNOSIS — N186 End stage renal disease: Secondary | ICD-10-CM | POA: Diagnosis not present

## 2020-09-30 DIAGNOSIS — E1122 Type 2 diabetes mellitus with diabetic chronic kidney disease: Secondary | ICD-10-CM | POA: Diagnosis not present

## 2020-09-30 DIAGNOSIS — E785 Hyperlipidemia, unspecified: Secondary | ICD-10-CM | POA: Diagnosis not present

## 2020-09-30 DIAGNOSIS — D631 Anemia in chronic kidney disease: Secondary | ICD-10-CM | POA: Diagnosis not present

## 2020-09-30 DIAGNOSIS — I480 Paroxysmal atrial fibrillation: Secondary | ICD-10-CM | POA: Diagnosis not present

## 2020-09-30 DIAGNOSIS — K219 Gastro-esophageal reflux disease without esophagitis: Secondary | ICD-10-CM | POA: Diagnosis not present

## 2020-09-30 DIAGNOSIS — K922 Gastrointestinal hemorrhage, unspecified: Secondary | ICD-10-CM | POA: Diagnosis not present

## 2020-09-30 DIAGNOSIS — N186 End stage renal disease: Secondary | ICD-10-CM | POA: Diagnosis not present

## 2020-09-30 DIAGNOSIS — I120 Hypertensive chronic kidney disease with stage 5 chronic kidney disease or end stage renal disease: Secondary | ICD-10-CM | POA: Diagnosis not present

## 2020-09-30 DIAGNOSIS — U071 COVID-19: Secondary | ICD-10-CM | POA: Diagnosis not present

## 2020-10-01 DIAGNOSIS — Z992 Dependence on renal dialysis: Secondary | ICD-10-CM | POA: Diagnosis not present

## 2020-10-01 DIAGNOSIS — N2581 Secondary hyperparathyroidism of renal origin: Secondary | ICD-10-CM | POA: Diagnosis not present

## 2020-10-01 DIAGNOSIS — N186 End stage renal disease: Secondary | ICD-10-CM | POA: Diagnosis not present

## 2020-10-02 DIAGNOSIS — I120 Hypertensive chronic kidney disease with stage 5 chronic kidney disease or end stage renal disease: Secondary | ICD-10-CM | POA: Diagnosis not present

## 2020-10-02 DIAGNOSIS — E785 Hyperlipidemia, unspecified: Secondary | ICD-10-CM | POA: Diagnosis not present

## 2020-10-02 DIAGNOSIS — D631 Anemia in chronic kidney disease: Secondary | ICD-10-CM | POA: Diagnosis not present

## 2020-10-02 DIAGNOSIS — E1122 Type 2 diabetes mellitus with diabetic chronic kidney disease: Secondary | ICD-10-CM | POA: Diagnosis not present

## 2020-10-02 DIAGNOSIS — K219 Gastro-esophageal reflux disease without esophagitis: Secondary | ICD-10-CM | POA: Diagnosis not present

## 2020-10-02 DIAGNOSIS — U071 COVID-19: Secondary | ICD-10-CM | POA: Diagnosis not present

## 2020-10-02 DIAGNOSIS — I480 Paroxysmal atrial fibrillation: Secondary | ICD-10-CM | POA: Diagnosis not present

## 2020-10-02 DIAGNOSIS — K922 Gastrointestinal hemorrhage, unspecified: Secondary | ICD-10-CM | POA: Diagnosis not present

## 2020-10-02 DIAGNOSIS — N186 End stage renal disease: Secondary | ICD-10-CM | POA: Diagnosis not present

## 2020-10-03 DIAGNOSIS — Z992 Dependence on renal dialysis: Secondary | ICD-10-CM | POA: Diagnosis not present

## 2020-10-03 DIAGNOSIS — N186 End stage renal disease: Secondary | ICD-10-CM | POA: Diagnosis not present

## 2020-10-03 DIAGNOSIS — N2581 Secondary hyperparathyroidism of renal origin: Secondary | ICD-10-CM | POA: Diagnosis not present

## 2020-10-06 DIAGNOSIS — N186 End stage renal disease: Secondary | ICD-10-CM | POA: Diagnosis not present

## 2020-10-06 DIAGNOSIS — N2581 Secondary hyperparathyroidism of renal origin: Secondary | ICD-10-CM | POA: Diagnosis not present

## 2020-10-06 DIAGNOSIS — Z992 Dependence on renal dialysis: Secondary | ICD-10-CM | POA: Diagnosis not present

## 2020-10-07 DIAGNOSIS — U071 COVID-19: Secondary | ICD-10-CM | POA: Diagnosis not present

## 2020-10-07 DIAGNOSIS — E1122 Type 2 diabetes mellitus with diabetic chronic kidney disease: Secondary | ICD-10-CM | POA: Diagnosis not present

## 2020-10-07 DIAGNOSIS — I480 Paroxysmal atrial fibrillation: Secondary | ICD-10-CM | POA: Diagnosis not present

## 2020-10-07 DIAGNOSIS — I120 Hypertensive chronic kidney disease with stage 5 chronic kidney disease or end stage renal disease: Secondary | ICD-10-CM | POA: Diagnosis not present

## 2020-10-07 DIAGNOSIS — K219 Gastro-esophageal reflux disease without esophagitis: Secondary | ICD-10-CM | POA: Diagnosis not present

## 2020-10-07 DIAGNOSIS — N186 End stage renal disease: Secondary | ICD-10-CM | POA: Diagnosis not present

## 2020-10-07 DIAGNOSIS — K922 Gastrointestinal hemorrhage, unspecified: Secondary | ICD-10-CM | POA: Diagnosis not present

## 2020-10-07 DIAGNOSIS — D631 Anemia in chronic kidney disease: Secondary | ICD-10-CM | POA: Diagnosis not present

## 2020-10-07 DIAGNOSIS — E785 Hyperlipidemia, unspecified: Secondary | ICD-10-CM | POA: Diagnosis not present

## 2020-10-08 DIAGNOSIS — N2581 Secondary hyperparathyroidism of renal origin: Secondary | ICD-10-CM | POA: Diagnosis not present

## 2020-10-08 DIAGNOSIS — Z992 Dependence on renal dialysis: Secondary | ICD-10-CM | POA: Diagnosis not present

## 2020-10-08 DIAGNOSIS — N186 End stage renal disease: Secondary | ICD-10-CM | POA: Diagnosis not present

## 2020-10-09 DIAGNOSIS — U071 COVID-19: Secondary | ICD-10-CM | POA: Diagnosis not present

## 2020-10-09 DIAGNOSIS — K922 Gastrointestinal hemorrhage, unspecified: Secondary | ICD-10-CM | POA: Diagnosis not present

## 2020-10-09 DIAGNOSIS — D631 Anemia in chronic kidney disease: Secondary | ICD-10-CM | POA: Diagnosis not present

## 2020-10-09 DIAGNOSIS — I120 Hypertensive chronic kidney disease with stage 5 chronic kidney disease or end stage renal disease: Secondary | ICD-10-CM | POA: Diagnosis not present

## 2020-10-09 DIAGNOSIS — I480 Paroxysmal atrial fibrillation: Secondary | ICD-10-CM | POA: Diagnosis not present

## 2020-10-09 DIAGNOSIS — K219 Gastro-esophageal reflux disease without esophagitis: Secondary | ICD-10-CM | POA: Diagnosis not present

## 2020-10-09 DIAGNOSIS — E1122 Type 2 diabetes mellitus with diabetic chronic kidney disease: Secondary | ICD-10-CM | POA: Diagnosis not present

## 2020-10-09 DIAGNOSIS — N186 End stage renal disease: Secondary | ICD-10-CM | POA: Diagnosis not present

## 2020-10-09 DIAGNOSIS — E785 Hyperlipidemia, unspecified: Secondary | ICD-10-CM | POA: Diagnosis not present

## 2020-10-10 DIAGNOSIS — Z992 Dependence on renal dialysis: Secondary | ICD-10-CM | POA: Diagnosis not present

## 2020-10-10 DIAGNOSIS — N2581 Secondary hyperparathyroidism of renal origin: Secondary | ICD-10-CM | POA: Diagnosis not present

## 2020-10-10 DIAGNOSIS — N186 End stage renal disease: Secondary | ICD-10-CM | POA: Diagnosis not present

## 2020-10-13 DIAGNOSIS — N186 End stage renal disease: Secondary | ICD-10-CM | POA: Diagnosis not present

## 2020-10-13 DIAGNOSIS — N2581 Secondary hyperparathyroidism of renal origin: Secondary | ICD-10-CM | POA: Diagnosis not present

## 2020-10-13 DIAGNOSIS — Z992 Dependence on renal dialysis: Secondary | ICD-10-CM | POA: Diagnosis not present

## 2020-10-14 DIAGNOSIS — U071 COVID-19: Secondary | ICD-10-CM | POA: Diagnosis not present

## 2020-10-14 DIAGNOSIS — I480 Paroxysmal atrial fibrillation: Secondary | ICD-10-CM | POA: Diagnosis not present

## 2020-10-14 DIAGNOSIS — E785 Hyperlipidemia, unspecified: Secondary | ICD-10-CM | POA: Diagnosis not present

## 2020-10-14 DIAGNOSIS — E1122 Type 2 diabetes mellitus with diabetic chronic kidney disease: Secondary | ICD-10-CM | POA: Diagnosis not present

## 2020-10-14 DIAGNOSIS — N186 End stage renal disease: Secondary | ICD-10-CM | POA: Diagnosis not present

## 2020-10-14 DIAGNOSIS — D631 Anemia in chronic kidney disease: Secondary | ICD-10-CM | POA: Diagnosis not present

## 2020-10-14 DIAGNOSIS — I120 Hypertensive chronic kidney disease with stage 5 chronic kidney disease or end stage renal disease: Secondary | ICD-10-CM | POA: Diagnosis not present

## 2020-10-14 DIAGNOSIS — K219 Gastro-esophageal reflux disease without esophagitis: Secondary | ICD-10-CM | POA: Diagnosis not present

## 2020-10-14 DIAGNOSIS — K922 Gastrointestinal hemorrhage, unspecified: Secondary | ICD-10-CM | POA: Diagnosis not present

## 2020-10-15 DIAGNOSIS — Z992 Dependence on renal dialysis: Secondary | ICD-10-CM | POA: Diagnosis not present

## 2020-10-15 DIAGNOSIS — N2581 Secondary hyperparathyroidism of renal origin: Secondary | ICD-10-CM | POA: Diagnosis not present

## 2020-10-15 DIAGNOSIS — N186 End stage renal disease: Secondary | ICD-10-CM | POA: Diagnosis not present

## 2020-10-17 DIAGNOSIS — N186 End stage renal disease: Secondary | ICD-10-CM | POA: Diagnosis not present

## 2020-10-17 DIAGNOSIS — Z992 Dependence on renal dialysis: Secondary | ICD-10-CM | POA: Diagnosis not present

## 2020-10-17 DIAGNOSIS — N2581 Secondary hyperparathyroidism of renal origin: Secondary | ICD-10-CM | POA: Diagnosis not present

## 2020-10-20 DIAGNOSIS — Z992 Dependence on renal dialysis: Secondary | ICD-10-CM | POA: Diagnosis not present

## 2020-10-20 DIAGNOSIS — N186 End stage renal disease: Secondary | ICD-10-CM | POA: Diagnosis not present

## 2020-10-20 DIAGNOSIS — N2581 Secondary hyperparathyroidism of renal origin: Secondary | ICD-10-CM | POA: Diagnosis not present

## 2020-10-22 DIAGNOSIS — N2581 Secondary hyperparathyroidism of renal origin: Secondary | ICD-10-CM | POA: Diagnosis not present

## 2020-10-22 DIAGNOSIS — N186 End stage renal disease: Secondary | ICD-10-CM | POA: Diagnosis not present

## 2020-10-22 DIAGNOSIS — Z992 Dependence on renal dialysis: Secondary | ICD-10-CM | POA: Diagnosis not present

## 2020-10-23 DIAGNOSIS — N186 End stage renal disease: Secondary | ICD-10-CM | POA: Diagnosis not present

## 2020-10-23 DIAGNOSIS — E1122 Type 2 diabetes mellitus with diabetic chronic kidney disease: Secondary | ICD-10-CM | POA: Diagnosis not present

## 2020-10-23 DIAGNOSIS — Z992 Dependence on renal dialysis: Secondary | ICD-10-CM | POA: Diagnosis not present

## 2020-10-24 DIAGNOSIS — N186 End stage renal disease: Secondary | ICD-10-CM | POA: Diagnosis not present

## 2020-10-24 DIAGNOSIS — Z992 Dependence on renal dialysis: Secondary | ICD-10-CM | POA: Diagnosis not present

## 2020-10-24 DIAGNOSIS — N2581 Secondary hyperparathyroidism of renal origin: Secondary | ICD-10-CM | POA: Diagnosis not present

## 2020-10-27 DIAGNOSIS — N186 End stage renal disease: Secondary | ICD-10-CM | POA: Diagnosis not present

## 2020-10-27 DIAGNOSIS — Z992 Dependence on renal dialysis: Secondary | ICD-10-CM | POA: Diagnosis not present

## 2020-10-27 DIAGNOSIS — N2581 Secondary hyperparathyroidism of renal origin: Secondary | ICD-10-CM | POA: Diagnosis not present

## 2020-10-29 DIAGNOSIS — Z992 Dependence on renal dialysis: Secondary | ICD-10-CM | POA: Diagnosis not present

## 2020-10-29 DIAGNOSIS — N186 End stage renal disease: Secondary | ICD-10-CM | POA: Diagnosis not present

## 2020-10-29 DIAGNOSIS — N2581 Secondary hyperparathyroidism of renal origin: Secondary | ICD-10-CM | POA: Diagnosis not present

## 2020-10-31 DIAGNOSIS — N186 End stage renal disease: Secondary | ICD-10-CM | POA: Diagnosis not present

## 2020-10-31 DIAGNOSIS — Z992 Dependence on renal dialysis: Secondary | ICD-10-CM | POA: Diagnosis not present

## 2020-10-31 DIAGNOSIS — N2581 Secondary hyperparathyroidism of renal origin: Secondary | ICD-10-CM | POA: Diagnosis not present

## 2020-11-03 ENCOUNTER — Other Ambulatory Visit: Payer: Self-pay

## 2020-11-03 DIAGNOSIS — N183 Chronic kidney disease, stage 3 unspecified: Secondary | ICD-10-CM

## 2020-11-03 DIAGNOSIS — N186 End stage renal disease: Secondary | ICD-10-CM | POA: Diagnosis not present

## 2020-11-03 DIAGNOSIS — N2581 Secondary hyperparathyroidism of renal origin: Secondary | ICD-10-CM | POA: Diagnosis not present

## 2020-11-03 DIAGNOSIS — Z992 Dependence on renal dialysis: Secondary | ICD-10-CM | POA: Diagnosis not present

## 2020-11-03 NOTE — Addendum Note (Signed)
Addended byDoylene Bode on: 11/03/2020 05:48 PM   Modules accepted: Orders

## 2020-11-05 DIAGNOSIS — N186 End stage renal disease: Secondary | ICD-10-CM | POA: Diagnosis not present

## 2020-11-05 DIAGNOSIS — Z992 Dependence on renal dialysis: Secondary | ICD-10-CM | POA: Diagnosis not present

## 2020-11-05 DIAGNOSIS — N2581 Secondary hyperparathyroidism of renal origin: Secondary | ICD-10-CM | POA: Diagnosis not present

## 2020-11-07 DIAGNOSIS — N186 End stage renal disease: Secondary | ICD-10-CM | POA: Diagnosis not present

## 2020-11-07 DIAGNOSIS — I6782 Cerebral ischemia: Secondary | ICD-10-CM | POA: Diagnosis not present

## 2020-11-07 DIAGNOSIS — I120 Hypertensive chronic kidney disease with stage 5 chronic kidney disease or end stage renal disease: Secondary | ICD-10-CM | POA: Diagnosis not present

## 2020-11-07 DIAGNOSIS — Z8673 Personal history of transient ischemic attack (TIA), and cerebral infarction without residual deficits: Secondary | ICD-10-CM | POA: Diagnosis not present

## 2020-11-07 DIAGNOSIS — R519 Headache, unspecified: Secondary | ICD-10-CM | POA: Diagnosis not present

## 2020-11-07 DIAGNOSIS — H66009 Acute suppurative otitis media without spontaneous rupture of ear drum, unspecified ear: Secondary | ICD-10-CM | POA: Diagnosis not present

## 2020-11-07 DIAGNOSIS — E1122 Type 2 diabetes mellitus with diabetic chronic kidney disease: Secondary | ICD-10-CM | POA: Diagnosis not present

## 2020-11-07 DIAGNOSIS — Z7982 Long term (current) use of aspirin: Secondary | ICD-10-CM | POA: Diagnosis not present

## 2020-11-07 DIAGNOSIS — H6691 Otitis media, unspecified, right ear: Secondary | ICD-10-CM | POA: Diagnosis not present

## 2020-11-07 DIAGNOSIS — N2581 Secondary hyperparathyroidism of renal origin: Secondary | ICD-10-CM | POA: Diagnosis not present

## 2020-11-07 DIAGNOSIS — M1990 Unspecified osteoarthritis, unspecified site: Secondary | ICD-10-CM | POA: Diagnosis not present

## 2020-11-07 DIAGNOSIS — I6623 Occlusion and stenosis of bilateral posterior cerebral arteries: Secondary | ICD-10-CM | POA: Diagnosis not present

## 2020-11-07 DIAGNOSIS — I6523 Occlusion and stenosis of bilateral carotid arteries: Secondary | ICD-10-CM | POA: Diagnosis not present

## 2020-11-07 DIAGNOSIS — I672 Cerebral atherosclerosis: Secondary | ICD-10-CM | POA: Diagnosis not present

## 2020-11-07 DIAGNOSIS — S161XXA Strain of muscle, fascia and tendon at neck level, initial encounter: Secondary | ICD-10-CM | POA: Diagnosis not present

## 2020-11-07 DIAGNOSIS — I6503 Occlusion and stenosis of bilateral vertebral arteries: Secondary | ICD-10-CM | POA: Diagnosis not present

## 2020-11-07 DIAGNOSIS — E7800 Pure hypercholesterolemia, unspecified: Secondary | ICD-10-CM | POA: Diagnosis not present

## 2020-11-07 DIAGNOSIS — Z992 Dependence on renal dialysis: Secondary | ICD-10-CM | POA: Diagnosis not present

## 2020-11-07 DIAGNOSIS — G8929 Other chronic pain: Secondary | ICD-10-CM | POA: Diagnosis not present

## 2020-11-07 DIAGNOSIS — N185 Chronic kidney disease, stage 5: Secondary | ICD-10-CM | POA: Diagnosis not present

## 2020-11-10 DIAGNOSIS — N2581 Secondary hyperparathyroidism of renal origin: Secondary | ICD-10-CM | POA: Diagnosis not present

## 2020-11-10 DIAGNOSIS — Z992 Dependence on renal dialysis: Secondary | ICD-10-CM | POA: Diagnosis not present

## 2020-11-10 DIAGNOSIS — N186 End stage renal disease: Secondary | ICD-10-CM | POA: Diagnosis not present

## 2020-11-12 DIAGNOSIS — N186 End stage renal disease: Secondary | ICD-10-CM | POA: Diagnosis not present

## 2020-11-12 DIAGNOSIS — Z992 Dependence on renal dialysis: Secondary | ICD-10-CM | POA: Diagnosis not present

## 2020-11-12 DIAGNOSIS — N2581 Secondary hyperparathyroidism of renal origin: Secondary | ICD-10-CM | POA: Diagnosis not present

## 2020-11-14 ENCOUNTER — Ambulatory Visit: Payer: Medicare HMO | Admitting: Cardiovascular Disease

## 2020-11-14 DIAGNOSIS — Z992 Dependence on renal dialysis: Secondary | ICD-10-CM | POA: Diagnosis not present

## 2020-11-14 DIAGNOSIS — N186 End stage renal disease: Secondary | ICD-10-CM | POA: Diagnosis not present

## 2020-11-14 DIAGNOSIS — N2581 Secondary hyperparathyroidism of renal origin: Secondary | ICD-10-CM | POA: Diagnosis not present

## 2020-11-17 DIAGNOSIS — Z992 Dependence on renal dialysis: Secondary | ICD-10-CM | POA: Diagnosis not present

## 2020-11-17 DIAGNOSIS — N2581 Secondary hyperparathyroidism of renal origin: Secondary | ICD-10-CM | POA: Diagnosis not present

## 2020-11-17 DIAGNOSIS — N186 End stage renal disease: Secondary | ICD-10-CM | POA: Diagnosis not present

## 2020-11-18 ENCOUNTER — Ambulatory Visit: Payer: Medicare HMO | Admitting: Cardiovascular Disease

## 2020-11-18 ENCOUNTER — Other Ambulatory Visit: Payer: Self-pay

## 2020-11-18 ENCOUNTER — Encounter: Payer: Self-pay | Admitting: Cardiovascular Disease

## 2020-11-18 DIAGNOSIS — I10 Essential (primary) hypertension: Secondary | ICD-10-CM

## 2020-11-18 DIAGNOSIS — N186 End stage renal disease: Secondary | ICD-10-CM | POA: Diagnosis not present

## 2020-11-18 DIAGNOSIS — I4891 Unspecified atrial fibrillation: Secondary | ICD-10-CM | POA: Diagnosis not present

## 2020-11-18 DIAGNOSIS — E782 Mixed hyperlipidemia: Secondary | ICD-10-CM | POA: Diagnosis not present

## 2020-11-18 DIAGNOSIS — N185 Chronic kidney disease, stage 5: Secondary | ICD-10-CM | POA: Diagnosis not present

## 2020-11-18 DIAGNOSIS — E1169 Type 2 diabetes mellitus with other specified complication: Secondary | ICD-10-CM | POA: Diagnosis not present

## 2020-11-18 DIAGNOSIS — Z992 Dependence on renal dialysis: Secondary | ICD-10-CM | POA: Diagnosis not present

## 2020-11-18 DIAGNOSIS — Z1331 Encounter for screening for depression: Secondary | ICD-10-CM | POA: Diagnosis not present

## 2020-11-18 DIAGNOSIS — D631 Anemia in chronic kidney disease: Secondary | ICD-10-CM | POA: Diagnosis not present

## 2020-11-18 DIAGNOSIS — Z0000 Encounter for general adult medical examination without abnormal findings: Secondary | ICD-10-CM | POA: Diagnosis not present

## 2020-11-18 NOTE — Assessment & Plan Note (Signed)
History of essential hypertension a blood pressure measured today at 153/77.  She is on diltiazem and hydralazine.  She does get hypotensive on dialysis days and was told to take hydralazine once a day instead of twice

## 2020-11-18 NOTE — Assessment & Plan Note (Addendum)
History of PAF in the past during hospitalization back in December 7654 complicated by CNS bleed, GI bleed.  She was ultimately placed on low-dose Eliquis and has had no significant recurrence of A. Fib. This patients CHA2DS2-VASc Score and unadjusted Ischemic Stroke Rate (% per year) is equal to 11.2 % stroke rate/year from a score of 7  Above score calculated as 1 point each if present [CHF, HTN, DM, Vascular=MI/PAD/Aortic Plaque, Age if 65-74, or Female] Above score calculated as 2 points each if present [Age > 75, or Stroke/TIA/TE]

## 2020-11-18 NOTE — Patient Instructions (Signed)
Medication Instructions:  Your physician recommends that you continue on your current medications as directed. Please refer to the Current Medication list given to you today.  *If you need a refill on your cardiac medications before your next appointment, please call your pharmacy*   Follow-Up: At [facility], you and your health needs are our priority.  As part of our continuing mission to provide you with exceptional heart care, we have created designated Provider Care Teams.  These Care Teams include your primary Cardiologist (physician) and Advanced Practice Providers (APPs -  Physician Assistants and Nurse Practitioners) who all work together to provide you with the care you need, when you need it.  We recommend signing up for the patient portal called "MyChart".  Sign up information is provided on this After Visit Summary.  MyChart is used to connect with patients for Virtual Visits (Telemedicine).  Patients are able to view lab/test results, encounter notes, upcoming appointments, etc.  Non-urgent messages can be sent to your provider as well.   To learn more about what you can do with MyChart, go to [email or web address].    Your next appointment:   6 month(s)  The format for your next appointment:   In Person  Provider:   You will see one of the following Advanced Practice Providers on your designated Care Team:    Callie Goodrich, PA-C  Jesse Cleaver, FNP  Then, Jonathan Berry, MD will plan to see you again in 12 month(s). 

## 2020-11-18 NOTE — Progress Notes (Signed)
11/18/2020 [name]   05/16/1941  [id number]  Primary Physician [name], MD Primary Cardiologist: [name] MD [name], [place]  HPI:  [name] is a 80 y.o. frail-appearing married Caucasian female accompanied by her daughter [name] today.  I last saw her during her hospitalization in December for PAF and stroke.  She does have history of hypertension, hyperlipidemia, type 2 diabetes and end-stage renal disease on hemodialysis.  I saw her in the hospital 07/15/2020.  She was transfused at that time and ultimately was placed on low-dose Eliquis oral anticoagulation.  Her 2D echo was normal and she had no evidence of carotid artery disease.  Her major issue now is weakness probably secondary to hemodialysis and low hemoglobin.   Current Meds  Medication Sig  . amiodarone (PACERONE) 200 MG tablet Take 1 tablet (200 mg total) by mouth daily.  Marland Kitchen apixaban (ELIQUIS) 2.5 MG TABS tablet Take 1 tablet (2.5 mg total) by mouth 2 (two) times daily.  Marland Kitchen ascorbic acid (VITAMIN C) 500 MG tablet Take 500 mg by mouth 2 (two) times daily.  Marland Kitchen atorvastatin (LIPITOR) 20 MG tablet Take 20 mg by mouth at bedtime.  . Calcium Carbonate (CALTRATE 600 PO) Take 2 tablets by mouth daily.  . Cholecalciferol (VITAMIN D) 2000 UNITS tablet Take 2,000 Units by mouth daily.  Marland Kitchen diltiazem (CARDIZEM CD) 120 MG 24 hr capsule Take 1 capsule (120 mg total) by mouth daily.  Marland Kitchen glipiZIDE (GLUCOTROL XL) 2.5 MG 24 hr tablet Take 5 mg by mouth daily.  . hydrALAZINE (APRESOLINE) 25 MG tablet Take 1 tablet (25 mg total) by mouth 2 (two) times daily. (Patient taking differently: Take 25 mg by mouth 2 (two) times daily. 2 tablets twice daily)  . Insulin Degludec-Liraglutide (XULTOPHY) 100-3.6 UNIT-MG/ML SOPN Inject 26 Units into the skin every morning.  Marland Kitchen letrozole (FEMARA) 2.5 MG tablet Take 1 tablet (2.5 mg total) by mouth daily. (Patient taking differently: Take 2.5 mg by mouth at bedtime.)  . pantoprazole  (PROTONIX) 40 MG tablet Take 1 tablet (40 mg total) by mouth 2 (two) times daily.  . sevelamer carbonate (RENVELA) 800 MG tablet Take 800 mg by mouth 3 (three) times daily with meals.  . zinc sulfate 220 (50 Zn) MG capsule Take 220 mg by mouth 2 (two) times daily.     No Known Allergies  Social History   Socioeconomic History  . Marital status: Married    Spouse name: Not on file  . Number of children: Not on file  . Years of education: Not on file  . Highest education level: Not on file  Occupational History  . Not on file  Tobacco Use  . Smoking status: Never Smoker  . Smokeless tobacco: Never Used  Vaping Use  . Vaping Use: Never used  Substance and Sexual Activity  . Alcohol use: No  . Drug use: No  . Sexual activity: Not Currently  Other Topics Concern  . Not on file  Social History Narrative  . Not on file   Social Determinants of Health   Financial Resource Strain: Not on file  Food Insecurity: Not on file  Transportation Needs: Not on file  Physical Activity: Not on file  Stress: Not on file  Social Connections: Not on file  Intimate Partner Violence: Not on file     Review of Systems: General: negative for chills, fever, night sweats or weight changes.  Cardiovascular: negative for chest pain, dyspnea  on exertion, edema, orthopnea, palpitations, paroxysmal nocturnal dyspnea or shortness of breath Dermatological: negative for rash Respiratory: negative for cough or wheezing Urologic: negative for hematuria Abdominal: negative for nausea, vomiting, diarrhea, bright red blood per rectum, melena, or hematemesis Neurologic: negative for visual changes, syncope, or dizziness All other systems reviewed and are otherwise negative except as noted above.    Blood pressure 137/70, pulse 82, height 5\' 6"  (1.676 m), weight 167 lb (75.8 kg), SpO2 95 %.  General appearance: alert and no distress Neck: no adenopathy, no carotid bruit, no JVD, supple, symmetrical,  trachea midline and thyroid not enlarged, symmetric, no tenderness/mass/nodules Lungs: clear to auscultation bilaterally Heart: Soft outflow tract murmur Extremities: extremities normal, atraumatic, no cyanosis or edema Pulses: 2+ and symmetric Skin: Skin color, texture, turgor normal. No rashes or lesions Neurologic: Alert and oriented X 3, normal strength and tone. Normal symmetric reflexes. Normal coordination and gait  EKG sinus rhythm at 82 with poor R wave progression and nonspecific ST and T wave changes.  Personally reviewed this EKG.  ASSESSMENT AND PLAN:   Essential hypertension History of essential hypertension a blood pressure measured today at 153/77.  She is on diltiazem and hydralazine.  She does get hypotensive on dialysis days and was told to take hydralazine once a day instead of twice  Atrial fibrillation with RVR ([place]) History of PAF in the past during hospitalization back in December 6314 complicated by CNS bleed, GI bleed.  She was ultimately placed on low-dose Eliquis and has had no significant recurrence of A. Fib. This patients CHA2DS2-VASc Score and unadjusted Ischemic Stroke Rate (% per year) is equal to 11.2 % stroke rate/year from a score of 7  Above score calculated as 1 point each if present [CHF, HTN, DM, Vascular=MI/PAD/Aortic Plaque, Age if 65-74, or Female] Above score calculated as 2 points each if present [Age > 75, or Stroke/TIA/TE]       [name] MD [facility], [facility] 11/18/2020 10:58 AM

## 2020-11-19 DIAGNOSIS — Z992 Dependence on renal dialysis: Secondary | ICD-10-CM | POA: Diagnosis not present

## 2020-11-19 DIAGNOSIS — N186 End stage renal disease: Secondary | ICD-10-CM | POA: Diagnosis not present

## 2020-11-19 DIAGNOSIS — N2581 Secondary hyperparathyroidism of renal origin: Secondary | ICD-10-CM | POA: Diagnosis not present

## 2020-11-21 DIAGNOSIS — N186 End stage renal disease: Secondary | ICD-10-CM | POA: Diagnosis not present

## 2020-11-21 DIAGNOSIS — N2581 Secondary hyperparathyroidism of renal origin: Secondary | ICD-10-CM | POA: Diagnosis not present

## 2020-11-21 DIAGNOSIS — Z992 Dependence on renal dialysis: Secondary | ICD-10-CM | POA: Diagnosis not present

## 2020-11-22 DIAGNOSIS — Z992 Dependence on renal dialysis: Secondary | ICD-10-CM | POA: Diagnosis not present

## 2020-11-22 DIAGNOSIS — N186 End stage renal disease: Secondary | ICD-10-CM | POA: Diagnosis not present

## 2020-11-22 DIAGNOSIS — E1122 Type 2 diabetes mellitus with diabetic chronic kidney disease: Secondary | ICD-10-CM | POA: Diagnosis not present

## 2020-11-24 DIAGNOSIS — D649 Anemia, unspecified: Secondary | ICD-10-CM | POA: Diagnosis not present

## 2020-11-24 DIAGNOSIS — N186 End stage renal disease: Secondary | ICD-10-CM | POA: Diagnosis not present

## 2020-11-24 DIAGNOSIS — N2581 Secondary hyperparathyroidism of renal origin: Secondary | ICD-10-CM | POA: Diagnosis not present

## 2020-11-24 DIAGNOSIS — Z992 Dependence on renal dialysis: Secondary | ICD-10-CM | POA: Diagnosis not present

## 2020-11-24 NOTE — Progress Notes (Deleted)
[facility]   ASSESSMENT / PLAN: [name] is a 80 y.o. *** handed female in need of permanent hemodialysis access. I reviewed options for dialysis in detail with the patient. I counseled the patient that dialysis access requires surveillance and periodic maintenance. Plan to proceed with ***.   CHIEF COMPLAINT: ***  HISTORY OF PRESENT ILLNESS: [name] is a 80 y.o. female ***  Past Medical History:  Diagnosis Date  . Cancer ([place])   . Diabetes mellitus   . Hyperlipidemia   . Hypertension    pt states at times    Past Surgical History:  Procedure Laterality Date  . BIOPSY  07/17/2020   Procedure: BIOPSY;  Surgeon: Yetta Flock, MD;  Location: [facility];  Service: Gastroenterology;;  . BREAST LUMPECTOMY  03/26/2011   right  . ESOPHAGOGASTRODUODENOSCOPY (EGD) WITH PROPOFOL N/A 07/17/2020   Procedure: ESOPHAGOGASTRODUODENOSCOPY (EGD) WITH PROPOFOL;  Surgeon: Yetta Flock, MD;  Location: [facility];  Service: Gastroenterology;  Laterality: N/A;    Family History  Problem Relation Age of Onset  . Cancer Father        bronchial  . Cancer Paternal Aunt        breast  . Cancer Paternal Aunt        breast    Social History   Socioeconomic History  . Marital status: Married    Spouse name: Not on file  . Number of children: Not on file  . Years of education: Not on file  . Highest education level: Not on file  Occupational History  . Not on file  Tobacco Use  . Smoking status: Never Smoker  . Smokeless tobacco: Never Used  Vaping Use  . Vaping Use: Never used  Substance and Sexual Activity  . Alcohol use: No  . Drug use: No  . Sexual activity: Not Currently  Other Topics Concern  . Not on file  Social History Narrative  . Not on file   Social Determinants of Health   Financial Resource Strain: Not on file  Food Insecurity: Not on file  Transportation Needs: Not on file  Physical Activity: Not on file   Stress: Not on file  Social Connections: Not on file  Intimate Partner Violence: Not on file    No Known Allergies  Current Outpatient Medications  Medication Sig Dispense Refill  . amiodarone (PACERONE) 200 MG tablet Take 1 tablet (200 mg total) by mouth daily. 30 tablet 0  . apixaban (ELIQUIS) 2.5 MG TABS tablet Take 1 tablet (2.5 mg total) by mouth 2 (two) times daily. 180 tablet 1  . ascorbic acid (VITAMIN C) 500 MG tablet Take 500 mg by mouth 2 (two) times daily.    Marland Kitchen atorvastatin (LIPITOR) 20 MG tablet Take 20 mg by mouth at bedtime.    . Calcium Carbonate (CALTRATE 600 PO) Take 2 tablets by mouth daily.    . Cholecalciferol (VITAMIN D) 2000 UNITS tablet Take 2,000 Units by mouth daily.    Marland Kitchen diltiazem (CARDIZEM CD) 120 MG 24 hr capsule Take 1 capsule (120 mg total) by mouth daily. 30 capsule 0  . glipiZIDE (GLUCOTROL XL) 2.5 MG 24 hr tablet Take 5 mg by mouth daily.    . hydrALAZINE (APRESOLINE) 25 MG tablet Take 1 tablet (25 mg total) by mouth 2 (two) times daily. (Patient taking differently: Take 25 mg by mouth 2 (two) times daily. 2 tablets twice daily)    . Insulin Degludec-Liraglutide (XULTOPHY) 100-3.6 UNIT-MG/ML  SOPN Inject 26 Units into the skin every morning.    Marland Kitchen letrozole (FEMARA) 2.5 MG tablet Take 1 tablet (2.5 mg total) by mouth daily. (Patient taking differently: Take 2.5 mg by mouth at bedtime.) 90 tablet 3  . pantoprazole (PROTONIX) 40 MG tablet Take 1 tablet (40 mg total) by mouth 2 (two) times daily. 60 tablet 0  . sevelamer carbonate (RENVELA) 800 MG tablet Take 800 mg by mouth 3 (three) times daily with meals.    . zinc sulfate 220 (50 Zn) MG capsule Take 220 mg by mouth 2 (two) times daily.     No current facility-administered medications for this visit.    REVIEW OF SYSTEMS:  [X]  denotes positive finding, [ ]  denotes negative finding Cardiac  Comments:  Chest pain or chest pressure: ***   Shortness of breath upon exertion:    Short of breath when lying  flat:    Irregular heart rhythm:        Vascular    Pain in calf, thigh, or hip brought on by ambulation:    Pain in feet at night that wakes you up from your sleep:     Blood clot in your veins:    Leg swelling:         Pulmonary    Oxygen at home:    Productive cough:     Wheezing:         Neurologic    Sudden weakness in arms or legs:     Sudden numbness in arms or legs:     Sudden onset of difficulty speaking or slurred speech:    Temporary loss of vision in one eye:     Problems with dizziness:         Gastrointestinal    Blood in stool:     Vomited blood:         Genitourinary    Burning when urinating:     Blood in urine:        Psychiatric    Major depression:         Hematologic    Bleeding problems:    Problems with blood clotting too easily:        Skin    Rashes or ulcers:        Constitutional    Fever or chills:      PHYSICAL EXAM  There were no vitals filed for this visit.  Constitutional: *** appearing. *** distress. Appears *** nourished.  Neurologic: CN ***. *** focal findings. *** sensory loss. Psychiatric: Mood and affect symmetric and appropriate. Eyes: No icterus. No conjunctival pallor. Ears, nose, throat: mucous membranes moist. Midline trachea.  Cardiac: *** rate and rhythm.  Respiratory: unlabored. Abdominal: soft, non-tender, non-distended.  Peripheral vascular:  *** Extremity: No edema. No cyanosis. No pallor.  Skin: No gangrene. No ulceration.  Lymphatic: No Stemmer's sign. No palpable lymphadenopathy.  PERTINENT LABORATORY AND RADIOLOGIC DATA  Most recent CBC CBC Latest Ref Rng & Units 07/30/2020 07/28/2020 07/27/2020  WBC 4.0 - 10.5 K/uL 5.7 5.5 5.1  Hemoglobin 12.0 - 15.0 g/dL 9.6(L) 10.8(L) 9.7(L)  Hematocrit 36.0 - 46.0 % 30.2(L) 33.7(L) 29.2(L)  Platelets 150 - 400 K/uL 206 198 207     Most recent CMP CMP Latest Ref Rng & Units 07/30/2020 07/28/2020 07/27/2020  Glucose 70 - 99 mg/dL 180(H) 129(H) 232(H)  BUN 8 - 23 mg/dL  65(H) 19 43(H)  Creatinine 0.44 - 1.00 mg/dL 7.13(H) 3.20(H) 6.09(H)  Sodium 135 - 145 mmol/L  133(L) 132(L) 134(L)  Potassium 3.5 - 5.1 mmol/L 4.2 3.5 4.5  Chloride 98 - 111 mmol/L 96(L) 93(L) 96(L)  CO2 22 - 32 mmol/L 22 24 23   Calcium 8.9 - 10.3 mg/dL 9.3 9.0 9.3  Total Protein 6.5 - 8.1 g/dL - - -  Total Bilirubin 0.3 - 1.2 mg/dL - - -  Alkaline Phos 38 - 126 U/L - - -  AST 15 - 41 U/L - - -  ALT 0 - 44 U/L - - -    Renal function CrCl cannot be calculated (Patient's most recent lab result is older than the maximum 21 days allowed.).  Hgb A1c MFr Bld (%)  Date Value  07/13/2020 7.9 (H)    LDL Cholesterol  Date Value Ref Range Status  07/14/2020 32 0 - 99 mg/dL Final    Comment:           Total Cholesterol/HDL:CHD Risk Coronary Heart Disease Risk Table                     Men   Women  1/2 Average Risk   3.4   3.3  Average Risk       5.0   4.4  2 X Average Risk   9.6   7.1  3 X Average Risk  23.4   11.0        Use the calculated Patient Ratio above and the CHD Risk Table to determine the patient's CHD Risk.        ATP III CLASSIFICATION (LDL):  <100     mg/dL   Optimal  100-129  mg/dL   Near or Above                    Optimal  130-159  mg/dL   Borderline  160-189  mg/dL   High  >190     mg/dL   Very High Performed at [facility] [address], [place], [place] [address]      Vascular Imaging: *** I personally reviewed *** the above study. It is significant for ***.   Yevonne Aline. Stanford Breed, MD [facility] Phone Number: [phone] 11/24/2020 12:17 PM

## 2020-11-25 ENCOUNTER — Ambulatory Visit (HOSPITAL_COMMUNITY): Payer: Medicare HMO

## 2020-11-25 ENCOUNTER — Encounter: Payer: Medicare HMO | Admitting: Vascular Surgery

## 2020-11-25 DIAGNOSIS — D649 Anemia, unspecified: Secondary | ICD-10-CM | POA: Diagnosis not present

## 2020-11-26 DIAGNOSIS — N186 End stage renal disease: Secondary | ICD-10-CM | POA: Diagnosis not present

## 2020-11-26 DIAGNOSIS — N2581 Secondary hyperparathyroidism of renal origin: Secondary | ICD-10-CM | POA: Diagnosis not present

## 2020-11-26 DIAGNOSIS — Z992 Dependence on renal dialysis: Secondary | ICD-10-CM | POA: Diagnosis not present

## 2020-11-28 ENCOUNTER — Emergency Department (HOSPITAL_COMMUNITY): Payer: Medicare HMO

## 2020-11-28 ENCOUNTER — Other Ambulatory Visit: Payer: Self-pay

## 2020-11-28 ENCOUNTER — Emergency Department (HOSPITAL_COMMUNITY)
Admission: EM | Admit: 2020-11-28 | Discharge: 2020-11-28 | Disposition: A | Discharge status: Home / Self Care | Payer: Medicare HMO | Attending: Emergency Medicine | Admitting: Emergency Medicine

## 2020-11-28 DIAGNOSIS — E1122 Type 2 diabetes mellitus with diabetic chronic kidney disease: Secondary | ICD-10-CM | POA: Diagnosis not present

## 2020-11-28 DIAGNOSIS — Z7984 Long term (current) use of oral hypoglycemic drugs: Secondary | ICD-10-CM | POA: Insufficient documentation

## 2020-11-28 DIAGNOSIS — Z8616 Personal history of COVID-19: Secondary | ICD-10-CM | POA: Diagnosis not present

## 2020-11-28 DIAGNOSIS — Z794 Long term (current) use of insulin: Secondary | ICD-10-CM | POA: Diagnosis not present

## 2020-11-28 DIAGNOSIS — Z992 Dependence on renal dialysis: Secondary | ICD-10-CM | POA: Diagnosis not present

## 2020-11-28 DIAGNOSIS — Z853 Personal history of malignant neoplasm of breast: Secondary | ICD-10-CM | POA: Diagnosis not present

## 2020-11-28 DIAGNOSIS — N2581 Secondary hyperparathyroidism of renal origin: Secondary | ICD-10-CM | POA: Diagnosis not present

## 2020-11-28 DIAGNOSIS — Z7901 Long term (current) use of anticoagulants: Secondary | ICD-10-CM | POA: Diagnosis not present

## 2020-11-28 DIAGNOSIS — Z79899 Other long term (current) drug therapy: Secondary | ICD-10-CM | POA: Diagnosis not present

## 2020-11-28 DIAGNOSIS — K8020 Calculus of gallbladder without cholecystitis without obstruction: Secondary | ICD-10-CM | POA: Diagnosis not present

## 2020-11-28 DIAGNOSIS — I120 Hypertensive chronic kidney disease with stage 5 chronic kidney disease or end stage renal disease: Secondary | ICD-10-CM | POA: Diagnosis not present

## 2020-11-28 DIAGNOSIS — N186 End stage renal disease: Secondary | ICD-10-CM | POA: Insufficient documentation

## 2020-11-28 DIAGNOSIS — R109 Unspecified abdominal pain: Secondary | ICD-10-CM | POA: Diagnosis present

## 2020-11-28 DIAGNOSIS — I4891 Unspecified atrial fibrillation: Secondary | ICD-10-CM | POA: Diagnosis not present

## 2020-11-28 DIAGNOSIS — I700 Atherosclerosis of aorta: Secondary | ICD-10-CM | POA: Diagnosis not present

## 2020-11-28 DIAGNOSIS — K828 Other specified diseases of gallbladder: Secondary | ICD-10-CM | POA: Diagnosis not present

## 2020-11-28 DIAGNOSIS — I10 Essential (primary) hypertension: Secondary | ICD-10-CM | POA: Diagnosis not present

## 2020-11-28 DIAGNOSIS — M47816 Spondylosis without myelopathy or radiculopathy, lumbar region: Secondary | ICD-10-CM | POA: Diagnosis not present

## 2020-11-28 LAB — CBC WITH DIFFERENTIAL/PLATELET
Abs Immature Granulocytes: 0.04 10*3/uL (ref 0.00–0.07)
Basophils Absolute: 0 10*3/uL (ref 0.0–0.1)
Basophils Relative: 1 %
Eosinophils Absolute: 0.1 10*3/uL (ref 0.0–0.5)
Eosinophils Relative: 2 %
HCT: 28.9 % — ABNORMAL LOW (ref 36.0–46.0)
Hemoglobin: 8.7 g/dL — ABNORMAL LOW (ref 12.0–15.0)
Immature Granulocytes: 1 %
Lymphocytes Relative: 12 %
Lymphs Abs: 0.8 10*3/uL (ref 0.7–4.0)
MCH: 28.7 pg (ref 26.0–34.0)
MCHC: 30.1 g/dL (ref 30.0–36.0)
MCV: 95.4 fL (ref 80.0–100.0)
Monocytes Absolute: 0.7 10*3/uL (ref 0.1–1.0)
Monocytes Relative: 10 %
Neutro Abs: 5.2 10*3/uL (ref 1.7–7.7)
Neutrophils Relative %: 74 %
Platelets: 287 10*3/uL (ref 150–400)
RBC: 3.03 MIL/uL — ABNORMAL LOW (ref 3.87–5.11)
RDW: 17.2 % — ABNORMAL HIGH (ref 11.5–15.5)
WBC: 7 10*3/uL (ref 4.0–10.5)
nRBC: 0 % (ref 0.0–0.2)

## 2020-11-28 LAB — BASIC METABOLIC PANEL
Anion gap: 11 (ref 5–15)
BUN: 16 mg/dL (ref 8–23)
CO2: 31 mmol/L (ref 22–32)
Calcium: 8.8 mg/dL — ABNORMAL LOW (ref 8.9–10.3)
Chloride: 95 mmol/L — ABNORMAL LOW (ref 98–111)
Creatinine, Ser: 2.31 mg/dL — ABNORMAL HIGH (ref 0.44–1.00)
GFR, Estimated: 21 mL/min — ABNORMAL LOW (ref >60)
Glucose, Bld: 137 mg/dL — ABNORMAL HIGH (ref 70–99)
Potassium: 3 mmol/L — ABNORMAL LOW (ref 3.5–5.1)
Sodium: 137 mmol/L (ref 135–145)

## 2020-11-28 LAB — TYPE AND SCREEN
ABO/RH(D): O POS
Antibody Screen: NEGATIVE

## 2020-11-28 LAB — APTT: aPTT: 43 seconds — ABNORMAL HIGH (ref 24–36)

## 2020-11-28 LAB — PROTIME-INR
INR: 1.5 — ABNORMAL HIGH (ref 0.8–1.2)
Prothrombin Time: 18.3 seconds — ABNORMAL HIGH (ref 11.4–15.2)

## 2020-11-28 NOTE — Discharge Instructions (Addendum)
As discussed, your evaluation today has been largely reassuring.  But, it is important that you monitor your condition carefully, and do not hesitate to return to the ED if you develop new, or concerning changes in your condition. ? ?Otherwise, please follow-up with your physician for appropriate ongoing care. ? ?

## 2020-11-28 NOTE — ED Provider Notes (Signed)
[facility] EMERGENCY DEPARTMENT Provider Note   CSN: [id number] Arrival date & time: 11/28/20  1754     History Chief Complaint  Patient presents with  . Abnormal Lab    [name] is a 80 y.o. female.  HPI Patient presents with her daughter who assists with the history. She presents with concern for weakness, abdominal pain, possible anemia. Notably, the patient's medical history includes end-stage renal disease, she had dialysis today.  Patient is also on Xarelto with history of A. fib.  She has required transfusions several times in the past month, and has had Hemoccult positive stool with her physician within the past 2 weeks.  She receives her medical care in [facility]. 3 days ago the patient had a blood transfusion after she was found to be anemic, with a hemoglobin of 6.3.  She notes that since that time she has had persistent weakness, and has had increasing abdominal girth.  Yesterday her abdomen was hard, sore, but it has improved somewhat today. No new syncope, near syncope.  No gross blood in stool, no nausea, no vomiting.  She continues to take all medication including her anticoagulant, as above.     Today, after dialysis she was informed of abnormal hemoglobin value and sent here for evaluation.  Past Medical History:  Diagnosis Date  . Cancer ([place])   . Diabetes mellitus   . Hyperlipidemia   . Hypertension    pt states at times    Patient Active Problem List   Diagnosis Date Noted  . Gastritis and gastroduodenitis   . ESRD on hemodialysis ([place])   . Upper GI bleed   . COVID-19 virus infection   . Atrial fibrillation with RVR ([place])   . Hemorrhagic stroke ([place])   . CVA (cerebral vascular accident) ([place]) 07/12/2020  . Fall 07/06/2018  . Left temporal lobe infarction ([place]) 09/05/2017  . TIA (transient ischemic attack) 09/04/2017  . CKD (chronic kidney disease) stage 3, GFR 30-59 ml/min (HCC) 09/03/2017  . Essential hypertension  09/03/2017  . DM type 2 (diabetes mellitus, type 2) ([place]) 09/03/2017  . Lobular carcinoma of right breast ([place]) 03/01/2011    Past Surgical History:  Procedure Laterality Date  . BIOPSY  07/17/2020   Procedure: BIOPSY;  Surgeon: Yetta Flock, MD;  Location: [facility] ENDOSCOPY;  Service: Gastroenterology;;  . BREAST LUMPECTOMY  03/26/2011   right  . ESOPHAGOGASTRODUODENOSCOPY (EGD) WITH PROPOFOL N/A 07/17/2020   Procedure: ESOPHAGOGASTRODUODENOSCOPY (EGD) WITH PROPOFOL;  Surgeon: Yetta Flock, MD;  Location: [place];  Service: Gastroenterology;  Laterality: N/A;     OB History   No obstetric history on file.     Family History  Problem Relation Age of Onset  . Cancer Father        bronchial  . Cancer Paternal Aunt        breast  . Cancer Paternal Aunt        breast    Social History   Tobacco Use  . Smoking status: Never Smoker  . Smokeless tobacco: Never Used  Vaping Use  . Vaping Use: Never used  Substance Use Topics  . Alcohol use: No  . Drug use: No    Home Medications Prior to Admission medications   Medication Sig Start Date End Date Taking? Authorizing Provider  amiodarone (PACERONE) 200 MG tablet Take 1 tablet (200 mg total) by mouth daily. 07/22/20   Thurnell Lose, MD  apixaban (ELIQUIS) 2.5 MG TABS tablet Take  1 tablet (2.5 mg total) by mouth 2 (two) times daily. 09/04/20   [name], MD  ascorbic acid (VITAMIN C) 500 MG tablet Take 500 mg by mouth 2 (two) times daily.    [provider]  atorvastatin (LIPITOR) 20 MG tablet Take 20 mg by mouth at bedtime.    [provider]  Calcium Carbonate (CALTRATE 600 PO) Take 2 tablets by mouth daily.    [provider]  Cholecalciferol (VITAMIN D) 2000 UNITS tablet Take 2,000 Units by mouth daily.    [provider]  diltiazem (CARDIZEM CD) 120 MG 24 hr capsule Take 1 capsule (120 mg total) by mouth daily. 07/22/20   Thurnell Lose, MD  glipiZIDE  (GLUCOTROL XL) 2.5 MG 24 hr tablet Take 5 mg by mouth daily. 03/13/20   [provider]  hydrALAZINE (APRESOLINE) 25 MG tablet Take 1 tablet (25 mg total) by mouth 2 (two) times daily. Patient taking differently: Take 25 mg by mouth 2 (two) times daily. 2 tablets twice daily 04/20/19   Nicholas Lose, MD  Insulin Degludec-Liraglutide (XULTOPHY) 100-3.6 UNIT-MG/ML SOPN Inject 26 Units into the skin every morning.    [provider]  letrozole (FEMARA) 2.5 MG tablet Take 1 tablet (2.5 mg total) by mouth daily. Patient taking differently: Take 2.5 mg by mouth at bedtime. 04/21/20   Nicholas Lose, MD  pantoprazole (PROTONIX) 40 MG tablet Take 1 tablet (40 mg total) by mouth 2 (two) times daily. 07/22/20   Thurnell Lose, MD  sevelamer carbonate (RENVELA) 800 MG tablet Take 800 mg by mouth 3 (three) times daily with meals. 05/20/20   [provider]  zinc sulfate 220 (50 Zn) MG capsule Take 220 mg by mouth 2 (two) times daily.    [provider]    Allergies    Patient has no known allergies.  Review of Systems   Review of Systems  Constitutional:       Per HPI, otherwise negative  HENT:       Per HPI, otherwise negative  Respiratory:       Per HPI, otherwise negative  Cardiovascular:       Per HPI, otherwise negative  Gastrointestinal: Positive for abdominal pain and blood in stool. Negative for vomiting.  Endocrine:       Negative aside from HPI  Genitourinary:       Neg aside from HPI   Musculoskeletal:       Per HPI, otherwise negative  Skin: Negative.   Neurological: Negative for syncope.    Physical Exam Updated Vital Signs BP (!) 157/83   Pulse 79   Temp 98.2 F (36.8 C) (Oral)   Resp 18   SpO2 95%   Physical Exam Vitals and nursing note reviewed.  Constitutional:      General: She is not in acute distress.    Appearance: She is well-developed.  HENT:     Head: Normocephalic and atraumatic.  Eyes:     Conjunctiva/sclera:  Conjunctivae normal.  Cardiovascular:     Rate and Rhythm: Normal rate and regular rhythm.  Pulmonary:     Effort: Pulmonary effort is normal. No respiratory distress.     Breath sounds: Normal breath sounds. No stridor.  Abdominal:     General: There is distension.     Tenderness: There is no abdominal tenderness. There is no guarding.  Skin:    General: Skin is warm and dry.  Neurological:     Mental Status: She  is alert and oriented to person, place, and time.     Cranial Nerves: No cranial nerve deficit.     ED Results / Procedures / Treatments   Labs (all labs ordered are listed, but only abnormal results are displayed) Labs Reviewed  BASIC METABOLIC PANEL - Abnormal; Notable for the following components:      Result Value   Potassium 3.0 (*)    Chloride 95 (*)    Glucose, Bld 137 (*)    Creatinine, Ser 2.31 (*)    Calcium 8.8 (*)    GFR, Estimated 21 (*)    All other components within normal limits  CBC WITH DIFFERENTIAL/PLATELET - Abnormal; Notable for the following components:   RBC 3.03 (*)    Hemoglobin 8.7 (*)    HCT 28.9 (*)    RDW 17.2 (*)    All other components within normal limits  PROTIME-INR - Abnormal; Notable for the following components:   Prothrombin Time 18.3 (*)    INR 1.5 (*)    All other components within normal limits  APTT - Abnormal; Notable for the following components:   aPTT 43 (*)    All other components within normal limits  POC OCCULT BLOOD, ED  TYPE AND SCREEN    EKG EKG Interpretation  Date/Time:  Friday Nov 28 2020 18:06:29 EDT Ventricular Rate:  85 PR Interval:  214 QRS Duration: 102 QT Interval:  436 QTC Calculation: 518 R Axis:   -34 Text Interpretation: Sinus rhythm with 1st degree A-V block Left axis deviation Inferior infarct , age undetermined Anterior infarct , age undetermined Prolonged QT Abnormal ECG Confirmed by [name] [phone]) on 11/28/2020 7:09:52 PM   Radiology CT ABDOMEN PELVIS WO  CONTRAST  Result Date: 11/28/2020 CLINICAL DATA:  Acute abdominal pain and decreased hemoglobin EXAM: CT ABDOMEN AND PELVIS WITHOUT CONTRAST TECHNIQUE: Multidetector CT imaging of the abdomen and pelvis was performed following the standard protocol without IV contrast. COMPARISON:  None. FINDINGS: Lower chest: Lung bases demonstrate some mild scarring without focal infiltrate. Cardiac shadow is enlarged. Hepatobiliary: Gallbladder is well distended with mild diffuse increased density likely related to sludge. Some gas containing gallstones are seen. No pericholecystic fluid or inflammatory changes are noted. Liver is unremarkable. Pancreas: Unremarkable. No pancreatic ductal dilatation or surrounding inflammatory changes. Spleen: Normal in size without focal abnormality. Adrenals/Urinary Tract: Adrenal glands are within normal limits. Kidneys are well visualized bilaterally. No renal calculi or obstructive changes are seen. The bladder is decompressed. Stomach/Bowel: No obstructive or inflammatory changes of the colon are seen. The appendix is not well visualized although no inflammatory changes to suggest appendicitis are noted. Small bowel and stomach appear within normal limits. Vascular/Lymphatic: Aortic atherosclerosis. No enlarged abdominal or pelvic lymph nodes. Reproductive: Uterus and bilateral adnexa are unremarkable. Other: No abdominal wall hernia or abnormality. No abdominopelvic ascites. Musculoskeletal: Bilateral hip replacements are noted. No acute bony abnormality is seen. Degenerative changes of lumbar spine are seen. IMPRESSION: Gallbladder well distended with sludge and gallstones. No inflammatory changes are noted. Chronic changes as described above without acute abnormality to correspond with the patient's given clinical history. Electronically Signed   By: Inez Catalina M.D.   On: 11/28/2020 20:23    Procedures Procedures   Medications Ordered in ED Medications - No data to  display  ED Course  I have reviewed the triage vital signs and the nursing notes.  Pertinent labs & imaging results that were available during my care of the patient were reviewed  by me and considered in my medical decision making (see chart for details).   While performing the initial interview with the patient, I reviewed her labs, in the room.  At bedside, initial labs discussed, notable for hemoglobin 8.7.  On reviewing the patient's timeline, her blood draw which was designated as abnormal with a hemoglobin of 6.2 was performed soon after she had her transfusions, there may have been no time for her body to equilibrate.   MDM Rules/Calculators/A&P                          8:56 PM Patient in no distress, awake, alert, smiling.  She remains hemodynamically unremarkable.  We discussed today's evaluation I have reviewed her CT scan, labs, chart. CT with gallstones, but no acute cholecystitis, no other acute abdominal processes.  With no hypotension, no fever, low suspicion for occult acute abdomen, no evidence for symptomatic anemia given her reassuring hemoglobin value here, patient amenable to, appropriate for discharge with outpatient follow-up.  Given her age, comorbidities, and new finding of gallstones we also discussed return precautions explicitly. Final Clinical Impression(s) / ED Diagnoses Final diagnoses:  Gallstones     [name], MD 11/28/20 2059

## 2020-11-28 NOTE — ED Notes (Signed)
Patient transported to CT 

## 2020-11-28 NOTE — ED Provider Notes (Signed)
Emergency Medicine Provider Triage Evaluation Note  [name] , a 79 y.o. female  was evaluated in triage.  Pt complains of low hemoglobin.  Patient sent from dialysis where she completed her treatment, they checked her hemoglobin and it was 6.2 despite receiving 2 units of blood 3 days ago.  Patient has been dealing with GI bleeding since December and is followed by Dr. Havery Moros with GI.  She has not been seeing bright red blood or melena on exam and they have not been able to determine a clear source for her bleeding.  She is on Eliquis.  Reports some shortness of breath with exertion, no lightheadedness or syncope.  Review of Systems  Positive: Fatigue, shortness of breath, chest pain, abdominal pain Negative: Bright red blood, melena, syncope  Physical Exam  BP (!) 168/80 (BP Location: Left Arm)   Pulse 84   Temp 98.2 F (36.8 C) (Oral)   Resp 18   SpO2 99%  Gen:   Awake, no distress   Resp:  Normal effort  MSK:   Moves extremities without difficulty  O  Medical Decision Making  Medically screening exam initiated at 5:57 PM.  Appropriate orders placed.  [name] was informed that the remainder of the evaluation will be completed by another provider, this initial triage assessment does not replace that evaluation, and the importance of remaining in the ED until their evaluation is complete.  Recheck hemoglobin, but patient will likely need blood transfusion need appointment for further   [name] 11/28/20 1807    Karen Muskrat, MD 11/28/20 1818

## 2020-11-28 NOTE — ED Triage Notes (Signed)
Pt from home for further eval of hemoglobin of 6.2, measured today at dialysis, where she received her full treatment. Received two units of blood 3 days ago. Denies obvious source of bleeding. Pt on Eliqus. Endorses shob with exertion.

## 2020-12-01 DIAGNOSIS — N186 End stage renal disease: Secondary | ICD-10-CM | POA: Diagnosis not present

## 2020-12-01 DIAGNOSIS — N2581 Secondary hyperparathyroidism of renal origin: Secondary | ICD-10-CM | POA: Diagnosis not present

## 2020-12-01 DIAGNOSIS — Z992 Dependence on renal dialysis: Secondary | ICD-10-CM | POA: Diagnosis not present

## 2020-12-03 DIAGNOSIS — N186 End stage renal disease: Secondary | ICD-10-CM | POA: Diagnosis not present

## 2020-12-03 DIAGNOSIS — Z992 Dependence on renal dialysis: Secondary | ICD-10-CM | POA: Diagnosis not present

## 2020-12-03 DIAGNOSIS — N2581 Secondary hyperparathyroidism of renal origin: Secondary | ICD-10-CM | POA: Diagnosis not present

## 2020-12-05 DIAGNOSIS — N186 End stage renal disease: Secondary | ICD-10-CM | POA: Diagnosis not present

## 2020-12-05 DIAGNOSIS — Z992 Dependence on renal dialysis: Secondary | ICD-10-CM | POA: Diagnosis not present

## 2020-12-05 DIAGNOSIS — N2581 Secondary hyperparathyroidism of renal origin: Secondary | ICD-10-CM | POA: Diagnosis not present

## 2020-12-08 DIAGNOSIS — Z992 Dependence on renal dialysis: Secondary | ICD-10-CM | POA: Diagnosis not present

## 2020-12-08 DIAGNOSIS — N186 End stage renal disease: Secondary | ICD-10-CM | POA: Diagnosis not present

## 2020-12-08 DIAGNOSIS — N2581 Secondary hyperparathyroidism of renal origin: Secondary | ICD-10-CM | POA: Diagnosis not present

## 2020-12-09 ENCOUNTER — Ambulatory Visit: Payer: Medicare HMO | Admitting: Adult Health

## 2020-12-10 DIAGNOSIS — N2581 Secondary hyperparathyroidism of renal origin: Secondary | ICD-10-CM | POA: Diagnosis not present

## 2020-12-10 DIAGNOSIS — Z992 Dependence on renal dialysis: Secondary | ICD-10-CM | POA: Diagnosis not present

## 2020-12-10 DIAGNOSIS — N186 End stage renal disease: Secondary | ICD-10-CM | POA: Diagnosis not present

## 2020-12-12 DIAGNOSIS — N186 End stage renal disease: Secondary | ICD-10-CM | POA: Diagnosis not present

## 2020-12-12 DIAGNOSIS — Z992 Dependence on renal dialysis: Secondary | ICD-10-CM | POA: Diagnosis not present

## 2020-12-12 DIAGNOSIS — N2581 Secondary hyperparathyroidism of renal origin: Secondary | ICD-10-CM | POA: Diagnosis not present

## 2020-12-15 DIAGNOSIS — Z992 Dependence on renal dialysis: Secondary | ICD-10-CM | POA: Diagnosis not present

## 2020-12-15 DIAGNOSIS — N2581 Secondary hyperparathyroidism of renal origin: Secondary | ICD-10-CM | POA: Diagnosis not present

## 2020-12-15 DIAGNOSIS — N186 End stage renal disease: Secondary | ICD-10-CM | POA: Diagnosis not present

## 2020-12-16 DIAGNOSIS — K921 Melena: Secondary | ICD-10-CM | POA: Diagnosis not present

## 2020-12-16 DIAGNOSIS — Z7901 Long term (current) use of anticoagulants: Secondary | ICD-10-CM | POA: Diagnosis not present

## 2020-12-16 DIAGNOSIS — D500 Iron deficiency anemia secondary to blood loss (chronic): Secondary | ICD-10-CM | POA: Diagnosis not present

## 2020-12-16 DIAGNOSIS — R1084 Generalized abdominal pain: Secondary | ICD-10-CM | POA: Diagnosis not present

## 2020-12-16 DIAGNOSIS — R195 Other fecal abnormalities: Secondary | ICD-10-CM | POA: Diagnosis not present

## 2020-12-17 DIAGNOSIS — N2581 Secondary hyperparathyroidism of renal origin: Secondary | ICD-10-CM | POA: Diagnosis not present

## 2020-12-17 DIAGNOSIS — Z992 Dependence on renal dialysis: Secondary | ICD-10-CM | POA: Diagnosis not present

## 2020-12-17 DIAGNOSIS — N186 End stage renal disease: Secondary | ICD-10-CM | POA: Diagnosis not present

## 2020-12-19 DIAGNOSIS — Z992 Dependence on renal dialysis: Secondary | ICD-10-CM | POA: Diagnosis not present

## 2020-12-19 DIAGNOSIS — N186 End stage renal disease: Secondary | ICD-10-CM | POA: Diagnosis not present

## 2020-12-19 DIAGNOSIS — N2581 Secondary hyperparathyroidism of renal origin: Secondary | ICD-10-CM | POA: Diagnosis not present

## 2020-12-22 DIAGNOSIS — N186 End stage renal disease: Secondary | ICD-10-CM | POA: Diagnosis not present

## 2020-12-22 DIAGNOSIS — N2581 Secondary hyperparathyroidism of renal origin: Secondary | ICD-10-CM | POA: Diagnosis not present

## 2020-12-22 DIAGNOSIS — Z992 Dependence on renal dialysis: Secondary | ICD-10-CM | POA: Diagnosis not present

## 2020-12-23 DIAGNOSIS — N186 End stage renal disease: Secondary | ICD-10-CM | POA: Diagnosis not present

## 2020-12-23 DIAGNOSIS — Z992 Dependence on renal dialysis: Secondary | ICD-10-CM | POA: Diagnosis not present

## 2020-12-23 DIAGNOSIS — E1122 Type 2 diabetes mellitus with diabetic chronic kidney disease: Secondary | ICD-10-CM | POA: Diagnosis not present

## 2020-12-24 DIAGNOSIS — M25561 Pain in right knee: Secondary | ICD-10-CM | POA: Diagnosis not present

## 2020-12-24 DIAGNOSIS — M1711 Unilateral primary osteoarthritis, right knee: Secondary | ICD-10-CM | POA: Diagnosis not present

## 2020-12-24 DIAGNOSIS — Z992 Dependence on renal dialysis: Secondary | ICD-10-CM | POA: Diagnosis not present

## 2020-12-24 DIAGNOSIS — N2581 Secondary hyperparathyroidism of renal origin: Secondary | ICD-10-CM | POA: Diagnosis not present

## 2020-12-24 DIAGNOSIS — N186 End stage renal disease: Secondary | ICD-10-CM | POA: Diagnosis not present

## 2020-12-26 DIAGNOSIS — Z992 Dependence on renal dialysis: Secondary | ICD-10-CM | POA: Diagnosis not present

## 2020-12-26 DIAGNOSIS — N186 End stage renal disease: Secondary | ICD-10-CM | POA: Diagnosis not present

## 2020-12-26 DIAGNOSIS — N2581 Secondary hyperparathyroidism of renal origin: Secondary | ICD-10-CM | POA: Diagnosis not present

## 2020-12-29 DIAGNOSIS — N186 End stage renal disease: Secondary | ICD-10-CM | POA: Diagnosis not present

## 2020-12-29 DIAGNOSIS — N2581 Secondary hyperparathyroidism of renal origin: Secondary | ICD-10-CM | POA: Diagnosis not present

## 2020-12-29 DIAGNOSIS — Z992 Dependence on renal dialysis: Secondary | ICD-10-CM | POA: Diagnosis not present

## 2020-12-29 NOTE — Progress Notes (Signed)
[name]  ASSESSMENT / PLAN: [name] is a 80 y.o. right handed female in need of permanent hemodialysis access. I reviewed options for dialysis in detail with the patient. I counseled the patient that dialysis access requires surveillance and periodic maintenance.  She is hesitant to proceed with any kind of surgical therapy.  I counseled her that she has a higher risk of infection with persistent catheter use.  She is understanding.  She wishes to reevaluate in 3 months.  We will see her then.   CHIEF COMPLAINT: In need of permanent dialysis access  HISTORY OF PRESENT ILLNESS: [name] is a 80 y.o. female referred to clinic for evaluation of permanent dialysis access.  She has end-stage renal disease dialyzing through a right chest tunneled dialysis catheter Monday, Wednesday, and Friday.  The patient has a history of mastectomy and sentinel lymph node biopsy in the right axilla.  He is right-handed.  She has never had any previous access surgery.  She has had multiple medical problems in the last several months and is worried about undergoing a procedure.  Past Medical History:  Diagnosis Date  . Cancer ([place])   . Diabetes mellitus   . Hyperlipidemia   . Hypertension    pt states at times    Past Surgical History:  Procedure Laterality Date  . BIOPSY  07/17/2020   Procedure: BIOPSY;  Surgeon: Yetta Flock, MD;  Location[facility] ENDOSCOPY;  Service: Gastroenterology;;  . BREAST LUMPECTOMY  03/26/2011   right  . ESOPHAGOGASTRODUODENOSCOPY (EGD) WITH PROPOFOL N/A 07/17/2020   Procedure: ESOPHAGOGASTRODUODENOSCOPY (EGD) WITH PROPOFOL;  Surgeon: Yetta Flock, MD;  Location: [place];  Service: Gastroenterology;  Laterality: N/A;    Family History  Problem Relation Age of Onset  . Cancer Father        bronchial  . Cancer Paternal Aunt        breast  . Cancer Paternal Aunt        breast    Social History   Socioeconomic  History  . Marital status: Married    Spouse name: Not on file  . Number of children: Not on file  . Years of education: Not on file  . Highest education level: Not on file  Occupational History  . Not on file  Tobacco Use  . Smoking status: Never Smoker  . Smokeless tobacco: Never Used  Vaping Use  . Vaping Use: Never used  Substance and Sexual Activity  . Alcohol use: No  . Drug use: No  . Sexual activity: Not Currently  Other Topics Concern  . Not on file  Social History Narrative  . Not on file   Social Determinants of Health   Financial Resource Strain: Not on file  Food Insecurity: Not on file  Transportation Needs: Not on file  Physical Activity: Not on file  Stress: Not on file  Social Connections: Not on file  Intimate Partner Violence: Not on file    No Known Allergies  Current Outpatient Medications  Medication Sig Dispense Refill  . amiodarone (PACERONE) 200 MG tablet Take 1 tablet (200 mg total) by mouth daily. 30 tablet 0  . apixaban (ELIQUIS) 2.5 MG TABS tablet Take 1 tablet (2.5 mg total) by mouth 2 (two) times daily. 180 tablet 1  . ascorbic acid (VITAMIN C) 500 MG tablet Take 500 mg by mouth 2 (two) times daily.    Marland Kitchen atorvastatin (LIPITOR) 20 MG tablet Take 20 mg  by mouth at bedtime.    . Calcium Carbonate (CALTRATE 600 PO) Take 2 tablets by mouth daily.    . Cholecalciferol (VITAMIN D) 2000 UNITS tablet Take 2,000 Units by mouth daily.    Marland Kitchen diltiazem (CARDIZEM CD) 120 MG 24 hr capsule Take 1 capsule (120 mg total) by mouth daily. 30 capsule 0  . glipiZIDE (GLUCOTROL XL) 2.5 MG 24 hr tablet Take 5 mg by mouth daily.    . hydrALAZINE (APRESOLINE) 25 MG tablet Take 1 tablet (25 mg total) by mouth 2 (two) times daily. (Patient taking differently: Take 25 mg by mouth 2 (two) times daily. 2 tablets twice daily)    . Insulin Degludec-Liraglutide (XULTOPHY) 100-3.6 UNIT-MG/ML SOPN Inject 26 Units into the skin every morning.    Marland Kitchen letrozole (FEMARA) 2.5 MG  tablet Take 1 tablet (2.5 mg total) by mouth daily. (Patient taking differently: Take 2.5 mg by mouth at bedtime.) 90 tablet 3  . pantoprazole (PROTONIX) 40 MG tablet Take 1 tablet (40 mg total) by mouth 2 (two) times daily. 60 tablet 0  . sevelamer carbonate (RENVELA) 800 MG tablet Take 800 mg by mouth 3 (three) times daily with meals.    . zinc sulfate 220 (50 Zn) MG capsule Take 220 mg by mouth 2 (two) times daily.     No current facility-administered medications for this visit.    REVIEW OF SYSTEMS:  [X]  denotes positive finding, [ ]  denotes negative finding Cardiac  Comments:  Chest pain or chest pressure:    Shortness of breath upon exertion:    Short of breath when lying flat:    Irregular heart rhythm:        Karen    Pain in calf, thigh, or hip brought on by ambulation:    Pain in feet at night that wakes you up from your sleep:     Blood clot in your veins:    Leg swelling:         Pulmonary    Oxygen at home:    Productive cough:     Wheezing:         Neurologic    Sudden weakness in arms or legs:     Sudden numbness in arms or legs:     Sudden onset of difficulty speaking or slurred speech:    Temporary loss of vision in one eye:     Problems with dizziness:         Gastrointestinal    Blood in stool:     Vomited blood:         Genitourinary    Burning when urinating:     Blood in urine:        Psychiatric    Major depression:         Hematologic    Bleeding problems:    Problems with blood clotting too easily:        Skin    Rashes or ulcers:        Constitutional    Fever or chills:      PHYSICAL EXAM  Vitals:   12/30/20 1329  BP: (!) 169/81  Pulse: 87  Resp: 16  SpO2: 99%  Weight: 167 lb (75.8 kg)  Height: 5\' 6"  (1.676 m)    Constitutional: Elderly, chronically ill appearing. No distress. Appears well nourished.  Neurologic: CN intact. no focal findings. In wheelchair. No sensory loss. Psychiatric: Mood and affect symmetric and  appropriate. Eyes: No icterus. No conjunctival pallor. Ears, nose,  throat: mucous membranes moist. Midline trachea.  Cardiac: regular rate and rhythm.  Respiratory: unlabored. Abdominal: soft, non-tender, non-distended.  Peripheral Karen:  2+ radial pulses  R chest TDC Extremity: No edema. No cyanosis. No pallor.  Skin: No gangrene. No ulceration.  Lymphatic: No Stemmer's sign. No palpable lymphadenopathy.  PERTINENT LABORATORY AND RADIOLOGIC DATA  Most recent CBC CBC Latest Ref Rng & Units 11/28/2020 07/30/2020 07/28/2020  WBC 4.0 - 10.5 K/uL 7.0 5.7 5.5  Hemoglobin 12.0 - 15.0 g/dL 8.7(L) 9.6(L) 10.8(L)  Hematocrit 36.0 - 46.0 % 28.9(L) 30.2(L) 33.7(L)  Platelets 150 - 400 K/uL 287 206 198     Most recent CMP CMP Latest Ref Rng & Units 11/28/2020 07/30/2020 07/28/2020  Glucose 70 - 99 mg/dL 137(H) 180(H) 129(H)  BUN 8 - 23 mg/dL 16 65(H) 19  Creatinine 0.44 - 1.00 mg/dL 2.31(H) 7.13(H) 3.20(H)  Sodium 135 - 145 mmol/L 137 133(L) 132(L)  Potassium 3.5 - 5.1 mmol/L 3.0(L) 4.2 3.5  Chloride 98 - 111 mmol/L 95(L) 96(L) 93(L)  CO2 22 - 32 mmol/L 31 22 24   Calcium 8.9 - 10.3 mg/dL 8.8(L) 9.3 9.0  Total Protein 6.5 - 8.1 g/dL - - -  Total Bilirubin 0.3 - 1.2 mg/dL - - -  Alkaline Phos 38 - 126 U/L - - -  AST 15 - 41 U/L - - -  ALT 0 - 44 U/L - - -    Renal function CrCl cannot be calculated (Patient's most recent lab result is older than the maximum 21 days allowed.).  Hgb A1c MFr Bld (%)  Date Value  07/13/2020 7.9 (H)    LDL Cholesterol  Date Value Ref Range Status  07/14/2020 32 0 - 99 mg/dL Final    Comment:           Total Cholesterol/HDL:CHD Risk Coronary Heart Disease Risk Table                     Men   Women  1/2 Average Risk   3.4   3.3  Average Risk       5.0   4.4  2 X Average Risk   9.6   7.1  3 X Average Risk  23.4   11.0        Use the calculated Patient Ratio above and the CHD Risk Table to determine the patient's CHD Risk.        ATP III  CLASSIFICATION (LDL):  <100     mg/dL   Optimal  100-129  mg/dL   Near or Above                    Optimal  130-159  mg/dL   Borderline  160-189  mg/dL   High  >190     mg/dL   Very High Performed at [facility] [address], [place], [place] [address]      [facility]: Upper extremity venous duplex 12/30/2020 No suitable vein for autogenous access.  Yevonne Aline. Stanford Breed, MD [facility] Phone Number: [phone] 12/29/2020 5:56 PM

## 2020-12-30 ENCOUNTER — Ambulatory Visit (INDEPENDENT_AMBULATORY_CARE_PROVIDER_SITE_OTHER): Payer: Medicare HMO | Admitting: Vascular Surgery

## 2020-12-30 ENCOUNTER — Encounter: Payer: Self-pay | Admitting: Vascular Surgery

## 2020-12-30 ENCOUNTER — Ambulatory Visit (HOSPITAL_COMMUNITY)
Admission: RE | Admit: 2020-12-30 | Discharge: 2020-12-30 | Disposition: A | Discharge status: Home / Self Care | Payer: Medicare HMO | Source: Ambulatory Visit | Attending: Vascular Surgery | Admitting: Vascular Surgery

## 2020-12-30 ENCOUNTER — Ambulatory Visit (INDEPENDENT_AMBULATORY_CARE_PROVIDER_SITE_OTHER)
Admission: RE | Admit: 2020-12-30 | Discharge: 2020-12-30 | Disposition: A | Discharge status: Home / Self Care | Payer: Medicare HMO | Source: Ambulatory Visit | Attending: Vascular Surgery | Admitting: Vascular Surgery

## 2020-12-30 ENCOUNTER — Other Ambulatory Visit: Payer: Self-pay

## 2020-12-30 VITALS — BP 169/81 | HR 87 | Resp 16 | Ht 66.0 in | Wt 167.0 lb

## 2020-12-30 DIAGNOSIS — N183 Chronic kidney disease, stage 3 unspecified: Secondary | ICD-10-CM

## 2020-12-30 DIAGNOSIS — Z992 Dependence on renal dialysis: Secondary | ICD-10-CM | POA: Diagnosis not present

## 2020-12-30 DIAGNOSIS — N186 End stage renal disease: Secondary | ICD-10-CM | POA: Diagnosis not present

## 2020-12-31 DIAGNOSIS — N186 End stage renal disease: Secondary | ICD-10-CM | POA: Diagnosis not present

## 2020-12-31 DIAGNOSIS — N2581 Secondary hyperparathyroidism of renal origin: Secondary | ICD-10-CM | POA: Diagnosis not present

## 2020-12-31 DIAGNOSIS — Z992 Dependence on renal dialysis: Secondary | ICD-10-CM | POA: Diagnosis not present

## 2021-01-02 DIAGNOSIS — Z992 Dependence on renal dialysis: Secondary | ICD-10-CM | POA: Diagnosis not present

## 2021-01-02 DIAGNOSIS — N186 End stage renal disease: Secondary | ICD-10-CM | POA: Diagnosis not present

## 2021-01-02 DIAGNOSIS — N2581 Secondary hyperparathyroidism of renal origin: Secondary | ICD-10-CM | POA: Diagnosis not present

## 2021-01-05 DIAGNOSIS — N186 End stage renal disease: Secondary | ICD-10-CM | POA: Diagnosis not present

## 2021-01-05 DIAGNOSIS — N2581 Secondary hyperparathyroidism of renal origin: Secondary | ICD-10-CM | POA: Diagnosis not present

## 2021-01-05 DIAGNOSIS — Z992 Dependence on renal dialysis: Secondary | ICD-10-CM | POA: Diagnosis not present

## 2021-01-06 ENCOUNTER — Ambulatory Visit: Payer: Medicare HMO | Admitting: Adult Health

## 2021-01-07 DIAGNOSIS — Z992 Dependence on renal dialysis: Secondary | ICD-10-CM | POA: Diagnosis not present

## 2021-01-07 DIAGNOSIS — N186 End stage renal disease: Secondary | ICD-10-CM | POA: Diagnosis not present

## 2021-01-07 DIAGNOSIS — N2581 Secondary hyperparathyroidism of renal origin: Secondary | ICD-10-CM | POA: Diagnosis not present

## 2021-01-08 ENCOUNTER — Inpatient Hospital Stay
Admission: AD | Admit: 2021-01-08 | Payer: Medicare HMO | Source: Other Acute Inpatient Hospital | Admitting: Family Medicine

## 2021-01-08 DIAGNOSIS — Z7901 Long term (current) use of anticoagulants: Secondary | ICD-10-CM | POA: Diagnosis not present

## 2021-01-08 DIAGNOSIS — I517 Cardiomegaly: Secondary | ICD-10-CM | POA: Diagnosis not present

## 2021-01-08 DIAGNOSIS — J9811 Atelectasis: Secondary | ICD-10-CM | POA: Diagnosis not present

## 2021-01-08 DIAGNOSIS — M1990 Unspecified osteoarthritis, unspecified site: Secondary | ICD-10-CM | POA: Diagnosis not present

## 2021-01-08 DIAGNOSIS — I120 Hypertensive chronic kidney disease with stage 5 chronic kidney disease or end stage renal disease: Secondary | ICD-10-CM | POA: Diagnosis not present

## 2021-01-08 DIAGNOSIS — R0602 Shortness of breath: Secondary | ICD-10-CM | POA: Diagnosis not present

## 2021-01-08 DIAGNOSIS — Z8673 Personal history of transient ischemic attack (TIA), and cerebral infarction without residual deficits: Secondary | ICD-10-CM | POA: Diagnosis not present

## 2021-01-08 DIAGNOSIS — N186 End stage renal disease: Secondary | ICD-10-CM | POA: Diagnosis not present

## 2021-01-08 DIAGNOSIS — E1122 Type 2 diabetes mellitus with diabetic chronic kidney disease: Secondary | ICD-10-CM | POA: Diagnosis not present

## 2021-01-08 DIAGNOSIS — D631 Anemia in chronic kidney disease: Secondary | ICD-10-CM | POA: Diagnosis not present

## 2021-01-08 DIAGNOSIS — R778 Other specified abnormalities of plasma proteins: Secondary | ICD-10-CM | POA: Diagnosis not present

## 2021-01-08 DIAGNOSIS — J90 Pleural effusion, not elsewhere classified: Secondary | ICD-10-CM | POA: Diagnosis not present

## 2021-01-08 DIAGNOSIS — Z79899 Other long term (current) drug therapy: Secondary | ICD-10-CM | POA: Diagnosis not present

## 2021-01-08 DIAGNOSIS — Z992 Dependence on renal dialysis: Secondary | ICD-10-CM | POA: Diagnosis not present

## 2021-01-09 ENCOUNTER — Emergency Department (HOSPITAL_COMMUNITY): Payer: Medicare HMO

## 2021-01-09 ENCOUNTER — Inpatient Hospital Stay (HOSPITAL_COMMUNITY)
Admission: EM | Admit: 2021-01-09 | Discharge: 2021-01-11 | DRG: 189 | Disposition: A | Discharge status: Home Health | Payer: Medicare HMO | Attending: Family Medicine | Admitting: Family Medicine

## 2021-01-09 ENCOUNTER — Other Ambulatory Visit: Payer: Self-pay

## 2021-01-09 ENCOUNTER — Encounter (HOSPITAL_COMMUNITY): Payer: Self-pay | Admitting: Internal Medicine

## 2021-01-09 DIAGNOSIS — E8770 Fluid overload, unspecified: Secondary | ICD-10-CM | POA: Diagnosis present

## 2021-01-09 DIAGNOSIS — Z803 Family history of malignant neoplasm of breast: Secondary | ICD-10-CM | POA: Diagnosis not present

## 2021-01-09 DIAGNOSIS — J811 Chronic pulmonary edema: Secondary | ICD-10-CM | POA: Diagnosis not present

## 2021-01-09 DIAGNOSIS — D631 Anemia in chronic kidney disease: Secondary | ICD-10-CM | POA: Diagnosis present

## 2021-01-09 DIAGNOSIS — R0600 Dyspnea, unspecified: Secondary | ICD-10-CM | POA: Diagnosis not present

## 2021-01-09 DIAGNOSIS — N186 End stage renal disease: Secondary | ICD-10-CM | POA: Diagnosis present

## 2021-01-09 DIAGNOSIS — E1129 Type 2 diabetes mellitus with other diabetic kidney complication: Secondary | ICD-10-CM | POA: Diagnosis not present

## 2021-01-09 DIAGNOSIS — Z79811 Long term (current) use of aromatase inhibitors: Secondary | ICD-10-CM | POA: Diagnosis not present

## 2021-01-09 DIAGNOSIS — Z79899 Other long term (current) drug therapy: Secondary | ICD-10-CM

## 2021-01-09 DIAGNOSIS — I517 Cardiomegaly: Secondary | ICD-10-CM | POA: Diagnosis not present

## 2021-01-09 DIAGNOSIS — Z801 Family history of malignant neoplasm of trachea, bronchus and lung: Secondary | ICD-10-CM | POA: Diagnosis not present

## 2021-01-09 DIAGNOSIS — M1990 Unspecified osteoarthritis, unspecified site: Secondary | ICD-10-CM | POA: Diagnosis not present

## 2021-01-09 DIAGNOSIS — J9601 Acute respiratory failure with hypoxia: Secondary | ICD-10-CM | POA: Diagnosis present

## 2021-01-09 DIAGNOSIS — N2581 Secondary hyperparathyroidism of renal origin: Secondary | ICD-10-CM | POA: Diagnosis present

## 2021-01-09 DIAGNOSIS — R195 Other fecal abnormalities: Secondary | ICD-10-CM | POA: Diagnosis present

## 2021-01-09 DIAGNOSIS — Z794 Long term (current) use of insulin: Secondary | ICD-10-CM

## 2021-01-09 DIAGNOSIS — I482 Chronic atrial fibrillation, unspecified: Secondary | ICD-10-CM | POA: Diagnosis present

## 2021-01-09 DIAGNOSIS — E8889 Other specified metabolic disorders: Secondary | ICD-10-CM | POA: Diagnosis present

## 2021-01-09 DIAGNOSIS — K5900 Constipation, unspecified: Secondary | ICD-10-CM | POA: Diagnosis present

## 2021-01-09 DIAGNOSIS — E785 Hyperlipidemia, unspecified: Secondary | ICD-10-CM | POA: Diagnosis present

## 2021-01-09 DIAGNOSIS — J9811 Atelectasis: Secondary | ICD-10-CM | POA: Diagnosis not present

## 2021-01-09 DIAGNOSIS — Z992 Dependence on renal dialysis: Secondary | ICD-10-CM | POA: Diagnosis not present

## 2021-01-09 DIAGNOSIS — N19 Unspecified kidney failure: Secondary | ICD-10-CM | POA: Diagnosis not present

## 2021-01-09 DIAGNOSIS — N185 Chronic kidney disease, stage 5: Secondary | ICD-10-CM | POA: Diagnosis not present

## 2021-01-09 DIAGNOSIS — E119 Type 2 diabetes mellitus without complications: Secondary | ICD-10-CM

## 2021-01-09 DIAGNOSIS — R0602 Shortness of breath: Secondary | ICD-10-CM

## 2021-01-09 DIAGNOSIS — J90 Pleural effusion, not elsewhere classified: Secondary | ICD-10-CM | POA: Diagnosis not present

## 2021-01-09 DIAGNOSIS — I10 Essential (primary) hypertension: Secondary | ICD-10-CM | POA: Diagnosis not present

## 2021-01-09 DIAGNOSIS — I120 Hypertensive chronic kidney disease with stage 5 chronic kidney disease or end stage renal disease: Secondary | ICD-10-CM | POA: Diagnosis present

## 2021-01-09 DIAGNOSIS — E1122 Type 2 diabetes mellitus with diabetic chronic kidney disease: Secondary | ICD-10-CM | POA: Diagnosis present

## 2021-01-09 DIAGNOSIS — J810 Acute pulmonary edema: Secondary | ICD-10-CM | POA: Diagnosis present

## 2021-01-09 DIAGNOSIS — J9600 Acute respiratory failure, unspecified whether with hypoxia or hypercapnia: Secondary | ICD-10-CM | POA: Diagnosis not present

## 2021-01-09 DIAGNOSIS — R778 Other specified abnormalities of plasma proteins: Secondary | ICD-10-CM | POA: Diagnosis not present

## 2021-01-09 DIAGNOSIS — Z20822 Contact with and (suspected) exposure to covid-19: Secondary | ICD-10-CM | POA: Diagnosis present

## 2021-01-09 DIAGNOSIS — C50911 Malignant neoplasm of unspecified site of right female breast: Secondary | ICD-10-CM | POA: Diagnosis present

## 2021-01-09 DIAGNOSIS — E8779 Other fluid overload: Secondary | ICD-10-CM

## 2021-01-09 DIAGNOSIS — D649 Anemia, unspecified: Secondary | ICD-10-CM | POA: Diagnosis present

## 2021-01-09 DIAGNOSIS — Z7901 Long term (current) use of anticoagulants: Secondary | ICD-10-CM

## 2021-01-09 DIAGNOSIS — R609 Edema, unspecified: Secondary | ICD-10-CM | POA: Diagnosis not present

## 2021-01-09 DIAGNOSIS — J80 Acute respiratory distress syndrome: Secondary | ICD-10-CM | POA: Diagnosis not present

## 2021-01-09 DIAGNOSIS — D509 Iron deficiency anemia, unspecified: Secondary | ICD-10-CM

## 2021-01-09 DIAGNOSIS — Z7984 Long term (current) use of oral hypoglycemic drugs: Secondary | ICD-10-CM | POA: Diagnosis not present

## 2021-01-09 DIAGNOSIS — N250 Renal osteodystrophy: Secondary | ICD-10-CM | POA: Diagnosis not present

## 2021-01-09 DIAGNOSIS — Z8673 Personal history of transient ischemic attack (TIA), and cerebral infarction without residual deficits: Secondary | ICD-10-CM | POA: Diagnosis not present

## 2021-01-09 LAB — RENAL FUNCTION PANEL
Albumin: 2.1 g/dL — ABNORMAL LOW (ref 3.5–5.0)
Anion gap: 14 (ref 5–15)
BUN: 31 mg/dL — ABNORMAL HIGH (ref 8–23)
CO2: 28 mmol/L (ref 22–32)
Calcium: 8.8 mg/dL — ABNORMAL LOW (ref 8.9–10.3)
Chloride: 92 mmol/L — ABNORMAL LOW (ref 98–111)
Creatinine, Ser: 4.28 mg/dL — ABNORMAL HIGH (ref 0.44–1.00)
GFR, Estimated: 10 mL/min — ABNORMAL LOW (ref >60)
Glucose, Bld: 87 mg/dL (ref 70–99)
Phosphorus: 3.7 mg/dL (ref 2.5–4.6)
Potassium: 3.6 mmol/L (ref 3.5–5.1)
Sodium: 134 mmol/L — ABNORMAL LOW (ref 135–145)

## 2021-01-09 LAB — CBC WITH DIFFERENTIAL/PLATELET
Abs Immature Granulocytes: 0.07 10*3/uL (ref 0.00–0.07)
Basophils Absolute: 0 10*3/uL (ref 0.0–0.1)
Basophils Relative: 0 %
Eosinophils Absolute: 0.1 10*3/uL (ref 0.0–0.5)
Eosinophils Relative: 1 %
HCT: 22.9 % — ABNORMAL LOW (ref 36.0–46.0)
Hemoglobin: 6.9 g/dL — CL (ref 12.0–15.0)
Immature Granulocytes: 1 %
Lymphocytes Relative: 5 %
Lymphs Abs: 0.5 10*3/uL — ABNORMAL LOW (ref 0.7–4.0)
MCH: 25.7 pg — ABNORMAL LOW (ref 26.0–34.0)
MCHC: 30.1 g/dL (ref 30.0–36.0)
MCV: 85.4 fL (ref 80.0–100.0)
Monocytes Absolute: 0.7 10*3/uL (ref 0.1–1.0)
Monocytes Relative: 7 %
Neutro Abs: 8.6 10*3/uL — ABNORMAL HIGH (ref 1.7–7.7)
Neutrophils Relative %: 86 %
Platelets: 432 10*3/uL — ABNORMAL HIGH (ref 150–400)
RBC: 2.68 MIL/uL — ABNORMAL LOW (ref 3.87–5.11)
RDW: 19.4 % — ABNORMAL HIGH (ref 11.5–15.5)
WBC: 10.1 10*3/uL (ref 4.0–10.5)
nRBC: 0 % (ref 0.0–0.2)

## 2021-01-09 LAB — GLUCOSE, CAPILLARY
Glucose-Capillary: 115 mg/dL — ABNORMAL HIGH (ref 70–99)
Glucose-Capillary: 179 mg/dL — ABNORMAL HIGH (ref 70–99)

## 2021-01-09 LAB — PREPARE RBC (CROSSMATCH)

## 2021-01-09 LAB — SARS CORONAVIRUS 2 (TAT 6-24 HRS): SARS Coronavirus 2: NEGATIVE

## 2021-01-09 MED ORDER — SODIUM CHLORIDE 0.9% IV SOLUTION: Freq: Once | INTRAVENOUS | Status: DC

## 2021-01-09 MED ORDER — CHLORHEXIDINE GLUCONATE CLOTH 2 % EX PADS: 6.0000 | MEDICATED_PAD | Freq: Every day | CUTANEOUS | Status: DC

## 2021-01-09 MED ORDER — CALCIUM CARBONATE 1250 (500 CA) MG PO TABS
1.0000 | ORAL_TABLET | Freq: Two times a day (BID) | ORAL | Status: DC
  Administered 2021-01-09 – 2021-01-11 (×4): 500 mg via ORAL
  Filled 2021-01-09 (×5): qty 1

## 2021-01-09 MED ORDER — HYDROCODONE-ACETAMINOPHEN 5-325 MG PO TABS: 1.0000 | ORAL_TABLET | ORAL | Status: DC | PRN

## 2021-01-09 MED ORDER — APIXABAN 2.5 MG PO TABS
2.5000 mg | ORAL_TABLET | Freq: Two times a day (BID) | ORAL | Status: DC
  Administered 2021-01-09 – 2021-01-11 (×5): 2.5 mg via ORAL
  Filled 2021-01-09 (×6): qty 1

## 2021-01-09 MED ORDER — POLYETHYLENE GLYCOL 3350 17 G PO PACK
17.0000 g | PACK | Freq: Every day | ORAL | Status: DC | PRN
  Administered 2021-01-09: 17 g via ORAL
  Filled 2021-01-09: qty 1

## 2021-01-09 MED ORDER — HYDRALAZINE HCL 25 MG PO TABS: 25.0000 mg | ORAL_TABLET | Freq: Two times a day (BID) | ORAL | Status: DC

## 2021-01-09 MED ORDER — INSULIN DEGLUDEC-LIRAGLUTIDE 100-3.6 UNIT-MG/ML ~~LOC~~ SOPN
26.0000 [IU] | PEN_INJECTOR | Freq: Every morning | SUBCUTANEOUS | Status: DC
  Administered 2021-01-11: 26 [IU] via SUBCUTANEOUS
  Filled 2021-01-09: qty 3

## 2021-01-09 MED ORDER — ATORVASTATIN CALCIUM 10 MG PO TABS
20.0000 mg | ORAL_TABLET | Freq: Every day | ORAL | Status: DC
  Administered 2021-01-09 – 2021-01-10 (×2): 20 mg via ORAL
  Filled 2021-01-09 (×2): qty 2

## 2021-01-09 MED ORDER — ACETAMINOPHEN 650 MG RE SUPP: 650.0000 mg | Freq: Four times a day (QID) | RECTAL | Status: DC | PRN

## 2021-01-09 MED ORDER — ACETAMINOPHEN 325 MG PO TABS: 650.0000 mg | ORAL_TABLET | Freq: Four times a day (QID) | ORAL | Status: DC | PRN

## 2021-01-09 MED ORDER — PROSOURCE PLUS PO LIQD
30.0000 mL | Freq: Two times a day (BID) | ORAL | Status: DC
  Administered 2021-01-10 – 2021-01-11 (×3): 30 mL via ORAL
  Filled 2021-01-09 (×4): qty 30

## 2021-01-09 MED ORDER — VITAMIN D 25 MCG (1000 UNIT) PO TABS
2000.0000 [IU] | ORAL_TABLET | Freq: Every day | ORAL | Status: DC
  Administered 2021-01-09 – 2021-01-11 (×3): 2000 [IU] via ORAL
  Filled 2021-01-09 (×3): qty 2

## 2021-01-09 MED ORDER — ONDANSETRON HCL 4 MG/2ML IJ SOLN: 4.0000 mg | Freq: Four times a day (QID) | INTRAMUSCULAR | Status: DC | PRN

## 2021-01-09 MED ORDER — PANTOPRAZOLE SODIUM 40 MG PO TBEC
40.0000 mg | DELAYED_RELEASE_TABLET | Freq: Two times a day (BID) | ORAL | Status: DC
  Administered 2021-01-09 – 2021-01-11 (×5): 40 mg via ORAL
  Filled 2021-01-09 (×5): qty 1

## 2021-01-09 MED ORDER — ALBUTEROL SULFATE (2.5 MG/3ML) 0.083% IN NEBU: 2.5000 mg | INHALATION_SOLUTION | RESPIRATORY_TRACT | Status: DC | PRN

## 2021-01-09 MED ORDER — DILTIAZEM HCL ER COATED BEADS 120 MG PO CP24
120.0000 mg | ORAL_CAPSULE | Freq: Every day | ORAL | Status: DC
  Administered 2021-01-09 – 2021-01-11 (×3): 120 mg via ORAL
  Filled 2021-01-09 (×3): qty 1

## 2021-01-09 MED ORDER — AMIODARONE HCL 200 MG PO TABS
200.0000 mg | ORAL_TABLET | Freq: Every day | ORAL | Status: DC
  Administered 2021-01-09 – 2021-01-11 (×3): 200 mg via ORAL
  Filled 2021-01-09 (×3): qty 1

## 2021-01-09 MED ORDER — ASCORBIC ACID 500 MG PO TABS
500.0000 mg | ORAL_TABLET | Freq: Two times a day (BID) | ORAL | Status: DC
  Administered 2021-01-09 – 2021-01-11 (×5): 500 mg via ORAL
  Filled 2021-01-09 (×5): qty 1

## 2021-01-09 MED ORDER — SEVELAMER CARBONATE 800 MG PO TABS
800.0000 mg | ORAL_TABLET | Freq: Three times a day (TID) | ORAL | Status: DC
  Administered 2021-01-09 – 2021-01-11 (×7): 800 mg via ORAL
  Filled 2021-01-09 (×7): qty 1

## 2021-01-09 MED ORDER — ONDANSETRON HCL 4 MG PO TABS: 4.0000 mg | ORAL_TABLET | Freq: Four times a day (QID) | ORAL | Status: DC | PRN

## 2021-01-09 MED ORDER — HYDRALAZINE HCL 20 MG/ML IJ SOLN: 10.0000 mg | Freq: Four times a day (QID) | INTRAMUSCULAR | Status: DC | PRN

## 2021-01-09 MED ORDER — INSULIN ASPART 100 UNIT/ML IJ SOLN
0.0000 [IU] | Freq: Three times a day (TID) | INTRAMUSCULAR | Status: DC
  Administered 2021-01-10 (×3): 1 [IU] via SUBCUTANEOUS
  Administered 2021-01-11: 2 [IU] via SUBCUTANEOUS

## 2021-01-09 MED ORDER — LETROZOLE 2.5 MG PO TABS
2.5000 mg | ORAL_TABLET | Freq: Every day | ORAL | Status: DC
  Administered 2021-01-09 – 2021-01-11 (×3): 2.5 mg via ORAL
  Filled 2021-01-09 (×3): qty 1

## 2021-01-09 NOTE — Progress Notes (Signed)
Daughter Amy would like to speak with nephrology for an update.and plan of care.  Her number is on file. Pt is ok with you calling daughter.

## 2021-01-09 NOTE — ED Notes (Signed)
Patient states she is feeling much better, states she is breathing better after o2.

## 2021-01-09 NOTE — Progress Notes (Signed)
NEW ADMISSION NOTE New Admission Note:   Arrival Method: stretcher Mental Orientation: A&O X  Telemetry: [id number] Assessment: Completed Skin: intact. Buttocks red and red area Left lateral leg IV: left hand, LAC Pain: 0/10 Tubes: N/A Safety Measures: Safety Fall Prevention Plan has been given, discussed and signed Admission: Completed [facility] Orientation: Patient has been orientated to the room, unit and staff.  Family: daughter at beside  Orders have been reviewed and implemented. Will continue to monitor the patient. Call light has been placed within reach and bed alarm has been activated.   Charlesa Ehle S Giovonnie Trettel, RN

## 2021-01-09 NOTE — Consult Note (Signed)
[facility] Renal Consultation Note    Indication for Consultation:  Management of ESRD/hemodialysis, anemia, hypertension/volume, and secondary hyperparathyroidism. PCP:  HPI: [name] is a 80 y.o. female with ESRD, HTN, HL, T2DM, and Hx prior R breast cancer who was admitted with acute respiratory failure after being transferred from [facility].  She reports gradual worsening in breathing status over the past 3-4 days, esp at night with chest pressure and abdominal "fullness." Had been going to dialysis as usual, had last full treatment on 6/15 and had been meeting her dry weight. She denied any chest pain, fever, chills, N/V or diarrhea. On 6/16, she presented to [facility] with dyspnea requiring 15L non-rebreather mask.   Per notes - labs at [facility] showed WBC 9.6, Hgb 6.9, Plts 535. D-dimer high, trop 1.2. COVID/Flu/RSV negative. CXR here showed pulmonary edema and B pleural effusions. EKG without significant ST changes. Repeat labs this morning at [facility] showed Hgb 6.9, K 3.6.  Dialyzes at the [facility] Dialysis unit on MWF schedule. She is due for dialysis today.  Past Medical History:  Diagnosis Date   Cancer ([place])    Chronic kidney disease    Diabetes mellitus    Hyperlipidemia    Hypertension    pt states at times   Past Surgical History:  Procedure Laterality Date   BIOPSY  07/17/2020   Procedure: BIOPSY;  Surgeon: Yetta Flock, MD;  Location: [facility] ENDOSCOPY;  Service: Gastroenterology;;   BREAST LUMPECTOMY  03/26/2011   right   ESOPHAGOGASTRODUODENOSCOPY (EGD) WITH PROPOFOL N/A 07/17/2020   Procedure: ESOPHAGOGASTRODUODENOSCOPY (EGD) WITH PROPOFOL;  Surgeon: Yetta Flock, MD;  Location: [place];  Service: Gastroenterology;  Laterality: N/A;   Family History  Problem Relation Age of Onset   Cancer Father        bronchial   Cancer Paternal Aunt        breast   Cancer Paternal Aunt        breast   Social History:   reports that she has never smoked. She has never used smokeless tobacco. She reports that she does not drink alcohol and does not use drugs.  ROS: As per HPI otherwise negative.  Physical Exam: Vitals:   01/09/21 0800 01/09/21 0815 01/09/21 0824 01/09/21 1145  BP: (!) 141/69 (!) 143/69 (!) 142/72 (!) 144/74  Pulse: 84 85 89 89  Resp: 17 20 20 20   Temp:   98.9 F (37.2 C) 99.8 F (37.7 C)  TempSrc:   Oral Oral  SpO2: 91% 92% 97% 100%     General: Well developed, well nourished, in no acute distress. Head: Normocephalic, atraumatic, sclera non-icteric, mucus membranes are moist. Neck: Supple without lymphadenopathy/masses. Lungs: Bibasilar crackles 1/2 up; no wheezing Heart: RRR with normal S1, S2. No murmurs, rubs, or gallops appreciated. Abdomen: Soft, non-tender, non-distended with normoactive bowel sounds. No rebound/guarding. No obvious abdominal masses. Musculoskeletal:  Strength and tone appear normal for age. Lower extremities: No edema or ischemic changes, no open wounds. Neuro: Alert and oriented X 3. Moves all extremities spontaneously. Psych:  Responds to questions appropriately with a normal affect. Dialysis Access: TDC in R chest  No Known Allergies Prior to Admission medications   Medication Sig Start Date End Date Taking? Authorizing Provider  amiodarone (PACERONE) 200 MG tablet Take 1 tablet (200 mg total) by mouth daily. 07/22/20  Yes Thurnell Lose, MD  apixaban (ELIQUIS) 2.5 MG TABS tablet Take 1 tablet (2.5 mg total) by mouth 2 (two) times  daily. 09/04/20  Yes [name], MD  ascorbic acid (VITAMIN C) 500 MG tablet Take 500 mg by mouth 2 (two) times daily.   Yes [provider]  atorvastatin (LIPITOR) 20 MG tablet Take 20 mg by mouth at bedtime.   Yes [provider]  Cholecalciferol (VITAMIN D3) 25 MCG (1000 UT) CAPS Take 2,000 Units by mouth daily.   Yes [provider]  diltiazem (CARDIZEM CD) 120 MG 24 hr capsule Take 1  capsule (120 mg total) by mouth daily. 07/22/20  Yes Thurnell Lose, MD  Insulin Degludec-Liraglutide (XULTOPHY) 100-3.6 UNIT-MG/ML SOPN Inject 26 Units into the skin every morning.   Yes [provider]  letrozole (FEMARA) 2.5 MG tablet Take 1 tablet (2.5 mg total) by mouth daily. Patient taking differently: Take 2.5 mg by mouth at bedtime. 04/21/20  Yes Nicholas Lose, MD  linaclotide (LINZESS) 72 MCG capsule Take 72 mcg by mouth at bedtime.   Yes [provider]  pantoprazole (PROTONIX) 40 MG tablet Take 1 tablet (40 mg total) by mouth 2 (two) times daily. 07/22/20  Yes Thurnell Lose, MD  sevelamer carbonate (RENVELA) 800 MG tablet Take 800 mg by mouth daily. 05/20/20  Yes [provider]  zinc sulfate 220 (50 Zn) MG capsule Take 220 mg by mouth 2 (two) times daily.   Yes [provider]  hydrALAZINE (APRESOLINE) 25 MG tablet Take 1 tablet (25 mg total) by mouth 2 (two) times daily. Patient not taking: Reported on 01/09/2021 04/20/19   Nicholas Lose, MD   Current Facility-Administered Medications  Medication Dose Route Frequency Provider Last Rate Last Admin   0.9 %  sodium chloride infusion (Manually program via Guardrails IV Fluids)   Intravenous Once Guilford Shi, MD       acetaminophen (TYLENOL) tablet 650 mg  650 mg Oral Q6H PRN Guilford Shi, MD       Or   acetaminophen (TYLENOL) suppository 650 mg  650 mg Rectal Q6H PRN Guilford Shi, MD       albuterol (PROVENTIL) (2.5 MG/3ML) 0.083% nebulizer solution 2.5 mg  2.5 mg Nebulization Q2H PRN Guilford Shi, MD       amiodarone (PACERONE) tablet 200 mg  200 mg Oral Daily Guilford Shi, MD   200 mg at 01/09/21 1013   apixaban (ELIQUIS) tablet 2.5 mg  2.5 mg Oral BID Guilford Shi, MD   2.5 mg at 01/09/21 1014   ascorbic acid (VITAMIN C) tablet 500 mg  500 mg Oral BID Guilford Shi, MD   500 mg at 01/09/21 1014   atorvastatin (LIPITOR) tablet 20 mg  20 mg Oral QHS Kamineni,  Neelima, MD       calcium carbonate (OS-CAL - dosed in mg of elemental calcium) tablet 500 mg of elemental calcium  1 tablet Oral BID WC Kamineni, Neelima, MD       Chlorhexidine Gluconate Cloth 2 % PADS 6 each  6 each Topical Q0600 Loren Racer, PA-C       cholecalciferol (VITAMIN D3) tablet 2,000 Units  2,000 Units Oral Daily Guilford Shi, MD   2,000 Units at 01/09/21 1013   diltiazem (CARDIZEM CD) 24 hr capsule 120 mg  120 mg Oral Daily Guilford Shi, MD   120 mg at 01/09/21 1014   hydrALAZINE (APRESOLINE) injection 10 mg  10 mg Intravenous Q6H PRN Guilford Shi, MD       HYDROcodone-acetaminophen (NORCO/VICODIN) 5-325 MG per tablet 1-2 tablet  1-2 tablet Oral Q4H PRN Kamineni, Neelima,  MD       insulin aspart (novoLOG) injection 0-6 Units  0-6 Units Subcutaneous TID WC Kamineni, Neelima, MD       Insulin Degludec-Liraglutide (XULTOPHY) 100-3.6 UNIT-MG/ML SOPN 26 Units  26 Units Subcutaneous q morning Guilford Shi, MD       letrozole [facility]) tablet 2.5 mg  2.5 mg Oral Daily Guilford Shi, MD   2.5 mg at 01/09/21 1014   ondansetron (ZOFRAN) tablet 4 mg  4 mg Oral Q6H PRN Guilford Shi, MD       Or   ondansetron (ZOFRAN) injection 4 mg  4 mg Intravenous Q6H PRN Guilford Shi, MD       pantoprazole (PROTONIX) EC tablet 40 mg  40 mg Oral BID Guilford Shi, MD   40 mg at 01/09/21 1013   polyethylene glycol (MIRALAX / GLYCOLAX) packet 17 g  17 g Oral Daily PRN Guilford Shi, MD       sevelamer carbonate (RENVELA) tablet 800 mg  800 mg Oral TID WC Guilford Shi, MD       Current Outpatient Medications  Medication Sig Dispense Refill   amiodarone (PACERONE) 200 MG tablet Take 1 tablet (200 mg total) by mouth daily. 30 tablet 0   apixaban (ELIQUIS) 2.5 MG TABS tablet Take 1 tablet (2.5 mg total) by mouth 2 (two) times daily. 180 tablet 1   ascorbic acid (VITAMIN C) 500 MG tablet Take 500 mg by mouth 2 (two) times daily.     atorvastatin (LIPITOR) 20 MG  tablet Take 20 mg by mouth at bedtime.     Cholecalciferol (VITAMIN D3) 25 MCG (1000 UT) CAPS Take 2,000 Units by mouth daily.     diltiazem (CARDIZEM CD) 120 MG 24 hr capsule Take 1 capsule (120 mg total) by mouth daily. 30 capsule 0   Insulin Degludec-Liraglutide (XULTOPHY) 100-3.6 UNIT-MG/ML SOPN Inject 26 Units into the skin every morning.     letrozole (FEMARA) 2.5 MG tablet Take 1 tablet (2.5 mg total) by mouth daily. (Patient taking differently: Take 2.5 mg by mouth at bedtime.) 90 tablet 3   linaclotide (LINZESS) 72 MCG capsule Take 72 mcg by mouth at bedtime.     pantoprazole (PROTONIX) 40 MG tablet Take 1 tablet (40 mg total) by mouth 2 (two) times daily. 60 tablet 0   sevelamer carbonate (RENVELA) 800 MG tablet Take 800 mg by mouth daily.     zinc sulfate 220 (50 Zn) MG capsule Take 220 mg by mouth 2 (two) times daily.     hydrALAZINE (APRESOLINE) 25 MG tablet Take 1 tablet (25 mg total) by mouth 2 (two) times daily. (Patient not taking: Reported on 01/09/2021)     Labs: Basic Metabolic Panel: Recent Labs  Lab 01/09/21 0829  NA 134*  K 3.6  CL 92*  CO2 28  GLUCOSE 87  BUN 31*  CREATININE 4.28*  CALCIUM 8.8*  PHOS 3.7   Liver Function Tests: Recent Labs  Lab 01/09/21 0829  ALBUMIN 2.1*    CBC: Recent Labs  Lab 01/09/21 0829  WBC 10.1  NEUTROABS 8.6*  HGB 6.9*  HCT 22.9*  MCV 85.4  PLT 432*   Studies/Results: DG CHEST PORT 1 VIEW  Result Date: 01/09/2021 CLINICAL DATA:  Shortness of breath.  Renal failure EXAM: PORTABLE CHEST 1 VIEW COMPARISON:  January 09, 2021 study obtained earlier in the day FINDINGS: Central catheter tip is at the cavoatrial junction. No pneumothorax. There is cardiomegaly with pulmonary venous hypertension. There are pleural effusions bilaterally with  interstitial edema, increased from earlier in the day. There is ill-defined airspace opacity in the lung bases as well. There is aortic atherosclerosis. No adenopathy. No bone lesions.  IMPRESSION: Central catheter as described without pneumothorax. Cardiomegaly with pulmonary vascular congestion. Increased interstitial edema with suspected alveolar edema in the bases and pleural effusions. Appearance overall consistent with congestive heart failure. Electronically Signed   By: Lowella Grip III M.D.   On: 01/09/2021 09:13    Dialysis Orders:  MWF at [facility] 3:30hr, 400/500, EDW 70.5kg, 2K/2Ca, UFP #2, TDC, no heparin - Venofer 100mg  x 10 ordered - Hgb 7.6 with tsat 11% on 12/31/20 - Mircera 281mcg Iv q 2 weeks  - Calcitirol 0.88mcg PO q HD  Assessment/Plan:  Acute Resp Failure/Pulmonary edema: Suspect weight loss as trigger as has been compliant with HD sessions -> UF as tolerated today.  ESRD:  Continue HD per usual MWF schedule - HD today, 3 - 3.5L UFG (as tolerated).  Hypertension/volume: BP slightly high, follow with UF.  Anemia of ESRD: Hgb 7.6 with tsat 11% on 6/8 - lower here. On max ESA as outpatient. Getting blood here - continue IV iron as outpatient.  Metabolic bone disease: Ca/Phos ok - continue home meds.  Nutrition:  Alb low, adding supplements.  Hx R breast cancer: S/p lumpectomy, remains on Letrozole. T2DM A-fib: On amiodarone + Eliquis.  Veneta Penton, PA-C 01/09/2021, 12:55 PM  [facility]

## 2021-01-09 NOTE — ED Triage Notes (Addendum)
Pt ED vto ED transfer from [facility] with c/o of SOB and suspected flash pulmonary edema. Pt reports hemodialysis on M/W/F. States last treatment was Wednesday 01/07/2021. Pt changed from 15L non-rebreather to 4LO2 via nasal cannula by respiratory therapist upon arrival to this ED and appears to be tolerating well with no acute distress noted.

## 2021-01-09 NOTE — Procedures (Signed)
   I was present at this dialysis session, have reviewed the session itself and made  appropriate changes [name] MD [place] pager [phone]   01/09/2021, 5:05 PM

## 2021-01-09 NOTE — ED Notes (Signed)
Report given to [facility] in [facility] Dialysis

## 2021-01-09 NOTE — H&P (Addendum)
History and Physical    DOA: 01/09/2021  PCP: [name], MD  Patient coming from: HOME  Chief Complaint: Dyspnea  HPI: [name] is a 80 y.o. female with history h/o HTN, HLP, DM, ESRD  on HD MWF , A fib on eliquis, Chr GI bleed with Chr anemia previous transfusions, presented to [facility] with SOB, abd bloating, hypoxic-placed on 4 lits 02 , CXR -edema. Hgb 6.8->received 1 unit PRBC (posttransfusion level apparently 7.1)-accepted by Dr Myna Hidalgo for [facility], transferred ED-ED as no beds, nephrology consulted, repeat labs ordered here.  Patient denies any history of melena or hematochezia although reports chronic dark stools as she is on iron supplementation along with related constipation.  She is currently saturating well on 4 L O2 and denies any complaints of chest pain or dyspnea.  She states she and her husband have been in the process of completing advanced directive and that she would not want to be kept alive on machines for too long but would like resuscitative measures to be pursued in an emergent situation.  She is agreeable for repeat transfusion as needed and hemodialysis here    Review of Systems: As per HPI, otherwise review of systems negative.    Past Medical History:  Diagnosis Date   Cancer ([place])    Chronic kidney disease    Diabetes mellitus    Hyperlipidemia    Hypertension    pt states at times    Past Surgical History:  Procedure Laterality Date   BIOPSY  07/17/2020   Procedure: BIOPSY;  Surgeon: Yetta Flock, MD;  Location: [facility] ENDOSCOPY;  Service: Gastroenterology;;   BREAST LUMPECTOMY  03/26/2011   right   ESOPHAGOGASTRODUODENOSCOPY (EGD) WITH PROPOFOL N/A 07/17/2020   Procedure: ESOPHAGOGASTRODUODENOSCOPY (EGD) WITH PROPOFOL;  Surgeon: Yetta Flock, MD;  Location: [place];  Service: Gastroenterology;  Laterality: N/A;    Social history:  reports that she has never smoked. She has never used smokeless tobacco. She reports that she  does not drink alcohol and does not use drugs.   No Known Allergies  Family History  Problem Relation Age of Onset   Cancer Father        bronchial   Cancer Paternal Aunt        breast   Cancer Paternal Aunt        breast      Prior to Admission medications   Medication Sig Start Date End Date Taking? Authorizing Provider  amiodarone (PACERONE) 200 MG tablet Take 1 tablet (200 mg total) by mouth daily. 07/22/20   Thurnell Lose, MD  apixaban (ELIQUIS) 2.5 MG TABS tablet Take 1 tablet (2.5 mg total) by mouth 2 (two) times daily. 09/04/20   [name], MD  ascorbic acid (VITAMIN C) 500 MG tablet Take 500 mg by mouth 2 (two) times daily.    [provider]  atorvastatin (LIPITOR) 20 MG tablet Take 20 mg by mouth at bedtime.    [provider]  Calcium Carbonate (CALTRATE 600 PO) Take 2 tablets by mouth daily.    [provider]  Cholecalciferol (VITAMIN D) 2000 UNITS tablet Take 2,000 Units by mouth daily.    [provider]  diltiazem (CARDIZEM CD) 120 MG 24 hr capsule Take 1 capsule (120 mg total) by mouth daily. 07/22/20   Thurnell Lose, MD  glipiZIDE (GLUCOTROL XL) 2.5 MG 24 hr tablet Take 5 mg by mouth daily. 03/13/20   [provider]  hydrALAZINE (APRESOLINE)  25 MG tablet Take 1 tablet (25 mg total) by mouth 2 (two) times daily. Patient taking differently: Take 25 mg by mouth 2 (two) times daily. 2 tablets twice daily 04/20/19   Nicholas Lose, MD  Insulin Degludec-Liraglutide (XULTOPHY) 100-3.6 UNIT-MG/ML SOPN Inject 26 Units into the skin every morning.    [provider]  letrozole (FEMARA) 2.5 MG tablet Take 1 tablet (2.5 mg total) by mouth daily. Patient taking differently: Take 2.5 mg by mouth at bedtime. 04/21/20   Nicholas Lose, MD  pantoprazole (PROTONIX) 40 MG tablet Take 1 tablet (40 mg total) by mouth 2 (two) times daily. 07/22/20   Thurnell Lose, MD  sevelamer carbonate (RENVELA) 800 MG tablet Take  800 mg by mouth 3 (three) times daily with meals. 05/20/20   [provider]  zinc sulfate 220 (50 Zn) MG capsule Take 220 mg by mouth 2 (two) times daily.    [provider]    Physical Exam: Vitals:   01/09/21 0745 01/09/21 0800 01/09/21 0815 01/09/21 0824  BP: (!) 142/72 (!) 141/69 (!) 143/69 (!) 142/72  Pulse: 88 84 85 89  Resp: 17 17 20 20   Temp:    98.9 F (37.2 C)  TempSrc:    Oral  SpO2: 94% 91% 92% 97%    Constitutional: NAD, calm, comfortable sitting on the stretcher on 4 L nasal cannula Eyes: PERRL, lids and conjunctivae normal ENMT: Mucous membranes are moist. Posterior pharynx clear of any exudate or lesions.Normal dentition.  Neck: normal, supple, no masses, no thyromegaly Respiratory: clear to auscultation bilaterally, no wheezing, no crackles. Normal respiratory effort. No accessory muscle use.  Cardiovascular: Irregular  rhythm, no murmurs / rubs / gallops.  Trace lower extremity edema. 2+ pedal pulses. No carotid bruits.  Abdomen: no tenderness, no masses palpated. No hepatosplenomegaly. Bowel sounds positive.  Musculoskeletal: no clubbing / cyanosis. No joint deformity upper and lower extremities. Good ROM, no contractures. Normal muscle tone.  Neurologic: CN 2-12 grossly intact. Sensation intact, DTR normal. Strength 5/5 in all 4.  Psychiatric: Normal judgment and insight. Alert and oriented x 3. Normal mood.  SKIN/catheters: no rashes, lesions, ulcers. No induration  Labs on Admission: I have personally reviewed following labs and imaging studies  CBC: No results for input(s): WBC, NEUTROABS, HGB, HCT, MCV, PLT in the last 168 hours. Basic Metabolic Panel: No results for input(s): NA, K, CL, CO2, GLUCOSE, BUN, CREATININE, CALCIUM, MG, PHOS in the last 168 hours. GFR: CrCl cannot be calculated (Patient's most recent lab result is older than the maximum 21 days allowed.). No results for input(s): PROCALCITON, WBC, LATICACIDVEN in the last 168  hours. Liver Function Tests: No results for input(s): AST, ALT, ALKPHOS, BILITOT, PROT, ALBUMIN in the last 168 hours. No results for input(s): LIPASE, AMYLASE in the last 168 hours. No results for input(s): AMMONIA in the last 168 hours. Coagulation Profile: No results for input(s): INR, PROTIME in the last 168 hours. Cardiac Enzymes: No results for input(s): CKTOTAL, CKMB, CKMBINDEX, TROPONINI in the last 168 hours. BNP (last 3 results) No results for input(s): PROBNP in the last 8760 hours. HbA1C: No results for input(s): HGBA1C in the last 72 hours. CBG: No results for input(s): GLUCAP in the last 168 hours. Lipid Profile: No results for input(s): CHOL, HDL, LDLCALC, TRIG, CHOLHDL, LDLDIRECT in the last 72 hours. Thyroid Function Tests: No results for input(s): TSH, T4TOTAL, FREET4, T3FREE, THYROIDAB in the last 72 hours. Anemia Panel: No results for input(s): VITAMINB12, FOLATE, FERRITIN,  TIBC, IRON, RETICCTPCT in the last 72 hours. Urine analysis:    Component Value Date/Time   COLORURINE YELLOW 07/29/2020 1630   APPEARANCEUR HAZY (A) 07/29/2020 1630   LABSPEC 1.006 07/29/2020 1630   PHURINE 8.0 07/29/2020 1630   GLUCOSEU >=500 (A) 07/29/2020 1630   HGBUR NEGATIVE 07/29/2020 1630   BILIRUBINUR NEGATIVE 07/29/2020 1630   KETONESUR NEGATIVE 07/29/2020 1630   PROTEINUR 100 (A) 07/29/2020 1630   NITRITE NEGATIVE 07/29/2020 1630   LEUKOCYTESUR LARGE (A) 07/29/2020 1630    Radiological Exams on Admission: Personally reviewed  No results found.  EKG: reportedly unremarkable at [facility]. Not able to view imaging    Assessment and Plan:   Active Problems:   Acute respiratory failure with hypoxia (HCC)   1.Acute hypoxic respiratory failure: CXR shows edema. Likely due to volume overload in the setting of ESRD. Currently feeling better with 3lits 02. Taper o2 as tolerated after dialysis/fluid removal. Doubt PE as on Eliquis, compliant. Covid and Flu panel negative at  [facility]  2. ESRD: On HD M-W-F. Compliant but appears to bolume overloaded currently. Evaluated by nephrology and plan for dialysis this am. Resume chrnic meds including vit d, renvela, ca supplements  3. Acute on chronic anemia: On Fe/Vitamin C supplements at home. Received 1 unit PRBC at [facility] and hgb 6.8->7.1 , repeat level here. If needed repeat PRBC with HD . Has h/o GIB but denies ongoing hematochezia/melena. On Eliquis for A fib  Addendum: Repeat lab here shows hemoglobin 6.9, ordered another unit of transfusion with patient's consent to be given with HD.  4.  Diabetes Mellitus: ADA diet, resume sq insulin, hold glipizide , SSI.   5. Chronic Atrial fib: Resumed amiodarone, diltiazem and eliquis (as no active bleeding) . Hold anticoagulation iof hgb continues to downtrend or active signs of bleeding  6. HTN: Resumed home meds diltiazem and hydralazine 25 mg (was taking ?50 mg at home)   DVT prophylaxis: On ELiquis  COVID screen: negative at [facility]  Code Status:   Full code .Health care proxy would be spouse Azul Coffie or daughter Amy  Patient/Family Communication: Discussed with patient and all questions answered to satisfaction.  Consults called: Nephrology Admission status :Patient will be admitted under OBSERVATION status.The patient's presenting symptoms, physical exam findings, and initial radiographic and laboratory data in the context of their medical condition is felt to place them at low risk for further clinical deterioration. Furthermore, it is anticipated that the patient will be medically stable for discharge from the hospital within 2 midnights of hospital stay.  Hopefull will improve quickly after HD     Guilford Shi MD [facility] Pager in [place]  If 7PM-7AM, please contact night-coverage [email or web address]   01/09/2021, 8:56 AM

## 2021-01-09 NOTE — ED Provider Notes (Signed)
[facility] EMERGENCY DEPARTMENT Provider Note   CSN: [id number] Arrival date & time: 01/09/21  8185     History Chief Complaint  Patient presents with   Shortness of Breath    [name] is a 80 y.o. female.  The history is provided by the patient and medical records.  Shortness of Breath [name] is a 80 y.o. female who presents to the Emergency Department complaining of shortness of breath. She presents the emergency department as a transfer from [facility] for evaluation of shortness of breath. She states that she has ESR D and dialysis Monday, Wednesday, Friday. She was able to receive her full dialysis sessions this week. She states that on Monday after her session she did get sick with nausea and vomiting times one. She complains of progressive abdominal bloating over the last several weeks as well as progressive shortness of breath. She has nonproductive cough. No fevers, chest pain. She describes the bloating to her abdomen is mildly uncomfortable. She does have a history of recurrent anemia when she was at [facility] she was transfuse one unit PR BC. She does have a new oxygen requirement and therefore was transferred for further evaluation.  labs from [facility] with WBC 9.6, hemoglobin 6.9, platelet 535, D dimer elevated at 1722 ng/mL, sodium 136, potassium 3.6, chloride 90, bicarb 34, anion gap 16, BUN 29, creatinine 3.5, calcium 9.3, glucose 137, bilirubin .5, AST 26, ALT 25, Alkphos 176, CK 52, troponin I1 .2 - normal is less than .04, Pro BNP 31300  - Normal is less than 1800. Repeat hemoglobin after transfusion 7.1 influenza, COVID and RSV negative    Past Medical History:  Diagnosis Date   Cancer ([place])    Chronic kidney disease    Diabetes mellitus    Hyperlipidemia    Hypertension    pt states at times    Patient Active Problem List   Diagnosis Date Noted   Gastritis and gastroduodenitis    ESRD on  hemodialysis ([place])    Upper GI bleed    COVID-19 virus infection    Atrial fibrillation with RVR ([place])    Hemorrhagic stroke ([place])    CVA (cerebral vascular accident) ([place]) 07/12/2020   Fall 07/06/2018   Left temporal lobe infarction ([place]) 09/05/2017   TIA (transient ischemic attack) 09/04/2017   CKD (chronic kidney disease) stage 3, GFR 30-59 ml/min ([place]) 09/03/2017   Essential hypertension 09/03/2017   DM type 2 (diabetes mellitus, type 2) ([place]) 09/03/2017   Lobular carcinoma of right breast ([place]) 03/01/2011    Past Surgical History:  Procedure Laterality Date   BIOPSY  07/17/2020   Procedure: BIOPSY;  Surgeon: Yetta Flock, MD;  Location: [place];  Service: Gastroenterology;;   BREAST LUMPECTOMY  03/26/2011   right   ESOPHAGOGASTRODUODENOSCOPY (EGD) WITH PROPOFOL N/A 07/17/2020   Procedure: ESOPHAGOGASTRODUODENOSCOPY (EGD) WITH PROPOFOL;  Surgeon: Yetta Flock, MD;  Location: [place];  Service: Gastroenterology;  Laterality: N/A;     OB History   No obstetric history on file.     Family History  Problem Relation Age of Onset   Cancer Father        bronchial   Cancer Paternal Aunt        breast   Cancer Paternal Aunt        breast    Social History   Tobacco Use   Smoking status: Never   Smokeless tobacco: Never  Vaping Use  Vaping Use: Never used  Substance Use Topics   Alcohol use: No   Drug use: No    Home Medications Prior to Admission medications   Medication Sig Start Date End Date Taking? Authorizing Provider  amiodarone (PACERONE) 200 MG tablet Take 1 tablet (200 mg total) by mouth daily. 07/22/20   Thurnell Lose, MD  apixaban (ELIQUIS) 2.5 MG TABS tablet Take 1 tablet (2.5 mg total) by mouth 2 (two) times daily. 09/04/20   [name], MD  ascorbic acid (VITAMIN C) 500 MG tablet Take 500 mg by mouth 2 (two) times daily.    [provider]  atorvastatin (LIPITOR) 20 MG tablet Take 20 mg by mouth at  bedtime.    [provider]  Calcium Carbonate (CALTRATE 600 PO) Take 2 tablets by mouth daily.    [provider]  Cholecalciferol (VITAMIN D) 2000 UNITS tablet Take 2,000 Units by mouth daily.    [provider]  diltiazem (CARDIZEM CD) 120 MG 24 hr capsule Take 1 capsule (120 mg total) by mouth daily. 07/22/20   Thurnell Lose, MD  glipiZIDE (GLUCOTROL XL) 2.5 MG 24 hr tablet Take 5 mg by mouth daily. 03/13/20   [provider]  hydrALAZINE (APRESOLINE) 25 MG tablet Take 1 tablet (25 mg total) by mouth 2 (two) times daily. Patient taking differently: Take 25 mg by mouth 2 (two) times daily. 2 tablets twice daily 04/20/19   Nicholas Lose, MD  Insulin Degludec-Liraglutide (XULTOPHY) 100-3.6 UNIT-MG/ML SOPN Inject 26 Units into the skin every morning.    [provider]  letrozole (FEMARA) 2.5 MG tablet Take 1 tablet (2.5 mg total) by mouth daily. Patient taking differently: Take 2.5 mg by mouth at bedtime. 04/21/20   Nicholas Lose, MD  pantoprazole (PROTONIX) 40 MG tablet Take 1 tablet (40 mg total) by mouth 2 (two) times daily. 07/22/20   Thurnell Lose, MD  sevelamer carbonate (RENVELA) 800 MG tablet Take 800 mg by mouth 3 (three) times daily with meals. 05/20/20   [provider]  zinc sulfate 220 (50 Zn) MG capsule Take 220 mg by mouth 2 (two) times daily.    [provider]    Allergies    Patient has no known allergies.  Review of Systems   Review of Systems  Respiratory:  Positive for shortness of breath.   All other systems reviewed and are negative.  Physical Exam Updated Vital Signs BP (!) 150/70 (BP Location: Left Arm)   Pulse (!) 27   Temp 98.7 F (37.1 C)   Resp 20   SpO2 99%   Physical Exam Vitals and nursing note reviewed.  Constitutional:      Appearance: She is well-developed.  HENT:     Head: Normocephalic and atraumatic.  Cardiovascular:     Rate and Rhythm: Normal rate and regular rhythm.      Heart sounds: Murmur heard.  Pulmonary:     Effort: Pulmonary effort is normal. No respiratory distress.     Comments: Crackles and lung bases bilaterally. Vas cath in right upper chest wall with dressing c/d/i Abdominal:     Palpations: Abdomen is soft.     Tenderness: There is no abdominal tenderness. There is no guarding or rebound.  Musculoskeletal:        General: No swelling or tenderness.  Skin:    General: Skin is warm and dry.  Neurological:     Mental Status: She is alert and oriented to person, place,  and time.  Psychiatric:        Behavior: Behavior normal.    ED Results / Procedures / Treatments   Labs (all labs ordered are listed, but only abnormal results are displayed) Labs Reviewed - No data to display  EKG None  Radiology No results found.  Procedures Procedures   Medications Ordered in ED Medications - No data to display  ED Course  I have reviewed the triage vital signs and the nursing notes.  Pertinent labs & imaging results that were available during my care of the patient were reviewed by me and considered in my medical decision making (see chart for details).    MDM Rules/Calculators/A&P                         patient with history of ESR D on hemodialysis as well as chronic anemia and chronic G.I. bleed on Eliquis for afib transferred from [facility] for shortness of breath. She does have pulmonary vascular congestion on her chest x-ray as well as new oxygen requirement of 4 L. She is not in distress on ED presentation. Nephrology consulted for dialysis. Hospitalist consulted for admission. Patient is in agreement with treatment plan.  Final Clinical Impression(s) / ED Diagnoses Final diagnoses:  None    Rx / DC Orders ED Discharge Orders     None        Quintella Reichert, MD 01/09/21 [phone]

## 2021-01-10 DIAGNOSIS — Z79899 Other long term (current) drug therapy: Secondary | ICD-10-CM | POA: Diagnosis not present

## 2021-01-10 DIAGNOSIS — Z79811 Long term (current) use of aromatase inhibitors: Secondary | ICD-10-CM | POA: Diagnosis not present

## 2021-01-10 DIAGNOSIS — D631 Anemia in chronic kidney disease: Secondary | ICD-10-CM | POA: Diagnosis present

## 2021-01-10 DIAGNOSIS — Z803 Family history of malignant neoplasm of breast: Secondary | ICD-10-CM | POA: Diagnosis not present

## 2021-01-10 DIAGNOSIS — E8779 Other fluid overload: Secondary | ICD-10-CM | POA: Diagnosis not present

## 2021-01-10 DIAGNOSIS — I482 Chronic atrial fibrillation, unspecified: Secondary | ICD-10-CM | POA: Diagnosis present

## 2021-01-10 DIAGNOSIS — N186 End stage renal disease: Secondary | ICD-10-CM | POA: Diagnosis present

## 2021-01-10 DIAGNOSIS — R195 Other fecal abnormalities: Secondary | ICD-10-CM | POA: Diagnosis present

## 2021-01-10 DIAGNOSIS — Z992 Dependence on renal dialysis: Secondary | ICD-10-CM | POA: Diagnosis not present

## 2021-01-10 DIAGNOSIS — Z794 Long term (current) use of insulin: Secondary | ICD-10-CM | POA: Diagnosis not present

## 2021-01-10 DIAGNOSIS — D649 Anemia, unspecified: Secondary | ICD-10-CM | POA: Diagnosis present

## 2021-01-10 DIAGNOSIS — E8889 Other specified metabolic disorders: Secondary | ICD-10-CM | POA: Diagnosis present

## 2021-01-10 DIAGNOSIS — J9601 Acute respiratory failure with hypoxia: Secondary | ICD-10-CM | POA: Diagnosis present

## 2021-01-10 DIAGNOSIS — J810 Acute pulmonary edema: Secondary | ICD-10-CM | POA: Diagnosis present

## 2021-01-10 DIAGNOSIS — E8770 Fluid overload, unspecified: Secondary | ICD-10-CM | POA: Diagnosis present

## 2021-01-10 DIAGNOSIS — E1129 Type 2 diabetes mellitus with other diabetic kidney complication: Secondary | ICD-10-CM | POA: Diagnosis not present

## 2021-01-10 DIAGNOSIS — I120 Hypertensive chronic kidney disease with stage 5 chronic kidney disease or end stage renal disease: Secondary | ICD-10-CM | POA: Diagnosis present

## 2021-01-10 DIAGNOSIS — Z7984 Long term (current) use of oral hypoglycemic drugs: Secondary | ICD-10-CM | POA: Diagnosis not present

## 2021-01-10 DIAGNOSIS — N250 Renal osteodystrophy: Secondary | ICD-10-CM | POA: Diagnosis not present

## 2021-01-10 DIAGNOSIS — K5900 Constipation, unspecified: Secondary | ICD-10-CM | POA: Diagnosis present

## 2021-01-10 DIAGNOSIS — C50911 Malignant neoplasm of unspecified site of right female breast: Secondary | ICD-10-CM | POA: Diagnosis present

## 2021-01-10 DIAGNOSIS — Z20822 Contact with and (suspected) exposure to covid-19: Secondary | ICD-10-CM | POA: Diagnosis present

## 2021-01-10 DIAGNOSIS — E1122 Type 2 diabetes mellitus with diabetic chronic kidney disease: Secondary | ICD-10-CM | POA: Diagnosis present

## 2021-01-10 DIAGNOSIS — Z801 Family history of malignant neoplasm of trachea, bronchus and lung: Secondary | ICD-10-CM | POA: Diagnosis not present

## 2021-01-10 DIAGNOSIS — E785 Hyperlipidemia, unspecified: Secondary | ICD-10-CM | POA: Diagnosis present

## 2021-01-10 DIAGNOSIS — Z7901 Long term (current) use of anticoagulants: Secondary | ICD-10-CM | POA: Diagnosis not present

## 2021-01-10 DIAGNOSIS — R0602 Shortness of breath: Secondary | ICD-10-CM | POA: Diagnosis present

## 2021-01-10 DIAGNOSIS — N2581 Secondary hyperparathyroidism of renal origin: Secondary | ICD-10-CM | POA: Diagnosis present

## 2021-01-10 LAB — BPAM RBC
Blood Product Expiration Date: 202207162359
ISSUE DATE / TIME: 202206171341
Unit Type and Rh: 5100

## 2021-01-10 LAB — HEMOGLOBIN A1C
Hgb A1c MFr Bld: 5.6 % (ref 4.8–5.6)
Mean Plasma Glucose: 114 mg/dL

## 2021-01-10 LAB — BASIC METABOLIC PANEL
Anion gap: 11 (ref 5–15)
BUN: 16 mg/dL (ref 8–23)
CO2: 28 mmol/L (ref 22–32)
Calcium: 9 mg/dL (ref 8.9–10.3)
Chloride: 96 mmol/L — ABNORMAL LOW (ref 98–111)
Creatinine, Ser: 2.8 mg/dL — ABNORMAL HIGH (ref 0.44–1.00)
GFR, Estimated: 17 mL/min — ABNORMAL LOW (ref >60)
Glucose, Bld: 143 mg/dL — ABNORMAL HIGH (ref 70–99)
Potassium: 3.5 mmol/L (ref 3.5–5.1)
Sodium: 135 mmol/L (ref 135–145)

## 2021-01-10 LAB — TYPE AND SCREEN
ABO/RH(D): O POS
Antibody Screen: NEGATIVE
Unit division: 0

## 2021-01-10 LAB — GLUCOSE, CAPILLARY
Glucose-Capillary: 151 mg/dL — ABNORMAL HIGH (ref 70–99)
Glucose-Capillary: 184 mg/dL — ABNORMAL HIGH (ref 70–99)
Glucose-Capillary: 186 mg/dL — ABNORMAL HIGH (ref 70–99)
Glucose-Capillary: 206 mg/dL — ABNORMAL HIGH (ref 70–99)

## 2021-01-10 LAB — CBC
HCT: 26.5 % — ABNORMAL LOW (ref 36.0–46.0)
Hemoglobin: 8.5 g/dL — ABNORMAL LOW (ref 12.0–15.0)
MCH: 27.2 pg (ref 26.0–34.0)
MCHC: 32.1 g/dL (ref 30.0–36.0)
MCV: 84.9 fL (ref 80.0–100.0)
Platelets: 391 10*3/uL (ref 150–400)
RBC: 3.12 MIL/uL — ABNORMAL LOW (ref 3.87–5.11)
RDW: 17.9 % — ABNORMAL HIGH (ref 11.5–15.5)
WBC: 8.1 10*3/uL (ref 4.0–10.5)
nRBC: 0 % (ref 0.0–0.2)

## 2021-01-10 MED ORDER — CHLORHEXIDINE GLUCONATE CLOTH 2 % EX PADS
6.0000 | MEDICATED_PAD | Freq: Every day | CUTANEOUS | Status: DC
  Administered 2021-01-10: 6 via TOPICAL

## 2021-01-10 MED ORDER — HEPARIN SODIUM (PORCINE) 1000 UNIT/ML IJ SOLN
INTRAMUSCULAR | Status: AC
  Administered 2021-01-10: 1000 [IU]
  Filled 2021-01-10: qty 4

## 2021-01-10 NOTE — Progress Notes (Signed)
PROGRESS NOTE    [name]  UTM:[id number] DOB: 07-Dec-1940 DOA: 01/09/2021 PCP: [name], MD     Brief Narrative:  Patient is a 80 year old female with a history of end-stage renal disease on hemodialysis MWF, hypertension, diabetes, hyperlipidemia, A. fib who presented to [facility] with shortness of breath and bloating.  She had hypoxia and was placed on 4 L of oxygen.  Chest x-ray showed edema.  Her hemoglobin was 6.8 and she received 1 unit of blood.  Repeat was also low and she received another unit.  She had hemodialysis on 6/17 as well and noted some improvement in her breathing.   New events last 24 hours / Subjective: Patient's hemoglobin holding steady.  Nephrology wants to do another round of dialysis today to remove more fluid.  She is hopeful that we will solve her issue and she can go home tomorrow.  Assessment & Plan:   Principal Problem:   Acute respiratory failure with hypoxia (HCC)   O2 as needed  Likely 2/2 volume overload  HD again today  Active Problems:   Essential hypertension  Hydralazine prn     DM type 2 (diabetes mellitus, type 2) (HCC)  SSI  Lipitor 20 mg/d    Atrial fibrillation  Amiodarone 200 mg daily  Cardizem 120 mg daily  Eliquis 2.5 mg bid    ESRD on hemodialysis (HCC)  Appreciate Nephro  HD MWF normally  Monitor renal panel  Renal diet  Renvela 800 mg TID  Femara 2.5 mg/d    Anemia  S/P 2 u PRBCs  Monitor CBC    Acute pulmonary edema (HCC)  HD today   DVT prophylaxis: on Eliquis Code Status: Full Family Communication: Self treated for sinusitis Coming From: Home to finally completed Disposition Plan: Home Barriers to Discharge: hypoxia  Consultants:  Nephrology  Procedures:  Hemodialysis  Objective: Vitals:   01/10/21 1400 01/10/21 1430 01/10/21 1500 01/10/21 1530  BP: 137/69 136/74 140/72 (!) 141/71  Pulse: 72 70 78 78  Resp:      Temp:      TempSrc:      SpO2:      Weight:      Height:         Intake/Output Summary (Last 24 hours) at 01/10/2021 1546 Last data filed at 01/10/2021 0800 Gross per 24 hour  Intake 520 ml  Output --  Net 520 ml   Filed Weights   01/09/21 1650 01/10/21 1300  Weight: 68.4 kg 67.5 kg    Examination:  General exam: Appears calm and comfortable  Respiratory system: Clear to auscultation. Respiratory effort normal. No respiratory distress. No conversational dyspnea.  Cardiovascular system: S1 & S2 heard, RRR. No murmurs. No pedal edema. Gastrointestinal system: Abdomen is nondistended, soft and nontender. Normal bowel sounds heard. Central nervous system: Alert and oriented. No focal neurological deficits. Speech clear.  Extremities: Symmetric in appearance  Skin: No rashes, lesions or ulcers on exposed skin  Psychiatry: Judgement and insight appear normal. Mood & affect appropriate.   Data Reviewed: I have personally reviewed following labs and imaging studies  CBC: Recent Labs  Lab 01/09/21 0829 01/10/21 0259  WBC 10.1 8.1  NEUTROABS 8.6*  --   HGB 6.9* 8.5*  HCT 22.9* 26.5*  MCV 85.4 84.9  PLT 432* 568   Basic Metabolic Panel: Recent Labs  Lab 01/09/21 0829 01/10/21 0259  NA 134* 135  K 3.6 3.5  CL 92* 96*  CO2 28 28  GLUCOSE  87 143*  BUN 31* 16  CREATININE 4.28* 2.80*  CALCIUM 8.8* 9.0  PHOS 3.7  --    GFR: Estimated Creatinine Clearance: 15.4 mL/min (A) (by C-G formula based on SCr of 2.8 mg/dL (H)).  Liver Function Tests: Recent Labs  Lab 01/09/21 0829  ALBUMIN 2.1*    HbA1C: Recent Labs    01/09/21 1025  HGBA1C 5.6   CBG: Recent Labs  Lab 01/09/21 1701 01/09/21 2228 01/10/21 0746 01/10/21 1148  GLUCAP 115* 179* 151* 184*    Recent Results (from the past 240 hour(s))  SARS CORONAVIRUS 2 (TAT 6-24 HRS) Nasopharyngeal Nasopharyngeal Swab     Status: None   Collection Time: 01/09/21 10:25 AM   Specimen: Nasopharyngeal Swab  Result Value Ref Range Status   SARS Coronavirus 2 NEGATIVE NEGATIVE  Final    Comment: (NOTE) SARS-CoV-2 target nucleic acids are NOT DETECTED.  The SARS-CoV-2 RNA is generally detectable in upper and lower respiratory specimens during the acute phase of infection. Negative results do not preclude SARS-CoV-2 infection, do not rule out co-infections with other pathogens, and should not be used as the sole basis for treatment or other patient management decisions. Negative results must be combined with clinical observations, patient history, and epidemiological information. The expected result is Negative.  Fact Sheet for Patients: SugarRoll.be  Fact Sheet for Healthcare Providers: https://www.woods-mathews.com/  This test is not yet approved or cleared by the [place] FDA and  has been authorized for detection and/or diagnosis of SARS-CoV-2 by FDA under an Emergency Use Authorization (EUA). This EUA will remain  in effect (meaning this test can be used) for the duration of the COVID-19 declaration under Se ction 564(b)(1) of the Act, 21 U.S.C. section 360bbb-3(b)(1), unless the authorization is terminated or revoked sooner.  Performed at [facility], [place] [address], [place], [place] [address]       Radiology Studies: DG CHEST PORT 1 VIEW  Result Date: 01/09/2021 CLINICAL DATA:  Shortness of breath.  Renal failure EXAM: PORTABLE CHEST 1 VIEW COMPARISON:  January 09, 2021 study obtained earlier in the day FINDINGS: Central catheter tip is at the cavoatrial junction. No pneumothorax. There is cardiomegaly with pulmonary venous hypertension. There are pleural effusions bilaterally with interstitial edema, increased from earlier in the day. There is ill-defined airspace opacity in the lung bases as well. There is aortic atherosclerosis. No adenopathy. No bone lesions. IMPRESSION: Central catheter as described without pneumothorax. Cardiomegaly with pulmonary vascular congestion. Increased interstitial  edema with suspected alveolar edema in the bases and pleural effusions. Appearance overall consistent with congestive heart failure. Electronically Signed   By: Lowella Grip III M.D.   On: 01/09/2021 09:13     Scheduled Meds:  (feeding supplement) PROSource Plus  30 mL Oral BID BM   sodium chloride   Intravenous Once   amiodarone  200 mg Oral Daily   apixaban  2.5 mg Oral BID   vitamin C  500 mg Oral BID   atorvastatin  20 mg Oral QHS   calcium carbonate  1 tablet Oral BID WC   Chlorhexidine Gluconate Cloth  6 each Topical Q0600   cholecalciferol  2,000 Units Oral Daily   diltiazem  120 mg Oral Daily   heparin sodium (porcine)       insulin aspart  0-6 Units Subcutaneous TID WC   Insulin Degludec-Liraglutide  26 Units Subcutaneous q morning   letrozole  2.5 mg Oral Daily   pantoprazole  40 mg Oral BID  sevelamer carbonate  800 mg Oral TID WC   Continuous Infusions:   LOS: 0 days    Time spent: 30 minutes   Shelda Pal, DO [facility] 01/10/2021, 3:46 PM   Available via Epic secure chat 7am-7pm After these hours, please refer to coverage provider listed on [email or web address]

## 2021-01-10 NOTE — Progress Notes (Signed)
Doing ok with extra dialysis - 3L UFG. Almost done with treatment ~30 min left. Turned O2 off again. Sats holding ~ 94%. Will leave the O2 off and recheck her in another few minutes.  Veneta Penton, PA-C [employer] Pager [phone]

## 2021-01-10 NOTE — Progress Notes (Signed)
[facility] Progress Note   Subjective:  Seen in room - did fine with HD yesterday, got 3.4L off. Also was transfused 2U PRBCs yesterday. She feels a lot better today, but still wearing O2. I turned it off and left her alone for a while, then came back and checked her - she was feeling fine, but O2 sat 81-83% range, so put the O2 back on her. Will plan for extra HD today for further volume reduction.  Objective Vitals:   01/09/21 2107 01/10/21 0041 01/10/21 0523 01/10/21 1016  BP: (!) 151/71 (!) 149/70 (!) 142/71 (!) 154/76  Pulse: 88 85 80 86  Resp: 18 16 16 19   Temp: 98.8 F (37.1 C) 98.2 F (36.8 C) 98 F (36.7 C) 98.5 F (36.9 C)  TempSrc: Oral Oral Oral Oral  SpO2: 92% 95% 93% 96%  Weight:      Height:       Physical Exam General: Well appearing woman, NAD. Nasal O2 2L/min in place. Heart: RRR; 3/6 murmur Lungs: Bibasilar rales, clear in upper lobes (better) Abdomen: distended, but non-tender Extremities: No LE edema Dialysis Access: TDC in R chest  Additional Objective Labs: Basic Metabolic Panel: Recent Labs  Lab 01/09/21 0829 01/10/21 0259  NA 134* 135  K 3.6 3.5  CL 92* 96*  CO2 28 28  GLUCOSE 87 143*  BUN 31* 16  CREATININE 4.28* 2.80*  CALCIUM 8.8* 9.0  PHOS 3.7  --    Liver Function Tests: Recent Labs  Lab 01/09/21 0829  ALBUMIN 2.1*   CBC: Recent Labs  Lab 01/09/21 0829 01/10/21 0259  WBC 10.1 8.1  NEUTROABS 8.6*  --   HGB 6.9* 8.5*  HCT 22.9* 26.5*  MCV 85.4 84.9  PLT 432* 391   CBG: Recent Labs  Lab 01/09/21 1701 01/09/21 2228 01/10/21 0746  GLUCAP 115* 179* 151*   Studies/Results: DG CHEST PORT 1 VIEW  Result Date: 01/09/2021 CLINICAL DATA:  Shortness of breath.  Renal failure EXAM: PORTABLE CHEST 1 VIEW COMPARISON:  January 09, 2021 study obtained earlier in the day FINDINGS: Central catheter tip is at the cavoatrial junction. No pneumothorax. There is cardiomegaly with pulmonary venous hypertension. There are  pleural effusions bilaterally with interstitial edema, increased from earlier in the day. There is ill-defined airspace opacity in the lung bases as well. There is aortic atherosclerosis. No adenopathy. No bone lesions. IMPRESSION: Central catheter as described without pneumothorax. Cardiomegaly with pulmonary vascular congestion. Increased interstitial edema with suspected alveolar edema in the bases and pleural effusions. Appearance overall consistent with congestive heart failure. Electronically Signed   By: Lowella Grip III M.D.   On: 01/09/2021 09:13   Medications:   (feeding supplement) PROSource Plus  30 mL Oral BID BM   sodium chloride   Intravenous Once   amiodarone  200 mg Oral Daily   apixaban  2.5 mg Oral BID   vitamin C  500 mg Oral BID   atorvastatin  20 mg Oral QHS   calcium carbonate  1 tablet Oral BID WC   Chlorhexidine Gluconate Cloth  6 each Topical Q0600   cholecalciferol  2,000 Units Oral Daily   diltiazem  120 mg Oral Daily   insulin aspart  0-6 Units Subcutaneous TID WC   Insulin Degludec-Liraglutide  26 Units Subcutaneous q morning   letrozole  2.5 mg Oral Daily   pantoprazole  40 mg Oral BID   sevelamer carbonate  800 mg Oral TID WC    Dialysis Orders:  MWF at [facility] 3:30hr, 400/500, EDW 70.5kg, 2K/2Ca, UFP #2, TDC, no heparin - Venofer 100mg  x 10 ordered - Hgb 7.6 with tsat 11% on 12/31/20 - Mircera 265mcg Iv q 2 weeks - Calcitirol 0.72mcg PO q HD   Assessment/Plan:  Acute Resp Failure/Pulmonary edema: Suspect weight loss as trigger as has been compliant with HD sessions. S/p 3.4L removed yesterday, still hypoxic without O2 -> will plan another HD session today.  ESRD:  Usual MWF schedule -> extra HD today.  Hypertension/volume: BP still high side, inch the EDW down further.  Anemia of ESRD: Hgb 6.9 -> 8.5 s/p 2U PRBCs. On max ESA as outpatient. Continue IV iron as outpatient.  Metabolic bone disease: Ca/Phos ok - continue home meds.  Nutrition:  Alb  low, adding supplements.  Hx R breast cancer: S/p lumpectomy, remains on Letrozole. T2DM A-fib: On amiodarone + Eliquis.  Veneta Penton, PA-C 01/10/2021, 10:53 AM  [employer]

## 2021-01-11 DIAGNOSIS — J9601 Acute respiratory failure with hypoxia: Secondary | ICD-10-CM | POA: Diagnosis not present

## 2021-01-11 LAB — CBC
HCT: 29.2 % — ABNORMAL LOW (ref 36.0–46.0)
Hemoglobin: 9.2 g/dL — ABNORMAL LOW (ref 12.0–15.0)
MCH: 26.7 pg (ref 26.0–34.0)
MCHC: 31.5 g/dL (ref 30.0–36.0)
MCV: 84.9 fL (ref 80.0–100.0)
Platelets: 385 10*3/uL (ref 150–400)
RBC: 3.44 MIL/uL — ABNORMAL LOW (ref 3.87–5.11)
RDW: 18.6 % — ABNORMAL HIGH (ref 11.5–15.5)
WBC: 6.9 10*3/uL (ref 4.0–10.5)
nRBC: 0.3 % — ABNORMAL HIGH (ref 0.0–0.2)

## 2021-01-11 LAB — RENAL FUNCTION PANEL
Albumin: 2.2 g/dL — ABNORMAL LOW (ref 3.5–5.0)
Anion gap: 14 (ref 5–15)
BUN: 36 mg/dL — ABNORMAL HIGH (ref 8–23)
CO2: 27 mmol/L (ref 22–32)
Calcium: 9.7 mg/dL (ref 8.9–10.3)
Chloride: 93 mmol/L — ABNORMAL LOW (ref 98–111)
Creatinine, Ser: 4.55 mg/dL — ABNORMAL HIGH (ref 0.44–1.00)
GFR, Estimated: 9 mL/min — ABNORMAL LOW (ref >60)
Glucose, Bld: 164 mg/dL — ABNORMAL HIGH (ref 70–99)
Phosphorus: 3.1 mg/dL (ref 2.5–4.6)
Potassium: 3.9 mmol/L (ref 3.5–5.1)
Sodium: 134 mmol/L — ABNORMAL LOW (ref 135–145)

## 2021-01-11 LAB — GLUCOSE, CAPILLARY
Glucose-Capillary: 144 mg/dL — ABNORMAL HIGH (ref 70–99)
Glucose-Capillary: 225 mg/dL — ABNORMAL HIGH (ref 70–99)
Glucose-Capillary: 148 mg/dL — ABNORMAL HIGH (ref 70–99)

## 2021-01-11 MED ORDER — LETROZOLE 2.5 MG PO TABS: 2.5000 mg | ORAL_TABLET | Freq: Every day | ORAL | Status: DC

## 2021-01-11 NOTE — TOC Progression Note (Signed)
Transition of Care [facility]) - Progression Note    Patient Details  Name: [name] MRN: [id number] Date of Birth: 10/26/1940  Transition [facility] CM/SW Contact  Bartholomew Crews, RN Phone Number: [phone] 01/11/2021, 2:39 PM  Clinical Narrative:     Spoke with patient at the bedside to discuss transition planning. PTA home with spouse. Daughter checks in daily. Daughter provides transportation for dialysis on MWF [place] clinic. Patient wanting to return home with husband. Referral for [facility] PT accepted by [place]. Agency requesting that DC summary be faxed. Daughter to provide transportation home. Continuing to follow for home oxygen needs.   Expected Discharge Plan: [place] Barriers to Discharge: Continued Medical Work up  Expected Discharge Plan and Services Expected Discharge Plan: [place]   Discharge Planning Services: CM Consult Post Acute Care Choice: [place] arrangements for the past 2 months: Single Family Home                 DME Arranged: N/A DME Agency: NA       HH Arranged: PT HH Agency: [facility] Date [place]: 01/11/21 Time [place]: [place] Representative spoke with at [place]: [place] ([place]) Interventions    Readmission Risk Interventions No flowsheet data found.

## 2021-01-11 NOTE — Progress Notes (Signed)
[facility] Progress Note   Subjective:  S/p extra dialysis yesterday with another 3L off. Weight down to 64.5kg now which is 6kg below prior dry weight. Breathing improved, no new symptoms.   Objective Vitals:   01/10/21 1608 01/10/21 1620 01/10/21 1921 01/11/21 0558  BP: 138/72 136/67 135/68 (!) 146/72  Pulse: 92 87 81 80  Resp: 18  17 18   Temp: 98.5 F (36.9 C)  98.4 F (36.9 C) 98.2 F (36.8 C)  TempSrc: Oral  Oral   SpO2: 99%  95% 96%  Weight: 64.5 kg     Height:       Physical Exam General: Well appearing woman, NAD. O2 cannula in nose, but O2 off at time of my visit. Heart: RRR; 3/6 murmur Lungs: CTAB at this time; no rales or wheezing Abdomen: distended, but non-tender Extremities: No LE edema Dialysis Access: [facility] in R chest  Additional Objective Labs: Basic Metabolic Panel: Recent Labs  Lab 01/09/21 0829 01/10/21 0259 01/11/21 0303  NA 134* 135 134*  K 3.6 3.5 3.9  CL 92* 96* 93*  CO2 28 28 27   GLUCOSE 87 143* 164*  BUN 31* 16 36*  CREATININE 4.28* 2.80* 4.55*  CALCIUM 8.8* 9.0 9.7  PHOS 3.7  --  3.1   Liver Function Tests: Recent Labs  Lab 01/09/21 0829 01/11/21 0303  ALBUMIN 2.1* 2.2*   CBC: Recent Labs  Lab 01/09/21 0829 01/10/21 0259 01/11/21 0303  WBC 10.1 8.1 6.9  NEUTROABS 8.6*  --   --   HGB 6.9* 8.5* 9.2*  HCT 22.9* 26.5* 29.2*  MCV 85.4 84.9 84.9  PLT 432* 391 385   Medications:   (feeding supplement) PROSource Plus  30 mL Oral BID BM   sodium chloride   Intravenous Once   amiodarone  200 mg Oral Daily   apixaban  2.5 mg Oral BID   vitamin C  500 mg Oral BID   atorvastatin  20 mg Oral QHS   Chlorhexidine Gluconate Cloth  6 each Topical Q0600   cholecalciferol  2,000 Units Oral Daily   diltiazem  120 mg Oral Daily   insulin aspart  0-6 Units Subcutaneous TID WC   Insulin Degludec-Liraglutide  26 Units Subcutaneous q morning   letrozole  2.5 mg Oral Daily   pantoprazole  40 mg Oral BID   sevelamer  carbonate  800 mg Oral TID WC    Dialysis Orders: MWF at [facility] 3:30hr, 400/500, EDW 70.5kg, 2K/2Ca, UFP #2, TDC, no heparin - Venofer 100mg  x 10 ordered - Hgb 7.6 with tsat 11% on 12/31/20 - Mircera 277mcg Iv q 2 weeks - Calcitirol 0.3mcg PO q HD   Assessment/Plan:  Acute Resp Failure/Pulmonary edema: Suspect weight loss as trigger as has been compliant with HD sessions. S/p 3.4L removed 6/17, still hypoxic without O2 so had another HD on 6/18.  ESRD:  Usual MWF schedule -> for usual HD tomorrow, either here or outpatient.  Hypertension/volume: BP looking a lot better. Dry weight will be lowered from 70.5 to 64.5kg on discharge.  Anemia of ESRD: Hgb 6.9 -> 8.5 s/p 2U PRBCs. On max ESA as outpatient. Continue IV iron as outpatient. Hgb 9.2 today.  Metabolic bone disease: CorrCa high today - had been getting Tums here, d/c'd. Continue calcitriol and D3 for now. Phos good on Renvela as binder.  Nutrition:  Alb low, continue supplements.  Hx R breast cancer: S/p lumpectomy, remains on Letrozole. T2DM A-fib: On amiodarone + Eliquis.  Veneta Penton,  PA-C 01/11/2021, 10:36 AM  [facility]

## 2021-01-11 NOTE — TOC Transition Note (Signed)
Transition of Care [facility]) - CM/SW Discharge Note   Patient Details  Name: [name] MRN: [id number] Date of Birth: May 24, 1941  Transition of Care [facility]) CM/SW Contact:  Bartholomew Crews, RN Phone Number: [phone] 01/11/2021, 4:30 PM   Clinical Narrative:     Patient to transition home today. No medical necessity for home oxygen. HH PT/Face to Face order faxed to [place] along with DC summary ([phone]). Spoke to [place] at [place] to advise of DC and had faxed documents. Patient stated that her daughter is on the way. No further TOC needs identified.   Final next level of care: Home w Home Health Services Barriers to Discharge: No Barriers Identified   Patient Goals and CMS Choice Patient states their goals for this hospitalization and ongoing recovery are:: back home with husband CMS Medicare.gov Compare Post Acute Care list provided to:: Patient Choice offered to / list presented to : Patient  Discharge Placement                       Discharge Plan and Services   Discharge Planning Services: CM Consult Post Acute Care Choice: Home Health          DME Arranged: N/A DME Agency: NA       HH Arranged: PT [place] Agency: [facility] Date [place]: 01/11/21 Time [place]: 1630 Representative spoke with at [place]: [place] Determinants of Health ([place]) Interventions     Readmission Risk Interventions No flowsheet data found.

## 2021-01-11 NOTE — Progress Notes (Signed)
[name] to be discharged Home per MD order. Discussed prescriptions and follow up appointments with the patient. Medication list explained in detail. Patient and daughter verbalized understanding.  Skin clean, dry and intact without evidence of skin break down, no evidence of skin tears noted. IV catheter discontinued intact. Site without signs and symptoms of complications. Dressing and pressure applied. Pt denies pain at the site currently. No complaints noted.  Patient free of lines, drains, and wounds.   An After Visit Summary (AVS) was printed and given to the patient. Patient escorted via wheelchair, and discharged home via private auto.  Amaryllis Dyke, RN

## 2021-01-11 NOTE — TOC Initial Note (Signed)
Transition of Care [facility]) - Initial/Assessment Note    Patient Details  Name: [name] MRN: [id number] Date of Birth: Dec 01, 1940  Transition of Care [facility]) CM/SW Contact:    Bartholomew Crews, RN Phone Number: [phone] 01/11/2021, 12:42 PM  Clinical Narrative:                  Notified by CSW that patient wanting to return home with [facility] instead of SNF. Patient's choice for home health - [facility]. Referral pending. Patient will need HH PT and Face to Face order. No DME needed at this time - following for potential oxygen needs. Will need nursing or PT to document ambulatory sat progress note to determine medical necessity for home oxygen. TOC following for transition needs.   Expected Discharge Plan: [place] Barriers to Discharge: Continued Medical Work up   Patient Goals and CMS Choice Patient states their goals for this hospitalization and ongoing recovery are:: home with husband CMS Medicare.gov Compare Post Acute Care list provided to:: Patient Choice offered to / list presented to : Patient  Expected Discharge Plan and Services Expected Discharge Plan: [place]   Discharge Planning Services: CM Consult Post Acute Care Choice: [place] arrangements for the past 2 months: Single Family Home                 DME Arranged: N/A DME Agency: NA       HH Arranged: PT HH Agency: [facility] Date [place]: 01/11/21 Time [place]: 1241 Representative spoke with at [place]: [place] - referral pending  Prior Living Arrangements/Services Living arrangements for the past 2 months: [place] with:: Self, Spouse Patient language and need for interpreter reviewed:: Yes Do you feel safe going back to the place where you live?: Yes      Need for Family Participation in Patient Care: Yes (Comment) Care giver support system in place?: Yes (comment) Current home  services: DME (wheelchair; 3N1; ramp) Criminal Activity/Legal Involvement Pertinent to Current Situation/Hospitalization: No - Comment as needed  Activities of Daily Living Home Assistive Devices/Equipment: Eyeglasses, Environmental consultant (specify type) ADL Screening (condition at time of admission) Patient's cognitive ability adequate to safely complete daily activities?: Yes Is the patient deaf or have difficulty hearing?: No Does the patient have difficulty seeing, even when wearing glasses/contacts?: Yes Does the patient have difficulty concentrating, remembering, or making decisions?: No Patient able to express need for assistance with ADLs?: Yes Does the patient have difficulty dressing or bathing?: No Independently performs ADLs?: Yes (appropriate for developmental age) Does the patient have difficulty walking or climbing stairs?: No Weakness of Legs: Both Weakness of Arms/Hands: Left  Permission Sought/Granted                  Emotional Assessment Appearance:: Appears stated age Attitude/Demeanor/Rapport: Engaged Affect (typically observed): Accepting Orientation: : Oriented to Self, Oriented to  Time, Oriented to Place, Oriented to Situation Alcohol / Substance Use: Not Applicable Psych Involvement: No (comment)  Admission diagnosis:  SOB (shortness of breath) [R06.02] ESRD (end stage renal disease) (HCC) [N18.6] Acute respiratory failure with hypoxia (HCC) [J96.01] Patient Active Problem List   Diagnosis Date Noted   Anemia 01/10/2021   Acute pulmonary edema ([place]) 01/10/2021   Acute respiratory failure with hypoxia ([place]) 01/09/2021   Gastritis and gastroduodenitis    ESRD on hemodialysis ([place])    Upper GI bleed  CVA (cerebral vascular accident) ([place]) 07/12/2020   Left temporal lobe infarction ([place]) 09/05/2017   Essential hypertension 09/03/2017   DM type 2 (diabetes mellitus, type 2) ([place]) 09/03/2017   Lobular carcinoma of right breast ([place]) 03/01/2011   PCP:  [name], MD Pharmacy:   [facility] ([facility]) - [address], [place] [address] Phone: [phone] Fax: [phone]  [id number] - [place], [place] - [place] [address] Phone: [phone] Fax: [phone]     Social Determinants of Health (SDOH) Interventions    Readmission Risk Interventions No flowsheet data found.

## 2021-01-11 NOTE — Discharge Instructions (Signed)

## 2021-01-11 NOTE — Progress Notes (Signed)
SATURATION QUALIFICATIONS:   Patient Saturations on Room Air at Rest = 96% Patient Saturations on Room Air while Ambulating = 94% Patient Saturations on 2 Liters of oxygen while Ambulating = N/A  Please briefly explain why patient needs home oxygen: N/A

## 2021-01-11 NOTE — Discharge Summary (Signed)
Physician Discharge Summary  [name] ENI:[id number] DOB: 07-Jul-1941 DOA: 01/09/2021  PCP: Karen Flow, MD  Admit date: 01/09/2021 Discharge date: 01/11/2021  Admitted From: Home Disposition:  Home  Recommendations for Outpatient Follow-up:  Follow up with PCP in 1 week Follow up with Nephrology as originally scheduled Please obtain BMP/CBC in 1 week to ensure stable Na and Hb  Home Health: PT  Equipment/Devices: O2  Discharge Condition: Good CODE STATUS: Full  Diet recommendation: General  Brief/Interim Summary: From H&P: From Dr. Guilford Wagner "[name] is a 80 y.o. female with history h/o HTN, HLP, DM, ESRD  on HD MWF , A fib on eliquis, Chr GI bleed with Chr anemia previous transfusions, presented to [facility] with SOB, abd bloating, hypoxic-placed on 4 lits 02 , CXR -edema. Hgb 6.8->received 1 unit PRBC (posttransfusion level apparently 7.1)-accepted by Dr [name] for [facility], transferred ED-ED as no beds, nephrology consulted, repeat labs ordered here.  Patient denies any history of melena or hematochezia although reports chronic dark stools as she is on iron supplementation along with related constipation.  She is currently saturating well on 4 L O2 and denies any complaints of chest pain or dyspnea.  She states she and her husband have been in the process of completing advanced directive and that she would not want to be kept alive on machines for too long but would like resuscitative measures to be pursued in an emergent situation.  She is agreeable for repeat transfusion as needed and hemodialysis here"  Interim: Underwent hemodialysis on 6/17 and 6/18.  She felt much better after treatments.  Subjective on day of discharge: She is still requiring 1 L of oxygen but is ready to go home.  She declined SNF as recommended by physical therapy.  She is amenable to outpatient physical therapy.  Discharge Diagnoses:  Principal Problem:   Acute respiratory failure with hypoxia  (HCC) Active Problems:   Essential hypertension   DM type 2 (diabetes mellitus, type 2) ([place])   ESRD on hemodialysis ([place])   Anemia   Acute pulmonary edema [facility])   Discharge Instructions  Discharge Instructions     Call MD for:  difficulty breathing, headache or visual disturbances   Complete by: As directed    Diet - low sodium heart healthy   Complete by: As directed    Increase activity slowly   Complete by: As directed    No wound care   Complete by: As directed       Allergies as of 01/11/2021   No Known Allergies      Medication List     STOP taking these medications    hydrALAZINE 25 MG tablet Commonly known as: APRESOLINE       TAKE these medications    amiodarone 200 MG tablet Commonly known as: PACERONE Take 1 tablet (200 mg total) by mouth daily.   apixaban 2.5 MG Tabs tablet Commonly known as: ELIQUIS Take 1 tablet (2.5 mg total) by mouth 2 (two) times daily.   ascorbic acid 500 MG tablet Commonly known as: VITAMIN C Take 500 mg by mouth 2 (two) times daily.   atorvastatin 20 MG tablet Commonly known as: LIPITOR Take 20 mg by mouth at bedtime.   diltiazem 120 MG 24 hr capsule Commonly known as: CARDIZEM CD Take 1 capsule (120 mg total) by mouth daily.   Insulin Degludec-Liraglutide 100-3.6 UNIT-MG/ML Sopn Commonly known as: XULTOPHY Inject 26 Units into the skin every morning.   letrozole  2.5 MG tablet Commonly known as: FEMARA Take 1 tablet (2.5 mg total) by mouth at bedtime.   Linzess 72 MCG capsule Generic drug: linaclotide Take 72 mcg by mouth at bedtime.   pantoprazole 40 MG tablet Commonly known as: PROTONIX Take 1 tablet (40 mg total) by mouth 2 (two) times daily.   sevelamer carbonate 800 MG tablet Commonly known as: RENVELA Take 800 mg by mouth daily.   Vitamin D3 25 MCG (1000 UT) Caps Take 2,000 Units by mouth daily.   zinc sulfate 220 (50 Zn) MG capsule Take 220 mg by mouth 2 (two) times daily.                Durable Medical Equipment  (From admission, onward)           Start     Ordered   01/11/21 1512  DME Oxygen  Once       Question Answer Comment  Length of Need 6 Months   Mode or (Route) Nasal cannula   Frequency Continuous (stationary and portable oxygen unit needed)   Oxygen delivery system Gas      01/11/21 1512            No Known Allergies  Consultations: Nephrology   Procedures/Studies: DG CHEST PORT 1 VIEW  Result Date: 01/09/2021 CLINICAL DATA:  Shortness of breath.  Renal failure EXAM: PORTABLE CHEST 1 VIEW COMPARISON:  January 09, 2021 study obtained earlier in the day FINDINGS: Central catheter tip is at the cavoatrial junction. No pneumothorax. There is cardiomegaly with pulmonary venous hypertension. There are pleural effusions bilaterally with interstitial edema, increased from earlier in the day. There is ill-defined airspace opacity in the lung bases as well. There is aortic atherosclerosis. No adenopathy. No bone lesions. IMPRESSION: Central catheter as described without pneumothorax. Cardiomegaly with pulmonary vascular congestion. Increased interstitial edema with suspected alveolar edema in the bases and pleural effusions. Appearance overall consistent with congestive heart failure. Electronically Signed   By: Lowella Grip Wagner M.D.   On: 01/09/2021 09:13   VAS [place] UPPER EXTREMITY ARTERIAL DUPLEX  Result Date: 12/30/2020  UPPER EXTREMITY DUPLEX STUDY Patient Name:  [name]  Date of Exam:   12/30/2020 Medical Rec #: [id number]      Accession #:    [id number] Date of Birth: 02-09-1941       Patient Gender: F Patient Age:   80Y Exam Location:  [facility] Procedure:      VAS [place] UPPER EXTREMITY ARTERIAL DUPLEX Referring Phys: [id number] [name] --------------------------------------------------------------------------------  Indications: Per-operative exam.  Risk Factors: Hypertension, Diabetes, prior CVA. Limitations: Exam  performed in [facility], Unable to properly position patient Performing Technologist: [name] RVT  Examination Guidelines: A complete evaluation includes B-mode imaging, spectral Doppler, color Doppler, and power Doppler as needed of all accessible portions of each vessel. Bilateral testing is considered an integral part of a complete examination. Limited examinations for reoccurring indications may be performed as noted.  Right Pre-Dialysis Findings: +-----------------------+----------+--------------------+--------+---------+ Location               PSV (cm/s)Intralum. Diam. (cm)WaveformComments  +-----------------------+----------+--------------------+--------+---------+ Brachial Antecub. fossa83        0.46                biphasic          +-----------------------+----------+--------------------+--------+---------+ Radial Art at Wrist    80        0.27  biphasic          +-----------------------+----------+--------------------+--------+---------+ Ulnar Art at Wrist     39        0.08                biphasictoo small +-----------------------+----------+--------------------+--------+---------+  Left Pre-Dialysis Findings: +-----------------------+----------+--------------------+----------+---------+ Location               PSV (cm/s)Intralum. Diam. (cm)Waveform  Comments  +-----------------------+----------+--------------------+----------+---------+ Brachial Antecub. fossa85        0.43                triphasic           +-----------------------+----------+--------------------+----------+---------+ Radial Art at Wrist    111       0.23                biphasic            +-----------------------+----------+--------------------+----------+---------+ Ulnar Art at Wrist     10        1.00                monophasictoo small +-----------------------+----------+--------------------+----------+---------+  Summary:   Measurements above. *See table(s) above for  measurements and observations. Electronically signed by Jamelle Haring on 12/30/2020 at 1:43:41 PM.    Final    VAS [place] UPPER EXTREMITY VEIN MAPPING  Result Date: 12/30/2020 UPPER EXTREMITY VEIN MAPPING Patient Name:  TAJUANA KNISKERN  Date of Exam:   12/30/2020 Medical Rec #: [id number]      Accession #:    [id number] Date of Birth: [birth year]       Patient Gender: F Patient Age:   80Y Exam Location:  [facility] Procedure:      VAS [place] UPPER EXT VEIN MAPPING (PRE-OP AVF) Referring Phys: [id number] [name] --------------------------------------------------------------------------------  Indications: Pre-access. Limitations: Exam performed in WC. Movement. Unable to properly position              patient. Performing Technologist: [name] RVT  Examination Guidelines: A complete evaluation includes B-mode imaging, spectral Doppler, color Doppler, and power Doppler as needed of all accessible portions of each vessel. Bilateral testing is considered an integral part of a complete examination. Limited examinations for reoccurring indications may be performed as noted. +-----------------+-------------+----------+-------------+ Right Cephalic   Diameter (cm)Depth (cm)  Findings    +-----------------+-------------+----------+-------------+ Prox upper arm       0.13                             +-----------------+-------------+----------+-------------+ Mid upper arm        0.14                             +-----------------+-------------+----------+-------------+ Dist upper arm       0.18                             +-----------------+-------------+----------+-------------+ Antecubital fossa    0.30                             +-----------------+-------------+----------+-------------+ Prox forearm         0.18                             +-----------------+-------------+----------+-------------+ Mid forearm  0.15                              +-----------------+-------------+----------+-------------+ Dist forearm         0.13               mid to distal +-----------------+-------------+----------+-------------+ +-----------------+-------------+----------+---------+ Right Basilic    Diameter (cm)Depth (cm)Findings  +-----------------+-------------+----------+---------+ Prox upper arm       0.31                         +-----------------+-------------+----------+---------+ Mid upper arm        0.27                         +-----------------+-------------+----------+---------+ Dist upper arm       0.26               branching +-----------------+-------------+----------+---------+ Antecubital fossa 0.21 / 0.15                     +-----------------+-------------+----------+---------+ Prox forearm                            too small +-----------------+-------------+----------+---------+ +-----------------+-------------+----------+--------+ Left Cephalic    Diameter (cm)Depth (cm)Findings +-----------------+-------------+----------+--------+ Shoulder             0.16                        +-----------------+-------------+----------+--------+ Prox upper arm       0.16                        +-----------------+-------------+----------+--------+ Mid upper arm        0.13                        +-----------------+-------------+----------+--------+ Dist upper arm       0.18                        +-----------------+-------------+----------+--------+ Antecubital fossa    0.26                        +-----------------+-------------+----------+--------+ Prox forearm         0.20                        +-----------------+-------------+----------+--------+ Mid forearm          0.11                        +-----------------+-------------+----------+--------+ Dist forearm         0.11                        +-----------------+-------------+----------+--------+  +-----------------+-------------+----------+--------+ Left Basilic     Diameter (cm)Depth (cm)Findings +-----------------+-------------+----------+--------+ Prox upper arm       0.24                        +-----------------+-------------+----------+--------+ Mid upper arm        0.23                        +-----------------+-------------+----------+--------+ Dist upper arm  0.19 / 0.13                    +-----------------+-------------+----------+--------+ Antecubital fossa    0.27                        +-----------------+-------------+----------+--------+ Prox forearm         0.13                        +-----------------+-------------+----------+--------+ Summary:   Measurements aboe. *See table(s) above for measurements and observations.  Diagnosing physician: Jamelle Haring Electronically signed by Jamelle Haring on 12/30/2020 at 1:43:21 PM.    Final      Discharge Exam: Vitals:   01/11/21 0558 01/11/21 1042  BP: (!) 146/72 (!) 141/75  Pulse: 80 82  Resp: 18 18  Temp: 98.2 F (36.8 C) 97.8 F (36.6 C)  SpO2: 96% 97%    General: Pt is alert, awake, not in acute distress Cardiovascular: RRR, S1/S2 +, no edema Respiratory: CTA bilaterally, no wheezing, no rhonchi, no respiratory distress, no conversational dyspnea  Abdominal: Soft, NT, ND, bowel sounds + Extremities: no edema, no cyanosis Psych: Normal mood and affect, stable judgement and insight     The results of significant diagnostics from this hospitalization (including imaging, microbiology, ancillary and laboratory) are listed below for reference.     Microbiology: Recent Results (from the past 240 hour(s))  SARS CORONAVIRUS 2 (TAT 6-24 HRS) Nasopharyngeal Nasopharyngeal Swab     Status: None   Collection Time: 01/09/21 10:25 AM   Specimen: Nasopharyngeal Swab  Result Value Ref Range Status   SARS Coronavirus 2 NEGATIVE NEGATIVE Final    Comment: (NOTE) SARS-CoV-2 target nucleic acids are  NOT DETECTED.  The SARS-CoV-2 RNA is generally detectable in upper and lower respiratory specimens during the acute phase of infection. Negative results do not preclude SARS-CoV-2 infection, do not rule out co-infections with other pathogens, and should not be used as the sole basis for treatment or other patient management decisions. Negative results must be combined with clinical observations, patient history, and epidemiological information. The expected result is Negative.  Fact Sheet for Patients: SugarRoll.be  Fact Sheet for Healthcare Providers: https://www.woods-mathews.com/  This test is not yet approved or cleared by the [place] FDA and  has been authorized for detection and/or diagnosis of SARS-CoV-2 by FDA under an Emergency Use Authorization (EUA). This EUA will remain  in effect (meaning this test can be used) for the duration of the COVID-19 declaration under Se ction 564(b)(1) of the Act, 21 U.S.C. section 360bbb-3(b)(1), unless the authorization is terminated or revoked sooner.  Performed at [facility], [place] [address], [place], [place] [address]      Labs: BNP (last 3 results) Recent Labs    07/17/20 0152 07/18/20 0050 07/19/20 0200  BNP 226.6* 179.8* 741.2*   Basic Metabolic Panel: Recent Labs  Lab 01/09/21 0829 01/10/21 0259 01/11/21 0303  NA 134* 135 134*  K 3.6 3.5 3.9  CL 92* 96* 93*  CO2 28 28 27   GLUCOSE 87 143* 164*  BUN 31* 16 36*  CREATININE 4.28* 2.80* 4.55*  CALCIUM 8.8* 9.0 9.7  PHOS 3.7  --  3.1   Liver Function Tests: Recent Labs  Lab 01/09/21 0829 01/11/21 0303  ALBUMIN 2.1* 2.2*   CBC: Recent Labs  Lab 01/09/21 0829 01/10/21 0259 01/11/21 0303  WBC 10.1 8.1 6.9  NEUTROABS 8.6*  --   --  HGB 6.9* 8.5* 9.2*  HCT 22.9* 26.5* 29.2*  MCV 85.4 84.9 84.9  PLT 432* 391 385    Recent Labs  Lab 01/10/21 1148 01/10/21 1700 01/10/21 2022 01/11/21 0658  01/11/21 1124  GLUCAP 184* 186* 206* 144* 225*   Hgb A1c Recent Labs    01/09/21 1025  HGBA1C 5.6   Microbiology Recent Results (from the past 240 hour(s))  SARS CORONAVIRUS 2 (TAT 6-24 HRS) Nasopharyngeal Nasopharyngeal Swab     Status: None   Collection Time: 01/09/21 10:25 AM   Specimen: Nasopharyngeal Swab  Result Value Ref Range Status   SARS Coronavirus 2 NEGATIVE NEGATIVE Final    Comment: (NOTE) SARS-CoV-2 target nucleic acids are NOT DETECTED.  The SARS-CoV-2 RNA is generally detectable in upper and lower respiratory specimens during the acute phase of infection. Negative results do not preclude SARS-CoV-2 infection, do not rule out co-infections with other pathogens, and should not be used as the sole basis for treatment or other patient management decisions. Negative results must be combined with clinical observations, patient history, and epidemiological information. The expected result is Negative.  Fact Sheet for Patients: SugarRoll.be  Fact Sheet for Healthcare Providers: https://www.woods-mathews.com/  This test is not yet approved or cleared by the [place] FDA and  has been authorized for detection and/or diagnosis of SARS-CoV-2 by FDA under an Emergency Use Authorization (EUA). This EUA will remain  in effect (meaning this test can be used) for the duration of the COVID-19 declaration under Se ction 564(b)(1) of the Act, 21 U.S.C. section 360bbb-3(b)(1), unless the authorization is terminated or revoked sooner.  Performed at [facility], [place] [address], [place], [place] [address]      Patient was seen and examined on the day of discharge and was found to be in stable condition. Time coordinating discharge: 25 minutes including assessment and coordination of care, as well as examination of the patient.   SIGNED:  Shelda Pal, DO [facility] 01/11/2021, 3:12 PM

## 2021-01-11 NOTE — Evaluation (Addendum)
Physical Therapy Evaluation Patient Details Name: [name] MRN: [id number] DOB: Dec 16, 1940 Today's Date: 01/11/2021   History of Present Illness  Pt is a 80 y/o female presenting to ED from [facility] for SOB, abd bloating, hypoxia. CXR reveals edema. Pt admitted for acute respiratory failure with hypoxia. PMH: HTN, HLP, DM, ESRD on HD MWF , A fib on eliquis, Chr GI bleed with Chr anemia previous transfusions.  Clinical Impression  Pt demonstrates deficits in functional mobility, balance, gait, activity tolerance, sensation, and strength. Pt requires physical assistance to decelerate momentum  anteriorly when rising to stand, to prevent fall. Pt tolerates ambulation for household distances with noted instability of L LE> R LE. Pt's sensation and strength deficits increase falls risk, raising concern for pt safety. Pt will benefit from continued acute PT services to reduce falls risk and improve balance with functional mobility. SPT recommends SNF placement to provide level of assist necessary and decrease risk of falls, while improve independence with mobility.     Follow Up Recommendations SNF;Supervision for mobility/OOB (HHPT with supervision for all OOB mobility if pt refuses SNF)    Equipment Recommendations  3in1 (PT);Wheelchair (measurements PT);Wheelchair cushion (measurements PT)    Recommendations for Other Services       Precautions / Restrictions Precautions Precautions: Fall Restrictions Weight Bearing Restrictions: No      Mobility  Bed Mobility Overal bed mobility: Needs Assistance Bed Mobility: Supine to Sit     Supine to sit: Min assist;HOB elevated     General bed mobility comments: min A + HHA to pull up to sit    Transfers Overall transfer level: Needs assistance Equipment used: Rolling walker (2 wheeled) Transfers: Sit to/from Stand (x 3) Sit to Stand: Min guard;Min assist         General transfer comment: Pt requires min A to decelerate trunk when  standing, experiencing LOB on initaly attempt with min A to restore balance. Pt min G on final attempt sit>stand.  Ambulation/Gait Ambulation/Gait assistance: Min guard;Min assist Gait Distance (Feet): 10 Feet (x 2 bouts) Assistive device: Rolling walker (2 wheeled) Gait Pattern/deviations: Step-through pattern;Decreased stride length;Decreased weight shift to left;Trunk flexed Gait velocity: reduced Gait velocity interpretation: <1.31 ft/sec, indicative of household ambulator General Gait Details: Pt demonstrates slow step-through gait speed with instability L LE> R LE. min A to maintain balance progressing to min G.  Stairs            Wheelchair Mobility    Modified Rankin (Stroke Patients Only)       Balance Overall balance assessment: Needs assistance Sitting-balance support: Feet supported Sitting balance-Leahy Scale: Fair Sitting balance - Comments: Pt initally leaning posteriorly, losing balance without UE support. Pt able to maintain sitting balance later in session without UE support   Standing balance support: During functional activity;Bilateral upper extremity supported Standing balance-Leahy Scale: Poor Standing balance comment: Pt requires UE support and external support to stand and maintain balance                             Pertinent Vitals/Pain Pain Assessment: No/denies pain    Home Living Family/patient expects to be discharged to:: Private residence Living Arrangements: Spouse/significant other;Other relatives Available Help at Discharge: Family;Available PRN/intermittently (Pt reports daughter can be available when necessary) Type of Home: House Home Access: Ramped entrance     Home Layout: One level Home Equipment: [place] held shower head;Grab bars - tub/shower;Grab bars - toilet;Walker -  4 wheels;Hospital bed;Toilet riser;Other (comment) (Pt reports recently ordering lift chair but won't be receiving until August.)       Prior Function Level of Independence: Independent with assistive device(s)         Comments: Pt uses rollator for mobilityin the home and to mailbox. Pt requires supervision/assist to get into the shower, but no assist once in shower.     Hand Dominance        Extremity/Trunk Assessment   Upper Extremity Assessment Upper Extremity Assessment: Overall WFL for tasks assessed    Lower Extremity Assessment Lower Extremity Assessment: Generalized weakness;LLE deficits/detail;RLE deficits/detail RLE Deficits / Details: Pt with decreased sensation to light touch bil feet. Pt with decreased strength of ankle DF, 3+/5. RLE Sensation: history of peripheral neuropathy;decreased light touch LLE Deficits / Details: Pt with decreased sensation to light touch bil feet. Pt with strength deficits in L hip, 3+/5. LLE Sensation: history of peripheral neuropathy;decreased light touch       Communication   Communication: No difficulties  Cognition Arousal/Alertness: Awake/alert Behavior During Therapy: WFL for tasks assessed/performed Overall Cognitive Status: Within Functional Limits for tasks assessed                                        General Comments General comments (skin integrity, edema, etc.): Pt initally on RA with sats between 88-90%. Pt placed on 1 L and maintaining o2 sats at or above 95% at rest.    Exercises     Assessment/Plan    PT Assessment Patient needs continued PT services  PT Problem List Decreased strength;Decreased balance;Decreased activity tolerance;Decreased mobility;Decreased knowledge of use of DME;Decreased safety awareness;Decreased knowledge of precautions;Impaired sensation       PT Treatment Interventions DME instruction;Gait training;Functional mobility training;Therapeutic activities;Therapeutic exercise;Balance training;Patient/family education    PT Goals (Current goals can be found in the Care Plan section)  Acute Rehab PT  Goals Patient Stated Goal: get stronger and go home PT Goal Formulation: With patient Time For Goal Achievement: 01/25/21 Potential to Achieve Goals: Good    Frequency Min 3X/week   Barriers to discharge        Co-evaluation               AM-PAC PT "6 Clicks" Mobility  Outcome Measure Help needed turning from your back to your side while in a flat bed without using bedrails?: None Help needed moving from lying on your back to sitting on the side of a flat bed without using bedrails?: A Little Help needed moving to and from a bed to a chair (including a wheelchair)?: A Little Help needed standing up from a chair using your arms (e.g., wheelchair or bedside chair)?: A Little Help needed to walk in hospital room?: A Little Help needed climbing 3-5 steps with a railing? : A Lot 6 Click Score: 18    End of Session Equipment Utilized During Treatment: Gait belt;Oxygen Activity Tolerance: Patient limited by fatigue Patient left: in chair;with call bell/phone within reach;with chair alarm set Nurse Communication: Mobility status;Other (comment) (o2 sats) PT Visit Diagnosis: Unsteadiness on feet (R26.81);Other abnormalities of gait and mobility (R26.89);Muscle weakness (generalized) (M62.81);Difficulty in walking, not elsewhere classified (R26.2)    Time: 0626-9485 PT Time Calculation (min) (ACUTE ONLY): 46 min   Charges:   PT Evaluation $PT Eval Low Complexity: 1 Low PT Treatments $Gait Training: 8-22 mins  Acute Rehab  Pager: [phone]   Garwin Brothers, SPT  01/11/2021, 10:17 AM

## 2021-01-11 NOTE — Progress Notes (Signed)
PROGRESS NOTE    CHANLER SCHREITER  IRW:[id number] DOB: 08-08-1940 DOA: 01/09/2021 PCP: [name], MD     Brief Narrative:  Patient is a 80 year old female with a history of end-stage renal disease on hemodialysis MWF, hypertension, diabetes, hyperlipidemia, A. fib who presented to [facility] with shortness of breath and bloating.  She had hypoxia and was placed on 4 L of oxygen.  Chest x-ray showed edema.  Her hemoglobin was 6.8 and she received 1 unit of blood.  Repeat was also low and she received another unit.  She had hemodialysis on 6/17 as well and noted some improvement in her breathing.  New events last 24 hours / Subjective: After dialysis yesterday afternoon, she noted improvement in her breathing.  She is interested in going home but feels weak.  No chest pain, shortness of breath, wheezing, or coughing.  She is eating and drinking well.  She no longer needs oxygen.  Hemoglobin has been stable.  Assessment & Plan:   Principal Problem:   Acute respiratory failure with hypoxia (HCC)  Resolved, no longer on supplemental oxygen  Active Problems:   Essential hypertension  Continue Cardizem 120 mg daily  Hydralazine as needed   DM type 2 (diabetes mellitus, type 2) (HCC)  Using home insulin liraglutide combination  Sliding scale insulin    ESRD on hemodialysis (HCC)  Hemodialysis tomorrow, appreciate nephrology    Anemia  Stable, continue to monitor CBC    Acute pulmonary edema (HCC)  Likely resolved    DVT prophylaxis: On Eliquis Code Status: Full Family Communication: Self Coming From: Home Disposition Plan: SNF Barriers to Discharge: SNF placement  Consultants:  Nephrology  Procedures:  HD 6/17, 01/10/21   Objective: Vitals:   01/10/21 1620 01/10/21 1921 01/11/21 0558 01/11/21 1042  BP: 136/67 135/68 (!) 146/72 (!) 141/75  Pulse: 87 81 80 82  Resp:  17 18 18   Temp:  98.4 F (36.9 C) 98.2 F (36.8 C) 97.8 F (36.6 C)  TempSrc:  Oral  Oral   SpO2:  95% 96% 97%  Weight:      Height:        Intake/Output Summary (Last 24 hours) at 01/11/2021 1148 Last data filed at 01/10/2021 1700 Gross per 24 hour  Intake 300 ml  Output 3000 ml  Net -2700 ml   Filed Weights   01/09/21 1650 01/10/21 1300 01/10/21 1608  Weight: 68.4 kg 67.5 kg 64.5 kg    Examination:  General exam: Appears calm and comfortable  Respiratory system: Clear to auscultation. Respiratory effort normal. No respiratory distress. No conversational dyspnea.  Cardiovascular system: S1 & S2 heard, RRR. No murmurs. No pedal edema. Gastrointestinal system: Abdomen is nondistended, soft and nontender. Normal bowel sounds heard. Central nervous system: Alert and oriented. No focal neurological deficits. Speech clear.  Extremities: Symmetric in appearance  Skin: No rashes, lesions or ulcers on exposed skin  Psychiatry: Judgement and insight appear normal. Mood & affect appropriate.   Data Reviewed: I have personally reviewed following labs and imaging studies  CBC: Recent Labs  Lab 01/09/21 0829 01/10/21 0259 01/11/21 0303  WBC 10.1 8.1 6.9  NEUTROABS 8.6*  --   --   HGB 6.9* 8.5* 9.2*  HCT 22.9* 26.5* 29.2*  MCV 85.4 84.9 84.9  PLT 432* 391 761   Basic Metabolic Panel: Recent Labs  Lab 01/09/21 0829 01/10/21 0259 01/11/21 0303  NA 134* 135 134*  K 3.6 3.5 3.9  CL 92* 96* 93*  CO2  28 28 27   GLUCOSE 87 143* 164*  BUN 31* 16 36*  CREATININE 4.28* 2.80* 4.55*  CALCIUM 8.8* 9.0 9.7  PHOS 3.7  --  3.1   GFR: Estimated Creatinine Clearance: 8.7 mL/min (A) (by C-G formula based on SCr of 4.55 mg/dL (H)).  Liver Function Tests: Recent Labs  Lab 01/09/21 0829 01/11/21 0303  ALBUMIN 2.1* 2.2*    HbA1C: Recent Labs    01/09/21 1025  HGBA1C 5.6   CBG: Recent Labs  Lab 01/10/21 1148 01/10/21 1700 01/10/21 2022 01/11/21 0658 01/11/21 1124  GLUCAP 184* 186* 206* 144* 225*   Recent Results (from the past 240 hour(s))  SARS CORONAVIRUS 2  (TAT 6-24 HRS) Nasopharyngeal Nasopharyngeal Swab     Status: None   Collection Time: 01/09/21 10:25 AM   Specimen: Nasopharyngeal Swab  Result Value Ref Range Status   SARS Coronavirus 2 NEGATIVE NEGATIVE Final    Comment: (NOTE) SARS-CoV-2 target nucleic acids are NOT DETECTED.  The SARS-CoV-2 RNA is generally detectable in upper and lower respiratory specimens during the acute phase of infection. Negative results do not preclude SARS-CoV-2 infection, do not rule out co-infections with other pathogens, and should not be used as the sole basis for treatment or other patient management decisions. Negative results must be combined with clinical observations, patient history, and epidemiological information. The expected result is Negative.  Fact Sheet for Patients: SugarRoll.be  Fact Sheet for Healthcare Providers: https://www.woods-mathews.com/  This test is not yet approved or cleared by the [place] FDA and  has been authorized for detection and/or diagnosis of SARS-CoV-2 by FDA under an Emergency Use Authorization (EUA). This EUA will remain  in effect (meaning this test can be used) for the duration of the COVID-19 declaration under Se ction 564(b)(1) of the Act, 21 U.S.C. section 360bbb-3(b)(1), unless the authorization is terminated or revoked sooner.  Performed at [facility], [place] [address] [place], [place] [address]       Radiology Studies: No results found.   Scheduled Meds:  (feeding supplement) PROSource Plus  30 mL Oral BID BM   sodium chloride   Intravenous Once   amiodarone  200 mg Oral Daily   apixaban  2.5 mg Oral BID   vitamin C  500 mg Oral BID   atorvastatin  20 mg Oral QHS   Chlorhexidine Gluconate Cloth  6 each Topical Q0600   cholecalciferol  2,000 Units Oral Daily   diltiazem  120 mg Oral Daily   insulin aspart  0-6 Units Subcutaneous TID WC   Insulin Degludec-Liraglutide  26 Units  Subcutaneous q morning   letrozole  2.5 mg Oral Daily   pantoprazole  40 mg Oral BID   sevelamer carbonate  800 mg Oral TID WC   Continuous Infusions:   LOS: 1 day    Time spent: 25 minutes   Shelda Pal, DO [facility] 01/11/2021, 11:48 AM   Available via Epic secure chat 7am-7pm After these hours, please refer to coverage provider listed on [email or web address]

## 2021-01-12 DIAGNOSIS — Z992 Dependence on renal dialysis: Secondary | ICD-10-CM | POA: Diagnosis not present

## 2021-01-12 DIAGNOSIS — N186 End stage renal disease: Secondary | ICD-10-CM | POA: Diagnosis not present

## 2021-01-12 DIAGNOSIS — N2581 Secondary hyperparathyroidism of renal origin: Secondary | ICD-10-CM | POA: Diagnosis not present

## 2021-01-13 DIAGNOSIS — N186 End stage renal disease: Secondary | ICD-10-CM | POA: Diagnosis not present

## 2021-01-13 DIAGNOSIS — J9601 Acute respiratory failure with hypoxia: Secondary | ICD-10-CM | POA: Diagnosis not present

## 2021-01-13 DIAGNOSIS — J180 Bronchopneumonia, unspecified organism: Secondary | ICD-10-CM | POA: Diagnosis not present

## 2021-01-13 DIAGNOSIS — E1122 Type 2 diabetes mellitus with diabetic chronic kidney disease: Secondary | ICD-10-CM | POA: Diagnosis not present

## 2021-01-13 DIAGNOSIS — I120 Hypertensive chronic kidney disease with stage 5 chronic kidney disease or end stage renal disease: Secondary | ICD-10-CM | POA: Diagnosis not present

## 2021-01-13 DIAGNOSIS — E785 Hyperlipidemia, unspecified: Secondary | ICD-10-CM | POA: Diagnosis not present

## 2021-01-13 DIAGNOSIS — Z992 Dependence on renal dialysis: Secondary | ICD-10-CM | POA: Diagnosis not present

## 2021-01-13 DIAGNOSIS — K5900 Constipation, unspecified: Secondary | ICD-10-CM | POA: Diagnosis not present

## 2021-01-13 DIAGNOSIS — D631 Anemia in chronic kidney disease: Secondary | ICD-10-CM | POA: Diagnosis not present

## 2021-01-13 DIAGNOSIS — I480 Paroxysmal atrial fibrillation: Secondary | ICD-10-CM | POA: Diagnosis not present

## 2021-01-14 DIAGNOSIS — N186 End stage renal disease: Secondary | ICD-10-CM | POA: Diagnosis not present

## 2021-01-14 DIAGNOSIS — N2581 Secondary hyperparathyroidism of renal origin: Secondary | ICD-10-CM | POA: Diagnosis not present

## 2021-01-14 DIAGNOSIS — Z992 Dependence on renal dialysis: Secondary | ICD-10-CM | POA: Diagnosis not present

## 2021-01-14 NOTE — TOC Transition Note (Signed)
Transition of care contact from inpatient facility  Date of discharge: 6/19 Date of contact: 6/20 Method: Phone Spoke to: Patient  Patient contacted to discuss transition of care from recent inpatient hospitalization. Patient was admitted to [facility] from 01/09/21-01/11/21 with discharge diagnosis of acute respiratory failure with hypoxia/chronic GIB.  Medication changes were reviewed. Hydralazine was discontinued during this hospitalization. She reports scheduling her appointment with her PCP and how Home Health PT was set up.  Patient will follow up with his/her outpatient HD unit on: 01/12/21 at [place], NP

## 2021-01-16 DIAGNOSIS — E785 Hyperlipidemia, unspecified: Secondary | ICD-10-CM | POA: Diagnosis not present

## 2021-01-16 DIAGNOSIS — E1122 Type 2 diabetes mellitus with diabetic chronic kidney disease: Secondary | ICD-10-CM | POA: Diagnosis not present

## 2021-01-16 DIAGNOSIS — Z992 Dependence on renal dialysis: Secondary | ICD-10-CM | POA: Diagnosis not present

## 2021-01-16 DIAGNOSIS — I120 Hypertensive chronic kidney disease with stage 5 chronic kidney disease or end stage renal disease: Secondary | ICD-10-CM | POA: Diagnosis not present

## 2021-01-16 DIAGNOSIS — J9601 Acute respiratory failure with hypoxia: Secondary | ICD-10-CM | POA: Diagnosis not present

## 2021-01-16 DIAGNOSIS — D631 Anemia in chronic kidney disease: Secondary | ICD-10-CM | POA: Diagnosis not present

## 2021-01-16 DIAGNOSIS — N2581 Secondary hyperparathyroidism of renal origin: Secondary | ICD-10-CM | POA: Diagnosis not present

## 2021-01-16 DIAGNOSIS — N186 End stage renal disease: Secondary | ICD-10-CM | POA: Diagnosis not present

## 2021-01-16 DIAGNOSIS — I480 Paroxysmal atrial fibrillation: Secondary | ICD-10-CM | POA: Diagnosis not present

## 2021-01-16 DIAGNOSIS — K5900 Constipation, unspecified: Secondary | ICD-10-CM | POA: Diagnosis not present

## 2021-01-17 NOTE — Progress Notes (Signed)
Received message from patient's daughter, Simonne Martinet [phone]. Message left on Tuesday. Amy acknowledged in message that she is aware that CM would not be back until Saturday, but requested call back when available. Returned call this morning to Amy. Advised that CM had arranged for home health services. Amy asked if CM was aware of patient having signed a DNR d/t patient having stated that she had signed one. Amy stated that patient has a living will. Amy advised that CM was not aware of patient having signed a DNR and there was no apparent evidence in the chart. Amy expressed appreciation for call back.   Manya Silvas, MSN RN CCM Transitions of Care [phone]

## 2021-01-19 DIAGNOSIS — N2581 Secondary hyperparathyroidism of renal origin: Secondary | ICD-10-CM | POA: Diagnosis not present

## 2021-01-19 DIAGNOSIS — Z992 Dependence on renal dialysis: Secondary | ICD-10-CM | POA: Diagnosis not present

## 2021-01-19 DIAGNOSIS — N186 End stage renal disease: Secondary | ICD-10-CM | POA: Diagnosis not present

## 2021-01-20 DIAGNOSIS — E1122 Type 2 diabetes mellitus with diabetic chronic kidney disease: Secondary | ICD-10-CM | POA: Diagnosis not present

## 2021-01-20 DIAGNOSIS — Z992 Dependence on renal dialysis: Secondary | ICD-10-CM | POA: Diagnosis not present

## 2021-01-20 DIAGNOSIS — J9601 Acute respiratory failure with hypoxia: Secondary | ICD-10-CM | POA: Diagnosis not present

## 2021-01-20 DIAGNOSIS — D631 Anemia in chronic kidney disease: Secondary | ICD-10-CM | POA: Diagnosis not present

## 2021-01-20 DIAGNOSIS — K5900 Constipation, unspecified: Secondary | ICD-10-CM | POA: Diagnosis not present

## 2021-01-20 DIAGNOSIS — I120 Hypertensive chronic kidney disease with stage 5 chronic kidney disease or end stage renal disease: Secondary | ICD-10-CM | POA: Diagnosis not present

## 2021-01-20 DIAGNOSIS — I480 Paroxysmal atrial fibrillation: Secondary | ICD-10-CM | POA: Diagnosis not present

## 2021-01-20 DIAGNOSIS — N186 End stage renal disease: Secondary | ICD-10-CM | POA: Diagnosis not present

## 2021-01-20 DIAGNOSIS — E785 Hyperlipidemia, unspecified: Secondary | ICD-10-CM | POA: Diagnosis not present

## 2021-01-21 DIAGNOSIS — N2581 Secondary hyperparathyroidism of renal origin: Secondary | ICD-10-CM | POA: Diagnosis not present

## 2021-01-21 DIAGNOSIS — N186 End stage renal disease: Secondary | ICD-10-CM | POA: Diagnosis not present

## 2021-01-21 DIAGNOSIS — Z992 Dependence on renal dialysis: Secondary | ICD-10-CM | POA: Diagnosis not present

## 2021-01-22 DIAGNOSIS — I120 Hypertensive chronic kidney disease with stage 5 chronic kidney disease or end stage renal disease: Secondary | ICD-10-CM | POA: Diagnosis not present

## 2021-01-22 DIAGNOSIS — N186 End stage renal disease: Secondary | ICD-10-CM | POA: Diagnosis not present

## 2021-01-22 DIAGNOSIS — J9601 Acute respiratory failure with hypoxia: Secondary | ICD-10-CM | POA: Diagnosis not present

## 2021-01-22 DIAGNOSIS — Z992 Dependence on renal dialysis: Secondary | ICD-10-CM | POA: Diagnosis not present

## 2021-01-22 DIAGNOSIS — D631 Anemia in chronic kidney disease: Secondary | ICD-10-CM | POA: Diagnosis not present

## 2021-01-22 DIAGNOSIS — E785 Hyperlipidemia, unspecified: Secondary | ICD-10-CM | POA: Diagnosis not present

## 2021-01-22 DIAGNOSIS — K5900 Constipation, unspecified: Secondary | ICD-10-CM | POA: Diagnosis not present

## 2021-01-22 DIAGNOSIS — I480 Paroxysmal atrial fibrillation: Secondary | ICD-10-CM | POA: Diagnosis not present

## 2021-01-22 DIAGNOSIS — E1122 Type 2 diabetes mellitus with diabetic chronic kidney disease: Secondary | ICD-10-CM | POA: Diagnosis not present

## 2021-01-23 DIAGNOSIS — Z992 Dependence on renal dialysis: Secondary | ICD-10-CM | POA: Diagnosis not present

## 2021-01-23 DIAGNOSIS — N2581 Secondary hyperparathyroidism of renal origin: Secondary | ICD-10-CM | POA: Diagnosis not present

## 2021-01-23 DIAGNOSIS — N186 End stage renal disease: Secondary | ICD-10-CM | POA: Diagnosis not present

## 2021-01-26 DIAGNOSIS — N186 End stage renal disease: Secondary | ICD-10-CM | POA: Diagnosis not present

## 2021-01-26 DIAGNOSIS — Z992 Dependence on renal dialysis: Secondary | ICD-10-CM | POA: Diagnosis not present

## 2021-01-26 DIAGNOSIS — N2581 Secondary hyperparathyroidism of renal origin: Secondary | ICD-10-CM | POA: Diagnosis not present

## 2021-01-27 DIAGNOSIS — Z992 Dependence on renal dialysis: Secondary | ICD-10-CM | POA: Diagnosis not present

## 2021-01-27 DIAGNOSIS — D631 Anemia in chronic kidney disease: Secondary | ICD-10-CM | POA: Diagnosis not present

## 2021-01-27 DIAGNOSIS — E785 Hyperlipidemia, unspecified: Secondary | ICD-10-CM | POA: Diagnosis not present

## 2021-01-27 DIAGNOSIS — K5900 Constipation, unspecified: Secondary | ICD-10-CM | POA: Diagnosis not present

## 2021-01-27 DIAGNOSIS — N186 End stage renal disease: Secondary | ICD-10-CM | POA: Diagnosis not present

## 2021-01-27 DIAGNOSIS — I120 Hypertensive chronic kidney disease with stage 5 chronic kidney disease or end stage renal disease: Secondary | ICD-10-CM | POA: Diagnosis not present

## 2021-01-27 DIAGNOSIS — I480 Paroxysmal atrial fibrillation: Secondary | ICD-10-CM | POA: Diagnosis not present

## 2021-01-27 DIAGNOSIS — J9601 Acute respiratory failure with hypoxia: Secondary | ICD-10-CM | POA: Diagnosis not present

## 2021-01-27 DIAGNOSIS — E1122 Type 2 diabetes mellitus with diabetic chronic kidney disease: Secondary | ICD-10-CM | POA: Diagnosis not present

## 2021-01-28 DIAGNOSIS — N2581 Secondary hyperparathyroidism of renal origin: Secondary | ICD-10-CM | POA: Diagnosis not present

## 2021-01-28 DIAGNOSIS — Z992 Dependence on renal dialysis: Secondary | ICD-10-CM | POA: Diagnosis not present

## 2021-01-28 DIAGNOSIS — N186 End stage renal disease: Secondary | ICD-10-CM | POA: Diagnosis not present

## 2021-01-29 ENCOUNTER — Other Ambulatory Visit: Payer: Self-pay | Admitting: Cardiovascular Disease

## 2021-01-29 DIAGNOSIS — E785 Hyperlipidemia, unspecified: Secondary | ICD-10-CM | POA: Diagnosis not present

## 2021-01-29 DIAGNOSIS — J9601 Acute respiratory failure with hypoxia: Secondary | ICD-10-CM | POA: Diagnosis not present

## 2021-01-29 DIAGNOSIS — I120 Hypertensive chronic kidney disease with stage 5 chronic kidney disease or end stage renal disease: Secondary | ICD-10-CM | POA: Diagnosis not present

## 2021-01-29 DIAGNOSIS — Z992 Dependence on renal dialysis: Secondary | ICD-10-CM | POA: Diagnosis not present

## 2021-01-29 DIAGNOSIS — I480 Paroxysmal atrial fibrillation: Secondary | ICD-10-CM | POA: Diagnosis not present

## 2021-01-29 DIAGNOSIS — N186 End stage renal disease: Secondary | ICD-10-CM | POA: Diagnosis not present

## 2021-01-29 DIAGNOSIS — K5900 Constipation, unspecified: Secondary | ICD-10-CM | POA: Diagnosis not present

## 2021-01-29 DIAGNOSIS — D631 Anemia in chronic kidney disease: Secondary | ICD-10-CM | POA: Diagnosis not present

## 2021-01-29 DIAGNOSIS — E1122 Type 2 diabetes mellitus with diabetic chronic kidney disease: Secondary | ICD-10-CM | POA: Diagnosis not present

## 2021-01-30 DIAGNOSIS — N2581 Secondary hyperparathyroidism of renal origin: Secondary | ICD-10-CM | POA: Diagnosis not present

## 2021-01-30 DIAGNOSIS — N186 End stage renal disease: Secondary | ICD-10-CM | POA: Diagnosis not present

## 2021-01-30 DIAGNOSIS — Z992 Dependence on renal dialysis: Secondary | ICD-10-CM | POA: Diagnosis not present

## 2021-01-30 NOTE — Telephone Encounter (Signed)
Per Dr Gwenlyn Found, due to comorbidities, ok to stay on Eliquis 2.5 BID

## 2021-01-30 NOTE — Telephone Encounter (Signed)
Pt requesting refill of eliquis 2.5 but qualifies for 5 based on age and wt. Routing to pharmd pool. 48f, 75.8kg, scr 4.55 01/11/21, lovw/berry 11/18/20

## 2021-02-02 DIAGNOSIS — N2581 Secondary hyperparathyroidism of renal origin: Secondary | ICD-10-CM | POA: Diagnosis not present

## 2021-02-02 DIAGNOSIS — N186 End stage renal disease: Secondary | ICD-10-CM | POA: Diagnosis not present

## 2021-02-02 DIAGNOSIS — Z992 Dependence on renal dialysis: Secondary | ICD-10-CM | POA: Diagnosis not present

## 2021-02-03 DIAGNOSIS — D631 Anemia in chronic kidney disease: Secondary | ICD-10-CM | POA: Diagnosis not present

## 2021-02-03 DIAGNOSIS — Z992 Dependence on renal dialysis: Secondary | ICD-10-CM | POA: Diagnosis not present

## 2021-02-03 DIAGNOSIS — I120 Hypertensive chronic kidney disease with stage 5 chronic kidney disease or end stage renal disease: Secondary | ICD-10-CM | POA: Diagnosis not present

## 2021-02-03 DIAGNOSIS — N186 End stage renal disease: Secondary | ICD-10-CM | POA: Diagnosis not present

## 2021-02-03 DIAGNOSIS — K5900 Constipation, unspecified: Secondary | ICD-10-CM | POA: Diagnosis not present

## 2021-02-03 DIAGNOSIS — E1122 Type 2 diabetes mellitus with diabetic chronic kidney disease: Secondary | ICD-10-CM | POA: Diagnosis not present

## 2021-02-03 DIAGNOSIS — J9601 Acute respiratory failure with hypoxia: Secondary | ICD-10-CM | POA: Diagnosis not present

## 2021-02-03 DIAGNOSIS — E785 Hyperlipidemia, unspecified: Secondary | ICD-10-CM | POA: Diagnosis not present

## 2021-02-03 DIAGNOSIS — I480 Paroxysmal atrial fibrillation: Secondary | ICD-10-CM | POA: Diagnosis not present

## 2021-02-04 DIAGNOSIS — Z992 Dependence on renal dialysis: Secondary | ICD-10-CM | POA: Diagnosis not present

## 2021-02-04 DIAGNOSIS — N186 End stage renal disease: Secondary | ICD-10-CM | POA: Diagnosis not present

## 2021-02-04 DIAGNOSIS — N2581 Secondary hyperparathyroidism of renal origin: Secondary | ICD-10-CM | POA: Diagnosis not present

## 2021-02-05 DIAGNOSIS — Z992 Dependence on renal dialysis: Secondary | ICD-10-CM | POA: Diagnosis not present

## 2021-02-05 DIAGNOSIS — E785 Hyperlipidemia, unspecified: Secondary | ICD-10-CM | POA: Diagnosis not present

## 2021-02-05 DIAGNOSIS — I120 Hypertensive chronic kidney disease with stage 5 chronic kidney disease or end stage renal disease: Secondary | ICD-10-CM | POA: Diagnosis not present

## 2021-02-05 DIAGNOSIS — N186 End stage renal disease: Secondary | ICD-10-CM | POA: Diagnosis not present

## 2021-02-05 DIAGNOSIS — K5900 Constipation, unspecified: Secondary | ICD-10-CM | POA: Diagnosis not present

## 2021-02-05 DIAGNOSIS — J9601 Acute respiratory failure with hypoxia: Secondary | ICD-10-CM | POA: Diagnosis not present

## 2021-02-05 DIAGNOSIS — E1122 Type 2 diabetes mellitus with diabetic chronic kidney disease: Secondary | ICD-10-CM | POA: Diagnosis not present

## 2021-02-05 DIAGNOSIS — I480 Paroxysmal atrial fibrillation: Secondary | ICD-10-CM | POA: Diagnosis not present

## 2021-02-05 DIAGNOSIS — D631 Anemia in chronic kidney disease: Secondary | ICD-10-CM | POA: Diagnosis not present

## 2021-02-06 DIAGNOSIS — N2581 Secondary hyperparathyroidism of renal origin: Secondary | ICD-10-CM | POA: Diagnosis not present

## 2021-02-06 DIAGNOSIS — N186 End stage renal disease: Secondary | ICD-10-CM | POA: Diagnosis not present

## 2021-02-06 DIAGNOSIS — Z992 Dependence on renal dialysis: Secondary | ICD-10-CM | POA: Diagnosis not present

## 2021-02-09 DIAGNOSIS — N2581 Secondary hyperparathyroidism of renal origin: Secondary | ICD-10-CM | POA: Diagnosis not present

## 2021-02-09 DIAGNOSIS — Z992 Dependence on renal dialysis: Secondary | ICD-10-CM | POA: Diagnosis not present

## 2021-02-09 DIAGNOSIS — N186 End stage renal disease: Secondary | ICD-10-CM | POA: Diagnosis not present

## 2021-02-10 DIAGNOSIS — D631 Anemia in chronic kidney disease: Secondary | ICD-10-CM | POA: Diagnosis not present

## 2021-02-10 DIAGNOSIS — E1122 Type 2 diabetes mellitus with diabetic chronic kidney disease: Secondary | ICD-10-CM | POA: Diagnosis not present

## 2021-02-10 DIAGNOSIS — K5900 Constipation, unspecified: Secondary | ICD-10-CM | POA: Diagnosis not present

## 2021-02-10 DIAGNOSIS — I120 Hypertensive chronic kidney disease with stage 5 chronic kidney disease or end stage renal disease: Secondary | ICD-10-CM | POA: Diagnosis not present

## 2021-02-10 DIAGNOSIS — I480 Paroxysmal atrial fibrillation: Secondary | ICD-10-CM | POA: Diagnosis not present

## 2021-02-10 DIAGNOSIS — J9601 Acute respiratory failure with hypoxia: Secondary | ICD-10-CM | POA: Diagnosis not present

## 2021-02-10 DIAGNOSIS — Z992 Dependence on renal dialysis: Secondary | ICD-10-CM | POA: Diagnosis not present

## 2021-02-10 DIAGNOSIS — N186 End stage renal disease: Secondary | ICD-10-CM | POA: Diagnosis not present

## 2021-02-10 DIAGNOSIS — E785 Hyperlipidemia, unspecified: Secondary | ICD-10-CM | POA: Diagnosis not present

## 2021-02-11 DIAGNOSIS — N2581 Secondary hyperparathyroidism of renal origin: Secondary | ICD-10-CM | POA: Diagnosis not present

## 2021-02-11 DIAGNOSIS — Z992 Dependence on renal dialysis: Secondary | ICD-10-CM | POA: Diagnosis not present

## 2021-02-11 DIAGNOSIS — N186 End stage renal disease: Secondary | ICD-10-CM | POA: Diagnosis not present

## 2021-02-12 ENCOUNTER — Ambulatory Visit: Payer: Medicare HMO | Admitting: Adult Health

## 2021-02-12 DIAGNOSIS — I480 Paroxysmal atrial fibrillation: Secondary | ICD-10-CM | POA: Diagnosis not present

## 2021-02-12 DIAGNOSIS — J9601 Acute respiratory failure with hypoxia: Secondary | ICD-10-CM | POA: Diagnosis not present

## 2021-02-12 DIAGNOSIS — I120 Hypertensive chronic kidney disease with stage 5 chronic kidney disease or end stage renal disease: Secondary | ICD-10-CM | POA: Diagnosis not present

## 2021-02-12 DIAGNOSIS — N186 End stage renal disease: Secondary | ICD-10-CM | POA: Diagnosis not present

## 2021-02-12 DIAGNOSIS — E1122 Type 2 diabetes mellitus with diabetic chronic kidney disease: Secondary | ICD-10-CM | POA: Diagnosis not present

## 2021-02-12 DIAGNOSIS — K5900 Constipation, unspecified: Secondary | ICD-10-CM | POA: Diagnosis not present

## 2021-02-12 DIAGNOSIS — Z992 Dependence on renal dialysis: Secondary | ICD-10-CM | POA: Diagnosis not present

## 2021-02-12 DIAGNOSIS — E785 Hyperlipidemia, unspecified: Secondary | ICD-10-CM | POA: Diagnosis not present

## 2021-02-12 DIAGNOSIS — D631 Anemia in chronic kidney disease: Secondary | ICD-10-CM | POA: Diagnosis not present

## 2021-02-13 DIAGNOSIS — N186 End stage renal disease: Secondary | ICD-10-CM | POA: Diagnosis not present

## 2021-02-13 DIAGNOSIS — N2581 Secondary hyperparathyroidism of renal origin: Secondary | ICD-10-CM | POA: Diagnosis not present

## 2021-02-13 DIAGNOSIS — Z992 Dependence on renal dialysis: Secondary | ICD-10-CM | POA: Diagnosis not present

## 2021-02-16 DIAGNOSIS — Z992 Dependence on renal dialysis: Secondary | ICD-10-CM | POA: Diagnosis not present

## 2021-02-16 DIAGNOSIS — N186 End stage renal disease: Secondary | ICD-10-CM | POA: Diagnosis not present

## 2021-02-16 DIAGNOSIS — N2581 Secondary hyperparathyroidism of renal origin: Secondary | ICD-10-CM | POA: Diagnosis not present

## 2021-02-17 DIAGNOSIS — Z992 Dependence on renal dialysis: Secondary | ICD-10-CM | POA: Diagnosis not present

## 2021-02-17 DIAGNOSIS — D631 Anemia in chronic kidney disease: Secondary | ICD-10-CM | POA: Diagnosis not present

## 2021-02-17 DIAGNOSIS — I120 Hypertensive chronic kidney disease with stage 5 chronic kidney disease or end stage renal disease: Secondary | ICD-10-CM | POA: Diagnosis not present

## 2021-02-17 DIAGNOSIS — K5900 Constipation, unspecified: Secondary | ICD-10-CM | POA: Diagnosis not present

## 2021-02-17 DIAGNOSIS — E1122 Type 2 diabetes mellitus with diabetic chronic kidney disease: Secondary | ICD-10-CM | POA: Diagnosis not present

## 2021-02-17 DIAGNOSIS — I480 Paroxysmal atrial fibrillation: Secondary | ICD-10-CM | POA: Diagnosis not present

## 2021-02-17 DIAGNOSIS — J9601 Acute respiratory failure with hypoxia: Secondary | ICD-10-CM | POA: Diagnosis not present

## 2021-02-17 DIAGNOSIS — N186 End stage renal disease: Secondary | ICD-10-CM | POA: Diagnosis not present

## 2021-02-17 DIAGNOSIS — E785 Hyperlipidemia, unspecified: Secondary | ICD-10-CM | POA: Diagnosis not present

## 2021-02-18 DIAGNOSIS — N186 End stage renal disease: Secondary | ICD-10-CM | POA: Diagnosis not present

## 2021-02-18 DIAGNOSIS — Z992 Dependence on renal dialysis: Secondary | ICD-10-CM | POA: Diagnosis not present

## 2021-02-18 DIAGNOSIS — N2581 Secondary hyperparathyroidism of renal origin: Secondary | ICD-10-CM | POA: Diagnosis not present

## 2021-02-20 DIAGNOSIS — Z992 Dependence on renal dialysis: Secondary | ICD-10-CM | POA: Diagnosis not present

## 2021-02-20 DIAGNOSIS — N186 End stage renal disease: Secondary | ICD-10-CM | POA: Diagnosis not present

## 2021-02-20 DIAGNOSIS — N2581 Secondary hyperparathyroidism of renal origin: Secondary | ICD-10-CM | POA: Diagnosis not present

## 2021-02-22 DIAGNOSIS — Z992 Dependence on renal dialysis: Secondary | ICD-10-CM | POA: Diagnosis not present

## 2021-02-22 DIAGNOSIS — N186 End stage renal disease: Secondary | ICD-10-CM | POA: Diagnosis not present

## 2021-02-22 DIAGNOSIS — E1122 Type 2 diabetes mellitus with diabetic chronic kidney disease: Secondary | ICD-10-CM | POA: Diagnosis not present

## 2021-02-23 DIAGNOSIS — Z992 Dependence on renal dialysis: Secondary | ICD-10-CM | POA: Diagnosis not present

## 2021-02-23 DIAGNOSIS — N2581 Secondary hyperparathyroidism of renal origin: Secondary | ICD-10-CM | POA: Diagnosis not present

## 2021-02-23 DIAGNOSIS — N186 End stage renal disease: Secondary | ICD-10-CM | POA: Diagnosis not present

## 2021-02-24 DIAGNOSIS — J9601 Acute respiratory failure with hypoxia: Secondary | ICD-10-CM | POA: Diagnosis not present

## 2021-02-24 DIAGNOSIS — K5900 Constipation, unspecified: Secondary | ICD-10-CM | POA: Diagnosis not present

## 2021-02-24 DIAGNOSIS — D631 Anemia in chronic kidney disease: Secondary | ICD-10-CM | POA: Diagnosis not present

## 2021-02-24 DIAGNOSIS — E785 Hyperlipidemia, unspecified: Secondary | ICD-10-CM | POA: Diagnosis not present

## 2021-02-24 DIAGNOSIS — I120 Hypertensive chronic kidney disease with stage 5 chronic kidney disease or end stage renal disease: Secondary | ICD-10-CM | POA: Diagnosis not present

## 2021-02-24 DIAGNOSIS — N186 End stage renal disease: Secondary | ICD-10-CM | POA: Diagnosis not present

## 2021-02-24 DIAGNOSIS — Z992 Dependence on renal dialysis: Secondary | ICD-10-CM | POA: Diagnosis not present

## 2021-02-24 DIAGNOSIS — E1122 Type 2 diabetes mellitus with diabetic chronic kidney disease: Secondary | ICD-10-CM | POA: Diagnosis not present

## 2021-02-24 DIAGNOSIS — I480 Paroxysmal atrial fibrillation: Secondary | ICD-10-CM | POA: Diagnosis not present

## 2021-02-25 DIAGNOSIS — N2581 Secondary hyperparathyroidism of renal origin: Secondary | ICD-10-CM | POA: Diagnosis not present

## 2021-02-25 DIAGNOSIS — Z992 Dependence on renal dialysis: Secondary | ICD-10-CM | POA: Diagnosis not present

## 2021-02-25 DIAGNOSIS — N186 End stage renal disease: Secondary | ICD-10-CM | POA: Diagnosis not present

## 2021-02-26 DIAGNOSIS — M85852 Other specified disorders of bone density and structure, left thigh: Secondary | ICD-10-CM | POA: Diagnosis not present

## 2021-02-26 DIAGNOSIS — Z1231 Encounter for screening mammogram for malignant neoplasm of breast: Secondary | ICD-10-CM | POA: Diagnosis not present

## 2021-02-27 DIAGNOSIS — N186 End stage renal disease: Secondary | ICD-10-CM | POA: Diagnosis not present

## 2021-02-27 DIAGNOSIS — N2581 Secondary hyperparathyroidism of renal origin: Secondary | ICD-10-CM | POA: Diagnosis not present

## 2021-02-27 DIAGNOSIS — Z992 Dependence on renal dialysis: Secondary | ICD-10-CM | POA: Diagnosis not present

## 2021-03-02 DIAGNOSIS — N186 End stage renal disease: Secondary | ICD-10-CM | POA: Diagnosis not present

## 2021-03-02 DIAGNOSIS — Z992 Dependence on renal dialysis: Secondary | ICD-10-CM | POA: Diagnosis not present

## 2021-03-02 DIAGNOSIS — N2581 Secondary hyperparathyroidism of renal origin: Secondary | ICD-10-CM | POA: Diagnosis not present

## 2021-03-03 DIAGNOSIS — K5900 Constipation, unspecified: Secondary | ICD-10-CM | POA: Diagnosis not present

## 2021-03-03 DIAGNOSIS — N186 End stage renal disease: Secondary | ICD-10-CM | POA: Diagnosis not present

## 2021-03-03 DIAGNOSIS — E785 Hyperlipidemia, unspecified: Secondary | ICD-10-CM | POA: Diagnosis not present

## 2021-03-03 DIAGNOSIS — I120 Hypertensive chronic kidney disease with stage 5 chronic kidney disease or end stage renal disease: Secondary | ICD-10-CM | POA: Diagnosis not present

## 2021-03-03 DIAGNOSIS — E1122 Type 2 diabetes mellitus with diabetic chronic kidney disease: Secondary | ICD-10-CM | POA: Diagnosis not present

## 2021-03-03 DIAGNOSIS — I480 Paroxysmal atrial fibrillation: Secondary | ICD-10-CM | POA: Diagnosis not present

## 2021-03-03 DIAGNOSIS — Z992 Dependence on renal dialysis: Secondary | ICD-10-CM | POA: Diagnosis not present

## 2021-03-03 DIAGNOSIS — J9601 Acute respiratory failure with hypoxia: Secondary | ICD-10-CM | POA: Diagnosis not present

## 2021-03-03 DIAGNOSIS — D631 Anemia in chronic kidney disease: Secondary | ICD-10-CM | POA: Diagnosis not present

## 2021-03-04 DIAGNOSIS — Z992 Dependence on renal dialysis: Secondary | ICD-10-CM | POA: Diagnosis not present

## 2021-03-04 DIAGNOSIS — N186 End stage renal disease: Secondary | ICD-10-CM | POA: Diagnosis not present

## 2021-03-04 DIAGNOSIS — N2581 Secondary hyperparathyroidism of renal origin: Secondary | ICD-10-CM | POA: Diagnosis not present

## 2021-03-06 DIAGNOSIS — H4312 Vitreous hemorrhage, left eye: Secondary | ICD-10-CM | POA: Diagnosis not present

## 2021-03-06 DIAGNOSIS — Z992 Dependence on renal dialysis: Secondary | ICD-10-CM | POA: Diagnosis not present

## 2021-03-06 DIAGNOSIS — N186 End stage renal disease: Secondary | ICD-10-CM | POA: Diagnosis not present

## 2021-03-06 DIAGNOSIS — H3582 Retinal ischemia: Secondary | ICD-10-CM | POA: Diagnosis not present

## 2021-03-06 DIAGNOSIS — E113513 Type 2 diabetes mellitus with proliferative diabetic retinopathy with macular edema, bilateral: Secondary | ICD-10-CM | POA: Diagnosis not present

## 2021-03-06 DIAGNOSIS — N2581 Secondary hyperparathyroidism of renal origin: Secondary | ICD-10-CM | POA: Diagnosis not present

## 2021-03-09 DIAGNOSIS — Z992 Dependence on renal dialysis: Secondary | ICD-10-CM | POA: Diagnosis not present

## 2021-03-09 DIAGNOSIS — N186 End stage renal disease: Secondary | ICD-10-CM | POA: Diagnosis not present

## 2021-03-09 DIAGNOSIS — N2581 Secondary hyperparathyroidism of renal origin: Secondary | ICD-10-CM | POA: Diagnosis not present

## 2021-03-11 DIAGNOSIS — Z992 Dependence on renal dialysis: Secondary | ICD-10-CM | POA: Diagnosis not present

## 2021-03-11 DIAGNOSIS — N2581 Secondary hyperparathyroidism of renal origin: Secondary | ICD-10-CM | POA: Diagnosis not present

## 2021-03-11 DIAGNOSIS — N186 End stage renal disease: Secondary | ICD-10-CM | POA: Diagnosis not present

## 2021-03-12 DIAGNOSIS — D631 Anemia in chronic kidney disease: Secondary | ICD-10-CM | POA: Diagnosis not present

## 2021-03-12 DIAGNOSIS — I120 Hypertensive chronic kidney disease with stage 5 chronic kidney disease or end stage renal disease: Secondary | ICD-10-CM | POA: Diagnosis not present

## 2021-03-12 DIAGNOSIS — K5900 Constipation, unspecified: Secondary | ICD-10-CM | POA: Diagnosis not present

## 2021-03-12 DIAGNOSIS — Z992 Dependence on renal dialysis: Secondary | ICD-10-CM | POA: Diagnosis not present

## 2021-03-12 DIAGNOSIS — E785 Hyperlipidemia, unspecified: Secondary | ICD-10-CM | POA: Diagnosis not present

## 2021-03-12 DIAGNOSIS — E1122 Type 2 diabetes mellitus with diabetic chronic kidney disease: Secondary | ICD-10-CM | POA: Diagnosis not present

## 2021-03-12 DIAGNOSIS — N186 End stage renal disease: Secondary | ICD-10-CM | POA: Diagnosis not present

## 2021-03-12 DIAGNOSIS — J9601 Acute respiratory failure with hypoxia: Secondary | ICD-10-CM | POA: Diagnosis not present

## 2021-03-12 DIAGNOSIS — I480 Paroxysmal atrial fibrillation: Secondary | ICD-10-CM | POA: Diagnosis not present

## 2021-03-13 DIAGNOSIS — N2581 Secondary hyperparathyroidism of renal origin: Secondary | ICD-10-CM | POA: Diagnosis not present

## 2021-03-13 DIAGNOSIS — Z992 Dependence on renal dialysis: Secondary | ICD-10-CM | POA: Diagnosis not present

## 2021-03-13 DIAGNOSIS — N186 End stage renal disease: Secondary | ICD-10-CM | POA: Diagnosis not present

## 2021-03-16 DIAGNOSIS — N186 End stage renal disease: Secondary | ICD-10-CM | POA: Diagnosis not present

## 2021-03-16 DIAGNOSIS — Z992 Dependence on renal dialysis: Secondary | ICD-10-CM | POA: Diagnosis not present

## 2021-03-16 DIAGNOSIS — N2581 Secondary hyperparathyroidism of renal origin: Secondary | ICD-10-CM | POA: Diagnosis not present

## 2021-03-18 DIAGNOSIS — N186 End stage renal disease: Secondary | ICD-10-CM | POA: Diagnosis not present

## 2021-03-18 DIAGNOSIS — N2581 Secondary hyperparathyroidism of renal origin: Secondary | ICD-10-CM | POA: Diagnosis not present

## 2021-03-18 DIAGNOSIS — Z992 Dependence on renal dialysis: Secondary | ICD-10-CM | POA: Diagnosis not present

## 2021-03-20 DIAGNOSIS — N186 End stage renal disease: Secondary | ICD-10-CM | POA: Diagnosis not present

## 2021-03-20 DIAGNOSIS — N2581 Secondary hyperparathyroidism of renal origin: Secondary | ICD-10-CM | POA: Diagnosis not present

## 2021-03-20 DIAGNOSIS — Z992 Dependence on renal dialysis: Secondary | ICD-10-CM | POA: Diagnosis not present

## 2021-03-23 DIAGNOSIS — Z992 Dependence on renal dialysis: Secondary | ICD-10-CM | POA: Diagnosis not present

## 2021-03-23 DIAGNOSIS — N2581 Secondary hyperparathyroidism of renal origin: Secondary | ICD-10-CM | POA: Diagnosis not present

## 2021-03-23 DIAGNOSIS — N186 End stage renal disease: Secondary | ICD-10-CM | POA: Diagnosis not present

## 2021-03-25 DIAGNOSIS — E1122 Type 2 diabetes mellitus with diabetic chronic kidney disease: Secondary | ICD-10-CM | POA: Diagnosis not present

## 2021-03-25 DIAGNOSIS — N186 End stage renal disease: Secondary | ICD-10-CM | POA: Diagnosis not present

## 2021-03-25 DIAGNOSIS — N2581 Secondary hyperparathyroidism of renal origin: Secondary | ICD-10-CM | POA: Diagnosis not present

## 2021-03-25 DIAGNOSIS — Z992 Dependence on renal dialysis: Secondary | ICD-10-CM | POA: Diagnosis not present

## 2021-03-27 DIAGNOSIS — Z992 Dependence on renal dialysis: Secondary | ICD-10-CM | POA: Diagnosis not present

## 2021-03-27 DIAGNOSIS — N2581 Secondary hyperparathyroidism of renal origin: Secondary | ICD-10-CM | POA: Diagnosis not present

## 2021-03-27 DIAGNOSIS — N186 End stage renal disease: Secondary | ICD-10-CM | POA: Diagnosis not present

## 2021-03-30 DIAGNOSIS — N2581 Secondary hyperparathyroidism of renal origin: Secondary | ICD-10-CM | POA: Diagnosis not present

## 2021-03-30 DIAGNOSIS — Z992 Dependence on renal dialysis: Secondary | ICD-10-CM | POA: Diagnosis not present

## 2021-03-30 DIAGNOSIS — N186 End stage renal disease: Secondary | ICD-10-CM | POA: Diagnosis not present

## 2021-04-01 DIAGNOSIS — Z992 Dependence on renal dialysis: Secondary | ICD-10-CM | POA: Diagnosis not present

## 2021-04-01 DIAGNOSIS — N186 End stage renal disease: Secondary | ICD-10-CM | POA: Diagnosis not present

## 2021-04-01 DIAGNOSIS — N2581 Secondary hyperparathyroidism of renal origin: Secondary | ICD-10-CM | POA: Diagnosis not present

## 2021-04-03 DIAGNOSIS — Z992 Dependence on renal dialysis: Secondary | ICD-10-CM | POA: Diagnosis not present

## 2021-04-03 DIAGNOSIS — N186 End stage renal disease: Secondary | ICD-10-CM | POA: Diagnosis not present

## 2021-04-03 DIAGNOSIS — N2581 Secondary hyperparathyroidism of renal origin: Secondary | ICD-10-CM | POA: Diagnosis not present

## 2021-04-06 DIAGNOSIS — Z992 Dependence on renal dialysis: Secondary | ICD-10-CM | POA: Diagnosis not present

## 2021-04-06 DIAGNOSIS — N186 End stage renal disease: Secondary | ICD-10-CM | POA: Diagnosis not present

## 2021-04-06 DIAGNOSIS — N2581 Secondary hyperparathyroidism of renal origin: Secondary | ICD-10-CM | POA: Diagnosis not present

## 2021-04-07 ENCOUNTER — Ambulatory Visit: Payer: Medicare HMO | Admitting: Vascular Surgery

## 2021-04-08 DIAGNOSIS — Z992 Dependence on renal dialysis: Secondary | ICD-10-CM | POA: Diagnosis not present

## 2021-04-08 DIAGNOSIS — N2581 Secondary hyperparathyroidism of renal origin: Secondary | ICD-10-CM | POA: Diagnosis not present

## 2021-04-08 DIAGNOSIS — N186 End stage renal disease: Secondary | ICD-10-CM | POA: Diagnosis not present

## 2021-04-10 DIAGNOSIS — N186 End stage renal disease: Secondary | ICD-10-CM | POA: Diagnosis not present

## 2021-04-10 DIAGNOSIS — Z992 Dependence on renal dialysis: Secondary | ICD-10-CM | POA: Diagnosis not present

## 2021-04-10 DIAGNOSIS — N2581 Secondary hyperparathyroidism of renal origin: Secondary | ICD-10-CM | POA: Diagnosis not present

## 2021-04-14 DIAGNOSIS — N186 End stage renal disease: Secondary | ICD-10-CM | POA: Diagnosis not present

## 2021-04-14 DIAGNOSIS — N2581 Secondary hyperparathyroidism of renal origin: Secondary | ICD-10-CM | POA: Diagnosis not present

## 2021-04-14 DIAGNOSIS — Z992 Dependence on renal dialysis: Secondary | ICD-10-CM | POA: Diagnosis not present

## 2021-04-15 DIAGNOSIS — Z992 Dependence on renal dialysis: Secondary | ICD-10-CM | POA: Diagnosis not present

## 2021-04-15 DIAGNOSIS — N2581 Secondary hyperparathyroidism of renal origin: Secondary | ICD-10-CM | POA: Diagnosis not present

## 2021-04-15 DIAGNOSIS — N186 End stage renal disease: Secondary | ICD-10-CM | POA: Diagnosis not present

## 2021-04-17 DIAGNOSIS — N2581 Secondary hyperparathyroidism of renal origin: Secondary | ICD-10-CM | POA: Diagnosis not present

## 2021-04-17 DIAGNOSIS — N186 End stage renal disease: Secondary | ICD-10-CM | POA: Diagnosis not present

## 2021-04-17 DIAGNOSIS — Z992 Dependence on renal dialysis: Secondary | ICD-10-CM | POA: Diagnosis not present

## 2021-04-20 DIAGNOSIS — Z992 Dependence on renal dialysis: Secondary | ICD-10-CM | POA: Diagnosis not present

## 2021-04-20 DIAGNOSIS — N2581 Secondary hyperparathyroidism of renal origin: Secondary | ICD-10-CM | POA: Diagnosis not present

## 2021-04-20 DIAGNOSIS — N186 End stage renal disease: Secondary | ICD-10-CM | POA: Diagnosis not present

## 2021-04-20 NOTE — Progress Notes (Signed)
Patient Care Team: [name], MD as PCP - General (Family Medicine) [name], MD as PCP - Cardiology (Cardiology) Center, [facility]  DIAGNOSIS:    ICD-10-CM   1. Lobular carcinoma of right breast ([place])  C50.911       SUMMARY OF ONCOLOGIC HISTORY: Oncology History  Lobular carcinoma of right breast ([place])  08/03/2010 Mammogram   3.4 cm area of density involving the skin and the axillary crease on the right breast.  An ultrasound was performed showing a 2 cm mass    08/19/2010 Initial Diagnosis   invasive mammary carcinoma with lobular features, ER 95%, PR 94%, Ki67 6%, HER2 negative at 0.85   08/25/2010 Breast MRI   8 x 4.1 x 4.0 cm mass in the upper outer quadrant of the right breast extending to the skin and with associated skin dimpling   08/26/2010 - 01/24/2011 Anti-estrogen oral therapy   Letrozole 2.5 mg daily for neoadjuvant therapy   01/24/2011 Breast MRI   complete resolution of abnormal enhancement in the right axillary tail compatible with tumor treatment response   03/26/2011 Surgery   R Lumpectomy; Inv lobular cancer 4.5 cm with tumor at anterior margin, 3/4 LN positive; Reexcision no additional tumor. Oncotype 18. DId not need chemotherapy    - 07/27/2011 Radiation Therapy   Radiation in [place]   09/02/2011 -  Anti-estrogen oral therapy   Letrozole 2.5 mg daily     CHIEF COMPLIANT: Follow-up of right breast cancer on letrozole therapy  INTERVAL HISTORY: [name] is a 80 y.o. with above-mentioned history of right breast cancer treated with neoadjuvant letrozole, lumpectomy, radiation, and who is currently on letrozole. Mammogram on 02/26/2021 showed no evidence of malignancy. She presents to the clinic today for annual follow-up.  She has completed 10 years of antiestrogen therapy.  She is here today to discuss whether or not she needs to continue any further.  She is currently on hemodialysis since October 2021.  She is complaining of intermittent  right breast swelling.  ALLERGIES:  has No Known Allergies.  MEDICATIONS:  Current Outpatient Medications  Medication Sig Dispense Refill   amiodarone (PACERONE) 200 MG tablet Take 1 tablet (200 mg total) by mouth daily. 30 tablet 0   ascorbic acid (VITAMIN C) 500 MG tablet Take 500 mg by mouth 2 (two) times daily.     atorvastatin (LIPITOR) 20 MG tablet Take 20 mg by mouth at bedtime.     Cholecalciferol (VITAMIN D3) 25 MCG (1000 UT) CAPS Take 2,000 Units by mouth daily.     diltiazem (CARDIZEM CD) 120 MG 24 hr capsule Take 1 capsule (120 mg total) by mouth daily. 30 capsule 0   ELIQUIS 2.5 MG TABS tablet TAKE 1 TABLET TWICE DAILY 180 tablet 1   Insulin Degludec-Liraglutide (XULTOPHY) 100-3.6 UNIT-MG/ML SOPN Inject 26 Units into the skin every morning.     letrozole (FEMARA) 2.5 MG tablet Take 1 tablet (2.5 mg total) by mouth at bedtime.     linaclotide (LINZESS) 72 MCG capsule Take 72 mcg by mouth at bedtime.     pantoprazole (PROTONIX) 40 MG tablet Take 1 tablet (40 mg total) by mouth 2 (two) times daily. 60 tablet 0   sevelamer carbonate (RENVELA) 800 MG tablet Take 800 mg by mouth daily.     zinc sulfate 220 (50 Zn) MG capsule Take 220 mg by mouth 2 (two) times daily.     No current facility-administered medications for this visit.  PHYSICAL EXAMINATION: ECOG PERFORMANCE STATUS: 1 - Symptomatic but completely ambulatory  Vitals:   04/21/21 1024  BP: (!) 134/58  Pulse: 86  Resp: 18  Temp: (!) 97.5 F (36.4 C)  SpO2: 100%   Filed Weights   04/21/21 1024  Weight: 148 lb 4.8 oz (67.3 kg)      LABORATORY DATA:  I have reviewed the data as listed CMP Latest Ref Rng & Units 01/11/2021 01/10/2021 01/09/2021  Glucose 70 - 99 mg/dL 164(H) 143(H) 87  BUN 8 - 23 mg/dL 36(H) 16 31(H)  Creatinine 0.44 - 1.00 mg/dL 4.55(H) 2.80(H) 4.28(H)  Sodium 135 - 145 mmol/L 134(L) 135 134(L)  Potassium 3.5 - 5.1 mmol/L 3.9 3.5 3.6  Chloride 98 - 111 mmol/L 93(L) 96(L) 92(L)  CO2 22 - 32  mmol/L '27 28 28  ' Calcium 8.9 - 10.3 mg/dL 9.7 9.0 8.8(L)  Total Protein 6.5 - 8.1 g/dL - - -  Total Bilirubin 0.3 - 1.2 mg/dL - - -  Alkaline Phos 38 - 126 U/L - - -  AST 15 - 41 U/L - - -  ALT 0 - 44 U/L - - -    Lab Results  Component Value Date   WBC 6.9 01/11/2021   HGB 9.2 (L) 01/11/2021   HCT 29.2 (L) 01/11/2021   MCV 84.9 01/11/2021   PLT 385 01/11/2021   NEUTROABS 8.6 (H) 01/09/2021    ASSESSMENT & PLAN:  Lobular carcinoma of right breast Right breast invasive lobular carcinoma status post neoadjuvant hormonal therapy followed by lumpectomy and radiation therapy. Patient had extensive lymph node involvement 7/8 positive lymph nodes. At that time she Oncotype DX that revealed low risk of recurrence and she did not get chemotherapy.   Current treatment: Letrozole 2.5 mg daily since 2012, completed 10 years Because of her lobular breast cancer and because she had 7 positive lymph nodes and recommended extended adjuvant therapy I recommended discontinuing antiestrogen therapy at this time.  Letrozole toxicities: 1. Occasional hot flashes.   2.  Osteopenia : Takes calcium with vitamin D being monitored with bone density      Breast cancer surveillance: 1.  Breast exam: Complains of intermittent right breast swelling 2.  Mammogram 02/26/2021 at [facility]: Benign, breast density category B  3. Bone density 02/26/2021: T score - 2: Osteopenia (previously it was -1.4 osteopenia) Because of her multiple fractures in the hips, she is currently on Boniva.   Kidney biopsy August 2020: Diabetic nephrosclerosis Currently on hemodialysis.   Severe anemia: Dr. Hollie Salk has been managing her anemia. I filled out her paperwork for the cancer policy. Return to clinic on an as-needed basis.    No orders of the defined types were placed in this encounter.  The patient has a good understanding of the overall plan. she agrees with it. she will call with any problems that may develop before the  next visit here.  Total time spent: 20 mins including face to face time and time spent for planning, charting and coordination of care  Rulon Eisenmenger, MD, MPH 04/21/2021  I, Thana Ates, am acting as scribe for Dr. Nicholas Lose.  I have reviewed the above documentation for accuracy and completeness, and I agree with the above.

## 2021-04-21 ENCOUNTER — Other Ambulatory Visit: Payer: Self-pay

## 2021-04-21 ENCOUNTER — Inpatient Hospital Stay: Payer: Medicare HMO | Attending: Hematology and Oncology | Admitting: Hematology and Oncology

## 2021-04-21 DIAGNOSIS — C779 Secondary and unspecified malignant neoplasm of lymph node, unspecified: Secondary | ICD-10-CM | POA: Diagnosis not present

## 2021-04-21 DIAGNOSIS — C792 Secondary malignant neoplasm of skin: Secondary | ICD-10-CM | POA: Diagnosis not present

## 2021-04-21 DIAGNOSIS — C50411 Malignant neoplasm of upper-outer quadrant of right female breast: Secondary | ICD-10-CM | POA: Insufficient documentation

## 2021-04-21 DIAGNOSIS — C50911 Malignant neoplasm of unspecified site of right female breast: Secondary | ICD-10-CM | POA: Diagnosis not present

## 2021-04-21 DIAGNOSIS — Z79811 Long term (current) use of aromatase inhibitors: Secondary | ICD-10-CM | POA: Diagnosis not present

## 2021-04-21 DIAGNOSIS — Z170 Estrogen receptor positive status [ER+]: Secondary | ICD-10-CM | POA: Insufficient documentation

## 2021-04-21 NOTE — Assessment & Plan Note (Signed)
Right breast invasive lobular carcinoma status post neoadjuvant hormonal therapy followed by lumpectomy and radiation therapy. Patient had extensive lymph node involvement 7/8 positive lymph nodes. At that time she Oncotype DX that revealed low risk of recurrence and she did not get chemotherapy.  Current treatment: Letrozole 2.5 mg daily since 2012, completed 10 years Because of her lobular breast cancer and because she had 7 positive lymph nodes and recommended extended adjuvant therapy Patient is interested in taking it beyond 10 years.  Letrozole toxicities: 1. Occasional hot flashes.  2. Osteopenia : Takes calcium with vitamin D being monitored with bone density Bone density 02/08/2019: T score -0.9: Normal  Breast cancer surveillance: 1. Breast exam 04/21/2021 No palpable lumps or nodules.  2. Mammogram8/4/2022at [facility]: Benign, breast density category B 3. Bonedensity8/10/2020: T score- 2: Osteopenia (previously it was-1.4 osteopenia) Because of her multiple fractures in the hips, she is currently on Boniva.  Kidney biopsy August 2020: Diabetic nephrosclerosis She thinks that she is going to undergo dialysis very soon.  Severe anemia: Dr. Hollie Salk has been managing her anemia.  Return to clinic in 1 year for follow-up

## 2021-04-22 DIAGNOSIS — N186 End stage renal disease: Secondary | ICD-10-CM | POA: Diagnosis not present

## 2021-04-22 DIAGNOSIS — N2581 Secondary hyperparathyroidism of renal origin: Secondary | ICD-10-CM | POA: Diagnosis not present

## 2021-04-22 DIAGNOSIS — Z992 Dependence on renal dialysis: Secondary | ICD-10-CM | POA: Diagnosis not present

## 2021-04-24 ENCOUNTER — Observation Stay (HOSPITAL_COMMUNITY): Payer: Medicare HMO

## 2021-04-24 ENCOUNTER — Inpatient Hospital Stay (HOSPITAL_COMMUNITY)
Admission: EM | Admit: 2021-04-24 | Discharge: 2021-04-29 | DRG: 377 | Disposition: A | Discharge status: Home Health | Payer: Medicare HMO | Attending: Internal Medicine | Admitting: Internal Medicine

## 2021-04-24 ENCOUNTER — Encounter (HOSPITAL_COMMUNITY): Payer: Self-pay | Admitting: Emergency Medicine

## 2021-04-24 ENCOUNTER — Other Ambulatory Visit: Payer: Self-pay

## 2021-04-24 DIAGNOSIS — R0602 Shortness of breath: Secondary | ICD-10-CM

## 2021-04-24 DIAGNOSIS — I953 Hypotension of hemodialysis: Secondary | ICD-10-CM | POA: Diagnosis not present

## 2021-04-24 DIAGNOSIS — N186 End stage renal disease: Secondary | ICD-10-CM | POA: Diagnosis present

## 2021-04-24 DIAGNOSIS — D649 Anemia, unspecified: Secondary | ICD-10-CM

## 2021-04-24 DIAGNOSIS — J9601 Acute respiratory failure with hypoxia: Secondary | ICD-10-CM | POA: Diagnosis present

## 2021-04-24 DIAGNOSIS — Z8679 Personal history of other diseases of the circulatory system: Secondary | ICD-10-CM

## 2021-04-24 DIAGNOSIS — K922 Gastrointestinal hemorrhage, unspecified: Secondary | ICD-10-CM | POA: Diagnosis not present

## 2021-04-24 DIAGNOSIS — E782 Mixed hyperlipidemia: Secondary | ICD-10-CM | POA: Diagnosis not present

## 2021-04-24 DIAGNOSIS — M6281 Muscle weakness (generalized): Secondary | ICD-10-CM | POA: Diagnosis present

## 2021-04-24 DIAGNOSIS — R001 Bradycardia, unspecified: Secondary | ICD-10-CM | POA: Diagnosis present

## 2021-04-24 DIAGNOSIS — K31819 Angiodysplasia of stomach and duodenum without bleeding: Secondary | ICD-10-CM | POA: Diagnosis not present

## 2021-04-24 DIAGNOSIS — D631 Anemia in chronic kidney disease: Secondary | ICD-10-CM | POA: Diagnosis present

## 2021-04-24 DIAGNOSIS — E8771 Transfusion associated circulatory overload: Secondary | ICD-10-CM | POA: Diagnosis not present

## 2021-04-24 DIAGNOSIS — R0902 Hypoxemia: Secondary | ICD-10-CM | POA: Diagnosis not present

## 2021-04-24 DIAGNOSIS — I672 Cerebral atherosclerosis: Secondary | ICD-10-CM | POA: Diagnosis not present

## 2021-04-24 DIAGNOSIS — I6503 Occlusion and stenosis of bilateral vertebral arteries: Secondary | ICD-10-CM | POA: Diagnosis not present

## 2021-04-24 DIAGNOSIS — D62 Acute posthemorrhagic anemia: Secondary | ICD-10-CM | POA: Diagnosis present

## 2021-04-24 DIAGNOSIS — Z923 Personal history of irradiation: Secondary | ICD-10-CM | POA: Diagnosis not present

## 2021-04-24 DIAGNOSIS — E1169 Type 2 diabetes mellitus with other specified complication: Secondary | ICD-10-CM | POA: Diagnosis not present

## 2021-04-24 DIAGNOSIS — Z992 Dependence on renal dialysis: Secondary | ICD-10-CM

## 2021-04-24 DIAGNOSIS — K589 Irritable bowel syndrome without diarrhea: Secondary | ICD-10-CM | POA: Diagnosis present

## 2021-04-24 DIAGNOSIS — N2581 Secondary hyperparathyroidism of renal origin: Secondary | ICD-10-CM | POA: Diagnosis present

## 2021-04-24 DIAGNOSIS — I6930 Unspecified sequelae of cerebral infarction: Secondary | ICD-10-CM | POA: Diagnosis not present

## 2021-04-24 DIAGNOSIS — Z20822 Contact with and (suspected) exposure to covid-19: Secondary | ICD-10-CM | POA: Diagnosis present

## 2021-04-24 DIAGNOSIS — I120 Hypertensive chronic kidney disease with stage 5 chronic kidney disease or end stage renal disease: Secondary | ICD-10-CM | POA: Diagnosis present

## 2021-04-24 DIAGNOSIS — I6389 Other cerebral infarction: Secondary | ICD-10-CM

## 2021-04-24 DIAGNOSIS — Z853 Personal history of malignant neoplasm of breast: Secondary | ICD-10-CM | POA: Diagnosis not present

## 2021-04-24 DIAGNOSIS — J811 Chronic pulmonary edema: Secondary | ICD-10-CM | POA: Diagnosis not present

## 2021-04-24 DIAGNOSIS — E119 Type 2 diabetes mellitus without complications: Secondary | ICD-10-CM

## 2021-04-24 DIAGNOSIS — K31811 Angiodysplasia of stomach and duodenum with bleeding: Principal | ICD-10-CM | POA: Diagnosis present

## 2021-04-24 DIAGNOSIS — I6781 Acute cerebrovascular insufficiency: Secondary | ICD-10-CM

## 2021-04-24 DIAGNOSIS — K209 Esophagitis, unspecified without bleeding: Secondary | ICD-10-CM | POA: Diagnosis not present

## 2021-04-24 DIAGNOSIS — K2289 Other specified disease of esophagus: Secondary | ICD-10-CM | POA: Diagnosis not present

## 2021-04-24 DIAGNOSIS — I639 Cerebral infarction, unspecified: Secondary | ICD-10-CM

## 2021-04-24 DIAGNOSIS — M898X9 Other specified disorders of bone, unspecified site: Secondary | ICD-10-CM | POA: Diagnosis present

## 2021-04-24 DIAGNOSIS — M21371 Foot drop, right foot: Secondary | ICD-10-CM | POA: Diagnosis present

## 2021-04-24 DIAGNOSIS — J810 Acute pulmonary edema: Secondary | ICD-10-CM | POA: Diagnosis present

## 2021-04-24 DIAGNOSIS — Z7901 Long term (current) use of anticoagulants: Secondary | ICD-10-CM

## 2021-04-24 DIAGNOSIS — N250 Renal osteodystrophy: Secondary | ICD-10-CM | POA: Diagnosis not present

## 2021-04-24 DIAGNOSIS — I6603 Occlusion and stenosis of bilateral middle cerebral arteries: Secondary | ICD-10-CM | POA: Diagnosis not present

## 2021-04-24 DIAGNOSIS — E1122 Type 2 diabetes mellitus with diabetic chronic kidney disease: Secondary | ICD-10-CM | POA: Diagnosis present

## 2021-04-24 DIAGNOSIS — Z794 Long term (current) use of insulin: Secondary | ICD-10-CM

## 2021-04-24 DIAGNOSIS — Y848 Other medical procedures as the cause of abnormal reaction of the patient, or of later complication, without mention of misadventure at the time of the procedure: Secondary | ICD-10-CM | POA: Diagnosis not present

## 2021-04-24 DIAGNOSIS — I4891 Unspecified atrial fibrillation: Secondary | ICD-10-CM | POA: Diagnosis present

## 2021-04-24 DIAGNOSIS — Z8711 Personal history of peptic ulcer disease: Secondary | ICD-10-CM | POA: Diagnosis not present

## 2021-04-24 DIAGNOSIS — E8779 Other fluid overload: Secondary | ICD-10-CM | POA: Diagnosis not present

## 2021-04-24 DIAGNOSIS — E611 Iron deficiency: Secondary | ICD-10-CM | POA: Diagnosis present

## 2021-04-24 DIAGNOSIS — R4781 Slurred speech: Secondary | ICD-10-CM | POA: Diagnosis present

## 2021-04-24 DIAGNOSIS — J90 Pleural effusion, not elsewhere classified: Secondary | ICD-10-CM | POA: Diagnosis not present

## 2021-04-24 DIAGNOSIS — Z79899 Other long term (current) drug therapy: Secondary | ICD-10-CM

## 2021-04-24 DIAGNOSIS — E785 Hyperlipidemia, unspecified: Secondary | ICD-10-CM | POA: Diagnosis present

## 2021-04-24 DIAGNOSIS — R29818 Other symptoms and signs involving the nervous system: Secondary | ICD-10-CM | POA: Diagnosis not present

## 2021-04-24 DIAGNOSIS — I129 Hypertensive chronic kidney disease with stage 1 through stage 4 chronic kidney disease, or unspecified chronic kidney disease: Secondary | ICD-10-CM | POA: Diagnosis not present

## 2021-04-24 LAB — CBC WITH DIFFERENTIAL/PLATELET
Abs Immature Granulocytes: 0.03 10*3/uL (ref 0.00–0.07)
Basophils Absolute: 0 10*3/uL (ref 0.0–0.1)
Basophils Relative: 0 %
Eosinophils Absolute: 0 10*3/uL (ref 0.0–0.5)
Eosinophils Relative: 0 %
HCT: 18.3 % — ABNORMAL LOW (ref 36.0–46.0)
Hemoglobin: 5.2 g/dL — CL (ref 12.0–15.0)
Immature Granulocytes: 0 %
Lymphocytes Relative: 8 %
Lymphs Abs: 0.7 10*3/uL (ref 0.7–4.0)
MCH: 28.3 pg (ref 26.0–34.0)
MCHC: 28.4 g/dL — ABNORMAL LOW (ref 30.0–36.0)
MCV: 99.5 fL (ref 80.0–100.0)
Monocytes Absolute: 0.5 10*3/uL (ref 0.1–1.0)
Monocytes Relative: 6 %
Neutro Abs: 7.4 10*3/uL (ref 1.7–7.7)
Neutrophils Relative %: 86 %
Platelets: 258 10*3/uL (ref 150–400)
RBC: 1.84 MIL/uL — ABNORMAL LOW (ref 3.87–5.11)
RDW: 18.2 % — ABNORMAL HIGH (ref 11.5–15.5)
WBC: 8.6 10*3/uL (ref 4.0–10.5)
nRBC: 0 % (ref 0.0–0.2)

## 2021-04-24 LAB — COMPREHENSIVE METABOLIC PANEL
ALT: 14 U/L (ref 0–44)
AST: 17 U/L (ref 15–41)
Albumin: 2.9 g/dL — ABNORMAL LOW (ref 3.5–5.0)
Alkaline Phosphatase: 76 U/L (ref 38–126)
Anion gap: 19 — ABNORMAL HIGH (ref 5–15)
BUN: 62 mg/dL — ABNORMAL HIGH (ref 8–23)
CO2: 23 mmol/L (ref 22–32)
Calcium: 9.4 mg/dL (ref 8.9–10.3)
Chloride: 97 mmol/L — ABNORMAL LOW (ref 98–111)
Creatinine, Ser: 4.84 mg/dL — ABNORMAL HIGH (ref 0.44–1.00)
GFR, Estimated: 9 mL/min — ABNORMAL LOW (ref >60)
Glucose, Bld: 174 mg/dL — ABNORMAL HIGH (ref 70–99)
Potassium: 5 mmol/L (ref 3.5–5.1)
Sodium: 139 mmol/L (ref 135–145)
Total Bilirubin: 0.3 mg/dL (ref 0.3–1.2)
Total Protein: 6 g/dL — ABNORMAL LOW (ref 6.5–8.1)

## 2021-04-24 LAB — POC OCCULT BLOOD, ED: Fecal Occult Bld: POSITIVE — AB

## 2021-04-24 LAB — CBC
HCT: 21.6 % — ABNORMAL LOW (ref 36.0–46.0)
Hemoglobin: 6.5 g/dL — CL (ref 12.0–15.0)
MCH: 28.3 pg (ref 26.0–34.0)
MCHC: 30.1 g/dL (ref 30.0–36.0)
MCV: 93.9 fL (ref 80.0–100.0)
Platelets: 231 10*3/uL (ref 150–400)
RBC: 2.3 MIL/uL — ABNORMAL LOW (ref 3.87–5.11)
RDW: 18 % — ABNORMAL HIGH (ref 11.5–15.5)
WBC: 10.2 10*3/uL (ref 4.0–10.5)
nRBC: 0 % (ref 0.0–0.2)

## 2021-04-24 LAB — RESP PANEL BY RT-PCR (FLU A&B, COVID) ARPGX2
Influenza A by PCR: NEGATIVE
Influenza B by PCR: NEGATIVE
SARS Coronavirus 2 by RT PCR: NEGATIVE

## 2021-04-24 LAB — PREPARE RBC (CROSSMATCH)

## 2021-04-24 MED ORDER — PANTOPRAZOLE SODIUM 40 MG IV SOLR
40.0000 mg | Freq: Once | INTRAVENOUS | Status: AC
  Administered 2021-04-24: 40 mg via INTRAVENOUS
  Filled 2021-04-24: qty 40

## 2021-04-24 MED ORDER — PANTOPRAZOLE SODIUM 40 MG IV SOLR
40.0000 mg | Freq: Two times a day (BID) | INTRAVENOUS | Status: DC
  Administered 2021-04-24 – 2021-04-26 (×4): 40 mg via INTRAVENOUS
  Filled 2021-04-24 (×4): qty 40

## 2021-04-24 MED ORDER — ATORVASTATIN CALCIUM 10 MG PO TABS
20.0000 mg | ORAL_TABLET | Freq: Every day | ORAL | Status: DC
  Administered 2021-04-25 – 2021-04-28 (×4): 20 mg via ORAL
  Filled 2021-04-24 (×4): qty 2

## 2021-04-24 MED ORDER — ACETAMINOPHEN 650 MG RE SUPP: 650.0000 mg | Freq: Four times a day (QID) | RECTAL | Status: DC | PRN

## 2021-04-24 MED ORDER — SODIUM CHLORIDE 0.9 % IV SOLN
10.0000 mL/h | Freq: Once | INTRAVENOUS | Status: AC
  Administered 2021-04-24: 10 mL/h via INTRAVENOUS

## 2021-04-24 MED ORDER — IOHEXOL 350 MG/ML SOLN
65.0000 mL | Freq: Once | INTRAVENOUS | Status: AC | PRN
  Administered 2021-04-24: 65 mL via INTRAVENOUS

## 2021-04-24 MED ORDER — INSULIN ASPART 100 UNIT/ML IJ SOLN
0.0000 [IU] | Freq: Three times a day (TID) | INTRAMUSCULAR | Status: DC
  Administered 2021-04-26 – 2021-04-28 (×3): 1 [IU] via SUBCUTANEOUS
  Administered 2021-04-28: 2 [IU] via SUBCUTANEOUS

## 2021-04-24 MED ORDER — ONDANSETRON HCL 4 MG/2ML IJ SOLN: 4.0000 mg | Freq: Three times a day (TID) | INTRAMUSCULAR | Status: DC | PRN

## 2021-04-24 MED ORDER — SODIUM CHLORIDE 0.9% FLUSH
3.0000 mL | Freq: Two times a day (BID) | INTRAVENOUS | Status: DC
  Administered 2021-04-24 – 2021-04-28 (×9): 3 mL via INTRAVENOUS

## 2021-04-24 MED ORDER — ACETAMINOPHEN 325 MG PO TABS: 650.0000 mg | ORAL_TABLET | Freq: Four times a day (QID) | ORAL | Status: DC | PRN

## 2021-04-24 NOTE — ED Provider Notes (Signed)
Emergency Medicine Provider Triage Evaluation Note  [name] , a 80 y.o. female  was evaluated in triage.  Pt complains of low hgb. Labs checked at dialysis and she was told her hgb was 6. Reports fatigue and shakiness.  Review of Systems  Positive: Anemia, fatigue, shakiness Negative: Syncope, blood in stool  Physical Exam  BP 115/76 (BP Location: Left Arm)   Pulse 76   Temp 98.6 F (37 C) (Oral)   Resp 18   SpO2 100%  Gen:   Awake, no distress   Resp:  Normal effort  MSK:   Moves extremities without difficulty  Other:  Pt appears pale  Medical Decision Making  Medically screening exam initiated at 1:00 PM.  Appropriate orders placed.  [name] was informed that the remainder of the evaluation will be completed by another provider, this initial triage assessment does not replace that evaluation, and the importance of remaining in the ED until their evaluation is complete.     Rodney Booze, PA-C 04/24/21 1301    Charlesetta Shanks, MD 04/26/21 1615

## 2021-04-24 NOTE — ED Provider Notes (Signed)
[facility] EMERGENCY DEPARTMENT Provider Note   CSN: [id number] Arrival date & time: 04/24/21  1246     History Chief Complaint  Patient presents with   Abnormal Lab    [name] is a 80 y.o. female.   Abnormal Lab Patient presents for anemia.  She has a previous hospitalization for symptomatic anemia requiring blood transfusion.  Source of bleeding at that time was GI.  She has ESRD and is currently undergoing HD on Monday, Wednesday, and Friday.  On Wednesday, they drew blood.  Patient got a full session of dialysis at that time.  Today, when she showed up at dialysis, they informed her that her hemoglobin on Wednesday was 6.  She was advised to come to the ED prior to undergoing hemodialysis.  Patient is unaware of current source of blood loss.  Per chart review, her baseline hemoglobin is 9.  She does endorse recent fatigue.  She is accompanied by her daughter who confirms that her mother has appeared severely fatigued recently.   She has had mild shortness of breath.  She denies any current areas of pain.  She denies any recent bloody or dark stools.  She denies use of blood thinners.    Past Medical History:  Diagnosis Date   Cancer ([place])    Chronic kidney disease    Diabetes mellitus    Hyperlipidemia    Hypertension    pt states at times    Patient Active Problem List   Diagnosis Date Noted   Acute blood loss anemia 04/24/2021   Anemia 01/10/2021   Acute pulmonary edema ([place]) 01/10/2021   Acute respiratory failure with hypoxia (H[facility]) 01/09/2021   Gastritis and gastroduodenitis    ESRD on hemodialysis ([place])    Upper GI bleed    CVA (cerebral vascular accident) ([place]) 07/12/2020   Left temporal lobe infarction ([place]) 09/05/2017   Essential hypertension 09/03/2017   DM type 2 (diabetes mellitus, type 2) ([place]) 09/03/2017   Lobular carcinoma of right breast ([place]) 03/01/2011    Past Surgical History:  Procedure Laterality Date   BIOPSY   07/17/2020   Procedure: BIOPSY;  Surgeon: Yetta Flock, MD;  Location: [place];  Service: Gastroenterology;;   BREAST LUMPECTOMY  03/26/2011   right   ESOPHAGOGASTRODUODENOSCOPY (EGD) WITH PROPOFOL N/A 07/17/2020   Procedure: ESOPHAGOGASTRODUODENOSCOPY (EGD) WITH PROPOFOL;  Surgeon: Yetta Flock, MD;  Location: [facility] ENDOSCOPY;  Service: Gastroenterology;  Laterality: N/A;     OB History   No obstetric history on file.     Family History  Problem Relation Age of Onset   Cancer Father        bronchial   Cancer Paternal Aunt        breast   Cancer Paternal Aunt        breast    Social History   Tobacco Use   Smoking status: Never   Smokeless tobacco: Never  Vaping Use   Vaping Use: Never used  Substance Use Topics   Alcohol use: No   Drug use: No    Home Medications Prior to Admission medications   Medication Sig Start Date End Date Taking? Authorizing Provider  amiodarone (PACERONE) 200 MG tablet Take 1 tablet (200 mg total) by mouth daily. 07/22/20  Yes Thurnell Lose, MD  ascorbic acid (VITAMIN C) 500 MG tablet Take 500 mg by mouth See admin instructions. Take one tablet (500 mg) by mouth on Sunday, Tuesday, Thursday, Saturday mornings (non-dialysis days);  take one tablet (500 mg) daily with supper   Yes [provider]  atorvastatin (LIPITOR) 20 MG tablet Take 20 mg by mouth at bedtime.   Yes [provider]  Cholecalciferol (VITAMIN D3) 25 MCG (1000 UT) CAPS Take 1,000 Units by mouth See admin instructions. Take one tablet (1000 mg) by mouth on Sunday, Tuesday, Thursday, Saturday mornings (non-dialysis days); take one tablet (1000 mg) daily with supper   Yes [provider]  diltiazem (CARDIZEM CD) 120 MG 24 hr capsule Take 1 capsule (120 mg total) by mouth daily. Patient taking differently: Take 120 mg by mouth every evening. 07/22/20  Yes Thurnell Lose, MD  ELIQUIS 2.5 MG TABS tablet TAKE 1 TABLET TWICE DAILY Patient  taking differently: Take 2.5 mg by mouth 2 (two) times daily. 01/30/21  Yes [name], MD  Insulin Degludec-Liraglutide (XULTOPHY) 100-3.6 UNIT-MG/ML SOPN Inject 16 Units into the skin every morning.   Yes [provider]  linaclotide (LINZESS) 72 MCG capsule Take 72 mcg by mouth daily before supper.   Yes [provider]  pantoprazole (PROTONIX) 40 MG tablet Take 1 tablet (40 mg total) by mouth 2 (two) times daily. Patient taking differently: Take 40 mg by mouth See admin instructions. Take one tablet (40 mg) by mouth on Sunday, Tuesday, Thursday, Saturday mornings (non-dialysis days); take one tablet (40 mg) by mouth daily with supper 07/22/20  Yes Thurnell Lose, MD  sevelamer carbonate (RENVELA) 800 MG tablet Take 800 mg by mouth See admin instructions. Take one tablet (800 mg) by mouth up to three times daily with meals 05/20/20  Yes [provider]  zinc sulfate 220 (50 Zn) MG capsule Take 220 mg by mouth See admin instructions. Take one tablet (220 mg) by mouth on Sunday, Tuesday, Thursday, Saturday mornings (non-dialysis days); take one tablet (220 mg) daily with supper   Yes [provider]  letrozole (FEMARA) 2.5 MG tablet Take 2.5 mg by mouth daily. Patient not taking: Reported on 04/24/2021    [provider]    Allergies    Patient has no known allergies.  Review of Systems   Review of Systems  Constitutional:  Positive for activity change and fatigue. Negative for chills, diaphoresis and fever.  HENT:  Negative for ear pain and sore throat.   Eyes:  Negative for pain and visual disturbance.  Respiratory:  Positive for shortness of breath. Negative for cough and chest tightness.   Cardiovascular:  Negative for chest pain and palpitations.  Gastrointestinal:  Negative for abdominal pain, blood in stool, diarrhea, nausea and vomiting.  Genitourinary:  Negative for dysuria, flank pain, hematuria and pelvic pain.  Musculoskeletal:   Negative for arthralgias, back pain, joint swelling and neck pain.  Skin:  Negative for color change, pallor, rash and wound.  Neurological:  Positive for weakness (generalized). Negative for dizziness, seizures, syncope, light-headedness, numbness and headaches.  Hematological:  Does not bruise/bleed easily.  Psychiatric/Behavioral:  Negative for confusion and decreased concentration.   All other systems reviewed and are negative.  Physical Exam Updated Vital Signs BP 121/70 (BP Location: Left Arm)   Pulse 92   Temp 98.2 F (36.8 C) (Oral)   Resp 20   Wt 68.8 kg   SpO2 92%   BMI 26.04 kg/m   Physical Exam Vitals and nursing note reviewed. Exam conducted with a chaperone present.  Constitutional:      General: She is not in acute distress.    Appearance: She is  well-developed and normal weight. She is ill-appearing. She is not toxic-appearing or diaphoretic.  HENT:     Head: Normocephalic and atraumatic.     Right Ear: External ear normal.     Left Ear: External ear normal.     Nose: Nose normal.     Mouth/Throat:     Mouth: Mucous membranes are moist.     Pharynx: Oropharynx is clear.  Eyes:     General: No scleral icterus.    Extraocular Movements: Extraocular movements intact.     Conjunctiva/sclera: Conjunctivae normal.  Cardiovascular:     Rate and Rhythm: Normal rate and regular rhythm.     Heart sounds: No murmur heard. Pulmonary:     Effort: Pulmonary effort is normal. No respiratory distress.     Breath sounds: Normal breath sounds. No wheezing or rales.  Chest:     Chest wall: No tenderness.  Abdominal:     Palpations: Abdomen is soft.     Tenderness: There is no abdominal tenderness. There is no right CVA tenderness or left CVA tenderness.  Genitourinary:    Rectum: Guaiac result positive.  Musculoskeletal:        General: No swelling, tenderness, deformity or signs of injury. Normal range of motion.     Cervical back: Normal range of motion and neck  supple. No rigidity.  Skin:    General: Skin is warm and dry.     Coloration: Skin is pale. Skin is not jaundiced.     Findings: No bruising.  Neurological:     General: No focal deficit present.     Mental Status: She is alert and oriented to person, place, and time.     Cranial Nerves: No cranial nerve deficit.     Sensory: No sensory deficit.     Motor: No weakness.     Coordination: Coordination normal.  Psychiatric:        Mood and Affect: Mood normal.        Behavior: Behavior normal.        Thought Content: Thought content normal.        Judgment: Judgment normal.    ED Results / Procedures / Treatments   Labs (all labs ordered are listed, but only abnormal results are displayed) Labs Reviewed  CBC WITH DIFFERENTIAL/PLATELET - Abnormal; Notable for the following components:      Result Value   RBC 1.84 (*)    Hemoglobin 5.2 (*)    HCT 18.3 (*)    MCHC 28.4 (*)    RDW 18.2 (*)    All other components within normal limits  COMPREHENSIVE METABOLIC PANEL - Abnormal; Notable for the following components:   Chloride 97 (*)    Glucose, Bld 174 (*)    BUN 62 (*)    Creatinine, Ser 4.84 (*)    Total Protein 6.0 (*)    Albumin 2.9 (*)    GFR, Estimated 9 (*)    Anion gap 19 (*)    All other components within normal limits  CBC - Abnormal; Notable for the following components:   RBC 2.30 (*)    Hemoglobin 6.5 (*)    HCT 21.6 (*)    RDW 18.0 (*)    All other components within normal limits  CBC WITH DIFFERENTIAL/PLATELET - Abnormal; Notable for the following components:   RBC 2.84 (*)    Hemoglobin 8.0 (*)    HCT 26.1 (*)    RDW 17.6 (*)    Neutro Abs 8.3 (*)  Lymphs Abs 0.6 (*)    All other components within normal limits  RENAL FUNCTION PANEL - Abnormal; Notable for the following components:   Potassium 5.3 (*)    Chloride 97 (*)    Glucose, Bld 136 (*)    BUN 72 (*)    Creatinine, Ser 5.49 (*)    Phosphorus 7.5 (*)    Albumin 2.7 (*)    GFR, Estimated 7  (*)    Anion gap 17 (*)    All other components within normal limits  HEPATITIS B SURFACE ANTIBODY,QUALITATIVE - Abnormal; Notable for the following components:   Hep B S Ab Reactive (*)    All other components within normal limits  GLUCOSE, CAPILLARY - Abnormal; Notable for the following components:   Glucose-Capillary 156 (*)    All other components within normal limits  CBC - Abnormal; Notable for the following components:   WBC 18.2 (*)    RBC 2.99 (*)    Hemoglobin 8.4 (*)    HCT 26.8 (*)    RDW 17.5 (*)    All other components within normal limits  GLUCOSE, CAPILLARY - Abnormal; Notable for the following components:   Glucose-Capillary 116 (*)    All other components within normal limits  GLUCOSE, CAPILLARY - Abnormal; Notable for the following components:   Glucose-Capillary 141 (*)    All other components within normal limits  POC OCCULT BLOOD, ED - Abnormal; Notable for the following components:   Fecal Occult Bld POSITIVE (*)    All other components within normal limits  CBG MONITORING, ED - Abnormal; Notable for the following components:   Glucose-Capillary 133 (*)    All other components within normal limits  RESP PANEL BY RT-PCR (FLU A&B, COVID) [id number]  HEPATITIS B SURFACE ANTIGEN  HEPATITIS B SURFACE ANTIBODY, QUANTITATIVE  CBC WITH DIFFERENTIAL/PLATELET  RENAL FUNCTION PANEL  TYPE AND SCREEN  PREPARE RBC (CROSSMATCH)    EKG EKG Interpretation  Date/Time:  Friday April 24 2021 19:19:21 EDT Ventricular Rate:  77 PR Interval:  222 QRS Duration: 105 QT Interval:  419 QTC Calculation: 475 R Axis:   -41 Text Interpretation: Sinus rhythm Prolonged PR interval Left axis deviation Repol abnrm suggests ischemia, lateral leads Confirmed by Madalyn Rob [phone]) on 04/25/2021 4:13:40 PM  Radiology CT ANGIO HEAD NECK W WO CM  Result Date: 04/24/2021 CLINICAL DATA:  Focal neuro deficit, > 6 hrs, stroke suspected. EXAM: CT ANGIOGRAPHY HEAD AND NECK TECHNIQUE:  Multidetector CT imaging of the head and neck was performed using the standard protocol during bolus administration of intravenous contrast. Multiplanar CT image reconstructions and MIPs were obtained to evaluate the vascular anatomy. Carotid stenosis measurements (when applicable) are obtained utilizing NASCET criteria, using the distal internal carotid diameter as the denominator. CONTRAST:  52mL OMNIPAQUE IOHEXOL 350 MG/ML SOLN COMPARISON:  11/07/2020 FINDINGS: CTA NECK FINDINGS Aortic arch: Standard 3 vessel aortic arch with a moderate amount of calcified plaque. No significant arch vessel origin stenosis. Right carotid system: Patent with predominantly calcified plaque at the carotid bifurcation and in the proximal ICA resulting in less than 50% stenosis. Left carotid system: Patent with predominantly calcified plaque at the carotid bifurcation resulting in less than 50% stenosis. Subtle beading of the mid to distal cervical ICA, similar to the prior CTA and which could reflect mild changes of fibromuscular dysplasia. Vertebral arteries: Patent and codominant with calcified plaque resulting in unchanged mild stenosis of the right vertebral origin. Focal moderate narrowing of the left vertebral artery  at the C1-2 level which may be related to asymmetrically severe left lateral C1-2 arthropathy with prominent spurring as well as kinking of the vessel. Normal caliber of the left vertebral artery proximal and distal to this without a visible dissection flap. Skeleton: Advanced median and left lateral C1-2 arthropathy. Advanced multilevel cervical facet arthrosis. Other neck: No evidence of cervical lymphadenopathy or mass. Upper chest: Small bilateral pleural effusions. Partially visualized right jugular venous catheter. Review of the MIP images confirms the above findings CTA HEAD FINDINGS Anterior circulation: The internal carotid arteries are patent from skull base to carotid termini with similar appearance of  moderate bilateral paraclinoid stenoses. ACAs and MCAs are patent without evidence of a proximal branch occlusion. Severe mid to distal right M1 and distal left M1/proximal M2 stenoses are similar to the prior CTA as well. No aneurysm is identified. Posterior circulation: The intracranial vertebral arteries are patent to the basilar with atherosclerotic irregularity bilaterally but no high-grade stenosis. The basilar artery is widely patent. The PCAs are patent with similar appearance of severe bilateral P2 stenoses. No aneurysm is identified. Venous sinuses: As permitted by contrast timing, patent. Anatomic variants: None. Review of the MIP images confirms the above findings IMPRESSION: 1. No large vessel occlusion. 2. Similar appearance of advanced intracranial atherosclerosis with multiple moderate to severe anterior and posterior circulation stenoses as above. 3. Cervical carotid atherosclerosis without significant stenosis. 4. Mild right vertebral artery origin stenosis. 5.  Aortic Atherosclerosis (ICD10-I70.0). These results were communicated to Dr. Leonel Ramsay at 6:27 pm on 04/24/2021 by text page via the [facility] messaging system. Electronically Signed   By: Logan Bores M.D.   On: 04/24/2021 18:42   DG CHEST PORT 1 VIEW  Result Date: 04/25/2021 CLINICAL DATA:  Encounter for shortness of breath EXAM: PORTABLE CHEST 1 VIEW COMPARISON:  01/09/2021 FINDINGS: Cardiopericardial enlargement. Dialysis catheter on the right with tip at the upper cavoatrial junction. Diffuse interstitial and airspace opacity with [employer]. Left pleural effusion. The retrocardiac lung is either opacified or obscured. IMPRESSION: Pulmonary edema and left pleural effusion. Electronically Signed   By: Jorje Guild M.D.   On: 04/25/2021 06:32   CT HEAD CODE STROKE WO CONTRAST`  Result Date: 04/24/2021 CLINICAL DATA:  Code stroke.  Neuro deficit, acute, stroke suspected EXAM: CT HEAD WITHOUT CONTRAST TECHNIQUE: Contiguous axial  images were obtained from the base of the skull through the vertex without intravenous contrast. COMPARISON:  CT Head 07/16/2020. FINDINGS: Brain: No evidence of acute large vascular territory infarction, hemorrhage, hydrocephalus, extra-axial collection or mass lesion/mass effect. Moderate patchy white matter hypoattenuation, nonspecific but compatible with chronic microvascular ischemic disease. Remote right cerebellar infarct. Vascular: No hyperdense vessel identified. Calcific intracranial atherosclerosis. Skull: No acute fracture. Sinuses/Orbits: Visualized sinuses are clear. No acute orbital abnormality. Other: Trace right mastoid effusion. ASPECTS [facility] Stroke Program Early CT Score) total score (0-10 with 10 being normal): 10. IMPRESSION: 1. No evidence of acute large vascular territory infarct or acute hemorrhage. ASPECTS is 10. 2. Moderate chronic microvascular ischemic disease and remote right cerebellar infarct. Code stroke imaging results were communicated on 04/24/2021 at 5:57 pm to provider Dr. Leonel Ramsay via telephone, who verbally acknowledged these results. Electronically Signed   By: Margaretha Sheffield M.D.   On: 04/24/2021 17:59    Procedures Procedures   Medications Ordered in ED Medications  sodium chloride flush (NS) 0.9 % injection 3 mL (3 mLs Intravenous Given 04/25/21 2101)  acetaminophen (TYLENOL) tablet 650 mg (has no administration in time range)  Or  acetaminophen (TYLENOL) suppository 650 mg (has no administration in time range)  insulin aspart (novoLOG) injection 0-6 Units (0 Units Subcutaneous Not Given 04/25/21 1631)  pantoprazole (PROTONIX) injection 40 mg (40 mg Intravenous Given 04/25/21 2058)  ondansetron (ZOFRAN) injection 4 mg (has no administration in time range)  atorvastatin (LIPITOR) tablet 20 mg (20 mg Oral Given 04/25/21 2057)  Chlorhexidine Gluconate Cloth 2 % PADS 6 each (6 each Topical Not Given 04/25/21 0717)  pentafluoroprop-tetrafluoroeth (GEBAUERS)  aerosol 1 application (has no administration in time range)  lidocaine (PF) (XYLOCAINE) 1 % injection 5 mL (has no administration in time range)  lidocaine-prilocaine (EMLA) cream 1 application (has no administration in time range)  0.9 %  sodium chloride infusion (has no administration in time range)  0.9 %  sodium chloride infusion (has no administration in time range)  heparin injection 1,000 Units (has no administration in time range)  alteplase (CATHFLO ACTIVASE) injection 2 mg (has no administration in time range)  heparin sodium (porcine) injection 3,800 Units (3,800 Units Intravenous Given 04/25/21 1535)  0.9 %  sodium chloride infusion (0 mL/hr Intravenous Stopped 04/25/21 0046)  pantoprazole (PROTONIX) injection 40 mg (40 mg Intravenous Given 04/24/21 1821)  iohexol (OMNIPAQUE) 350 MG/ML injection 65 mL (65 mLs Intravenous Contrast Given 04/24/21 1813)  furosemide (LASIX) injection 80 mg (80 mg Intravenous Given 04/25/21 0637)  albumin human 25 % solution 12.5 g (12.5 g Intravenous New Bag/Given 04/25/21 1341)    ED Course  I have reviewed the triage vital signs and the nursing notes.  Pertinent labs & imaging results that were available during my care of the patient were reviewed by me and considered in my medical decision making (see chart for details).    MDM Rules/Calculators/A&P                         CRITICAL CARE Performed by: Godfrey Pick   Total critical care time: 35 minutes  Critical care time was exclusive of separately billable procedures and treating other patients.  Critical care was necessary to treat or prevent imminent or life-threatening deterioration.  Critical care was time spent personally by me on the following activities: development of treatment plan with patient and/or surrogate as well as nursing, discussions with consultants, evaluation of patient's response to treatment, examination of patient, obtaining history from patient or surrogate, ordering and  performing treatments and interventions, ordering and review of laboratory studies, ordering and review of radiographic studies, pulse oximetry and re-evaluation of patient's condition.   Patient presents for symptomatic anemia.  Hemoglobin, when checked at dialysis 2 days ago, was reportedly 6.  Per chart review, baseline hemoglobin is 9.  On arrival, vital signs are normal.  Patient is chronically ill-appearing.  She does appear pale but patient's daughter states that that is the normal color for her.  Patient denies any current discomfort.  Breathing is even and unlabored.  Patient did miss her dialysis session today but denies any subjective swelling.  DRE was performed with nurse chaperone present.  Results were notable for melanotic stool.  Lab work was obtained and patient's hemoglobin was found to be 5.2.  She was consented for blood transfusion.  2 units of PRBCs were ordered.  Protonix was ordered for suspected upper GI bleed.  Patient remained stable in the ED while awaiting blood transfusion.  Patient was admitted for further management.  Final Clinical Impression(s) / ED Diagnoses Final diagnoses:  Shortness of breath  Symptomatic  anemia    Rx / DC Orders ED Discharge Orders     None        Godfrey Pick, MD 04/25/21 2204

## 2021-04-24 NOTE — ED Triage Notes (Signed)
Pt here d/t hemoglobin being 6. Pt denies blood in stools. Pt states she has been feeling weak since 04/21/21. Pt pale in triage. Alert and oriented X4. DH M/W/F. Completed full TX of HD.

## 2021-04-24 NOTE — H&P (Signed)
Date: 04/24/2021               Patient Name:  [name] MRN: [id number]  DOB: 27-Aug-1940 Age / Sex: 80 y.o., female   PCP: [name], MD         Medical Service: Internal Medicine Teaching Service         Attending Physician: Dr. Jimmye Norman, Elaina Pattee, MD    First Contact: Merrily Brittle, DO Pager: [phone]  Second Contact: Jose Persia, MD Pager: [phone]       After Hours (After 5p/  First Contact Pager: [phone]  weekends / holidays): Second Contact Pager: [phone]   SUBJECTIVE  Chief Complaint: Low hemoglobin  History of Present Illness:  [name] is a 80 y.o. female, with pertinent PMHx of ESRD (MWF), atrial fibrillation on Eliquis, T2DM, HLD, HTN, malignant lobular carcinoma of right breast status post right lumpectomy and radiation therapy, who presents to [facility] (04/24/2021) with low hemoglobin. [name]'s daughter is at bedside to assist with history.   [name] states that she has been feeling fatigued with dizziness for the past 3 days.  She had blood work obtained at HD on 9/28 that resulted today.  When she arrived at her hemodialysis center, she was instructed to go to the ED immediately due to a low hemoglobin.  Patient states that she has a past medical history of GI bleeding that presented as large volume melena.  Per chart review, EGD at that time showed gastritis, duodenitis with duodenal ulcer.  Patient daughter notes that her recent PCP visit, FOBT was obtained and negative.  At this time, she is having regular bowel movements and has not noticed any melena or hematochezia.  No episodes of emesis or hematemesis.  Patient makes some urine and that has not changed in appearance.  Patient is currently taking Eliquis twice daily for atrial fibrillation.  She denies any cough, chest pain, palpitations, vision changes.  She began to experience some shortness of breath today but feels that this is secondary to missing her hemodialysis  session. She endorses some occasional cramps in her lower extremities and headaches with hemodialysis that are chronic. Last session of dialysis on 9/28.  During evaluation, patient was noted to begin slurring her words with worsening lethargy.  Her daughter states that this is similar to prior presentation of stroke that also occurred during a GI bleed.  ED Course:  On arrival to the ED, patient was noted to be normotensive at 115/76 with heart rate of 76.  Afebrile at 98.6.  Saturating at 100% on room air.  Initial blood work remarkable for hemoglobin of 5.2.  FOBT was obtained that is positive. Blood transfusion was ordered.  IMTS paged for admission.  Meds:  No current facility-administered medications on file prior to encounter.   Current Outpatient Medications on File Prior to Encounter  Medication Sig Dispense Refill   amiodarone (PACERONE) 200 MG tablet Take 1 tablet (200 mg total) by mouth daily. 30 tablet 0   ascorbic acid (VITAMIN C) 500 MG tablet Take 500 mg by mouth See admin instructions. Take one tablet (500 mg) by mouth on Sunday, Tuesday, Thursday, Saturday mornings (non-dialysis days); take one tablet (500 mg) daily with supper     atorvastatin (LIPITOR) 20 MG tablet Take 20 mg by mouth at bedtime.     Cholecalciferol (VITAMIN D3) 25 MCG (1000 UT) CAPS Take 1,000 Units by mouth See admin instructions. Take one  tablet (1000 mg) by mouth on Sunday, Tuesday, Thursday, Saturday mornings (non-dialysis days); take one tablet (1000 mg) daily with supper     diltiazem (CARDIZEM CD) 120 MG 24 hr capsule Take 1 capsule (120 mg total) by mouth daily. (Patient taking differently: Take 120 mg by mouth every evening.) 30 capsule 0   ELIQUIS 2.5 MG TABS tablet TAKE 1 TABLET TWICE DAILY (Patient taking differently: Take 2.5 mg by mouth 2 (two) times daily.) 180 tablet 1   Insulin Degludec-Liraglutide (XULTOPHY) 100-3.6 UNIT-MG/ML SOPN Inject 16 Units into the skin every morning.     linaclotide  (LINZESS) 72 MCG capsule Take 72 mcg by mouth daily before supper.     pantoprazole (PROTONIX) 40 MG tablet Take 1 tablet (40 mg total) by mouth 2 (two) times daily. (Patient taking differently: Take 40 mg by mouth See admin instructions. Take one tablet (40 mg) by mouth on Sunday, Tuesday, Thursday, Saturday mornings (non-dialysis days); take one tablet (40 mg) by mouth daily with supper) 60 tablet 0   sevelamer carbonate (RENVELA) 800 MG tablet Take 800 mg by mouth See admin instructions. Take one tablet (800 mg) by mouth up to three times daily with meals     zinc sulfate 220 (50 Zn) MG capsule Take 220 mg by mouth See admin instructions. Take one tablet (220 mg) by mouth on Sunday, Tuesday, Thursday, Saturday mornings (non-dialysis days); take one tablet (220 mg) daily with supper     letrozole (FEMARA) 2.5 MG tablet Take 2.5 mg by mouth daily. (Patient not taking: Reported on 04/24/2021)     Allergies: Allergies as of 04/24/2021   (No Known Allergies)   Past Medical History: Past Medical History:  Diagnosis Date   Cancer ([place])    Chronic kidney disease    Diabetes mellitus    Hyperlipidemia    Hypertension    pt states at times   Family History:  Family History  Problem Relation Age of Onset   Cancer Father        bronchial   Cancer Paternal Aunt        breast   Cancer Paternal Aunt        breast    Social History: Lives - With husband; daughter lives nearby Occupation - Retired Research officer, trade [employer] - Daughter helps with IADLs, including medication administration  Level of function - Requires assistance with ADLher ADLs rights PCP - Dr. Bertram Millard Substance use - Denies any history  Social History   Tobacco Use   Smoking status: Never   Smokeless tobacco: Never  Vaping Use   Vaping Use: Never used  Substance Use Topics   Alcohol use: No   Drug use: No    OBJECTIVE:  Review of Systems: A complete ROS was negative except as per HPI.   Physical Exam: Blood pressure (!)  133/56, pulse (!) 54, temperature 98.6 F (37 C), temperature source Oral, resp. rate 15, SpO2 93 %.  Physical Exam Vitals and nursing note reviewed.  Constitutional:      Appearance: She is overweight. She is ill-appearing.  HENT:     Head: Normocephalic and atraumatic.     Mouth/Throat:     Mouth: Mucous membranes are moist.     Pharynx: Oropharynx is clear.  Eyes:     General: No scleral icterus.    Extraocular Movements: Extraocular movements intact.     Conjunctiva/sclera: Conjunctivae normal.     Pupils: Pupils are equal, round, and reactive to light.  Cardiovascular:  Rate and Rhythm: Regular rhythm. Bradycardia present.     Heart sounds: No murmur heard.   No gallop.  Pulmonary:     Effort: Pulmonary effort is normal. No respiratory distress.     Breath sounds: Decreased breath sounds (throughout) present. No wheezing, rhonchi or rales.  Abdominal:     General: Bowel sounds are normal. There is no distension.     Palpations: Abdomen is soft.     Tenderness: There is no abdominal tenderness. There is no guarding.  Musculoskeletal:     Right lower leg: Edema (trace) present.     Left lower leg: Edema (trace) present.  Skin:    General: Skin is warm and dry.     Coloration: Skin is pale.  Neurological:     Mental Status: She is lethargic and disoriented.     Comments:  On arrival, patient noted to be lethargic, however easily awakened. After several minutes, patient was noted to become more difficult to arouse. Speech became non-intelligible.    Cranial nerves intact per examination with the exemption of shoulder shrug. Left significantly weaker than the right.   Upper extremity strength unequal. 3/5 on the right. 2/5 on the left.   Lower extremity strength equal bilaterally, 4/5.   Pertinent Labs: CBC    Component Value Date/Time   WBC 8.6 04/24/2021 1308   RBC 1.84 (L) 04/24/2021 1308   HGB 5.2 (LL) 04/24/2021 1308   HGB 14.5 04/14/2015 0912   HCT 18.3  (L) 04/24/2021 1308   HCT 42.4 04/14/2015 0912   PLT 258 04/24/2021 1308   PLT 197 04/14/2015 0912   MCV 99.5 04/24/2021 1308   MCV 88.7 04/14/2015 0912   MCH 28.3 04/24/2021 1308   MCHC 28.4 (L) 04/24/2021 1308   RDW 18.2 (H) 04/24/2021 1308   RDW 13.3 04/14/2015 0912   LYMPHSABS 0.7 04/24/2021 1308   LYMPHSABS 1.2 04/14/2015 0912   MONOABS 0.5 04/24/2021 1308   MONOABS 0.6 04/14/2015 0912   EOSABS 0.0 04/24/2021 1308   EOSABS 0.1 04/14/2015 0912   BASOSABS 0.0 04/24/2021 1308   BASOSABS 0.0 04/14/2015 0912    CMP     Component Value Date/Time   NA 139 04/24/2021 1308   NA 138 04/14/2015 0912   K 5.0 04/24/2021 1308   K 4.0 04/14/2015 0912   CL 97 (L) 04/24/2021 1308   CO2 23 04/24/2021 1308   CO2 27 04/14/2015 0912   GLUCOSE 174 (H) 04/24/2021 1308   GLUCOSE 499 (H) 04/14/2015 0912   BUN 62 (H) 04/24/2021 1308   BUN 13.0 04/14/2015 0912   CREATININE 4.84 (H) 04/24/2021 1308   CREATININE 1.3 (H) 04/14/2015 0912   CALCIUM 9.4 04/24/2021 1308   CALCIUM 10.3 04/14/2015 0912   PROT 6.0 (L) 04/24/2021 1308   PROT 7.1 04/14/2015 0912   ALBUMIN 2.9 (L) 04/24/2021 1308   ALBUMIN 3.4 (L) 04/14/2015 0912   AST 17 04/24/2021 1308   AST 13 04/14/2015 0912   ALT 14 04/24/2021 1308   ALT 17 04/14/2015 0912   ALKPHOS 76 04/24/2021 1308   ALKPHOS 139 04/14/2015 0912   BILITOT 0.3 04/24/2021 1308   BILITOT 0.79 04/14/2015 0912   GFRNONAA 9 (L) 04/24/2021 1308   GFRAA 50 (L) 04/14/2011 0655   Pertinent Imaging: CT ANGIO HEAD NECK W WO CM  Result Date: 04/24/2021 CLINICAL DATA:  Focal neuro deficit, > 6 hrs, stroke suspected. EXAM: CT ANGIOGRAPHY HEAD AND NECK TECHNIQUE: Multidetector CT imaging of the head and neck  was performed using the standard protocol during bolus administration of intravenous contrast. Multiplanar CT image reconstructions and MIPs were obtained to evaluate the vascular anatomy. Carotid stenosis measurements (when applicable) are obtained utilizing NASCET  criteria, using the distal internal carotid diameter as the denominator. CONTRAST:  66mL OMNIPAQUE IOHEXOL 350 MG/ML SOLN COMPARISON:  11/07/2020 FINDINGS: CTA NECK FINDINGS Aortic arch: Standard 3 vessel aortic arch with a moderate amount of calcified plaque. No significant arch vessel origin stenosis. Right carotid system: Patent with predominantly calcified plaque at the carotid bifurcation and in the proximal ICA resulting in less than 50% stenosis. Left carotid system: Patent with predominantly calcified plaque at the carotid bifurcation resulting in less than 50% stenosis. Subtle beading of the mid to distal cervical ICA, similar to the prior CTA and which could reflect mild changes of fibromuscular dysplasia. Vertebral arteries: Patent and codominant with calcified plaque resulting in unchanged mild stenosis of the right vertebral origin. Focal moderate narrowing of the left vertebral artery at the C1-2 level which may be related to asymmetrically severe left lateral C1-2 arthropathy with prominent spurring as well as kinking of the vessel. Normal caliber of the left vertebral artery proximal and distal to this without a visible dissection flap. Skeleton: Advanced median and left lateral C1-2 arthropathy. Advanced multilevel cervical facet arthrosis. Other neck: No evidence of cervical lymphadenopathy or mass. Upper chest: Small bilateral pleural effusions. Partially visualized right jugular venous catheter. Review of the MIP images confirms the above findings CTA HEAD FINDINGS Anterior circulation: The internal carotid arteries are patent from skull base to carotid termini with similar appearance of moderate bilateral paraclinoid stenoses. ACAs and MCAs are patent without evidence of a proximal branch occlusion. Severe mid to distal right M1 and distal left M1/proximal M2 stenoses are similar to the prior CTA as well. No aneurysm is identified. Posterior circulation: The intracranial vertebral arteries are  patent to the basilar with atherosclerotic irregularity bilaterally but no high-grade stenosis. The basilar artery is widely patent. The PCAs are patent with similar appearance of severe bilateral P2 stenoses. No aneurysm is identified. Venous sinuses: As permitted by contrast timing, patent. Anatomic variants: None. Review of the MIP images confirms the above findings IMPRESSION: 1. No large vessel occlusion. 2. Similar appearance of advanced intracranial atherosclerosis with multiple moderate to severe anterior and posterior circulation stenoses as above. 3. Cervical carotid atherosclerosis without significant stenosis. 4. Mild right vertebral artery origin stenosis. 5.  Aortic Atherosclerosis (ICD10-I70.0). These results were communicated to Dr. Leonel Ramsay at 6:27 pm on 04/24/2021 by text page via the [facility] messaging system. Electronically Signed   By: Logan Bores M.D.   On: 04/24/2021 18:42   CT HEAD CODE STROKE WO CONTRAST`  Result Date: 04/24/2021 CLINICAL DATA:  Code stroke.  Neuro deficit, acute, stroke suspected EXAM: CT HEAD WITHOUT CONTRAST TECHNIQUE: Contiguous axial images were obtained from the base of the skull through the vertex without intravenous contrast. COMPARISON:  CT Head 07/16/2020. FINDINGS: Brain: No evidence of acute large vascular territory infarction, hemorrhage, hydrocephalus, extra-axial collection or mass lesion/mass effect. Moderate patchy white matter hypoattenuation, nonspecific but compatible with chronic microvascular ischemic disease. Remote right cerebellar infarct. Vascular: No hyperdense vessel identified. Calcific intracranial atherosclerosis. Skull: No acute fracture. Sinuses/Orbits: Visualized sinuses are clear. No acute orbital abnormality. Other: Trace right mastoid effusion. ASPECTS [facility] Stroke Program Early CT Score) total score (0-10 with 10 being normal): 10. IMPRESSION: 1. No evidence of acute large vascular territory infarct or acute hemorrhage. ASPECTS is  10.  2. Moderate chronic microvascular ischemic disease and remote right cerebellar infarct. Code stroke imaging results were communicated on 04/24/2021 at 5:57 pm to provider Dr. Leonel Ramsay via telephone, who verbally acknowledged these results. Electronically Signed   By: Margaretha Sheffield M.D.   On: 04/24/2021 17:59    EKG: Pending  ASSESSMENT & PLAN:  Assessment & Plan by Problem: Active Problems:   Acute blood loss anemia  DELONNA NEY is a 80 y.o. female, with pertinent PMHx of ESRD (MWF), atrial fibrillation on Eliquis, gastritis/duodenitis complicated by duodenal ulcer, previous right basal ganglia hemorrhage who presents to the ED with low hemoglobin and admitted for acute blood loss anemia.   # Acute blood loss anemia  Previous history of upper GI bleed secondary to gastritis/duodenitis with duodenal ulcer in December 2021; remains on PPI every other day. Currently presenting with symptomatic anemia for 3 days and hemoglobin of 5.2 (baseline ~ 9). No melena or hematochezia per history with no evidence of tachycardia/hypotension supports this may be a slow oozing bleed rather than brisk one. Of note, last colonoscopy in chart was in 2013, one polyp removed.  Will need to transfuse cautiously due to ESRD status with missed HD today and hypervolemia on examination. Will start with only 1 unit and recheck blood work.   - Transfuse 1 unit of pRBCs - Recheck CBC post-transfusion - Transfuse PRN for hemoglobin < 7 - Plan to consult GI in the AM - Protonix 40 mg IV BID - Hold home Linzess   # Acute Neurological Deficits  Rapid onset speech and muscle weakness during history-taking with deficits as noted above. Code stroke called and CT head obtain emergently. No evidence of hemorrhage or acute infarct. CTA of head/neck demonstrates stable intracranial atherosclerosis. Differential at this time include hypo-perfusion event in the setting of profound anemia leading to ischemia in the area of  high grade stenosis noted in the right and left MCA.   - Neurology consulted; greatly appreciate their recommendations - Consider MRI brain once stabilized  - NPO pending swallow study   # ESRD (MWF) Secondary to diabetic nephrosclerosis. Follows with [facility].   - Nephrology consulted - Tentatively plan for HD tomorrow  # Sinus Bradycardia # Hx of Atrial Fibrillation  Currently on Diltiazem and Amiodarone. Anticoagulated with Eliquis. HAS-BLED score of 4.   - Hold Amiodarone and Diltiazem in light of bradycardia, as it may be contributing to lethargy - Hold Eliquis in light of acute anemia   # Type 2 Diabetes Mellitus  On Xultophy at home.   - SSI (sensitive)   Dispo: Admit patient to Observation with expected length of stay less than 2 midnights.  Signed: Dr. Jose Persia Internal Medicine PGY-3 Pager: [phone] After 5pm on weekdays and 1pm on weekends: On Call pager [phone]  04/24/2021, 7:06 PM

## 2021-04-24 NOTE — ED Notes (Signed)
MD made aware of pt hemoglobin 6.5

## 2021-04-24 NOTE — Consult Note (Addendum)
Neurology Consultation  Reason for Consult: brief stroke like symptoms.  Referring Physician: Lenise Herald, MD.   CC: left sided weakness and garbled speech with glassy eyed staring.   History is obtained from: patient and daughter.   HPI: [name] is a 80 y.o. female with a PMHx of AF on Eliquis, DM II, HLD, HTN, diabetic nephrosclerosis, ESRD on HD M, W, F since 04/2020, GIB, stroke in 2019 and 2021. Patient was admitted today for anemia after Hgb checked at HD revealing Hgb of 6. Her occult stool was + and she has a hx of GIBs.   Patient had an episode of garbled speech and left arm weakness while in ED with drift mimicking her prior symptoms before her old stroke. Per MD, her left trapezius was weak on exam about stroke like symptoms. RN said her eyes were glazed over. No LOC but patient was staring and glassy eyed. No history of seizures, but + FMHx.   Patient states she has chronic issues with her eyes. Left one is more blurry than right.   Daughter states the staring episodes usually accompany HD.   Saw her oncologist on 04/21/21 for s/up right breast cancer. 02/2021 mammogram showed no evidence of malignancy. She stopped her antiestrogen therapy because she completed 10 years of taking.   ROS: A robust ROS was performed and is negative except as noted in the HPI. Very fatigued. No SOB or chest pain.   Past Medical History:  Diagnosis Date   Cancer ([place])    Chronic kidney disease    Diabetes mellitus    Hyperlipidemia    Hypertension    pt states at times   Family History  Problem Relation Age of Onset   Cancer Father        bronchial   Cancer Paternal Aunt        breast   Cancer Paternal Aunt        breast   Social History:   reports that she has never smoked. She has never used smokeless tobacco. She reports that she does not drink alcohol and does not use drugs.  Medications  Current Facility-Administered Medications:    0.9 %  sodium chloride infusion, 10  mL/hr, Intravenous, Once, Godfrey Pick, MD   acetaminophen (TYLENOL) tablet 650 mg, 650 mg, Oral, Q6H PRN **OR** acetaminophen (TYLENOL) suppository 650 mg, 650 mg, Rectal, Q6H PRN, Jose Persia, MD   [START ON 04/25/2021] insulin aspart (novoLOG) injection 0-6 Units, 0-6 Units, Subcutaneous, TID WC, Jose Persia, MD   pantoprazole (PROTONIX) injection 40 mg, 40 mg, Intravenous, Q12H, Charleen Kirks, [place], MD   sodium chloride flush (NS) 0.9 % injection 3 mL, 3 mL, Intravenous, Q12H, Jose Persia, MD  Current Outpatient Medications:    amiodarone (PACERONE) 200 MG tablet, Take 1 tablet (200 mg total) by mouth daily., Disp: 30 tablet, Rfl: 0   ascorbic acid (VITAMIN C) 500 MG tablet, Take 500 mg by mouth See admin instructions. Take one tablet (500 mg) by mouth on Sunday, Tuesday, Thursday, Saturday mornings (non-dialysis days); take one tablet (500 mg) daily with supper, Disp: , Rfl:    atorvastatin (LIPITOR) 20 MG tablet, Take 20 mg by mouth at bedtime., Disp: , Rfl:    Cholecalciferol (VITAMIN D3) 25 MCG (1000 UT) CAPS, Take 1,000 Units by mouth See admin instructions. Take one tablet (1000 mg) by mouth on Sunday, Tuesday, Thursday, Saturday mornings (non-dialysis days); take one tablet (1000 mg) daily with supper, Disp: , Rfl:  diltiazem (CARDIZEM CD) 120 MG 24 hr capsule, Take 1 capsule (120 mg total) by mouth daily. (Patient taking differently: Take 120 mg by mouth every evening.), Disp: 30 capsule, Rfl: 0   ELIQUIS 2.5 MG TABS tablet, TAKE 1 TABLET TWICE DAILY (Patient taking differently: Take 2.5 mg by mouth 2 (two) times daily.), Disp: 180 tablet, Rfl: 1   Insulin Degludec-Liraglutide (XULTOPHY) 100-3.6 UNIT-MG/ML SOPN, Inject 16 Units into the skin every morning., Disp: , Rfl:    linaclotide (LINZESS) 72 MCG capsule, Take 72 mcg by mouth daily before supper., Disp: , Rfl:    pantoprazole (PROTONIX) 40 MG tablet, Take 1 tablet (40 mg total) by mouth 2 (two) times daily. (Patient taking  differently: Take 40 mg by mouth See admin instructions. Take one tablet (40 mg) by mouth on Sunday, Tuesday, Thursday, Saturday mornings (non-dialysis days); take one tablet (40 mg) by mouth daily with supper), Disp: 60 tablet, Rfl: 0   sevelamer carbonate (RENVELA) 800 MG tablet, Take 800 mg by mouth See admin instructions. Take one tablet (800 mg) by mouth up to three times daily with meals, Disp: , Rfl:    zinc sulfate 220 (50 Zn) MG capsule, Take 220 mg by mouth See admin instructions. Take one tablet (220 mg) by mouth on Sunday, Tuesday, Thursday, Saturday mornings (non-dialysis days); take one tablet (220 mg) daily with supper, Disp: , Rfl:    letrozole (FEMARA) 2.5 MG tablet, Take 2.5 mg by mouth daily. (Patient not taking: Reported on 04/24/2021), Disp: , Rfl:   Exam: Current vital signs: BP (!) 133/56   Pulse (!) 54   Temp 98.6 F (37 C) (Oral)   Resp 15   SpO2 93%  Vital signs in last 24 hours: Temp:  [98.6 F (37 C)-99 F (37.2 C)] 98.6 F (37 C) (09/30 1820) Pulse Rate:  [53-82] 54 (09/30 1820) Resp:  [15-22] 15 (09/30 1820) BP: (110-133)/(56-79) 133/56 (09/30 1820) SpO2:  [93 %-100 %] 93 % (09/30 1820)  PE: GENERAL: Acutely ill appearing elderly female. She is not toxic appearing. Awake, alert in NAD.  HEENT: normocephalic and atraumatic. LUNGS - Normal respiratory effort.  CV - RRR-some bradycardia on tele. ABDOMEN - Soft, nontender. Ext: warm, well perfused. Skin: pallor.  Psych: affect light.    NEURO:  Mental Status: Alert and oriented x4.  Speech/Language: speech is without dysarthria or aphasia.  Naming, repetition, fluency, and comprehension intact.  Cranial Nerves:  II: PERRL.Visual fields full.  III, IV, VI: EOMI. Lid elevation symmetric and full.  V: sensation is intact and symmetrical to face.  VII: Smile is symmetrical.  VIII:hearing intact to voice. IX, X: palate elevation is symmetric. Phonation normal.  XI: normal sternocleidomastoid and  trapezius muscle strength. PTW:SFKCLE is symmetrical without fasciculations.   Motor:  RUE: grips  5/5       triceps 5/5     biceps  5/5      LUE: grips  5/5      triceps  5/5      biceps   5/5 RLE:  knee-can not perform due to arthritis pain. Plantar flexion 5/5.  dorsiflexion   0/5 with R foot inverted. LLE:  knee  5/5   thigh  5/5     plantar flexion   5/5     dorsiflexion   5/5 Tone is normal. Bulk is normal.  Sensation- Intact to light touch bilaterally in all four extremities. Extinction absent to DSS.  Coordination: FTN intact bilaterally. HKS unable to perform.  No drift in BUEs or BLEs.  DTRs:  RUE:  brachioradialis 2     RLE:  patella 0   LUE:  brachioradialis 2     LLE: patella 0  Gait- deferred.  NIHSS:  1a Level of Consciousness: 0 1b LOC Questions: 0 1c LOC Commands: 0 2 Best Gaze: 0 3 Visual: 0 4 Facial Palsy: 0 5a Motor Arm - left: 0 5b Motor Arm - Right: 0 6a Motor Leg - Left: 0 6b Motor Leg - Right: 0 7 Limb Ataxia: 0 8 Sensory: 0 9 Best Language: 0 10 Dysarthria: 0 11 Extinction and Inattention: 0 TOTAL: 0  Labs I have reviewed labs in epic and the results pertinent to this consultation are:  CBC    Component Value Date/Time   WBC 8.6 04/24/2021 1308   RBC 1.84 (L) 04/24/2021 1308   HGB 5.2 (LL) 04/24/2021 1308   HGB 14.5 04/14/2015 0912   HCT 18.3 (L) 04/24/2021 1308   HCT 42.4 04/14/2015 0912   PLT 258 04/24/2021 1308   PLT 197 04/14/2015 0912   MCV 99.5 04/24/2021 1308   MCV 88.7 04/14/2015 0912   MCH 28.3 04/24/2021 1308   MCHC 28.4 (L) 04/24/2021 1308   RDW 18.2 (H) 04/24/2021 1308   RDW 13.3 04/14/2015 0912   LYMPHSABS 0.7 04/24/2021 1308   LYMPHSABS 1.2 04/14/2015 0912   MONOABS 0.5 04/24/2021 1308   MONOABS 0.6 04/14/2015 0912   EOSABS 0.0 04/24/2021 1308   EOSABS 0.1 04/14/2015 0912   BASOSABS 0.0 04/24/2021 1308   BASOSABS 0.0 04/14/2015 0912    CMP     Component Value Date/Time   NA 139 04/24/2021 1308   NA 138  04/14/2015 0912   K 5.0 04/24/2021 1308   K 4.0 04/14/2015 0912   CL 97 (L) 04/24/2021 1308   CO2 23 04/24/2021 1308   CO2 27 04/14/2015 0912   GLUCOSE 174 (H) 04/24/2021 1308   GLUCOSE 499 (H) 04/14/2015 0912   BUN 62 (H) 04/24/2021 1308   BUN 13.0 04/14/2015 0912   CREATININE 4.84 (H) 04/24/2021 1308   CREATININE 1.3 (H) 04/14/2015 0912   CALCIUM 9.4 04/24/2021 1308   CALCIUM 10.3 04/14/2015 0912   PROT 6.0 (L) 04/24/2021 1308   PROT 7.1 04/14/2015 0912   ALBUMIN 2.9 (L) 04/24/2021 1308   ALBUMIN 3.4 (L) 04/14/2015 0912   AST 17 04/24/2021 1308   AST 13 04/14/2015 0912   ALT 14 04/24/2021 1308   ALT 17 04/14/2015 0912   ALKPHOS 76 04/24/2021 1308   ALKPHOS 139 04/14/2015 0912   BILITOT 0.3 04/24/2021 1308   BILITOT 0.79 04/14/2015 0912   GFRNONAA 9 (L) 04/24/2021 1308   GFRAA 50 (L) 04/14/2011 0655    Lipid Panel     Component Value Date/Time   CHOL 99 07/14/2020 0232   TRIG 178 (H) 07/14/2020 0232   HDL 31 (L) 07/14/2020 0232   CHOLHDL 3.2 07/14/2020 0232   VLDL 36 07/14/2020 0232   LDLCALC 32 07/14/2020 0232   Imaging MD reviewed the images obtained.   CT head -No evidence of acute large vascular territory infarct or acute hemorrhage. ASPECTS is 10. -Moderate chronic microvascular ischemic disease and remote right cerebellar infarct.  CTA head and neck -No large vessel occlusion. -Similar appearance of advanced intracranial atherosclerosis with multiple moderate to severe anterior and posterior circulation stenoses as above. -Cervical carotid atherosclerosis without significant stenosis. -Mild right vertebral artery origin stenosis. -Aortic Atherosclerosis (ICD10-I70.0).   Assessment: 80 yo  female with stroke risk factors of prior CVA x 2, HLD, HTN, AF, DM II, and advanced age. Given her Hgb is 5.2, her symptoms of left sided weakness and garbled speech are most likely due to hypoperfusion with recrudescence of old strokes. Her exam on consult does not  reveal any neurological deficits. Her staring spells are likely not seizures as her past stroke areas do not usually cause seizures (discussed with Dr. Milas Gain). Do not feel that other neurology work up needed at time.   Impression: -Hypoperfusion related to Hgb 5.2 and arterial stenosis and atherosclerosis on CTA.  -Recrudescence of old stroke symptoms due to #1.   Recommendations/Plan:  -Eliquis per teaching service, but is not ordered.  -Stay on Lipitor. May want to check lipid profile to see if dose needs to be increased given intra/extracranial stenosis on CTA.   Pt seen by Clance Boll, NP/Neuro and later by MD. Note/plan to be edited by MD as needed.  Pager: [phone]

## 2021-04-24 NOTE — ED Notes (Signed)
Pt's daughter adamantly refused covid test for her mother, stating that she is not having any symptoms and "you cannot take away my rights". Attempted to explain to daughter that if pt is admitted she will need a covid test. Daughter is very argumentative and will absolutely not allow her mother to be swabbed.

## 2021-04-24 NOTE — ED Notes (Signed)
Per blood bank, pt must have CBC rechecked after the first unit of blood has been fully transfused, before second unit is to be released. MD made aware.

## 2021-04-25 ENCOUNTER — Observation Stay (HOSPITAL_COMMUNITY): Payer: Medicare HMO

## 2021-04-25 DIAGNOSIS — K922 Gastrointestinal hemorrhage, unspecified: Secondary | ICD-10-CM | POA: Diagnosis not present

## 2021-04-25 DIAGNOSIS — Z8679 Personal history of other diseases of the circulatory system: Secondary | ICD-10-CM | POA: Diagnosis not present

## 2021-04-25 DIAGNOSIS — M21371 Foot drop, right foot: Secondary | ICD-10-CM | POA: Diagnosis present

## 2021-04-25 DIAGNOSIS — K589 Irritable bowel syndrome without diarrhea: Secondary | ICD-10-CM | POA: Diagnosis present

## 2021-04-25 DIAGNOSIS — Y848 Other medical procedures as the cause of abnormal reaction of the patient, or of later complication, without mention of misadventure at the time of the procedure: Secondary | ICD-10-CM | POA: Diagnosis not present

## 2021-04-25 DIAGNOSIS — J810 Acute pulmonary edema: Secondary | ICD-10-CM | POA: Diagnosis present

## 2021-04-25 DIAGNOSIS — Z992 Dependence on renal dialysis: Secondary | ICD-10-CM | POA: Diagnosis not present

## 2021-04-25 DIAGNOSIS — E611 Iron deficiency: Secondary | ICD-10-CM | POA: Diagnosis present

## 2021-04-25 DIAGNOSIS — K31819 Angiodysplasia of stomach and duodenum without bleeding: Secondary | ICD-10-CM | POA: Diagnosis not present

## 2021-04-25 DIAGNOSIS — Z8711 Personal history of peptic ulcer disease: Secondary | ICD-10-CM | POA: Diagnosis not present

## 2021-04-25 DIAGNOSIS — D62 Acute posthemorrhagic anemia: Secondary | ICD-10-CM

## 2021-04-25 DIAGNOSIS — N2581 Secondary hyperparathyroidism of renal origin: Secondary | ICD-10-CM | POA: Diagnosis present

## 2021-04-25 DIAGNOSIS — E8779 Other fluid overload: Secondary | ICD-10-CM | POA: Diagnosis not present

## 2021-04-25 DIAGNOSIS — I4891 Unspecified atrial fibrillation: Secondary | ICD-10-CM | POA: Diagnosis present

## 2021-04-25 DIAGNOSIS — Z853 Personal history of malignant neoplasm of breast: Secondary | ICD-10-CM | POA: Diagnosis not present

## 2021-04-25 DIAGNOSIS — D649 Anemia, unspecified: Secondary | ICD-10-CM | POA: Diagnosis present

## 2021-04-25 DIAGNOSIS — E1122 Type 2 diabetes mellitus with diabetic chronic kidney disease: Secondary | ICD-10-CM | POA: Diagnosis present

## 2021-04-25 DIAGNOSIS — J90 Pleural effusion, not elsewhere classified: Secondary | ICD-10-CM | POA: Diagnosis not present

## 2021-04-25 DIAGNOSIS — I120 Hypertensive chronic kidney disease with stage 5 chronic kidney disease or end stage renal disease: Secondary | ICD-10-CM | POA: Diagnosis present

## 2021-04-25 DIAGNOSIS — K31811 Angiodysplasia of stomach and duodenum with bleeding: Secondary | ICD-10-CM | POA: Diagnosis present

## 2021-04-25 DIAGNOSIS — D631 Anemia in chronic kidney disease: Secondary | ICD-10-CM | POA: Diagnosis present

## 2021-04-25 DIAGNOSIS — J9601 Acute respiratory failure with hypoxia: Secondary | ICD-10-CM | POA: Diagnosis present

## 2021-04-25 DIAGNOSIS — J811 Chronic pulmonary edema: Secondary | ICD-10-CM | POA: Diagnosis not present

## 2021-04-25 DIAGNOSIS — I6930 Unspecified sequelae of cerebral infarction: Secondary | ICD-10-CM | POA: Diagnosis not present

## 2021-04-25 DIAGNOSIS — N186 End stage renal disease: Secondary | ICD-10-CM | POA: Diagnosis present

## 2021-04-25 DIAGNOSIS — R0602 Shortness of breath: Secondary | ICD-10-CM | POA: Diagnosis not present

## 2021-04-25 DIAGNOSIS — M898X9 Other specified disorders of bone, unspecified site: Secondary | ICD-10-CM | POA: Diagnosis present

## 2021-04-25 DIAGNOSIS — N250 Renal osteodystrophy: Secondary | ICD-10-CM | POA: Diagnosis not present

## 2021-04-25 DIAGNOSIS — I953 Hypotension of hemodialysis: Secondary | ICD-10-CM | POA: Diagnosis not present

## 2021-04-25 DIAGNOSIS — E8771 Transfusion associated circulatory overload: Secondary | ICD-10-CM | POA: Diagnosis not present

## 2021-04-25 DIAGNOSIS — Z923 Personal history of irradiation: Secondary | ICD-10-CM | POA: Diagnosis not present

## 2021-04-25 DIAGNOSIS — K2289 Other specified disease of esophagus: Secondary | ICD-10-CM | POA: Diagnosis not present

## 2021-04-25 DIAGNOSIS — E785 Hyperlipidemia, unspecified: Secondary | ICD-10-CM | POA: Diagnosis present

## 2021-04-25 DIAGNOSIS — Z20822 Contact with and (suspected) exposure to covid-19: Secondary | ICD-10-CM | POA: Diagnosis present

## 2021-04-25 LAB — HEPATITIS B SURFACE ANTIGEN: Hepatitis B Surface Ag: NONREACTIVE

## 2021-04-25 LAB — CBC WITH DIFFERENTIAL/PLATELET
Abs Immature Granulocytes: 0.05 10*3/uL (ref 0.00–0.07)
Basophils Absolute: 0 10*3/uL (ref 0.0–0.1)
Basophils Relative: 0 %
Eosinophils Absolute: 0 10*3/uL (ref 0.0–0.5)
Eosinophils Relative: 0 %
HCT: 26.1 % — ABNORMAL LOW (ref 36.0–46.0)
Hemoglobin: 8 g/dL — ABNORMAL LOW (ref 12.0–15.0)
Immature Granulocytes: 1 %
Lymphocytes Relative: 6 %
Lymphs Abs: 0.6 10*3/uL — ABNORMAL LOW (ref 0.7–4.0)
MCH: 28.2 pg (ref 26.0–34.0)
MCHC: 30.7 g/dL (ref 30.0–36.0)
MCV: 91.9 fL (ref 80.0–100.0)
Monocytes Absolute: 0.6 10*3/uL (ref 0.1–1.0)
Monocytes Relative: 6 %
Neutro Abs: 8.3 10*3/uL — ABNORMAL HIGH (ref 1.7–7.7)
Neutrophils Relative %: 87 %
Platelets: 246 10*3/uL (ref 150–400)
RBC: 2.84 MIL/uL — ABNORMAL LOW (ref 3.87–5.11)
RDW: 17.6 % — ABNORMAL HIGH (ref 11.5–15.5)
WBC: 9.6 10*3/uL (ref 4.0–10.5)
nRBC: 0 % (ref 0.0–0.2)

## 2021-04-25 LAB — CBC
HCT: 26.8 % — ABNORMAL LOW (ref 36.0–46.0)
Hemoglobin: 8.4 g/dL — ABNORMAL LOW (ref 12.0–15.0)
MCH: 28.1 pg (ref 26.0–34.0)
MCHC: 31.3 g/dL (ref 30.0–36.0)
MCV: 89.6 fL (ref 80.0–100.0)
Platelets: 270 10*3/uL (ref 150–400)
RBC: 2.99 MIL/uL — ABNORMAL LOW (ref 3.87–5.11)
RDW: 17.5 % — ABNORMAL HIGH (ref 11.5–15.5)
WBC: 18.2 10*3/uL — ABNORMAL HIGH (ref 4.0–10.5)
nRBC: 0 % (ref 0.0–0.2)

## 2021-04-25 LAB — GLUCOSE, CAPILLARY
Glucose-Capillary: 156 mg/dL — ABNORMAL HIGH (ref 70–99)
Glucose-Capillary: 116 mg/dL — ABNORMAL HIGH (ref 70–99)
Glucose-Capillary: 141 mg/dL — ABNORMAL HIGH (ref 70–99)

## 2021-04-25 LAB — CBG MONITORING, ED: Glucose-Capillary: 133 mg/dL — ABNORMAL HIGH (ref 70–99)

## 2021-04-25 LAB — RENAL FUNCTION PANEL
Albumin: 2.7 g/dL — ABNORMAL LOW (ref 3.5–5.0)
Anion gap: 17 — ABNORMAL HIGH (ref 5–15)
BUN: 72 mg/dL — ABNORMAL HIGH (ref 8–23)
CO2: 23 mmol/L (ref 22–32)
Calcium: 9.3 mg/dL (ref 8.9–10.3)
Chloride: 97 mmol/L — ABNORMAL LOW (ref 98–111)
Creatinine, Ser: 5.49 mg/dL — ABNORMAL HIGH (ref 0.44–1.00)
GFR, Estimated: 7 mL/min — ABNORMAL LOW (ref >60)
Glucose, Bld: 136 mg/dL — ABNORMAL HIGH (ref 70–99)
Phosphorus: 7.5 mg/dL — ABNORMAL HIGH (ref 2.5–4.6)
Potassium: 5.3 mmol/L — ABNORMAL HIGH (ref 3.5–5.1)
Sodium: 137 mmol/L (ref 135–145)

## 2021-04-25 LAB — HEPATITIS B SURFACE ANTIBODY,QUALITATIVE: Hep B S Ab: REACTIVE — AB

## 2021-04-25 MED ORDER — HEPARIN SODIUM (PORCINE) 1000 UNIT/ML DIALYSIS
1000.0000 [IU] | INTRAMUSCULAR | Status: DC | PRN
  Filled 2021-04-25 (×2): qty 1

## 2021-04-25 MED ORDER — LIDOCAINE-PRILOCAINE 2.5-2.5 % EX CREA: 1.0000 "application " | TOPICAL_CREAM | CUTANEOUS | Status: DC | PRN

## 2021-04-25 MED ORDER — CHLORHEXIDINE GLUCONATE CLOTH 2 % EX PADS
6.0000 | MEDICATED_PAD | Freq: Every day | CUTANEOUS | Status: DC
  Administered 2021-04-26 – 2021-04-29 (×3): 6 via TOPICAL

## 2021-04-25 MED ORDER — SODIUM CHLORIDE 0.9 % IV SOLN: 100.0000 mL | INTRAVENOUS | Status: DC | PRN

## 2021-04-25 MED ORDER — PENTAFLUOROPROP-TETRAFLUOROETH EX AERO: 1.0000 "application " | INHALATION_SPRAY | CUTANEOUS | Status: DC | PRN

## 2021-04-25 MED ORDER — HEPARIN SODIUM (PORCINE) 1000 UNIT/ML IJ SOLN
3800.0000 [IU] | INTRAMUSCULAR | Status: DC | PRN
  Administered 2021-04-25: 3800 [IU] via INTRAVENOUS
  Filled 2021-04-25: qty 3.8

## 2021-04-25 MED ORDER — FUROSEMIDE 10 MG/ML IJ SOLN
80.0000 mg | Freq: Once | INTRAMUSCULAR | Status: AC
  Administered 2021-04-25: 80 mg via INTRAVENOUS
  Filled 2021-04-25: qty 8

## 2021-04-25 MED ORDER — ALTEPLASE 2 MG IJ SOLR: 2.0000 mg | Freq: Once | INTRAMUSCULAR | Status: DC | PRN

## 2021-04-25 MED ORDER — LIDOCAINE HCL (PF) 1 % IJ SOLN: 5.0000 mL | INTRAMUSCULAR | Status: DC | PRN

## 2021-04-25 MED ORDER — ALBUMIN HUMAN 25 % IV SOLN
12.5000 g | Freq: Once | INTRAVENOUS | Status: AC
  Administered 2021-04-25: 12.5 g via INTRAVENOUS

## 2021-04-25 NOTE — Progress Notes (Signed)
Patient was on NRB sating 100%. Placed on 5 LPM nasal cannula. Patient is in no distress. SPO2 100%. BBS clear; diminished. Patient able to speak in full sentences. BIPAP and NRB still in room. RT will continue to monitor.

## 2021-04-25 NOTE — ED Notes (Signed)
Breakfast Ordered 

## 2021-04-25 NOTE — Consult Note (Signed)
[facility] Renal Consultation Note  Indication for Consultation:  Management of ESRD/hemodialysis; anemia, hypertension/volume and secondary hyperparathyroidism  HPI: [name] is a 80 y.o. female  with ESRD on HD mwf ,(compliant with treatments )HTN, HLD, b/l hip fractures, breast cancer s/p treatment > 5 years ago, 12/18-/21-01/05/22 COVID 52, Acute hemorrhagic CVA, GIB, Afib.(On Eliquis). Now admitted with symptomatic anemia =Hgb 5.2 with GI bleed =heme positive stool in ER .  Prior history of gastritis duodenitis duodenal ulcer December 2021.  She missed her last HD yesterday Friday secondary to feeling weak with dizziness past 3 days, with outpatient hemoglobin 6 at [facility] was told to come to the ER for transfusion evaluation.  No reported melena or hematochezia and at recent PCP visit FOBT was negative per daughter who gives history.  Denies chest pain, cough palpitations abdominal discomfort.  In ER yesterday had some left-sided weakness ."  Symptom similar prior remote stroke .".  She was evaluated by neurology yesterday and signed off.  CT head no evidence of acute infarct or hemorrhage this a.m. hemoglobin up to 8 after transfusion left-sided weakness resolving currently admitted in 4 E. room with rebreather mask on chest x-ray showing some pulmonary edema /L Pl effusion =plans for dialysis today      Past Medical History:  Diagnosis Date   Cancer ([place])    Chronic kidney disease    Diabetes mellitus    Hyperlipidemia    Hypertension    pt states at times    Past Surgical History:  Procedure Laterality Date   BIOPSY  07/17/2020   Procedure: BIOPSY;  Surgeon: Yetta Flock, MD;  Location: [facility] ENDOSCOPY;  Service: Gastroenterology;;   BREAST LUMPECTOMY  03/26/2011   right   ESOPHAGOGASTRODUODENOSCOPY (EGD) WITH PROPOFOL N/A 07/17/2020   Procedure: ESOPHAGOGASTRODUODENOSCOPY (EGD) WITH PROPOFOL;  Surgeon: Yetta Flock, MD;  Location: [facility]  ENDOSCOPY;  Service: Gastroenterology;  Laterality: N/A;      Family History  Problem Relation Age of Onset   Cancer Father        bronchial   Cancer Paternal Aunt        breast   Cancer Paternal Aunt        breast      reports that she has never smoked. She has never used smokeless tobacco. She reports that she does not drink alcohol and does not use drugs.  No Known Allergies  Prior to Admission medications   Medication Sig Start Date End Date Taking? Authorizing Provider  amiodarone (PACERONE) 200 MG tablet Take 1 tablet (200 mg total) by mouth daily. 07/22/20  Yes Thurnell Lose, MD  ascorbic acid (VITAMIN C) 500 MG tablet Take 500 mg by mouth See admin instructions. Take one tablet (500 mg) by mouth on Sunday, Tuesday, Thursday, Saturday mornings (non-dialysis days); take one tablet (500 mg) daily with supper   Yes [provider]  atorvastatin (LIPITOR) 20 MG tablet Take 20 mg by mouth at bedtime.   Yes [provider]  Cholecalciferol (VITAMIN D3) 25 MCG (1000 UT) CAPS Take 1,000 Units by mouth See admin instructions. Take one tablet (1000 mg) by mouth on Sunday, Tuesday, Thursday, Saturday mornings (non-dialysis days); take one tablet (1000 mg) daily with supper   Yes [provider]  diltiazem (CARDIZEM CD) 120 MG 24 hr capsule Take 1 capsule (120 mg total) by mouth daily. Patient taking differently: Take 120 mg by mouth every evening. 07/22/20  Yes Thurnell Lose, MD  ELIQUIS 2.5 MG TABS tablet TAKE 1 TABLET TWICE DAILY Patient taking differently: Take 2.5 mg by mouth 2 (two) times daily. 01/30/21  Yes [name], MD  Insulin Degludec-Liraglutide (XULTOPHY) 100-3.6 UNIT-MG/ML SOPN Inject 16 Units into the skin every morning.   Yes [provider]  linaclotide (LINZESS) 72 MCG capsule Take 72 mcg by mouth daily before supper.   Yes [provider]  pantoprazole (PROTONIX) 40 MG tablet Take 1 tablet (40 mg total) by mouth 2  (two) times daily. Patient taking differently: Take 40 mg by mouth See admin instructions. Take one tablet (40 mg) by mouth on Sunday, Tuesday, Thursday, Saturday mornings (non-dialysis days); take one tablet (40 mg) by mouth daily with supper 07/22/20  Yes Thurnell Lose, MD  sevelamer carbonate (RENVELA) 800 MG tablet Take 800 mg by mouth See admin instructions. Take one tablet (800 mg) by mouth up to three times daily with meals 05/20/20  Yes [provider]  zinc sulfate 220 (50 Zn) MG capsule Take 220 mg by mouth See admin instructions. Take one tablet (220 mg) by mouth on Sunday, Tuesday, Thursday, Saturday mornings (non-dialysis days); take one tablet (220 mg) daily with supper   Yes [provider]  letrozole (FEMARA) 2.5 MG tablet Take 2.5 mg by mouth daily. Patient not taking: Reported on 04/24/2021    [provider]      Results for orders placed or performed during the hospital encounter of 04/24/21 (from the past 48 hour(s))  Type and screen [place]     Status: None (Preliminary result)   Collection Time: 04/24/21  1:05 PM  Result Value Ref Range   ABO/RH(D) O POS    Antibody Screen NEG    Sample Expiration 04/27/2021,2359    Unit Number [id number]    Blood Component Type RED CELLS,LR    Unit division 00    Status of Unit ISSUED    Transfusion Status OK TO TRANSFUSE    Crossmatch Result Compatible    Unit Number [id number]    Blood Component Type RBC LR PHER1    Unit division 00    Status of Unit ISSUED    Transfusion Status OK TO TRANSFUSE    Crossmatch Result      Compatible Performed at [facility], [place]. [address] [place], [place] [address]   CBC with Differential     Status: Abnormal   Collection Time: 04/24/21  1:08 PM  Result Value Ref Range   WBC 8.6 4.0 - 10.5 K/uL   RBC 1.84 (L) 3.87 - 5.11 MIL/uL   Hemoglobin 5.2 (LL) 12.0 - 15.0 g/dL    Comment: REPEATED TO VERIFY THIS CRITICAL RESULT HAS  VERIFIED AND BEEN CALLED TO ZOE CAGLE,RN BY BILLEE WYLIE ON 09 30 2022 AT 1519, AND HAS BEEN READ BACK.  THIS CRITICAL RESULT HAS VERIFIED AND BEEN CALLED TO ZOE CAGLE,RN BY BILLEE WYLIE ON 09 30 2022 AT 9798, AND HAS BEEN READ BACK.     HCT 18.3 (L) 36.0 - 46.0 %   MCV 99.5 80.0 - 100.0 fL   MCH 28.3 26.0 - 34.0 pg   MCHC 28.4 (L) 30.0 - 36.0 g/dL   RDW 18.2 (H) 11.5 - 15.5 %   Platelets 258 150 - 400 K/uL   nRBC 0.0 0.0 - 0.2 %   Neutrophils Relative % 86 %   Neutro Abs 7.4 1.7 - 7.7 K/uL   Lymphocytes Relative 8 %   Lymphs  Abs 0.7 0.7 - 4.0 K/uL   Monocytes Relative 6 %   Monocytes Absolute 0.5 0.1 - 1.0 K/uL   Eosinophils Relative 0 %   Eosinophils Absolute 0.0 0.0 - 0.5 K/uL   Basophils Relative 0 %   Basophils Absolute 0.0 0.0 - 0.1 K/uL   Immature Granulocytes 0 %   Abs Immature Granulocytes 0.03 0.00 - 0.07 K/uL    Comment: Performed at [facility], [place] [address], [place], [place] [address]  Comprehensive metabolic panel     Status: Abnormal   Collection Time: 04/24/21  1:08 PM  Result Value Ref Range   Sodium 139 135 - 145 mmol/L   Potassium 5.0 3.5 - 5.1 mmol/L   Chloride 97 (L) 98 - 111 mmol/L   CO2 23 22 - 32 mmol/L   Glucose, Bld 174 (H) 70 - 99 mg/dL    Comment: Glucose reference range applies only to samples taken after fasting for at least 8 hours.   BUN 62 (H) 8 - 23 mg/dL   Creatinine, Ser 4.84 (H) 0.44 - 1.00 mg/dL   Calcium 9.4 8.9 - 10.3 mg/dL   Total Protein 6.0 (L) 6.5 - 8.1 g/dL   Albumin 2.9 (L) 3.5 - 5.0 g/dL   AST 17 15 - 41 U/L   ALT 14 0 - 44 U/L   Alkaline Phosphatase 76 38 - 126 U/L   Total Bilirubin 0.3 0.3 - 1.2 mg/dL   GFR, Estimated 9 (L) >60 mL/min    Comment: (NOTE) Calculated using the CKD-EPI Creatinine Equation (2021)    Anion gap 19 (H) 5 - 15    Comment: Performed at [facility], [place] [address] [place], [place] [address]  Resp Panel by RT-PCR (Flu A&B, Covid) Nasopharyngeal Swab     Status: None   Collection  Time: 04/24/21  2:08 PM   Specimen: Nasopharyngeal Swab; Nasopharyngeal(NP) swabs in vial transport medium  Result Value Ref Range   SARS Coronavirus 2 by RT PCR NEGATIVE NEGATIVE    Comment: (NOTE) SARS-CoV-2 target nucleic acids are NOT DETECTED.  The SARS-CoV-2 RNA is generally detectable in upper respiratory specimens during the acute phase of infection. The lowest concentration of SARS-CoV-2 viral copies this assay can detect is 138 copies/mL. A negative result does not preclude SARS-Cov-2 infection and should not be used as the sole basis for treatment or other patient management decisions. A negative result may occur with  improper specimen collection/handling, submission of specimen other than nasopharyngeal swab, presence of viral mutation(s) within the areas targeted by this assay, and inadequate number of viral copies(<138 copies/mL). A negative result must be combined with clinical observations, patient history, and epidemiological information. The expected result is Negative.  Fact Sheet for Patients:  EntrepreneurPulse.com.au  Fact Sheet for Healthcare Providers:  IncredibleEmployment.be  This test is no t yet approved or cleared by the [place] FDA and  has been authorized for detection and/or diagnosis of SARS-CoV-2 by FDA under an Emergency Use Authorization (EUA). This EUA will remain  in effect (meaning this test can be used) for the duration of the COVID-19 declaration under Section 564(b)(1) of the Act, 21 U.S.C.section 360bbb-3(b)(1), unless the authorization is terminated  or revoked sooner.       Influenza A by PCR NEGATIVE NEGATIVE   Influenza B by PCR NEGATIVE NEGATIVE    Comment: (NOTE) The Xpert Xpress SARS-CoV-2/FLU/RSV plus assay is intended as an aid in the diagnosis of influenza from Nasopharyngeal swab specimens  and should not be used as a sole basis for treatment. Nasal washings and aspirates are  unacceptable for Xpert Xpress SARS-CoV-2/FLU/RSV testing.  Fact Sheet for Patients: EntrepreneurPulse.com.au  Fact Sheet for Healthcare Providers: IncredibleEmployment.be  This test is not yet approved or cleared by the [place] FDA and has been authorized for detection and/or diagnosis of SARS-CoV-2 by FDA under an Emergency Use Authorization (EUA). This EUA will remain in effect (meaning this test can be used) for the duration of the COVID-19 declaration under Section 564(b)(1) of the Act, 21 U.S.C. section 360bbb-3(b)(1), unless the authorization is terminated or revoked.  Performed at [facility], [place] [address], [place], [place] [address]   Prepare RBC (crossmatch)     Status: None   Collection Time: 04/24/21  3:44 PM  Result Value Ref Range   Order Confirmation      ORDER PROCESSED BY BLOOD BANK Performed at [facility], [place] [address] [place], [place] [address]   POC occult blood, ED Provider will collect     Status: Abnormal   Collection Time: 04/24/21  4:15 PM  Result Value Ref Range   Fecal Occult Bld POSITIVE (A) NEGATIVE  CBC     Status: Abnormal   Collection Time: 04/24/21  7:51 PM  Result Value Ref Range   WBC 10.2 4.0 - 10.5 K/uL   RBC 2.30 (L) 3.87 - 5.11 MIL/uL   Hemoglobin 6.5 (LL) 12.0 - 15.0 g/dL    Comment: REPEATED TO VERIFY POST TRANSFUSION SPECIMEN THIS CRITICAL RESULT HAS VERIFIED AND BEEN CALLED TO Rick Duff, RN BY BILLEE WYLIE ON 09 30 2022 AT 2034, AND HAS BEEN READ BACK.     HCT 21.6 (L) 36.0 - 46.0 %   MCV 93.9 80.0 - 100.0 fL   MCH 28.3 26.0 - 34.0 pg   MCHC 30.1 30.0 - 36.0 g/dL   RDW 18.0 (H) 11.5 - 15.5 %   Platelets 231 150 - 400 K/uL   nRBC 0.0 0.0 - 0.2 %    Comment: Performed at [facility] [address] [place], [place] [address]  CBC with Differential/Platelet     Status: Abnormal   Collection Time: 04/25/21  2:05 AM  Result Value Ref Range   WBC 9.6 4.0 - 10.5  K/uL   RBC 2.84 (L) 3.87 - 5.11 MIL/uL   Hemoglobin 8.0 (L) 12.0 - 15.0 g/dL   HCT 26.1 (L) 36.0 - 46.0 %   MCV 91.9 80.0 - 100.0 fL   MCH 28.2 26.0 - 34.0 pg   MCHC 30.7 30.0 - 36.0 g/dL   RDW 17.6 (H) 11.5 - 15.5 %   Platelets 246 150 - 400 K/uL   nRBC 0.0 0.0 - 0.2 %   Neutrophils Relative % 87 %   Neutro Abs 8.3 (H) 1.7 - 7.7 K/uL   Lymphocytes Relative 6 %   Lymphs Abs 0.6 (L) 0.7 - 4.0 K/uL   Monocytes Relative 6 %   Monocytes Absolute 0.6 0.1 - 1.0 K/uL   Eosinophils Relative 0 %   Eosinophils Absolute 0.0 0.0 - 0.5 K/uL   Basophils Relative 0 %   Basophils Absolute 0.0 0.0 - 0.1 K/uL   Immature Granulocytes 1 %   Abs Immature Granulocytes 0.05 0.00 - 0.07 K/uL    Comment: Performed at [facility], [place] [address], [place], [place] [address]  Renal function panel     Status: Abnormal   Collection Time: 04/25/21  5:37 AM  Result Value Ref Range   Sodium 137 135 - 145 mmol/L   Potassium 5.3 (H) 3.5 - 5.1 mmol/L   Chloride 97 (L) 98 - 111 mmol/L   CO2 23 22 - 32 mmol/L   Glucose, Bld 136 (H) 70 - 99 mg/dL    Comment: Glucose reference range applies only to samples taken after fasting for at least 8 hours.   BUN 72 (H) 8 - 23 mg/dL   Creatinine, Ser 5.49 (H) 0.44 - 1.00 mg/dL   Calcium 9.3 8.9 - 10.3 mg/dL   Phosphorus 7.5 (H) 2.5 - 4.6 mg/dL   Albumin 2.7 (L) 3.5 - 5.0 g/dL   GFR, Estimated 7 (L) >60 mL/min    Comment: (NOTE) Calculated using the CKD-EPI Creatinine Equation (2021)    Anion gap 17 (H) 5 - 15    Comment: Performed at [facility] [address] [place], [place] [address]  CBG monitoring, ED     Status: Abnormal   Collection Time: 04/25/21  7:42 AM  Result Value Ref Range   Glucose-Capillary 133 (H) 70 - 99 mg/dL    Comment: Glucose reference range applies only to samples taken after fasting for at least 8 hours.     ROS: See HPI   Physical Exam: Vitals:   04/25/21 0930 04/25/21 1014  BP: (!) 174/96 (!) 156/74  Pulse: 88 87   Resp: (!) 21 18  Temp: 97.9 F (36.6 C) 98.2 F (36.8 C)  SpO2: 100% 98%     General: Alert elderly female NAD rebreather mask on HEENT: [place], PERRLA, EOMI, nonicteric Neck: No JVD Heart: Currently RRR on exam no MRG Lungs: Faint right basilar rales, nonlabored breathing O2 sat 100% rebreather mask Abdomen: Bowel sounds NABS, soft NTND Extremities: No pedal edema Skin: Warm dry no overt rash or pedal ulcers appreciated Neuro: Alert O x3 minimal left-sided lower extremity weakness Dialysis Access: Right IJ PermCath dressing dry clear  Dialysis Orders: Center: [facility], MWF EDW 65.5, 2K, 2 CA bath, UF 2 No heparin PermCath(noncommittal with previous appointments [facility]  access) Mircera 200 on 04/15/21 Calcitriol 0.5 MCG p.o. q. Dialysis  Noted TF S 10%, ferritin 106 =OP lab 04/08/21  Assessment/Plan Acute blood loss/GI bleed= admit Hgb 5.2 now 8.0 after transfusion, Eliquis on hold work-up per admit team no heparin dialysis Mild volume overload/pulm edema on chest x-ray= plan for HD UF today follow-up weights may have body weight loss and lower EDW needed ESRD -HD normal schedule MWF, HD today off schedule secondary to above no heparin HD, K5.3 Hypertension/volume  -BP stable volume elevated as above UF on dialysis follow-up weights may need lower dry weight Anemia of ESRD and #1-a.m. and iron deficiency =Hgb 8.0, noted Mircera 200 MCG given 9/21, ESA due next week we will give iron load with her outpatient transferrin sat 10% and ferritin 259 Metabolic bone disease -phosphorus 7.5 calcium corrected elevated we will hold vitamin D for now continue binder when taking meals History of atrial fib= currently appears to be sinus on exam holding Eliquis with GI bleed, also amiodarone and diltiazem held with episodes of bradycardia =plan per admit Nutrition -albumin 2.7, needs protein supplement when taking meals Type 2 diabetes mellitus per admit team  Ernest Haber, PA-C [place] [phone] 04/25/2021, 10:22 AM

## 2021-04-25 NOTE — H&P (View-Only) (Signed)
Referring Provider: Dr. Jimmye Wagner Primary Care Physician:  Karen Flow, MD Primary Gastroenterologist:  [name]. [name] in [place] is [name] is a 80 y.o. female with PMHx of ESRD (MWF), atrial fibrillation on Eliquis, gastritis/duodenitis complicated by duodenal ulcer, previous right basal ganglia hemorrhage who presented to the ED with low hemoglobin.  Hgb found to be 5.2 grams at dialysis on 9/30.  They transferred her to [facility] hospital, she did not receive dialysis.  Has been transfused 2 units PRBCs with Hgb up to 8.0 grams.  Is on pantoprazole 40 mg IV BID here and appears that she was taking it at home once daily.  She admits that her stools are sometimes darker brown in color, not sure about black per se but sometimes are sticky.  No red blood in her stools.  Says that she follows with Dr. Melina Wagner in [place] regularly for colonoscopy and says that she had one about 2 years ago.  EGD 06/2020 showed the following:  - Esophagogastric landmarks identified. - Z-line irregular, 40 cm from the incisors, did not meet criteria for Barrett's. - Gastritis. - Normal stomach otherwise - biopsies taken to rule out H pylori - Duodenitis with small clean based duodenal ulcer. Biopsied. Suspect duodenal ulcer / duodenitis / gastritis caused bleeding. Appears to be healing on PPI, no high risk lesions. If she needs anticoagulation in the near future would trial with heparin and see if she tolerates it. Otherwise continue twice daily protonix 40mg .  Past Medical History:  Diagnosis Date   Cancer ([place])    Chronic kidney disease    Diabetes mellitus    Hyperlipidemia    Hypertension    pt states at times    Past Surgical History:  Procedure Laterality Date   BIOPSY  07/17/2020   Procedure: BIOPSY;  Surgeon: Yetta Flock, MD;  Location: [facility] ENDOSCOPY;  Service: Gastroenterology;;   BREAST  LUMPECTOMY  03/26/2011   right   ESOPHAGOGASTRODUODENOSCOPY (EGD) WITH PROPOFOL N/A 07/17/2020   Procedure: ESOPHAGOGASTRODUODENOSCOPY (EGD) WITH PROPOFOL;  Surgeon: Yetta Flock, MD;  Location: [facility];  Service: Gastroenterology;  Laterality: N/A;    Prior to Admission medications   Medication Sig Start Date End Date Taking? Authorizing Provider  amiodarone (PACERONE) 200 MG tablet Take 1 tablet (200 mg total) by mouth daily. 07/22/20  Yes Thurnell Lose, MD  ascorbic acid (VITAMIN C) 500 MG tablet Take 500 mg by mouth See admin instructions. Take one tablet (500 mg) by mouth on Sunday, Tuesday, Thursday, Saturday mornings (non-dialysis days); take one tablet (500 mg) daily with supper   Yes [provider]  atorvastatin (LIPITOR) 20 MG tablet Take 20 mg by mouth at bedtime.   Yes [provider]  Cholecalciferol (VITAMIN D3) 25 MCG (1000 UT) CAPS Take 1,000 Units by mouth See admin instructions. Take one tablet (1000 mg) by mouth on Sunday, Tuesday, Thursday, Saturday mornings (non-dialysis days); take one tablet (1000 mg) daily with supper   Yes [provider]  diltiazem (CARDIZEM CD) 120 MG 24 hr capsule Take 1 capsule (120 mg total) by mouth daily. Patient taking differently: Take 120 mg by mouth every evening. 07/22/20  Yes Thurnell Lose, MD  ELIQUIS 2.5 MG TABS tablet TAKE 1 TABLET TWICE DAILY Patient taking differently: Take 2.5 mg by mouth 2 (two) times daily. 01/30/21  Yes [name], MD  Insulin Degludec-Liraglutide (XULTOPHY) 100-3.6  UNIT-MG/ML SOPN Inject 16 Units into the skin every morning.   Yes [provider]  linaclotide (LINZESS) 72 MCG capsule Take 72 mcg by mouth daily before supper.   Yes [provider]  pantoprazole (PROTONIX) 40 MG tablet Take 1 tablet (40 mg total) by mouth 2 (two) times daily. Patient taking differently: Take 40 mg by mouth See admin instructions. Take one tablet (40 mg) by mouth on  Sunday, Tuesday, Thursday, Saturday mornings (non-dialysis days); take one tablet (40 mg) by mouth daily with supper 07/22/20  Yes Thurnell Lose, MD  sevelamer carbonate (RENVELA) 800 MG tablet Take 800 mg by mouth See admin instructions. Take one tablet (800 mg) by mouth up to three times daily with meals 05/20/20  Yes [provider]  zinc sulfate 220 (50 Zn) MG capsule Take 220 mg by mouth See admin instructions. Take one tablet (220 mg) by mouth on Sunday, Tuesday, Thursday, Saturday mornings (non-dialysis days); take one tablet (220 mg) daily with supper   Yes [provider]  letrozole (FEMARA) 2.5 MG tablet Take 2.5 mg by mouth daily. Patient not taking: Reported on 04/24/2021    [provider]    Current Facility-Administered Medications  Medication Dose Route Frequency Provider Last Rate Last Admin   0.9 %  sodium chloride infusion  100 mL Intravenous PRN Elmarie Shiley, MD       0.9 %  sodium chloride infusion  100 mL Intravenous PRN Elmarie Shiley, MD       acetaminophen (TYLENOL) tablet 650 mg  650 mg Oral Q6H PRN Jose Persia, MD       Or   acetaminophen (TYLENOL) suppository 650 mg  650 mg Rectal Q6H PRN Jose Persia, MD       alteplase (CATHFLO ACTIVASE) injection 2 mg  2 mg Intracatheter Once PRN Elmarie Shiley, MD       atorvastatin (LIPITOR) tablet 20 mg  20 mg Oral QHS Jose Persia, MD       Chlorhexidine Gluconate Cloth 2 % PADS 6 each  6 each Topical Q0600 Elmarie Shiley, MD       heparin injection 1,000 Units  1,000 Units Dialysis PRN Elmarie Shiley, MD       heparin sodium (porcine) injection 3,800 Units  3,800 Units Intravenous Q dialysis Elmarie Shiley, MD       insulin aspart (novoLOG) injection 0-6 Units  0-6 Units Subcutaneous TID WC Jose Persia, MD       lidocaine (PF) (XYLOCAINE) 1 % injection 5 mL  5 mL Intradermal PRN Elmarie Shiley, MD       lidocaine-prilocaine (EMLA) cream 1 application  1 application Topical PRN Elmarie Shiley, MD        ondansetron [facility]) injection 4 mg  4 mg Intravenous Q8H PRN Jose Persia, MD       pantoprazole (PROTONIX) injection 40 mg  40 mg Intravenous Q12H Jose Persia, MD   40 mg at 04/25/21 7253   pentafluoroprop-tetrafluoroeth (GEBAUERS) aerosol 1 application  1 application Topical PRN Elmarie Shiley, MD       sodium chloride flush (NS) 0.9 % injection 3 mL  3 mL Intravenous Q12H Jose Persia, MD   3 mL at 04/25/21 6644    Allergies as of 04/24/2021   (No Known Allergies)    Family History  Problem Relation Age of Onset   Cancer Father        bronchial   Cancer Paternal Aunt        breast  Cancer Paternal Aunt        breast    Social History   Socioeconomic History   Marital status: Married    Spouse name: Not on file   Number of children: Not on file   Years of education: Not on file   Highest education level: Not on file  Occupational History   Not on file  Tobacco Use   Smoking status: Never   Smokeless tobacco: Never  Vaping Use   Vaping Use: Never used  Substance and Sexual Activity   Alcohol use: No   Drug use: No   Sexual activity: Not Currently  Other Topics Concern   Not on file  Social History Narrative   Not on file   Social Determinants of Health   Financial Resource Strain: Not on file  Food Insecurity: Not on file  Transportation Needs: Not on file  Physical Activity: Not on file  Stress: Not on file  Social Connections: Not on file  Intimate Partner Violence: Not on file    Review of Systems: ROS is O/W negative except as mentioned in HPI.  Physical Exam: Vital signs in last 24 hours: Temp:  [97.9 F (36.6 C)-99.4 F (37.4 C)] 98.5 F (36.9 C) (10/01 1206) Pulse Rate:  [49-102] 84 (10/01 1216) Resp:  [13-27] 19 (10/01 1216) BP: (99-180)/(54-117) 149/71 (10/01 1216) SpO2:  [81 %-100 %] 91 % (10/01 1216) FiO2 (%):  [100 %] 100 % (10/01 9983) Weight:  [68.8 kg] 68.8 kg (10/01 1206)   General:  Alert, elderly pleasant and  cooperative in NAD; receiving dialysis in her room Head:  Normocephalic and atraumatic. Eyes:  Sclera clear, no icterus.  Conjunctiva pink. Ears:  Normal auditory acuity. Mouth:  No deformity or lesions.   Lungs:  She has a non-rebreather mask on, but able to hold conversation well with that on and has non-labored breathing. Heart:  Regular rate and rhythm; no murmurs, clicks, rubs, or gallops. Abdomen:  Soft, non-tender, BS active,nonpalp mass or hsm.   Rectal:  Not performed but heme positive.  Msk:  Symmetrical without gross deformities.  Pulses:  Normal pulses noted. Extremities:  Without clubbing or edema. Neurologic:  Alert and oriented x 4;  grossly normal neurologically. Skin:  Intact without significant lesions or rashes. Psych:  Alert and cooperative. Normal mood and affect.  Intake/Output from previous day: 09/30 0701 - 10/01 0700 In: 1270 [I.V.:10; Blood:1260] Out: -   Lab Results: Recent Labs    04/24/21 1308 04/24/21 1951 04/25/21 0205  WBC 8.6 10.2 9.6  HGB 5.2* 6.5* 8.0*  HCT 18.3* 21.6* 26.1*  PLT 258 231 246   BMET Recent Labs    04/24/21 1308 04/25/21 0537  NA 139 137  K 5.0 5.3*  CL 97* 97*  CO2 23 23  GLUCOSE 174* 136*  BUN 62* 72*  CREATININE 4.84* 5.49*  CALCIUM 9.4 9.3   LFT Recent Labs    04/24/21 1308 04/25/21 0537  PROT 6.0*  --   ALBUMIN 2.9* 2.7*  AST 17  --   ALT 14  --   ALKPHOS 76  --   BILITOT 0.3  --    Hepatitis Panel Recent Labs    04/25/21 0516  HEPBSAG NON REACTIVE   Studies/Results: CT ANGIO HEAD NECK W WO CM  Result Date: 04/24/2021 CLINICAL DATA:  Focal neuro deficit, > 6 hrs, stroke suspected. EXAM: CT ANGIOGRAPHY HEAD AND NECK TECHNIQUE: Multidetector CT imaging of the head and neck was performed using  the standard protocol during bolus administration of intravenous contrast. Multiplanar CT image reconstructions and MIPs were obtained to evaluate the vascular anatomy. Carotid stenosis measurements (when  applicable) are obtained utilizing NASCET criteria, using the distal internal carotid diameter as the denominator. CONTRAST:  80mL OMNIPAQUE IOHEXOL 350 MG/ML SOLN COMPARISON:  11/07/2020 FINDINGS: CTA NECK FINDINGS Aortic arch: Standard 3 vessel aortic arch with a moderate amount of calcified plaque. No significant arch vessel origin stenosis. Right carotid system: Patent with predominantly calcified plaque at the carotid bifurcation and in the proximal ICA resulting in less than 50% stenosis. Left carotid system: Patent with predominantly calcified plaque at the carotid bifurcation resulting in less than 50% stenosis. Subtle beading of the mid to distal cervical ICA, similar to the prior CTA and which could reflect mild changes of fibromuscular dysplasia. Vertebral arteries: Patent and codominant with calcified plaque resulting in unchanged mild stenosis of the right vertebral origin. Focal moderate narrowing of the left vertebral artery at the C1-2 level which may be related to asymmetrically severe left lateral C1-2 arthropathy with prominent spurring as well as kinking of the vessel. Normal caliber of the left vertebral artery proximal and distal to this without a visible dissection flap. Skeleton: Advanced median and left lateral C1-2 arthropathy. Advanced multilevel cervical facet arthrosis. Other neck: No evidence of cervical lymphadenopathy or mass. Upper chest: Small bilateral pleural effusions. Partially visualized right jugular venous catheter. Review of the MIP images confirms the above findings CTA HEAD FINDINGS Anterior circulation: The internal carotid arteries are patent from skull base to carotid termini with similar appearance of moderate bilateral paraclinoid stenoses. ACAs and MCAs are patent without evidence of a proximal branch occlusion. Severe mid to distal right M1 and distal left M1/proximal M2 stenoses are similar to the prior CTA as well. No aneurysm is identified. Posterior circulation:  The intracranial vertebral arteries are patent to the basilar with atherosclerotic irregularity bilaterally but no high-grade stenosis. The basilar artery is widely patent. The PCAs are patent with similar appearance of severe bilateral P2 stenoses. No aneurysm is identified. Venous sinuses: As permitted by contrast timing, patent. Anatomic variants: None. Review of the MIP images confirms the above findings IMPRESSION: 1. No large vessel occlusion. 2. Similar appearance of advanced intracranial atherosclerosis with multiple moderate to severe anterior and posterior circulation stenoses as above. 3. Cervical carotid atherosclerosis without significant stenosis. 4. Mild right vertebral artery origin stenosis. 5.  Aortic Atherosclerosis (ICD10-I70.0). These results were communicated to Dr. Leonel Ramsay at 6:27 pm on 04/24/2021 by text page via the [facility] messaging system. Electronically Signed   By: Logan Bores M.D.   On: 04/24/2021 18:42   DG CHEST PORT 1 VIEW  Result Date: 04/25/2021 CLINICAL DATA:  Encounter for shortness of breath EXAM: PORTABLE CHEST 1 VIEW COMPARISON:  01/09/2021 FINDINGS: Cardiopericardial enlargement. Dialysis catheter on the right with tip at the upper cavoatrial junction. Diffuse interstitial and airspace opacity with [employer]. Left pleural effusion. The retrocardiac lung is either opacified or obscured. IMPRESSION: Pulmonary edema and left pleural effusion. Electronically Signed   By: Jorje Guild M.D.   On: 04/25/2021 06:32   CT HEAD CODE STROKE WO CONTRAST`  Result Date: 04/24/2021 CLINICAL DATA:  Code stroke.  Neuro deficit, acute, stroke suspected EXAM: CT HEAD WITHOUT CONTRAST TECHNIQUE: Contiguous axial images were obtained from the base of the skull through the vertex without intravenous contrast. COMPARISON:  CT Head 07/16/2020. FINDINGS: Brain: No evidence of acute large vascular territory infarction, hemorrhage, hydrocephalus, extra-axial collection  or mass  lesion/mass effect. Moderate patchy white matter hypoattenuation, nonspecific but compatible with chronic microvascular ischemic disease. Remote right cerebellar infarct. Vascular: No hyperdense vessel identified. Calcific intracranial atherosclerosis. Skull: No acute fracture. Sinuses/Orbits: Visualized sinuses are clear. No acute orbital abnormality. Other: Trace right mastoid effusion. ASPECTS [facility] Stroke Program Early CT Score) total score (0-10 with 10 being normal): 10. IMPRESSION: 1. No evidence of acute large vascular territory infarct or acute hemorrhage. ASPECTS is 10. 2. Moderate chronic microvascular ischemic disease and remote right cerebellar infarct. Code stroke imaging results were communicated on 04/24/2021 at 5:57 pm to provider Dr. Leonel Ramsay via telephone, who verbally acknowledged these results. Electronically Signed   By: Margaretha Sheffield M.D.   On: 04/24/2021 17:59    IMPRESSION:  *Acute on chronic normocytic anemia: Hemoglobin appears to run between 8.5 to 9.5 g at baseline.  She presented with a hemoglobin of 5.2 g.  Transfused with 2 units of packed red blood cells and hemoglobin up to 8.0 g.  No evidence of overt GI bleeding, but is heme positive.  She says that she gets colonoscopy regularly in [place], last about 2 years ago with Dr. Melina Wagner. *Gastritis/duodenitis/clean-based duodenal ulcer seen on EGD in 06/2020. *End-stage renal disease on HD MWF *History of atrial fibrillation on Eliquis at home *Acute neurological deficits: CT head and neck showed no evidence of acute infarct or hemorrhage.  Neurology was consulted. *Mild volume/fluid overload with pulmonary edema on chest x-ray:  Hopefully dialysis will help.  She is on a non-rebreather mask with non-labored breathing.  PLAN: -Agree with pantoprazole 40 mg IV twice daily. -Will plan for tentative EGD on 10/2 with Dr. Candis Wagner. -Monitor Hgb and transfuse further prn.  [name]  04/25/2021, 12:35 PM  I  have taken a history, reviewed the chart and examined the patient. I performed a substantive portion of this encounter, including complete performance of at least one of the key components, in conjunction with the APP. I agree with the APP's note, impression and recommendations   80 year old female with ESRD on HD MWF admitted with acute drop in Hgb without overt GI bleed, although she does report occasionally seeing dark stools.  She had a similar presentation in Dec 2021 (albeit with reportedly frank melena) and was found to have diffuse gastritis and duodenal ulceration, not requiring endoscopic therapy.  No AVMs were seen.   We will repeat EGD tomorrow to assess for PUD/AVMs.  I don't think a colonoscopy is necessary unless there are no possible bleeding etiologies found on EGD.   Acute on chronic anemia, suspected UGI source - EGD tomorrow - Protonix 40 mg BID - Potential colonoscopy if no findings on EGD   Karen Lykens E. Candis Schatz, MD [facility] Gastroenterology

## 2021-04-25 NOTE — Progress Notes (Signed)
Pt. Bp at start of tretment was 301 systolic has been trending downward as documented, currently 107/70. UF goal 3-4L currently attempting 3L. Dr. Posey Pronto made aware orders to give Albumin 12.5mg  x1. Keep SBP >100. Pt stable. Alert and oriented. No c/o distress

## 2021-04-25 NOTE — ED Notes (Signed)
After drawing morning labs, pt having increased work of breathing, and Spo2 dropping into the 70s on 6L via [place]. Pt transitioned to venturi mask and rate gradually increased to max without relief of symptoms and only minimal improvement in Spo2. Pt changed to NRB at 15L and improved to 93%. MD paged and notified of situation. New orders received and RT called to place pt on BiPAP. Will continue to monitor.

## 2021-04-25 NOTE — ED Notes (Signed)
Took BIPAP off briefly and placed on 6 L  for her to receive oral care and ice chips. SpO2 dropped to 79%.   Spoke with dialysis nurse. She is aware that pt is unable to maintain off BIPAP long enough to relieve dialysis. States that she will have to have dialysis in her room but doe not have a nurse to send down at this time.

## 2021-04-25 NOTE — ED Notes (Signed)
Resident paged regarding pt's increased Spo2 need. Pt states she feels full of fluid and can't breathe. O2 increased to 6L at this time. Pt repositioned in the bed and is in position of comfort. Pt asking for food and water, MD gave new order. No acute changes noted. Will continue to monitor.

## 2021-04-25 NOTE — ED Notes (Signed)
Neurologist at bedside. Pt resting on stretcher, respirations even and unlabored. Pt denies any complaints. Purewick in place, PIVs x2 in place, no redness or edema noted. Family at bedside. O2 increased to 4L due to Spo2 89% on 2L. Will continue to monitor. No acute changes noted.

## 2021-04-25 NOTE — Progress Notes (Signed)
Paged by RN for increasing work of breathing and O2 sats dropping to 70s on 6L [place]. Doing better on NRB at 15L but still having increased WOB. Assess patient at bedside. Slightly tachypneic with diffuse crackles bilaterally on exam. Respiratory Therapy at bedside, transitioning to BiPAP.   CXR showing worsening pulmonary edema. Last HD session was on Wednesday and she did complete full session. Giving one dose of IV lasix 80mg  as she still does produce urine.   Spoke with Dr. Posey Pronto and he will schedule patient for HD at an earlier time today to help optimize volume status. Greatly appreciate nephrology assistance.

## 2021-04-25 NOTE — Progress Notes (Signed)
HD 0 SUBJECTIVE:  Patient Summary: [name] is a 80 y.o. female (she/her) with a pertinent PMH of ESRD (MWF), atrial fibrillation on Eliquis, T2DM, HLD, HTN, Chronic GI bleeds, malignant lobular carcinoma of right breast status post right lumpectomy and radiation therapy, who presents to [facility] (04/24/2021) with Hb 5.2. [name]'s daughter ([name]) is at bedside to assist with history.   Overnight Events: She required 1 unit of packed red blood cell transfusion (for Hb 6.5), NS at 10 cc an hour, which resulted in respiratory distress. She desaturated on room air, requiring BiPAP. Also diuresed with IV Lasix 80 mg.  Patient was seen this AM, A&Ox4, with daughter at bedside. She is due for HD today. Reported that she is feeling much better than yesterday, however still drowsy. She denied chest pain, however does endorse abdominal pain.  Reported that this happens when she is due for HD.  Stated that the pain is mild, diffuse, and located across the lower abdomen.  Reported that her nephrologist stated that she accumulates fluid in her abdomen, rather than her LE.  She reported a spontaneous right foot drop that started 2 months ago.  OBJECTIVE:  Vital Signs: Vitals:   04/25/21 1500 04/25/21 1505 04/25/21 1515 04/25/21 1530  BP: 101/63  (!) 97/51 (!) 82/49  Pulse: 84 84 83 80  Resp: 16 18 17 18   Temp:      TempSrc:      SpO2: 100%  92% 93%  Weight:       Supplemental O2: NRB SpO2: 93 % O2 Flow Rate (L/min): 5 L/min FiO2 (%): 100 %  Filed Weights   04/25/21 1206  Weight: 151 lb 10.8 oz (68.8 kg)     Intake/Output Summary (Last 24 hours) at 04/25/2021 1536 Last data filed at 04/25/2021 0046 Gross per 24 hour  Intake 1270 ml  Output --  Net 1270 ml    Net IO Since Admission: 1,270 mL [04/25/21 1536]  Physical Exam: Physical Exam Vitals and nursing note reviewed.  Constitutional:      Comments: Drowsy  HENT:     Head: Normocephalic and  atraumatic.  Eyes:     Extraocular Movements: Extraocular movements intact.  Cardiovascular:     Rate and Rhythm: Normal rate and regular rhythm.     Pulses: Normal pulses.     Heart sounds: Normal heart sounds.  Pulmonary:     Breath sounds: Examination of the right-lower field reveals rales. Examination of the left-lower field reveals rales. Rales present.     Comments: Increased work of breathing on NRB 15L, but SPO2 stable. Abdominal:     General: Bowel sounds are normal. There is distension.     Tenderness: There is no abdominal tenderness. There is no guarding.  Skin:    General: Skin is warm and dry.  Neurological:     Mental Status: She is alert and oriented to person, place, and time. Mental status is at baseline.     Cranial Nerves: Cranial nerves are intact. No dysarthria or facial asymmetry.     Sensory: Sensation is intact.     Motor: No tremor.     Comments: 5/5 grade strength on UE, 5/5 great strength on left LE.  Right foot unable to flex (foot drop).    Pertinent Labs: CBC Latest Ref Rng & Units 04/25/2021 04/24/2021 04/24/2021  WBC 4.0 - 10.5 K/uL 9.6 10.2 8.6  Hemoglobin 12.0 - 15.0 g/dL 8.0(L) 6.5(LL) 5.2(LL)  Hematocrit 36.0 -  46.0 % 26.1(L) 21.6(L) 18.3(L)  Platelets 150 - 400 K/uL 246 231 258    CMP Latest Ref Rng & Units 04/25/2021 04/24/2021 01/11/2021  Glucose 70 - 99 mg/dL 136(H) 174(H) 164(H)  BUN 8 - 23 mg/dL 72(H) 62(H) 36(H)  Creatinine 0.44 - 1.00 mg/dL 5.49(H) 4.84(H) 4.55(H)  Sodium 135 - 145 mmol/L 137 139 134(L)  Potassium 3.5 - 5.1 mmol/L 5.3(H) 5.0 3.9  Chloride 98 - 111 mmol/L 97(L) 97(L) 93(L)  CO2 22 - 32 mmol/L 23 23 27   Calcium 8.9 - 10.3 mg/dL 9.3 9.4 9.7  Total Protein 6.5 - 8.1 g/dL - 6.0(L) -  Total Bilirubin 0.3 - 1.2 mg/dL - 0.3 -  Alkaline Phos 38 - 126 U/L - 76 -  AST 15 - 41 U/L - 17 -  ALT 0 - 44 U/L - 14 -    Recent Labs    04/25/21 0742 04/25/21 1150  GLUCAP 133* 156*     Pertinent Imaging: CT ANGIO HEAD NECK W WO  CM  Result Date: 04/24/2021 CLINICAL DATA:  Focal neuro deficit, > 6 hrs, stroke suspected. EXAM: CT ANGIOGRAPHY HEAD AND NECK TECHNIQUE: Multidetector CT imaging of the head and neck was performed using the standard protocol during bolus administration of intravenous contrast. Multiplanar CT image reconstructions and MIPs were obtained to evaluate the vascular anatomy. Carotid stenosis measurements (when applicable) are obtained utilizing NASCET criteria, using the distal internal carotid diameter as the denominator. CONTRAST:  74mL OMNIPAQUE IOHEXOL 350 MG/ML SOLN COMPARISON:  11/07/2020 FINDINGS: CTA NECK FINDINGS Aortic arch: Standard 3 vessel aortic arch with a moderate amount of calcified plaque. No significant arch vessel origin stenosis. Right carotid system: Patent with predominantly calcified plaque at the carotid bifurcation and in the proximal ICA resulting in less than 50% stenosis. Left carotid system: Patent with predominantly calcified plaque at the carotid bifurcation resulting in less than 50% stenosis. Subtle beading of the mid to distal cervical ICA, similar to the prior CTA and which could reflect mild changes of fibromuscular dysplasia. Vertebral arteries: Patent and codominant with calcified plaque resulting in unchanged mild stenosis of the right vertebral origin. Focal moderate narrowing of the left vertebral artery at the C1-2 level which may be related to asymmetrically severe left lateral C1-2 arthropathy with prominent spurring as well as kinking of the vessel. Normal caliber of the left vertebral artery proximal and distal to this without a visible dissection flap. Skeleton: Advanced median and left lateral C1-2 arthropathy. Advanced multilevel cervical facet arthrosis. Other neck: No evidence of cervical lymphadenopathy or mass. Upper chest: Small bilateral pleural effusions. Partially visualized right jugular venous catheter. Review of the MIP images confirms the above findings CTA  HEAD FINDINGS Anterior circulation: The internal carotid arteries are patent from skull base to carotid termini with similar appearance of moderate bilateral paraclinoid stenoses. ACAs and MCAs are patent without evidence of a proximal branch occlusion. Severe mid to distal right M1 and distal left M1/proximal M2 stenoses are similar to the prior CTA as well. No aneurysm is identified. Posterior circulation: The intracranial vertebral arteries are patent to the basilar with atherosclerotic irregularity bilaterally but no high-grade stenosis. The basilar artery is widely patent. The PCAs are patent with similar appearance of severe bilateral P2 stenoses. No aneurysm is identified. Venous sinuses: As permitted by contrast timing, patent. Anatomic variants: None. Review of the MIP images confirms the above findings IMPRESSION: 1. No large vessel occlusion. 2. Similar appearance of advanced intracranial atherosclerosis with multiple moderate to  severe anterior and posterior circulation stenoses as above. 3. Cervical carotid atherosclerosis without significant stenosis. 4. Mild right vertebral artery origin stenosis. 5.  Aortic Atherosclerosis (ICD10-I70.0). These results were communicated to Dr. Leonel Ramsay at 6:27 pm on 04/24/2021 by text page via the [facility] messaging system. Electronically Signed   By: Logan Bores M.D.   On: 04/24/2021 18:42   DG CHEST PORT 1 VIEW  Result Date: 04/25/2021 CLINICAL DATA:  Encounter for shortness of breath EXAM: PORTABLE CHEST 1 VIEW COMPARISON:  01/09/2021 FINDINGS: Cardiopericardial enlargement. Dialysis catheter on the right with tip at the upper cavoatrial junction. Diffuse interstitial and airspace opacity with [employer]. Left pleural effusion. The retrocardiac lung is either opacified or obscured. IMPRESSION: Pulmonary edema and left pleural effusion. Electronically Signed   By: Jorje Guild M.D.   On: 04/25/2021 06:32   CT HEAD CODE STROKE WO CONTRAST`  Result Date:  04/24/2021 CLINICAL DATA:  Code stroke.  Neuro deficit, acute, stroke suspected EXAM: CT HEAD WITHOUT CONTRAST TECHNIQUE: Contiguous axial images were obtained from the base of the skull through the vertex without intravenous contrast. COMPARISON:  CT Head 07/16/2020. FINDINGS: Brain: No evidence of acute large vascular territory infarction, hemorrhage, hydrocephalus, extra-axial collection or mass lesion/mass effect. Moderate patchy white matter hypoattenuation, nonspecific but compatible with chronic microvascular ischemic disease. Remote right cerebellar infarct. Vascular: No hyperdense vessel identified. Calcific intracranial atherosclerosis. Skull: No acute fracture. Sinuses/Orbits: Visualized sinuses are clear. No acute orbital abnormality. Other: Trace right mastoid effusion. ASPECTS [facility] Stroke Program Early CT Score) total score (0-10 with 10 being normal): 10. IMPRESSION: 1. No evidence of acute large vascular territory infarct or acute hemorrhage. ASPECTS is 10. 2. Moderate chronic microvascular ischemic disease and remote right cerebellar infarct. Code stroke imaging results were communicated on 04/24/2021 at 5:57 pm to provider Dr. Leonel Ramsay via telephone, who verbally acknowledged these results. Electronically Signed   By: Margaretha Sheffield M.D.   On: 04/24/2021 17:59    ASSESSMENT/PLAN:  Assessment: Active Problems:   Acute blood loss anemia  ARFA LAMARCA is a 80 y.o. female, with pertinent PMHx of ESRD (MWF), atrial fibrillation on Eliquis, gastritis/duodenitis complicated by duodenal ulcer, previous right basal ganglia hemorrhage who presents to the ED with low hemoglobin and admitted for acute blood loss anemia.  HD 0  Plan: #TACO/Pulmonary Edema  2/2 combination of hypervolemic state for missing HD yesterday and overnight 1u pRBC transfusion for hemoglobin of 6.5.  Requiring NRB 15L O2 overnight.  She was also diuresed with IV Lasix 80 mg, to relieve pulmonary edema while waiting  for HD. At baseline patient's saturates well on room air.  Suspect that after HD, patient dyspnea will be of much improved, and we will consider starting IV Lasix for residual fluid.  We will continue to monitor for any symptoms of respiratory distress or change in O2 requirements -HD today, per nephrology -Continue BiPAP and de-escalate as tolerated (SpO2 > 90)  #Acute blood loss anemia  Hb 5.2 on admission. 2/2 suspected upper GI bleed because of prior history of upper GI bleed due to gastritis/duodenitis with duodenal ulcer in December 2021; remains on PPI every other day. Of note, last colonoscopy in chart was in 2013, one polyp removed. On admission fecal occult was positive.  Has received pRBCs x2 total this hospitalization. Most recent Hb 8.0. Patient will need EGD to confirm source of bleeding. Anemia of ESRD, may also play a role. Will also call patient's nephrologist to establish her baseline Hb. GI has  been consulted, appreciate assistance and recommendations. -EGD per GI tomorrow -Continue Protonix 40 mg IV BID -Continue to hold home Linzess  -Trend CBC -Transfuse PRN for hemoglobin < 7  #Acute Neurological Deficits  Neuro deficit described from admission, has resolved.  She has 5 out of 5 grade strength on physical exam as seen above.  Most likely 2/2 hypo-perfusion event in the setting of profound anemia leading to ischemia in the area of high grade stenosis noted in the right and left MCA, given no evidence of CVA on CTA head and neck.  We will consider MRI brain once stabilized.  We will continue to monitor for any changes neuro symptoms. Neurology consulted; greatly appreciate their recommendations.    # ESRD (MWF) Secondary to diabetic nephrosclerosis. Follows with [facility]  Due to have HD today. Nephrology consulted, appreciate recommendations and assistance. -HD per nephrology -Trend CMP -Trend BMP  # Sinus Bradycardia # Hx of Atrial Fibrillation  Currently on  Diltiazem and Amiodarone. Anticoagulated with Eliquis. HAS-BLED score of 4.  -Continue to hold Amiodarone and Diltiazem in light of bradycardia, as it may be contributing to lethargy -Continue to hold Eliquis in light of acute anemia    # Type 2 Diabetes Mellitus  On Xultophy at home.  -SSI (sensitive) -Trend CBG  Best Practice: Diet: NPO IVF: None,None VTE: None Code: Full Code  PT/OT recs: Pending, none. TOC recs: Pending  Prior to Admission Living Arrangement: Home  Anticipated Discharge Location: SNF  Barriers to Discharge: GI bleed and symptomatic anemia  Dispo: Anticipated discharge in 1-2 days.  Signature: Merrily Brittle, DO Psychiatry Resident, PGY-1 [facility] Teaching Service Pager: [phone] 3:36 PM, 04/25/2021  Please contact the on call pager after 5 pm and on weekends at [phone].

## 2021-04-25 NOTE — Consult Note (Addendum)
Referring Provider: Dr. Jimmye Norman Primary Care Physician:  [name], MD Primary Gastroenterologist:  Netty Starring. Melina Copa in [place] is [name] is a 80 y.o. female with PMHx of ESRD (MWF), atrial fibrillation on Eliquis, gastritis/duodenitis complicated by duodenal ulcer, previous right basal ganglia hemorrhage who presented to the ED with low hemoglobin.  Hgb found to be 5.2 grams at dialysis on 9/30.  They transferred her to [facility] hospital, she did not receive dialysis.  Has been transfused 2 units PRBCs with Hgb up to 8.0 grams.  Is on pantoprazole 40 mg IV BID here and appears that she was taking it at home once daily.  She admits that her stools are sometimes darker brown in color, not sure about black per se but sometimes are sticky.  No red blood in her stools.  Says that she follows with Dr. Melina Copa in [place] regularly for colonoscopy and says that she had one about 2 years ago.  EGD 06/2020 showed the following:  - Esophagogastric landmarks identified. - Z-line irregular, 40 cm from the incisors, did not meet criteria for Barrett's. - Gastritis. - Normal stomach otherwise - biopsies taken to rule out H pylori - Duodenitis with small clean based duodenal ulcer. Biopsied. Suspect duodenal ulcer / duodenitis / gastritis caused bleeding. Appears to be healing on PPI, no high risk lesions. If she needs anticoagulation in the near future would trial with heparin and see if she tolerates it. Otherwise continue twice daily protonix 40mg .  Past Medical History:  Diagnosis Date   Cancer ([place])    Chronic kidney disease    Diabetes mellitus    Hyperlipidemia    Hypertension    pt states at times    Past Surgical History:  Procedure Laterality Date   BIOPSY  07/17/2020   Procedure: BIOPSY;  Surgeon: Yetta Flock, MD;  Location: [facility] ENDOSCOPY;  Service: Gastroenterology;;   BREAST  LUMPECTOMY  03/26/2011   right   ESOPHAGOGASTRODUODENOSCOPY (EGD) WITH PROPOFOL N/A 07/17/2020   Procedure: ESOPHAGOGASTRODUODENOSCOPY (EGD) WITH PROPOFOL;  Surgeon: Yetta Flock, MD;  Location: [facility];  Service: Gastroenterology;  Laterality: N/A;    Prior to Admission medications   Medication Sig Start Date End Date Taking? Authorizing Provider  amiodarone (PACERONE) 200 MG tablet Take 1 tablet (200 mg total) by mouth daily. 07/22/20  Yes Thurnell Lose, MD  ascorbic acid (VITAMIN C) 500 MG tablet Take 500 mg by mouth See admin instructions. Take one tablet (500 mg) by mouth on Sunday, Tuesday, Thursday, Saturday mornings (non-dialysis days); take one tablet (500 mg) daily with supper   Yes [provider]  atorvastatin (LIPITOR) 20 MG tablet Take 20 mg by mouth at bedtime.   Yes [provider]  Cholecalciferol (VITAMIN D3) 25 MCG (1000 UT) CAPS Take 1,000 Units by mouth See admin instructions. Take one tablet (1000 mg) by mouth on Sunday, Tuesday, Thursday, Saturday mornings (non-dialysis days); take one tablet (1000 mg) daily with supper   Yes [provider]  diltiazem (CARDIZEM CD) 120 MG 24 hr capsule Take 1 capsule (120 mg total) by mouth daily. Patient taking differently: Take 120 mg by mouth every evening. 07/22/20  Yes Thurnell Lose, MD  ELIQUIS 2.5 MG TABS tablet TAKE 1 TABLET TWICE DAILY Patient taking differently: Take 2.5 mg by mouth 2 (two) times daily. 01/30/21  Yes [name], MD  Insulin Degludec-Liraglutide (XULTOPHY) 100-3.6  UNIT-MG/ML SOPN Inject 16 Units into the skin every morning.   Yes [provider]  linaclotide (LINZESS) 72 MCG capsule Take 72 mcg by mouth daily before supper.   Yes [provider]  pantoprazole (PROTONIX) 40 MG tablet Take 1 tablet (40 mg total) by mouth 2 (two) times daily. Patient taking differently: Take 40 mg by mouth See admin instructions. Take one tablet (40 mg) by mouth on  Sunday, Tuesday, Thursday, Saturday mornings (non-dialysis days); take one tablet (40 mg) by mouth daily with supper 07/22/20  Yes Thurnell Lose, MD  sevelamer carbonate (RENVELA) 800 MG tablet Take 800 mg by mouth See admin instructions. Take one tablet (800 mg) by mouth up to three times daily with meals 05/20/20  Yes [provider]  zinc sulfate 220 (50 Zn) MG capsule Take 220 mg by mouth See admin instructions. Take one tablet (220 mg) by mouth on Sunday, Tuesday, Thursday, Saturday mornings (non-dialysis days); take one tablet (220 mg) daily with supper   Yes [provider]  letrozole (FEMARA) 2.5 MG tablet Take 2.5 mg by mouth daily. Patient not taking: Reported on 04/24/2021    [provider]    Current Facility-Administered Medications  Medication Dose Route Frequency Provider Last Rate Last Admin   0.9 %  sodium chloride infusion  100 mL Intravenous PRN Elmarie Shiley, MD       0.9 %  sodium chloride infusion  100 mL Intravenous PRN Elmarie Shiley, MD       acetaminophen (TYLENOL) tablet 650 mg  650 mg Oral Q6H PRN Jose Persia, MD       Or   acetaminophen (TYLENOL) suppository 650 mg  650 mg Rectal Q6H PRN Jose Persia, MD       alteplase (CATHFLO ACTIVASE) injection 2 mg  2 mg Intracatheter Once PRN Elmarie Shiley, MD       atorvastatin (LIPITOR) tablet 20 mg  20 mg Oral QHS Jose Persia, MD       Chlorhexidine Gluconate Cloth 2 % PADS 6 each  6 each Topical Q0600 Elmarie Shiley, MD       heparin injection 1,000 Units  1,000 Units Dialysis PRN Elmarie Shiley, MD       heparin sodium (porcine) injection 3,800 Units  3,800 Units Intravenous Q dialysis Elmarie Shiley, MD       insulin aspart (novoLOG) injection 0-6 Units  0-6 Units Subcutaneous TID WC Jose Persia, MD       lidocaine (PF) (XYLOCAINE) 1 % injection 5 mL  5 mL Intradermal PRN Elmarie Shiley, MD       lidocaine-prilocaine (EMLA) cream 1 application  1 application Topical PRN Elmarie Shiley, MD        ondansetron [facility]) injection 4 mg  4 mg Intravenous Q8H PRN Jose Persia, MD       pantoprazole (PROTONIX) injection 40 mg  40 mg Intravenous Q12H Jose Persia, MD   40 mg at 04/25/21 7425   pentafluoroprop-tetrafluoroeth (GEBAUERS) aerosol 1 application  1 application Topical PRN Elmarie Shiley, MD       sodium chloride flush (NS) 0.9 % injection 3 mL  3 mL Intravenous Q12H Jose Persia, MD   3 mL at 04/25/21 9563    Allergies as of 04/24/2021   (No Known Allergies)    Family History  Problem Relation Age of Onset   Cancer Father        bronchial   Cancer Paternal Aunt        breast  Cancer Paternal Aunt        breast    Social History   Socioeconomic History   Marital status: Married    Spouse name: Not on file   Number of children: Not on file   Years of education: Not on file   Highest education level: Not on file  Occupational History   Not on file  Tobacco Use   Smoking status: Never   Smokeless tobacco: Never  Vaping Use   Vaping Use: Never used  Substance and Sexual Activity   Alcohol use: No   Drug use: No   Sexual activity: Not Currently  Other Topics Concern   Not on file  Social History Narrative   Not on file   Social Determinants of Health   Financial Resource Strain: Not on file  Food Insecurity: Not on file  Transportation Needs: Not on file  Physical Activity: Not on file  Stress: Not on file  Social Connections: Not on file  Intimate Partner Violence: Not on file    Review of Systems: ROS is O/W negative except as mentioned in HPI.  Physical Exam: Vital signs in last 24 hours: Temp:  [97.9 F (36.6 C)-99.4 F (37.4 C)] 98.5 F (36.9 C) (10/01 1206) Pulse Rate:  [49-102] 84 (10/01 1216) Resp:  [13-27] 19 (10/01 1216) BP: (99-180)/(54-117) 149/71 (10/01 1216) SpO2:  [81 %-100 %] 91 % (10/01 1216) FiO2 (%):  [100 %] 100 % (10/01 6789) Weight:  [68.8 kg] 68.8 kg (10/01 1206)   General:  Alert, elderly pleasant and  cooperative in NAD; receiving dialysis in her room Head:  Normocephalic and atraumatic. Eyes:  Sclera clear, no icterus.  Conjunctiva pink. Ears:  Normal auditory acuity. Mouth:  No deformity or lesions.   Lungs:  She has a non-rebreather mask on, but able to hold conversation well with that on and has non-labored breathing. Heart:  Regular rate and rhythm; no murmurs, clicks, rubs, or gallops. Abdomen:  Soft, non-tender, BS active,nonpalp mass or hsm.   Rectal:  Not performed but heme positive.  Msk:  Symmetrical without gross deformities.  Pulses:  Normal pulses noted. Extremities:  Without clubbing or edema. Neurologic:  Alert and oriented x 4;  grossly normal neurologically. Skin:  Intact without significant lesions or rashes. Psych:  Alert and cooperative. Normal mood and affect.  Intake/Output from previous day: 09/30 0701 - 10/01 0700 In: 1270 [I.V.:10; Blood:1260] Out: -   Lab Results: Recent Labs    04/24/21 1308 04/24/21 1951 04/25/21 0205  WBC 8.6 10.2 9.6  HGB 5.2* 6.5* 8.0*  HCT 18.3* 21.6* 26.1*  PLT 258 231 246   BMET Recent Labs    04/24/21 1308 04/25/21 0537  NA 139 137  K 5.0 5.3*  CL 97* 97*  CO2 23 23  GLUCOSE 174* 136*  BUN 62* 72*  CREATININE 4.84* 5.49*  CALCIUM 9.4 9.3   LFT Recent Labs    04/24/21 1308 04/25/21 0537  PROT 6.0*  --   ALBUMIN 2.9* 2.7*  AST 17  --   ALT 14  --   ALKPHOS 76  --   BILITOT 0.3  --    Hepatitis Panel Recent Labs    04/25/21 0516  HEPBSAG NON REACTIVE   Studies/Results: CT ANGIO HEAD NECK W WO CM  Result Date: 04/24/2021 CLINICAL DATA:  Focal neuro deficit, > 6 hrs, stroke suspected. EXAM: CT ANGIOGRAPHY HEAD AND NECK TECHNIQUE: Multidetector CT imaging of the head and neck was performed using  the standard protocol during bolus administration of intravenous contrast. Multiplanar CT image reconstructions and MIPs were obtained to evaluate the vascular anatomy. Carotid stenosis measurements (when  applicable) are obtained utilizing NASCET criteria, using the distal internal carotid diameter as the denominator. CONTRAST:  72mL OMNIPAQUE IOHEXOL 350 MG/ML SOLN COMPARISON:  11/07/2020 FINDINGS: CTA NECK FINDINGS Aortic arch: Standard 3 vessel aortic arch with a moderate amount of calcified plaque. No significant arch vessel origin stenosis. Right carotid system: Patent with predominantly calcified plaque at the carotid bifurcation and in the proximal ICA resulting in less than 50% stenosis. Left carotid system: Patent with predominantly calcified plaque at the carotid bifurcation resulting in less than 50% stenosis. Subtle beading of the mid to distal cervical ICA, similar to the prior CTA and which could reflect mild changes of fibromuscular dysplasia. Vertebral arteries: Patent and codominant with calcified plaque resulting in unchanged mild stenosis of the right vertebral origin. Focal moderate narrowing of the left vertebral artery at the C1-2 level which may be related to asymmetrically severe left lateral C1-2 arthropathy with prominent spurring as well as kinking of the vessel. Normal caliber of the left vertebral artery proximal and distal to this without a visible dissection flap. Skeleton: Advanced median and left lateral C1-2 arthropathy. Advanced multilevel cervical facet arthrosis. Other neck: No evidence of cervical lymphadenopathy or mass. Upper chest: Small bilateral pleural effusions. Partially visualized right jugular venous catheter. Review of the MIP images confirms the above findings CTA HEAD FINDINGS Anterior circulation: The internal carotid arteries are patent from skull base to carotid termini with similar appearance of moderate bilateral paraclinoid stenoses. ACAs and MCAs are patent without evidence of a proximal branch occlusion. Severe mid to distal right M1 and distal left M1/proximal M2 stenoses are similar to the prior CTA as well. No aneurysm is identified. Posterior circulation:  The intracranial vertebral arteries are patent to the basilar with atherosclerotic irregularity bilaterally but no high-grade stenosis. The basilar artery is widely patent. The PCAs are patent with similar appearance of severe bilateral P2 stenoses. No aneurysm is identified. Venous sinuses: As permitted by contrast timing, patent. Anatomic variants: None. Review of the MIP images confirms the above findings IMPRESSION: 1. No large vessel occlusion. 2. Similar appearance of advanced intracranial atherosclerosis with multiple moderate to severe anterior and posterior circulation stenoses as above. 3. Cervical carotid atherosclerosis without significant stenosis. 4. Mild right vertebral artery origin stenosis. 5.  Aortic Atherosclerosis (ICD10-I70.0). These results were communicated to Dr. Leonel Ramsay at 6:27 pm on 04/24/2021 by text page via the [facility] messaging system. Electronically Signed   By: Logan Bores M.D.   On: 04/24/2021 18:42   DG CHEST PORT 1 VIEW  Result Date: 04/25/2021 CLINICAL DATA:  Encounter for shortness of breath EXAM: PORTABLE CHEST 1 VIEW COMPARISON:  01/09/2021 FINDINGS: Cardiopericardial enlargement. Dialysis catheter on the right with tip at the upper cavoatrial junction. Diffuse interstitial and airspace opacity with [employer]. Left pleural effusion. The retrocardiac lung is either opacified or obscured. IMPRESSION: Pulmonary edema and left pleural effusion. Electronically Signed   By: Jorje Guild M.D.   On: 04/25/2021 06:32   CT HEAD CODE STROKE WO CONTRAST`  Result Date: 04/24/2021 CLINICAL DATA:  Code stroke.  Neuro deficit, acute, stroke suspected EXAM: CT HEAD WITHOUT CONTRAST TECHNIQUE: Contiguous axial images were obtained from the base of the skull through the vertex without intravenous contrast. COMPARISON:  CT Head 07/16/2020. FINDINGS: Brain: No evidence of acute large vascular territory infarction, hemorrhage, hydrocephalus, extra-axial collection  or mass  lesion/mass effect. Moderate patchy white matter hypoattenuation, nonspecific but compatible with chronic microvascular ischemic disease. Remote right cerebellar infarct. Vascular: No hyperdense vessel identified. Calcific intracranial atherosclerosis. Skull: No acute fracture. Sinuses/Orbits: Visualized sinuses are clear. No acute orbital abnormality. Other: Trace right mastoid effusion. ASPECTS [facility] Stroke Program Early CT Score) total score (0-10 with 10 being normal): 10. IMPRESSION: 1. No evidence of acute large vascular territory infarct or acute hemorrhage. ASPECTS is 10. 2. Moderate chronic microvascular ischemic disease and remote right cerebellar infarct. Code stroke imaging results were communicated on 04/24/2021 at 5:57 pm to provider Dr. Leonel Ramsay via telephone, who verbally acknowledged these results. Electronically Signed   By: Margaretha Sheffield M.D.   On: 04/24/2021 17:59    IMPRESSION:  *Acute on chronic normocytic anemia: Hemoglobin appears to run between 8.5 to 9.5 g at baseline.  She presented with a hemoglobin of 5.2 g.  Transfused with 2 units of packed red blood cells and hemoglobin up to 8.0 g.  No evidence of overt GI bleeding, but is heme positive.  She says that she gets colonoscopy regularly in [place], last about 2 years ago with Dr. Melina Copa. *Gastritis/duodenitis/clean-based duodenal ulcer seen on EGD in 06/2020. *End-stage renal disease on HD MWF *History of atrial fibrillation on Eliquis at home *Acute neurological deficits: CT head and neck showed no evidence of acute infarct or hemorrhage.  Neurology was consulted. *Mild volume/fluid overload with pulmonary edema on chest x-ray:  Hopefully dialysis will help.  She is on a non-rebreather mask with non-labored breathing.  PLAN: -Agree with pantoprazole 40 mg IV twice daily. -Will plan for tentative EGD on 10/2 with Dr. Candis Schatz. -Monitor Hgb and transfuse further prn.  Laban Emperor. Zehr  04/25/2021, 12:35 PM  I  have taken a history, reviewed the chart and examined the patient. I performed a substantive portion of this encounter, including complete performance of at least one of the key components, in conjunction with the APP. I agree with the APP's note, impression and recommendations   80 year old female with ESRD on HD MWF admitted with acute drop in Hgb without overt GI bleed, although she does report occasionally seeing dark stools.  She had a similar presentation in Dec 2021 (albeit with reportedly frank melena) and was found to have diffuse gastritis and duodenal ulceration, not requiring endoscopic therapy.  No AVMs were seen.   We will repeat EGD tomorrow to assess for PUD/AVMs.  I don't think a colonoscopy is necessary unless there are no possible bleeding etiologies found on EGD.   Acute on chronic anemia, suspected UGI source - EGD tomorrow - Protonix 40 mg BID - Potential colonoscopy if no findings on EGD   Najae Filsaime E. Candis Schatz, MD [facility] Gastroenterology

## 2021-04-25 NOTE — ED Notes (Signed)
Pt given ice chips and diet ginger ale per request. No acute changes noted. Will continue to monitor.

## 2021-04-25 NOTE — Progress Notes (Signed)
RT in to assess pt. Pt. o2Sats has been 100% on NRB. RT changed pt. To 5L [place].

## 2021-04-25 NOTE — Progress Notes (Signed)
Pt refused BiPAP tonight. Will continue to monitor.

## 2021-04-26 ENCOUNTER — Encounter (HOSPITAL_COMMUNITY): Payer: Self-pay | Admitting: Internal Medicine

## 2021-04-26 ENCOUNTER — Encounter (HOSPITAL_COMMUNITY)
Admission: EM | Disposition: A | Discharge status: Home Health | Payer: Self-pay | Source: Home / Self Care | Attending: Internal Medicine

## 2021-04-26 ENCOUNTER — Inpatient Hospital Stay (HOSPITAL_COMMUNITY): Payer: Medicare HMO | Admitting: Certified Registered Nurse Anesthetist

## 2021-04-26 DIAGNOSIS — K31819 Angiodysplasia of stomach and duodenum without bleeding: Secondary | ICD-10-CM

## 2021-04-26 HISTORY — PX: HOT HEMOSTASIS: SHX5433

## 2021-04-26 HISTORY — PX: BIOPSY: SHX5522

## 2021-04-26 HISTORY — PX: ESOPHAGOGASTRODUODENOSCOPY (EGD) WITH PROPOFOL: SHX5813

## 2021-04-26 LAB — CBC WITH DIFFERENTIAL/PLATELET
Abs Immature Granulocytes: 0.07 10*3/uL (ref 0.00–0.07)
Basophils Absolute: 0 10*3/uL (ref 0.0–0.1)
Basophils Relative: 0 %
Eosinophils Absolute: 0 10*3/uL (ref 0.0–0.5)
Eosinophils Relative: 0 %
HCT: 22.4 % — ABNORMAL LOW (ref 36.0–46.0)
Hemoglobin: 6.9 g/dL — CL (ref 12.0–15.0)
Immature Granulocytes: 1 %
Lymphocytes Relative: 4 %
Lymphs Abs: 0.5 10*3/uL — ABNORMAL LOW (ref 0.7–4.0)
MCH: 27.9 pg (ref 26.0–34.0)
MCHC: 30.8 g/dL (ref 30.0–36.0)
MCV: 90.7 fL (ref 80.0–100.0)
Monocytes Absolute: 0.8 10*3/uL (ref 0.1–1.0)
Monocytes Relative: 6 %
Neutro Abs: 11.3 10*3/uL — ABNORMAL HIGH (ref 1.7–7.7)
Neutrophils Relative %: 89 %
Platelets: 228 10*3/uL (ref 150–400)
RBC: 2.47 MIL/uL — ABNORMAL LOW (ref 3.87–5.11)
RDW: 17.4 % — ABNORMAL HIGH (ref 11.5–15.5)
WBC: 12.7 10*3/uL — ABNORMAL HIGH (ref 4.0–10.5)
nRBC: 0 % (ref 0.0–0.2)

## 2021-04-26 LAB — GLUCOSE, CAPILLARY
Glucose-Capillary: 137 mg/dL — ABNORMAL HIGH (ref 70–99)
Glucose-Capillary: 123 mg/dL — ABNORMAL HIGH (ref 70–99)
Glucose-Capillary: 139 mg/dL — ABNORMAL HIGH (ref 70–99)
Glucose-Capillary: 179 mg/dL — ABNORMAL HIGH (ref 70–99)
Glucose-Capillary: 176 mg/dL — ABNORMAL HIGH (ref 70–99)

## 2021-04-26 LAB — RENAL FUNCTION PANEL
Albumin: 2.3 g/dL — ABNORMAL LOW (ref 3.5–5.0)
Anion gap: 10 (ref 5–15)
BUN: 34 mg/dL — ABNORMAL HIGH (ref 8–23)
CO2: 27 mmol/L (ref 22–32)
Calcium: 8.8 mg/dL — ABNORMAL LOW (ref 8.9–10.3)
Chloride: 99 mmol/L (ref 98–111)
Creatinine, Ser: 3.57 mg/dL — ABNORMAL HIGH (ref 0.44–1.00)
GFR, Estimated: 12 mL/min — ABNORMAL LOW (ref >60)
Glucose, Bld: 122 mg/dL — ABNORMAL HIGH (ref 70–99)
Phosphorus: 5.3 mg/dL — ABNORMAL HIGH (ref 2.5–4.6)
Potassium: 4.7 mmol/L (ref 3.5–5.1)
Sodium: 136 mmol/L (ref 135–145)

## 2021-04-26 LAB — HEMOGLOBIN AND HEMATOCRIT, BLOOD
HCT: 26.8 % — ABNORMAL LOW (ref 36.0–46.0)
Hemoglobin: 8.5 g/dL — ABNORMAL LOW (ref 12.0–15.0)

## 2021-04-26 LAB — PREPARE RBC (CROSSMATCH)

## 2021-04-26 LAB — HEPATITIS B SURFACE ANTIBODY, QUANTITATIVE: Hep B S AB Quant (Post): 60.1 m[IU]/mL (ref >9.9)

## 2021-04-26 SURGERY — ESOPHAGOGASTRODUODENOSCOPY (EGD) WITH PROPOFOL
Anesthesia: Monitor Anesthesia Care

## 2021-04-26 MED ORDER — LIDOCAINE 2% (20 MG/ML) 5 ML SYRINGE
INTRAMUSCULAR | Status: DC | PRN
  Administered 2021-04-26: 40 mg via INTRAVENOUS

## 2021-04-26 MED ORDER — LINACLOTIDE 72 MCG PO CAPS
72.0000 ug | ORAL_CAPSULE | Freq: Every day | ORAL | Status: DC
  Administered 2021-04-26 – 2021-04-28 (×3): 72 ug via ORAL
  Filled 2021-04-26 (×4): qty 1

## 2021-04-26 MED ORDER — SODIUM CHLORIDE 0.9 % IV SOLN: INTRAVENOUS | Status: DC | PRN

## 2021-04-26 MED ORDER — PROPOFOL 500 MG/50ML IV EMUL
INTRAVENOUS | Status: DC | PRN
  Administered 2021-04-26: 125 ug/kg/min via INTRAVENOUS

## 2021-04-26 MED ORDER — SODIUM CHLORIDE 0.9% IV SOLUTION: Freq: Once | INTRAVENOUS | Status: AC

## 2021-04-26 MED ORDER — SUCRALFATE 1 GM/10ML PO SUSP
1.0000 g | Freq: Three times a day (TID) | ORAL | Status: DC
  Administered 2021-04-26 – 2021-04-28 (×11): 1 g via ORAL
  Filled 2021-04-26 (×11): qty 10

## 2021-04-26 MED ORDER — PROPOFOL 10 MG/ML IV BOLUS
INTRAVENOUS | Status: DC | PRN
  Administered 2021-04-26: 20 mg via INTRAVENOUS
  Administered 2021-04-26 (×4): 10 mg via INTRAVENOUS

## 2021-04-26 MED ORDER — PANTOPRAZOLE SODIUM 40 MG PO TBEC
40.0000 mg | DELAYED_RELEASE_TABLET | Freq: Two times a day (BID) | ORAL | Status: DC
  Administered 2021-04-26 – 2021-04-28 (×5): 40 mg via ORAL
  Filled 2021-04-26 (×5): qty 1

## 2021-04-26 MED ORDER — SODIUM CHLORIDE 0.9 % IV SOLN: INTRAVENOUS | Status: DC

## 2021-04-26 NOTE — Interval H&P Note (Signed)
History and Physical Interval Note: No acute events overnight.  Patient tolerated dialysis.  Hemoglobin dropped again overnight after her transfusion, still no overt GI bleeding.  04/26/2021 9:24 AM  [name]  has presented today for surgery, with the diagnosis of Anemia and positive blood in stool.  The various methods of treatment have been discussed with the patient and family. After consideration of risks, benefits and other options for treatment, the patient has consented to  Procedure(s): ESOPHAGOGASTRODUODENOSCOPY (EGD) WITH PROPOFOL (N/A) as a surgical intervention.  The patient's history has been reviewed, patient examined, no change in status, stable for surgery.  I have reviewed the patient's chart and labs.  Questions were answered to the patient's satisfaction.     Daryel November

## 2021-04-26 NOTE — Progress Notes (Signed)
Pt refused BiPAP tonight. 

## 2021-04-26 NOTE — Anesthesia Preprocedure Evaluation (Signed)
Anesthesia Evaluation  Patient identified by MRN, date of birth, ID band Patient awake    Reviewed: Allergy & Precautions, NPO status , Patient's Chart, lab work & pertinent test results, Unable to perform ROS - Chart review only  History of Anesthesia Complications Negative for: history of anesthetic complications  Airway Mallampati: II  TM Distance: >3 FB Neck ROM: Full    Dental no notable dental hx. (+) Dental Advisory Given   Pulmonary pneumonia,  Covid +   Pulmonary exam normal        Cardiovascular hypertension, Pt. on home beta blockers and Pt. on medications Normal cardiovascular exam+ Valvular Problems/Murmurs MR   Echo 07/14/2020 1. Left ventricular ejection fraction, by estimation, is 60 to 65%. The left ventricle has normal function. The left ventricle has no regional wall motion abnormalities. There is moderate left ventricular hypertrophy. Left ventricular diastolic parameters are consistent with Grade I diastolic dysfunction (impaired relaxation). Elevated left ventricular end-diastolic pressure. The E/e' is 34.  2. Right ventricular systolic function is normal. The right ventricular size is normal.  3. The pericardial effusion is posterior to the left ventricle.  4. The mitral valve is abnormal. Mild mitral valve regurgitation. Moderate mitral annular calcification.  5. The aortic valve is tricuspid. Aortic valve regurgitation is not visualized. Mild aortic valve sclerosis is present, with no evidence of aortic valve stenosis.    Neuro/Psych TIACVA    GI/Hepatic negative GI ROS, Neg liver ROS,   Endo/Other  negative endocrine ROSdiabetes  Renal/GU CRF, ESRF and DialysisRenal disease     Musculoskeletal negative musculoskeletal ROS (+)   Abdominal   Peds  Hematology negative hematology ROS (+)   Anesthesia Other Findings   Reproductive/Obstetrics                             Anesthesia Physical  Anesthesia Plan  ASA: 3  Anesthesia Plan: MAC   Post-op Pain Management:    Induction: Intravenous  PONV Risk Score and Plan: 3 and Ondansetron, Treatment may vary due to age or medical condition and Propofol infusion  Airway Management Planned: Natural Airway  Additional Equipment: None  Intra-op Plan:   Post-operative Plan:   Informed Consent: I have reviewed the patients History and Physical, chart, labs and discussed the procedure including the risks, benefits and alternatives for the proposed anesthesia with the patient or authorized representative who has indicated his/her understanding and acceptance.     Dental advisory given  Plan Discussed with: CRNA  Anesthesia Plan Comments:        Anesthesia Quick Evaluation

## 2021-04-26 NOTE — Progress Notes (Signed)
Patient ID: [name], female   DOB: 11-18-1940, 80 y.o.   MRN: [id number] [facility] Progress Note   Assessment/ Plan:   Acute blood loss/GI bleed: With declining hemoglobin and hematocrit trend noted as an outpatient leading up to her hospitalization and today status post EGD that showed gastric antral vascular ectasia treated with argon plasma coagulation and recommendations for PPI.  Continue ESA and intravenous iron for associated anemia of ESRD. End-stage renal disease: She underwent hemodialysis yesterday off schedule and will be placed on dialysis schedule again for tomorrow to continue her outpatient MWF regimen. Hypertension/volume: Marginal hypotension noted overnight, yesterday had evidence of volume excess status post PRBC transfusion and was able to tolerate HD/UF with symptomatic improvement. Metabolic bone disease -I will continue phosphorus binder with resumption of oral intake Atrial fibrillation: Currently in sinus rhythm and Eliquis can be restarted again tomorrow for mentation.  Diltiazem and amiodarone on hold. Nutrition -low albumin level noted on labs, continue nutritional supplementation with renal diet Type 2 diabetes mellitus: Management per primary service  Subjective:   Denies any chest pain or shortness of breath and feels like she is "in the twilight zone" after EGD earlier today.   Objective:   BP 128/61 (BP Location: Left Arm)   Pulse 80   Temp 98.3 F (36.8 C) (Oral)   Resp 16   Wt 66.6 kg   SpO2 98%   BMI 25.20 kg/m   Physical Exam: Gen: Comfortably resting in bed, easy to awaken and engage in conversation CVS: Pulse regular rhythm, normal rate, S1 and S2 with ejection systolic murmur Resp: Clear to auscultation bilaterally, no rales/rhonchi, right IJ TDC Abd: Soft, flat, nontender Ext: No lower extremity edema  Labs: BMET Recent Labs  Lab 04/24/21 1308 04/25/21 0537 04/26/21 0201  NA 139 137 136  K 5.0 5.3* 4.7  CL 97*  97* 99  CO2 23 23 27   GLUCOSE 174* 136* 122*  BUN 62* 72* 34*  CREATININE 4.84* 5.49* 3.57*  CALCIUM 9.4 9.3 8.8*  PHOS  --  7.5* 5.3*   CBC Recent Labs  Lab 04/24/21 1308 04/24/21 1951 04/25/21 0205 04/25/21 1656 04/26/21 0201 04/26/21 0809  WBC 8.6 10.2 9.6 18.2* 12.7*  --   NEUTROABS 7.4  --  8.3*  --  11.3*  --   HGB 5.2* 6.5* 8.0* 8.4* 6.9* 8.5*  HCT 18.3* 21.6* 26.1* 26.8* 22.4* 26.8*  MCV 99.5 93.9 91.9 89.6 90.7  --   PLT 258 231 246 270 228  --     Medications:     atorvastatin  20 mg Oral QHS   Chlorhexidine Gluconate Cloth  6 each Topical Q0600   insulin aspart  0-6 Units Subcutaneous TID WC   linaclotide  72 mcg Oral QAC supper   pantoprazole  40 mg Oral BID   sodium chloride flush  3 mL Intravenous Q12H   sucralfate  1 g Oral TID WC & HS   Elmarie Shiley, MD 04/26/2021, 10:56 AM

## 2021-04-26 NOTE — Op Note (Signed)
[facility] Patient Name: [name] Procedure Date : 04/26/2021 MRN: [id number] Attending MD: Gladstone Pih. Candis Schatz , MD Date of Birth: June 22, 1941 CSN: [id number] Age: 80 Admit Type: Inpatient Procedure:                Upper GI endoscopy Indications:              Acute post hemorrhagic anemia Providers:                Nicki Reaper E. Candis Schatz, MD, Doristine Johns, RN,                            Benetta Spar, Technician Referring MD:              Medicines:                Monitored Anesthesia Care Complications:            No immediate complications. Estimated Blood Loss:     Estimated blood loss was minimal. Procedure:                Pre-Anesthesia Assessment:                           - Prior to the procedure, a History and Physical                            was performed, and patient medications and                            allergies were reviewed. The patient's tolerance of                            previous anesthesia was also reviewed. The risks                            and benefits of the procedure and the sedation                            options and risks were discussed with the patient.                            All questions were answered, and informed consent                            was obtained. Prior Anticoagulants: The patient has                            taken Eliquis (apixaban), last dose was 2 days                            prior to procedure. ASA Grade Assessment: III - A                            patient with severe systemic disease. After  reviewing the risks and benefits, the patient was                            deemed in satisfactory condition to undergo the                            procedure.                           After obtaining informed consent, the endoscope was                            passed under direct vision. Throughout the                            procedure, the patient's blood pressure,  pulse, and                            oxygen saturations were monitored continuously. The                            [id number]-H190 ([id number]) Olympus endoscope was introduced                            through the mouth, and advanced to the third part                            of duodenum. The upper GI endoscopy was                            accomplished without difficulty. The patient                            tolerated the procedure well. Scope In: Scope Out: Findings:      The examined portions of the nasopharynx, oropharynx and larynx were       normal.      One tongue of salmon-colored mucosa was present. No other visible       abnormalities were present. The maximum longitudinal extent of these       esophageal mucosal changes was 1 cm in length. Biopsies were taken with       a cold forceps for histology. Estimated blood loss was minimal.      The exam of the esophagus was otherwise normal.      Mild, localized gastric antral vascular ectasia without bleeding was       present in the gastric antrum. Coagulation for tissue destruction using       argon plasma at 1 liter/minute and 20 watts was successful. Estimated       blood loss: none.      The exam of the stomach was otherwise normal.      The examined duodenum was normal. Impression:               - The examined portions of the nasopharynx,  oropharynx and larynx were normal.                           - Salmon-colored mucosa suspicious for                            short-segment Barrett's esophagus. Biopsied.                           - Gastric antral vascular ectasia without bleeding.                            Treated with argon plasma coagulation (APC). This                            is the source of the patient's anemia and occult GI                            blood loss. This may require periodic repeat                            ablation                           - Normal examined  duodenum. Recommendation:           - Return patient to hospital ward for ongoing care.                           - Clear liquid diet today.                           - Resume Eliquis (apixaban) at prior dose tomorrow.                           - Use a proton pump inhibitor PO BID for 8 weeks,                            then daily.                           - Use sucralfate suspension 1 gram PO QID for 2                            weeks.                           - If patient does not demonstrate rebleeding or                            significant drop in hemoglobin tomorrow, she can be                            discharged from a GI standpoint.                           -  GI will sign off for now, please reconsult if                            further questions/problems arise.                           - CBC should be monitored periodically as an                            outpatient as she is at increased risk of ongoing                            slow GI bleed.                           - Recommend she be discharged with oral iron                            supplementation. Procedure Code(s):        --- Professional ---                           [phone], Esophagogastroduodenoscopy, flexible,                            transoral; with ablation of tumor(s), polyp(s), or                            other lesion(s) (includes pre- and post-dilation                            and guide wire passage, when performed) Diagnosis Code(s):        --- Professional ---                           K22.8, Other specified diseases of esophagus                           K31.819, Angiodysplasia of stomach and duodenum                            without bleeding                           D62, Acute posthemorrhagic anemia CPT copyright 2019 American Medical Association. All rights reserved. The codes documented in this report are preliminary and upon coder review may  be revised to meet current compliance  requirements. Prapti Grussing E. Candis Schatz, MD 04/26/2021 10:07:07 AM This report has been signed electronically. Number of Addenda: 0

## 2021-04-26 NOTE — Anesthesia Postprocedure Evaluation (Signed)
Anesthesia Post Note  Patient: [name]  Procedure(s) Performed: ESOPHAGOGASTRODUODENOSCOPY (EGD) WITH PROPOFOL HOT HEMOSTASIS (ARGON PLASMA COAGULATION/BICAP) BIOPSY     Patient location during evaluation: PACU Anesthesia Type: MAC Level of consciousness: awake and alert Pain management: pain level controlled Vital Signs Assessment: post-procedure vital signs reviewed and stable Respiratory status: spontaneous breathing Cardiovascular status: stable Anesthetic complications: no   No notable events documented.  Last Vitals:  Vitals:   04/26/21 1043 04/26/21 1146  BP: 128/61 134/65  Pulse: 80 84  Resp: 16 15  Temp: 36.8 C 36.7 C  SpO2: 98% 92%    Last Pain:  Vitals:   04/26/21 1146  TempSrc: Oral  PainSc:                  Nolon Nations

## 2021-04-26 NOTE — Progress Notes (Signed)
HD 1 SUBJECTIVE:   Patient Summary: [name] is a 80 y.o. female (she/her) with a pertinent PMH of ESRD (MWF), atrial fibrillation on Eliquis, T2DM, HLD, HTN, Chronic GI bleeds, malignant lobular carcinoma of right breast status post right lumpectomy and radiation therapy, who presents to [facility] (04/24/2021) with Hb 5.2. [name]'s daughter ([name]) is at bedside to assist with history.   Overnight Events: She required 1 unit of packed red blood cell for hemoglobin 6.9.  Patient evaluated at bedside after EGD.  She states that she is feeling much better today.  She denies any abdominal pain, hematochezia, melena.  We reviewed the results of her EGD and she expressed understanding.  We discussed that next steps are weaning oxygen and she is in agreement.  OBJECTIVE:  Vital Signs: Vitals:   04/26/21 0824 04/26/21 0844 04/26/21 0953 04/26/21 1008  BP: 139/64  (!) 95/56 118/62  Pulse: 90  78 77  Resp: 15  20 17   Temp: 99.4 F (37.4 C)  98.1 F (36.7 C) 98 F (36.7 C)  TempSrc: Temporal     SpO2: 91% 95% 93% 95%  Weight:       Supplemental O2: NRB SpO2: 95 % O2 Flow Rate (L/min): 8 L/min FiO2 (%): 100 %  Filed Weights   04/25/21 1206 04/26/21 0529  Weight: 68.8 kg 66.6 kg    Intake/Output Summary (Last 24 hours) at 04/26/2021 1024 Last data filed at 04/26/2021 0945 Gross per 24 hour  Intake 581 ml  Output 2669 ml  Net -2088 ml    Net IO Since Admission: -818 mL [04/26/21 1024]  Physical Exam: Physical Exam Vitals and nursing note reviewed.  Constitutional:      General: She is not in acute distress.    Appearance: She is normal weight.  HENT:     Head: Normocephalic and atraumatic.  Pulmonary:     Effort: Pulmonary effort is normal. No respiratory distress.  Abdominal:     General: There is no distension.     Palpations: Abdomen is soft.     Tenderness: There is no abdominal tenderness.  Musculoskeletal:     Right lower leg: No  edema.     Left lower leg: No edema.  Skin:    General: Skin is warm.     Coloration: Skin is pale.  Neurological:     Mental Status: She is alert and oriented to person, place, and time. Mental status is at baseline.  Psychiatric:        Mood and Affect: Mood normal.        Behavior: Behavior normal.        Thought Content: Thought content normal.        Judgment: Judgment normal.   Pertinent Labs: CBC Latest Ref Rng & Units 04/26/2021 04/26/2021 04/25/2021  WBC 4.0 - 10.5 K/uL - 12.7(H) 18.2(H)  Hemoglobin 12.0 - 15.0 g/dL 8.5(L) 6.9(LL) 8.4(L)  Hematocrit 36.0 - 46.0 % 26.8(L) 22.4(L) 26.8(L)  Platelets 150 - 400 K/uL - 228 270    CMP Latest Ref Rng & Units 04/26/2021 04/25/2021 04/24/2021  Glucose 70 - 99 mg/dL 122(H) 136(H) 174(H)  BUN 8 - 23 mg/dL 34(H) 72(H) 62(H)  Creatinine 0.44 - 1.00 mg/dL 3.57(H) 5.49(H) 4.84(H)  Sodium 135 - 145 mmol/L 136 137 139  Potassium 3.5 - 5.1 mmol/L 4.7 5.3(H) 5.0  Chloride 98 - 111 mmol/L 99 97(L) 97(L)  CO2 22 - 32 mmol/L 27 23 23   Calcium 8.9 -  10.3 mg/dL 8.8(L) 9.3 9.4  Total Protein 6.5 - 8.1 g/dL - - 6.0(L)  Total Bilirubin 0.3 - 1.2 mg/dL - - 0.3  Alkaline Phos 38 - 126 U/L - - 76  AST 15 - 41 U/L - - 17  ALT 0 - 44 U/L - - 14    Recent Labs    04/25/21 2109 04/26/21 0636 04/26/21 0756  GLUCAP 141* 137* 123*      Pertinent Imaging: No results found.  ASSESSMENT/PLAN:  Assessment: Active Problems:   Acute blood loss anemia  [name] is a 80 y.o. female, with pertinent PMHx of ESRD (MWF), atrial fibrillation on Eliquis, gastritis/duodenitis complicated by duodenal ulcer, previous right basal ganglia hemorrhage who presents to the ED with low hemoglobin and admitted for acute blood loss anemia.   Plan:  # Upper GI Bleed 2/2 Gastric Antral Vascular Ectasia (GAVE) # Acute Blood Loss Anemia  S/p 3 units of pRBCs over during of admission. EGD today with evidence of GAVE s/p ablation, likely associated with her ESRD.    - Discontinue IV Protonix  - Start PO Protonix 40 mg BID for 8 weeks.  - Start sucralfate 1 g QID for 2 weeks  - Continue IV iron supplementation with HD - Trend hemoglobin  - Transfuse PRN for hemoglobin < 7 - Continue holding Eliquis   # Pulmonary Edema  Secondary to missed dialysis prior to arrival and volume overload secondary to transfusions. Improved with dialysis.   - Continue supplemental oxygen to maintain oxygen saturation > 90%   # ESRD (MWF) Secondary to diabetic nephrosclerosis. Follows with [facility]. Tolerated HD yesterday well other than mild hypotension improved with Albumin. Total UF 2.6L,   - Nephrology consulted, appreciate recommendations and assistance. - HD per nephrology  # Post-stroke Recrudescence  # Hx of CVA # Intracranial Atherosclerosis, Severe  Acute neurological deficits on admission secondary to hypoperfusion in the setting of severe anemia. Ultimately led to recrudescence event. Symptoms resolved at this time.   # Sinus Bradycardia # Hx of Atrial Fibrillation  Currently on Diltiazem and Amiodarone. Anticoagulated with Eliquis. HAS-BLED score of 4.  -Continue to hold Amiodarone and Diltiazem in light of bradycardia and hypotension during HD -Continue to hold Eliquis   # Type 2 Diabetes Mellitus  On Xultophy at home.  -SSI (sensitive) -Trend CBG  # Right Foot Drop  - Outpatient neurology follow up recommended  Best Practice: Diet: NPO IVF: None,None VTE: None Code: Full Code  PT/OT recs: Pending  Prior to Admission Living Arrangement: Home  Anticipated Discharge Location: TBD  Barriers to Discharge: GI bleed and symptomatic anemia  Dispo: Anticipated discharge in 1-2 days.  Signature: Dr. Jose Persia Internal Medicine PGY-3  Pager: [phone] After 5pm on weekdays and 1pm on weekends: On Call pager [phone]  04/26/2021, 10:42 AM

## 2021-04-26 NOTE — Transfer of Care (Signed)
Immediate Anesthesia Transfer of Care Note  Patient: [name]  Procedure(s) Performed: ESOPHAGOGASTRODUODENOSCOPY (EGD) WITH PROPOFOL HOT HEMOSTASIS (ARGON PLASMA COAGULATION/BICAP) BIOPSY  Patient Location: PACU  Anesthesia Type:MAC  Level of Consciousness: sedated  Airway & Oxygen Therapy: Patient Spontanous Breathing and Patient connected to face mask oxygen  Post-op Assessment: Report given to RN and Post -op Vital signs reviewed and stable  Post vital signs: Reviewed and stable  Last Vitals:  Vitals Value Taken Time  BP 95/56 04/26/21 0953  Temp    Pulse 78 04/26/21 0954  Resp 20 04/26/21 0954  SpO2 92 % 04/26/21 0954  Vitals shown include unvalidated device data.  Last Pain:  Vitals:   04/26/21 0824  TempSrc: Temporal  PainSc: 0-No pain         Complications: No notable events documented.

## 2021-04-27 ENCOUNTER — Encounter (HOSPITAL_COMMUNITY): Payer: Self-pay | Admitting: Gastroenterology

## 2021-04-27 ENCOUNTER — Other Ambulatory Visit: Payer: Self-pay

## 2021-04-27 LAB — TYPE AND SCREEN
ABO/RH(D): O POS
Antibody Screen: NEGATIVE
Unit division: 0
Unit division: 0
Unit division: 0

## 2021-04-27 LAB — RENAL FUNCTION PANEL
Albumin: 2.4 g/dL — ABNORMAL LOW (ref 3.5–5.0)
Anion gap: 13 (ref 5–15)
BUN: 60 mg/dL — ABNORMAL HIGH (ref 8–23)
CO2: 24 mmol/L (ref 22–32)
Calcium: 8.8 mg/dL — ABNORMAL LOW (ref 8.9–10.3)
Chloride: 98 mmol/L (ref 98–111)
Creatinine, Ser: 5.29 mg/dL — ABNORMAL HIGH (ref 0.44–1.00)
GFR, Estimated: 8 mL/min — ABNORMAL LOW (ref >60)
Glucose, Bld: 153 mg/dL — ABNORMAL HIGH (ref 70–99)
Phosphorus: 6.2 mg/dL — ABNORMAL HIGH (ref 2.5–4.6)
Potassium: 4.4 mmol/L (ref 3.5–5.1)
Sodium: 135 mmol/L (ref 135–145)

## 2021-04-27 LAB — CBC
HCT: 26.5 % — ABNORMAL LOW (ref 36.0–46.0)
HCT: 26.6 % — ABNORMAL LOW (ref 36.0–46.0)
Hemoglobin: 8.4 g/dL — ABNORMAL LOW (ref 12.0–15.0)
Hemoglobin: 8.2 g/dL — ABNORMAL LOW (ref 12.0–15.0)
MCH: 28.5 pg (ref 26.0–34.0)
MCH: 28.2 pg (ref 26.0–34.0)
MCHC: 31.7 g/dL (ref 30.0–36.0)
MCHC: 30.8 g/dL (ref 30.0–36.0)
MCV: 89.8 fL (ref 80.0–100.0)
MCV: 91.4 fL (ref 80.0–100.0)
Platelets: 228 10*3/uL (ref 150–400)
Platelets: 243 10*3/uL (ref 150–400)
RBC: 2.95 MIL/uL — ABNORMAL LOW (ref 3.87–5.11)
RBC: 2.91 MIL/uL — ABNORMAL LOW (ref 3.87–5.11)
RDW: 16.5 % — ABNORMAL HIGH (ref 11.5–15.5)
RDW: 16.5 % — ABNORMAL HIGH (ref 11.5–15.5)
WBC: 10.3 10*3/uL (ref 4.0–10.5)
WBC: 10.3 10*3/uL (ref 4.0–10.5)
nRBC: 0 % (ref 0.0–0.2)
nRBC: 0 % (ref 0.0–0.2)

## 2021-04-27 LAB — GLUCOSE, CAPILLARY
Glucose-Capillary: 136 mg/dL — ABNORMAL HIGH (ref 70–99)
Glucose-Capillary: 125 mg/dL — ABNORMAL HIGH (ref 70–99)
Glucose-Capillary: 184 mg/dL — ABNORMAL HIGH (ref 70–99)
Glucose-Capillary: 174 mg/dL — ABNORMAL HIGH (ref 70–99)

## 2021-04-27 LAB — BPAM RBC
Blood Product Expiration Date: 202210312359
Blood Product Expiration Date: 202210312359
Blood Product Expiration Date: 202211022359
ISSUE DATE / TIME: 202209301646
ISSUE DATE / TIME: 202209302253
ISSUE DATE / TIME: 202210020318
Unit Type and Rh: 5100
Unit Type and Rh: 5100
Unit Type and Rh: 5100

## 2021-04-27 NOTE — Progress Notes (Signed)
Pt returned from dialysis, VSS, lunch ordered, call light within reach, will continue to monitor.  Chrisandra Carota, RN 04/27/2021 1:01 PM

## 2021-04-27 NOTE — Discharge Instructions (Addendum)
Dear Ms. Shrout, I am so glad you are feeling better and can be discharged today! You were admitted because of low hemoglobin that made you feel tired and weak.   Please see the following instructions: For your: Please follow up with your primary care doctor in regards to your hemoglobin level and management of your medicines within 1 week. Abdominal pain and inflammation of your stomach lining Please continue to take Protonix 40 mg twice a day for 8 weeks (in date 06/29/2021).  Then transition to Protonix 40 mg once a day thereafter. Please continue to take Sulcrate 1 g 4 times daily for 2 weeks (end date 05/11/2021) Blood loss anemia (low hemoglobin) Do not take Eliquis, for your risk of bleeding in your stomach, that is most likely causing your low hemoglobin. Please resume it, after your primary care provider evaluates you. Do not take your Diltiazem, because it makes her blood pressure dropped during dialysis.  Please see your primary care provider before restarting this medicine. You can restart Amiodarone, as this medicine will not affect your blood pressure. Right Foot Drop (weak right foot) AFO (ankle foot orthosis) has been ordered for you Please see a neurologist outpatient, for follow-up  It was a pleasure meeting you, Ms. Hauth.  I wish you and your family the best!

## 2021-04-27 NOTE — Progress Notes (Signed)
Pt refused bipap for the night.

## 2021-04-27 NOTE — Progress Notes (Signed)
Orthopedic Tech Progress Note Patient Details:  [name] 12/06/1940 [id number]  Called in order to [facility] for an AFO   Patient ID: [name], female   DOB: 04/22/1941, 80 y.o.   MRN: [id number]  [name] 04/27/2021, 11:05 AM

## 2021-04-27 NOTE — Progress Notes (Signed)
PT Cancellation Note  Patient Details Name: [name] MRN: [id number] DOB: Nov 24, 1940   Cancelled Treatment:    Reason Eval/Treat Not Completed: Patient at procedure or test/unavailable (HD). Will follow-up for PT Evaluation as schedule permits.  Mabeline Caras, PT, DPT Acute Rehabilitation Services  Pager [phone] Office [phone]  Derry Lory 04/27/2021, 9:43 AM

## 2021-04-27 NOTE — Progress Notes (Addendum)
HD 2 SUBJECTIVE:   Patient Summary: [name] is a 80 y.o. female (she/her) with a pertinent PMH of ESRD (MWF), atrial fibrillation on Eliquis, T2DM, HLD, HTN, Chronic GI bleeds, malignant lobular carcinoma of right breast status post right lumpectomy and radiation therapy, who presents to [facility] (04/24/2021) with Hb 5.2. [name]'s daughter (Amy Presnell) is at bedside to assist with history.   Overnight Events: None  Patient evaluated at bedside during HD. She states that she is feeling much better today.  She denies any abdominal pain, hematochezia, melena.  We reviewed the results of her EGD and she expressed understanding. We discussed that next steps are weaning oxygen and she is in agreement.  OBJECTIVE:  Vital Signs: Vitals:   04/27/21 1130 04/27/21 1200 04/27/21 1241 04/27/21 1300  BP: 140/75 (!) 148/43 137/74 131/72  Pulse: 78 79  85  Resp:   14 20  Temp:   98.2 F (36.8 C) 98.7 F (37.1 C)  TempSrc:   Oral Oral  SpO2:   96% 96%  Weight:   63.1 kg    Supplemental O2: N SpO2: 96 % O2 Flow Rate (L/min): 4 L/min FiO2 (%): 100 %  Filed Weights   04/27/21 0545 04/27/21 0838 04/27/21 1241  Weight: 66.2 kg 65.1 kg 63.1 kg    Intake/Output Summary (Last 24 hours) at 04/27/2021 1425 Last data filed at 04/27/2021 1220 Gross per 24 hour  Intake 361 ml  Output 2017 ml  Net -1656 ml   Net IO Since Admission: -2,234 mL [04/27/21 1425]  Physical Exam: Physical Exam Vitals and nursing note reviewed.  Constitutional:      General: She is not in acute distress.    Appearance: She is normal weight.  HENT:     Head: Normocephalic and atraumatic.  Cardiovascular:     Rate and Rhythm: Normal rate and regular rhythm.     Pulses: Normal pulses.     Heart sounds: Normal heart sounds.  Pulmonary:     Effort: Pulmonary effort is normal. No respiratory distress.     Breath sounds: Examination of the left-middle field reveals rales. Examination of the  right-lower field reveals rales. Examination of the left-lower field reveals rales. Rales present. No wheezing.  Chest:     Chest wall: No tenderness.  Abdominal:     General: There is no distension.     Palpations: Abdomen is soft.     Tenderness: There is no abdominal tenderness.  Musculoskeletal:     Right lower leg: No edema.     Left lower leg: No edema.  Skin:    General: Skin is warm.     Coloration: Skin is pale.  Neurological:     Mental Status: She is alert and oriented to person, place, and time. Mental status is at baseline.     Cranial Nerves: Cranial nerves are intact.     Sensory: Sensation is intact.     Motor: Weakness present.     Comments: 4/5 strength in left hand grip, otherwise UE 5/5 strength Right foot drop, otherwise LE 5/5 strength  Psychiatric:        Mood and Affect: Mood normal.        Behavior: Behavior normal.        Thought Content: Thought content normal.        Judgment: Judgment normal.   Pertinent Labs: CBC Latest Ref Rng & Units 04/27/2021 04/27/2021 04/26/2021  WBC 4.0 - 10.5 K/uL 10.3 10.3 -  Hemoglobin 12.0 - 15.0 g/dL 8.2(L) 8.4(L) 8.5(L)  Hematocrit 36.0 - 46.0 % 26.6(L) 26.5(L) 26.8(L)  Platelets 150 - 400 K/uL 243 228 -    CMP Latest Ref Rng & Units 04/27/2021 04/26/2021 04/25/2021  Glucose 70 - 99 mg/dL 153(H) 122(H) 136(H)  BUN 8 - 23 mg/dL 60(H) 34(H) 72(H)  Creatinine 0.44 - 1.00 mg/dL 5.29(H) 3.57(H) 5.49(H)  Sodium 135 - 145 mmol/L 135 136 137  Potassium 3.5 - 5.1 mmol/L 4.4 4.7 5.3(H)  Chloride 98 - 111 mmol/L 98 99 97(L)  CO2 22 - 32 mmol/L 24 27 23   Calcium 8.9 - 10.3 mg/dL 8.8(L) 8.8(L) 9.3  Total Protein 6.5 - 8.1 g/dL - - -  Total Bilirubin 0.3 - 1.2 mg/dL - - -  Alkaline Phos 38 - 126 U/L - - -  AST 15 - 41 U/L - - -  ALT 0 - 44 U/L - - -    Recent Labs    04/26/21 2145 04/27/21 0644 04/27/21 1316  GLUCAP 176* 136* 125*     Pertinent Imaging: No results found.  ASSESSMENT/PLAN:  Assessment: Active  Problems:   Acute blood loss anemia  [name] is a 80 y.o. female, with pertinent PMHx of ESRD (MWF), atrial fibrillation on Eliquis, gastritis/duodenitis complicated by duodenal ulcer, previous right basal ganglia hemorrhage who presents to the ED with low hemoglobin and admitted for acute blood loss anemia.   Plan: # Upper GI Bleed 2/2 Gastric Antral Vascular Ectasia (GAVE) # Acute Blood Loss Anemia  Likely secondary to GAVE s/p ablation evident on EGD (04/26/2021) and likely associated with ESRD. S/p 3 units of pRBCs over during of admission. Down trending hemoglobin today. - PO Protonix 40 mg BID for 8 weeks, then daily (start 10/2, daily after 12/5) - Sucralfate 1 g QID for 2 weeks (start 10/2, 10/17) - Continue IV iron supplementation with HD - Trend hemoglobin  - Transfuse PRN for hemoglobin < 7 - Continue holding Eliquis  - Will recommend PO iron as outpatient  # Pulmonary Edema  Secondary to missed dialysis prior to arrival and volume overload secondary to transfusions. Improved with dialysis. At baseline patient, requires no supplemental O2 at home. During HD, SpO2 >90% at room air. - Continue supplemental oxygen to maintain oxygen saturation > 90%   # ESRD (MWF) Secondary to diabetic nephrosclerosis. Follows with [facility] Tolerated HD today well, and no electrolyte derangement. - Nephrology consulted, appreciate recommendations and assistance. - HD per nephrology  # Post-stroke Recrudescence  # Hx of CVA # Intracranial Atherosclerosis, Severe  Acute neurological deficits on admission secondary to hypoperfusion in the setting of severe anemia. Ultimately led to recrudescence event. Symptoms resolved at this time.   # Sinus Bradycardia # Hx of Atrial Fibrillation  Currently on Diltiazem and Amiodarone. Anticoagulated with Eliquis. HAS-BLED score of 4.  -Continue to hold Amiodarone and Diltiazem in light of bradycardia and hypotension during HD -Continue to hold  Eliquis   # Type 2 Diabetes Mellitus  On Xultophy at home.  -SSI (sensitive) -Trend CBG  # Right Foot Drop  - Ordered AFO - Outpatient neurology follow up recommended  # IBS -Restarted Linzess  Best Practice: Diet: Heart healthy IVF: None,None VTE: None Code: Full Code  PT/OT recs: [facility]  Prior to Admission Living Arrangement: Home  Anticipated Discharge Location: TBD  Barriers to Discharge: Supplemental O2 requirements >4L, 2/2 hypervolemic state  Dispo: Anticipated discharge in 1-2 days.  Signature: Merrily Brittle, DO Psychiatry Resident, PGY-1  [facility] Pontes IMTS Pager: [phone] After 5pm on weekdays and 1pm on weekends: On Call pager [phone]  04/27/2021, 2:25 PM

## 2021-04-27 NOTE — Progress Notes (Signed)
  [facility] Progress Note   Subjective:  Seen on HD - mild dyspnea (using O2), but much improved. No CP or abdominal pain.  Objective Vitals:   04/27/21 0545 04/27/21 0736 04/27/21 0838 04/27/21 0849  BP: (!) 152/77 (!) 165/81 (!) 147/65 (!) 149/79  Pulse: 78 84 82 81  Resp: 15 18 16    Temp: 98.3 F (36.8 C) 97.9 F (36.6 C) 98.4 F (36.9 C)   TempSrc: Oral Oral Oral   SpO2: 94% 96% 92% 96%  Weight: 66.2 kg  65.1 kg    Physical Exam General: Well appearing woman, NAD. Nasal O2 Heart: RRR; no murmur Lungs: CTAB Abdomen: soft, non-tender Extremities: No LE edema Dialysis Access: TDC in R chest  Additional Objective Labs: Basic Metabolic Panel: Recent Labs  Lab 04/24/21 1308 04/25/21 0537 04/26/21 0201  NA 139 137 136  K 5.0 5.3* 4.7  CL 97* 97* 99  CO2 23 23 27   GLUCOSE 174* 136* 122*  BUN 62* 72* 34*  CREATININE 4.84* 5.49* 3.57*  CALCIUM 9.4 9.3 8.8*  PHOS  --  7.5* 5.3*   Liver Function Tests: Recent Labs  Lab 04/24/21 1308 04/25/21 0537 04/26/21 0201  AST 17  --   --   ALT 14  --   --   ALKPHOS 76  --   --   BILITOT 0.3  --   --   PROT 6.0*  --   --   ALBUMIN 2.9* 2.7* 2.3*   CBC: Recent Labs  Lab 04/24/21 1308 04/24/21 1951 04/25/21 0205 04/25/21 1656 04/26/21 0201 04/26/21 0809 04/27/21 0111  WBC 8.6 10.2 9.6 18.2* 12.7*  --  10.3  NEUTROABS 7.4  --  8.3*  --  11.3*  --   --   HGB 5.2* 6.5* 8.0* 8.4* 6.9* 8.5* 8.4*  HCT 18.3* 21.6* 26.1* 26.8* 22.4* 26.8* 26.5*  MCV 99.5 93.9 91.9 89.6 90.7  --  89.8  PLT 258 231 246 270 228  --  228   Medications:  sodium chloride     sodium chloride      atorvastatin  20 mg Oral QHS   Chlorhexidine Gluconate Cloth  6 each Topical Q0600   insulin aspart  0-6 Units Subcutaneous TID WC   linaclotide  72 mcg Oral QAC supper   pantoprazole  40 mg Oral BID   sodium chloride flush  3 mL Intravenous Q12H   sucralfate  1 g Oral TID WC & HS    Dialysis Orders: MWF [place] 3:30hr,  400/500, EDW 65.5kg, 2K/2Ca, TDC, no heparin - Mircera 219mcg IV q 2 weeks (last 9/21) - Calcitriol 0.37mcg PO q HD  Assessment/Plan: 1. GI bleed/symptomatic anemia: S/p EGD 10/2 with localized gastric antrum vascular ectasia s/p APC treatment. S/p 3U PRBCs. For PPI BID for 8 weeks. Due for ESA on 10/5. 2. ESRD: Continue HD on MWF schedule - HD today. 2L UFG and tolerating. 3. HTN/volume: BP slightly high, UF as tolerated 4. Secondary hyperparathyroidism: CorrCa/Phos good - follow. 5. Nutrition: Alb low, adding protein supplements. 6. A-Fib: Eliquis held on admit, resume per primary. Diltiazem and amiodarone on hold as well for bracycardia. 7. T2DM   Veneta Penton, PA-C 04/27/2021, 8:53 AM  [employer]

## 2021-04-27 NOTE — Progress Notes (Signed)
Interval Progress Note:   Patient evaluated at bedside. She is currently on 1 L saturating at 95% however pulse ox pleth is inconsistent. Pulse ox moved to left finger. When weaned to room air, patient desaturated to 84-85% with improvement when increased to 2L via [place].    Dr. Jose Persia Internal Medicine PGY-3  Pager: [phone] After 5pm on weekdays and 1pm on weekends: On Call pager [phone]  04/27/2021, 6:06 PM

## 2021-04-27 NOTE — Hospital Course (Addendum)
Instructions Please follow up with your primary care doctor in regards to your hemoglobin level and management of your medicines Abdominal pain and inflammation of your stomach lining Please continue to take Protonix 40 mg twice a day for 8 weeks (in date 06/29/2021).  Then transition to Protonix 40 mg once a day thereafter. Please continue to take Sulcrate 1 g 4 times daily for 2 weeks (end date 05/11/2021) Do not take Eliquis, for your risk of bleeding in your stomach, that is most likely causing your low hemoglobin. Please resume it, after your primary care provider assess you.

## 2021-04-27 NOTE — Evaluation (Signed)
Occupational Therapy Evaluation Patient Details Name: [name] MRN: [id number] DOB: [birth year] Today's Date: 04/27/2021   History of Present Illness Pt is a 80 y/o female who presented to ED due to low hemoglobin. Pt admitted for further work up of possible GI bleed with blood transfusion and underwent EGD on 10/2. PMH: ESRD (on HD), a fib, gastritis/duodenitis complicated by duodenal ulcer, previous right basal ganglia hemorrhage.   Clinical Impression   PTA, pt lives with spouse and reports Modified [place] with ADLs and mobility using RW. Pt reports recently working with [facility] and shares IADLs with spouse. Pt presents now with deficits in cardiopulmonary tolerance, standing balance, and strength. Pt requires Min A for bed mobility, basic transfers using RW with hands-on assist needed due to L LE buckling. Pt requires Setup Assist for UB ADLs and Mod A for LB ADLs at this time. Pt able to tolerate O2 weaning from 8 L to 4 L O2 with SpO2 95% and above during session. Pt with desats to 88% when laying flat and reports difficulty breathing when flat. Anticipate pt to progress well to [facility] follow-up with family support.    Recommendations for follow up therapy are one component of a multi-disciplinary discharge planning process, led by the attending physician.  Recommendations may be updated based on patient status, additional functional criteria and insurance authorization.   Follow Up Recommendations  Home health OT    Equipment Recommendations  None recommended by OT    Recommendations for Other Services       Precautions / Restrictions Precautions Precautions: Fall;Other (comment) Precaution Comments: monitor O2 (does not wear at baseline), R foot drop, L LE buckling Restrictions Weight Bearing Restrictions: No      Mobility Bed Mobility Overal bed mobility: Needs Assistance Bed Mobility: Rolling;Sidelying to Sit Rolling: Min guard Sidelying to sit: Min assist        General bed mobility comments: Min A to lift trunk, encouraged log rolling due to abdominal discomfort from EGD    Transfers Overall transfer level: Needs assistance Equipment used: Rolling walker (2 wheeled) Transfers: Sit to/from [facility] Sit to Stand: Min guard Stand pivot transfers: Min assist       General transfer comment: min guard for power up x 2 at bedside, appropriate hand placement on RW. cues for posture and tucking bottom in. L LE noted to buckle but improved with activity and BUE support.    Balance Overall balance assessment: Needs assistance Sitting-balance support: No upper extremity supported;Feet supported Sitting balance-Leahy Scale: Fair     Standing balance support: Bilateral upper extremity supported;During functional activity Standing balance-Leahy Scale: Poor Standing balance comment: reliant on BUE support                           ADL either performed or assessed with clinical judgement   ADL Overall ADL's : Needs assistance/impaired Eating/Feeding: Independent;Sitting   Grooming: Set up;Sitting   Upper Body Bathing: Set up;Sitting   Lower Body Bathing: Moderate assistance;Sit to/from stand   Upper Body Dressing : Set up;Sitting   Lower Body Dressing: Moderate assistance;Sit to/from stand   Toilet Transfer: Minimal assistance;Stand-pivot;RW Armed forces technical officer Details (indicate cue type and reason): simulated to recliner Toileting- Clothing Manipulation and Hygiene: Moderate assistance;Sit to/from stand         General ADL Comments: Pt limited by R foot drop, L LE weakness and overall decreased endurance. Pt with new supplemental O2 needs but able to  be titrated to 4 L O2 with SpO2 WFL     Vision Ability to See in Adequate Light: 0 Adequate Patient Visual Report: No change from baseline Vision Assessment?: No apparent visual deficits     Perception     Praxis      Pertinent Vitals/Pain Pain Assessment:  Faces Faces Pain Scale: Hurts a little bit Pain Location: abdomen Pain Descriptors / Indicators: Sore Pain Intervention(s): Monitored during session     Hand Dominance Right   Extremity/Trunk Assessment Upper Extremity Assessment Upper Extremity Assessment: Generalized weakness   Lower Extremity Assessment Lower Extremity Assessment: Defer to PT evaluation   Cervical / Trunk Assessment Cervical / Trunk Assessment: Kyphotic   Communication Communication Communication: No difficulties   Cognition Arousal/Alertness: Awake/alert Behavior During Therapy: WFL for tasks assessed/performed Overall Cognitive Status: Within Functional Limits for tasks assessed                                     General Comments  Received on 8 L O2 with 88% at rest. with activity, increased >95%. Able to titrate to 6 L O2 with SpO2 98% after transfer, titrated to 4 L O2 with SpO2 95% at rest. Appears to desat with lying flat in bed and pt reports recent hx of difficulty breathing when flat    Exercises     Shoulder Instructions      Home Living Family/patient expects to be discharged to:: Private residence Living Arrangements: Spouse/significant other Available Help at Discharge: Family;Available 24 hours/day Type of Home: House Home Access: Ramped entrance     Home Layout: One level     Bathroom Shower/Tub: Occupational psychologist: Standard (standard height with toilet riser) Bathroom Accessibility: Yes   Home Equipment: Shower seat;Hand held shower head;Grab bars - tub/shower;Grab bars - toilet;Hospital bed;Toilet riser;Other (comment);Walker - 2 wheels;Wheelchair - manual (lift chair)          Prior Functioning/Environment Level of Independence: Independent with assistive device(s)        Comments: Reports use of RW for mobility, denies any recent falls. Pt reports able to complete ADLs (sponge bathing due to HD) and shares IADLs with husband though  increasingly difficult due to medical issues. Daughter has been driving pt to/from dialysis. Had been working with HHPT        OT Problem List: Decreased strength;Decreased activity tolerance;Impaired balance (sitting and/or standing);Cardiopulmonary status limiting activity      OT Treatment/Interventions: Self-care/ADL training;Therapeutic exercise;Energy conservation;DME and/or AE instruction;Therapeutic activities;Patient/family education;Balance training    OT Goals(Current goals can be found in the care plan section) Acute Rehab OT Goals Patient Stated Goal: go home, wean O2 OT Goal Formulation: With patient Time For Goal Achievement: 05/11/21 Potential to Achieve Goals: Good  OT Frequency: Min 2X/week   Barriers to D/C:            Co-evaluation              AM-PAC OT "6 Clicks" Daily Activity     Outcome Measure Help from another person eating meals?: None Help from another person taking care of personal grooming?: A Little Help from another person toileting, which includes using toliet, bedpan, or urinal?: A Lot Help from another person bathing (including washing, rinsing, drying)?: A Lot Help from another person to put on and taking off regular upper body clothing?: A Little Help from another person to put on and  taking off regular lower body clothing?: A Lot 6 Click Score: 16   End of Session Equipment Utilized During Treatment: Gait belt;Rolling walker;Oxygen Nurse Communication: Mobility status;Other (comment) (O2, AFO, L LE buckling)  Activity Tolerance: Patient tolerated treatment well Patient left: in chair;with call bell/phone within reach;with chair alarm set  OT Visit Diagnosis: Unsteadiness on feet (R26.81);Other abnormalities of gait and mobility (R26.89);Muscle weakness (generalized) (M62.81)                Time: 2548-6282 OT Time Calculation (min): 38 min Charges:  OT General Charges $OT Visit: 1 Visit OT Evaluation $OT Eval Moderate  Complexity: 1 Mod OT Treatments $Self Care/Home Management : 8-22 mins $Therapeutic Activity: 8-22 mins  Malachy Chamber, OTR/L Acute Rehab Services Office: [phone]   Layla Maw 04/27/2021, 8:27 AM

## 2021-04-28 ENCOUNTER — Inpatient Hospital Stay (HOSPITAL_COMMUNITY): Payer: Medicare HMO

## 2021-04-28 LAB — CBC WITH DIFFERENTIAL/PLATELET
Abs Immature Granulocytes: 0.04 10*3/uL (ref 0.00–0.07)
Basophils Absolute: 0 10*3/uL (ref 0.0–0.1)
Basophils Relative: 0 %
Eosinophils Absolute: 0.1 10*3/uL (ref 0.0–0.5)
Eosinophils Relative: 2 %
HCT: 25.4 % — ABNORMAL LOW (ref 36.0–46.0)
Hemoglobin: 7.8 g/dL — ABNORMAL LOW (ref 12.0–15.0)
Immature Granulocytes: 1 %
Lymphocytes Relative: 6 %
Lymphs Abs: 0.4 10*3/uL — ABNORMAL LOW (ref 0.7–4.0)
MCH: 28 pg (ref 26.0–34.0)
MCHC: 30.7 g/dL (ref 30.0–36.0)
MCV: 91 fL (ref 80.0–100.0)
Monocytes Absolute: 0.5 10*3/uL (ref 0.1–1.0)
Monocytes Relative: 9 %
Neutro Abs: 5.3 10*3/uL (ref 1.7–7.7)
Neutrophils Relative %: 82 %
Platelets: 217 10*3/uL (ref 150–400)
RBC: 2.79 MIL/uL — ABNORMAL LOW (ref 3.87–5.11)
RDW: 16.1 % — ABNORMAL HIGH (ref 11.5–15.5)
WBC: 6.4 10*3/uL (ref 4.0–10.5)
nRBC: 0 % (ref 0.0–0.2)

## 2021-04-28 LAB — GLUCOSE, CAPILLARY
Glucose-Capillary: 166 mg/dL — ABNORMAL HIGH (ref 70–99)
Glucose-Capillary: 230 mg/dL — ABNORMAL HIGH (ref 70–99)
Glucose-Capillary: 128 mg/dL — ABNORMAL HIGH (ref 70–99)
Glucose-Capillary: 178 mg/dL — ABNORMAL HIGH (ref 70–99)

## 2021-04-28 LAB — SURGICAL PATHOLOGY

## 2021-04-28 MED ORDER — HEPARIN SODIUM (PORCINE) 5000 UNIT/ML IJ SOLN
5000.0000 [IU] | Freq: Three times a day (TID) | INTRAMUSCULAR | Status: DC
  Administered 2021-04-28 – 2021-04-29 (×2): 5000 [IU] via SUBCUTANEOUS
  Filled 2021-04-28 (×2): qty 1

## 2021-04-28 NOTE — Progress Notes (Signed)
Able to wean pt off from 02. Pt's 02 sat at RA>90%. ? ?Chandrea Zellman S Atreus Hasz, RN ? ?

## 2021-04-28 NOTE — Progress Notes (Signed)
Bipap not needed at this time, RT will continue to monitor as needed.

## 2021-04-28 NOTE — Progress Notes (Signed)
HD 3 SUBJECTIVE:   Patient Summary: [name] is a 80 y.o. female (she/her) with a pertinent PMH of ESRD (MWF), atrial fibrillation on Eliquis, T2DM, HLD, HTN, Chronic GI bleeds, malignant lobular carcinoma of right breast status post right lumpectomy and radiation therapy, who presents to [facility] (04/24/2021) with Hb 5.2. [name]'s daughter ([name]) is at bedside to assist with history.   Overnight Events: None   Patient evaluated this AM. Stated that her SOB is much improved, however continues to endorse LLQ tenderness, that is not new. Reported that she has this pain on the day before dialysis.  She associates it with fluid overload status.  She reported reproducible, sternal chest pain, that occurs when she is maneuvering in bed.  She denied associating shortness of breath, denied pain with increased activity, denied pain after eating and laying down, denied pain with inhalation/exhalation. Otherwise she is feeling overall much better, and had no other concerns.  OBJECTIVE:  Vital Signs: Vitals:   04/28/21 0445 04/28/21 0757 04/28/21 1106 04/28/21 1622  BP: (!) 152/79 (!) 155/85 (!) 161/83 (!) 158/84  Pulse: 83 80 87 80  Resp: 19 20 16 20   Temp: 98 F (36.7 C) 98.3 F (36.8 C) 98.1 F (36.7 C) 98.4 F (36.9 C)  TempSrc: Oral Oral Oral Oral  SpO2: 97% 97% 97% 98%  Weight:      Height:       Supplemental O2: [place] SpO2: 98 % O2 Flow Rate (L/min): 1 L/min FiO2 (%): 100 %  Filed Weights   04/27/21 0838 04/27/21 1241 04/27/21 1536  Weight: 65.1 kg 63.1 kg 63.1 kg    Intake/Output Summary (Last 24 hours) at 04/28/2021 1825 Last data filed at 04/28/2021 1200 Gross per 24 hour  Intake 360 ml  Output --  Net 360 ml   Net IO Since Admission: -1,756 mL [04/28/21 1825]  Physical Exam:  Physical Exam Vitals and nursing note reviewed.  Constitutional:      General: She is not in acute distress.    Appearance: She is normal weight.  HENT:      Head: Normocephalic and atraumatic.  Cardiovascular:     Rate and Rhythm: Normal rate and regular rhythm.     Pulses: Normal pulses.     Heart sounds: Normal heart sounds.  Pulmonary:     Effort: Pulmonary effort is normal. No respiratory distress.     Breath sounds: Examination of the right-lower field reveals rales. Examination of the left-lower field reveals rales. Rales present. No wheezing.  Chest:     Chest wall: No tenderness.  Abdominal:     General: There is no distension.     Palpations: Abdomen is soft.     Tenderness: There is no abdominal tenderness.  Musculoskeletal:     Right lower leg: No edema.     Left lower leg: No edema.  Skin:    General: Skin is warm.     Coloration: Skin is pale.  Neurological:     Mental Status: She is alert and oriented to person, place, and time. Mental status is at baseline.     Cranial Nerves: Cranial nerves are intact.     Sensory: Sensation is intact.     Motor: Weakness present.  Psychiatric:        Mood and Affect: Mood normal.        Behavior: Behavior normal.        Thought Content: Thought content normal.  Judgment: Judgment normal.   Pertinent Labs: CBC Latest Ref Rng & Units 04/28/2021 04/27/2021 04/27/2021  WBC 4.0 - 10.5 K/uL 6.4 10.3 10.3  Hemoglobin 12.0 - 15.0 g/dL 7.8(L) 8.2(L) 8.4(L)  Hematocrit 36.0 - 46.0 % 25.4(L) 26.6(L) 26.5(L)  Platelets 150 - 400 K/uL 217 243 228    CMP Latest Ref Rng & Units 04/27/2021 04/26/2021 04/25/2021  Glucose 70 - 99 mg/dL 153(H) 122(H) 136(H)  BUN 8 - 23 mg/dL 60(H) 34(H) 72(H)  Creatinine 0.44 - 1.00 mg/dL 5.29(H) 3.57(H) 5.49(H)  Sodium 135 - 145 mmol/L 135 136 137  Potassium 3.5 - 5.1 mmol/L 4.4 4.7 5.3(H)  Chloride 98 - 111 mmol/L 98 99 97(L)  CO2 22 - 32 mmol/L 24 27 23   Calcium 8.9 - 10.3 mg/dL 8.8(L) 8.8(L) 9.3  Total Protein 6.5 - 8.1 g/dL - - -  Total Bilirubin 0.3 - 1.2 mg/dL - - -  Alkaline Phos 38 - 126 U/L - - -  AST 15 - 41 U/L - - -  ALT 0 - 44 U/L - - -     Recent Labs    04/28/21 0606 04/28/21 1105 04/28/21 1621  GLUCAP 166* 230* 128*     Pertinent Imaging: DG CHEST PORT 1 VIEW  Result Date: 04/28/2021 CLINICAL DATA:  Encounter for hypoxia EXAM: PORTABLE CHEST 1 VIEW COMPARISON:  Radiograph 04/25/2021 FINDINGS: There is a right neck central venous catheter with tip overlying the superior cavoatrial junction, unchanged. There is unchanged enlarged cardiac silhouette. Decreased pulmonary edema with persistent small left pleural effusion and left basilar consolidation. There is no visible pneumothorax. There is no acute osseous abnormality. Thoracic spondylosis. IMPRESSION: Decreased pulmonary edema with persistent small left pleural effusion and left basilar consolidation. Electronically Signed   By: Maurine Wagner M.D.   On: 04/28/2021 10:15    ASSESSMENT/PLAN:  Assessment: Active Problems:   Acute blood loss anemia  [name] is a 80 y.o. female, with pertinent PMHx of ESRD (MWF), atrial fibrillation on Eliquis, gastritis/duodenitis complicated by duodenal ulcer, previous right basal ganglia hemorrhage who presents to the ED with low hemoglobin and admitted for acute blood loss anemia.   Plan: # Upper GI Bleed 2/2 Gastric Antral Vascular Ectasia (GAVE) # Acute Blood Loss Anemia  Likely secondary to GAVE s/p ablation evident on EGD (04/26/2021) which was likely associated with ESRD. S/p 3 units of pRBCs over during of admission. Hb 7.8 (10/4), down from 8.2 the day prior, otherwise stable. - PO Protonix 40 mg BID for 8 weeks, then daily (start 10/2, daily after 12/5) - Sucralfate 1 g QID for 2 weeks (start 10/2, 10/17) - Continue IV iron supplementation with HD - Trend hemoglobin  - Transfuse PRN for hemoglobin < 7 - Continue holding Eliquis  - Will recommend PO iron as outpatient  # Pulmonary Edema  Secondary to missed dialysis prior to arrival and volume overload secondary to transfusions. Improved with dialysis. At baseline  patient, requires no supplemental O2 at home. During HD, SpO2 >90% at room air.  - Continue supplemental oxygen to maintain oxygen saturation > 90%   # ESRD (MWF) Secondary to diabetic nephrosclerosis. Follows with [facility] HD tomorrow to improve patient's .  - Nephrology consulted, appreciate recommendations and assistance. - HD per nephrology  # Post-stroke Recrudescence  # Hx of CVA # Intracranial Atherosclerosis, Severe  Acute neurological deficits on admission secondary to hypoperfusion in the setting of severe anemia. Ultimately led to recrudescence event. Symptoms resolved at this time.   #  Sinus Bradycardia # Hx of Atrial Fibrillation  Currently on Diltiazem and Amiodarone. Anticoagulated with Eliquis. HAS-BLED score of 4.  -Continue to hold Amiodarone and Diltiazem in light of bradycardia and hypotension during HD -Continue to hold Eliquis   # Type 2 Diabetes Mellitus  On Xultophy at home.  -SSI (sensitive) -Trend CBG  # Right Foot Drop  - Ordered AFO - Outpatient neurology follow up recommended  # IBS -Restarted Linzess  Best Practice: Diet: Heart healthy IVF: None,None VTE: None Code: Full Code  PT/OT recs: [place] OT  Prior to Admission Living Arrangement: Home  Anticipated Discharge Location: TBD  Barriers to Discharge: Supplemental O2 requirements, 2/2 hypervolemic state  Dispo: Anticipated discharge in 1-2 days.  Signature: Merrily Brittle, DO Psychiatry Resident, PGY-1 [facility] Pager: [phone] After 5pm on weekdays and 1pm on weekends: On Call pager [phone]  04/28/2021, 6:25 PM

## 2021-04-28 NOTE — Progress Notes (Signed)
[facility] Progress Note   Subjective:  Seen in room, still having some dyspnea, up in chair. No distress.   Objective Vitals:   04/28/21 0001 04/28/21 0445 04/28/21 0757 04/28/21 1106  BP: 139/74 (!) 152/79 (!) 155/85 (!) 161/83  Pulse: 80 83 80 87  Resp: 17 19 20 16   Temp: 98.3 F (36.8 C) 98 F (36.7 C) 98.3 F (36.8 C) 98.1 F (36.7 C)  TempSrc: Oral Oral Oral Oral  SpO2: 98% 97% 97% 97%  Weight:      Height:       Physical Exam General: Well appearing woman, NAD. Nasal O2 Heart: RRR; no murmur Lungs: CTAB Abdomen: soft, non-tender Extremities: No LE edema Dialysis Access: TDC in R chest  Additional Objective Labs: Basic Metabolic Panel: Recent Labs  Lab 04/25/21 0537 04/26/21 0201 04/27/21 0914  NA 137 136 135  K 5.3* 4.7 4.4  CL 97* 99 98  CO2 23 27 24   GLUCOSE 136* 122* 153*  BUN 72* 34* 60*  CREATININE 5.49* 3.57* 5.29*  CALCIUM 9.3 8.8* 8.8*  PHOS 7.5* 5.3* 6.2*    Liver Function Tests: Recent Labs  Lab 04/24/21 1308 04/25/21 0537 04/26/21 0201 04/27/21 0914  AST 17  --   --   --   ALT 14  --   --   --   ALKPHOS 76  --   --   --   BILITOT 0.3  --   --   --   PROT 6.0*  --   --   --   ALBUMIN 2.9* 2.7* 2.3* 2.4*    CBC: Recent Labs  Lab 04/25/21 0205 04/25/21 1656 04/26/21 0201 04/26/21 0809 04/27/21 0111 04/27/21 0914 04/28/21 0123  WBC 9.6 18.2* 12.7*  --  10.3 10.3 6.4  NEUTROABS 8.3*  --  11.3*  --   --   --  5.3  HGB 8.0* 8.4* 6.9*   < > 8.4* 8.2* 7.8*  HCT 26.1* 26.8* 22.4*   < > 26.5* 26.6* 25.4*  MCV 91.9 89.6 90.7  --  89.8 91.4 91.0  PLT 246 270 228  --  228 243 217   < > = values in this interval not displayed.    Medications:    atorvastatin  20 mg Oral QHS   Chlorhexidine Gluconate Cloth  6 each Topical Q0600   insulin aspart  0-6 Units Subcutaneous TID WC   linaclotide  72 mcg Oral QAC supper   pantoprazole  40 mg Oral BID   sodium chloride flush  3 mL Intravenous Q12H   sucralfate  1 g  Oral TID WC & HS    Dialysis: MWF [place]  3.5h  400/500  65.5kg  2/2 bath TDC   Hep none - Mircera 286mcg IV q 2 weeks (last 9/21) - Calcitriol 0.90mcg PO q HD  Assessment/Plan: GI bleed/symptomatic anemia: S/p EGD 10/2 with localized gastric antrum vascular ectasia s/p APC treatment. S/p 3U PRBCs. For PPI BID for 8 weeks. Due for ESA on 10/5. Hypoxia/ SOB- requiring nasal O2, CXR 10/4 shows improving L> R edema. Plan max UF w/ HD tomorrow, cont to lower dry wt.   ESRD: HD MWF.  HD tomorrow.  HTN/volume: BP slightly high, is 2kg under dry wt. Took off 2L on monday here w/ no BP drops. Will continue to lower volume/ dry wt w/ HD tomorrow.  Secondary hyperparathyroidism: CorrCa/Phos good - follow. Nutrition: Alb low, adding protein supplements. A-Fib: Eliquis held on admit, resume per  primary. Diltiazem and amiodarone on hold as well for bracycardia. T2DM  [name], MD 04/28/2021, 11:23 AM

## 2021-04-28 NOTE — Evaluation (Addendum)
Physical Therapy Evaluation Patient Details Name: [name] MRN: [id number] DOB: [birth year] Today's Date: 04/28/2021  History of Present Illness  Pt is a 80 y/o female who presented to ED due to low hemoglobin. EGD on 10/2 revealed upper GI Bleed 2/2 Gastric Antral Vascular Ectasia (GAVE). PMH: ESRD (on HD), a fib, gastritis/duodenitis complicated by duodenal ulcer, previous right basal ganglia hemorrhage.   Clinical Impression  Pt admitted with above diagnosis. PTA pt lived at home with her husband, mod I mobility using RW (until recently when she has been mobilizing in her wheelchair). On eval, pt required mod assist bed mobility, min assist transfers, and min assist ambulation 5' with RW. Deficits noted in strength, balance, and activity tolerance. Pt on 1L O2 on arrival. Mobilized on RA and maintained SpO2 in 90s. 95% on RA in recliner at end of session. Pt does not wear O2 at baseline. Pt will benefit from skilled PT to increase their independence and safety with mobility to allow discharge to the venue listed below.  Pt has all needed DME.         Recommendations for follow up therapy are one component of a multi-disciplinary discharge planning process, led by the attending physician.  Recommendations may be updated based on patient status, additional functional criteria and insurance authorization.  Follow Up Recommendations Home health PT;Supervision/Assistance - 24 hour    Equipment Recommendations  None recommended by PT    Recommendations for Other Services       Precautions / Restrictions Precautions Precautions: Fall;Other (comment) Precaution Comments: monitor O2 (does not wear at baseline), R foot drop, L LE buckling Restrictions Weight Bearing Restrictions: No Other Position/Activity Restrictions: Per ortho tech note, AFO ordered through [facility] 10/3.      Mobility  Bed Mobility Overal bed mobility: Needs Assistance Bed Mobility: Rolling;Sidelying to Sit Rolling:  Min assist Sidelying to sit: Mod assist;HOB elevated       General bed mobility comments: increased time, cues for sequencing    Transfers Overall transfer level: Needs assistance Equipment used: Rolling walker (2 wheeled) Transfers: Sit to/from Stand Sit to Stand: Min assist         General transfer comment: assist to power up, increased time  Ambulation/Gait Ambulation/Gait assistance: Min assist Gait Distance (Feet): 5 Feet Assistive device: Rolling walker (2 wheeled) Gait Pattern/deviations: Step-to pattern;Decreased stride length;Trunk flexed Gait velocity: decreased Gait velocity interpretation: <1.8 ft/sec, indicate of risk for recurrent falls General Gait Details: slow, guarded gait. Assist to maintain balance. Limited by weakness and fatigue.  Stairs            Wheelchair Mobility    Modified Rankin (Stroke Patients Only)       Balance Overall balance assessment: Needs assistance Sitting-balance support: No upper extremity supported;Feet supported Sitting balance-Leahy Scale: Fair     Standing balance support: Bilateral upper extremity supported;During functional activity Standing balance-Leahy Scale: Poor Standing balance comment: reliant on BUE support                             Pertinent Vitals/Pain Pain Assessment: No/denies pain    Home Living Family/patient expects to be discharged to:: Private residence Living Arrangements: Spouse/significant other Available Help at Discharge: Family;Available 24 hours/day Type of Home: House Home Access: Ramped entrance     Home Layout: One level Home Equipment: Shower seat;Hand held shower head;Grab bars - tub/shower;Grab bars - toilet;Hospital bed;Toilet riser;Other (comment);Walker - 2 wheels;Wheelchair - manual  Prior Function Level of Independence: Independent with assistive device(s)         Comments: Reports use of RW for mobility, denies any recent falls. Pt reports  able to complete ADLs (sponge bathing due to HD) and shares IADLs with husband though increasingly difficult due to medical issues. Daughter has been driving pt to/from dialysis. Had been working with HHPT     Hand Dominance   Dominant Hand: Right    Extremity/Trunk Assessment   Upper Extremity Assessment Upper Extremity Assessment: Generalized weakness    Lower Extremity Assessment Lower Extremity Assessment: Generalized weakness    Cervical / Trunk Assessment Cervical / Trunk Assessment: Kyphotic  Communication   Communication: No difficulties  Cognition Arousal/Alertness: Awake/alert Behavior During Therapy: WFL for tasks assessed/performed Overall Cognitive Status: Within Functional Limits for tasks assessed                                        General Comments General comments (skin integrity, edema, etc.): Received on 1L, SpO2 96%. Mobilized on RA with SpO2 maintained in 90s. 95% upon sitting in recliner. Therefore, left pt on RA at end of session. RN notified.    Exercises     Assessment/Plan    PT Assessment Patient needs continued PT services  PT Problem List Decreased strength;Decreased mobility;Decreased activity tolerance;Decreased balance;Cardiopulmonary status limiting activity       PT Treatment Interventions Therapeutic activities;Gait training;Therapeutic exercise;Patient/family education;Balance training;Functional mobility training    PT Goals (Current goals can be found in the Care Plan section)  Acute Rehab PT Goals Patient Stated Goal: go home, wean O2 PT Goal Formulation: With patient Time For Goal Achievement: 04/28/21 Potential to Achieve Goals: Good    Frequency Min 3X/week   Barriers to discharge        Co-evaluation               AM-PAC PT "6 Clicks" Mobility  Outcome Measure Help needed turning from your back to your side while in a flat bed without using bedrails?: A Little Help needed moving from lying  on your back to sitting on the side of a flat bed without using bedrails?: A Lot Help needed moving to and from a bed to a chair (including a wheelchair)?: A Little Help needed standing up from a chair using your arms (e.g., wheelchair or bedside chair)?: A Little Help needed to walk in hospital room?: A Little Help needed climbing 3-5 steps with a railing? : A Lot 6 Click Score: 16    End of Session Equipment Utilized During Treatment: Gait belt;Oxygen Activity Tolerance: Patient tolerated treatment well Patient left: in chair;with call bell/phone within reach;with chair alarm set Nurse Communication: Mobility status PT Visit Diagnosis: Muscle weakness (generalized) (M62.81);Difficulty in walking, not elsewhere classified (R26.2)    Time: 2426-8341 PT Time Calculation (min) (ACUTE ONLY): 21 min   Charges:   PT Evaluation $PT Eval Moderate Complexity: 1 Mod          Lorrin Goodell, PT  Office # [phone] Pager [phone]   Lorriane Shire 04/28/2021, 11:38 AM

## 2021-04-29 LAB — RENAL FUNCTION PANEL
Albumin: 2.3 g/dL — ABNORMAL LOW (ref 3.5–5.0)
Anion gap: 14 (ref 5–15)
BUN: 61 mg/dL — ABNORMAL HIGH (ref 8–23)
CO2: 25 mmol/L (ref 22–32)
Calcium: 9.4 mg/dL (ref 8.9–10.3)
Chloride: 99 mmol/L (ref 98–111)
Creatinine, Ser: 5.69 mg/dL — ABNORMAL HIGH (ref 0.44–1.00)
GFR, Estimated: 7 mL/min — ABNORMAL LOW (ref >60)
Glucose, Bld: 159 mg/dL — ABNORMAL HIGH (ref 70–99)
Phosphorus: 5.2 mg/dL — ABNORMAL HIGH (ref 2.5–4.6)
Potassium: 5.1 mmol/L (ref 3.5–5.1)
Sodium: 138 mmol/L (ref 135–145)

## 2021-04-29 LAB — GLUCOSE, CAPILLARY
Glucose-Capillary: 158 mg/dL — ABNORMAL HIGH (ref 70–99)
Glucose-Capillary: 139 mg/dL — ABNORMAL HIGH (ref 70–99)
Glucose-Capillary: 200 mg/dL — ABNORMAL HIGH (ref 70–99)

## 2021-04-29 LAB — CBC
HCT: 28.6 % — ABNORMAL LOW (ref 36.0–46.0)
Hemoglobin: 8.6 g/dL — ABNORMAL LOW (ref 12.0–15.0)
MCH: 27.6 pg (ref 26.0–34.0)
MCHC: 30.1 g/dL (ref 30.0–36.0)
MCV: 91.7 fL (ref 80.0–100.0)
Platelets: 258 10*3/uL (ref 150–400)
RBC: 3.12 MIL/uL — ABNORMAL LOW (ref 3.87–5.11)
RDW: 16.2 % — ABNORMAL HIGH (ref 11.5–15.5)
WBC: 6.9 10*3/uL (ref 4.0–10.5)
nRBC: 0 % (ref 0.0–0.2)

## 2021-04-29 MED ORDER — SUCRALFATE 1 GM/10ML PO SUSP: 1.0000 g | Freq: Three times a day (TID) | ORAL | 0 refills | Status: AC

## 2021-04-29 MED ORDER — PANTOPRAZOLE SODIUM 40 MG PO TBEC: 40.0000 mg | DELAYED_RELEASE_TABLET | Freq: Two times a day (BID) | ORAL | 0 refills | Status: AC

## 2021-04-29 NOTE — Discharge Summary (Signed)
Name: KEILLY FATULA MRN: [id number] DOB: 03-27-1941 80 y.o. PCP: [name], MD  Date of Admission: 04/24/2021 12:47 PM Date of Discharge:  04/29/2021 Attending Physician: Dr. Jimmye Norman  DISCHARGE DIAGNOSIS:  Primary Problem: Acute blood loss anemia   Hospital Problems: Principal Problem:   Acute blood loss anemia Active Problems:   DM type 2 (diabetes mellitus, type 2) (HCC)   Upper GI bleed   Atrial fibrillation (HCC)   ESRD on hemodialysis ([place])   Acute pulmonary edema (HCC)    DISCHARGE MEDICATIONS:   Allergies as of 04/29/2021   No Known Allergies      Medication List     STOP taking these medications    diltiazem 120 MG 24 hr capsule Commonly known as: CARDIZEM CD   Eliquis 2.5 MG Tabs tablet Generic drug: apixaban   letrozole 2.5 MG tablet Commonly known as: FEMARA       TAKE these medications    amiodarone 200 MG tablet Commonly known as: PACERONE Take 1 tablet (200 mg total) by mouth daily.   ascorbic acid 500 MG tablet Commonly known as: VITAMIN C Take 500 mg by mouth See admin instructions. Take one tablet (500 mg) by mouth on Sunday, Tuesday, Thursday, Saturday mornings (non-dialysis days); take one tablet (500 mg) daily with supper   atorvastatin 20 MG tablet Commonly known as: LIPITOR Take 20 mg by mouth at bedtime.   Insulin Degludec-Liraglutide 100-3.6 UNIT-MG/ML Sopn Commonly known as: XULTOPHY Inject 16 Units into the skin every morning.   linaclotide 72 MCG capsule Commonly known as: LINZESS Take 72 mcg by mouth daily before supper.   pantoprazole 40 MG tablet Commonly known as: PROTONIX Take 1 tablet (40 mg total) by mouth 2 (two) times daily. What changed:  when to take this additional instructions   sevelamer carbonate 800 MG tablet Commonly known as: RENVELA Take 800 mg by mouth See admin instructions. Take one tablet (800 mg) by mouth up to three times daily with meals   sucralfate 1 GM/10ML suspension Commonly  known as: CARAFATE Take 10 mLs (1 g total) by mouth 4 (four) times daily -  with meals and at bedtime for 12 days. Start taking on: May 11, 2021   Vitamin D3 25 MCG (1000 UT) Caps Take 1,000 Units by mouth See admin instructions. Take one tablet (1000 mg) by mouth on Sunday, Tuesday, Thursday, Saturday mornings (non-dialysis days); take one tablet (1000 mg) daily with supper   zinc sulfate 220 (50 Zn) MG capsule Take 220 mg by mouth See admin instructions. Take one tablet (220 mg) by mouth on Sunday, Tuesday, Thursday, Saturday mornings (non-dialysis days); take one tablet (220 mg) daily with supper        DISPOSITION AND FOLLOW-UP:  Ms.Joshlynn S Havel was discharged from [facility] in Stable condition. At the hospital follow up visit please address:  Follow-up Recommendations: Consults: Nephrology, Gastroenterology Labs: CBC (Hb), iron panel  Medications: STOP Diltiazem and eliquis; start protonix 40 mg twice daily for 8 weeks, followed by once daily; and carafate four times daily until 10/17  Follow-up Appointments:  [place], [facility]. Go to.   Why: Please continue going as you were, for dialysis on Monday, Wednesday, and Friday Contact information: [address] [place] [address] [phone]         [name], MD Follow up in 1 week(s).   Specialty: Family Medicine Why: For medicine management and evaluation  of your atrial fibrillation.  Will see if you need to be restarted on your home diltiazem, amiodarone, and Eliquis. Contact information: [place] [address] [phone]         Orth[facility], [facility] Follow up in 1 week(s).   Why: Please make an appointment to see them, to pick up the AFO (aka foot boot) for the right foot drop. Contact information: [place] [address] [place] COURSE:  Patient  Summary: Instructions Please follow up with your primary care doctor in regards to your hemoglobin level and management of your medicines Abdominal pain and inflammation of your stomach lining Please continue to take Protonix 40 mg twice a day for 8 weeks (in date 06/29/2021).  Then transition to Protonix 40 mg once a day thereafter. Please continue to take Sulcrate 1 g 4 times daily for 2 weeks (end date 05/11/2021) Do not take Eliquis, for your risk of bleeding in your stomach, that is most likely causing your low hemoglobin. Please resume it, after your primary care provider assess you.   #Upper GI Bleed 2/2 Gastric Antral Vascular Ectasia (GAVE) #Acute Blood Loss Anemia  Patient initially presented with fatigue and dizziness and was found to have a low Hb while at dialysis, and was thus instructed to go to the ED. FOBT positive. GI bleed likely secondary to GAVE s/p ablation evident on EGD (04/26/2021) which was likely associated with ESRD. S/p 3 units of pRBCs over during of admission. Hb remained stable on the day of discharge and the patient clinically improved. Started on PO Protonix 40 mg BID for 8 weeks, then switch to once daily beginning on 12/5. Also started on Sucralfate 1 g QID for 2 weeks, with stop date of 10/17. Held Eliquis during hospitalization secondary to bleeding. Patient's HAS-BLED score is higher than her CHADSVASc score, thus will hold Eliquis until the patient follows up with her PCP.   #Pulmonary Edema  Secondary to missed dialysis prior to arrival and volume overload secondary to multiple blood transfusions. The patient clinically improved with dialysis. She did not require any supplemental O2 after 10/4 and denied any further shortness of breath.   #ESRD (MWF) Secondary to diabetic nephrosclerosis. Follows with [facility]. Patient remained on schedule with dialysis while hospitalized and remained stable.  #Post-stroke Recrudescence  #Hx of CVA #Intracranial  Atherosclerosis, Severe  Acute neurological deficits on admission likely secondary to hypoperfusion in the setting of severe anemia. Code stroke was initially called and imaging revealed no hemorrhage or acute infarct. Ultimately the patient was believed to have a recrudescence event. Symptoms resolved and remained stable throughout the course of her admission. Discharged with home health PT/OT.   #Sinus Bradycardia #Hx of Atrial Fibrillation  Currently on Diltiazem and Amiodarone and anticoagulated with Eliquis at home. HAS-BLED score of 4. STOP diltiazem on discharge secondary to episodes of hypotension and bradycardia. Also discontinued Eliquis secondary to patient's recent GI bleed and should discuss plans to resume this with PCP.    DISCHARGE INSTRUCTIONS:   Discharge Instructions     (HEART FAILURE PATIENTS) Call MD:  Anytime you have any of the following symptoms: 1) 3 pound weight gain in 24 hours or 5 pounds in 1 week 2) shortness of breath, with or without a dry hacking cough 3) swelling in the hands, feet or stomach 4) if you have to sleep on  extra pillows at night in order to breathe.   Complete by: As directed    Call MD for:  difficulty breathing, headache or visual disturbances   Complete by: As directed    Call MD for:  difficulty breathing, headache or visual disturbances   Complete by: As directed    Call MD for:  extreme fatigue   Complete by: As directed    Call MD for:  hives   Complete by: As directed    Call MD for:  persistant dizziness or light-headedness   Complete by: As directed    Call MD for:  persistant dizziness or light-headedness   Complete by: As directed    Call MD for:  persistant nausea and vomiting   Complete by: As directed    Call MD for:  redness, tenderness, or signs of infection (pain, swelling, redness, odor or green/yellow discharge around incision site)   Complete by: As directed    Call MD for:  severe uncontrolled pain   Complete by: As  directed    Call MD for:  temperature >100.4   Complete by: As directed    Diet - low sodium heart healthy   Complete by: As directed    Diet Carb Modified   Complete by: As directed    Discharge instructions   Complete by: As directed    See discharge summary   Home Health   Complete by: As directed    To provide the following care/treatments:  OT PT     Increase activity slowly   Complete by: As directed    Increase activity slowly   Complete by: As directed        SUBJECTIVE:  This is hospital day 4 for [name] who was seen and evaluated while at dialysis this morning. She states she feels very well today and is ready to go home. Her breathing is improved and she states that she has not required any supplemental O2 in >24 hours. Had 2.5 L off during dialysis today. She notes that she does still have some discomfort in the middle of her chest, worse with deep breathing. Discussed this at length with the patient, and ultimately believe her pain could have been secondary to BiPAP usage and overinflation of her lungs. Patient denies any melena or hematochezia at this time, as well.   Discharge Vitals:   BP 126/65   Pulse 65   Temp 98.5 F (36.9 C) (Oral)   Resp 14   Ht 5\' 3"  (1.6 m)   Wt 59.2 kg   SpO2 94%   BMI 23.13 kg/m   OBJECTIVE:  General: Pleasant, well-appearing female laying in bed. No acute distress. Head: Normocephalic. Atraumatic. CV: RRR. No murmurs, rubs, or gallops. No LE edema Pulmonary: Lungs CTAB. Normal effort. No wheezing or rales. Abdominal: Soft, nontender, nondistended. Normal bowel sounds. Extremities: Palpable radial and DP pulses. Normal ROM. Skin: Warm and dry. No obvious rash or lesions. Neuro: A&Ox3. Moves all extremities, although generalized weakness. Normal sensation. Psych: Normal mood and affect   Pertinent Labs, Studies, and Procedures:  CBC Latest Ref Rng & Units 04/29/2021 04/28/2021 04/27/2021  WBC 4.0 - 10.5 K/uL 6.9 6.4  10.3  Hemoglobin 12.0 - 15.0 g/dL 8.6(L) 7.8(L) 8.2(L)  Hematocrit 36.0 - 46.0 % 28.6(L) 25.4(L) 26.6(L)  Platelets 150 - 400 K/uL 258 217 243    CMP Latest Ref Rng & Units 04/29/2021 04/27/2021 04/26/2021  Glucose 70 - 99 mg/dL 159(H) 153(H) 122(H)  BUN  8 - 23 mg/dL 61(H) 60(H) 34(H)  Creatinine 0.44 - 1.00 mg/dL 5.69(H) 5.29(H) 3.57(H)  Sodium 135 - 145 mmol/L 138 135 136  Potassium 3.5 - 5.1 mmol/L 5.1 4.4 4.7  Chloride 98 - 111 mmol/L 99 98 99  CO2 22 - 32 mmol/L 25 24 27   Calcium 8.9 - 10.3 mg/dL 9.4 8.8(L) 8.8(L)  Total Protein 6.5 - 8.1 g/dL - - -  Total Bilirubin 0.3 - 1.2 mg/dL - - -  Alkaline Phos 38 - 126 U/L - - -  AST 15 - 41 U/L - - -  ALT 0 - 44 U/L - - -    CT ANGIO HEAD NECK W WO CM  Result Date: 04/24/2021 CLINICAL DATA:  Focal neuro deficit, > 6 hrs, stroke suspected. EXAM: CT ANGIOGRAPHY HEAD AND NECK TECHNIQUE: Multidetector CT imaging of the head and neck was performed using the standard protocol during bolus administration of intravenous contrast. Multiplanar CT image reconstructions and MIPs were obtained to evaluate the vascular anatomy. Carotid stenosis measurements (when applicable) are obtained utilizing NASCET criteria, using the distal internal carotid diameter as the denominator. CONTRAST:  48mL OMNIPAQUE IOHEXOL 350 MG/ML SOLN COMPARISON:  11/07/2020 FINDINGS: CTA NECK FINDINGS Aortic arch: Standard 3 vessel aortic arch with a moderate amount of calcified plaque. No significant arch vessel origin stenosis. Right carotid system: Patent with predominantly calcified plaque at the carotid bifurcation and in the proximal ICA resulting in less than 50% stenosis. Left carotid system: Patent with predominantly calcified plaque at the carotid bifurcation resulting in less than 50% stenosis. Subtle beading of the mid to distal cervical ICA, similar to the prior CTA and which could reflect mild changes of fibromuscular dysplasia. Vertebral arteries: Patent and codominant  with calcified plaque resulting in unchanged mild stenosis of the right vertebral origin. Focal moderate narrowing of the left vertebral artery at the C1-2 level which may be related to asymmetrically severe left lateral C1-2 arthropathy with prominent spurring as well as kinking of the vessel. Normal caliber of the left vertebral artery proximal and distal to this without a visible dissection flap. Skeleton: Advanced median and left lateral C1-2 arthropathy. Advanced multilevel cervical facet arthrosis. Other neck: No evidence of cervical lymphadenopathy or mass. Upper chest: Small bilateral pleural effusions. Partially visualized right jugular venous catheter. Review of the MIP images confirms the above findings CTA HEAD FINDINGS Anterior circulation: The internal carotid arteries are patent from skull base to carotid termini with similar appearance of moderate bilateral paraclinoid stenoses. ACAs and MCAs are patent without evidence of a proximal branch occlusion. Severe mid to distal right M1 and distal left M1/proximal M2 stenoses are similar to the prior CTA as well. No aneurysm is identified. Posterior circulation: The intracranial vertebral arteries are patent to the basilar with atherosclerotic irregularity bilaterally but no high-grade stenosis. The basilar artery is widely patent. The PCAs are patent with similar appearance of severe bilateral P2 stenoses. No aneurysm is identified. Venous sinuses: As permitted by contrast timing, patent. Anatomic variants: None. Review of the MIP images confirms the above findings IMPRESSION: 1. No large vessel occlusion. 2. Similar appearance of advanced intracranial atherosclerosis with multiple moderate to severe anterior and posterior circulation stenoses as above. 3. Cervical carotid atherosclerosis without significant stenosis. 4. Mild right vertebral artery origin stenosis. 5.  Aortic Atherosclerosis (ICD10-I70.0). These results were communicated to Dr.  Leonel Ramsay at 6:27 pm on 04/24/2021 by text page via the [facility] messaging system. Electronically Signed   By: Seymour Bars.D.  On: 04/24/2021 18:42   DG CHEST PORT 1 VIEW  Result Date: 04/25/2021 CLINICAL DATA:  Encounter for shortness of breath EXAM: PORTABLE CHEST 1 VIEW COMPARISON:  01/09/2021 FINDINGS: Cardiopericardial enlargement. Dialysis catheter on the right with tip at the upper cavoatrial junction. Diffuse interstitial and airspace opacity with [employer]. Left pleural effusion. The retrocardiac lung is either opacified or obscured. IMPRESSION: Pulmonary edema and left pleural effusion. Electronically Signed   By: Jorje Guild M.D.   On: 04/25/2021 06:32   CT HEAD CODE STROKE WO CONTRAST`  Result Date: 04/24/2021 CLINICAL DATA:  Code stroke.  Neuro deficit, acute, stroke suspected EXAM: CT HEAD WITHOUT CONTRAST TECHNIQUE: Contiguous axial images were obtained from the base of the skull through the vertex without intravenous contrast. COMPARISON:  CT Head 07/16/2020. FINDINGS: Brain: No evidence of acute large vascular territory infarction, hemorrhage, hydrocephalus, extra-axial collection or mass lesion/mass effect. Moderate patchy white matter hypoattenuation, nonspecific but compatible with chronic microvascular ischemic disease. Remote right cerebellar infarct. Vascular: No hyperdense vessel identified. Calcific intracranial atherosclerosis. Skull: No acute fracture. Sinuses/Orbits: Visualized sinuses are clear. No acute orbital abnormality. Other: Trace right mastoid effusion. ASPECTS [facility] Stroke Program Early CT Score) total score (0-10 with 10 being normal): 10. IMPRESSION: 1. No evidence of acute large vascular territory infarct or acute hemorrhage. ASPECTS is 10. 2. Moderate chronic microvascular ischemic disease and remote right cerebellar infarct. Code stroke imaging results were communicated on 04/24/2021 at 5:57 pm to provider Dr. Leonel Ramsay via telephone, who verbally  acknowledged these results. Electronically Signed   By: Margaretha Sheffield M.D.   On: 04/24/2021 17:59     Signed: Buddy Duty, D.O.  Internal Medicine Resident, PGY-1 [facility]  Pager: [phone] 3:34 PM, 04/29/2021

## 2021-04-29 NOTE — Progress Notes (Addendum)
OT Cancellation Note  Patient Details Name: [name] MRN: [id number] DOB: Oct 07, 1940   Cancelled Treatment:    Reason Eval/Treat Not Completed: Patient at procedure or test/ unavailable Pt off unit for HD this AM. Will check back for OT session as schedule permits.  Re-attempted around 2:30PM with pt back on unit from dialysis. Pt currently eating lunch. Provided energy conservation handout for pt to review. Will follow up for OT session, likely tomorrow.   Layla Maw 04/29/2021, 9:55 AM

## 2021-04-29 NOTE — Progress Notes (Addendum)
[facility] Progress Note   Subjective:  Seen on dialysis, no new c/o's. Off of O2. Late BP drop, 2.5 L off , tolerated well.   Objective Vitals:   04/29/21 1130 04/29/21 1200 04/29/21 1230 04/29/21 1458  BP: 130/71 122/64 (!) 84/45 126/65  Pulse: 76 73 65   Resp:    14  Temp:      TempSrc:      SpO2:      Weight:      Height:       Physical Exam General: Well appearing woman, NAD. Nasal O2 Heart: RRR; no murmur Lungs: CTAB Abdomen: soft, non-tender Extremities: No LE edema Dialysis Access: TDC in R chest  Additional Objective Labs: Basic Metabolic Panel: Recent Labs  Lab 04/26/21 0201 04/27/21 0914 04/29/21 0642  NA 136 135 138  K 4.7 4.4 5.1  CL 99 98 99  CO2 27 24 25   GLUCOSE 122* 153* 159*  BUN 34* 60* 61*  CREATININE 3.57* 5.29* 5.69*  CALCIUM 8.8* 8.8* 9.4  PHOS 5.3* 6.2* 5.2*    Liver Function Tests: Recent Labs  Lab 04/24/21 1308 04/25/21 0537 04/26/21 0201 04/27/21 0914 04/29/21 0642  AST 17  --   --   --   --   ALT 14  --   --   --   --   ALKPHOS 76  --   --   --   --   BILITOT 0.3  --   --   --   --   PROT 6.0*  --   --   --   --   ALBUMIN 2.9*   < > 2.3* 2.4* 2.3*   < > = values in this interval not displayed.    CBC: Recent Labs  Lab 04/25/21 0205 04/25/21 1656 04/26/21 0201 04/26/21 0809 04/27/21 0111 04/27/21 0914 04/28/21 0123 04/29/21 0642  WBC 9.6   < > 12.7*  --  10.3 10.3 6.4 6.9  NEUTROABS 8.3*  --  11.3*  --   --   --  5.3  --   HGB 8.0*   < > 6.9*   < > 8.4* 8.2* 7.8* 8.6*  HCT 26.1*   < > 22.4*   < > 26.5* 26.6* 25.4* 28.6*  MCV 91.9   < > 90.7  --  89.8 91.4 91.0 91.7  PLT 246   < > 228  --  228 243 217 258   < > = values in this interval not displayed.    Medications:    atorvastatin  20 mg Oral QHS   Chlorhexidine Gluconate Cloth  6 each Topical Q0600   heparin  5,000 Units Subcutaneous Q8H   insulin aspart  0-6 Units Subcutaneous TID WC   linaclotide  72 mcg Oral QAC supper    pantoprazole  40 mg Oral BID   sodium chloride flush  3 mL Intravenous Q12H   sucralfate  1 g Oral TID WC & HS    Dialysis: MWF [place]  3.5h  400/500  65.5kg  2/2 bath TDC   Hep none - Mircera 229mcg IV q 2 weeks (last 9/21) - Calcitriol 0.25mcg PO q HD  Assessment/Plan: GI bleed/symptomatic anemia: S/p EGD 10/2 with localized gastric antrum vascular ectasia s/p APC treatment. S/p 3U PRBCs. For PPI BID for 8 weeks. Due for ESA on 10/5. Hypoxia/ SOB/ volume- requiring nasal O2, CXR 10/4 showed improving L> R edema. Took off 2L on monday and 2.5 L today.Marland Kitchen Has lost  body wt. Lower dry wt to 90kg ESRD: HD MWF.  HD this am.    HTN: BP's stable Secondary hyperparathyroidism: CorrCa/Phos good - follow. Nutrition: Alb low, adding protein supplements. A-Fib: Eliquis held on admit, resume per primary. Diltiazem and amiodarone on hold as well for bracycardia. T2DM  [name], MD 04/29/2021, 3:08 PM

## 2021-04-29 NOTE — Plan of Care (Signed)
  Problem: Education: Goal: Knowledge of General Education information will improve Description Including pain rating scale, medication(s)/side effects and non-pharmacologic comfort measures Outcome: Progressing   

## 2021-04-29 NOTE — TOC Transition Note (Signed)
Transition of Care [facility]) - CM/SW Discharge Note   Patient Details  Name: [name] MRN: [id number] Date of Birth: [birth year]  [facility] CM/SW Contact:  Cyndi Bender, RN Phone Number: 04/29/2021, 4:00 PM   Clinical Narrative:   Patient stable for discharge. Orders for PT and OT. Choice provided Per CMS guidelines from medicare.gov website with star ratings (copy placed in shadow chart) Patient has used [facility] in the past. Patient requested to use same agency. Patient requesting daughter, Amy, be called for hh apt day and time. Spoke to [place] with [facility] and she accepted the referral for therapy to start Tuesday 10\11/22. Patient has dialysis M\W/F. Patient in agreement. Verified address, phone and PCP.  Amy [phone]    Final next level of care: [place] Barriers to Discharge: Barriers Resolved   Patient Goals and CMS Choice Patient states their goals for this hospitalization and ongoing recovery are:: return home   Choice offered to / list presented to : Patient  Discharge Placement             Home with Home Health          Discharge Plan and Services   Discharge Planning Services: CM Consult Post Acute Care Choice: Home Health                    HH Arranged: PT, OT [facility] Agency: [place] Date [place]: 04/29/21 Time [place]: 1340 Representative spoke with at [place]: Peter ([place]) Interventions     Readmission Risk Interventions Readmission Risk Prevention Plan 04/29/2021 04/29/2021  Transportation Screening Complete Complete  PCP or Specialist Appt within 3-5 Days Complete -  HRI or Home Care Consult Complete -  Social Work Consult for [place] Planning/Counseling Complete -  Palliative Care Screening Not Applicable -  Medication Review Press photographer) Complete -  Some recent data might be hidden

## 2021-04-29 NOTE — Plan of Care (Signed)
  Problem: Health Behavior/Discharge Planning: Goal: Ability to manage health-related needs will improve Outcome: Progressing   Problem: Clinical Measurements: Goal: Respiratory complications will improve Outcome: Progressing Goal: Cardiovascular complication will be avoided Outcome: Progressing

## 2021-04-30 ENCOUNTER — Telehealth (HOSPITAL_COMMUNITY): Payer: Self-pay | Admitting: Nephrology

## 2021-04-30 DIAGNOSIS — N186 End stage renal disease: Secondary | ICD-10-CM | POA: Diagnosis not present

## 2021-04-30 DIAGNOSIS — Z992 Dependence on renal dialysis: Secondary | ICD-10-CM | POA: Diagnosis not present

## 2021-04-30 DIAGNOSIS — E1122 Type 2 diabetes mellitus with diabetic chronic kidney disease: Secondary | ICD-10-CM | POA: Diagnosis not present

## 2021-04-30 NOTE — Telephone Encounter (Signed)
Transition of care contact from inpatient facility  Date of discharge: 04/29/21 Date of contact: 04/30/21 Method: Phone Spoke to: Patient  Patient contacted to discuss transition of care from recent inpatient hospitalization. Patient was admitted to [facility] from 9/30 - 04/29/21 with discharge diagnosis of GIB, acute blood loss anemia, and pulm edema/resp failure.  Medication changes were reviewed. Off diltiazem and Eliquis and on PPI  Patient will follow up with his/her outpatient HD unit on: tomorrow (per MWF schedule). Her dry weight was lowered about 5kg this admit - hopefully that will work for her.  Veneta Penton, PA-C [employer] Pager [phone]

## 2021-05-01 DIAGNOSIS — N2581 Secondary hyperparathyroidism of renal origin: Secondary | ICD-10-CM | POA: Diagnosis not present

## 2021-05-01 DIAGNOSIS — N186 End stage renal disease: Secondary | ICD-10-CM | POA: Diagnosis not present

## 2021-05-01 DIAGNOSIS — Z992 Dependence on renal dialysis: Secondary | ICD-10-CM | POA: Diagnosis not present

## 2021-05-04 ENCOUNTER — Emergency Department (HOSPITAL_COMMUNITY)
Admission: EM | Admit: 2021-05-04 | Discharge: 2021-05-04 | Disposition: A | Discharge status: Home / Self Care | Payer: Medicare HMO | Attending: Emergency Medicine | Admitting: Emergency Medicine

## 2021-05-04 ENCOUNTER — Telehealth: Payer: Self-pay

## 2021-05-04 ENCOUNTER — Encounter (HOSPITAL_COMMUNITY): Payer: Self-pay

## 2021-05-04 ENCOUNTER — Other Ambulatory Visit: Payer: Self-pay

## 2021-05-04 DIAGNOSIS — I959 Hypotension, unspecified: Secondary | ICD-10-CM | POA: Diagnosis not present

## 2021-05-04 DIAGNOSIS — R531 Weakness: Secondary | ICD-10-CM | POA: Diagnosis not present

## 2021-05-04 DIAGNOSIS — Z992 Dependence on renal dialysis: Secondary | ICD-10-CM | POA: Diagnosis not present

## 2021-05-04 DIAGNOSIS — Z5321 Procedure and treatment not carried out due to patient leaving prior to being seen by health care provider: Secondary | ICD-10-CM | POA: Diagnosis not present

## 2021-05-04 DIAGNOSIS — R4020 Unspecified coma: Secondary | ICD-10-CM | POA: Diagnosis not present

## 2021-05-04 DIAGNOSIS — N186 End stage renal disease: Secondary | ICD-10-CM | POA: Diagnosis not present

## 2021-05-04 DIAGNOSIS — N2581 Secondary hyperparathyroidism of renal origin: Secondary | ICD-10-CM | POA: Diagnosis not present

## 2021-05-04 DIAGNOSIS — D649 Anemia, unspecified: Secondary | ICD-10-CM | POA: Insufficient documentation

## 2021-05-04 DIAGNOSIS — R5383 Other fatigue: Secondary | ICD-10-CM | POA: Diagnosis not present

## 2021-05-04 LAB — CBC WITH DIFFERENTIAL/PLATELET
Abs Immature Granulocytes: 0.09 10*3/uL — ABNORMAL HIGH (ref 0.00–0.07)
Basophils Absolute: 0 10*3/uL (ref 0.0–0.1)
Basophils Relative: 0 %
Eosinophils Absolute: 0.1 10*3/uL (ref 0.0–0.5)
Eosinophils Relative: 1 %
HCT: 28.1 % — ABNORMAL LOW (ref 36.0–46.0)
Hemoglobin: 8.4 g/dL — ABNORMAL LOW (ref 12.0–15.0)
Immature Granulocytes: 1 %
Lymphocytes Relative: 6 %
Lymphs Abs: 0.6 10*3/uL — ABNORMAL LOW (ref 0.7–4.0)
MCH: 27.5 pg (ref 26.0–34.0)
MCHC: 29.9 g/dL — ABNORMAL LOW (ref 30.0–36.0)
MCV: 91.8 fL (ref 80.0–100.0)
Monocytes Absolute: 0.9 10*3/uL (ref 0.1–1.0)
Monocytes Relative: 9 %
Neutro Abs: 8.2 10*3/uL — ABNORMAL HIGH (ref 1.7–7.7)
Neutrophils Relative %: 83 %
Platelets: 364 10*3/uL (ref 150–400)
RBC: 3.06 MIL/uL — ABNORMAL LOW (ref 3.87–5.11)
RDW: 17.1 % — ABNORMAL HIGH (ref 11.5–15.5)
WBC: 9.9 10*3/uL (ref 4.0–10.5)
nRBC: 0 % (ref 0.0–0.2)

## 2021-05-04 LAB — COMPREHENSIVE METABOLIC PANEL
ALT: 29 U/L (ref 0–44)
AST: 19 U/L (ref 15–41)
Albumin: 2.5 g/dL — ABNORMAL LOW (ref 3.5–5.0)
Alkaline Phosphatase: 105 U/L (ref 38–126)
Anion gap: 15 (ref 5–15)
BUN: 21 mg/dL (ref 8–23)
CO2: 25 mmol/L (ref 22–32)
Calcium: 8.6 mg/dL — ABNORMAL LOW (ref 8.9–10.3)
Chloride: 98 mmol/L (ref 98–111)
Creatinine, Ser: 3.05 mg/dL — ABNORMAL HIGH (ref 0.44–1.00)
GFR, Estimated: 15 mL/min — ABNORMAL LOW (ref >60)
Glucose, Bld: 154 mg/dL — ABNORMAL HIGH (ref 70–99)
Potassium: 3.4 mmol/L — ABNORMAL LOW (ref 3.5–5.1)
Sodium: 138 mmol/L (ref 135–145)
Total Bilirubin: 0.5 mg/dL (ref 0.3–1.2)
Total Protein: 6.5 g/dL (ref 6.5–8.1)

## 2021-05-04 LAB — TYPE AND SCREEN
ABO/RH(D): O POS
Antibody Screen: NEGATIVE

## 2021-05-04 NOTE — ED Notes (Signed)
Informed by lobby staff that pt has left

## 2021-05-04 NOTE — ED Triage Notes (Signed)
BIB EMS after dialysis center called out for bp 70SBP received 1L NS from dialysis center and BP increased to 122SBP.  Was admitted last week for anemia.

## 2021-05-04 NOTE — Telephone Encounter (Signed)
Left VM for patient's daughter Amy to give me a call back to schedule patient for a Office visit.

## 2021-05-04 NOTE — ED Notes (Signed)
Pt left and said if she starts to feel worse she will come back

## 2021-05-04 NOTE — ED Provider Notes (Signed)
**Note Karen-Identified via Obfuscation** Emergency Medicine Provider Triage Evaluation Note  [name] , a 80 y.o. female  was evaluated in triage.  Pt complains of generalized fatigue.  States that she had dialysis session done earlier today she felt somewhat fatigued and was told that her blood pressure dropped during her dialysis.  They provided her over 1 L of normal saline and her blood pressure improved.  Seems that she has a history of anemia.   Review of Systems  Positive: Fatigue Negative: Fever  Physical Exam  BP 133/66   Pulse 82   Temp 97.9 F (36.6 C) (Oral)   Resp 18   SpO2 94%  Gen:   Awake, no distress   Resp:  Normal effort  MSK:   Moves extremities without difficulty  Other:  pale  Medical Decision Making  Medically screening exam initiated at 6:37 PM.  Appropriate orders placed.  DEARI Wagner was informed that the remainder of the evaluation will be completed by another provider, this initial triage assessment does not replace that evaluation, and the importance of remaining in the ED until their evaluation is complete.     [name], [place] 05/04/21 1839    Karen Gambler, MD 05/05/21 1530

## 2021-05-05 ENCOUNTER — Encounter: Payer: Self-pay | Admitting: Gastroenterology

## 2021-05-06 DIAGNOSIS — Z992 Dependence on renal dialysis: Secondary | ICD-10-CM | POA: Diagnosis not present

## 2021-05-06 DIAGNOSIS — N186 End stage renal disease: Secondary | ICD-10-CM | POA: Diagnosis not present

## 2021-05-06 DIAGNOSIS — N2581 Secondary hyperparathyroidism of renal origin: Secondary | ICD-10-CM | POA: Diagnosis not present

## 2021-05-07 DIAGNOSIS — J9601 Acute respiratory failure with hypoxia: Secondary | ICD-10-CM | POA: Diagnosis not present

## 2021-05-07 DIAGNOSIS — I120 Hypertensive chronic kidney disease with stage 5 chronic kidney disease or end stage renal disease: Secondary | ICD-10-CM | POA: Diagnosis not present

## 2021-05-07 DIAGNOSIS — E1122 Type 2 diabetes mellitus with diabetic chronic kidney disease: Secondary | ICD-10-CM | POA: Diagnosis not present

## 2021-05-07 DIAGNOSIS — K922 Gastrointestinal hemorrhage, unspecified: Secondary | ICD-10-CM | POA: Diagnosis not present

## 2021-05-07 DIAGNOSIS — D500 Iron deficiency anemia secondary to blood loss (chronic): Secondary | ICD-10-CM | POA: Diagnosis not present

## 2021-05-07 DIAGNOSIS — I480 Paroxysmal atrial fibrillation: Secondary | ICD-10-CM | POA: Diagnosis not present

## 2021-05-07 DIAGNOSIS — N186 End stage renal disease: Secondary | ICD-10-CM | POA: Diagnosis not present

## 2021-05-07 DIAGNOSIS — E785 Hyperlipidemia, unspecified: Secondary | ICD-10-CM | POA: Diagnosis not present

## 2021-05-07 DIAGNOSIS — D631 Anemia in chronic kidney disease: Secondary | ICD-10-CM | POA: Diagnosis not present

## 2021-05-08 DIAGNOSIS — N186 End stage renal disease: Secondary | ICD-10-CM | POA: Diagnosis not present

## 2021-05-08 DIAGNOSIS — N2581 Secondary hyperparathyroidism of renal origin: Secondary | ICD-10-CM | POA: Diagnosis not present

## 2021-05-08 DIAGNOSIS — Z992 Dependence on renal dialysis: Secondary | ICD-10-CM | POA: Diagnosis not present

## 2021-05-11 DIAGNOSIS — Z992 Dependence on renal dialysis: Secondary | ICD-10-CM | POA: Diagnosis not present

## 2021-05-11 DIAGNOSIS — N2581 Secondary hyperparathyroidism of renal origin: Secondary | ICD-10-CM | POA: Diagnosis not present

## 2021-05-11 DIAGNOSIS — N186 End stage renal disease: Secondary | ICD-10-CM | POA: Diagnosis not present

## 2021-05-12 DIAGNOSIS — I480 Paroxysmal atrial fibrillation: Secondary | ICD-10-CM | POA: Diagnosis not present

## 2021-05-12 DIAGNOSIS — I120 Hypertensive chronic kidney disease with stage 5 chronic kidney disease or end stage renal disease: Secondary | ICD-10-CM | POA: Diagnosis not present

## 2021-05-12 DIAGNOSIS — E785 Hyperlipidemia, unspecified: Secondary | ICD-10-CM | POA: Diagnosis not present

## 2021-05-12 DIAGNOSIS — D500 Iron deficiency anemia secondary to blood loss (chronic): Secondary | ICD-10-CM | POA: Diagnosis not present

## 2021-05-12 DIAGNOSIS — D631 Anemia in chronic kidney disease: Secondary | ICD-10-CM | POA: Diagnosis not present

## 2021-05-12 DIAGNOSIS — E1122 Type 2 diabetes mellitus with diabetic chronic kidney disease: Secondary | ICD-10-CM | POA: Diagnosis not present

## 2021-05-12 DIAGNOSIS — K922 Gastrointestinal hemorrhage, unspecified: Secondary | ICD-10-CM | POA: Diagnosis not present

## 2021-05-12 DIAGNOSIS — N186 End stage renal disease: Secondary | ICD-10-CM | POA: Diagnosis not present

## 2021-05-12 DIAGNOSIS — J9601 Acute respiratory failure with hypoxia: Secondary | ICD-10-CM | POA: Diagnosis not present

## 2021-05-13 DIAGNOSIS — N186 End stage renal disease: Secondary | ICD-10-CM | POA: Diagnosis not present

## 2021-05-13 DIAGNOSIS — Z992 Dependence on renal dialysis: Secondary | ICD-10-CM | POA: Diagnosis not present

## 2021-05-13 DIAGNOSIS — N2581 Secondary hyperparathyroidism of renal origin: Secondary | ICD-10-CM | POA: Diagnosis not present

## 2021-05-14 DIAGNOSIS — I120 Hypertensive chronic kidney disease with stage 5 chronic kidney disease or end stage renal disease: Secondary | ICD-10-CM | POA: Diagnosis not present

## 2021-05-14 DIAGNOSIS — D631 Anemia in chronic kidney disease: Secondary | ICD-10-CM | POA: Diagnosis not present

## 2021-05-14 DIAGNOSIS — N186 End stage renal disease: Secondary | ICD-10-CM | POA: Diagnosis not present

## 2021-05-14 DIAGNOSIS — E785 Hyperlipidemia, unspecified: Secondary | ICD-10-CM | POA: Diagnosis not present

## 2021-05-14 DIAGNOSIS — I480 Paroxysmal atrial fibrillation: Secondary | ICD-10-CM | POA: Diagnosis not present

## 2021-05-14 DIAGNOSIS — K922 Gastrointestinal hemorrhage, unspecified: Secondary | ICD-10-CM | POA: Diagnosis not present

## 2021-05-14 DIAGNOSIS — D500 Iron deficiency anemia secondary to blood loss (chronic): Secondary | ICD-10-CM | POA: Diagnosis not present

## 2021-05-14 DIAGNOSIS — E1122 Type 2 diabetes mellitus with diabetic chronic kidney disease: Secondary | ICD-10-CM | POA: Diagnosis not present

## 2021-05-14 DIAGNOSIS — J9601 Acute respiratory failure with hypoxia: Secondary | ICD-10-CM | POA: Diagnosis not present

## 2021-05-15 DIAGNOSIS — Z992 Dependence on renal dialysis: Secondary | ICD-10-CM | POA: Diagnosis not present

## 2021-05-15 DIAGNOSIS — N2581 Secondary hyperparathyroidism of renal origin: Secondary | ICD-10-CM | POA: Diagnosis not present

## 2021-05-15 DIAGNOSIS — N186 End stage renal disease: Secondary | ICD-10-CM | POA: Diagnosis not present

## 2021-05-18 DIAGNOSIS — Z992 Dependence on renal dialysis: Secondary | ICD-10-CM | POA: Diagnosis not present

## 2021-05-18 DIAGNOSIS — N2581 Secondary hyperparathyroidism of renal origin: Secondary | ICD-10-CM | POA: Diagnosis not present

## 2021-05-18 DIAGNOSIS — N186 End stage renal disease: Secondary | ICD-10-CM | POA: Diagnosis not present

## 2021-05-19 DIAGNOSIS — J9601 Acute respiratory failure with hypoxia: Secondary | ICD-10-CM | POA: Diagnosis not present

## 2021-05-19 DIAGNOSIS — D631 Anemia in chronic kidney disease: Secondary | ICD-10-CM | POA: Diagnosis not present

## 2021-05-19 DIAGNOSIS — I120 Hypertensive chronic kidney disease with stage 5 chronic kidney disease or end stage renal disease: Secondary | ICD-10-CM | POA: Diagnosis not present

## 2021-05-19 DIAGNOSIS — D500 Iron deficiency anemia secondary to blood loss (chronic): Secondary | ICD-10-CM | POA: Diagnosis not present

## 2021-05-19 DIAGNOSIS — E785 Hyperlipidemia, unspecified: Secondary | ICD-10-CM | POA: Diagnosis not present

## 2021-05-19 DIAGNOSIS — I480 Paroxysmal atrial fibrillation: Secondary | ICD-10-CM | POA: Diagnosis not present

## 2021-05-19 DIAGNOSIS — N186 End stage renal disease: Secondary | ICD-10-CM | POA: Diagnosis not present

## 2021-05-19 DIAGNOSIS — K922 Gastrointestinal hemorrhage, unspecified: Secondary | ICD-10-CM | POA: Diagnosis not present

## 2021-05-19 DIAGNOSIS — E1122 Type 2 diabetes mellitus with diabetic chronic kidney disease: Secondary | ICD-10-CM | POA: Diagnosis not present

## 2021-05-20 DIAGNOSIS — Z992 Dependence on renal dialysis: Secondary | ICD-10-CM | POA: Diagnosis not present

## 2021-05-20 DIAGNOSIS — N186 End stage renal disease: Secondary | ICD-10-CM | POA: Diagnosis not present

## 2021-05-20 DIAGNOSIS — N2581 Secondary hyperparathyroidism of renal origin: Secondary | ICD-10-CM | POA: Diagnosis not present

## 2021-05-21 ENCOUNTER — Other Ambulatory Visit: Payer: Self-pay | Admitting: Internal Medicine

## 2021-05-21 DIAGNOSIS — E785 Hyperlipidemia, unspecified: Secondary | ICD-10-CM | POA: Diagnosis not present

## 2021-05-21 DIAGNOSIS — I480 Paroxysmal atrial fibrillation: Secondary | ICD-10-CM | POA: Diagnosis not present

## 2021-05-21 DIAGNOSIS — I120 Hypertensive chronic kidney disease with stage 5 chronic kidney disease or end stage renal disease: Secondary | ICD-10-CM | POA: Diagnosis not present

## 2021-05-21 DIAGNOSIS — D631 Anemia in chronic kidney disease: Secondary | ICD-10-CM | POA: Diagnosis not present

## 2021-05-21 DIAGNOSIS — J9601 Acute respiratory failure with hypoxia: Secondary | ICD-10-CM | POA: Diagnosis not present

## 2021-05-21 DIAGNOSIS — K922 Gastrointestinal hemorrhage, unspecified: Secondary | ICD-10-CM | POA: Diagnosis not present

## 2021-05-21 DIAGNOSIS — N186 End stage renal disease: Secondary | ICD-10-CM | POA: Diagnosis not present

## 2021-05-21 DIAGNOSIS — E1122 Type 2 diabetes mellitus with diabetic chronic kidney disease: Secondary | ICD-10-CM | POA: Diagnosis not present

## 2021-05-21 DIAGNOSIS — D500 Iron deficiency anemia secondary to blood loss (chronic): Secondary | ICD-10-CM | POA: Diagnosis not present

## 2021-05-22 DIAGNOSIS — N2581 Secondary hyperparathyroidism of renal origin: Secondary | ICD-10-CM | POA: Diagnosis not present

## 2021-05-22 DIAGNOSIS — N186 End stage renal disease: Secondary | ICD-10-CM | POA: Diagnosis not present

## 2021-05-22 DIAGNOSIS — Z992 Dependence on renal dialysis: Secondary | ICD-10-CM | POA: Diagnosis not present

## 2021-05-25 DIAGNOSIS — Z992 Dependence on renal dialysis: Secondary | ICD-10-CM | POA: Diagnosis not present

## 2021-05-25 DIAGNOSIS — N186 End stage renal disease: Secondary | ICD-10-CM | POA: Diagnosis not present

## 2021-05-25 DIAGNOSIS — N2581 Secondary hyperparathyroidism of renal origin: Secondary | ICD-10-CM | POA: Diagnosis not present

## 2021-05-26 DIAGNOSIS — J9601 Acute respiratory failure with hypoxia: Secondary | ICD-10-CM | POA: Diagnosis not present

## 2021-05-26 DIAGNOSIS — R404 Transient alteration of awareness: Secondary | ICD-10-CM | POA: Diagnosis not present

## 2021-05-26 DIAGNOSIS — I440 Atrioventricular block, first degree: Secondary | ICD-10-CM | POA: Diagnosis not present

## 2021-05-26 DIAGNOSIS — E785 Hyperlipidemia, unspecified: Secondary | ICD-10-CM | POA: Diagnosis not present

## 2021-05-26 DIAGNOSIS — K922 Gastrointestinal hemorrhage, unspecified: Secondary | ICD-10-CM | POA: Diagnosis not present

## 2021-05-26 DIAGNOSIS — R110 Nausea: Secondary | ICD-10-CM | POA: Diagnosis not present

## 2021-05-26 DIAGNOSIS — R739 Hyperglycemia, unspecified: Secondary | ICD-10-CM | POA: Diagnosis not present

## 2021-05-26 DIAGNOSIS — I480 Paroxysmal atrial fibrillation: Secondary | ICD-10-CM | POA: Diagnosis not present

## 2021-05-26 DIAGNOSIS — D500 Iron deficiency anemia secondary to blood loss (chronic): Secondary | ICD-10-CM | POA: Diagnosis not present

## 2021-05-26 DIAGNOSIS — R001 Bradycardia, unspecified: Secondary | ICD-10-CM | POA: Diagnosis not present

## 2021-05-26 DIAGNOSIS — I499 Cardiac arrhythmia, unspecified: Secondary | ICD-10-CM | POA: Diagnosis not present

## 2021-05-26 DIAGNOSIS — E1122 Type 2 diabetes mellitus with diabetic chronic kidney disease: Secondary | ICD-10-CM | POA: Diagnosis not present

## 2021-05-26 DIAGNOSIS — R0689 Other abnormalities of breathing: Secondary | ICD-10-CM | POA: Diagnosis not present

## 2021-05-26 DIAGNOSIS — D631 Anemia in chronic kidney disease: Secondary | ICD-10-CM | POA: Diagnosis not present

## 2021-05-26 DIAGNOSIS — I469 Cardiac arrest, cause unspecified: Secondary | ICD-10-CM | POA: Diagnosis not present

## 2021-05-26 DIAGNOSIS — R011 Cardiac murmur, unspecified: Secondary | ICD-10-CM | POA: Diagnosis not present

## 2021-05-26 DIAGNOSIS — I120 Hypertensive chronic kidney disease with stage 5 chronic kidney disease or end stage renal disease: Secondary | ICD-10-CM | POA: Diagnosis not present

## 2021-05-26 DIAGNOSIS — I959 Hypotension, unspecified: Secondary | ICD-10-CM | POA: Diagnosis not present

## 2021-05-26 DIAGNOSIS — N186 End stage renal disease: Secondary | ICD-10-CM | POA: Diagnosis not present

## 2021-05-26 DIAGNOSIS — E875 Hyperkalemia: Secondary | ICD-10-CM | POA: Diagnosis not present

## 2021-06-25 DEATH — deceased

## 2021-12-06 IMAGING — MR MR MRA HEAD W/O CM
1 series · 20 of 48 positions shown · non-contrast
Comparison: Prior head CT from 07/12/2020 as well as previous brain
MRI from 08/17/2016.

CLINICAL DATA: Follow-up examination for acute stroke.

EXAM:
MRI HEAD WITHOUT CONTRAST
MRA HEAD WITHOUT CONTRAST
TECHNIQUE: Multiplanar, multiecho pulse sequences of the brain and surrounding
structures were obtained without intravenous contrast. Angiographic
images of the head were obtained using MRA technique without
contrast.

[Series 5: 3d cow · axial · 0.5mm · 0.41mm/px · z∈[-83,-2]mm · 20 of 172 slices shown]
[im 1/172]
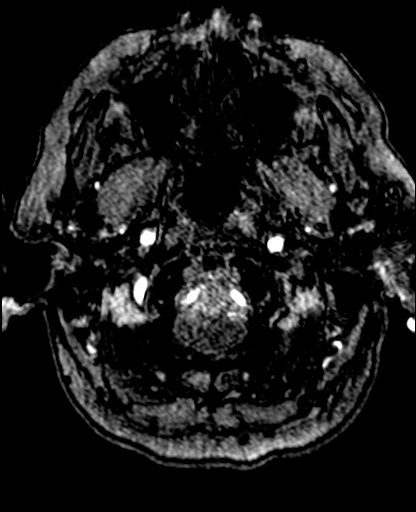
[im 4/172]
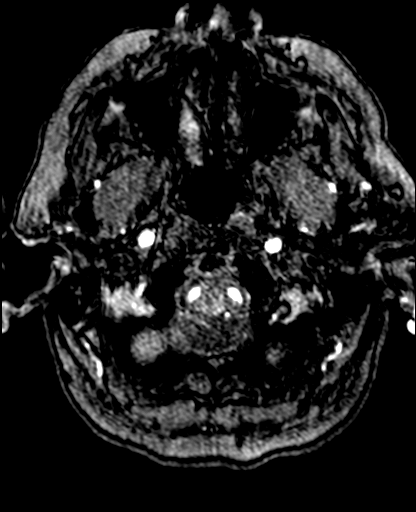
[im 8/172]
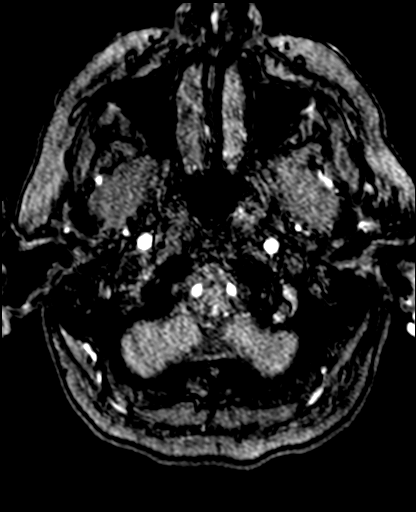
[im 11/172]
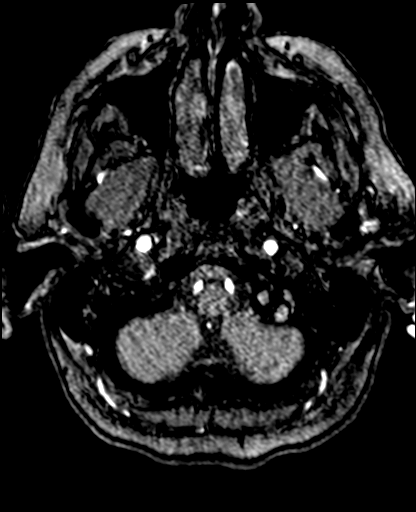
[im 15/172]
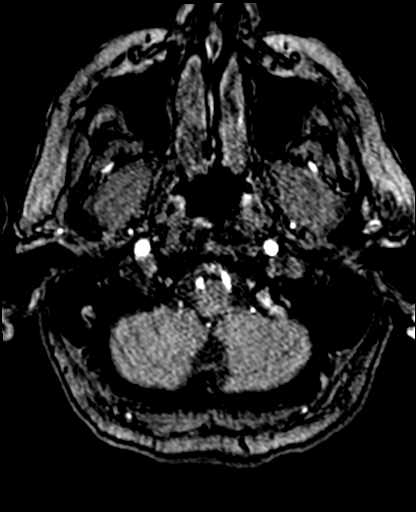
[im 19/172]
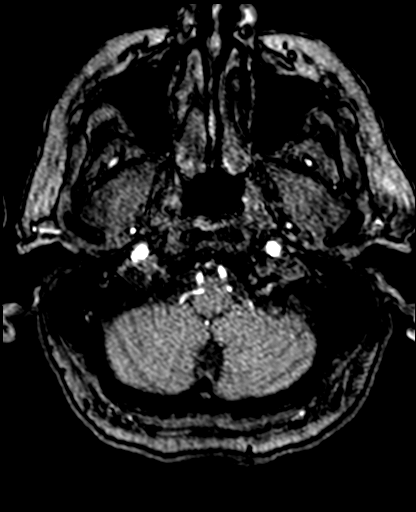
[im 22/172]
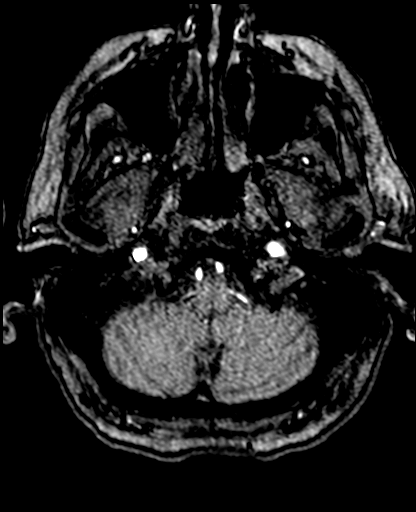
[im 26/172]
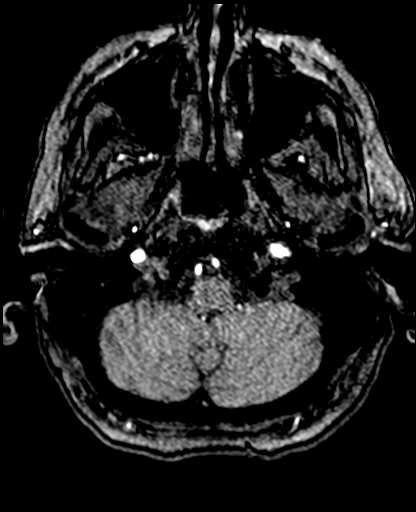
[im 30/172]
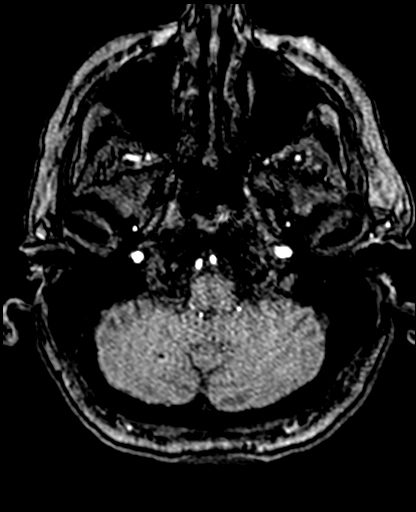
[im 33/172]
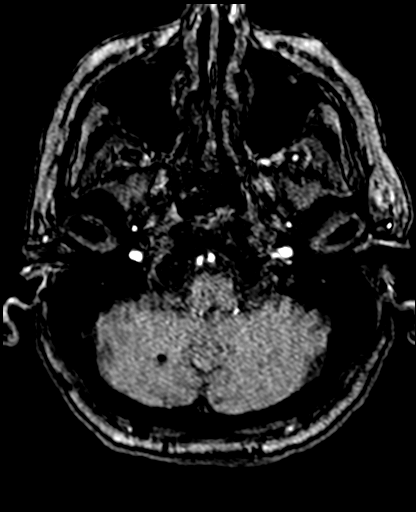
[im 37/172]
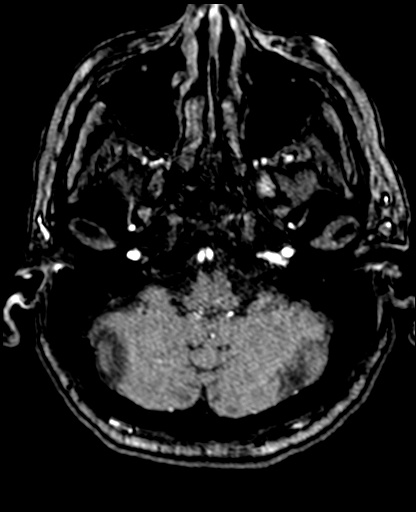
[im 41/172]
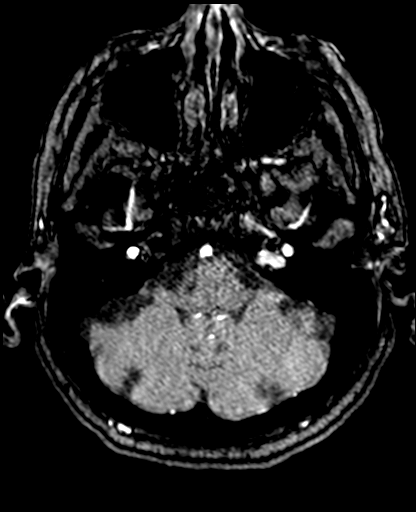
[im 55/172]
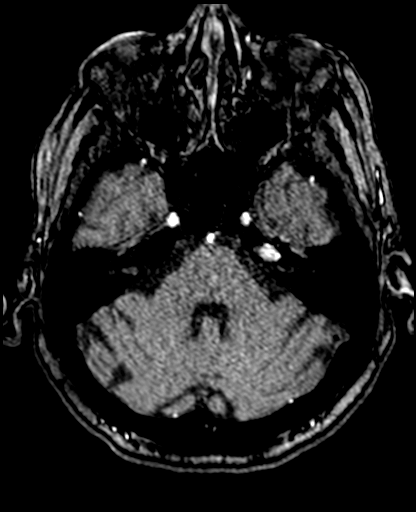
[im 77/172]
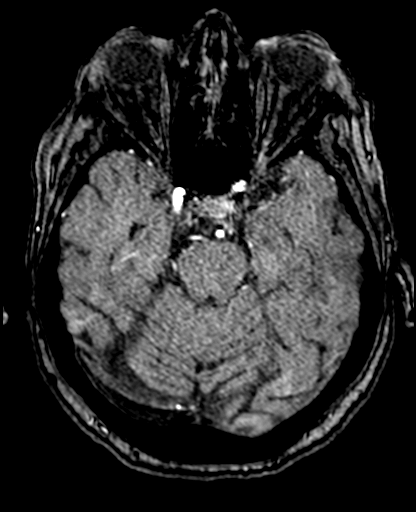
[im 88/172]
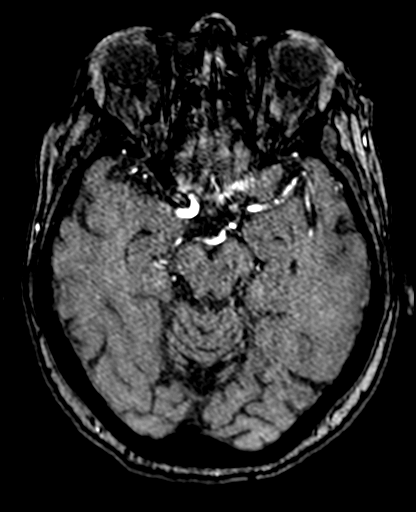
[im 99/172]
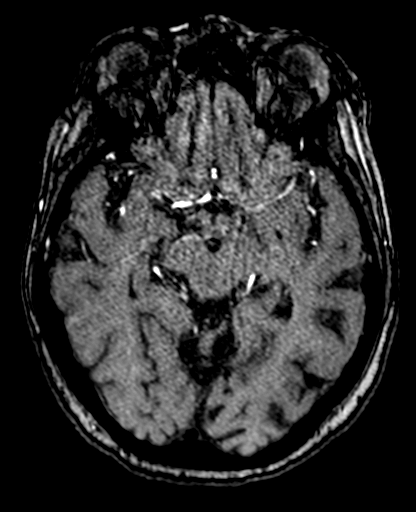
[im 121/172]
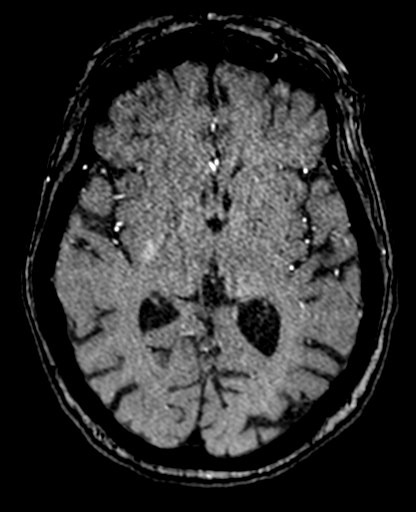
[im 142/172]
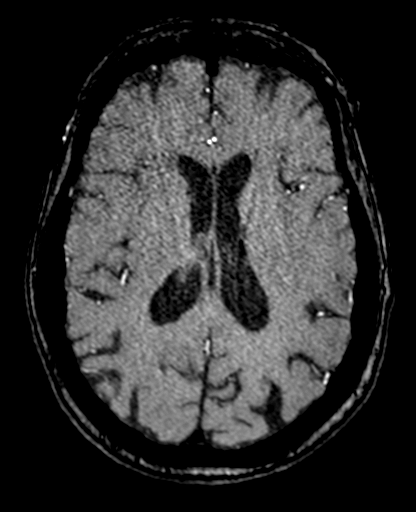
[im 146/172]
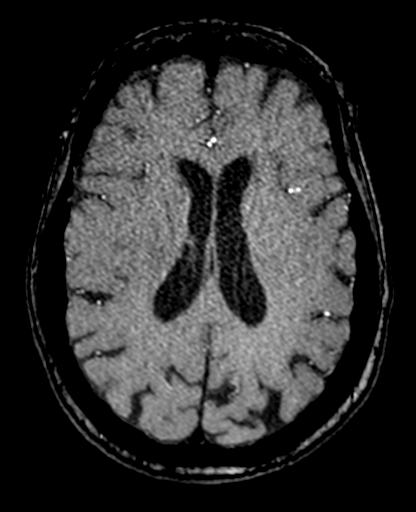
[im 164/172]
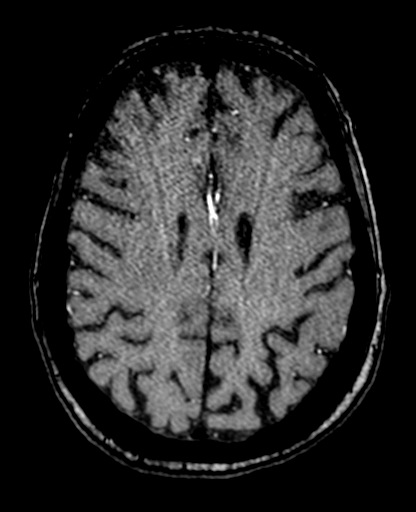

[20 of 48 positions shown; findings below may reference images not displayed]

FINDINGS: MRI HEAD FINDINGS

Brain: Diffuse prominence of the CSF containing spaces compatible
with generalized age-related cerebral atrophy. Patchy and confluent
T2/FLAIR hyperintensity within the periventricular and deep white
matter both cerebral hemispheres most likely related chronic
microvascular ischemic disease. Patchy involvement of the pons.
Overall, these changes are mildly progressed as compared to 5681.
Few small remote lacunar type infarcts noted about the basal
ganglia, most notable which present at the right caudothalamic
groove.

Previously identified hemorrhage centered at the posterior right
lentiform nucleus/right internal capsule again seen, stable in size
measuring up to approximately 1 cm in greatest dimension. This is
somewhat difficult to visualized by MR, and is most notable on T1
weighted sequence (series 16, image 26). No significant surrounding
edema or regional mass effect. No other acute intracranial
hemorrhage. Few scattered chronic micro hemorrhages noted at the
right cerebellum and pons, likely small vessel/hypertensive in
nature.

Single punctate 4 mm focus of diffusion abnormality involving the
subcortical white matter of the right parietal corona radiata noted,
likely a small acute to subacute small vessel type ischemic infarct
(series 5, image 74). No associated hemorrhage or mass effect. No
other diffusion abnormality to suggest acute or subacute ischemia.
Gray-white matter differentiation otherwise maintained.

No mass lesion, mass effect, or midline shift. No hydrocephalus or
extra-axial fluid collection. Pituitary gland suprasellar region
normal. Midline structures intact.

1.3 cm ovoid well-circumscribed lesion positioned at the left
petrous apex demonstrating heterogeneous T2/FLAIR signal intensity
with hyperintense T1 signal intensity noted (series 16, image 14),
nonspecific, but could reflect a small cholesterol granuloma versus
asymmetric fatty marrow. Finding is relatively stable from previous
MRI from 5681, and of doubtful significance.

Vascular: Major intracranial vascular flow voids are maintained.

Skull and upper cervical spine: Craniocervical junction within
normal limits. Multilevel degenerative spondylosis noted within the
visualized upper cervical spine with resultant mild-to-moderate
spinal stenosis. Bone marrow signal intensity within normal limits.
No scalp soft tissue abnormality.

Sinuses/Orbits: Patient status post bilateral ocular lens
replacement. Globes and orbital soft tissues demonstrate no acute
finding. Mild scattered mucosal thickening noted throughout the
paranasal sinuses. Trace bilateral mastoid effusions, of doubtful
significance. Visualized nasopharynx within normal limits.

Other: None.

MRA HEAD FINDINGS

ANTERIOR CIRCULATION:

Examination degraded by motion artifact.

Visualized distal cervical segments of the internal carotid arteries
are patent with symmetric antegrade flow. Petrous segments widely
patent. Atheromatous irregularity throughout the carotid siphons
with associated mild multifocal narrowing. A1 segments patent
bilaterally. Normal anterior communicating artery complex. Anterior
cerebral arteries patent to their distal aspects without high-grade
stenosis.

Short-segment severe stenosis at the distal left M1 segment/left MCA
bifurcation (series 1977, image 12). Left MCA branches are diffusely
irregular but perfused distally.

Right M1 segment patent proximally. Focal signal cutoff at the
mid-distal right M1 segment into the bifurcation (series 1977, image
11). This likely reflects a severe high-grade stenosis, as the right
MCA branches are perfused distally. Diffuse atheromatous
irregularity seen throughout the right MCA branches as well.
Proximally.

POSTERIOR CIRCULATION:

Vertebral arteries largely code dominant and patent to the
vertebrobasilar junction. Both PICA origins patent and normal.
Basilar patent to its distal aspect without stenosis. Superior
cerebral arteries patent bilaterally. Both PCAs primarily supplied
via the basilar. Severe long segment stenosis involving the
mid-distal right P2 segment (series 3304, image 15). Severe
multifocal distal left P2 and P3 stenoses present as well (series
3304, image 14). PCAs remain perfused to their distal aspects.

No intracranial aneurysm or other vascular abnormality.
IMPRESSION: MRI HEAD IMPRESSION:

1. Unchanged 1 cm hemorrhage centered at the posterior right
lentiform nucleus/right internal capsule. No significant surrounding
edema or regional mass effect.
2. 4 mm focus of diffusion abnormality involving the subcortical
white matter of the right parietal corona radiata, consistent with a
small acute to subacute small vessel type ischemic infarct. No
associated hemorrhage.
3. Underlying age-related cerebral atrophy with moderate chronic
microvascular ischemic disease, mildly progressed as compared to
5681.

MRA HEAD IMPRESSION:

1. Technically limited exam due to motion artifact.
2. Negative intracranial MRA for large vessel occlusion.
3. Focal severe high-grade stenoses involving the mid-distal right
M1 segment and distal left M1 segment/left MCA bifurcation. Left MCA
branches remain perfused distally.
4. Severe multifocal bilateral P2 and distal left P3 stenoses as
above.
